# Patient Record
Sex: Female | Born: 1950 | Race: Black or African American | Hispanic: No | Marital: Single | Smoking: Never smoker
Health system: Southern US, Community
[De-identification: ages and names within clinical notes are randomized; demographics above are authoritative.]

## PROBLEM LIST (undated history)

## (undated) DIAGNOSIS — I739 Peripheral vascular disease, unspecified: Secondary | ICD-10-CM

## (undated) DIAGNOSIS — N390 Urinary tract infection, site not specified: Secondary | ICD-10-CM

## (undated) DIAGNOSIS — K219 Gastro-esophageal reflux disease without esophagitis: Secondary | ICD-10-CM

## (undated) DIAGNOSIS — I10 Essential (primary) hypertension: Secondary | ICD-10-CM

## (undated) DIAGNOSIS — E669 Obesity, unspecified: Secondary | ICD-10-CM

## (undated) DIAGNOSIS — E119 Type 2 diabetes mellitus without complications: Secondary | ICD-10-CM

## (undated) DIAGNOSIS — G5600 Carpal tunnel syndrome, unspecified upper limb: Secondary | ICD-10-CM

## (undated) DIAGNOSIS — M719 Bursopathy, unspecified: Secondary | ICD-10-CM

## (undated) DIAGNOSIS — M779 Enthesopathy, unspecified: Secondary | ICD-10-CM

## (undated) DIAGNOSIS — G43909 Migraine, unspecified, not intractable, without status migrainosus: Secondary | ICD-10-CM

## (undated) DIAGNOSIS — M1990 Unspecified osteoarthritis, unspecified site: Secondary | ICD-10-CM

## (undated) DIAGNOSIS — J189 Pneumonia, unspecified organism: Secondary | ICD-10-CM

## (undated) DIAGNOSIS — M5430 Sciatica, unspecified side: Secondary | ICD-10-CM

## (undated) HISTORY — DX: Enthesopathy, unspecified: M77.9

## (undated) HISTORY — DX: Gastro-esophageal reflux disease without esophagitis: K21.9

## (undated) HISTORY — DX: Essential (primary) hypertension: I10

## (undated) HISTORY — PX: GANGLION CYST EXCISION: SHX1691

## (undated) HISTORY — DX: Obesity, unspecified: E66.9

## (undated) HISTORY — DX: Unspecified osteoarthritis, unspecified site: M19.90

## (undated) HISTORY — PX: UTERINE FIBROID SURGERY: SHX826

---
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
## 1993-07-25 HISTORY — PX: PLANTAR FASCIA RELEASE: SHX2239

## 1998-03-26 ENCOUNTER — Emergency Department (HOSPITAL_COMMUNITY): Admission: EM | Admit: 1998-03-26 | Discharge: 1998-03-26 | Payer: Self-pay | Admitting: Emergency Medicine

## 1998-06-22 ENCOUNTER — Other Ambulatory Visit: Admission: RE | Admit: 1998-06-22 | Discharge: 1998-06-22 | Payer: Self-pay | Admitting: *Deleted

## 1999-06-12 ENCOUNTER — Emergency Department (HOSPITAL_COMMUNITY): Admission: EM | Admit: 1999-06-12 | Discharge: 1999-06-12 | Payer: Self-pay

## 1999-06-12 ENCOUNTER — Encounter: Payer: Self-pay | Admitting: Emergency Medicine

## 1999-07-21 ENCOUNTER — Other Ambulatory Visit: Admission: RE | Admit: 1999-07-21 | Discharge: 1999-07-21 | Payer: Self-pay | Admitting: *Deleted

## 2000-08-03 ENCOUNTER — Other Ambulatory Visit: Admission: RE | Admit: 2000-08-03 | Discharge: 2000-08-03 | Payer: Self-pay | Admitting: Orthopedic Surgery

## 2000-11-30 ENCOUNTER — Other Ambulatory Visit: Admission: RE | Admit: 2000-11-30 | Discharge: 2000-11-30 | Payer: Self-pay | Admitting: *Deleted

## 2001-02-01 ENCOUNTER — Other Ambulatory Visit: Admission: RE | Admit: 2001-02-01 | Discharge: 2001-02-01 | Payer: Self-pay | Admitting: *Deleted

## 2001-07-25 HISTORY — PX: KNEE ARTHROSCOPY: SHX127

## 2002-05-01 ENCOUNTER — Other Ambulatory Visit: Admission: RE | Admit: 2002-05-01 | Discharge: 2002-05-01 | Payer: Self-pay | Admitting: *Deleted

## 2002-05-28 ENCOUNTER — Ambulatory Visit: Admission: RE | Admit: 2002-05-28 | Discharge: 2002-05-28 | Payer: Self-pay | Admitting: Gynecology

## 2002-05-28 ENCOUNTER — Encounter (INDEPENDENT_AMBULATORY_CARE_PROVIDER_SITE_OTHER): Payer: Self-pay | Admitting: Specialist

## 2003-02-18 ENCOUNTER — Encounter: Payer: Self-pay | Admitting: *Deleted

## 2003-02-18 ENCOUNTER — Encounter: Admission: RE | Admit: 2003-02-18 | Discharge: 2003-02-18 | Payer: Self-pay | Admitting: *Deleted

## 2004-04-16 ENCOUNTER — Emergency Department (HOSPITAL_COMMUNITY): Admission: EM | Admit: 2004-04-16 | Discharge: 2004-04-16 | Payer: Self-pay | Admitting: Emergency Medicine

## 2004-06-03 ENCOUNTER — Ambulatory Visit: Payer: Self-pay | Admitting: Family Medicine

## 2004-06-15 ENCOUNTER — Ambulatory Visit: Payer: Self-pay | Admitting: Family Medicine

## 2004-07-06 ENCOUNTER — Ambulatory Visit: Payer: Self-pay | Admitting: Family Medicine

## 2004-07-21 ENCOUNTER — Ambulatory Visit: Payer: Self-pay | Admitting: Pulmonary Disease

## 2004-08-09 ENCOUNTER — Ambulatory Visit (HOSPITAL_COMMUNITY): Admission: RE | Admit: 2004-08-09 | Discharge: 2004-08-09 | Payer: Self-pay | Admitting: Pulmonary Disease

## 2004-08-13 ENCOUNTER — Ambulatory Visit: Payer: Self-pay | Admitting: Pulmonary Disease

## 2004-09-17 ENCOUNTER — Ambulatory Visit: Payer: Self-pay | Admitting: Family Medicine

## 2004-09-28 ENCOUNTER — Ambulatory Visit: Payer: Self-pay | Admitting: Family Medicine

## 2004-09-29 ENCOUNTER — Ambulatory Visit: Payer: Self-pay | Admitting: Pulmonary Disease

## 2004-11-09 ENCOUNTER — Ambulatory Visit: Payer: Self-pay | Admitting: Pulmonary Disease

## 2005-01-01 ENCOUNTER — Ambulatory Visit: Payer: Self-pay | Admitting: Family Medicine

## 2005-01-11 ENCOUNTER — Ambulatory Visit: Payer: Self-pay | Admitting: Family Medicine

## 2005-02-16 ENCOUNTER — Ambulatory Visit: Payer: Self-pay | Admitting: Pulmonary Disease

## 2005-03-08 ENCOUNTER — Ambulatory Visit: Payer: Self-pay | Admitting: Family Medicine

## 2005-07-28 ENCOUNTER — Ambulatory Visit: Payer: Self-pay | Admitting: Family Medicine

## 2005-08-23 ENCOUNTER — Other Ambulatory Visit: Admission: RE | Admit: 2005-08-23 | Discharge: 2005-08-23 | Payer: Self-pay | Admitting: *Deleted

## 2005-08-24 ENCOUNTER — Ambulatory Visit: Payer: Self-pay | Admitting: Family Medicine

## 2005-10-18 ENCOUNTER — Ambulatory Visit: Payer: Self-pay | Admitting: Family Medicine

## 2006-04-03 ENCOUNTER — Ambulatory Visit: Payer: Self-pay | Admitting: Family Medicine

## 2006-04-22 ENCOUNTER — Ambulatory Visit: Payer: Self-pay | Admitting: Family Medicine

## 2006-06-13 ENCOUNTER — Ambulatory Visit: Payer: Self-pay | Admitting: Family Medicine

## 2006-12-21 ENCOUNTER — Ambulatory Visit: Payer: Self-pay | Admitting: Internal Medicine

## 2006-12-25 ENCOUNTER — Ambulatory Visit: Payer: Self-pay | Admitting: *Deleted

## 2006-12-27 ENCOUNTER — Ambulatory Visit (HOSPITAL_COMMUNITY): Admission: RE | Admit: 2006-12-27 | Discharge: 2006-12-27 | Payer: Self-pay | Admitting: Internal Medicine

## 2007-01-11 ENCOUNTER — Encounter: Admission: RE | Admit: 2007-01-11 | Discharge: 2007-02-22 | Payer: Self-pay | Admitting: Internal Medicine

## 2007-01-17 ENCOUNTER — Ambulatory Visit (HOSPITAL_COMMUNITY): Admission: RE | Admit: 2007-01-17 | Discharge: 2007-01-17 | Payer: Self-pay | Admitting: Internal Medicine

## 2007-02-20 ENCOUNTER — Ambulatory Visit: Payer: Self-pay | Admitting: Internal Medicine

## 2007-04-09 ENCOUNTER — Ambulatory Visit: Payer: Self-pay | Admitting: Internal Medicine

## 2007-04-26 DIAGNOSIS — J309 Allergic rhinitis, unspecified: Secondary | ICD-10-CM | POA: Insufficient documentation

## 2007-04-26 DIAGNOSIS — I10 Essential (primary) hypertension: Secondary | ICD-10-CM

## 2007-04-26 DIAGNOSIS — E1159 Type 2 diabetes mellitus with other circulatory complications: Secondary | ICD-10-CM | POA: Insufficient documentation

## 2007-04-26 DIAGNOSIS — M779 Enthesopathy, unspecified: Secondary | ICD-10-CM | POA: Insufficient documentation

## 2007-04-26 DIAGNOSIS — M25569 Pain in unspecified knee: Secondary | ICD-10-CM | POA: Insufficient documentation

## 2007-04-26 DIAGNOSIS — J45909 Unspecified asthma, uncomplicated: Secondary | ICD-10-CM | POA: Insufficient documentation

## 2007-04-26 DIAGNOSIS — K219 Gastro-esophageal reflux disease without esophagitis: Secondary | ICD-10-CM | POA: Insufficient documentation

## 2007-04-27 ENCOUNTER — Telehealth (INDEPENDENT_AMBULATORY_CARE_PROVIDER_SITE_OTHER): Payer: Self-pay | Admitting: *Deleted

## 2007-05-21 ENCOUNTER — Ambulatory Visit: Payer: Self-pay | Admitting: Emergency Medicine

## 2007-06-06 ENCOUNTER — Encounter: Payer: Self-pay | Admitting: Family Medicine

## 2007-06-12 ENCOUNTER — Ambulatory Visit: Payer: Self-pay | Admitting: Internal Medicine

## 2007-06-12 DIAGNOSIS — M79609 Pain in unspecified limb: Secondary | ICD-10-CM | POA: Insufficient documentation

## 2007-07-05 ENCOUNTER — Telehealth (INDEPENDENT_AMBULATORY_CARE_PROVIDER_SITE_OTHER): Payer: Self-pay | Admitting: Internal Medicine

## 2007-07-10 ENCOUNTER — Encounter (INDEPENDENT_AMBULATORY_CARE_PROVIDER_SITE_OTHER): Payer: Self-pay | Admitting: Internal Medicine

## 2007-07-17 ENCOUNTER — Encounter (INDEPENDENT_AMBULATORY_CARE_PROVIDER_SITE_OTHER): Payer: Self-pay | Admitting: Internal Medicine

## 2007-07-25 ENCOUNTER — Ambulatory Visit (HOSPITAL_COMMUNITY): Admission: RE | Admit: 2007-07-25 | Discharge: 2007-07-25 | Payer: Self-pay | Admitting: Internal Medicine

## 2007-08-04 ENCOUNTER — Encounter (INDEPENDENT_AMBULATORY_CARE_PROVIDER_SITE_OTHER): Payer: Self-pay | Admitting: Internal Medicine

## 2007-08-31 ENCOUNTER — Telehealth: Payer: Self-pay | Admitting: Pulmonary Disease

## 2007-09-07 ENCOUNTER — Telehealth (INDEPENDENT_AMBULATORY_CARE_PROVIDER_SITE_OTHER): Payer: Self-pay | Admitting: Internal Medicine

## 2007-09-17 ENCOUNTER — Ambulatory Visit: Payer: Self-pay | Admitting: Pulmonary Disease

## 2007-09-17 DIAGNOSIS — J209 Acute bronchitis, unspecified: Secondary | ICD-10-CM | POA: Insufficient documentation

## 2007-11-20 ENCOUNTER — Ambulatory Visit: Payer: Self-pay | Admitting: Internal Medicine

## 2007-11-20 ENCOUNTER — Ambulatory Visit: Payer: Self-pay | Admitting: Pulmonary Disease

## 2007-12-18 ENCOUNTER — Ambulatory Visit: Payer: Self-pay | Admitting: Pulmonary Disease

## 2008-01-04 ENCOUNTER — Telehealth (INDEPENDENT_AMBULATORY_CARE_PROVIDER_SITE_OTHER): Payer: Self-pay | Admitting: *Deleted

## 2008-01-31 ENCOUNTER — Telehealth (INDEPENDENT_AMBULATORY_CARE_PROVIDER_SITE_OTHER): Payer: Self-pay | Admitting: Internal Medicine

## 2008-02-12 ENCOUNTER — Encounter (INDEPENDENT_AMBULATORY_CARE_PROVIDER_SITE_OTHER): Payer: Self-pay | Admitting: *Deleted

## 2008-02-19 ENCOUNTER — Ambulatory Visit: Payer: Self-pay | Admitting: Internal Medicine

## 2008-02-19 ENCOUNTER — Telehealth (INDEPENDENT_AMBULATORY_CARE_PROVIDER_SITE_OTHER): Payer: Self-pay | Admitting: *Deleted

## 2008-02-21 ENCOUNTER — Telehealth (INDEPENDENT_AMBULATORY_CARE_PROVIDER_SITE_OTHER): Payer: Self-pay | Admitting: Internal Medicine

## 2008-02-21 ENCOUNTER — Emergency Department (HOSPITAL_COMMUNITY): Admission: EM | Admit: 2008-02-21 | Discharge: 2008-02-21 | Payer: Self-pay | Admitting: Emergency Medicine

## 2008-02-22 ENCOUNTER — Telehealth (INDEPENDENT_AMBULATORY_CARE_PROVIDER_SITE_OTHER): Payer: Self-pay | Admitting: Internal Medicine

## 2008-03-03 ENCOUNTER — Ambulatory Visit: Payer: Self-pay | Admitting: Pulmonary Disease

## 2008-03-04 ENCOUNTER — Telehealth (INDEPENDENT_AMBULATORY_CARE_PROVIDER_SITE_OTHER): Payer: Self-pay | Admitting: *Deleted

## 2008-03-04 DIAGNOSIS — K029 Dental caries, unspecified: Secondary | ICD-10-CM | POA: Insufficient documentation

## 2008-03-26 ENCOUNTER — Telehealth (INDEPENDENT_AMBULATORY_CARE_PROVIDER_SITE_OTHER): Payer: Self-pay | Admitting: *Deleted

## 2008-04-01 ENCOUNTER — Telehealth: Payer: Self-pay | Admitting: Pulmonary Disease

## 2008-04-10 ENCOUNTER — Ambulatory Visit: Payer: Self-pay | Admitting: Internal Medicine

## 2008-04-18 ENCOUNTER — Ambulatory Visit: Payer: Self-pay | Admitting: Internal Medicine

## 2008-04-18 ENCOUNTER — Ambulatory Visit (HOSPITAL_COMMUNITY): Admission: RE | Admit: 2008-04-18 | Discharge: 2008-04-18 | Payer: Self-pay | Admitting: Internal Medicine

## 2008-05-18 ENCOUNTER — Encounter (INDEPENDENT_AMBULATORY_CARE_PROVIDER_SITE_OTHER): Payer: Self-pay | Admitting: Internal Medicine

## 2008-05-30 ENCOUNTER — Telehealth (INDEPENDENT_AMBULATORY_CARE_PROVIDER_SITE_OTHER): Payer: Self-pay | Admitting: Internal Medicine

## 2008-07-03 ENCOUNTER — Ambulatory Visit: Payer: Self-pay | Admitting: Internal Medicine

## 2008-07-03 DIAGNOSIS — J019 Acute sinusitis, unspecified: Secondary | ICD-10-CM | POA: Insufficient documentation

## 2008-07-04 ENCOUNTER — Telehealth (INDEPENDENT_AMBULATORY_CARE_PROVIDER_SITE_OTHER): Payer: Self-pay | Admitting: *Deleted

## 2008-07-08 ENCOUNTER — Telehealth: Payer: Self-pay | Admitting: Adult Health

## 2008-07-10 ENCOUNTER — Ambulatory Visit: Payer: Self-pay | Admitting: Internal Medicine

## 2008-07-10 DIAGNOSIS — I872 Venous insufficiency (chronic) (peripheral): Secondary | ICD-10-CM | POA: Insufficient documentation

## 2008-07-10 DIAGNOSIS — D259 Leiomyoma of uterus, unspecified: Secondary | ICD-10-CM | POA: Insufficient documentation

## 2008-07-14 ENCOUNTER — Telehealth (INDEPENDENT_AMBULATORY_CARE_PROVIDER_SITE_OTHER): Payer: Self-pay | Admitting: Internal Medicine

## 2008-07-14 LAB — CONVERTED CEMR LAB
CO2: 24 meq/L (ref 19–32)
Chloride: 104 meq/L (ref 96–112)
Potassium: 3.9 meq/L (ref 3.5–5.3)

## 2008-07-31 ENCOUNTER — Telehealth (INDEPENDENT_AMBULATORY_CARE_PROVIDER_SITE_OTHER): Payer: Self-pay | Admitting: Internal Medicine

## 2008-08-05 ENCOUNTER — Telehealth (INDEPENDENT_AMBULATORY_CARE_PROVIDER_SITE_OTHER): Payer: Self-pay | Admitting: *Deleted

## 2008-08-29 ENCOUNTER — Telehealth: Payer: Self-pay | Admitting: Adult Health

## 2008-09-01 ENCOUNTER — Telehealth (INDEPENDENT_AMBULATORY_CARE_PROVIDER_SITE_OTHER): Payer: Self-pay | Admitting: *Deleted

## 2008-10-02 ENCOUNTER — Ambulatory Visit: Payer: Self-pay | Admitting: Internal Medicine

## 2008-10-02 LAB — CONVERTED CEMR LAB
Albumin: 4.1 g/dL (ref 3.5–5.2)
Basophils Relative: 0 % (ref 0–1)
Bilirubin Urine: NEGATIVE
Blood in Urine, dipstick: NEGATIVE
CO2: 24 meq/L (ref 19–32)
Cholesterol: 171 mg/dL (ref 0–200)
Eosinophils Relative: 1 % (ref 0–5)
Glucose, Bld: 95 mg/dL (ref 70–99)
HCT: 41.7 % (ref 36.0–46.0)
Hemoglobin: 13.8 g/dL (ref 12.0–15.0)
Ketones, urine, test strip: NEGATIVE
MCHC: 33.1 g/dL (ref 30.0–36.0)
MCV: 89.1 fL (ref 78.0–100.0)
Monocytes Absolute: 0.6 10*3/uL (ref 0.1–1.0)
Monocytes Relative: 5 % (ref 3–12)
Neutro Abs: 8.2 10*3/uL — ABNORMAL HIGH (ref 1.7–7.7)
Protein, U semiquant: 100
RDW: 14.4 % (ref 11.5–15.5)
Sodium: 139 meq/L (ref 135–145)
Total Bilirubin: 0.5 mg/dL (ref 0.3–1.2)
Total Protein: 8.9 g/dL — ABNORMAL HIGH (ref 6.0–8.3)
Triglycerides: 132 mg/dL (ref <150)
Urobilinogen, UA: 0.2
VLDL: 26 mg/dL (ref 0–40)
pH: 5

## 2008-10-12 ENCOUNTER — Encounter (INDEPENDENT_AMBULATORY_CARE_PROVIDER_SITE_OTHER): Payer: Self-pay | Admitting: Internal Medicine

## 2008-11-11 ENCOUNTER — Telehealth (INDEPENDENT_AMBULATORY_CARE_PROVIDER_SITE_OTHER): Payer: Self-pay | Admitting: *Deleted

## 2008-12-01 ENCOUNTER — Ambulatory Visit: Payer: Self-pay | Admitting: Pulmonary Disease

## 2008-12-02 ENCOUNTER — Telehealth (INDEPENDENT_AMBULATORY_CARE_PROVIDER_SITE_OTHER): Payer: Self-pay | Admitting: *Deleted

## 2008-12-03 ENCOUNTER — Telehealth (INDEPENDENT_AMBULATORY_CARE_PROVIDER_SITE_OTHER): Payer: Self-pay | Admitting: Internal Medicine

## 2008-12-04 ENCOUNTER — Telehealth (INDEPENDENT_AMBULATORY_CARE_PROVIDER_SITE_OTHER): Payer: Self-pay | Admitting: *Deleted

## 2008-12-12 ENCOUNTER — Telehealth (INDEPENDENT_AMBULATORY_CARE_PROVIDER_SITE_OTHER): Payer: Self-pay | Admitting: *Deleted

## 2008-12-28 ENCOUNTER — Telehealth: Payer: Self-pay | Admitting: Internal Medicine

## 2009-01-01 ENCOUNTER — Telehealth (INDEPENDENT_AMBULATORY_CARE_PROVIDER_SITE_OTHER): Payer: Self-pay | Admitting: Internal Medicine

## 2009-01-06 ENCOUNTER — Ambulatory Visit: Payer: Self-pay | Admitting: Internal Medicine

## 2009-01-06 DIAGNOSIS — R5381 Other malaise: Secondary | ICD-10-CM | POA: Insufficient documentation

## 2009-01-06 DIAGNOSIS — M7700 Medial epicondylitis, unspecified elbow: Secondary | ICD-10-CM | POA: Insufficient documentation

## 2009-01-06 DIAGNOSIS — R5383 Other fatigue: Secondary | ICD-10-CM | POA: Insufficient documentation

## 2009-03-10 ENCOUNTER — Encounter (INDEPENDENT_AMBULATORY_CARE_PROVIDER_SITE_OTHER): Payer: Self-pay | Admitting: Internal Medicine

## 2009-03-24 ENCOUNTER — Ambulatory Visit: Payer: Self-pay | Admitting: Pulmonary Disease

## 2009-04-09 ENCOUNTER — Ambulatory Visit: Payer: Self-pay | Admitting: Internal Medicine

## 2009-04-17 ENCOUNTER — Telehealth: Payer: Self-pay | Admitting: Pulmonary Disease

## 2009-05-25 ENCOUNTER — Telehealth (INDEPENDENT_AMBULATORY_CARE_PROVIDER_SITE_OTHER): Payer: Self-pay | Admitting: Internal Medicine

## 2009-07-02 ENCOUNTER — Telehealth (INDEPENDENT_AMBULATORY_CARE_PROVIDER_SITE_OTHER): Payer: Self-pay | Admitting: Internal Medicine

## 2009-09-03 ENCOUNTER — Ambulatory Visit: Payer: Self-pay | Admitting: Internal Medicine

## 2009-09-03 DIAGNOSIS — M19079 Primary osteoarthritis, unspecified ankle and foot: Secondary | ICD-10-CM | POA: Insufficient documentation

## 2009-10-30 ENCOUNTER — Telehealth (INDEPENDENT_AMBULATORY_CARE_PROVIDER_SITE_OTHER): Payer: Self-pay | Admitting: Internal Medicine

## 2010-01-26 ENCOUNTER — Telehealth (INDEPENDENT_AMBULATORY_CARE_PROVIDER_SITE_OTHER): Payer: Self-pay | Admitting: Internal Medicine

## 2010-02-09 ENCOUNTER — Encounter (INDEPENDENT_AMBULATORY_CARE_PROVIDER_SITE_OTHER): Payer: Self-pay | Admitting: *Deleted

## 2010-02-10 ENCOUNTER — Encounter: Payer: Self-pay | Admitting: Pulmonary Disease

## 2010-02-10 ENCOUNTER — Telehealth: Payer: Self-pay | Admitting: Pulmonary Disease

## 2010-03-09 ENCOUNTER — Telehealth: Payer: Self-pay | Admitting: Pulmonary Disease

## 2010-03-26 ENCOUNTER — Telehealth (INDEPENDENT_AMBULATORY_CARE_PROVIDER_SITE_OTHER): Payer: Self-pay | Admitting: *Deleted

## 2010-04-28 ENCOUNTER — Ambulatory Visit: Payer: Self-pay | Admitting: Physician Assistant

## 2010-05-05 ENCOUNTER — Encounter (INDEPENDENT_AMBULATORY_CARE_PROVIDER_SITE_OTHER): Payer: Self-pay | Admitting: Internal Medicine

## 2010-05-05 DIAGNOSIS — E113299 Type 2 diabetes mellitus with mild nonproliferative diabetic retinopathy without macular edema, unspecified eye: Secondary | ICD-10-CM | POA: Insufficient documentation

## 2010-05-05 DIAGNOSIS — E11319 Type 2 diabetes mellitus with unspecified diabetic retinopathy without macular edema: Secondary | ICD-10-CM | POA: Insufficient documentation

## 2010-05-13 ENCOUNTER — Telehealth (INDEPENDENT_AMBULATORY_CARE_PROVIDER_SITE_OTHER): Payer: Self-pay | Admitting: Internal Medicine

## 2010-05-19 ENCOUNTER — Encounter (INDEPENDENT_AMBULATORY_CARE_PROVIDER_SITE_OTHER): Payer: Self-pay | Admitting: *Deleted

## 2010-05-20 ENCOUNTER — Telehealth (INDEPENDENT_AMBULATORY_CARE_PROVIDER_SITE_OTHER): Payer: Self-pay | Admitting: Internal Medicine

## 2010-06-03 ENCOUNTER — Ambulatory Visit: Payer: Self-pay | Admitting: Internal Medicine

## 2010-06-03 LAB — CONVERTED CEMR LAB
ALT: 17 units/L (ref 0–35)
AST: 16 units/L (ref 0–37)
Basophils Absolute: 0 10*3/uL (ref 0.0–0.1)
Basophils Relative: 0 % (ref 0–1)
Creatinine, Ser: 0.86 mg/dL (ref 0.40–1.20)
Eosinophils Absolute: 0.2 10*3/uL (ref 0.0–0.7)
Eosinophils Relative: 2 % (ref 0–5)
HCT: 41.3 % (ref 36.0–46.0)
Hemoglobin: 14.1 g/dL (ref 12.0–15.0)
MCHC: 34.1 g/dL (ref 30.0–36.0)
Monocytes Absolute: 0.8 10*3/uL (ref 0.1–1.0)
RDW: 13.5 % (ref 11.5–15.5)
Total Bilirubin: 0.3 mg/dL (ref 0.3–1.2)

## 2010-06-14 ENCOUNTER — Telehealth (INDEPENDENT_AMBULATORY_CARE_PROVIDER_SITE_OTHER): Payer: Self-pay | Admitting: Internal Medicine

## 2010-06-14 DIAGNOSIS — R7309 Other abnormal glucose: Secondary | ICD-10-CM | POA: Insufficient documentation

## 2010-06-16 ENCOUNTER — Encounter (INDEPENDENT_AMBULATORY_CARE_PROVIDER_SITE_OTHER): Payer: Self-pay | Admitting: Internal Medicine

## 2010-06-20 ENCOUNTER — Telehealth (INDEPENDENT_AMBULATORY_CARE_PROVIDER_SITE_OTHER): Payer: Self-pay | Admitting: Internal Medicine

## 2010-06-20 ENCOUNTER — Encounter (INDEPENDENT_AMBULATORY_CARE_PROVIDER_SITE_OTHER): Payer: Self-pay | Admitting: Internal Medicine

## 2010-06-21 ENCOUNTER — Encounter (INDEPENDENT_AMBULATORY_CARE_PROVIDER_SITE_OTHER): Payer: Self-pay | Admitting: Internal Medicine

## 2010-08-09 ENCOUNTER — Encounter (INDEPENDENT_AMBULATORY_CARE_PROVIDER_SITE_OTHER): Payer: Self-pay | Admitting: Internal Medicine

## 2010-08-17 ENCOUNTER — Ambulatory Visit
Admission: RE | Admit: 2010-08-17 | Discharge: 2010-08-17 | Payer: Self-pay | Source: Home / Self Care | Attending: Internal Medicine | Admitting: Internal Medicine

## 2010-08-17 ENCOUNTER — Encounter (INDEPENDENT_AMBULATORY_CARE_PROVIDER_SITE_OTHER): Payer: Self-pay | Admitting: Internal Medicine

## 2010-08-17 LAB — CONVERTED CEMR LAB: Hgb A1c MFr Bld: 6.8 %

## 2010-08-24 NOTE — Progress Notes (Signed)
Summary: HAVING SIDE EFFECTS WITH ARTHRETIC MED  Phone Note Call from Patient Call back at Home Phone [phone]   Summary of Call: Mariah [name] PT. MS Rolf SAYS THAT TAKING THE ARTHROTEC AND ITS CAUSING HER STOMACH TO REAL BAD AND GIVES HER EXTREM GAS. SHE WANTS TO KNOW IF SHE CAN SWITCHED IBUPROFEN 800MG .  AND SHE SAYS THAT SHE NEVER RECEIVED A CALL ABOUT HER THERAPY.   SHE USES [facility]. SHE WANTS TO KNOW IF SHE'LL HAVE TO WAIT 3-4 DAYS TO GET THIS AT OUR PHARM. Initial call taken by: Leodis Rains,  October 30, 2009 12:56 PM  Follow-up for Phone Call        Pt requesting to be changed to ibuprofen due the bad gas she gets from the Arthrotec.   Follow-up by: Vesta Mixer CMA,  October 30, 2009 4:29 PM  Additional Follow-up for Phone Call Additional follow up Details #1::        I sent an Rx for Diclofenac--I would like her to try this--to [facility] [address]  See if this is better.  It should cost only $4. Additional Follow-up by: Julieanne Manson MD,  November 03, 2009 7:02 PM    Additional Follow-up for Phone Call Additional follow up Details #2::    Pt notified and is c/o of some vaginal itching, no discharge.  She would like a diflucan called to [facility] pharmacy. Follow-up by: Vesta Mixer CMA,  November 10, 2009 11:25 AM  Additional Follow-up for Phone Call Additional follow up Details #3:: Details for Additional Follow-up Action Taken: needs a wet prep Tereso Newcomer PA-C  November 10, 2009 1:32 PM  line busy...Marland KitchenMarland KitchenMarland Kitchen Tiffany McCoy CMA  November 11, 2009 9:30 AM   Pt aware and states she will try and otc yeast medication first and then if no better will try and come in for a wet prep............ Tiffany McCoy CMA  November 11, 2009 12:25 PM   New/Updated Medications: DICLOFENAC SODIUM 75 MG TBEC (DICLOFENAC SODIUM) 1 tab by mouth two times a day as needed pain Prescriptions: DICLOFENAC SODIUM 75 MG TBEC (DICLOFENAC SODIUM) 1 tab by mouth two times a day as needed pain  #60 x 4   Entered  and Authorized by:   Julieanne Manson MD   Signed by:   Julieanne Manson MD on 11/03/2009   Method used:   Electronically to        [facility] [phone]* (retail)       [address]       [place], [place]  [address]       Ph: [phone]       Fax: [phone]   RxID:   [id number]

## 2010-08-24 NOTE — Progress Notes (Signed)
Summary: LETTER  Phone Note Call from Patient Call back at [phone]   Caller: Patient Call For: [name] Summary of Call: NEED LETTER STATING SHE IS A RESPIRATORY PT TO HELP HER GET ASSISTANCE GETTING AIR CONDITION Initial call taken by: Rickard Patience,  February 10, 2010 11:04 AM  Follow-up for Phone Call        called and spoke with pt--she stated that her cooling unit is out and she is requesting a letter stating that she is a respiratory patient so they can move her up on the list to get an air conditioner.  she has 300 people ahead of her.  thanks  Additional Follow-up for Phone Call Additional follow up Details #1::        we have not seen he rin 1 year ! O to give generic letter -' is under our care for asthma. Pl help in whatever manner possible.' Additional Follow-up by: Comer Locket. Vassie Loll MD,  February 10, 2010 11:50 AM    Additional Follow-up for Phone Call Additional follow up Details #2::    [facility] advising pt letter is in the mail. Zackery Barefoot CMA  February 10, 2010 5:08 PM

## 2010-08-24 NOTE — Progress Notes (Signed)
Summary: MEDS REFILL  Phone Note Call from Patient Call back at [phone] OR [phone]   Summary of Call: PLEASE CALL PATIENT SHE NEED MEDICINE  HER APP WAS TODAY RESCHEDULE FOR 11.10.11 AT 3:15 PM LAST MEDECINE IS NOT WORKING.  Initial call taken by: Domenic Polite,  May 13, 2010 3:58 PM  Follow-up for Phone Call        Please call first thing in morning and find out what symptoms she is still having and what is improved Follow-up by: Julieanne Manson MD,  May 13, 2010 11:43 PM  Additional Follow-up for Phone Call Additional follow up Details #1::        [facility]  May 14, 2010 8:50 AM  [facility] Michelle Nasuti  May 18, 2010 5:21 PM     Additional Follow-up for Phone Call Additional follow up Details #2::    Left message on answering machine for pt to call back....will mail letter..Armenia Shannon  May 19, 2010 12:32 PM

## 2010-08-24 NOTE — Miscellaneous (Signed)
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

## 2010-08-24 NOTE — Letter (Signed)
Summary: [facility]  [address]   [place], [place] [address]   Phone: [phone]  Fax: [phone]    02/10/2010  [facility] [address] [place], [place]  [address]   TO WHOM IT MAY CONCERN:  The above named is under our care for asthma. Please help in whatever   manner possible. For any questions or concerns please contact me or my   staff.          Sincerely,    Cyril Mourning, MD

## 2010-08-24 NOTE — Assessment & Plan Note (Signed)
Summary: 2 week fu for bp and asthma//kt   Vital Signs:  Patient profile:   60 year old female Weight:      229.19 pounds O2 Sat:      96 % on Room air Temp:     97 degrees F oral Pulse rate:   94 / minute Pulse rhythm:   regular Resp:     18 per minute BP sitting:   154 / 98  (left arm) Cuff size:   regular  Vitals Entered By: Hale Drone CMA (June 03, 2010 3:08 PM)  O2 Flow:  Room air CC: Pt. is here for a 2 week f/u on her BP and Asthma. Pt. is complaining of left wrist pain, some nasal congestion and rattling chest. She is also complaining of ingrown toe nail pain. Pt has an appt. on 28 of November @ [facility].  Is Patient Diabetic? No Pain Assessment Patient in pain? yes     Location: Wrist Intensity: 5@daytime  9@night  time Type: stinging Onset of pain  With activity  Does patient need assistance? Functional Status Self care Ambulation Normal Comments Pk Flow: 350 - 390 - 370   Primary Care Kaye Mitro:  Julieanne Manson MD  CC:  Pt. is here for a 2 week f/u on her BP and Asthma. Pt. is complaining of left wrist pain and some nasal congestion and rattling chest. She is also complaining of ingrown toe nail pain. Pt has an appt. on 28 of November @ [facility]. [name]  History of Present Illness: 1.  Asthma:  Using Advair twice daily.  Meds all dated 10/5, but pt. was not able to pick up until 10/11.    Admits that she may be missing meds as she is living at [employer] (shelter)  and life is not easy.    2.  Allergies:  Does not appear she is using Nasocort.  She does not have with her today as well.  Still having congestion asn sinus symptoms.    3.  Hypertension:  missing meds a bit still.  4.  Bilateral knee pain and tennis elbow:  Still has not received Celebrex--filled 10/28.  Pt. states she checked at pharmacy since and was not there.  5.  Preventive Health--did not get Mammogram yet this year.  Has not had flu vaccine.    Current Medications (verified): 1)   Norvasc 5 Mg  Tabs (Amlodipine Besylate) .[name].. 1 Tab By Mouth Daily 2)  Furosemide 40 Mg  Tabs (Furosemide) .... 2 Tabs By Mouth Every Morning and 1/2 Tablet At Noon 3)  Catapres 0.2 Mg  Tabs (Clonidine Hcl) .[name].. 1 Tab By Mouth Two Times A Day 4)  Allegra-D 24 Hour 180-240 Mg Xr24h-Tab (Fexofenadine-Pseudoephedrine) .[name].. 1 Tab By Mouth Daily For Allergies 5)  Ventolin Hfa 108 (90 Base) Mcg/act  Aers (Albuterol Sulfate) .... 2 Puffs Every 4 Hours As Needed Shortness of Breath 6)  Aciphex 20 Mg Tbec (Rabeprazole Sodium) .[name].. 1 By Mouth Once Daily 7)  Advair Diskus 100-50 Mcg/dose Misc (Fluticasone-Salmeterol) .[name].. 1 Inhalation Two Times A Day 8)  Cvs Nasal Spray 0.05 % Soln (Oxymetazoline Hcl) .... 2 Sprays in Each Nostril Two Times A Day As Needed 9)  Air Cast Arm Splint .... Left Medial Epidondylitis 726.31 10)  Prenatal Vitamins 0.8 Mg Tabs (Prenatal Multivit-Min-Fe-Fa) .[name].. 1 Tab By Mouth Daily 11)  Flexeril 10 Mg Tabs (Cyclobenzaprine Hcl) .[name].. 1 Tab By Mouth Every 8 Hours As Needed For Back Pain--One Time Fill 12)  Nasacort Aq  55 Mcg/act Aers (Triamcinolone Acetonide) .... 2 Sprays Each Nostril Once Daily 13)  Zithromax Z-Pak 250 Mg Tabs (Azithromycin) .... Take As Directed 14)  Tobrex 0.3 % Soln (Tobramycin Sulfate) .[name].. 1-2 Drops Every 4 Hours Until Improvement/resolved 15)  Celebrex 200 Mg Caps (Celecoxib) .[name].. 1 Cap By Mouth Daily As Needed Pain  Allergies (verified): 1)  ! Pcn 2)  ! Codeine 3)  ! Doxycycline  Physical Exam  General:  obese, NAD Eyes:  No corneal or conjunctival inflammation noted. EOMI. Perrla. Funduscopic exam benign, without hemorrhages, exudates or papilledema. Vision grossly normal. Ears:  External ear exam shows no significant lesions or deformities.  Otoscopic examination reveals clear canals, tympanic membranes are intact bilaterally without bulging, retraction, inflammation or discharge. Hearing is grossly normal bilaterally. Nose:  mild swelling and clear  discharge Mouth:  pharynx pink and moist.   Lungs:  Normal respiratory effort, chest expands symmetrically. Lungs are clear to auscultation, no crackles or wheezes. Heart:  Normal rate and regular rhythm. S1 and S2 normal without gallop, murmur, click, rub or other extra sounds.  Radial pulses normal and equal   Impression & Recommendations:  Problem # 1:  MEDIAL EPICONDYLITIS, LEFT (ICD-726.31) Celebrex filled again--pt. usually controlled with this.  Problem # 2:  HYPERTENSION (ICD-401.9) To take meds regularly Her updated medication list for this problem includes:    Norvasc 5 Mg Tabs (Amlodipine besylate) .[name]... 1 tab by mouth daily    Furosemide 40 Mg Tabs (Furosemide) .[name]... 2 tabs by mouth every morning and 1/2 tablet at noon    Catapres 0.2 Mg Tabs (Clonidine hcl) .[name]... 1 tab by mouth two times a day  Problem # 3:  ASTHMA (ICD-493.90) Again, discussed need to stay on meds. Her updated medication list for this problem includes:    Ventolin Hfa 108 (90 Base) Mcg/act Aers (Albuterol sulfate) .[name]... 2 puffs every 4 hours as needed shortness of breath    Advair Diskus 100-50 Mcg/dose Misc (Fluticasone-salmeterol) .[name]... 1 inhalation two times a day  Orders: Peak Flow Rate ([address]) Pulse Oximetry (single measurment) (94760)  Problem # 4:  RHINITIS, ALLERGIC NEC (ICD-477.8) To get started again on Nasocort.  Problem # 5:  Preventive Health Care (ICD-V70.0) Flu vaccine and Mammogram scheduled.  Complete Medication List: 1)  Norvasc 5 Mg Tabs (Amlodipine besylate) .[name].. 1 tab by mouth daily 2)  Furosemide 40 Mg Tabs (Furosemide) .... 2 tabs by mouth every morning and 1/2 tablet at noon 3)  Catapres 0.2 Mg Tabs (Clonidine hcl) .[name].. 1 tab by mouth two times a day 4)  Xyzal 5 Mg Tabs (Levocetirizine dihydrochloride) .[name].. 1 tab by mouth daily 5)  Ventolin Hfa 108 (90 Base) Mcg/act Aers (Albuterol sulfate) .... 2 puffs every 4 hours as needed shortness of breath 6)  Aciphex 20 Mg Tbec  (Rabeprazole sodium) .[name].. 1 by mouth once daily 7)  Advair Diskus 100-50 Mcg/dose Misc (Fluticasone-salmeterol) .[name].. 1 inhalation two times a day 8)  Air Cast Arm Splint  .... Left medial epidondylitis 726.31 9)  Prenatal Vitamins 0.8 Mg Tabs (Prenatal multivit-min-fe-fa) .[name].. 1 tab by mouth daily 10)  Flexeril 10 Mg Tabs (Cyclobenzaprine hcl) .[name].. 1 tab by mouth every 8 hours as needed for back pain--one time fill 11)  Nasacort Aq 55 Mcg/act Aers (Triamcinolone acetonide) .... 2 sprays each nostril once daily 12)  Celebrex 200 Mg Caps (Celecoxib) .[name].. 1 cap by mouth daily as needed pain  Other Orders: T-Comprehensive Metabolic Panel ([address]98-11914) T-CBC w/Diff (78295-62130) Flu Vaccine  56yrs + [phone]) Admin 1st Vaccine ([address]) Mammogram (Screening) (Mammo)  Patient Instructions: 1)  Schedule for CPP in 4 months with Dr. Delrae Alfred 2)  BP check --nurse visit in 1 month 3)  Don't miss your medication Prescriptions: CELEBREX 200 MG CAPS (CELECOXIB) 1 cap by mouth daily as needed pain  #30 x 4   Entered and Authorized by:   Julieanne Manson MD   Signed by:   Julieanne Manson MD on 06/03/2010   Method used:   Faxed to ...       [facility] (retail)       [address] [place], [place]  [address]       Ph: [phone]       Fax: [phone]   RxID:   [id number] NASACORT AQ 55 MCG/ACT AERS (TRIAMCINOLONE ACETONIDE) 2 sprays each nostril once daily  #1 x 11   Entered and Authorized by:   Julieanne Manson MD   Signed by:   Julieanne Manson MD on 06/03/2010   Method used:   Faxed to ...       [facility] - Pharmac (retail)       [address] [place], [place]  [address]       Ph: [phone]       Fax: [phone]   RxID:   [id number] ADVAIR DISKUS 100-50 MCG/DOSE MISC (FLUTICASONE-SALMETEROL) 1 inhalation two times a day  #1 x 11   Entered and Authorized by:   Julieanne Manson MD    Signed by:   Julieanne Manson MD on 06/03/2010   Method used:   Faxed to ...       [facility] - Pharmac (retail)       [address] [place], [place]  [address]       Ph: [phone]       Fax: [phone]   RxID:   [id number] ACIPHEX 20 MG TBEC (RABEPRAZOLE SODIUM) 1 by mouth once daily  #30 x 11   Entered and Authorized by:   Julieanne Manson MD   Signed by:   Julieanne Manson MD on 06/03/2010   Method used:   Faxed to ...       [facility] - Pharmac (retail)       [address] [place], [place]  [address]       Ph: [phone]       Fax: [phone]   RxID:   [id number] CATAPRES 0.2 MG  TABS (CLONIDINE HCL) 1 tab by mouth two times a day  #60 Each x 11   Entered and Authorized by:   Julieanne Manson MD   Signed by:   Julieanne Manson MD on 06/03/2010   Method used:   Faxed to ...       [facility] - Pharmac (retail)       [address] [place], [place]  [address]       Ph: [phone]       Fax: [phone]   RxID:   [id number] FUROSEMIDE 40 MG  TABS (FUROSEMIDE) 2 tabs by mouth every morning and 1/2 tablet at noon  #75 x 11   Entered and Authorized by:   Julieanne Manson MD   Signed by:   Julieanne Manson MD on 06/03/2010  Method used:   Faxed to ...       [facility] - Pharmac (retail)       [address] [place], [place]  [address]       Ph: [phone]       Fax: [phone]   RxID:   [id number] NORVASC 5 MG  TABS (AMLODIPINE BESYLATE) 1 tab by mouth daily  #30 x 11   Entered and Authorized by:   Julieanne Manson MD   Signed by:   Julieanne Manson MD on 06/03/2010   Method used:   Faxed to ...       [facility] - Pharmac (retail)       [address] [place], [place]  [address]       Ph: [phone]       Fax: [phone]   RxID:    [id number] XYZAL 5 MG TABS (LEVOCETIRIZINE DIHYDROCHLORIDE) 1 tab by mouth daily  #30 x 11   Entered and Authorized by:   Julieanne Manson MD   Signed by:   Julieanne Manson MD on 06/03/2010   Method used:   Faxed to ...       [facility] - Pharmac (retail)       [address] [place], [place]  [address]       Ph: [phone]       Fax: [phone]   RxID:   [id number]    Orders Added: 1)  T-Comprehensive Metabolic Panel [80053-22900] 2)  T-CBC w/Diff [71696-78938] 3)  Flu Vaccine 58yrs + [90[address]] 4)  Admin 1st Vaccine [90471] 5)  Mammogram (Screening) [Mammo] 6)  Peak Flow Rate [94[address]] 7)  Pulse Oximetry (single measurment) [94760] 8)  Est. Patient Level IV [10175]   Immunizations Administered:  Influenza Vaccine # 1:    Vaccine Type: Fluvax 3+    Site: left deltoid    Mfr: GlaxoSmithKline    Dose: 0.5 ml    Route: IM    Given by: Hale Drone CMA    Exp. Date: 01/22/2011    Lot #: [id number]    VIS given: 02/16/10 version given June 03, 2010.  Flu Vaccine Consent Questions:    Do you have a history of severe allergic reactions to this vaccine? no    Any prior history of allergic reactions to egg and/or gelatin? no    Do you have a sensitivity to the preservative Thimersol? no    Do you have a past history of Guillan-Barre Syndrome? no    Do you currently have an acute febrile illness? no    Have you ever had a severe reaction to latex? no    Vaccine information given and explained to patient? yes    Are you currently pregnant? no   Immunizations Administered:  Influenza Vaccine # 1:    Vaccine Type: Fluvax 3+    Site: left deltoid    Mfr: GlaxoSmithKline    Dose: 0.5 ml    Route: IM    Given by: Hale Drone CMA    Exp. Date: 01/22/2011    Lot #: [id number]    VIS given: 02/16/10 version given June 03, 2010.

## 2010-08-24 NOTE — Progress Notes (Signed)
  Phone Note Call from Patient   Summary of Call: PT SAYS SHE NEEDS A BED REST NOTE SINCE [facility] THEY GET HER UP A 5:30 AND CANT GO BACK UPSTAIRS TIL 8:30 ....  PT SAYS HER NOSE IS CONGESTIVE AND BLOWING OUT YELLOW MUCUS... PT SAYS SHE IS STOPPED UP AGAIN.... PT SAYS SHE IS STILL TAKING THE ALLERGY PILLS... PT SAYS THE PROVIDER GAVE HER A Z-PAC BEFORE WHEN SHE HAD THESE SYMPTOMS... [facility] Initial call taken by: [place] SHANNON  Follow-up for Phone Call        I will call in a Z pak, but please make sure pt. filled all of her allergy and asthma meds and is taking them appropriately--she was not previously. Please also write out a green note for her to pick up stating to allow her to have bedrest during the day while she is not feeling well.  You can use my signature stamp if necessary Let her know her labs were stable except for her blood sugar, which was high--please have her come in for a fasting blood glucose and A1C next week. Follow-up by: Julieanne Manson MD,  June 16, 2010 10:34 AM  Additional Follow-up for Phone Call Additional follow up Details #1::        tried calling pt but no answer .Marland KitchenMarland KitchenMarland KitchenArmenia Shannon  June 16, 2010 10:58 AM  Left message on answering machine for pt to call back.Marland KitchenMarland KitchenMarland KitchenArmenia Shannon  June 16, 2010 12:11 PM   New Problems: HYPERGLYCEMIA (ICD-790.29)   Additional Follow-up for Phone Call Additional follow up Details #2::    pt is aware and note faxed.Marland KitchenMarland KitchenArmenia Shannon  June 16, 2010 2:40 PM   New Problems: HYPERGLYCEMIA (ICD-790.29) New/Updated Medications: AZITHROMYCIN 250 MG TABS (AZITHROMYCIN) 2 tabs by mouth today, then 1 tab by mouth daily for 4 more days Prescriptions: AZITHROMYCIN 250 MG TABS (AZITHROMYCIN) 2 tabs by mouth today, then 1 tab by mouth daily for 4 more days  #1 pack x 0   Entered and Authorized by:   Julieanne Manson MD   Signed by:   Julieanne Manson MD on 06/16/2010   Method used:   Faxed to  ...       [facility] - Pharmac (retail)       [address] [place], [place]  [address]       Ph: [phone]       Fax: [phone]   RxID:   [id number]

## 2010-08-24 NOTE — Progress Notes (Signed)
Summary: Refills  Phone Note Call from Patient   Summary of Call: The pt needs more refills from norvax, clodine and her acid reflux medications.  [facility]).  Pt has not recert her orange card yet because her ID from [employer] needs to be update and she doesn't have the money but she is planning to work on that as soon as possible. Ercel Normoyle MD Initial call taken by: Manon Hilding,  January 26, 2010 4:34 PM  Follow-up for Phone Call        The pt called [place] back again because she needs refills from norvasc, acid reflux, and clodine.Manon Hilding  January 27, 2010 4:23 PM  Additional Follow-up for Phone Call Additional follow up Details #1::        Pt requesting her med refills be sent to [facility] [address] since her card is currently outdated. Additional Follow-up by: Vesta Mixer CMA,  January 27, 2010 4:38 PM    Additional Follow-up for Phone Call Additional follow up Details #2::    Would recommend that instead of getting her Aciphex filled at [facility], she just pick up Prilosec OTC 20 mg daily--suspect Aciphex will be quite expensive.  Julieanne Manson MD  January 28, 2010 3:43 PM   Additional Follow-up for Phone Call Additional follow up Details #3:: Details for Additional Follow-up Action Taken: Left message on answering machine for pt to return call at [phone]..... Tiffany McCoy CMA  January 28, 2010 4:11 PM   Fast busy sound....... Vesta Mixer CMA  February 02, 2010 4:54 PM   Still fast busy, will mail letter to have her call.......... Elmarie Shiley McCoy CMA  February 09, 2010 12:17 PM   Prescriptions: CATAPRES 0.2 MG  TABS (CLONIDINE HCL) 1 tab by mouth two times a day  #60 Each x 6   Entered and Authorized by:   Julieanne Manson MD   Signed by:   Julieanne Manson MD on 01/28/2010   Method used:   Electronically to        [facility] [phone]* (retail)       [address]       [place], [place]  [address]       Ph: [phone]       Fax: [phone]   RxID:    [id number] FUROSEMIDE 40 MG  TABS (FUROSEMIDE) 2 tabs by mouth every morning and 1/2 tablet at noon  #75 x 6   Entered and Authorized by:   Julieanne Manson MD   Signed by:   Julieanne Manson MD on 01/28/2010   Method used:   Electronically to        [facility] [phone]* (retail)       [address]       [place], [place]  [address]       Ph: [phone]       Fax: [phone]   RxID:   [id number] NORVASC 5 MG  TABS (AMLODIPINE BESYLATE) 1 tab by mouth daily  #30 x 6   Entered and Authorized by:   Julieanne Manson MD   Signed by:   Julieanne Manson MD on 01/28/2010   Method used:   Electronically to        [facility] [phone]* (retail)       [address]       [place], [place]  [address]       Ph: [phone]       Fax:  [phone]   RxID:   [id number]

## 2010-08-24 NOTE — Assessment & Plan Note (Signed)
Summary: FOLLOW UP VISIT///GK   Vital Signs:  Patient profile:   60 year old female Weight:      231.1 pounds Temp:     97.9 degrees F oral Pulse rate:   93 / minute Pulse rhythm:   regular Resp:     19 per minute BP sitting:   138 / 84  (left arm) Cuff size:   regular  Vitals Entered By: Geanie Cooley  (September 03, 2009 4:31 PM) CC: Pt here for followup visit. Pt states her back is still cracking. Pt states her right foot are really hurting, her feet and ankles are swollen. Pt states on the right leg, pt has pain on the top of her right foot and ankle. On the left leg, pt states her knee hurts and its swollen. Pt states she is out of allergy medicine. Pt states she has stinging in her right breast. Is Patient Diabetic? No Pain Assessment Patient in pain? yes     Location: foot Intensity: 10  Does patient need assistance? Functional Status Self care Ambulation Normal    Primary Care Provider:  Julieanne Manson MD  CC:  Pt here for followup visit. Pt states her back is still cracking. Pt states her right foot are really hurting, her feet and ankles are swollen. Pt states on the right leg, pt has pain on the top of her right foot and ankle. On the left leg, and pt states her knee hurts and its swollen. Pt states she is out of allergy medicine. Pt states she has stinging in her right breast..  History of Present Illness: Pt. has not been able to get a job--so she is planning to apply for disability.  When asked what she feels is disabling her, she feels her right ankle is causing her too much pain and swelling.  Also mentions her left knee pain.    1.  Right ankle swelling and pain:  Does take Celebrex, but does not seem to help anymore.  Has been on Advil, Goody powders in past--do not cause GI distress, but does not seem to help after a while.  Pt. has been to PT for her left knee in past--denies ever getting a home exercise program.  Trying to watch her oral intake since the  holidays--states she was not good with eating now.  Is walking her dog.  [facility]   2.   Bilateral LE edema:  Pt. tries to watch sodium intake and does have knee high compression stockings, but waits too late to put them on in the day and then too difficult to get on.  Started on Norvasc in early 2000s--thinks perhaps edema may have started after that  3.  Bilateral epicondylitis:  problems on and off.  Never obtained the air splint for her arms.  4.  Asthma:  has not had her flu shot yet this year.  Fairly well controlled.  Has had some URI symptoms that resolved without asthma exacerbations.  Fexofenadine not helping with allergies.  Did better with Claritin D.  5.  Right nipple stinging for 2 days at beginning of week.  Then resolved and came back last night.  Did start wearing a bra that is new to her with a seam that goes right across nipple.  Allergies (verified): 1)  ! Pcn 2)  ! Codeine 3)  ! Doxycycline  Physical Exam  General:  Morbidly obese, NAD Eyes:  No conjunctival injection  Nose:  mucosal erythema and mucosal edema.  Clear discharge Breasts:  No findings on examination of areolas, nipples Lungs:  Normal respiratory effort, chest expands symmetrically. Lungs are clear to auscultation, no crackles or wheezes. Heart:  Normal rate and regular rhythm. S1 and S2 normal without gallop, murmur, click, rub or other extra sounds. Extremities:  Pitting edema--moderate to just below knees.  Skin thickened and shiny about ankles from chronic edema.   Tenderness mainly around medial malleolus of right ankle. Knees with hypertrophic changes bilaterally   Impression & Recommendations:  Problem # 1:  PERIPHERAL EDEMA (ICD-782.3) Pt. to work on weight loss and wearing compression stockings regularly She also will work on getting her legs elevated when sitting. Does not want to switch from Norvasc at this time to see if may help reduce this Her  updated medication list for this problem includes:    Furosemide 40 Mg Tabs (Furosemide) .[name]... 2 tabs by mouth every morning and 1/2 tablet at noon  Problem # 2:  ARTHRITIS,RIGHT ANKLE/FOOT (ICD-716.97) Feel that some pain secondary to edema--treatment also as above Switch from Celebrex to Arthrotec. Orders: Physical Therapy Referral (PT)  Problem # 3:  RHINITIS, ALLERGIC NEC (ICD-477.8) Change to Allegra D and follow BP  Problem # 4:  ASTHMA (ICD-493.90) Stable Her updated medication list for this problem includes:    Ventolin Hfa 108 (90 Base) Mcg/act Aers (Albuterol sulfate) .[name]... 2 puffs every 4 hours as needed shortness of breath    Advair Diskus 100-50 Mcg/dose Misc (Fluticasone-salmeterol) .[name]... 1 inhalation two times a day  Problem # 5:  HYPERTENSION (ICD-401.9) Stable Her updated medication list for this problem includes:    Norvasc 5 Mg Tabs (Amlodipine besylate) .[name]... 1 tab by mouth daily    Furosemide 40 Mg Tabs (Furosemide) .[name]... 2 tabs by mouth every morning and 1/2 tablet at noon    Catapres 0.2 Mg Tabs (Clonidine hcl) .[name]... 1 tab by mouth two times a day  Problem # 6:  KNEE PAIN (ICD-719.46)  PT The following medications were removed from the medication list:    Celebrex 200 Mg Caps (Celecoxib) .[name]... 1 tab by mouth daily Her updated medication list for this problem includes:    Flexeril 10 Mg Tabs (Cyclobenzaprine hcl) .[name]... 1 tab by mouth every 8 hours as needed for back pain--one time fill    Arthrotec 75 75-200 Mg-mcg Tabs (Diclofenac-misoprostol) .[name]... 1 tab by mouth two times a day with food for ankle and knee, elbow pain  Orders: Physical Therapy Referral (PT)  Problem # 7:  NIPPLE IRRITATION (ICD-611.79) Probable mechanical irritation from bra seam--to stop wearing new bra--if still continues with symptoms, to call.  Complete Medication List: 1)  Norvasc 5 Mg Tabs (Amlodipine besylate) .[name].. 1 tab by mouth daily 2)  Furosemide 40 Mg Tabs (Furosemide)  .... 2 tabs by mouth every morning and 1/2 tablet at noon 3)  Catapres 0.2 Mg Tabs (Clonidine hcl) .[name].. 1 tab by mouth two times a day 4)  Allegra-d 24 Hour 180-240 Mg Xr24h-tab (Fexofenadine-pseudoephedrine) .[name].. 1 tab by mouth daily for allergies 5)  Ventolin Hfa 108 (90 Base) Mcg/act Aers (Albuterol sulfate) .... 2 puffs every 4 hours as needed shortness of breath 6)  Aciphex 20 Mg Tbec (Rabeprazole sodium) .[name].. 1 by mouth once daily 7)  Advair Diskus 100-50 Mcg/dose Misc (Fluticasone-salmeterol) .[name].. 1 inhalation two times a day 8)  Cvs Nasal Spray 0.05 % Soln (Oxymetazoline hcl) .... 2 sprays in each nostril two times  a day as needed 9)  Air Cast Arm Splint  .... Left medial epidondylitis 726.31 10)  Prenatal Vitamins 0.8 Mg Tabs (Prenatal multivit-min-fe-fa) .[name].. 1 tab by mouth daily 11)  Flexeril 10 Mg Tabs (Cyclobenzaprine hcl) .[name].. 1 tab by mouth every 8 hours as needed for back pain--one time fill 12)  Arthrotec 75 75-200 Mg-mcg Tabs (Diclofenac-misoprostol) .[name].. 1 tab by mouth two times a day with food for ankle and knee, elbow pain  Other Orders: Flu Vaccine 6-35 months (45409) Admin 1st Vaccine (81191) Admin 1st Vaccine [facility]) [phone])  Patient Instructions: 1)  CPP with Dr. Delrae Alfred in 3 months 2)  Call if you do not hear from PT in 2 weeks 3)  Dr. Chevis Pretty was Dr. Delorse Lek parnter--would look him up in phone book. Prescriptions: ARTHROTEC 75 75-200 MG-MCG TABS (DICLOFENAC-MISOPROSTOL) 1 tab by mouth two times a day with food for ankle and knee, elbow pain  #60 x 6   Entered and Authorized by:   Julieanne Manson MD   Signed by:   Julieanne Manson MD on 09/03/2009   Method used:   Faxed to ...       [facility] - [facility] (retail)       [address] [place], [place]  [address]       Ph: [phone]       Fax: [phone]   RxID:   [id number] ALLEGRA-D 24 HOUR 180-240 MG XR24H-TAB (FEXOFENADINE-PSEUDOEPHEDRINE) 1 tab by mouth  daily for allergies  #30 x 11   Entered and Authorized by:   Julieanne Manson MD   Signed by:   Julieanne Manson MD on 09/03/2009   Method used:   Faxed to ...       [facility] - [facility] (retail)       [address] [place], [place]  [address]       Ph: [phone]       Fax: [phone]   RxID:   [id number]    Vital Signs:  Patient profile:   60 year old female Weight:      231.1 pounds Temp:     97.9 degrees F oral Pulse rate:   93 / minute Pulse rhythm:   regular Resp:     19 per minute BP sitting:   138 / 84  (left arm) Cuff size:   regular  Vitals Entered By: Geanie Cooley  (September 03, 2009 4:31 PM)      Influenza Vaccine    Vaccine Type: Fluvax 6-35mos    Site: left deltoid    Mfr: Sanofi Pasteur    Dose: 0.5 ml    Route: IM    Given by: Geanie Cooley    Exp. Date: 01/21/2010    Lot #: [id number]    VIS given: 02/15/07 version given September 28, 2009.  Flu Vaccine Consent Questions    Do you have a history of severe allergic reactions to this vaccine? no    Any prior history of allergic reactions to egg and/or gelatin? no    Do you have a sensitivity to the preservative Thimersol? no    Do you have a past history of Guillan-Barre Syndrome? no    Do you currently have an acute febrile illness? no    Have you ever had a severe reaction to latex? no    Vaccine information given and explained to patient? yes    Are  you currently pregnant? no

## 2010-08-24 NOTE — Letter (Signed)
Summary: *HSN Results Follow up  [facility]  [address] [place], [place] [address]   Phone: [phone]  Fax: [phone]      02/09/2010   Marikay Alar POB [address] [place], [place]  [address]   Dear  Ms. Ozie Ebeling,                            ____S.Drinkard,FNP   ____D. Gore,FNP       ____B. McPherson,MD   ____V. Rankins,MD    __X__E. Mulberry,MD    ____N. Daphine Deutscher, FNP  ____D. Reche Dixon, MD    ____K. Philipp Deputy, MD    ____Other     This letter is to inform you that your recent test(s):  _______Pap Smear    _______Lab Test     _______X-ray    _______ is within acceptable limits  _______ requires a medication change  _______ requires a follow-up lab visit  _______ requires a follow-up visit with your provider   Comments:  We have tried to contact you regarding your medication, please give our office a call when you receive this letter.  Thank you.       _________________________________________________________ If you have any questions, please contact our office                     Sincerely,  Vesta Mixer CMA [facility]

## 2010-08-24 NOTE — Assessment & Plan Note (Signed)
Summary: sinusitis; asthma; conjunctivitis   Vital Signs:  Patient profile:   60 year old female Height:      59 inches Weight:      229 pounds BMI:     46.42 Temp:     98.2 degrees F oral Pulse rate:   98 / minute Pulse rhythm:   regular Resp:     22 per minute BP sitting:   160 / 90  (left arm) Cuff size:   regular  Vitals Entered By: Armenia Shannon (April 28, 2010 11:55 AM) CC: pt says she has a sinus infection.... pt says her throat hurts and she has ear pain.....[name] Is Patient Diabetic? No Pain Assessment Patient in pain? no       Does patient need assistance? Functional Status Self care Ambulation Normal   Primary Care Provider:  Julieanne Manson MD  CC:  pt says she has a sinus infection.... pt says her throat hurts and she has ear pain.......  History of Present Illness: "Every 6 mos I get rhinosinusitis." Feeling sick x 1 week.  Notes facial pressure, bilat otalgia, scratchy throat, chest and nasal congestion.  ? fever - not checked at home.  No chills.  Uses Advair for asthma.  Out of proventil.  BUt, has not felt like she needed it.  No nausea, vomiting.  No real diarrhea.  Out of all meds for several weeks.  Orange card expired.  Needs refills on bp meds, etc. Denies eye irritation.  Notes some clear discharge from right eye.  Not matted shut.  No change in vision.  Has noted redness today in right eye.   Current Medications (verified): 1)  Norvasc 5 Mg  Tabs (Amlodipine Besylate) .[name].. 1 Tab By Mouth Daily 2)  Furosemide 40 Mg  Tabs (Furosemide) .... 2 Tabs By Mouth Every Morning and 1/2 Tablet At Noon 3)  Catapres 0.2 Mg  Tabs (Clonidine Hcl) .[name].. 1 Tab By Mouth Two Times A Day 4)  Allegra-D 24 Hour 180-240 Mg Xr24h-Tab (Fexofenadine-Pseudoephedrine) .[name].. 1 Tab By Mouth Daily For Allergies 5)  Ventolin Hfa 108 (90 Base) Mcg/act  Aers (Albuterol Sulfate) .... 2 Puffs Every 4 Hours As Needed Shortness of Breath 6)  Aciphex 20 Mg Tbec (Rabeprazole Sodium) .[name]..  1 By Mouth Once Daily 7)  Advair Diskus 100-50 Mcg/dose Misc (Fluticasone-Salmeterol) .[name].. 1 Inhalation Two Times A Day 8)  Cvs Nasal Spray 0.05 % Soln (Oxymetazoline Hcl) .... 2 Sprays in Each Nostril Two Times A Day As Needed 9)  Air Cast Arm Splint .... Left Medial Epidondylitis 726.31 10)  Prenatal Vitamins 0.8 Mg Tabs (Prenatal Multivit-Min-Fe-Fa) .[name].. 1 Tab By Mouth Daily 11)  Flexeril 10 Mg Tabs (Cyclobenzaprine Hcl) .[name].. 1 Tab By Mouth Every 8 Hours As Needed For Back Pain--One Time Fill 12)  Diclofenac Sodium 75 Mg Tbec (Diclofenac Sodium) .[name].. 1 Tab By Mouth Two Times A Day As Needed Pain  Allergies (verified): 1)  ! Pcn 2)  ! Codeine 3)  ! Doxycycline  Physical Exam  General:  alert, well-developed, and well-nourished.   Head:  normocephalic and atraumatic.   Eyes:  right eye conjunctival injection PERRLA  Ears:  L TM somewhat injected R TM normal  Nose:  mucosal erythema and mucosal edema.   Mouth:  pharynx pink and moist and no exudates.   Neck:  supple and no cervical lymphadenopathy.   Lungs:  exp rhonchi/wheezing bases bilat no rales  Heart:  normal rate and regular rhythm.   Msk:  long, thick nails patient notes hard to trim  Neurologic:  alert & oriented X3 and cranial nerves II-XII intact.   Psych:  normally interactive.     Impression & Recommendations:  Problem # 1:  SINUSITIS, ACUTE (ICD-461.9) has assoc conjunctivitis tx with zpak, nasal steroid, refill allegra d tobramycin for eye  Her updated medication list for this problem includes:    Allegra-d 24 Hour 180-240 Mg Xr24h-tab (Fexofenadine-pseudoephedrine) .[name]... 1 tab by mouth daily for allergies    Cvs Nasal Spray 0.05 % Soln (Oxymetazoline hcl) .[name]... 2 sprays in each nostril two times a day as needed    Nasacort Aq 55 Mcg/act Aers (Triamcinolone acetonide) .[name]... 2 sprays each nostril once daily    Zithromax Z-pak 250 Mg Tabs (Azithromycin) .[name]... Take as directed  Problem # 2:  ASTHMA  (ICD-493.90) increased wheezing may be from sinusititis may be from being out of PPI sample of advair 250/50 given for 2 weeks refill on ventolin sent to [facility]  Her updated medication list for this problem includes:    Ventolin Hfa 108 (90 Base) Mcg/act Aers (Albuterol sulfate) .[name]... 2 puffs every 4 hours as needed shortness of breath    Advair Diskus 100-50 Mcg/dose Misc (Fluticasone-salmeterol) .[name]... 1 inhalation two times a day  Problem # 3:  HYPERTENSION (ICD-401.9) refill meds  Her updated medication list for this problem includes:    Norvasc 5 Mg Tabs (Amlodipine besylate) .[name]... 1 tab by mouth daily    Furosemide 40 Mg Tabs (Furosemide) .[name]... 2 tabs by mouth every morning and 1/2 tablet at noon    Catapres 0.2 Mg Tabs (Clonidine hcl) .[name]... 1 tab by mouth two times a day  Problem # 4:  GASTROESOPHAGEAL REFLUX DISEASE (ICD-530.81) refill meds  Her updated medication list for this problem includes:    Aciphex 20 Mg Tbec (Rabeprazole sodium) .[name]... 1 by mouth once daily  Complete Medication List: 1)  Norvasc 5 Mg Tabs (Amlodipine besylate) .[name].. 1 tab by mouth daily 2)  Furosemide 40 Mg Tabs (Furosemide) .... 2 tabs by mouth every morning and 1/2 tablet at noon 3)  Catapres 0.2 Mg Tabs (Clonidine hcl) .[name].. 1 tab by mouth two times a day 4)  Allegra-d 24 Hour 180-240 Mg Xr24h-tab (Fexofenadine-pseudoephedrine) .[name].. 1 tab by mouth daily for allergies 5)  Ventolin Hfa 108 (90 Base) Mcg/act Aers (Albuterol sulfate) .... 2 puffs every 4 hours as needed shortness of breath 6)  Aciphex 20 Mg Tbec (Rabeprazole sodium) .[name].. 1 by mouth once daily 7)  Advair Diskus 100-50 Mcg/dose Misc (Fluticasone-salmeterol) .[name].. 1 inhalation two times a day 8)  Cvs Nasal Spray 0.05 % Soln (Oxymetazoline hcl) .... 2 sprays in each nostril two times a day as needed 9)  Air Cast Arm Splint  .... Left medial epidondylitis 726.31 10)  Prenatal Vitamins 0.8 Mg Tabs (Prenatal multivit-min-fe-fa) .[name].. 1 tab by mouth  daily 11)  Flexeril 10 Mg Tabs (Cyclobenzaprine hcl) .[name].. 1 tab by mouth every 8 hours as needed for back pain--one time fill 12)  Diclofenac Sodium 75 Mg Tbec (Diclofenac sodium) .[name].. 1 tab by mouth two times a day as needed pain 13)  Nasacort Aq 55 Mcg/act Aers (Triamcinolone acetonide) .... 2 sprays each nostril once daily 14)  Zithromax Z-pak 250 Mg Tabs (Azithromycin) .... Take as directed 15)  Tobrex 0.3 % Soln (Tobramycin sulfate) .[name].. 1-2 drops every 4 hours until improvement/resolved  Patient Instructions: 1)  Take the Advair 250/50 two times a day for 2 weeks.  THen, go back to your usual dose. 2)  Get the antibiotics filled at [facility] today. 3)  Your blood pressure, allergy and acid reflux medicines were faxed to [facility]. 4)  Schedule an appt at the foot clinic for nail trimming. 5)  Schedule a follow up with Dr. Delrae Alfred in 2 weeks to follow up on BP and asthma. Prescriptions: TOBREX 0.3 % SOLN (TOBRAMYCIN SULFATE) 1-2 drops every 4 hours until improvement/resolved  #1 bottle x 0   Entered and Authorized by:   Tereso Newcomer PA-C   Signed by:   Tereso Newcomer PA-C on 04/28/2010   Method used:   Print then Give to Patient   RxID:   [id number] ZITHROMAX Z-PAK 250 MG TABS (AZITHROMYCIN) take as directed  #1 pack x 0   Entered and Authorized by:   Tereso Newcomer PA-C   Signed by:   Tereso Newcomer PA-C on 04/28/2010   Method used:   Print then Give to Patient   RxID:   [id number] NASACORT AQ 55 MCG/ACT AERS (TRIAMCINOLONE ACETONIDE) 2 sprays each nostril once daily  #1 x 1   Entered and Authorized by:   Tereso Newcomer PA-C   Signed by:   Tereso Newcomer PA-C on 04/28/2010   Method used:   Faxed to ...       [facility] - Pharmac (retail)       [address] [place], [place]  [address]       Ph: [phone]       Fax: [phone]   RxID:   [id number] ALLEGRA-D 24 HOUR 180-240 MG XR24H-TAB (FEXOFENADINE-PSEUDOEPHEDRINE) 1 tab by mouth  daily for allergies  #30 x 1   Entered and Authorized by:   Tereso Newcomer PA-C   Signed by:   Tereso Newcomer PA-C on 04/28/2010   Method used:   Faxed to ...       [facility] - Pharmac (retail)       [address] [place], [place]  [address]       Ph: [phone]       Fax: [phone]   RxID:   [id number] ACIPHEX 20 MG TBEC (RABEPRAZOLE SODIUM) 1 by mouth once daily  #30 x 6   Entered and Authorized by:   Tereso Newcomer PA-C   Signed by:   Tereso Newcomer PA-C on 04/28/2010   Method used:   Faxed to ...       [facility] - Pharmac (retail)       [address] [place], [place]  [address]       Ph: [phone]       Fax: [phone]   RxID:   [id number] VENTOLIN HFA 108 (90 BASE) MCG/ACT  AERS (ALBUTEROL SULFATE) 2 puffs every 4 hours as needed shortness of breath  #1 x 5   Entered and Authorized by:   Tereso Newcomer PA-C   Signed by:   Tereso Newcomer PA-C on 04/28/2010   Method used:   Faxed to ...       [facility] - Pharmac (retail)       [address] [place], [place]  [address]       Ph: [phone]       Fax: [phone]   RxID:   [id number] CATAPRES 0.2 MG  TABS (CLONIDINE HCL) 1  tab by mouth two times a day  #60 Each x 6   Entered and Authorized by:   Tereso Newcomer PA-C   Signed by:   Tereso Newcomer PA-C on 04/28/2010   Method used:   Faxed to ...       [facility] - Pharmac (retail)       [address] [place], [place]  [address]       Ph: [phone]       Fax: [phone]   RxID:   [id number] FUROSEMIDE 40 MG  TABS (FUROSEMIDE) 2 tabs by mouth every morning and 1/2 tablet at noon  #75 x 6   Entered and Authorized by:   Tereso Newcomer PA-C   Signed by:   Tereso Newcomer PA-C on 04/28/2010   Method used:   Faxed to ...       [facility] - Pharmac (retail)       [address] [place], [place]  [address]       Ph: [phone]       Fax: [phone]   RxID:   [id number] NORVASC 5 MG  TABS (AMLODIPINE BESYLATE) 1 tab by mouth daily  #30 x 6   Entered and Authorized by:   Tereso Newcomer PA-C   Signed by:   Tereso Newcomer PA-C on 04/28/2010   Method used:   Faxed to ...       [facility] - Pharmac (retail)       [address] [place], [place]  [address]       Ph: [phone]       Fax: [phone]   RxID:   [id number]

## 2010-08-24 NOTE — Progress Notes (Signed)
Summary: NEEDS REFILL ON CELEBREX  Phone Note Call from Patient Call back at Home Phone [phone]   Reason for Call: Refill Medication Summary of Call: MULBERRY PT. MS Behringer CALLED AND SAYS THAT SHE IS HURTING ALL OVER, STAYING AT THE [facility]ER HOUSE AND CAN'T LONG, AND SHE IS TRYING TO FIND SOME WHERE TO LIVE. SHE WANTS DR MULBERRY TO CONTINUE HER BACK ON THE CLELBREX UNTIL SHE COMES BACK TO SEE HER ON 11/10, AND THE 3 MEDS DONT WORK AT ALL, SHE SAYS THER SIDE EFFECTS WERE WORSE THAN THE SYMPTOMS. SHE SAYS SHE MIGHT NEED BED REST BECASUE SHE IS HURTING BAD, EXHAUSTED AND CAN'T HARDLY BEND HER LEGS. SHE SAYS WHEN YOU CALL PRESS 2 OR THE EXTENSION IS 347.  SHE USES [facility]. Initial call taken by: Leodis Rains,  May 20, 2010 3:02 PM  Follow-up for Phone Call        Need to know which medications she is speaking about.  I'm assuming she means the ibuprofen, Diclofenac, and the combination of diclofenac and misoprostol, but she never replied to our letter or phone calls.   She was switched from Celebrex to Arthrotec back in 2010 as the Celebrex was not controlling her pain, but can try again. Let her know to not use any of the above meds and Celebrex sent in. Follow-up by: Julieanne Manson MD,  May 21, 2010 8:30 AM  Additional Follow-up for Phone Call Additional follow up Details #1::        spoke with pt and she says she doesn't have any of the meds so she will be only taking the celebrex... Armenia Shannon  May 21, 2010 3:46 PM     Additional Follow-up for Phone Call Additional follow up Details #2::    pt says   New/Updated Medications: CELEBREX 200 MG CAPS (CELECOXIB) 1 cap by mouth daily as needed pain Prescriptions: CELEBREX 200 MG CAPS (CELECOXIB) 1 cap by mouth daily as needed pain  #30 x 2   Entered and Authorized by:   Julieanne Manson MD   Signed by:   Julieanne Manson MD on 05/21/2010   Method used:   Faxed to ...       [facility] - Pharmac (retail)       [address] [place], [place]  [address]       Ph: [phone]       Fax: [phone]   RxID:   [id number]

## 2010-08-24 NOTE — Progress Notes (Signed)
Summary: SAMPLE OF ADVAIR DISC  Phone Note Call from Patient Call back at Home Phone [phone]   Summary of Call: MULBERRY PT. MS Haworth CALLED AND SAYS THAT SHE IS TRYING TO GET HER ORANGE CARD AND IN THE MEAN TIME SHE WANTS TO KNOW IF WE HAVE ANY SAMPLES OF THE ADVAIR DISC. SHE IS HAVING A HARD TIME TO GET HER ELIG. Initial call taken by: Leodis Rains,  March 26, 2010 8:57 AM  Follow-up for Phone Call        ok per v.o. Dr. Delrae Alfred pt given #2 Advair 100/50 Follow-up by: Michelle Nasuti,  March 26, 2010 3:03 PM

## 2010-08-24 NOTE — Progress Notes (Signed)
  Phone Note Call from Patient Call back at [facility] Phone [phone]   Summary of Call: Received "return to sender" x 2 on letter sent to pt's address on file and a 2nd attempt to send to [address] [place] [address], ATC pt to verify mailing address, no answer and unable to leave message. Zackery Barefoot CMA  March 09, 2010 9:30 AM   Follow-up for Phone Call        Line busy. Zackery Barefoot CMA  March 12, 2010 11:20 AM   Line busy. Zackery Barefoot CMA  March 16, 2010 9:31 AM   [employer]. Unable to reach pt to get correct mailing address. Will send "Return to Sender" letter to be scanned into pt's chart. Signing off on this PN. Zackery Barefoot CMA  March 23, 2010 9:36 AM

## 2010-08-24 NOTE — Letter (Signed)
Summary: [place] [facility]  [facility]   Imported ByArta Bruce 06/23/2010 16:24:32  _____________________________________________________________________  External Attachment:    Type:   Image     Comment:   External Document

## 2010-08-24 NOTE — Letter (Signed)
Summary: *HSN Results Follow up  [facility]  [address] [place], [place] [address]   Phone: [phone]  Fax: [phone]      05/19/2010   GLORIANNA GOTT [address] [place], [place]  [address]   Dear  Ms. Azalie Morado,                            ____S.Drinkard,FNP   ____D. Gore,FNP       ____B. McPherson,MD   ____V. Rankins,MD    ____E. Mulberry,MD    ____N. Daphine Deutscher, FNP  ____D. Reche Dixon, MD    ____K. Philipp Deputy, MD    ____Other     This letter is to inform you that your recent test(s):  _______Pap Smear    _______Lab Test     _______X-ray    _______ is within acceptable limits  _______ requires a medication change  _______ requires a follow-up lab visit  _______ requires a follow-up visit with your provider   Comments:  We have been trying to reach you.  Please give the office a call at your earliest conveinence.       _________________________________________________________ If you have any questions, please contact our office                     Sincerely,  Armenia Shannon [facility]

## 2010-08-24 NOTE — Progress Notes (Signed)
Summary: Eye referral  Phone Note Outgoing Call   Summary of Call: Mariah [name]--another eye referral for background retinopathy on Retasure Initial call taken by: Julieanne Manson MD,  June 20, 2010 4:49 PM  Follow-up for Phone Call        SEND REFERRAL TO [facility]  11-28-11WAITING FOR AN APPT .Marland KitchenCheryll [name]  June 23, 2010 12:12 PM

## 2010-08-24 NOTE — Letter (Signed)
Summary: EXCUSE//BED REST//FAXED TO [facility]  EXCUSE//BED REST//FAXED TO [facility]   Imported By: Arta Bruce 06/16/2010 16:11:58  _____________________________________________________________________  External Attachment:    Type:   Image     Comment:   External Document

## 2010-08-26 NOTE — Letter (Signed)
Summary: PODIATRY  PODIATRY   Imported By: Arta Bruce 07/06/2010 14:31:26  _____________________________________________________________________  External Attachment:    Type:   Image     Comment:   External Document

## 2010-08-26 NOTE — Letter (Signed)
Summary: MAILED REQUESTED RECORDS TO [facility]  MAILED REQUESTED RECORDS TO [facility]   Imported By: Arta Bruce 08/09/2010 16:47:11  _____________________________________________________________________  External Attachment:    Type:   Image     Comment:   External Document

## 2010-08-31 ENCOUNTER — Telehealth (INDEPENDENT_AMBULATORY_CARE_PROVIDER_SITE_OTHER): Payer: Self-pay | Admitting: Internal Medicine

## 2010-09-13 ENCOUNTER — Encounter: Payer: Self-pay | Admitting: Pulmonary Disease

## 2010-09-13 ENCOUNTER — Ambulatory Visit (INDEPENDENT_AMBULATORY_CARE_PROVIDER_SITE_OTHER): Payer: Self-pay | Admitting: Pulmonary Disease

## 2010-09-13 DIAGNOSIS — J45909 Unspecified asthma, uncomplicated: Secondary | ICD-10-CM

## 2010-09-13 DIAGNOSIS — J209 Acute bronchitis, unspecified: Secondary | ICD-10-CM

## 2010-09-15 NOTE — Progress Notes (Signed)
Summary: SINUS INFECT OR ALLERGIES  Phone Note Call from Patient Call back at [phone]   Reason for Call: Talk to Nurse Summary of Call: Ewald Beg PT. MS Gauss CALLED TO SAY THAT SHE HAS A DRY COUGH, EARS HURT,SNEEZING, & BOTH CHEEKBONES HURT,  SHE THINKS SHE HAS A SINUS INFECTION OR ALLERGIES, AND SHE WANTS TO KNOW IF WE CAN CALL SOMETHING INTO [place] [facility].  MS Eves SAYS THAT WE CAN ALSO LEAVE A MESSAGE WITH MS [name] CORBETT WHO IS A RETIRED FRIEND, THE # IS [phone]. Initial call taken by: Leodis Rains,  August 31, 2010 4:24 PM  Follow-up for Phone Call        Left message on preferred number for pt. to return call.  Also left message with Ms. Corbett for same.  Dutch Quint RN  September 01, 2010 3:32 PM  Symptoms have been going on about two and a half weeks.  Has dry cough primarily at night, has had an earache off and on, has yellow or green mucus from nose, has headache between eyes, pain goes across face.  Denies SOB, sore throat.  Using nasocort and Xyzal but not effective.  Not using anything OTC due to finances.  Moist heat relieves discomfort some, not for long.   Thought she might have had a fever over the weekend, not sure, had some chills.  Has had some puffiness to eyes.   Advised as per sinus protocol -- unable to be seen in office, has no money, no transportation -- is living in shelter.  Would appreciate medication called in.  Will call back later this afternoon, difficult to get calls in shelter.  Follow-up by: Dutch Quint RN,  September 02, 2010 12:00 PM  Additional Follow-up for Phone Call Additional follow up Details #1::        Z pack called in. Notify pt.--tried to call on Saturday, but could only leave a message to call back  09/04/10 Additional Follow-up by: Julieanne Manson MD,  September 02, 2010 6:45 PM    Additional Follow-up for Phone Call Additional follow up Details #2::    Left message on shelter answering machine for pt. to return call.   Also called friend -- Left message on answering machine for pt. to return call.  Dutch Quint RN  September 03, 2010 12:55 PM  Tried shelter again today--could only leave message.  Please let her know if she calls back that her sugar control is okay.  Julieanne Manson MD  September 04, 2010 4:10 PM   Left message on Ms. Corbett's voicemail for pt. to return call.  Dutch Quint RN  September 06, 2010 2:37 PM  Pt. received z-pak, is on third day.  States she also has an earache as well, will complete ABT.  Is unable to get pass to stay in all day at the shelter she is staying at now, is out all day, legs hurt.  Is job and house hunting.  Has applied for disability.  Will call back if anything is needed.  Follow-up by: Dutch Quint RN,  September 06, 2010 6:17 PM  Prescriptions: AZITHROMYCIN 250 MG TABS (AZITHROMYCIN) 2 tabs by mouth today, then 1 tab by mouth daily for 4 more days  #6 x 0   Entered and Authorized by:   Julieanne Manson MD   Signed by:   Julieanne Manson MD on 09/02/2010   Method used:   Faxed to ...       [facility] (retail)       [address] [place], [place]  [address]       Ph: [phone]       Fax: [phone]   RxID:   [id number]

## 2010-09-21 NOTE — Assessment & Plan Note (Signed)
Summary: OV BREATHING TREATMENT//SH   Visit Type:  Acute visit Primary Provider/Referring Provider:  Julieanne Manson MD  CC:  Pt c/o productive cough with clear mucus, DOE, wheezing, headaches, sinuse pressure, intermittent sharp pain in right ear, and increased stress. Has to wake up at 5:30am because she had to move into a shelter. Finished zpak for sinus infection prescribed by Dr. Delrae Alfred.  History of Present Illness: 60 year old AAF patient with persistent Asthma well controlled on advair - when compliant.   02/19/08-- Presents for for 2 week of cough, congestion- yellow thick mucus and wheezing.  Ran out of advair. Given avelox & pred taper  7/30 ER visit for exacerbation - improved with nebs  - admits to not using advair 9/ 25,/09 --complains of 2 weeks of cough, congesiton, wheezing. Says she has not ran out  Advair but does not have insurance and depends on samples or help from [facility] --rx doxycycline  July 03, 2008--Complains of nasal congestion, sinus pressure, cough thick mucus. teeth pain. for 2 weeks. Seen by dentist yesterday, dental films showed ?sinus infection.  September 13, 2010 11:26 AM  - Last Ov aug'10 Living in a shelter now, cut her lasix in half, c/o clear phlegm now c/o productive cough with clear mucus, DOE, wheezing, headaches, sinuse pressure, intermittent sharp pain in right ear, and increased stress. Has to wake up at 5:30am because she had to move into a shelter. Finished zpak for sinus infection prescribed by Dr. Delrae Alfred.  Denies chest pain, dyspnea, orthopnea, hemoptysis, fever, n/v/d, edema, headache, weight loss.      Current Medications (verified): 1)  Norvasc 5 Mg  Tabs (Amlodipine Besylate) .[name].. 1 Tab By Mouth Daily 2)  Furosemide 40 Mg  Tabs (Furosemide) .... Take 1 Tab By Mouth At Bedtime 3)  Catapres 0.2 Mg  Tabs (Clonidine Hcl) .[name].. 1 Tab By Mouth Two Times A Day 4)  Xyzal 5 Mg Tabs (Levocetirizine Dihydrochloride) .[name].. 1 Tab By  Mouth Daily 5)  Ventolin Hfa 108 (90 Base) Mcg/act  Aers (Albuterol Sulfate) .... 2 Puffs Every 4 Hours As Needed Shortness of Breath 6)  Aciphex 20 Mg Tbec (Rabeprazole Sodium) .[name].. 1 By Mouth Once Daily 7)  Advair Diskus 100-50 Mcg/dose Misc (Fluticasone-Salmeterol) .[name].. 1 Inhalation Two Times A Day 8)  Air Cast Arm Splint .... Left Medial Epidondylitis 726.31 9)  Nasacort Aq 55 Mcg/act Aers (Triamcinolone Acetonide) .... 2 Sprays Each Nostril Once Daily 10)  Celebrex 200 Mg Caps (Celecoxib) .[name].. 1 Cap By Mouth Daily As Needed Pain  Allergies (verified): 1)  ! Pcn 2)  ! Codeine 3)  ! Doxycycline  Past History:  Past Medical History: Last updated: 11/20/2007 THUMB PAIN, RIGHT (ICD-729.5) ARTHRITIS,RIGHT ANKLE/FOOT (ICD-716.97) RHINITIS, ALLERGIC NEC (ICD-477.8) ASTHMA (ICD-493.90) HYPERTENSION (ICD-401.9) GASTROESOPHAGEAL REFLUX DISEASE (ICD-530.81) TENDINITIS (ICD-726.90) KNEE PAIN (EAV-409.81)    Social History: Last updated: 10/02/2008 Substitute teaching. 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 and a Engineer, water Single, but dating.  Review of Systems       The patient complains of dyspnea on exertion.  The patient denies anorexia, fever, weight loss, weight gain, vision loss, decreased hearing, hoarseness, chest pain, syncope, peripheral edema, prolonged cough, headaches, hemoptysis, abdominal pain, melena, hematochezia, severe indigestion/heartburn, hematuria, muscle weakness, suspicious skin lesions, transient blindness, difficulty walking, depression, unusual weight change, abnormal bleeding, enlarged lymph nodes, and angioedema.    Vital Signs:  Patient profile:   60 year old female Height:  59 inches Weight:      224 pounds BMI:     45.41 O2 Sat:      98 % on Room air Temp:     97.6 degrees F oral Pulse rate:   79 / minute BP sitting:   142 / 90  (left arm) Cuff size:   large  Vitals Entered By: Zackery Barefoot CMA (September 13, 2010 11:08 AM)  O2 Flow:  Room air CC: Pt c/o productive cough with clear mucus, DOE, wheezing, headaches, sinuse pressure, intermittent sharp pain in right ear, increased stress. Has to wake up at 5:30am because she had to move into a shelter. Finished zpak for sinus infection prescribed by Dr. Delrae Alfred Comments Medications reviewed with patient Verified contact number and pharmacy with patient Zackery Barefoot CMA  September 13, 2010 11:10 AM    Physical Exam  Additional Exam:  GEN: A/Ox3; pleasant , NAD HEENT:  /AT, , EACs-clear, TMs-wnl, NOSE-mild redness, clear discharge  THROAT-clear NECK:  Supple w/ fair ROM; no JVD; normal carotid impulses w/o bruits; no thyromegaly or nodules palpated; no lymphadenopathy. RESP  Coarse BS w/ few rhonchi  CARD:  RRR, no m/r/g   GI:   Soft & nt; nml bowel sounds; no organomegaly or masses detected. Musco: Warm bil,  no calf tenderness edema, clubbing, pulses intact    Impression & Recommendations:  Problem # 1:  ACUTE BRONCHITIS (ICD-466.0)  mucinex If persistent phlegm or changes color, take a course of doxy x 10 ds The following medications were removed from the medication list:    Azithromycin 250 Mg Tabs (Azithromycin) .[name]... 2 tabs by mouth today, then 1 tab by mouth daily for 4 more days Her updated medication list for this problem includes:    Ventolin Hfa 108 (90 Base) Mcg/act Aers (Albuterol sulfate) .[name]... 2 puffs every 4 hours as needed shortness of breath    Advair Diskus 100-50 Mcg/dose Misc (Fluticasone-salmeterol) .[name]... 1 inhalation two times a day    Mucinex 600 Mg Xr12h-tab (Guaifenesin) .[name]..[name] Two times a day    Doxycycline Hyclate 100 Mg Caps (Doxycycline hyclate) ..... Once daily  Orders: Est. Patient Level III ([address]) Prescription Created Electronically [phone])  Problem # 2:  ASTHMA (ICD-493.90) stay on advair 100 two times a day  G[facility] meds via [facility]  Medications Added to Medication  List This Visit: 1)  Furosemide 40 Mg Tabs (Furosemide) .... Take 1 tab by mouth at bedtime 2)  Mucinex 600 Mg Xr12h-tab (Guaifenesin) .... Two times a day 3)  Doxycycline Hyclate 100 Mg Caps (Doxycycline hyclate) .... Once daily  Patient Instructions: 1)  Copy sent to: Dr Delrae Alfred 2)  Please schedule a follow-up appointment in 4 months with TP 3)  Take mucinex two times a day  4)  Take antibiotic if yellow/ green phlegm Prescriptions: DOXYCYCLINE HYCLATE 100 MG CAPS (DOXYCYCLINE HYCLATE) once daily  #10 x 0   Entered and Authorized by:   Comer Locket Vassie Loll MD   Signed by:   Comer Locket Vassie Loll MD on 09/13/2010   Method used:   Faxed to ...       [facility] (retail)       [address] [place], [place]  [address]       Ph: [phone]       Fax: [phone]   RxID:   [id number] MUCINEX 600 MG XR12H-TAB (GUAIFENESIN) two times a day  #28 x 1  Entered and Authorized by:   Comer Locket Vassie Loll MD   Signed by:   Comer Locket Vassie Loll MD on 09/13/2010   Method used:   Faxed to ...       [facility] - [facility] (retail)       [address] [place], [place]  [address]       Ph: [phone]       Fax: [phone]   RxID:   [id number]

## 2010-09-30 ENCOUNTER — Telehealth (INDEPENDENT_AMBULATORY_CARE_PROVIDER_SITE_OTHER): Payer: Self-pay | Admitting: Internal Medicine

## 2010-10-01 ENCOUNTER — Encounter: Payer: Self-pay | Admitting: Internal Medicine

## 2010-10-01 ENCOUNTER — Encounter (INDEPENDENT_AMBULATORY_CARE_PROVIDER_SITE_OTHER): Payer: Self-pay | Admitting: Internal Medicine

## 2010-10-01 DIAGNOSIS — L02419 Cutaneous abscess of limb, unspecified: Secondary | ICD-10-CM | POA: Insufficient documentation

## 2010-10-01 DIAGNOSIS — L03119 Cellulitis of unspecified part of limb: Secondary | ICD-10-CM | POA: Insufficient documentation

## 2010-10-05 NOTE — Progress Notes (Signed)
Summary: need urgent appt-poss insect bite  Phone Note Call from Patient Call back at [phone] [facility]   Summary of Call: pt needs urgent appt w/Charlette Hennings, poss bite on leg, skin prob Initial call taken by: Ernestine Mcmurray,  September 30, 2010 10:55 AM  Follow-up for Phone Call        Left message on answering machine for pt. to return call.  Dutch Quint RN  October 01, 2010 9:26 AM  Returned your call ---please contact her at this number [phone].  Nicholaus Bloom,  October 01, 2010 9:54 AM  Started last week, thought it was a mosquito bite on inside of left leg, medial knee, small patch which got bigger -- was red, wasn't hurting or itching, some swelling noted.  After about 4 days, has turned dark red, is itching and is painful when touched, still remains localized.  Has been using anti-itch cream, which relieves itch temporarily.  Did not put ice on area. Denies any SOB related to incident.  Slight swelling remains, has diminished a lot, seems more swollen at night.  Advised to use Benadryl by mouth, cautioned that it might make her sleepy.  Also advised that she can use Benadryl cream or hydrocortisone cream over-the-counter for the localized itching.  Would like to see provider -- advised that we currently have no open appts. -- she will call back for cancellations.  Advised that she might not need to be seen, we will call her back.  Also still having pain from back of left hand, fingers tingle off and on, has shooting pain to elbow a lot.  Still persists from last visit to provider. Follow-up by: Dutch Quint RN,  October 01, 2010 10:19 AM  Additional Follow-up for Phone Call Additional follow up Details #1::        Per Dr. Delrae Alfred -- acute appt. at 2 pm.  Pt. notified that only insect bite will be addressed -- verbalized understanding and agreement. Additional Follow-up by: Dutch Quint RN,  October 01, 2010 10:27 AM    Additional Follow-up for Phone Call Additional follow up Details #2::   Seen today Follow-up by: Julieanne Manson MD,  October 01, 2010 3:13 PM

## 2010-10-05 NOTE — Assessment & Plan Note (Signed)
Summary: OV   Vital Signs:  Patient profile:   60 year old female Menstrual status:  regular LMP:     09/02/2010 Weight:      223.3 pounds BSA:     1.93 Temp:     98.4 degrees F oral Pulse rate:   76 / minute Pulse rhythm:   regular Resp:     20 per minute BP sitting:   164 / 85  (left arm) Cuff size:   large  Vitals Entered By: Gaylyn Cheers RN CC: Tender spot itching on lower lip, lt leg discolored with swellling. Using Vaseline and calamine without relief, bil elbow pain worse on lt Pain Assessment Patient in pain? yes     Location: elbow Intensity: 9 Type: tingling Onset of pain  Intermittent  Does patient need assistance? Functional Status Self care Ambulation Normal LMP (date): 09/02/2010     Menstrual Status regular Enter LMP: 09/02/2010   Primary Care Provider:  Julieanne Manson MD  CC:  Tender spot itching on lower lip, lt leg discolored with swellling. Using Vaseline and calamine without relief, and bil elbow pain worse on lt.  History of Present Illness: 1.  Left leg with red patch.  Itches and hurts--started last week.  No injury to area.  No fever.  Has had swelling in the area.  No groin soreness.   Allergic to penicillin--severe reaction in past.  Currently on Doxycycline for respiratory infection--throught Pulmonary.  Still with cough.    Allergies: 1)  ! Pcn 2)  ! Codeine  Physical Exam  Extremities:  Bilateral pitting edema of legs. About 4 cm area of erythema with small 2 mm vesicle in center.  No increased warmth.  Edges fairly well demarcated.  Skin is creased in erythematous area.   Impression & Recommendations:  Problem # 1:  CELLULITIS AND ABSCESS OF LEG EXCEPT FOOT (ICD-682.6) Left leg Abx choice  a bit limited as not responding to Doxy and significant reaction to PCN--avoiding cephalosporins as well, then Try sulfa, though will not cover strep well. To get legs elevated Her updated medication list for this problem includes:   Bactrim Ds 800-160 Mg Tabs (Sulfamethoxazole-trimethoprim) .[name]... 1 tab by mouth two times a day for 10 days.  Complete Medication List: 1)  Norvasc 5 Mg Tabs (Amlodipine besylate) .[name].. 1 tab by mouth daily 2)  Furosemide 40 Mg Tabs (Furosemide) .... Take 1 tab by mouth at bedtime 3)  Catapres 0.2 Mg Tabs (Clonidine hcl) .[name].. 1 tab by mouth two times a day 4)  Xyzal 5 Mg Tabs (Levocetirizine dihydrochloride) .[name].. 1 tab by mouth daily 5)  Ventolin Hfa 108 (90 Base) Mcg/act Aers (Albuterol sulfate) .... 2 puffs every 4 hours as needed shortness of breath 6)  Aciphex 20 Mg Tbec (Rabeprazole sodium) .[name].. 1 by mouth once daily 7)  Advair Diskus 100-50 Mcg/dose Misc (Fluticasone-salmeterol) .[name].. 1 inhalation two times a day 8)  Air Cast Arm Splint  .... Left medial epidondylitis 726.31 9)  Nasacort Aq 55 Mcg/act Aers (Triamcinolone acetonide) .... 2 sprays each nostril once daily 10)  Celebrex 200 Mg Caps (Celecoxib) .[name].. 1 cap by mouth daily as needed pain 11)  Bactrim Ds 800-160 Mg Tabs (Sulfamethoxazole-trimethoprim) .[name].. 1 tab by mouth two times a day for 10 days.  Patient Instructions: 1)  Keep legs elevated at all times except to eat and use bathroom 2)  Call if no improvement over the week. Prescriptions: BACTRIM DS 800-160 MG TABS (SULFAMETHOXAZOLE-TRIMETHOPRIM) 1 tab by mouth  two times a day for 10 days.  #20 x 0   Entered and Authorized by:   Julieanne Manson MD   Signed by:   Julieanne Manson MD on 10/01/2010   Method used:   Faxed to ...       [facility] - [facility] (retail)       [address] [place], [place]  [address]       Ph: [phone]       Fax: [phone]   RxID:   [id number]    Orders Added: 1)  Est. Patient Level III [phone]

## 2010-10-11 ENCOUNTER — Telehealth (INDEPENDENT_AMBULATORY_CARE_PROVIDER_SITE_OTHER): Payer: Self-pay | Admitting: Internal Medicine

## 2010-10-14 ENCOUNTER — Encounter (INDEPENDENT_AMBULATORY_CARE_PROVIDER_SITE_OTHER): Payer: Self-pay | Admitting: Internal Medicine

## 2010-10-21 NOTE — Progress Notes (Signed)
Summary: finger tips numb and pain at back of hand  Phone Note Call from Patient   Reason for Call: Talk to Nurse Complaint: Chest Pain, Breathing Problems Summary of Call: Wants to be seen asap... still have pain in fingertips and recent pain at back of hands Initial call taken by: Nicholaus Bloom,  October 11, 2010 11:47 AM  Follow-up for Phone Call        Left message on answering machine for pt. to return call.  Dutch Quint RN  October 11, 2010 4:50 PM  Left message on answering machine for pt. to return call.  Dutch Quint RN  October 12, 2010 4:49 PM  Left message on answering machine for pt. to return call.  Dutch Quint RN  October 13, 2010 5:14 PM  Left message on answering machine for pt. to return call.  Letter sent.  Dutch Quint RN  October 14, 2010 9:19 AM

## 2010-10-21 NOTE — Letter (Signed)
Summary: Generic Letter  [facility]  [address] [place], [place] [address]   Phone: [phone]  Fax: [phone]        10/14/2010  ANDRIANNA MANALANG [address] [place], [place]  [address]  [place]  Dear Ms. Mckeone,  We have been unable to contact you by telephone.  Please call our office, at your earliest convenience, so that we may speak with you regarding your recent call to this office.  Sincerely,   Dutch Quint RN

## 2010-11-18 ENCOUNTER — Telehealth: Payer: Self-pay | Admitting: Pulmonary Disease

## 2010-11-18 NOTE — Telephone Encounter (Signed)
Received paperwork from RA for "[facility]" for pt.  Per RA, pt needs appt with TP to fill these out.  No pending appts.  LMOM TCB x1.

## 2010-11-22 NOTE — Telephone Encounter (Signed)
LMOM TCB x2

## 2010-11-26 NOTE — Telephone Encounter (Signed)
Per epic, pt has upcoming appt w/ TP 6.21.12.  Will sign off on message and keep forms for upcoming appt.

## 2010-12-07 NOTE — Assessment & Plan Note (Signed)
[facility]                             PULMONARY OFFICE NOTE   Mariah [name], Mariah [name]                      MRN:          [id number]  DATE:05/21/2007                            DOB:          09/03/1953    SUBJECTIVE:  Mariah [name] is a 60 year old woman who has been followed by  Dr. Sung Amabile for mild intermittent asthma.  She has been maintained on  scheduled Advair 100/50 b.i.d.  She rarely uses albuterol.  She returns  today complaining of about two weeks of worsening nasal drainage that  sometimes is green.  She has also had right ear fullness and pain.  This  may have  been secondary to some water damage and sewer damage in her  current residence and some exposure to mold, mildew, and fumes.  She  denies any significant change in her cough.  She has not had any  increased shortness of breath, and she has not been using her albuterol.  She has experienced wheeze and subjective chest tightness.   MEDICATIONS:  1. Advair 100/50 1 inhalation b.i.d.  2. Clonidine 0.2 mg b.i.d.  3. Lasix 80 mg in the morning, 40 mg in the evening.  4. Protonix 40 mg daily.  5. Norvasc 5 mg daily.  6. Loratadine 10 mg daily.  7. Celebrex 200 mg daily.  8. Anaprox p.r.n.  9. Albuterol 2 puffs q.4h. p.r.n.   PHYSICAL EXAMINATION:  This is a pleasant, obese woman who is in no  distress on room air.  Her weight is 224 pounds.  Temperature 97.5.  Blood pressure 124/76.  Heart rate 89.  SpO2 98% on room air.  HEENT:  Posterior pharynx has some mild posterior pharyngeal erythema.  She has evidence of postnasal drip.  Her tympanic membranes are easily  visualized and are normal.  NECK:  Supple without lymphadenopathy or stridor.  LUNGS:  Significant for some coarse expiratory breath sounds with some  end-expiratory wheezes.  HEART:  Regular without a murmur.  ABDOMEN:  Obese, soft and nontender with positive bowel sounds.  EXTREMITIES:  Without no clubbing, cyanosis or  edema.   IMPRESSION:  1. Mild intermittent asthma with a current exacerbation, in the      absence of any purulent sputum.  I do not know whether this is      related to her recent exposure to water damage in her home or      whether  this is related to the fall allergy season.  She does      suspect that her allergic rhinitis has been worse over the last      several weeks.  I will treat her with a prednisone burst of five      days, 40 mg daily.  If she has continued symptoms, she will contact      [place], and we will plan further therapy at that time.  Of note, she      has not been seen by Dr. Sung Amabile in over a year and probably merits      repeat spirometry once she is back at  her baseline.  2. Allergic rhinitis:  I will ask her to start Nasacort AQ 2 sprays      each nostril daily and to continue her Loratadine as ordered.     Leslye Peer, MD  Electronically Signed    RSB/MedQ  DD: 05/21/2007  DT: 05/22/2007  Job #: [id number]   cc:   Oley Balm. Sung Amabile, MD

## 2010-12-10 NOTE — Consult Note (Signed)
NAME:  Mariah [name], Mariah [name]                         ACCOUNT NO.:  [id number]   MEDICAL RECORD NO.:  [id number]                   PATIENT TYPE:  OUT   LOCATION:  GYN                                  FACILITY:  [facility]   PHYSICIAN:  De Blanch, M.D.         DATE OF BIRTH:  09/03/1953   DATE OF CONSULTATION:  05/28/2002  DATE OF DISCHARGE:                                   CONSULTATION   HISTORY OF PRESENT ILLNESS:  The patient is a 60 year old African-American  female seen in consultation at the request of Dr. Jeanine Luz.   The patient has been a long-time patient of Dr. Randell Patient, having previously  undergone two myomectomies, in 1997 and 1988.  She has never been pregnant  but is desirous of maintaining potential fertility.  On recent examination  she had a Pap smear that showed glandular cell abnormality, and the cells  were thought to be arising from the endocervix.   The patient reports that she is not having any irregular bleeding or pelvic  pain or pressure or discharge.  She has no GI or GU symptoms, although she  does note some bloating of the abdomen.   PAST MEDICAL HISTORY:  1. Obesity.  2. Hypertension.   PAST SURGICAL HISTORY:  1. Myomectomy in 1989, 1997.  2. Arthroscopy of left knee.   DRUG ALLERGIES:  PENICILLIN and CODEINE.   FAMILY HISTORY:  Negative for breast and colon and ovarian cancer.  The  patient's mother had endometrial cancer.   REVIEW OF SYSTEMS:  Otherwise negative.   PHYSICAL EXAMINATION:  VITAL SIGNS:  Weight 209 pounds, height 5 feet 1  inch.  Blood pressure 132/88, pulse 88.  GENERAL:  Obese black female in no acute distress.  HEENT:  Negative.  NECK:  Supple.  Without thyromegaly.  There is no supraclavicular or  inguinal adenopathy.  ABDOMEN:  Distended.  She has a well-healed midline scar.  It is difficult  to determine whether there is any evidence of ascites or mass given her  obesity and lack of ability to relax.  PELVIC:  EGBUS,  vagina, bladder, urethra are normal.  Cervix is normal.  It  is difficult to palpate the extent of the uterus, again, because of the  patient's abdominal distention and lack of relaxation.  I get the sense that  it is slightly enlarged.   PROCEDURE:  Colposcopy is performed of the vagina and cervix.  No lesions  are noted.  Endocervical and endometrial biopsies are obtained.  The uterus  sounds to approximately 10 cm.    IMPRESSION:  Atypical glandular cells on Pap smear, questionable origin.  We  will await biopsy report before making any further plans or recommendations.  De Blanch, M.D.    DC/MEDQ  D:  05/29/2002  T:  05/29/2002  Job:  [id number]   cc:   Almedia Balls. Fore, M.D.  [phone] [address] [place]  [place] [address]  Fax: [phone]   Telford Nab, R.N.  [address] [place], [place] [address]  Fax: 1

## 2011-01-08 ENCOUNTER — Encounter: Payer: Self-pay | Admitting: Adult Health

## 2011-01-13 ENCOUNTER — Ambulatory Visit: Payer: Self-pay | Admitting: Adult Health

## 2011-03-18 ENCOUNTER — Ambulatory Visit (INDEPENDENT_AMBULATORY_CARE_PROVIDER_SITE_OTHER): Payer: Self-pay | Admitting: Adult Health

## 2011-03-18 ENCOUNTER — Encounter: Payer: Self-pay | Admitting: Adult Health

## 2011-03-18 VITALS — BP 154/92 | HR 100 | Temp 96.9°F | Ht 61.0 in | Wt 227.4 lb

## 2011-03-18 DIAGNOSIS — J209 Acute bronchitis, unspecified: Secondary | ICD-10-CM

## 2011-03-18 DIAGNOSIS — J40 Bronchitis, not specified as acute or chronic: Secondary | ICD-10-CM

## 2011-03-18 MED ORDER — ALBUTEROL SULFATE (2.5 MG/3ML) 0.083% IN NEBU
2.5000 mg | INHALATION_SOLUTION | Freq: Once | RESPIRATORY_TRACT | Status: AC
  Administered 2011-03-18: 2.5 mg via RESPIRATORY_TRACT

## 2011-03-18 MED ORDER — MOXIFLOXACIN HCL 400 MG PO TABS: 400.0000 mg | ORAL_TABLET | Freq: Every day | ORAL | Status: AC

## 2011-03-18 MED ORDER — PREDNISONE 10 MG PO TABS: ORAL_TABLET | ORAL | Status: AC

## 2011-03-18 NOTE — Assessment & Plan Note (Signed)
Sllow to resolve flare  cxr pending.   Plan:   Avelox 400mg  daily for 7 days -take with food Prednisone taper over next week.  Mucinex DM Twice daily  As needed  Cough congestion  Increase Advair 250/50 1 puff until sample is gone and then back to 100/50 Twice daily   follow up Dr. Vassie Loll  In 2-3 weeks and As needed   Please contact office for sooner follow up if symptoms do not improve or worsen or seek emergency care

## 2011-03-18 NOTE — Progress Notes (Signed)
Addended by: Marcellus Scott on: 03/18/2011 04:49 PM   Modules accepted: Orders

## 2011-03-18 NOTE — Progress Notes (Signed)
Subjective:    Patient ID: Mariah [name], female    DOB: 09/03/1953, 60 y.o.   MRN: [id number]  HPI 60 year old AAF patient with persistent Asthma well controlled on advair - when compliant.   02/19/08-- Presents for for 2 week of cough, congestion- yellow thick mucus and wheezing. Ran out of advair. Given avelox & pred taper   7/30 ER visit for exacerbation - improved with nebs - admits to not using advair  9/ 25,/09 --complains of 2 weeks of cough, congesiton, wheezing. Says she has not ran out Advair but does not have insurance and depends on samples or help from [facility]. --rx doxycycline   July 03, 2008--Complains of nasal congestion, sinus pressure, cough thick mucus. teeth pain. for 2 weeks. Seen by dentist yesterday, dental films showed ?sinus infection.   September 13, 2010 11:26 AM - Last Ov aug'10  Living in a shelter now, cut her lasix in half, c/o clear phlegm now  c/o productive cough with clear mucus, DOE, wheezing, headaches, sinuse pressure, intermittent sharp pain in right ear, and increased stress. Has to wake up at 5:30am because she had to move into a shelter. Finished zpak for sinus infection prescribed by Dr. Delrae Alfred.    03/18/2011 Acute  Pt presents for an acute office visit. Complains of persistent cough and congestion. Initially treated with zpack and steroids but never got over this and now for the last 2 weeks cough is getting worse along with wheezing.  She has a lot of drainage.  She is living with family member now. Still looking for work. Currently looking into disability.  No chest pain or hemoptysis . No edema    Review of Systems Constitutional:   No  weight loss, night sweats,  Fevers, chills, + fatigue, or  lassitude.  HEENT:   No headaches,  Difficulty swallowing,  Tooth/dental problems, or  Sore throat,                No sneezing, itching, ear ache, nasal congestion, post nasal drip,   CV:  No chest pain,  Orthopnea, PND, swelling in lower  extremities, anasarca, dizziness, palpitations, syncope.   GI  No heartburn, indigestion, abdominal pain, nausea, vomiting, diarrhea, change in bowel habits, loss of appetite, bloody stools.   Resp:   No coughing up of blood.     No chest wall deformity  Skin: no rash or lesions.  GU: no dysuria, change in color of urine, no urgency or frequency.  No flank pain, no hematuria   MS:  No joint pain or swelling.  No decreased range of motion.  No back pain.  Psych:  No change in mood or affect. No depression or anxiety.  No memory loss.         Objective:   Physical Exam GEN: A/Ox3; pleasant , NAD   HEENT:  [place]/AT,  EACs-clear, TMs-wnl, NOSE-clear, THROAT-clear drainage , no lesions, no postnasal drip or exudate noted.   NECK:  Supple w/ fair ROM; no JVD; normal carotid impulses w/o bruits; no thyromegaly or nodules palpated; no lymphadenopathy.  RESP  Coarse BS w/ few rhonchi, no accessory muscle use, no dullness to percussion  CARD:  RRR, no m/r/g  , no peripheral edema, pulses intact, no cyanosis or clubbing.  GI:   Soft & nt; nml bowel sounds; no organomegaly or masses detected.  Musco: Warm bil, no deformities or joint swelling noted.   Neuro: alert, no focal deficits noted.    Skin: Warm,  no lesions or rashes         Assessment & Plan:

## 2011-03-18 NOTE — Patient Instructions (Signed)
Avelox 400mg  daily for 7 days -take with food Prednisone taper over next week.  Mucinex DM Twice daily  As needed  Cough congestion  Increase Advair 250/50 1 puff until sample is gone and then back to 100/50 Twice daily   follow up Dr. Vassie Loll  In 2-3 weeks and As needed   Please contact office for sooner follow up if symptoms do not improve or worsen or seek emergency care

## 2011-04-08 ENCOUNTER — Ambulatory Visit: Payer: Self-pay | Admitting: Pulmonary Disease

## 2011-04-21 ENCOUNTER — Telehealth: Payer: Self-pay | Admitting: Pulmonary Disease

## 2011-04-21 NOTE — Telephone Encounter (Signed)
Called, spoke with pt.  States she did not fill the rxs given at last OV with TP for avelox and pred taper because she misplaced them.  States her symptoms "temporiarly" cleared up but c/o rattling in chest, congestion, prod cough with yellow mucus, increased SOB "off and on," chest tightness "off and on," wheezing, HA in between eyes and in sinus area, and blowing nose with yellow mucus with streaks of blood mixed in x 1 wk.  Ov scheduled with TP for tomorrow at 4:30 -- pt aware and ok with this.

## 2011-04-22 ENCOUNTER — Ambulatory Visit: Payer: Self-pay | Admitting: Adult Health

## 2011-05-27 ENCOUNTER — Ambulatory Visit (INDEPENDENT_AMBULATORY_CARE_PROVIDER_SITE_OTHER)
Admission: RE | Admit: 2011-05-27 | Discharge: 2011-05-27 | Disposition: A | Discharge status: Home / Self Care | Payer: Self-pay | Source: Ambulatory Visit | Attending: Adult Health | Admitting: Adult Health

## 2011-05-27 ENCOUNTER — Ambulatory Visit (INDEPENDENT_AMBULATORY_CARE_PROVIDER_SITE_OTHER): Payer: Self-pay | Admitting: Adult Health

## 2011-05-27 ENCOUNTER — Encounter: Payer: Self-pay | Admitting: Adult Health

## 2011-05-27 ENCOUNTER — Telehealth: Payer: Self-pay | Admitting: Pulmonary Disease

## 2011-05-27 VITALS — BP 150/88 | HR 95 | Temp 98.4°F | Ht 61.0 in | Wt 226.2 lb

## 2011-05-27 DIAGNOSIS — J209 Acute bronchitis, unspecified: Secondary | ICD-10-CM

## 2011-05-27 DIAGNOSIS — J40 Bronchitis, not specified as acute or chronic: Secondary | ICD-10-CM

## 2011-05-27 MED ORDER — LEVALBUTEROL HCL 0.63 MG/3ML IN NEBU
0.6300 mg | INHALATION_SOLUTION | Freq: Once | RESPIRATORY_TRACT | Status: AC
  Administered 2011-05-27: 0.63 mg via RESPIRATORY_TRACT

## 2011-05-27 MED ORDER — LEVOFLOXACIN 500 MG PO TABS: 500.0000 mg | ORAL_TABLET | Freq: Every day | ORAL | Status: AC

## 2011-05-27 NOTE — Progress Notes (Signed)
Addended by: Tommie Sams on: 05/27/2011 03:02 PM   Modules accepted: Orders

## 2011-05-27 NOTE — Progress Notes (Signed)
Subjective:    Patient ID: Mariah [name], female    DOB: 09/03/1953, 60 y.o.   MRN: [id number]  HPI 60 year old AAF patient with persistent Asthma well controlled on advair - when compliant.   02/19/08-- Presents for for 2 week of cough, congestion- yellow thick mucus and wheezing. Ran out of advair. Given avelox & pred taper   7/30 ER visit for exacerbation - improved with nebs - admits to not using advair  9/ 25,/09 --complains of 2 weeks of cough, congesiton, wheezing. Says she has not ran out Advair but does not have insurance and depends on samples or help from [facility]. --rx doxycycline   July 03, 2008--Complains of nasal congestion, sinus pressure, cough thick mucus. teeth pain. for 2 weeks. Seen by dentist yesterday, dental films showed ?sinus infection.   September 13, 2010 11:26 AM - Last Ov aug'10  Living in a shelter now, cut her lasix in half, c/o clear phlegm now  c/o productive cough with clear mucus, DOE, wheezing, headaches, sinuse pressure, intermittent sharp pain in right ear, and increased stress. Has to wake up at 5:30am because she had to move into a shelter. Finished zpak for sinus infection prescribed by Dr. Delrae Alfred.    03/18/11  Acute  Pt presents for an acute office visit. Complains of persistent cough and congestion. Initially treated with zpack and steroids but never got over this and now for the last 2 weeks cough is getting worse along with wheezing.  She has a lot of drainage.  She is living with family member now. Still looking for work. Currently looking into disability.  No chest pain or hemoptysis . No edema  >>rx Avelox and steroid taper.   05/27/2011 Acute OV  Pt complains of  cough w/ green to yellow phlem, nasal congestion, wheezing, chest tightness, headache for 1 week. Had this 3 months ago got better with abx. HAs had some congestion on /off for last 3 months. Sinus congestion gets worse at night. She has been using over-the-counter cold  products with some relief. Feels cough and congestion are getting worse for the last 3 days. She is currently being evaluated for disability. She gets her prescriptions filled at [facility]. She is currently on a orange card. Patient's assistance program. She says she does have to skip some of her medicines to make sure she does not run out.  Review of Systems  Constitutional:   No  weight loss, night sweats,  Fevers, chills, + fatigue, or  lassitude.  HEENT:   No headaches,  Difficulty swallowing,  Tooth/dental problems, or  Sore throat,                No sneezing, itching, ear ache,  ++nasal congestion, post nasal drip,   CV:  No chest pain,  Orthopnea, PND,   anasarca, dizziness, palpitations, syncope.   GI  No heartburn, indigestion, abdominal pain, nausea, vomiting, diarrhea, change in bowel habits, loss of appetite, bloody stools.   Resp:   No coughing up of blood.     No chest wall deformity  Skin: no rash or lesions.  GU: no dysuria, change in color of urine, no urgency or frequency.  No flank pain, no hematuria   MS:  No joint pain or swelling.  No decreased range of motion.  No back pain.  Psych:  No change in mood or affect. No depression or anxiety.  No memory loss.         Objective:  Physical Exam  GEN: A/Ox3; pleasant , NAD   HEENT:  [place]/AT,  EACs-clear, TMs-wnl, NOSE-clear, THROAT-clear drainage , no lesions, no postnasal drip or exudate noted.   NECK:  Supple w/ fair ROM; no JVD; normal carotid impulses w/o bruits; no thyromegaly or nodules palpated; no lymphadenopathy.  RESP  Coarse BS w/ few rhonchi, no accessory muscle use, no dullness to percussion  CARD:  RRR, no m/r/g  , 1+peripheral edema, pulses intact, no cyanosis or clubbing.  GI:   Soft & nt; nml bowel sounds; no organomegaly or masses detected.  Musco: Warm bil, no deformities or joint swelling noted.   Neuro: alert, no focal deficits noted.    Skin: Warm, no lesions or  rashes         Assessment & Plan:

## 2011-05-27 NOTE — Assessment & Plan Note (Signed)
Flare   Plan:  Levaquin 500mg  daily -take with food Mucinex DM Twice daily  As needed  Cough congestion  Increase Advair 250/50 1 puff until sample is gone and then back to 100/50 Twice daily   Please Take Furosemide daily  follow up Dr. Vassie Loll  In 4 weeks and As needed   Please contact office for sooner follow up if symptoms do not improve or worsen or seek emergency care

## 2011-05-27 NOTE — Patient Instructions (Addendum)
Levaquin 500mg  daily -take with food Mucinex DM Twice daily  As needed  Cough congestion  Increase Advair 250/50 1 puff until sample is gone and then back to 100/50 Twice daily   Please Take Furosemide daily  follow up Dr. Vassie Loll  In 4 weeks and As needed   Please contact office for sooner follow up if symptoms do not improve or worsen or seek emergency care

## 2011-05-27 NOTE — Telephone Encounter (Signed)
Error

## 2011-05-31 ENCOUNTER — Encounter: Payer: Self-pay | Admitting: *Deleted

## 2011-06-10 ENCOUNTER — Telehealth: Payer: Self-pay | Admitting: Pulmonary Disease

## 2011-06-10 MED ORDER — CLONIDINE HCL 0.2 MG PO TABS: 0.2000 mg | ORAL_TABLET | Freq: Two times a day (BID) | ORAL | Status: DC

## 2011-06-10 MED ORDER — AMLODIPINE BESYLATE 10 MG PO TABS: 5.0000 mg | ORAL_TABLET | Freq: Every day | ORAL | Status: DC

## 2011-06-10 NOTE — Telephone Encounter (Signed)
Pt states her pharmacy and PCP office are closed. Unable to pick up her BP medications. Requesting refills on clonidine and amlodipine to be sent to [facility] to allow her enough time to find a ride and the money to pick up same. RA and TP are both out of the office and per TD okay to send 30 day supply x 1 and will not be able to do additional fills after that. Pt verbalized understanding.

## 2011-06-21 ENCOUNTER — Telehealth: Payer: Self-pay | Admitting: Pulmonary Disease

## 2011-06-21 MED ORDER — AMLODIPINE BESYLATE 10 MG PO TABS: 5.0000 mg | ORAL_TABLET | Freq: Every day | ORAL | Status: DC

## 2011-06-21 MED ORDER — CLONIDINE HCL 0.2 MG PO TABS: 0.2000 mg | ORAL_TABLET | Freq: Two times a day (BID) | ORAL | Status: DC

## 2011-06-21 NOTE — Telephone Encounter (Signed)
Pt says her clonidine and amlodipine have not been called to her pharmacy. After researching this, it looks like the prescriptions were sent to [facility] on [address] Instead of [facility] on [address]. Medications resent to the correct pharmacy and [facility] notified of the error.

## 2011-06-21 NOTE — Telephone Encounter (Signed)
I spoke with pt and she states she is going to call me back

## 2011-07-11 ENCOUNTER — Ambulatory Visit: Payer: Self-pay | Admitting: Pulmonary Disease

## 2011-07-20 ENCOUNTER — Ambulatory Visit: Payer: Self-pay | Admitting: Pulmonary Disease

## 2011-08-12 ENCOUNTER — Ambulatory Visit: Payer: Self-pay | Admitting: Pulmonary Disease

## 2011-08-24 ENCOUNTER — Ambulatory Visit: Payer: Self-pay | Admitting: Pulmonary Disease

## 2011-09-15 ENCOUNTER — Encounter: Payer: Self-pay | Admitting: *Deleted

## 2011-09-15 ENCOUNTER — Ambulatory Visit (INDEPENDENT_AMBULATORY_CARE_PROVIDER_SITE_OTHER): Payer: Self-pay | Admitting: Pulmonary Disease

## 2011-09-15 ENCOUNTER — Encounter: Payer: Self-pay | Admitting: Pulmonary Disease

## 2011-09-15 VITALS — BP 148/84 | HR 88 | Temp 97.7°F | Ht 61.0 in | Wt 236.6 lb

## 2011-09-15 DIAGNOSIS — J45909 Unspecified asthma, uncomplicated: Secondary | ICD-10-CM

## 2011-09-15 MED ORDER — FLUTICASONE-SALMETEROL 100-50 MCG/DOSE IN AEPB: 1.0000 | INHALATION_SPRAY | Freq: Two times a day (BID) | RESPIRATORY_TRACT | Status: DC

## 2011-09-15 NOTE — Assessment & Plan Note (Signed)
Letter for disability given Advair 100/50 sample & Rx for [facility] Use ventolin MDI for rescue Her asthma control is tied in to her  Social issues

## 2011-09-15 NOTE — Patient Instructions (Signed)
Letter for disability Advair 100/50 sample & Rx for [facility]

## 2011-09-15 NOTE — Progress Notes (Signed)
  Subjective:    Patient ID: Mariah [name], female    DOB: 09/03/1953, 61 y.o.   MRN: [id number]  HPI 61 year old AAF patient with persistent Asthma well controlled on advair - when compliant.  02/19/08-- Presents for for 2 week of cough, congestion- yellow thick mucus and wheezing. Ran out of advair. Given avelox & pred taper  7/30 ER visit for exacerbation - improved with nebs - admits to not using advair    09/15/2011 Unable to get Rx from [facility] -  Cannot afford Living at a relative's place. 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 Pleasant, obese, in no distress ENT - no lesions, no post nasal drip Neck: No JVD, no thyromegaly, no carotid bruits Lungs: no use of accessory muscles, no dullness to percussion, decreased without rales or rhonchi  Cardiovascular: Rhythm regular, heart sounds  normal, no murmurs or gallops, 2+ peripheral edema Musculoskeletal: No deformities, no cyanosis or clubbing , no tremors       Assessment & Plan:

## 2011-11-07 ENCOUNTER — Encounter: Payer: Self-pay | Admitting: Advanced Practice Midwife

## 2011-11-09 ENCOUNTER — Encounter (HOSPITAL_COMMUNITY): Payer: Self-pay

## 2011-11-09 ENCOUNTER — Emergency Department (HOSPITAL_COMMUNITY)
Admission: EM | Admit: 2011-11-09 | Discharge: 2011-11-09 | Disposition: A | Discharge status: Home / Self Care | Payer: Self-pay | Attending: Emergency Medicine | Admitting: Emergency Medicine

## 2011-11-09 DIAGNOSIS — K219 Gastro-esophageal reflux disease without esophagitis: Secondary | ICD-10-CM | POA: Insufficient documentation

## 2011-11-09 DIAGNOSIS — M25539 Pain in unspecified wrist: Secondary | ICD-10-CM | POA: Insufficient documentation

## 2011-11-09 DIAGNOSIS — R209 Unspecified disturbances of skin sensation: Secondary | ICD-10-CM | POA: Insufficient documentation

## 2011-11-09 DIAGNOSIS — R609 Edema, unspecified: Secondary | ICD-10-CM | POA: Insufficient documentation

## 2011-11-09 DIAGNOSIS — J45909 Unspecified asthma, uncomplicated: Secondary | ICD-10-CM | POA: Insufficient documentation

## 2011-11-09 DIAGNOSIS — M79609 Pain in unspecified limb: Secondary | ICD-10-CM | POA: Insufficient documentation

## 2011-11-09 DIAGNOSIS — M25439 Effusion, unspecified wrist: Secondary | ICD-10-CM | POA: Insufficient documentation

## 2011-11-09 DIAGNOSIS — Z79899 Other long term (current) drug therapy: Secondary | ICD-10-CM | POA: Insufficient documentation

## 2011-11-09 DIAGNOSIS — M25532 Pain in left wrist: Secondary | ICD-10-CM

## 2011-11-09 DIAGNOSIS — M25449 Effusion, unspecified hand: Secondary | ICD-10-CM | POA: Insufficient documentation

## 2011-11-09 DIAGNOSIS — I10 Essential (primary) hypertension: Secondary | ICD-10-CM | POA: Insufficient documentation

## 2011-11-09 MED ORDER — HYDROCODONE-ACETAMINOPHEN 5-500 MG PO TABS: 1.0000 | ORAL_TABLET | Freq: Four times a day (QID) | ORAL | Status: DC | PRN

## 2011-11-09 MED ORDER — PROMETHAZINE HCL 12.5 MG PO TABS: 12.5000 mg | ORAL_TABLET | Freq: Four times a day (QID) | ORAL | Status: DC | PRN

## 2011-11-09 MED ORDER — OXYCODONE-ACETAMINOPHEN 5-325 MG PO TABS
1.0000 | ORAL_TABLET | Freq: Once | ORAL | Status: AC
  Administered 2011-11-09: 1 via ORAL
  Filled 2011-11-09: qty 1

## 2011-11-09 MED ORDER — ONDANSETRON 8 MG PO TBDP
8.0000 mg | ORAL_TABLET | Freq: Once | ORAL | Status: AC
  Administered 2011-11-09: 8 mg via ORAL
  Filled 2011-11-09: qty 1

## 2011-11-09 NOTE — ED Notes (Signed)
Pt reports L wrist pain, has hx of carpal tunnel.  Pt reports pain and swelling x 1 week.

## 2011-11-09 NOTE — ED Provider Notes (Signed)
History     CSN: [id number]  Arrival date & time 11/09/11  1300   First MD Initiated Contact with Patient 11/09/11 1438      Chief Complaint  Patient presents with  . Hand Pain    (Consider location/radiation/quality/duration/timing/severity/associated sxs/prior treatment) Patient is a 61 y.o. female presenting with wrist pain. The history is provided by the patient.  Wrist Pain This is a recurrent problem. The current episode started yesterday. The problem occurs constantly. The problem has been gradually worsening. Associated symptoms include joint swelling and numbness. Pertinent negatives include no chills, fever, nausea or weakness. The symptoms are aggravated by bending and twisting. She has tried NSAIDs for the symptoms. The treatment provided no relief.  Pt states she has carpal tunnel and tendonitis in left wrist. Pt states pain worsened yesterday. States wrist swelled up, it is tender, painful to move. Pain also worsened with using left hand.  Pt denies fever, chills, malaise. Iced it, elevated it with no relief. Has appointment with orthopedics in 6 days, couldn't get in sooner.  Past Medical History  Diagnosis Date  . Thumb pain     right  . Arthritis     right anke/foot  . Allergic rhinitis   . Asthma   . HTN (hypertension)   . GERD (gastroesophageal reflux disease)   . Tendinitis   . Knee pain     Past Surgical History  Procedure Date  . Uterine fibroid surgery 1988    Dr Randell Patient  . Repeat fibroid excision 1997    Dr Randell Patient  . Arthroscopic left knee surgery 1992  . Right heel surgery on plantar facitis 1995    Family History  Problem Relation Age of Onset  . Uterine cancer Mother   . Hypertension Mother   . Hypertension Father     History  Substance Use Topics  . Smoking status: Never Smoker   . Smokeless tobacco: Never Used  . Alcohol Use: No    OB History    Grav Para Term Preterm Abortions TAB SAB Ect Mult Living                  Review of  Systems  Constitutional: Negative for fever and chills.  Respiratory: Negative.   Cardiovascular: Negative.   Gastrointestinal: Negative.  Negative for nausea.  Musculoskeletal: Positive for joint swelling.  Skin: Negative.   Neurological: Positive for numbness. Negative for weakness.    Allergies  Codeine; Doxycycline; and Penicillins  Home Medications   Current Outpatient Rx  Name Route Sig Dispense Refill  . ALBUTEROL SULFATE HFA 108 (90 BASE) MCG/ACT IN AERS Inhalation Inhale 2 puffs into the lungs every 4 (four) hours as needed.      [name] AMLODIPINE BESYLATE 10 MG PO TABS Oral Take 0.5 tablets (5 mg total) by mouth daily. 30 tablet 1  . CELECOXIB 200 MG PO CAPS Oral Take 200 mg by mouth daily.     [name] CLONIDINE HCL 0.2 MG PO TABS Oral Take 1 tablet (0.2 mg total) by mouth 2 (two) times daily. 60 tablet 1  . CLOTRIMAZOLE-BETAMETHASONE 1-0.05 % EX CREA Topical Apply 1 application topically 2 (two) times daily.      . CYCLOBENZAPRINE HCL 10 MG PO TABS Oral Take 10 mg by mouth 3 (three) times daily as needed.      [name] FLUTICASONE-SALMETEROL 100-50 MCG/DOSE IN AEPB Inhalation Inhale 1 puff into the lungs every 12 (twelve) hours. 60 each 3  . FUROSEMIDE 40 MG PO TABS  Oral Take 40 mg by mouth daily as needed.     [name] LEVOCETIRIZINE DIHYDROCHLORIDE 5 MG PO TABS Oral Take 5 mg by mouth every evening.      [name] RABEPRAZOLE SODIUM 20 MG PO TBEC Oral Take 20 mg by mouth daily.      . TRAMADOL HCL 50 MG PO TABS Oral Take 50 mg by mouth every 8 (eight) hours as needed.      . TRIAMCINOLONE ACETONIDE 55 MCG/ACT NA INHA Nasal Place 2 sprays into the nose daily.        BP 173/84  Pulse 110  Temp 98.9 F (37.2 C)  Resp 16  SpO2 98%  Physical Exam  Constitutional: She is oriented to person, place, and time. She appears well-developed and well-nourished.       Uncomfortable appearing  HENT:  Head: Normocephalic.  Eyes: Conjunctivae are normal.  Neck: Neck supple.  Cardiovascular: Normal rate,  regular rhythm and normal heart sounds.   Pulmonary/Chest: Effort normal and breath sounds normal. No respiratory distress.  Musculoskeletal: She exhibits edema and tenderness.       Left wrist appears swollen. Tender to the touch over anterior aspect. Pain with any wrist movement. Pain with making a fist and gripping my fingers. Full ROM of all fingers.NOrmal radial pulse. Normal elbow  Neurological: She is alert and oriented to person, place, and time.  Skin: Skin is warm and dry.  Psychiatric: She has a normal mood and affect.    ED Course  Procedures (including critical care time)  Pt with no injury to her wrist. Hx of tendonitis and carpal tunnel. Multiple exacerbations in the past. States feels like prior times. Will give a velcro splint. Pain medications. Pt has an appointment with orthopedics in 6 days in [facility].   1. Wrist pain, left       MDM          Lottie Mussel, PA 11/09/11 1555

## 2011-11-09 NOTE — Discharge Instructions (Signed)
Keep your wrist elevated. Ice several times a day. Keep splint on at all times. Ibuprofen for pain. Take vicodin as prescribed as needed for severe pain. Follow up with your primary care doctor or orthopedics doctor as scheduled.   Wrist Pain Wrist injuries are frequent in adults and children. A sprain is an injury to the ligaments that hold your bones together. A strain is an injury to muscle or muscle cord-like structures (tendons) from stretching or pulling. Generally, when wrists are moderately tender to touch following a fall or injury, a break in the bone (fracture) may be present. Most wrist sprains or strains are better in 3 to 5 days, but complete healing may take several weeks. HOME CARE INSTRUCTIONS   Put ice on the injured area.   Put ice in a plastic bag.   Place a towel between your skin and the bag.   Leave the ice on for 15 to 20 minutes, 3 to 4 times a day, for the first 2 days.   Keep your arm raised above the level of your heart whenever possible to reduce swelling and pain.   Rest the injured area for at least 48 hours or as directed by your caregiver.   If a splint or elastic bandage has been applied, use it for as long as directed by your caregiver or until seen by a caregiver for a follow-up exam.   Only take over-the-counter or prescription medicines for pain, discomfort, or fever as directed by your caregiver.   Keep all follow-up appointments. You may need to follow up with a specialist or have follow-up X-rays. Improvement in pain level is not a guarantee that you did not fracture a bone in your wrist. The only way to determine whether or not you have a broken bone is by X-ray.  SEEK IMMEDIATE MEDICAL CARE IF:   Your fingers are swollen, very red, white, or cold and blue.   Your fingers are numb or tingling.   You have increasing pain.   You have difficulty moving your fingers.  MAKE SURE YOU:   Understand these instructions.   Will watch your condition.    Will get help right away if you are not doing well or get worse.  Document Released: 04/20/2005 Document Revised: 06/30/2011 Document Reviewed: 09/01/2010 [facility] Patient Information 2012 [place], [place].

## 2011-11-09 NOTE — ED Notes (Signed)
Pt calling family member for transport.

## 2011-11-09 NOTE — ED Notes (Signed)
Patient reports that she has left wrist/hand pain x 1 week, reports that she has carpel tunnel.

## 2011-11-09 NOTE — Progress Notes (Signed)
CM and [facility] community liasion noted [facility]24[facility]26[facility]13

## 2011-11-10 NOTE — ED Provider Notes (Signed)
Medical screening examination/treatment/procedure(s) were performed by non-physician practitioner and as supervising physician I was immediately available for consultation/collaboration.  Raeford Razor, MD 11/10/11 [phone]

## 2011-11-13 ENCOUNTER — Emergency Department (HOSPITAL_COMMUNITY): Payer: Self-pay

## 2011-11-13 ENCOUNTER — Encounter (HOSPITAL_COMMUNITY): Payer: Self-pay

## 2011-11-13 ENCOUNTER — Emergency Department (HOSPITAL_COMMUNITY)
Admission: EM | Admit: 2011-11-13 | Discharge: 2011-11-13 | Disposition: A | Discharge status: Home / Self Care | Payer: Self-pay | Attending: Emergency Medicine | Admitting: Emergency Medicine

## 2011-11-13 DIAGNOSIS — G5600 Carpal tunnel syndrome, unspecified upper limb: Secondary | ICD-10-CM | POA: Insufficient documentation

## 2011-11-13 DIAGNOSIS — Z79899 Other long term (current) drug therapy: Secondary | ICD-10-CM | POA: Insufficient documentation

## 2011-11-13 DIAGNOSIS — Z8739 Personal history of other diseases of the musculoskeletal system and connective tissue: Secondary | ICD-10-CM | POA: Insufficient documentation

## 2011-11-13 DIAGNOSIS — G5602 Carpal tunnel syndrome, left upper limb: Secondary | ICD-10-CM

## 2011-11-13 DIAGNOSIS — M778 Other enthesopathies, not elsewhere classified: Secondary | ICD-10-CM

## 2011-11-13 DIAGNOSIS — M65839 Other synovitis and tenosynovitis, unspecified forearm: Secondary | ICD-10-CM | POA: Insufficient documentation

## 2011-11-13 DIAGNOSIS — K219 Gastro-esophageal reflux disease without esophagitis: Secondary | ICD-10-CM | POA: Insufficient documentation

## 2011-11-13 DIAGNOSIS — J45909 Unspecified asthma, uncomplicated: Secondary | ICD-10-CM | POA: Insufficient documentation

## 2011-11-13 DIAGNOSIS — I10 Essential (primary) hypertension: Secondary | ICD-10-CM | POA: Insufficient documentation

## 2011-11-13 MED ORDER — OXYCODONE-ACETAMINOPHEN 5-325 MG PO TABS
1.0000 | ORAL_TABLET | Freq: Once | ORAL | Status: AC
  Administered 2011-11-13: 1 via ORAL
  Filled 2011-11-13: qty 1

## 2011-11-13 NOTE — Discharge Instructions (Signed)
Take percocet as needed for severe pain.  Do not drive within four hours of taking this medication (may cause drowsiness or confusion).  Continue your aleve.   Ice 2-3 times a day for 15-20 minutes, elevate when possible and avoid activities that aggravate pain.   Follow up with the orthopedic doctor.   You should return to the ER if you develop fever, skin changes or loss of sensation in fingers. Carpal Tunnel Syndrome Fact Sheet You are working at your desk, trying to ignore the tingling or numbness you have had for months in your hand and wrist. Suddenly, a sharp, piercing pain shoots through the wrist and up your arm. Just a passing cramp? More likely you have carpal tunnel syndrome. This is a painful progressive condition caused by compression of a key nerve in the wrist. WHAT IS CARPAL TUNNEL SYNDROME? Carpal tunnel syndrome occurs when the nerve that runs from the forearm into the hand (median nerve), becomes pressed or squeezed at the wrist. The median nerve controls sensations to the palm side of the thumb and fingers (although not the little finger). That nerve also controls impulses to some small muscles in the hand, that allow the fingers and thumb to move. The narrow, rigid passageway of ligament and bones at the base of the hand (carpal tunnel) houses the median nerve and tendons. Sometimes, thickening from irritated tendons or other swelling narrows the tunnel. The narrowing causes the median nerve to be compressed. The result may be pain, weakness, or numbness in the hand and wrist that moves (radiates) up the arm. Although painful sensations may indicate other conditions, carpal tunnel syndrome is the most common and widely known of the entrapment neuropathies. These are conditions in which the body's peripheral nerves are compressed or traumatized. SYMPTOMS   Symptoms usually start gradually, with:   Frequent burning.   Tingling.   Itching numbness in the palm of the hand and the  fingers.  This is especially true in the thumb and the index and middle fingers.  Some carpal tunnel sufferers say their fingers feel useless and swollen. They may feel this way even though little or no swelling is apparent.   The symptoms often first appear in one or both hands during the night, since many people sleep with flexed wrists. A person with carpal tunnel syndrome may wake up feeling the need to shake out the hand or wrist.   As symptoms worsen, people might feel tingling during the day.   Decreased grip strength may make it difficult to:   Form a fist.   Grasp small objects.   Perform other manual tasks.  In chronic and/or untreated cases, the muscles at the base of the thumb may waste away.  Some people are unable to tell between hot and cold by touch.  CAUSES   Carpal tunnel syndrome is often the result of a combination of factors that increase pressure on the median nerve and tendons in the carpal tunnel. It is usually not a problem with the nerve itself.   Most likely the disorder is due to a congenital predisposition. This means that the carpal tunnel is simply smaller in some people than in others.   Other factors include:   Trauma or injury to the wrist that cause swelling, such as sprain or fracture.   Overactivity of the pituitary gland.   Hypothyroidism.   Rheumatoid arthritis.   Mechanical problems in the wrist joint.   Work stress.   Repeated use of  vibrating hand tools.   Fluid retention during pregnancy or menopause.   The development of a cyst or tumor in the canal.   In some cases, no cause can be identified.   There is little clinical data to prove whether repetitive and forceful movements of the hand and wrist during work or leisure activities can cause carpal tunnel syndrome. Repeated motions performed in the course of normal work or other daily activities can result in repetitive motion disorders such as bursitis and tendonitis. Writer's  cramp, a condition in which a lack of fine motor skill coordination and ache and pressure in the fingers, wrist, or forearm is brought on by repetitive activity, is not a symptom of carpal tunnel syndrome.  WHO IS AT RISK OF DEVELOPING CARPAL TUNNEL SYNDROME?  Women are three times more likely than men to develop carpal tunnel syndrome. This may be the result of the carpal tunnel being smaller in women.   The dominant hand is usually affected first and produces the most severe pain.   Diabetes or other metabolic disorders that directly affect the body's nerves make people more susceptible to compression.   Carpal tunnel syndrome usually occurs only in adults.   The risk of developing carpal tunnel syndrome is not confined to people in a single industry or job. However, it is especially common in those performing assembly line work, such as:   Set designer.   Sewing.   Finishing.   Cleaning.   Meat packing.  Carpal tunnel syndrome is three times more common among assemblers than among data-entry personnel. A 2001 study by the [facility] found heavy computer use (up to 7 hours a day) did not increase a person's risk of developing carpal tunnel syndrome.  During 1998, an estimated three of every 10,000 workers lost time from work because of carpal tunnel syndrome. Half of these workers missed more than 10 days of work. The average lifetime cost of carpal tunnel syndrome is estimated to be about $30,000 for each injured worker. This includes medical bills and lost time from work  DIAGNOSIS   Early diagnosis and treatment are important to avoid permanent damage to the median nerve.   A physical examination of the hands, arms, shoulders, and neck can help determine if the patient's complaints are related to daily activities or to an underlying disorder. An exam can rule out other painful conditions that mimic carpal tunnel syndrome.   The wrist is examined for:   Tenderness.    Swelling.   Warmth.   Discoloration.   Each finger should be tested for sensation. The muscles at the base of the hand should be examined for strength and signs of shrinking (atrophy).   Routine laboratory tests and X-rays can reveal:   Diabetes.   Arthritis.   Fractures.   Physicians can use specific tests to try to produce the symptoms of carpal tunnel syndrome.   In the Tinel test, the caregiver taps or presses on the median nerve in the patient's wrist. The test is positive when tingling in the fingers or a shock-like sensation occurs.   The Phalen or wrist-flexion test involves having the patient hold their forearms upright by pointing the fingers down and pressing the backs of the hands together. The presence of carpal tunnel syndrome is suggested if one or more symptoms is felt in the fingers within 1 minute.   Caregivers may also ask patients to try to make a movement that brings on symptoms.   Often it is  necessary to confirm the diagnosis by use of electrodiagnostic tests.   In a nerve conduction study, electrodes are placed on the hand and wrist. Small electric shocks are applied and the speed with which nerves transmit impulses is measured.   In electromyography, a fine needle is inserted into a muscle. The electrical activity is viewed on a screen. The activity can determine the severity of damage to the median nerve.   Ultrasound imaging can show impaired movement of the median nerve.  TREATMENT  Treatments for carpal tunnel syndrome should begin as early as possible. It should be done under a caregiver's direction. Underlying causes such as diabetes or arthritis should be treated first. Initial treatment generally involves:  Resting the affected hand and wrist for at least 2 weeks.   Avoiding activities that may worsen symptoms.   Immobilizing (keeping from moving) the wrist in a splint.  These things are all done to avoid further damage from twisting or  bending. If there is inflammation, applying cool packs can help reduce swelling. NON-SURGICAL  Drugs - In special circumstances, different drugs can ease the pain and swelling.   Nonsteroidal anti-inflammatory drugs, such as aspirin, and other non-prescription pain relievers, may ease symptoms.   Orally administered diuretics (water pills) can decrease swelling.   Corticosteroids such as prednisone or lidocaine can be injected directly into the wrist or taken by mouth. This can relieve pressure on the median nerve. It may provide immediate, but temporary relief to persons with mild or intermittent symptoms. (Caution: persons with diabetes and those who may be predisposed to diabetes should note that long term use of corticosteroids can make it difficult to regulate insulin levels. Corticosterioids should not be taken without a prescription.)   Additionally, some studies show that vitamin B6 (pyridoxine) supplements may ease the symptoms of carpal tunnel syndrome.   Exercise - Stretching and strengthening exercises can be helpful in people whose symptoms have decreased. These exercises may be supervised by a physical therapist that is trained to use exercises to treat physical impairments. The exercises can also be supervised by an occupational therapist. This is someone who is trained in evaluating people with physical impairments. They will help them build skills to improve their health and well-being.   Alternative therapies - Acupuncture and chiropractic care have helped some patients. Their effectiveness remains unproved. An exception is yoga, which has been shown to reduce pain and improve grip strength among patients with carpal tunnel syndrome.  SURGERY Carpal tunnel release is a surgical procedure commonly used. It is generally recommended if symptoms last for 6 months or more. This surgery involves cutting the band of tissue around the wrist to reduce pressure on the median nerve. Surgery  is done under local anesthesia. It does not require an overnight hospital stay. Many patients require surgery on both hands. The following are types of carpal tunnel release surgery:  Open release surgery. This is the traditional procedure used to correct carpal tunnel syndrome. It consists of making a cut (incision) up to 2 inches in the wrist. Then, the surgeon cuts the carpal ligament to enlarge the carpal tunnel. The procedure is generally done under local anesthesia on an outpatient basis. The only exception is if there are unusual medical considerations.   Endoscopic surgery may allow faster recovery and less postoperative discomfort than traditional open release surgery. The surgeon makes two incisions (about 1/2" each) in the wrist and palm. The surgeon will:   Insert a camera attached to a tube.  Observe the tissue on a screen.   Cut the carpal ligament (the tissue that holds joints together).  This two-portal endoscopic surgery, generally performed under local anesthesia, is effective and minimizes scarring and scar tenderness. One-portal endoscopic surgery for carpal tunnel syndrome is also available. Although symptoms may be relieved immediately after surgery, full recovery from carpal tunnel surgery can take months. Some patients may have:  Infection.   Nerve damage.   Stiffness.   Pain at the scar.  Occasionally, the wrist loses strength because the carpal ligament is cut. Patients should undergo physical therapy after surgery to restore wrist strength. Some patients may need to adjust job duties or even change jobs after recovery from surgery. Recurrence of carpal tunnel syndrome following treatment is rare. The majority of patients recover completely. PREVENTION  At the workplace, workers can:   Do on-the-job conditioning, perform stretching exercises.   Take frequent rest breaks.   Wear splints to keep wrists straight.   Use correct posture and wrist position.    Wearing finger-less gloves can help keep hands warm and flexible.   Design workstations, tools and tool handles, and tasks to enable the worker's wrist to maintain a natural position.   Rotate jobs among workers.   Employers can develop programs in ergonomics. This is the process of adapting workplace conditions and job demands to the capabilities of workers. However, research has not conclusively shown that these workplace changes prevent the occurrence of carpal tunnel syndrome.  WHAT RESEARCH IS BEING DONE? The [employer] of Neurological Disorders and Stroke (NINDS), a part of the [employer], is the federal government's leading supporter of research on carpal tunnel syndrome. Scientists are studying the order of events that occur with carpal tunnel syndrome. They do this to better understand, treat, and prevent this ailment.  WHAT IS PERCUTANEOUS BALLOON CARPAL TUNNEL-PLASTY? Percutaneous balloon carpal tunnel-plasty is an experimental technique that can ease carpal tunnel pain without cutting the carpal ligament. In this procedure, a 1/4 -inch cut is made at the base of the palm. The caregiver then inserts a balloon through a catheter under the carpal ligament and inflates the balloon to stretch the ligament and free the nerve. Patients in one small study of this procedure reported relief of symptoms with no complications after the surgery. Most of them were back to work within 2 weeks. This experimental technique is not yet widely available. FOR MORE INFORMATION American Academy of Orthopaedic Surgeons: www.aaos.org  Centers for Disease Control and Prevention (CDCP): FootballExhibition.com.br [employer] of Arthritis and Musculoskeletal and Skin Diseases (NIAMS): www.niams.http://www.myers.net/ American Chronic Pain Association (ACPA): www.theacpa.org National Chronic Pain Outreach Association (NCPOA): www.chronicpain.org Occupational Safety & Health Administration:  ArmyDictionary.fi Document Released: 02/23/2004 Document Revised: 03/23/2011 Document Reviewed: 02/27/2008 [facility] Patient Information 2012 [place], [place].

## 2011-11-13 NOTE — ED Notes (Signed)
Pt seated at registration

## 2011-11-13 NOTE — ED Notes (Signed)
Pt in from home with left wrist pain states was recently seen and dx with sprained wrist states pain is severe states has not been able to get prescribed medication and pain has worsened

## 2011-11-13 NOTE — ED Provider Notes (Signed)
History     CSN: [id number]  Arrival date & time 11/13/11  1608   First MD Initiated Contact with Patient 11/13/11 1812      Chief Complaint  Patient presents with  . Wrist Pain    left    (Consider location/radiation/quality/duration/timing/severity/associated sxs/prior treatment) HPI History provided by pt.   Pt presents w/ severe, non-traumatic pain of left wrist and hand.  Per prior chart, seen for same in ED on 11/09/11, sx thought to be an exacerbation of her carpal tunnel and d/c'd home w/ percocet for pain and velcrow wrist splint.  Returns to the ED today because she was unable to afford the percocet and pain has persisted.  Describes as burning sensation of palm and shooting pain in thenar eminece and radial aspect of wrist.  Pain aggravated by ROM of all digits and wrist.  Associated w/ thenar eminence edema and decreased ROM of fingers.  Denies fever and skin changes.  Sx are similar to past flares of carpal tunnel.  Had an appointment scheduled with orthopedics in [place] tomorrow but cancelled because she doesn't think she can make the trip with her arm pain.    Past Medical History  Diagnosis Date  . Thumb pain     right  . Arthritis     right anke/foot  . Allergic rhinitis   . Asthma   . HTN (hypertension)   . GERD (gastroesophageal reflux disease)   . Tendinitis   . Knee pain     Past Surgical History  Procedure Date  . Uterine fibroid surgery 1988    Dr Randell Patient  . Repeat fibroid excision 1997    Dr Randell Patient  . Arthroscopic left knee surgery 1992  . Right heel surgery on plantar facitis 1995    Family History  Problem Relation Age of Onset  . Uterine cancer Mother   . Hypertension Mother   . Hypertension Father     History  Substance Use Topics  . Smoking status: Never Smoker   . Smokeless tobacco: Never Used  . Alcohol Use: No    OB History    Grav Para Term Preterm Abortions TAB SAB Ect Mult Living                  Review of Systems  All other  systems reviewed and are negative.    Allergies  Codeine; Doxycycline; Penicillins; and Vicodin  Home Medications   Current Outpatient Rx  Name Route Sig Dispense Refill  . ALBUTEROL SULFATE HFA 108 (90 BASE) MCG/ACT IN AERS Inhalation Inhale 2 puffs into the lungs every 4 (four) hours as needed. Shortness of breath    . AMLODIPINE BESYLATE 10 MG PO TABS Oral Take 0.5 tablets (5 mg total) by mouth daily. 30 tablet 1  . CLONIDINE HCL 0.2 MG PO TABS Oral Take 1 tablet (0.2 mg total) by mouth 2 (two) times daily. 60 tablet 1  . FLUTICASONE-SALMETEROL 100-50 MCG/DOSE IN AEPB Inhalation Inhale 1 puff into the lungs every 12 (twelve) hours. 60 each 3  . FUROSEMIDE 40 MG PO TABS Oral Take 40 mg by mouth daily as needed.     [name] LEVOCETIRIZINE DIHYDROCHLORIDE 5 MG PO TABS Oral Take 5 mg by mouth every evening.      [name] RABEPRAZOLE SODIUM 20 MG PO TBEC Oral Take 20 mg by mouth daily.      . TRAMADOL HCL 50 MG PO TABS Oral Take 50 mg by mouth every 8 (eight) hours as needed.  pain    . TRIAMCINOLONE ACETONIDE 55 MCG/ACT NA INHA Nasal Place 2 sprays into the nose daily.        BP 162/96  Pulse 79  Temp(Src) 97.6 F (36.4 C) (Oral)  Resp 20  SpO2 100%  Physical Exam  Nursing note and vitals reviewed. Constitutional: She is oriented to person, place, and time. She appears well-developed and well-nourished. No distress.  HENT:  Head: Normocephalic and atraumatic.  Eyes:       Normal appearance  Neck: Normal range of motion.  Cardiovascular: Normal rate and regular rhythm.   Pulmonary/Chest: Effort normal and breath sounds normal.  Musculoskeletal:       Left wrist and hand w/out deformity or skin changes.  Edema of thenar eminence and dorsal surface of hand.  Tenderness thenar eminence, distal radius and over carpal tunnel.  Pt is not able to completely extend any of fingers d/t pain and guards w/ passive extension of thumb.  2+ radial pulse and distal sensation intact.    Neurological: She is  alert and oriented to person, place, and time.  Skin: Skin is warm and dry. No rash noted.  Psychiatric: She has a normal mood and affect. Her behavior is normal.    ED Course  Procedures (including critical care time)  Labs Reviewed - No data to display Dg Hand Complete Left  11/13/2011  *RADIOLOGY REPORT*  Clinical Data: Posterior left hand pain and swelling  LEFT HAND - COMPLETE 3+ VIEW  Comparison: None.  Findings: Amorphous calcification/ossification is noted anterolateral to the distal radius.  No fracture or dislocation. Bones are subjectively mildly osteopenic.  No radiopaque foreign body.  IMPRESSION: Amorphous calcification/ossification anterolateral to the distal radius, which could be post-traumatic (remote) but is nonspecific. No acute abnormality.  Original Report Authenticated By: Harrel Lemon, M.D.     1. Tendonitis of wrist, left   2. Carpal tunnel syndrome, left       MDM  Pt w/ h/o left carpal tunnel presents for second time in the past week w/ c/o severe, non-traumatic L wrist pain.  Has not filled her prescription for percocet and cancelled her ortho appt in [place] tomorrow because wrist pain too severe.  No signs of joint infection on exam.  Xray unremarkable.  Pt likely has extensor tendonitis of thumb + carpal tunnel.  Recommended that she continue her naproxen, fill her prescription for percocet, ice 2-3x/d and see an orthopedist more locally.  Pt is happy with this plan. Return precautions discussed.      Otilio Miu, [place] 11/14/11 [phone]

## 2011-11-18 NOTE — ED Provider Notes (Signed)
Medical screening examination/treatment/procedure(s) were performed by non-physician practitioner and as supervising physician I was immediately available for consultation/collaboration.  Toy Baker, MD 11/18/11 1754

## 2011-12-08 ENCOUNTER — Telehealth: Payer: Self-pay | Admitting: Pulmonary Disease

## 2011-12-08 MED ORDER — AZITHROMYCIN 250 MG PO TABS: ORAL_TABLET | ORAL | Status: DC

## 2011-12-08 NOTE — Telephone Encounter (Signed)
Called and spoke with pt and she stated that she has had the ear ache, sinus infection, head congestion--pt is requesting that zpak be sent in to [facility] pharmacy at [facility].  She also wanted RA to know that her brother passed away and she is really worn down.  Please advise if ok to send in zpak.  Thanks  Allergies  Allergen Reactions  . Codeine     REACTION: vomiting  . Doxycycline     REACTION: itching and n\T\v  . Penicillins     REACTION: hives/welts  . Vicodin (Hydrocodone-Acetaminophen) Nausea And Vomiting

## 2011-12-08 NOTE — Telephone Encounter (Signed)
Ok to send z pak

## 2011-12-08 NOTE — Telephone Encounter (Signed)
Called and spoke with pt and she is aware of Zpak  That has been sent to the pharmacy

## 2011-12-09 ENCOUNTER — Encounter (HOSPITAL_COMMUNITY): Payer: Self-pay | Admitting: Emergency Medicine

## 2011-12-09 ENCOUNTER — Emergency Department (HOSPITAL_COMMUNITY)
Admission: EM | Admit: 2011-12-09 | Discharge: 2011-12-10 | Disposition: A | Discharge status: Home / Self Care | Payer: Self-pay | Attending: Emergency Medicine | Admitting: Emergency Medicine

## 2011-12-09 DIAGNOSIS — J45909 Unspecified asthma, uncomplicated: Secondary | ICD-10-CM | POA: Insufficient documentation

## 2011-12-09 DIAGNOSIS — H6693 Otitis media, unspecified, bilateral: Secondary | ICD-10-CM

## 2011-12-09 DIAGNOSIS — K219 Gastro-esophageal reflux disease without esophagitis: Secondary | ICD-10-CM | POA: Insufficient documentation

## 2011-12-09 DIAGNOSIS — J329 Chronic sinusitis, unspecified: Secondary | ICD-10-CM | POA: Insufficient documentation

## 2011-12-09 DIAGNOSIS — I10 Essential (primary) hypertension: Secondary | ICD-10-CM | POA: Insufficient documentation

## 2011-12-09 DIAGNOSIS — H6690 Otitis media, unspecified, unspecified ear: Secondary | ICD-10-CM | POA: Insufficient documentation

## 2011-12-09 DIAGNOSIS — Z79899 Other long term (current) drug therapy: Secondary | ICD-10-CM | POA: Insufficient documentation

## 2011-12-09 DIAGNOSIS — Z8739 Personal history of other diseases of the musculoskeletal system and connective tissue: Secondary | ICD-10-CM | POA: Insufficient documentation

## 2011-12-09 MED ORDER — ANTIPYRINE-BENZOCAINE 5.4-1.4 % OT SOLN
3.0000 [drp] | Freq: Once | OTIC | Status: AC
  Administered 2011-12-10: 4 [drp] via OTIC

## 2011-12-09 MED ORDER — AZITHROMYCIN 250 MG PO TABS
500.0000 mg | ORAL_TABLET | Freq: Once | ORAL | Status: AC
  Administered 2011-12-10: 500 mg via ORAL
  Filled 2011-12-09: qty 2

## 2011-12-09 NOTE — ED Provider Notes (Signed)
History     CSN: [id number]  Arrival date & time 12/09/11  2123   First MD Initiated Contact with Patient 12/09/11 2338      No chief complaint on file.   (Consider location/radiation/quality/duration/timing/severity/associated sxs/prior treatment) Patient is a 61 y.o. female presenting with URI. The history is provided by the patient.  URI The primary symptoms include fever, fatigue, headaches, ear pain, sore throat, cough and myalgias. Primary symptoms do not include nausea, vomiting or rash. The current episode started 3 to 5 days ago. This is a new problem.  The headache is not associated with neck stiffness or weakness.  The myalgias are not associated with weakness.  Symptoms associated with the illness include chills, facial pain, sinus pressure, congestion and rhinorrhea.  Pt reprts green mucus, bilateral ear pain. Fever at home. No medications tried. Deneis neck pain or stiffness. No nausea, vomiting, diarrhea.   Past Medical History  Diagnosis Date  . Thumb pain     right  . Arthritis     right anke/foot  . Allergic rhinitis   . Asthma   . HTN (hypertension)   . GERD (gastroesophageal reflux disease)   . Tendinitis   . Knee pain     Past Surgical History  Procedure Date  . Uterine fibroid surgery 1988    Dr Randell Patient  . Repeat fibroid excision 1997    Dr Randell Patient  . Arthroscopic left knee surgery 1992  . Right heel surgery on plantar facitis 1995    Family History  Problem Relation Age of Onset  . Uterine cancer Mother   . Hypertension Mother   . Hypertension Father     History  Substance Use Topics  . Smoking status: Never Smoker   . Smokeless tobacco: Never Used  . Alcohol Use: No    OB History    Grav Para Term Preterm Abortions TAB SAB Ect Mult Living                  Review of Systems  Constitutional: Positive for fever, chills and fatigue.  HENT: Positive for ear pain, congestion, sore throat, rhinorrhea and sinus pressure. Negative for neck  stiffness and ear discharge.   Eyes: Positive for redness.  Respiratory: Positive for cough.   Gastrointestinal: Negative for nausea and vomiting.  Musculoskeletal: Positive for myalgias.  Skin: Negative for rash.  Neurological: Positive for dizziness and headaches. Negative for weakness.    Allergies  Codeine; Doxycycline; Penicillins; and Vicodin  Home Medications   Current Outpatient Rx  Name Route Sig Dispense Refill  . ALBUTEROL SULFATE HFA 108 (90 BASE) MCG/ACT IN AERS Inhalation Inhale 2 puffs into the lungs every 4 (four) hours as needed. Shortness of breath    . AMLODIPINE BESYLATE 5 MG PO TABS Oral Take 5 mg by mouth daily.    . CELECOXIB 200 MG PO CAPS Oral Take 200 mg by mouth daily.    [name] CLONIDINE HCL 0.2 MG PO TABS Oral Take 1 tablet (0.2 mg total) by mouth 2 (two) times daily. 60 tablet 1  . FLUTICASONE-SALMETEROL 100-50 MCG/DOSE IN AEPB Inhalation Inhale 1 puff into the lungs every 12 (twelve) hours. 60 each 3  . FUROSEMIDE 40 MG PO TABS Oral Take 40 mg by mouth 2 (two) times daily.     [name] LEVOCETIRIZINE DIHYDROCHLORIDE 5 MG PO TABS Oral Take 5 mg by mouth every evening.      [name] RABEPRAZOLE SODIUM 20 MG PO TBEC Oral Take 20 mg by mouth  daily.      . TRIAMCINOLONE ACETONIDE 55 MCG/ACT NA INHA Nasal Place 2 sprays into the nose daily.        BP 189/87  Pulse 103  Temp(Src) 99 F (37.2 C) (Oral)  Resp 18  SpO2 97%  LMP 11/07/2011  Physical Exam  Nursing note and vitals reviewed. Constitutional: She is oriented to person, place, and time. She appears well-developed and well-nourished. No distress.  HENT:  Head: Normocephalic.  Right Ear: Tympanic membrane is injected and bulging. A middle ear effusion is present.  Left Ear: Tympanic membrane is injected and bulging. A middle ear effusion is present.  Nose: Mucosal edema and rhinorrhea present.  Mouth/Throat: Uvula is midline, oropharynx is clear and moist and mucous membranes are normal. No oropharyngeal exudate,  posterior oropharyngeal edema or posterior oropharyngeal erythema.       Green purulent nasal drainage  Eyes: Conjunctivae are normal.  Neck: Neck supple.  Cardiovascular: Normal rate, regular rhythm and normal heart sounds.   Pulmonary/Chest: Effort normal and breath sounds normal. No respiratory distress. She has no wheezes. She has no rales.  Abdominal: Soft. Bowel sounds are normal. There is no tenderness.  Neurological: She is alert and oriented to person, place, and time.  Skin: Skin is warm and dry.  Psychiatric: She has a normal mood and affect.    ED Course  Procedures (including critical care time)  Pt with sinunsitis, bilateral otitis media. VS who hypertension, Tachy at 103. She does not apparea to be toxic. Will place on z-pack, decongestants.   1. Otitis media of both ears   2. Sinusitis       MDM          Lottie Mussel, PA 12/10/11 [phone]

## 2011-12-09 NOTE — ED Notes (Signed)
Pt states she is having green mucous in her nose and throat  Pt states c/o bilateral ear pain and facial pain  Pt states she feels like she has a fever and a headache, sore throat and has been hoarse for three days

## 2011-12-10 MED ORDER — PSEUDOEPHEDRINE HCL 30 MG PO TABS: 30.0000 mg | ORAL_TABLET | ORAL | Status: AC | PRN

## 2011-12-10 MED ORDER — AZITHROMYCIN 250 MG PO TABS: ORAL_TABLET | ORAL | Status: DC

## 2011-12-10 NOTE — Discharge Instructions (Signed)
Both of your ears are infected, and you have a sinus infection. Continue zithromax as prescribed for infection. Ibuprofen for pain and fever. Sudafed for congestion. Follow up with your doctor for recheck in 2-3 days. Return if worsening.   Otitis Media, Adult A middle ear infection is an infection in the space behind the eardrum. The medical name for this is "otitis media." It may happen after a common cold. It is caused by a germ that starts growing in that space. You may feel swollen glands in your neck on the side of the ear infection. HOME CARE INSTRUCTIONS   Take your medicine as directed until it is gone, even if you feel better after the first few days.   Only take over-the-counter or prescription medicines for pain, discomfort, or fever as directed by your caregiver.   Occasional use of a nasal decongestant a couple times per day may help with discomfort and help the eustachian tube to drain better.  Follow up with your caregiver in 10 to 14 days or as directed, to be certain that the infection has cleared. Not keeping the appointment could result in a chronic or permanent injury, pain, hearing loss and disability. If there is any problem keeping the appointment, you must call back to this facility for assistance. SEEK IMMEDIATE MEDICAL CARE IF:   You are not getting better in 2 to 3 days.   You have pain that is not controlled with medication.   You feel worse instead of better.   You cannot use the medication as directed.   You develop swelling, redness or pain around the ear or stiffness in your neck.  MAKE SURE YOU:   Understand these instructions.   Will watch your condition.   Will get help right away if you are not doing well or get worse.  Document Released: 04/15/2004 Document Revised: 06/30/2011 Document Reviewed: 02/15/2008 [facility] Patient Information 2012 [place], [place]. Sinusitis Sinuses are air pockets within the bones of your face. The growth of bacteria  within a sinus leads to infection. The infection prevents the sinuses from draining. This infection is called sinusitis. SYMPTOMS  There will be different areas of pain depending on which sinuses have become infected.  The maxillary sinuses often produce pain beneath the eyes.   Frontal sinusitis may cause pain in the middle of the forehead and above the eyes.  Other problems (symptoms) include:  Toothaches.   Colored, pus-like (purulent) drainage from the nose.   Swelling, warmth, and tenderness over the sinus areas may be signs of infection.  TREATMENT  Sinusitis is most often determined by an exam.X-rays may be taken. If x-rays have been taken, make sure you obtain your results or find out how you are to obtain them. Your caregiver may give you medications (antibiotics). These are medications that will help kill the bacteria causing the infection. You may also be given a medication (decongestant) that helps to reduce sinus swelling.  HOME CARE INSTRUCTIONS   Only take over-the-counter or prescription medicines for pain, discomfort, or fever as directed by your caregiver.   Drink extra fluids. Fluids help thin the mucus so your sinuses can drain more easily.   Applying either moist heat or ice packs to the sinus areas may help relieve discomfort.   Use saline nasal sprays to help moisten your sinuses. The sprays can be found at your local drugstore.  SEEK IMMEDIATE MEDICAL CARE IF:  You have a fever.   You have increasing pain, severe headaches,  or toothache.   You have nausea, vomiting, or drowsiness.   You develop unusual swelling around the face or trouble seeing.  MAKE SURE YOU:   Understand these instructions.   Will watch your condition.   Will get help right away if you are not doing well or get worse.  Document Released: 07/11/2005 Document Revised: 06/30/2011 Document Reviewed: 02/07/2007 [facility] Patient Information 2012 [place], [place].

## 2011-12-12 NOTE — ED Provider Notes (Signed)
Medical screening examination/treatment/procedure(s) were performed by non-physician practitioner and as supervising physician I was immediately available for consultation/collaboration.   Suzi Roots, MD 12/12/11 [phone]

## 2012-01-13 ENCOUNTER — Telehealth: Payer: Self-pay | Admitting: Pulmonary Disease

## 2012-01-13 NOTE — Telephone Encounter (Signed)
Called and spoke with pt and she is aware of samples of the advair that have been left up front for her to pick up.  She is aware that we have no samples of the ventolin.  Nothing further needed.

## 2012-01-20 ENCOUNTER — Ambulatory Visit (INDEPENDENT_AMBULATORY_CARE_PROVIDER_SITE_OTHER): Payer: Self-pay | Admitting: Pulmonary Disease

## 2012-01-20 ENCOUNTER — Encounter: Payer: Self-pay | Admitting: Pulmonary Disease

## 2012-01-20 VITALS — BP 160/96 | HR 101 | Temp 98.8°F | Ht 61.0 in | Wt 231.0 lb

## 2012-01-20 DIAGNOSIS — R609 Edema, unspecified: Secondary | ICD-10-CM

## 2012-01-20 DIAGNOSIS — J45909 Unspecified asthma, uncomplicated: Secondary | ICD-10-CM

## 2012-01-20 NOTE — Progress Notes (Signed)
  Subjective:    Patient ID: Mariah [name], female    DOB: 09/03/1953, 61 y.o.   MRN: [id number]  HPI 61 year old [facility] patient with persistent Asthma well controlled on advair - when compliant.  02/19/08-- Presents for for 2 week of cough, congestion- yellow thick mucus and wheezing. Ran out of advair. 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  ? compliance with advair due to social reasons   Review of Systems Patient denies significant hemoptysis,  chest pain, palpitations, orthopnea, paroxysmal nocturnal dyspnea, lightheadedness, nausea, vomiting, abdominal or  leg pains      Objective:   Physical Exam Gen. Pleasant, well-nourished, in no distress ENT - no lesions, no post nasal drip Neck: No JVD, no thyromegaly, no carotid bruits Lungs: no use of accessory muscles, no dullness to percussion, clear without rales or rhonchi  Cardiovascular: Rhythm regular, heart sounds  normal, no murmurs or gallops, 2+ peripheral edema Musculoskeletal: No deformities, no cyanosis or clubbing         Assessment & Plan:

## 2012-01-20 NOTE — Patient Instructions (Signed)
Get back on lasix  Stay on advair

## 2012-01-21 NOTE — Assessment & Plan Note (Signed)
Mild persistent advair does control her symptoms adequately if she is compliant Prn SABA Her problems are mainly social - seems to use ED for her acute issues

## 2012-01-21 NOTE — Assessment & Plan Note (Signed)
She will get back on lasix

## 2012-01-23 ENCOUNTER — Telehealth: Payer: Self-pay | Admitting: Pulmonary Disease

## 2012-01-23 NOTE — Telephone Encounter (Signed)
Message copied by Oretha Milch on Mon Jan 23, 2012 11:28 AM ------      Message from: Oretha Milch      Created: Sat Jan 21, 2012  7:19 AM       [facility]

## 2012-02-07 ENCOUNTER — Telehealth: Payer: Self-pay | Admitting: Adult Health

## 2012-02-07 NOTE — Telephone Encounter (Signed)
Spoke with patient-she states she was seen by TP on 05-27-11 and states that patient was not having any back pain, joint swelling or pain, or stress and depression-I explained to patient that those comments were pertaining to that days visit only. Pt states she is due to court in am for this. Pt aware that TP is done for the day and will not be back until Thursday.    However, TP answered this message by stating to let pt know that she apologizes but the statement above was pertaining to that days visit and she was seen for acute visit of respiratory issues.

## 2012-02-08 NOTE — Telephone Encounter (Signed)
Called spoke with patient who stated she went to her hearing and was told that the information in the ov note that she was concerned about is not an issue.  Will be bringing by disability paperwork to [employer] at her convenience.  Nothing further needed at this time.  Will sign off.

## 2012-03-07 ENCOUNTER — Emergency Department (HOSPITAL_COMMUNITY)
Admission: EM | Admit: 2012-03-07 | Discharge: 2012-03-07 | Disposition: A | Discharge status: Home / Self Care | Payer: Self-pay | Attending: Emergency Medicine | Admitting: Emergency Medicine

## 2012-03-07 ENCOUNTER — Encounter (HOSPITAL_COMMUNITY): Payer: Self-pay | Admitting: Emergency Medicine

## 2012-03-07 DIAGNOSIS — M79609 Pain in unspecified limb: Secondary | ICD-10-CM | POA: Insufficient documentation

## 2012-03-07 DIAGNOSIS — Z8049 Family history of malignant neoplasm of other genital organs: Secondary | ICD-10-CM | POA: Insufficient documentation

## 2012-03-07 DIAGNOSIS — J45909 Unspecified asthma, uncomplicated: Secondary | ICD-10-CM | POA: Insufficient documentation

## 2012-03-07 DIAGNOSIS — I10 Essential (primary) hypertension: Secondary | ICD-10-CM | POA: Insufficient documentation

## 2012-03-07 DIAGNOSIS — K219 Gastro-esophageal reflux disease without esophagitis: Secondary | ICD-10-CM | POA: Insufficient documentation

## 2012-03-07 DIAGNOSIS — Z880 Allergy status to penicillin: Secondary | ICD-10-CM | POA: Insufficient documentation

## 2012-03-07 DIAGNOSIS — Z885 Allergy status to narcotic agent status: Secondary | ICD-10-CM | POA: Insufficient documentation

## 2012-03-07 DIAGNOSIS — J309 Allergic rhinitis, unspecified: Secondary | ICD-10-CM | POA: Insufficient documentation

## 2012-03-07 DIAGNOSIS — Z760 Encounter for issue of repeat prescription: Secondary | ICD-10-CM | POA: Insufficient documentation

## 2012-03-07 DIAGNOSIS — Z881 Allergy status to other antibiotic agents status: Secondary | ICD-10-CM | POA: Insufficient documentation

## 2012-03-07 DIAGNOSIS — Z8249 Family history of ischemic heart disease and other diseases of the circulatory system: Secondary | ICD-10-CM | POA: Insufficient documentation

## 2012-03-07 DIAGNOSIS — M25569 Pain in unspecified knee: Secondary | ICD-10-CM | POA: Insufficient documentation

## 2012-03-07 DIAGNOSIS — Z0000 Encounter for general adult medical examination without abnormal findings: Secondary | ICD-10-CM | POA: Insufficient documentation

## 2012-03-07 DIAGNOSIS — M129 Arthropathy, unspecified: Secondary | ICD-10-CM | POA: Insufficient documentation

## 2012-03-07 DIAGNOSIS — M779 Enthesopathy, unspecified: Secondary | ICD-10-CM | POA: Insufficient documentation

## 2012-03-07 MED ORDER — FLUTICASONE-SALMETEROL 100-50 MCG/DOSE IN AEPB: 1.0000 | INHALATION_SPRAY | Freq: Two times a day (BID) | RESPIRATORY_TRACT | Status: DC

## 2012-03-07 MED ORDER — CELECOXIB 100 MG PO CAPS: 200.0000 mg | ORAL_CAPSULE | Freq: Every day | ORAL | Status: AC

## 2012-03-07 MED ORDER — RABEPRAZOLE SODIUM 20 MG PO TBEC: 20.0000 mg | DELAYED_RELEASE_TABLET | Freq: Every day | ORAL | Status: DC

## 2012-03-07 MED ORDER — ALBUTEROL SULFATE HFA 108 (90 BASE) MCG/ACT IN AERS: 2.0000 | INHALATION_SPRAY | RESPIRATORY_TRACT | Status: DC | PRN

## 2012-03-07 MED ORDER — LEVOCETIRIZINE DIHYDROCHLORIDE 5 MG PO TABS: 5.0000 mg | ORAL_TABLET | Freq: Every evening | ORAL | Status: DC

## 2012-03-07 MED ORDER — AMLODIPINE BESYLATE 10 MG PO TABS: 5.0000 mg | ORAL_TABLET | Freq: Every day | ORAL | Status: DC

## 2012-03-07 MED ORDER — CLONIDINE HCL 0.1 MG PO TABS: 0.2000 mg | ORAL_TABLET | Freq: Two times a day (BID) | ORAL | Status: DC

## 2012-03-07 MED ORDER — TRIAMCINOLONE ACETONIDE(NASAL) 55 MCG/ACT NA INHA: 2.0000 | Freq: Every day | NASAL | Status: DC

## 2012-03-07 MED ORDER — FUROSEMIDE 40 MG PO TABS: 40.0000 mg | ORAL_TABLET | Freq: Two times a day (BID) | ORAL | Status: DC

## 2012-03-07 MED ORDER — LORATADINE 10 MG PO TABS: 10.0000 mg | ORAL_TABLET | Freq: Every day | ORAL | Status: DC

## 2012-03-07 NOTE — ED Notes (Signed)
Pt presenting to ed with c/o medication refill. [facility] and she hasn't been able to get her medications. Pt states she has been out of her medications x 8 days.

## 2012-03-07 NOTE — ED Provider Notes (Signed)
Medical screening examination/treatment/procedure(s) were performed by non-physician practitioner and as supervising physician I was immediately available for consultation/collaboration.  Toy Baker, MD 03/07/12 [phone]

## 2012-03-07 NOTE — ED Notes (Signed)
Pt states that she did not need the prescriptions she needed the actual medications filled for her. Pt informed that we could not do that in the ED, but was offered consult with care management, which she refused. Joni Reining, PA notified. Pt stated she did not have time to wait for a case manager and had to leave. NAD noted at time of discharge.

## 2012-03-07 NOTE — ED Provider Notes (Signed)
History     CSN: [id number]  Arrival date & time 03/07/12  1701   None     Chief Complaint  Patient presents with  . Medication Refill    (Consider location/radiation/quality/duration/timing/severity/associated sxs/prior treatment) HPI  61 y.o. female in no acute distress presenting for medication refill. She denies any headache, nausea, vomiting, chest pain, palpitations, weakness change in vision or any other symptoms. She has been without her medications for 7 days. [facility]   Past Medical History  Diagnosis Date  . Thumb pain     right  . Arthritis     right anke/foot  . Allergic rhinitis   . Asthma   . HTN (hypertension)   . GERD (gastroesophageal reflux disease)   . Tendinitis   . Knee pain     Past Surgical History  Procedure Date  . Uterine fibroid surgery 1988    Dr Randell Patient  . Repeat fibroid excision 1997    Dr Randell Patient  . Arthroscopic left knee surgery 1992  . Right heel surgery on plantar facitis 1995    Family History  Problem Relation Age of Onset  . Uterine cancer Mother   . Hypertension Mother   . Hypertension Father     History  Substance Use Topics  . Smoking status: Never Smoker   . Smokeless tobacco: Never Used  . Alcohol Use: No    OB History    Grav Para Term Preterm Abortions TAB SAB Ect Mult Living                  Review of Systems  All other systems reviewed and are negative.    Allergies  Codeine; Doxycycline; Penicillins; and Vicodin  Home Medications   Current Outpatient Rx  Name Route Sig Dispense Refill  . ALBUTEROL SULFATE HFA 108 (90 BASE) MCG/ACT IN AERS Inhalation Inhale 2 puffs into the lungs every 4 (four) hours as needed. Shortness of breath    . AMLODIPINE BESYLATE 5 MG PO TABS Oral Take 5 mg by mouth daily.    . CELECOXIB 200 MG PO CAPS Oral Take 200 mg by mouth daily.    [name] CLONIDINE HCL 0.2 MG PO TABS Oral Take 1 tablet (0.2 mg total) by mouth 2 (two)  times daily. 60 tablet 1  . FLUTICASONE-SALMETEROL 100-50 MCG/DOSE IN AEPB Inhalation Inhale 1 puff into the lungs every 12 (twelve) hours. 60 each 3  . FUROSEMIDE 40 MG PO TABS Oral Take 40 mg by mouth 2 (two) times daily.     [name] LEVOCETIRIZINE DIHYDROCHLORIDE 5 MG PO TABS Oral Take 5 mg by mouth every evening.     [name] LORATADINE 10 MG PO TABS Oral Take 10 mg by mouth daily.    [name] RABEPRAZOLE SODIUM 20 MG PO TBEC Oral Take 20 mg by mouth daily.      . TRIAMCINOLONE ACETONIDE 55 MCG/ACT NA INHA Nasal Place 2 sprays into the nose daily.      . ALBUTEROL SULFATE HFA 108 (90 BASE) MCG/ACT IN AERS Inhalation Inhale 2 puffs into the lungs every 4 (four) hours as needed for wheezing. 1 Inhaler 3  . AMLODIPINE BESYLATE 10 MG PO TABS Oral Take 0.5 tablets (5 mg total) by mouth daily. 30 tablet 1  . CELECOXIB 100 MG PO CAPS Oral Take 2 capsules (200 mg total) by mouth daily. 30 capsule 1  . CLONIDINE HCL 0.1 MG PO TABS Oral Take 2 tablets (  0.2 mg total) by mouth 2 (two) times daily. 30 tablet 1  . FLUTICASONE-SALMETEROL 100-50 MCG/DOSE IN AEPB Inhalation Inhale 1 puff into the lungs 2 (two) times daily. 60 each 1  . FUROSEMIDE 40 MG PO TABS Oral Take 1 tablet (40 mg total) by mouth 2 (two) times daily. 30 tablet 1  . LEVOCETIRIZINE DIHYDROCHLORIDE 5 MG PO TABS Oral Take 1 tablet (5 mg total) by mouth every evening. 30 tablet 1  . LORATADINE 10 MG PO TABS Oral Take 1 tablet (10 mg total) by mouth daily. 30 tablet 1  . RABEPRAZOLE SODIUM 20 MG PO TBEC Oral Take 1 tablet (20 mg total) by mouth daily. 30 tablet 1  . TRIAMCINOLONE ACETONIDE 55 MCG/ACT NA INHA Nasal Place 2 sprays into the nose daily. 1 Inhaler 12    BP 172/90  Pulse 98  Temp 98.7 F (37.1 C) (Oral)  Resp 18  SpO2 98%  LMP 01/31/2012  Physical Exam  Nursing note and vitals reviewed. Constitutional: She is oriented to person, place, and time. She appears well-developed and well-nourished. No distress.  HENT:  Head: Normocephalic and  atraumatic.  Eyes: Conjunctivae and EOM are normal. Pupils are equal, round, and reactive to light.  Neck: Normal range of motion. Neck supple. No JVD present.  Cardiovascular: Normal rate, regular rhythm, normal heart sounds and intact distal pulses.  Exam reveals no gallop and no friction rub.   No murmur heard. Pulmonary/Chest: Effort normal and breath sounds normal. No stridor. No respiratory distress. She has no wheezes. She has no rales. She exhibits no tenderness.  Abdominal: Soft. Bowel sounds are normal.  Musculoskeletal: Normal range of motion.  Neurological: She is alert and oriented to person, place, and time.  Psychiatric: She has a normal mood and affect.    ED Course  Procedures (including critical care time)  Labs Reviewed - No data to display No results found.   1. Regular check-up       MDM  Patient presenting for medication refill. She has been without her medications for 7 days. She is asymptomatic and physical is normal. [facility] was [facility] patient and has yet to establish alternative medical care.       Wynetta Emery, PA-C 03/07/12 [phone]

## 2012-03-07 NOTE — ED Notes (Signed)
Nicole, PA at bedside 

## 2012-04-13 ENCOUNTER — Emergency Department (HOSPITAL_COMMUNITY)
Admission: EM | Admit: 2012-04-13 | Discharge: 2012-04-13 | Disposition: A | Discharge status: Home / Self Care | Payer: Self-pay | Attending: Emergency Medicine | Admitting: Emergency Medicine

## 2012-04-13 ENCOUNTER — Encounter (HOSPITAL_COMMUNITY): Payer: Self-pay | Admitting: Family Medicine

## 2012-04-13 DIAGNOSIS — G5600 Carpal tunnel syndrome, unspecified upper limb: Secondary | ICD-10-CM | POA: Insufficient documentation

## 2012-04-13 DIAGNOSIS — K219 Gastro-esophageal reflux disease without esophagitis: Secondary | ICD-10-CM | POA: Insufficient documentation

## 2012-04-13 DIAGNOSIS — J45909 Unspecified asthma, uncomplicated: Secondary | ICD-10-CM | POA: Insufficient documentation

## 2012-04-13 DIAGNOSIS — B372 Candidiasis of skin and nail: Secondary | ICD-10-CM

## 2012-04-13 DIAGNOSIS — I10 Essential (primary) hypertension: Secondary | ICD-10-CM | POA: Insufficient documentation

## 2012-04-13 DIAGNOSIS — B3789 Other sites of candidiasis: Secondary | ICD-10-CM | POA: Insufficient documentation

## 2012-04-13 HISTORY — DX: Carpal tunnel syndrome, unspecified upper limb: G56.00

## 2012-04-13 HISTORY — DX: Bursopathy, unspecified: M71.9

## 2012-04-13 MED ORDER — NYSTATIN 100000 UNIT/GM EX CREA: TOPICAL_CREAM | CUTANEOUS | Status: DC

## 2012-04-13 MED ORDER — CLONIDINE HCL 0.1 MG PO TABS: 0.2000 mg | ORAL_TABLET | Freq: Two times a day (BID) | ORAL | Status: DC

## 2012-04-13 MED ORDER — ALBUTEROL SULFATE HFA 108 (90 BASE) MCG/ACT IN AERS
2.0000 | INHALATION_SPRAY | Freq: Once | RESPIRATORY_TRACT | Status: AC
  Administered 2012-04-13: 2 via RESPIRATORY_TRACT
  Filled 2012-04-13: qty 6.7

## 2012-04-13 MED ORDER — ALBUTEROL SULFATE HFA 108 (90 BASE) MCG/ACT IN AERS: 1.0000 | INHALATION_SPRAY | Freq: Four times a day (QID) | RESPIRATORY_TRACT | Status: DC | PRN

## 2012-04-13 MED ORDER — AMLODIPINE BESYLATE 10 MG PO TABS: 5.0000 mg | ORAL_TABLET | Freq: Every day | ORAL | Status: DC

## 2012-04-13 NOTE — ED Notes (Signed)
Patient states that she has had a rash underneath her breasts x 2 weeks. States that she has put [employer] powder on it without relief.

## 2012-04-13 NOTE — ED Provider Notes (Signed)
History     CSN: [id number]  Arrival date & time 04/13/12  6578   First MD Initiated Contact with Patient 04/13/12 2153      Chief Complaint  Patient presents with  . Rash    (Consider location/radiation/quality/duration/timing/severity/associated sxs/prior treatment) HPI Comments: Mariah [name] 61 y.o. female   The chief complaint is: Patient presents with:   Rash   The patient has medical history significant for:   Past Medical History:   Thumb pain                                                     Comment:right   Arthritis                                                      Comment:right anke/foot   Allergic rhinitis                                            Asthma                                                       HTN (hypertension)                                           GERD (gastroesophageal reflux disease)                       Tendinitis                                                   Knee pain                                                    Bursitis                                                     Carpal tunnel syndrome                                      Patient presents with a rash under her breasts that has been present for two weeks. She attributes it to sweating under her breasts. She has tried gold bond powder, diaper rash ointment, and anti-itch cream with  some relief. Patient states she has never had a rash like this before. Of note patient was a former [facility] patient and also wants refills for her anti-hypertensives and asthma medication. Denies fever or chills. Reports cough and SOB from her asthma. Denies NVD, or abdominal pain.      The history is provided by the patient.    Past Medical History  Diagnosis Date  . Thumb pain     right  . Arthritis     right anke/foot  . Allergic rhinitis   . Asthma   . HTN (hypertension)   . GERD (gastroesophageal reflux disease)   . Tendinitis   . Knee pain   . Bursitis   .  Carpal tunnel syndrome     Past Surgical History  Procedure Date  . Uterine fibroid surgery 1988    Dr Randell Patient  . Repeat fibroid excision 1997    Dr Randell Patient  . Arthroscopic left knee surgery 1992  . Right heel surgery on plantar facitis 1995  . Wrist surgery     Family History  Problem Relation Age of Onset  . Uterine cancer Mother   . Hypertension Mother   . Hypertension Father     History  Substance Use Topics  . Smoking status: Never Smoker   . Smokeless tobacco: Never Used  . Alcohol Use: No    OB History    Grav Para Term Preterm Abortions TAB SAB Ect Mult Living                  Review of Systems  Constitutional: Negative for fever and chills.  Respiratory: Positive for cough and shortness of breath.   Gastrointestinal: Negative for nausea, vomiting, abdominal pain and diarrhea.  Skin: Positive for rash.  All other systems reviewed and are negative.    Allergies  Codeine; Doxycycline; Penicillins; and Vicodin  Home Medications   Current Outpatient Rx  Name Route Sig Dispense Refill  . ALBUTEROL SULFATE HFA 108 (90 BASE) MCG/ACT IN AERS Inhalation Inhale 2 puffs into the lungs every 4 (four) hours as needed. Shortness of breath    . AMLODIPINE BESYLATE 5 MG PO TABS Oral Take 5 mg by mouth daily.    . CELECOXIB 200 MG PO CAPS Oral Take 200 mg by mouth daily.    [name] CLONIDINE HCL 0.2 MG PO TABS Oral Take 1 tablet (0.2 mg total) by mouth 2 (two) times daily. 60 tablet 1  . FLUTICASONE-SALMETEROL 100-50 MCG/DOSE IN AEPB Inhalation Inhale 1 puff into the lungs every 12 (twelve) hours. 60 each 3  . FUROSEMIDE 40 MG PO TABS Oral Take 40 mg by mouth 2 (two) times daily.     [name] LORATADINE 10 MG PO TABS Oral Take 10 mg by mouth daily.    . MOMETASONE FUROATE 50 MCG/ACT NA SUSP Nasal Place 2 sprays into the nose daily.    [name] OMEPRAZOLE 10 MG PO CPDR Oral Take 10 mg by mouth daily.    [name] RABEPRAZOLE SODIUM 20 MG PO TBEC Oral Take 20 mg by mouth daily.     . TRIAMCINOLONE  ACETONIDE 55 MCG/ACT NA INHA Nasal Place 2 sprays into the nose daily.        BP 171/88  Pulse 80  Temp 98.2 F (36.8 C) (Oral)  Resp 21  SpO2 100%  LMP 09/14/2011  Physical Exam  Nursing note and vitals reviewed. Constitutional: She appears well-developed and well-nourished. No distress.  HENT:  Head: Normocephalic and atraumatic.  Mouth/Throat:  Oropharynx is clear and moist.  Eyes: Conjunctivae normal and EOM are normal. No scleral icterus.  Neck: Normal range of motion. Neck supple.  Cardiovascular: Normal rate, regular rhythm and normal heart sounds.   Pulmonary/Chest: Breath sounds normal. She has no wheezes.       Patient has some increased effort with breathing. She states that she had to use her inhaler on the way to this visit.  Abdominal: Soft. Bowel sounds are normal. There is no tenderness.  Lymphadenopathy:    She has no cervical adenopathy.  Neurological: She is alert.  Skin: Skin is warm and dry. Rash noted.       There is a waxy, erythematous, maculopapular rash consistent with candida infection.    ED Course  Procedures (including critical care time)  Labs Reviewed - No data to display No results found.   1. Cutaneous candidiasis       MDM  Patient presented with rash and request for medication refill. Patient discharged on nystatin with return precautions. As per recommendation for [facility] patient she was given an inhaler to go, in addition to 3 months supply for albuterol, clonidine, and amlodipine. No red flags for SJS or TENS.        Pixie Casino, PA-C 04/13/12 2325

## 2012-04-13 NOTE — ED Provider Notes (Signed)
Medical screening examination/treatment/procedure(s) were performed by non-physician practitioner and as supervising physician I was immediately available for consultation/collaboration.  Donnetta Hutching, MD 04/13/12 2328

## 2012-04-13 NOTE — ED Notes (Signed)
Pt has white rash under breasts. Pt states rash becomes itchy and worse when it's hot outside and pt perspires. Pt has been using various over the counter powders and creams which help only temporarily.

## 2012-04-22 ENCOUNTER — Emergency Department (HOSPITAL_COMMUNITY)
Admission: EM | Admit: 2012-04-22 | Discharge: 2012-04-22 | Disposition: A | Discharge status: Home / Self Care | Payer: Self-pay | Attending: Emergency Medicine | Admitting: Emergency Medicine

## 2012-04-22 ENCOUNTER — Encounter (HOSPITAL_COMMUNITY): Payer: Self-pay | Admitting: Emergency Medicine

## 2012-04-22 DIAGNOSIS — M19079 Primary osteoarthritis, unspecified ankle and foot: Secondary | ICD-10-CM | POA: Insufficient documentation

## 2012-04-22 DIAGNOSIS — Z880 Allergy status to penicillin: Secondary | ICD-10-CM | POA: Insufficient documentation

## 2012-04-22 DIAGNOSIS — J45909 Unspecified asthma, uncomplicated: Secondary | ICD-10-CM | POA: Insufficient documentation

## 2012-04-22 DIAGNOSIS — Z760 Encounter for issue of repeat prescription: Secondary | ICD-10-CM | POA: Insufficient documentation

## 2012-04-22 DIAGNOSIS — K219 Gastro-esophageal reflux disease without esophagitis: Secondary | ICD-10-CM | POA: Insufficient documentation

## 2012-04-22 DIAGNOSIS — I10 Essential (primary) hypertension: Secondary | ICD-10-CM | POA: Insufficient documentation

## 2012-04-22 MED ORDER — CLONIDINE HCL 0.2 MG PO TABS: 0.2000 mg | ORAL_TABLET | Freq: Two times a day (BID) | ORAL | Status: DC

## 2012-04-22 NOTE — ED Provider Notes (Signed)
History     CSN: [id number]  Arrival date & time 04/22/12  1729   First MD Initiated Contact with Patient 04/22/12 1954      Chief Complaint  Patient presents with  . Medication Refill    (Consider location/radiation/quality/duration/timing/severity/associated sxs/prior treatment) HPI Comments: 61 year old female presents to the emergency department to discuss her clonidine prescription that was written when she was seen here last week. She takes 0.2 mg of clonidine twice a day for her blood pressure. Last week her prescription was written as 0.1 mg take 2 pills twice a day. She is concerned that she'll run out quit. She is convinced that this is the wrong prescription and she will is insisting she gets the 0.2 mg written. She states that she will have to call and complain about the PA who saw her last week and wrote the wrong dose if this prescription has not changed. She states she knows "1+1=2, and 2+2=4, but the prescription will run out quicker". She does not understand why someone would write her prescription like that. She states she has too much going on that she cannot keep refilling her medication. Denies any complaints at this time.  The history is provided by the patient.    Past Medical History  Diagnosis Date  . Thumb pain     right  . Arthritis     right anke/foot  . Allergic rhinitis   . Asthma   . HTN (hypertension)   . GERD (gastroesophageal reflux disease)   . Tendinitis   . Knee pain   . Bursitis   . Carpal tunnel syndrome     Past Surgical History  Procedure Date  . Uterine fibroid surgery 1988    Dr Randell Patient  . Repeat fibroid excision 1997    Dr Randell Patient  . Arthroscopic left knee surgery 1992  . Right heel surgery on plantar facitis 1995  . Wrist surgery     Family History  Problem Relation Age of Onset  . Uterine cancer Mother   . Hypertension Mother   . Hypertension Father     History  Substance Use Topics  . Smoking status: Never Smoker   .  Smokeless tobacco: Never Used  . Alcohol Use: No    OB History    Grav Para Term Preterm Abortions TAB SAB Ect Mult Living                  Review of Systems  Constitutional: Negative for fever and chills.  Respiratory: Negative for shortness of breath.   Cardiovascular: Negative for chest pain.  Neurological: Negative for headaches.    Allergies  Codeine; Doxycycline; Penicillins; and Vicodin  Home Medications   Current Outpatient Rx  Name Route Sig Dispense Refill  . CLONIDINE HCL 0.1 MG PO TABS Oral Take 2 tablets (0.2 mg total) by mouth 2 (two) times daily. 30 tablet 3  . ALBUTEROL SULFATE HFA 108 (90 BASE) MCG/ACT IN AERS Inhalation Inhale 1-2 puffs into the lungs every 6 (six) hours as needed for wheezing. 1 Inhaler 3  . ALBUTEROL SULFATE HFA 108 (90 BASE) MCG/ACT IN AERS Inhalation Inhale 2 puffs into the lungs every 4 (four) hours as needed. Shortness of breath    . AMLODIPINE BESYLATE 10 MG PO TABS Oral Take 0.5 tablets (5 mg total) by mouth daily. 30 tablet 3  . AMLODIPINE BESYLATE 5 MG PO TABS Oral Take 5 mg by mouth daily.    . CELECOXIB 200 MG PO CAPS Oral  Take 200 mg by mouth daily.    [name] FLUTICASONE-SALMETEROL 100-50 MCG/DOSE IN AEPB Inhalation Inhale 1 puff into the lungs every 12 (twelve) hours. 60 each 3  . FUROSEMIDE 40 MG PO TABS Oral Take 40 mg by mouth 2 (two) times daily.     [name] LORATADINE 10 MG PO TABS Oral Take 10 mg by mouth daily.    . MOMETASONE FUROATE 50 MCG/ACT NA SUSP Nasal Place 2 sprays into the nose daily.    . NYSTATIN 100000 UNIT/GM EX CREA  Apply to affected area 2 times daily 30 g 1  . OMEPRAZOLE 10 MG PO CPDR Oral Take 10 mg by mouth daily.    [name] RABEPRAZOLE SODIUM 20 MG PO TBEC Oral Take 20 mg by mouth daily.     . TRIAMCINOLONE ACETONIDE 55 MCG/ACT NA INHA Nasal Place 2 sprays into the nose daily.        BP 174/67  Pulse 95  Temp 98.5 F (36.9 C) (Oral)  Resp 18  SpO2 99%  LMP 09/14/2011  Physical Exam  Nursing note and vitals  reviewed. Constitutional: She is oriented to person, place, and time. No distress.       obese  HENT:  Head: Normocephalic and atraumatic.  Eyes: Conjunctivae normal are normal.  Neck: Normal range of motion.  Cardiovascular: Normal rate, regular rhythm and normal heart sounds.   Pulmonary/Chest: Effort normal and breath sounds normal.  Musculoskeletal: Normal range of motion.  Neurological: She is alert and oriented to person, place, and time.  Skin: Skin is warm and dry. No erythema.  Psychiatric: Her speech is normal. Her affect is blunt. She is agitated.    ED Course  Procedures (including critical care time)  Labs Reviewed - No data to display No results found.   1. Medication refill       MDM  85 show female requesting change in the dose of her medicine in the way it was written. I discussed this with Dr. Rulon Abide due to patient threatening to complain about the PA last week who saw her. He advised me to refill the prescription the way she would like it written.      Trevor Mace, PA-C 04/22/12 2102

## 2012-04-22 NOTE — ED Notes (Addendum)
Pt reports being seen on 04/13/2012 and received an Rx difference from regularly prescribed dosage for blood pressure and is requesting another Rx .NAD.

## 2012-04-22 NOTE — ED Notes (Signed)
Patient is alert and oriented x3.  She is does not have any medical complaints at this time. She is trying to get her medication corrected from Clonidine 0.1 mg BID  Back to Clonidine 0.2 Daily.  She is not showing any signs of distress at this time.

## 2012-04-22 NOTE — ED Notes (Signed)
Patient is alert and oriented x3.  She was given DC instructions and follow up visit instructions.  Patient gave verbal understanding. She was DC ambulatory under his own power to home.  V/S stable.  He was not showing any signs of distress on DC 

## 2012-04-25 ENCOUNTER — Emergency Department (HOSPITAL_COMMUNITY)
Admission: EM | Admit: 2012-04-25 | Discharge: 2012-04-25 | Disposition: A | Discharge status: Home / Self Care | Payer: Self-pay | Attending: Emergency Medicine | Admitting: Emergency Medicine

## 2012-04-25 ENCOUNTER — Encounter (HOSPITAL_COMMUNITY): Payer: Self-pay | Admitting: Emergency Medicine

## 2012-04-25 DIAGNOSIS — L03115 Cellulitis of right lower limb: Secondary | ICD-10-CM

## 2012-04-25 DIAGNOSIS — L02419 Cutaneous abscess of limb, unspecified: Secondary | ICD-10-CM | POA: Insufficient documentation

## 2012-04-25 DIAGNOSIS — L089 Local infection of the skin and subcutaneous tissue, unspecified: Secondary | ICD-10-CM | POA: Insufficient documentation

## 2012-04-25 DIAGNOSIS — W57XXXA Bitten or stung by nonvenomous insect and other nonvenomous arthropods, initial encounter: Secondary | ICD-10-CM | POA: Insufficient documentation

## 2012-04-25 DIAGNOSIS — S30860A Insect bite (nonvenomous) of lower back and pelvis, initial encounter: Secondary | ICD-10-CM | POA: Insufficient documentation

## 2012-04-25 MED ORDER — TRAMADOL HCL 50 MG PO TABS: 50.0000 mg | ORAL_TABLET | Freq: Four times a day (QID) | ORAL | Status: DC | PRN

## 2012-04-25 MED ORDER — DIPHENHYDRAMINE HCL 25 MG PO TABS: 25.0000 mg | ORAL_TABLET | Freq: Four times a day (QID) | ORAL | Status: DC | PRN

## 2012-04-25 MED ORDER — SULFAMETHOXAZOLE-TMP DS 800-160 MG PO TABS
1.0000 | ORAL_TABLET | Freq: Once | ORAL | Status: AC
  Administered 2012-04-25: 1 via ORAL
  Filled 2012-04-25: qty 1

## 2012-04-25 MED ORDER — CEPHALEXIN 500 MG PO CAPS
500.0000 mg | ORAL_CAPSULE | Freq: Once | ORAL | Status: AC
  Administered 2012-04-25: 500 mg via ORAL
  Filled 2012-04-25: qty 1

## 2012-04-25 MED ORDER — CEPHALEXIN 500 MG PO CAPS: 500.0000 mg | ORAL_CAPSULE | Freq: Four times a day (QID) | ORAL | Status: DC

## 2012-04-25 MED ORDER — DIPHENHYDRAMINE HCL 25 MG PO CAPS
50.0000 mg | ORAL_CAPSULE | Freq: Once | ORAL | Status: AC
  Administered 2012-04-25: 50 mg via ORAL
  Filled 2012-04-25: qty 2

## 2012-04-25 MED ORDER — SULFAMETHOXAZOLE-TRIMETHOPRIM 800-160 MG PO TABS: 2.0000 | ORAL_TABLET | Freq: Two times a day (BID) | ORAL | Status: DC

## 2012-04-25 NOTE — ED Notes (Signed)
Pt alert and oriented x4. Respirations even and unlabored, bilateral symmetrical rise and fall of chest. Skin warm and dry. In no acute distress. Denies needs.   

## 2012-04-25 NOTE — ED Notes (Addendum)
Red raised, itching areas on whole body L/lower leg swollen and red from midcalf to foot C/o joint pain Pt was exposed to bed bugs 2 days ago

## 2012-04-25 NOTE — ED Provider Notes (Signed)
History     CSN: [id number]  Arrival date & time 04/25/12  1521   First MD Initiated Contact with Patient 04/25/12 1526      Chief Complaint  Patient presents with  . Rash    r/lower leg red and swollen  . Insect Bite    24 hr hx of red raised areas on whole body    (Consider location/radiation/quality/duration/timing/severity/associated sxs/prior treatment) HPI Comments: Patient reports she was exposed to bedbugs 3 nights ago, and last night develop increased pain and swelling of right lower leg.  Pt was with a friend who picked up a couch from the side of the road and took it home, friend developed pruritic lesions and was seen in ED, diagnosed with bedbugs.  Pt states after that visit she went back home with friend and slept on the same couch, the next day was covered in similar pruritic lesions.  Reports her right lower leg is burning and itching, tender to palpation.  Denies fevers, weakness or numbness of the legs.    Patient is a 61 y.o. female presenting with rash. The history is provided by the patient.  Rash     Past Medical History  Diagnosis Date  . Thumb pain     right  . Arthritis     right anke/foot  . Allergic rhinitis   . Asthma   . HTN (hypertension)   . GERD (gastroesophageal reflux disease)   . Tendinitis   . Knee pain   . Bursitis   . Carpal tunnel syndrome     Past Surgical History  Procedure Date  . Uterine fibroid surgery 1988    Dr Randell Patient  . Repeat fibroid excision 1997    Dr Randell Patient  . Arthroscopic left knee surgery 1992  . Right heel surgery on plantar facitis 1995  . Wrist surgery     Family History  Problem Relation Age of Onset  . Uterine cancer Mother   . Hypertension Mother   . Hypertension Father     History  Substance Use Topics  . Smoking status: Never Smoker   . Smokeless tobacco: Never Used  . Alcohol Use: No    OB History    Grav Para Term Preterm Abortions TAB SAB Ect Mult Living                  Review of  Systems  Constitutional: Negative for fever and chills.  Skin: Positive for rash.  Neurological: Negative for weakness and numbness.    Allergies  Codeine; Doxycycline; Penicillins; and Vicodin  Home Medications   Current Outpatient Rx  Name Route Sig Dispense Refill  . ALBUTEROL SULFATE HFA 108 (90 BASE) MCG/ACT IN AERS Inhalation Inhale 1-2 puffs into the lungs every 6 (six) hours as needed for wheezing. 1 Inhaler 3  . AMLODIPINE BESYLATE 5 MG PO TABS Oral Take 5 mg by mouth at bedtime.     . CELECOXIB 200 MG PO CAPS Oral Take 200 mg by mouth daily.    [name] CLONIDINE HCL 0.1 MG PO TABS Oral Take 2 tablets (0.2 mg total) by mouth 2 (two) times daily. 30 tablet 3  . FLUTICASONE-SALMETEROL 100-50 MCG/DOSE IN AEPB Inhalation Inhale 1 puff into the lungs every 12 (twelve) hours. 60 each 3  . FUROSEMIDE 40 MG PO TABS Oral Take 40 mg by mouth daily.     [name] LORATADINE 10 MG PO TABS Oral Take 10 mg by mouth daily.    . MOMETASONE FUROATE 50  MCG/ACT NA SUSP Nasal Place 2 sprays into the nose daily.    . NYSTATIN 100000 UNIT/GM EX CREA  Apply to affected area 2 times daily 30 g 1  . OMEPRAZOLE 10 MG PO CPDR Oral Take 10 mg by mouth daily.    [name] RABEPRAZOLE SODIUM 20 MG PO TBEC Oral Take 20 mg by mouth daily.     . TRIAMCINOLONE ACETONIDE 55 MCG/ACT NA INHA Nasal Place 2 sprays into the nose daily.        BP 174/80  Pulse 105  Temp 98.3 F (36.8 C) (Oral)  Resp 18  Wt 229 lb (103.874 kg)  SpO2 97%  LMP 09/14/2011  Physical Exam  Nursing note and vitals reviewed. Constitutional: She appears well-developed and well-nourished. No distress.  HENT:  Head: Normocephalic and atraumatic.  Neck: Neck supple.  Pulmonary/Chest: Effort normal.  Neurological: She is alert.  Skin: She is not diaphoretic.          Multiple raised erythematous lesions over trunk and legs.  No drainage, nontender.      ED Course  Procedures (including critical care time)  Labs Reviewed - No data to display No  results found.   1. Insect bites   2. Cellulitis of right lower leg     MDM  Pt with several days of insect bite lesions (thought to be bed bugs), now with cellulitis of right lower leg.  Pt is not diabetic, is not immunocompromised.  She is afebrile, nontoxic.  Pt given first doses of bactrim and keflex in ED.  D/C home with same, as well as tramadol and benadryl for symptomatic treatment. Cellulitis marked by me in ED.  Discussed diagnosis, care plan, and plan for follow up with patient.  Pt given return precautions.  Pt verbalizes understanding and agrees with plan.           [place], [place] 04/25/12 1730

## 2012-04-27 NOTE — ED Provider Notes (Signed)
Medical screening examination/treatment/procedure(s) were performed by non-physician practitioner and as supervising physician I was immediately available for consultation/collaboration.   Hurman Horn, MD 04/27/12 [phone]

## 2012-04-28 ENCOUNTER — Encounter (HOSPITAL_COMMUNITY): Payer: Self-pay | Admitting: Emergency Medicine

## 2012-04-28 ENCOUNTER — Emergency Department (HOSPITAL_COMMUNITY)
Admission: EM | Admit: 2012-04-28 | Discharge: 2012-04-28 | Disposition: A | Discharge status: Home / Self Care | Payer: Self-pay | Attending: Emergency Medicine | Admitting: Emergency Medicine

## 2012-04-28 DIAGNOSIS — Z880 Allergy status to penicillin: Secondary | ICD-10-CM | POA: Insufficient documentation

## 2012-04-28 DIAGNOSIS — L0291 Cutaneous abscess, unspecified: Secondary | ICD-10-CM | POA: Insufficient documentation

## 2012-04-28 DIAGNOSIS — I10 Essential (primary) hypertension: Secondary | ICD-10-CM | POA: Insufficient documentation

## 2012-04-28 DIAGNOSIS — J45909 Unspecified asthma, uncomplicated: Secondary | ICD-10-CM | POA: Insufficient documentation

## 2012-04-28 DIAGNOSIS — Z8249 Family history of ischemic heart disease and other diseases of the circulatory system: Secondary | ICD-10-CM | POA: Insufficient documentation

## 2012-04-28 DIAGNOSIS — L0390 Cellulitis, unspecified: Secondary | ICD-10-CM

## 2012-04-28 DIAGNOSIS — W57XXXA Bitten or stung by nonvenomous insect and other nonvenomous arthropods, initial encounter: Secondary | ICD-10-CM | POA: Insufficient documentation

## 2012-04-28 DIAGNOSIS — Z8049 Family history of malignant neoplasm of other genital organs: Secondary | ICD-10-CM | POA: Insufficient documentation

## 2012-04-28 DIAGNOSIS — Z881 Allergy status to other antibiotic agents status: Secondary | ICD-10-CM | POA: Insufficient documentation

## 2012-04-28 DIAGNOSIS — Z885 Allergy status to narcotic agent status: Secondary | ICD-10-CM | POA: Insufficient documentation

## 2012-04-28 DIAGNOSIS — K219 Gastro-esophageal reflux disease without esophagitis: Secondary | ICD-10-CM | POA: Insufficient documentation

## 2012-04-28 DIAGNOSIS — T148 Other injury of unspecified body region: Secondary | ICD-10-CM | POA: Insufficient documentation

## 2012-04-28 MED ORDER — PERMETHRIN 5 % EX CREA: TOPICAL_CREAM | CUTANEOUS | Status: DC

## 2012-04-28 NOTE — Discharge Instructions (Signed)
Bedbugs Bedbugs are tiny bugs that live in and around beds. During the day, they hide in mattresses and other places near beds. They come out at night and bite people lying in bed. They need blood to live and grow. Bedbugs can be found in beds anywhere. Usually, they are found in places where many people come and go (hotels, shelters, hospitals). It does not matter whether the place is dirty or clean. Getting bitten by bedbugs rarely causes a medical problem. The biggest problem can be getting rid of them. This often takes the work of a Financial risk analyst. CAUSES  Less use of pesticides. Bedbugs were common before the 1950s. Then, strong pesticides such as DDT nearly wiped them out. Today, these pesticides are not used because they harm the environment and can cause health problems.  More travel. Besides mattresses, bedbugs can also live in clothing and luggage. They can come along as people travel from place to place. Bedbugs are more common in certain parts of the world. When people travel to those areas, the bugs can come home with them.  Presence of birds and bats. Bedbugs often infest birds and bats. If you have these animals in or near your home, bedbugs may infest your house, too. SYMPTOMS It does not hurt to be bitten by a bedbug. You will probably not wake up when you are bitten. Bedbugs usually bite areas of the skin that are not covered. Symptoms may show when you wake up, or they may take a day or more to show up. Symptoms may include:  Small red bumps on the skin. These might be lined up in a row or clustered in a group.  A darker red dot in the middle of red bumps.  Blisters on the skin. There may be swelling and very bad itching. These may be signs of an allergic reaction. This does not happen often. DIAGNOSIS Bedbug bites might look and feel like other types of insect bites. The bugs do not stay on the body like ticks or lice. They bite, drop off, and crawl away to hide. Your  caregiver will probably:  Ask about your symptoms.  Ask about your recent activities and travel.  Check your skin for bedbug bites.  Ask you to check at home for signs of bedbugs. You should look for:  Spots or stains on the bed or nearby. This could be from bedbugs that were crushed or from their eggs or waste.  Bedbugs themselves. They are reddish-brown, oval, and flat. They do not fly. They are about the size of an apple seed.  Places to look for bedbugs include:  Beds. Check mattresses, headboards, box springs, and bed frames.  On drapes and curtains near the bed.  Under carpeting in the bedroom.  Behind electrical outlets.  Behind any wallpaper that is peeling.  Inside luggage. TREATMENT Most bedbug bites do not need treatment. They usually go away on their own in a few days. The bites are not dangerous. However, treatment may be needed if you have scratched so much that your skin has become infected. You may also need treatment if you are allergic to bedbug bites. Treatment options include:  A drug that stops swelling and itching (corticosteroid). Usually, a cream is rubbed on the skin. If you have a bad rash, you may be given a corticosteroid pill.  Oral antihistamines. These are pills to help control itching.  Antibiotic medicines. An antibiotic may be prescribed for infected skin. HOME CARE INSTRUCTIONS  Take any medicine prescribed by your caregiver for your bites. Follow the directions carefully.  Consider wearing pajamas with long sleeves and pant legs.  Your bedroom may need to be treated. A pest control expert should make sure the bedbugs are gone. You may need to throw away mattresses or luggage. Ask the pest control expert what you can do to keep the bedbugs from coming back. Common suggestions include:  Putting a plastic cover over your mattress.  Washing and drying your clothes and bedding in hot water and a hot dryer. The temperature should be hotter  than 120 F (48.9 C). Bedbugs are killed by high temperatures.  Vacuuming carefully all around your bed. Vacuum in all cracks and crevices where the bugs might hide. Do this often.  Carefully checking all used furniture, bedding, or clothes that you bring into your house.  Eliminating bird nests and bat roosts.  If you get bedbug bites when traveling, check all your possessions carefully before bringing them into your house. If you find any bugs on clothes or in your luggage, consider throwing those items away. SEEK MEDICAL CARE IF:  You have red bug bites that keep coming back.  You have red bug bites that itch badly.  You have bug bites that cause a skin rash.  You have scratch marks that are red and sore. SEEK IMMEDIATE MEDICAL CARE IF: You have a fever. Document Released: 08/13/2010 Document Revised: 10/03/2011 Document Reviewed: 08/13/2010 [facility] Patient Information 2013 [place], [place]. Cellulitis Cellulitis is an infection of the skin and the tissue beneath it. The infected area is usually red and tender. Cellulitis occurs most often in the arms and lower legs.  CAUSES  Cellulitis is caused by bacteria that enter the skin through cracks or cuts in the skin. The most common types of bacteria that cause cellulitis are Staphylococcus and Streptococcus. SYMPTOMS   Redness and warmth.  Swelling.  Tenderness or pain.  Fever. DIAGNOSIS  Your caregiver can usually determine what is wrong based on a physical exam. Blood tests may also be done. TREATMENT  Treatment usually involves taking an antibiotic medicine. HOME CARE INSTRUCTIONS   Take your antibiotics as directed. Finish them even if you start to feel better.  Keep the infected arm or leg elevated to reduce swelling.  Apply a warm cloth to the affected area up to 4 times per day to relieve pain.  Only take over-the-counter or prescription medicines for pain, discomfort, or fever as directed by your  caregiver.  Keep all follow-up appointments as directed by your caregiver. SEEK MEDICAL CARE IF:   You notice red streaks coming from the infected area.  Your red area gets larger or turns dark in color.  Your bone or joint underneath the infected area becomes painful after the skin has healed.  Your infection returns in the same area or another area.  You notice a swollen bump in the infected area.  You develop new symptoms. SEEK IMMEDIATE MEDICAL CARE IF:   You have a fever.  You feel very sleepy.  You develop vomiting or diarrhea.  You have a general ill feeling (malaise) with muscle aches and pains. MAKE SURE YOU:   Understand these instructions.  Will watch your condition.  Will get help right away if you are not doing well or get worse. Document Released: 04/20/2005 Document Revised: 01/10/2012 Document Reviewed: 09/26/2011 [facility] Patient Information 2013 [place], [place].

## 2012-04-28 NOTE — ED Provider Notes (Signed)
History     CSN: [id number]  Arrival date & time 04/28/12  1431   First MD Initiated Contact with Patient 04/28/12 1657      Chief Complaint  Patient presents with  . Cellulitis  . recheck     (Consider location/radiation/quality/duration/timing/severity/associated sxs/prior treatment) HPI  She does here for recheck of her cellulitis. She was seen on October 2 and prescribed Keflex and Bactrim for cellulitis. Her wounds were outlined with pain and she said that they have been resolving. She does admit that she's got a few new small marks but they have been getting smaller and are nor near as large as the other wounds were. She's not had any weakness, fevers, nausea, vomiting, diarrhea, weight loss, arthralgias. She says that she is feeling better and taking the medication as prescribed.  Past Medical History  Diagnosis Date  . Thumb pain     right  . Arthritis     right anke/foot  . Allergic rhinitis   . Asthma   . HTN (hypertension)   . GERD (gastroesophageal reflux disease)   . Tendinitis   . Knee pain   . Bursitis   . Carpal tunnel syndrome     Past Surgical History  Procedure Date  . Uterine fibroid surgery 1988    Dr Randell Patient  . Repeat fibroid excision 1997    Dr Randell Patient  . Arthroscopic left knee surgery 1992  . Right heel surgery on plantar facitis 1995  . Wrist surgery     Family History  Problem Relation Age of Onset  . Uterine cancer Mother   . Hypertension Mother   . Hypertension Father     History  Substance Use Topics  . Smoking status: Never Smoker   . Smokeless tobacco: Never Used  . Alcohol Use: No    OB History    Grav Para Term Preterm Abortions TAB SAB Ect Mult Living                  Review of Systems   Review of Systems  Gen: no weight loss, fevers, chills, night sweats  Eyes: no discharge or drainage, no occular pain or visual changes  Nose: no epistaxis or rhinorrhea  Mouth: no dental pain, no sore throat  Neck: no neck pain    Lungs:No wheezing, coughing or hemoptysis CV: no chest pain, palpitations, dependent edema or orthopnea  Abd: no abdominal pain, nausea, vomiting  GU: no dysuria or gross hematuria  MSK:  No abnormalities  Neuro: no headache, no focal neurologic deficits  Skin: cellulitis to skin Psyche: negative.    Allergies  Codeine; Doxycycline; and Penicillins  Home Medications   Current Outpatient Rx  Name Route Sig Dispense Refill  . ALBUTEROL SULFATE HFA 108 (90 BASE) MCG/ACT IN AERS Inhalation Inhale 1-2 puffs into the lungs every 6 (six) hours as needed for wheezing. 1 Inhaler 3  . AMLODIPINE BESYLATE 5 MG PO TABS Oral Take 5 mg by mouth at bedtime.     . CELECOXIB 200 MG PO CAPS Oral Take 200 mg by mouth daily.    . CEPHALEXIN 500 MG PO CAPS Oral Take 1 capsule (500 mg total) by mouth 4 (four) times daily. 28 capsule 0  . CLONIDINE HCL 0.1 MG PO TABS Oral Take 2 tablets (0.2 mg total) by mouth 2 (two) times daily. 30 tablet 3  . DIPHENHYDRAMINE HCL 25 MG PO TABS Oral Take 1 tablet (25 mg total) by mouth every 6 (six) hours as  needed for itching. 20 tablet 0  . FLUTICASONE-SALMETEROL 100-50 MCG/DOSE IN AEPB Inhalation Inhale 1 puff into the lungs every 12 (twelve) hours. 60 each 3  . FUROSEMIDE 40 MG PO TABS Oral Take 40 mg by mouth daily.     [name] LORATADINE 10 MG PO TABS Oral Take 10 mg by mouth daily.    . MOMETASONE FUROATE 50 MCG/ACT NA SUSP Nasal Place 2 sprays into the nose daily.    . NYSTATIN 100000 UNIT/GM EX CREA  Apply to affected area 2 times daily 30 g 1  . OMEPRAZOLE 10 MG PO CPDR Oral Take 10 mg by mouth daily.    [name] RABEPRAZOLE SODIUM 20 MG PO TBEC Oral Take 20 mg by mouth daily.     . SULFAMETHOXAZOLE-TRIMETHOPRIM 800-160 MG PO TABS Oral Take 2 tablets by mouth 2 (two) times daily. 28 tablet 0  . TRAMADOL HCL 50 MG PO TABS Oral Take 1 tablet (50 mg total) by mouth every 6 (six) hours as needed for pain. 15 tablet 0  . TRIAMCINOLONE ACETONIDE 55 MCG/ACT NA INHA Nasal Place 2  sprays into the nose daily.      [name] PERMETHRIN 5 % EX CREA  Apply to affected area once 10 g 0    BP 169/93  Pulse 94  Temp 98.6 F (37 C) (Oral)  Resp 20  SpO2 97%  LMP 09/14/2011  Physical Exam  Nursing note and vitals reviewed. Constitutional: She appears well-developed and well-nourished. No distress.  HENT:  Head: Normocephalic and atraumatic.  Eyes: Pupils are equal, round, and reactive to light.  Neck: Normal range of motion. Neck supple.  Cardiovascular: Normal rate and regular rhythm.   Pulmonary/Chest: Effort normal.  Abdominal: Soft.  Neurological: She is alert.  Skin: Skin is warm and dry. Rash noted.          cellulitis as illustrated with some new small insect bite wounds. Wound receeding from outlined area. Very mild tenderness to palpation with no abscessing to wounds.     ED Course  Procedures (including critical care time)  Labs Reviewed - No data to display No results found.   1. Insect bites   2. Cellulitis       MDM  Patient's wound Thursday only. She has a line outlining the wound area and it is resolving from the wound area. The patient has a couple of nevi from dogs therefore I've added on permethrin cream to her Keflex and Bactrim. I have told her that she needs to come back on Wednesday for repeat evaluation of her wounds she says that she will come back and that she understands how important this.  Pt has been advised of the symptoms that warrant their return to the ED. Patient has voiced understanding and has agreed to follow-up with the PCP or specialist.         Dorthula Matas, PA 04/28/12 [phone]

## 2012-04-28 NOTE — ED Notes (Signed)
Pt was seen on 10/2 for cellulitis on right lower leg, told to come back in a few days for recheck. Cellulitis area marked with skin marker. At present pt reports legs itch and sting. Small bumps/ blisters noted on left arm and pt reports on back of leg. Were present when seen on 10/2. Pt reports they have not gotten worse.

## 2012-04-28 NOTE — ED Notes (Signed)
Received report from AnnMarie, RN. Assumed care at this time.  

## 2012-04-28 NOTE — ED Notes (Deleted)
Pt was seen on 10/2 for cellulitis on right lower leg, told to come back in a few days for recheck. Cellulitis area marked with skin marker. At present pt reports legs itch and sting. Small bumps/ blisters noted on left arm and pt reports on back of leg. Were present when seen on 10/2. Pt reports they have not gotten worse.  This note written by hasz, rn. Will delete and document under correct name

## 2012-04-28 NOTE — ED Provider Notes (Signed)
Pt PCP office permanently closed, refilled clonidine to specifications on prior Rx to patient satisfaction to avoid rebound HTN.  Medical screening examination/treatment/procedure(s) were conducted as a shared visit with non-physician practitioner(s) and myself.  I personally evaluated the patient during the encounter Jones Skene, M.D.   Jones Skene, MD 04/28/12 0900

## 2012-05-01 NOTE — ED Provider Notes (Signed)
Medical screening examination/treatment/procedure(s) were performed by non-physician practitioner and as supervising physician I was immediately available for consultation/collaboration.  Alilah Mcmeans, MD 05/01/12 2355 

## 2012-07-13 ENCOUNTER — Ambulatory Visit: Payer: Self-pay | Admitting: Adult Health

## 2012-08-30 ENCOUNTER — Emergency Department (HOSPITAL_COMMUNITY)
Admission: EM | Admit: 2012-08-30 | Discharge: 2012-08-30 | Discharge status: Absent without leave | Payer: No Typology Code available for payment source | Source: Home / Self Care

## 2012-08-30 ENCOUNTER — Encounter (HOSPITAL_COMMUNITY): Payer: Self-pay

## 2012-08-30 ENCOUNTER — Emergency Department (INDEPENDENT_AMBULATORY_CARE_PROVIDER_SITE_OTHER)
Admission: EM | Admit: 2012-08-30 | Discharge: 2012-08-30 | Disposition: A | Discharge status: Home / Self Care | Payer: No Typology Code available for payment source | Source: Home / Self Care | Attending: Family Medicine | Admitting: Family Medicine

## 2012-08-30 DIAGNOSIS — I10 Essential (primary) hypertension: Secondary | ICD-10-CM

## 2012-08-30 DIAGNOSIS — K529 Noninfective gastroenteritis and colitis, unspecified: Secondary | ICD-10-CM

## 2012-08-30 DIAGNOSIS — J45909 Unspecified asthma, uncomplicated: Secondary | ICD-10-CM

## 2012-08-30 DIAGNOSIS — K029 Dental caries, unspecified: Secondary | ICD-10-CM

## 2012-08-30 DIAGNOSIS — J3089 Other allergic rhinitis: Secondary | ICD-10-CM

## 2012-08-30 DIAGNOSIS — K5289 Other specified noninfective gastroenteritis and colitis: Secondary | ICD-10-CM

## 2012-08-30 DIAGNOSIS — K219 Gastro-esophageal reflux disease without esophagitis: Secondary | ICD-10-CM

## 2012-08-30 LAB — COMPREHENSIVE METABOLIC PANEL
AST: 24 U/L (ref 0–37)
Albumin: 3.5 g/dL (ref 3.5–5.2)
Alkaline Phosphatase: 135 U/L — ABNORMAL HIGH (ref 39–117)
BUN: 13 mg/dL (ref 6–23)
Potassium: 3.5 mEq/L (ref 3.5–5.1)
Sodium: 135 mEq/L (ref 135–145)
Total Protein: 9.4 g/dL — ABNORMAL HIGH (ref 6.0–8.3)

## 2012-08-30 LAB — CBC
MCHC: 34.4 g/dL (ref 30.0–36.0)
Platelets: 313 10*3/uL (ref 150–400)
RDW: 14 % (ref 11.5–15.5)
WBC: 9.6 10*3/uL (ref 4.0–10.5)

## 2012-08-30 LAB — LIPID PANEL
Cholesterol: 178 mg/dL (ref 0–200)
LDL Cholesterol: 92 mg/dL (ref 0–99)
Total CHOL/HDL Ratio: 3.6 RATIO
VLDL: 37 mg/dL (ref 0–40)

## 2012-08-30 MED ORDER — PROMETHAZINE HCL 12.5 MG PO TABS: 12.5000 mg | ORAL_TABLET | Freq: Four times a day (QID) | ORAL | Status: DC | PRN

## 2012-08-30 NOTE — ED Notes (Signed)
Patients states has been vomiting and diarrhea past 2 days. Congestion and sinus pressure with green and bloody discharge

## 2012-08-30 NOTE — ED Provider Notes (Signed)
History    CSN: [id number]  Arrival date & time 08/30/12  1606   First MD Initiated Contact with Patient 08/30/12 1701     Chief Complaint  Patient presents with  . Emesis   Patient is a 62 y.o. female presenting with vomiting. The history is provided by the patient.  Emesis  Associated symptoms include diarrhea. Pertinent negatives include no abdominal pain.   Pt reports that she has been having nausea and vomiting.  She is having diarrhea.  Pt says that she has had some chills.  She says that she has been riding public transportation.  Pt says that she is having watery diarrhea.  Pt says that it stopped after this morning.  Pt says that she is hungry.  Pt says that she drank some soda and it is staying on her stomach.  She says that her stomach has been bloated.  No further vomiting since yesterday.  She has had some aches in the body.  She has had some sinus congestion.     Past Medical History  Diagnosis Date  . Thumb pain     right  . Arthritis     right anke/foot  . Allergic rhinitis   . Asthma   . HTN (hypertension)   . GERD (gastroesophageal reflux disease)   . Tendinitis   . Knee pain   . Bursitis   . Carpal tunnel syndrome     Past Surgical History  Procedure Date  . Uterine fibroid surgery 1988    Dr Randell Patient  . Repeat fibroid excision 1997    Dr Randell Patient  . Arthroscopic left knee surgery 1992  . Right heel surgery on plantar facitis 1995  . Wrist surgery     Family History  Problem Relation Age of Onset  . Uterine cancer Mother   . Hypertension Mother   . Hypertension Father     History  Substance Use Topics  . Smoking status: Never Smoker   . Smokeless tobacco: Never Used  . Alcohol Use: No   OB History    Grav Para Term Preterm Abortions TAB SAB Ect Mult Living                 Review of Systems  Constitutional: Positive for fatigue.  HENT: Positive for congestion.   Respiratory: Negative.   Cardiovascular: Negative.   Gastrointestinal: Positive  for nausea, vomiting and diarrhea. Negative for abdominal pain and abdominal distention.  Musculoskeletal: Negative.   Neurological: Negative.   Psychiatric/Behavioral: Negative.   All other systems reviewed and are negative.    Allergies  Codeine; Doxycycline; and Penicillins  Home Medications   Current Outpatient Rx  Name  Route  Sig  Dispense  Refill  . ALBUTEROL SULFATE HFA 108 (90 BASE) MCG/ACT IN AERS   Inhalation   Inhale 1-2 puffs into the lungs every 6 (six) hours as needed for wheezing.   1 Inhaler   3   . AMLODIPINE BESYLATE 5 MG PO TABS   Oral   Take 5 mg by mouth at bedtime.          . CELECOXIB 200 MG PO CAPS   Oral   Take 200 mg by mouth daily.         . CEPHALEXIN 500 MG PO CAPS   Oral   Take 1 capsule (500 mg total) by mouth 4 (four) times daily.   28 capsule   0   . CLONIDINE HCL 0.1 MG PO TABS   Oral  Take 2 tablets (0.2 mg total) by mouth 2 (two) times daily.   30 tablet   3   . DIPHENHYDRAMINE HCL 25 MG PO TABS   Oral   Take 1 tablet (25 mg total) by mouth every 6 (six) hours as needed for itching.   20 tablet   0   . FLUTICASONE-SALMETEROL 100-50 MCG/DOSE IN AEPB   Inhalation   Inhale 1 puff into the lungs every 12 (twelve) hours.   60 each   3   . FUROSEMIDE 40 MG PO TABS   Oral   Take 40 mg by mouth daily.          [name] LORATADINE 10 MG PO TABS   Oral   Take 10 mg by mouth daily.         . MOMETASONE FUROATE 50 MCG/ACT NA SUSP   Nasal   Place 2 sprays into the nose daily.         . NYSTATIN 100000 UNIT/GM EX CREA      Apply to affected area 2 times daily   30 g   1   . OMEPRAZOLE 10 MG PO CPDR   Oral   Take 10 mg by mouth daily.         [name] PERMETHRIN 5 % EX CREA      Apply to affected area once   10 g   0   . RABEPRAZOLE SODIUM 20 MG PO TBEC   Oral   Take 20 mg by mouth daily.          . SULFAMETHOXAZOLE-TRIMETHOPRIM 800-160 MG PO TABS   Oral   Take 2 tablets by mouth 2 (two) times daily.   28  tablet   0   . TRAMADOL HCL 50 MG PO TABS   Oral   Take 1 tablet (50 mg total) by mouth every 6 (six) hours as needed for pain.   15 tablet   0   . TRIAMCINOLONE ACETONIDE 55 MCG/ACT NA INHA   Nasal   Place 2 sprays into the nose daily.             BP 152/84  Pulse 86  Temp 99 F (37.2 C) (Oral)  Resp 16  SpO2 99%  Physical Exam  Nursing note and vitals reviewed. Constitutional: She is oriented to person, place, and time. She appears well-developed and well-nourished. No distress.  HENT:  Head: Normocephalic and atraumatic.  Right Ear: External ear normal.  Left Ear: External ear normal.  Nose: Mucosal edema and rhinorrhea present.  Eyes: Conjunctivae normal and EOM are normal. Pupils are equal, round, and reactive to light.  Neck: Normal range of motion. Neck supple. No JVD present. No tracheal deviation present. No thyromegaly present.  Cardiovascular: Normal rate, regular rhythm and normal heart sounds.   Pulmonary/Chest: Effort normal and breath sounds normal.  Abdominal: Soft. Bowel sounds are normal. She exhibits no distension and no mass. There is no tenderness. There is no rebound and no guarding.  Musculoskeletal: Normal range of motion. She exhibits no edema and no tenderness.  Lymphadenopathy:    She has no cervical adenopathy.  Neurological: She is alert and oriented to person, place, and time.  Skin: Skin is warm and dry. No erythema.  Psychiatric: She has a normal mood and affect. Her behavior is normal. Judgment and thought content normal.    ED Course  Procedures (including critical care time)  Labs Reviewed - No data to display No results found.  No diagnosis found.  MDM  IMPRESSION  Gastroenteritis  Allergic rhinitis  Osteoarthritis  hypertension  RECOMMENDATIONS / PLAN Phenergan 12.5 mg po every 6 hours prn nausea Pt given instructions to take her BP meds Encouraged fluids, rest, RTC if symptoms worsen or don't improve  FOLLOW  UP BP recheck in 2 weeks Medical follow up in 3 months  The patient was given clear instructions to go to ER or return to medical center if symptoms don't improve, worsen or new problems develop.  The patient verbalized understanding.  The patient was told to call to get lab results if they haven't heard anything in the next week.            Cleora Fleet, MD 08/31/12 [phone]

## 2012-08-31 LAB — HEMOGLOBIN A1C: Mean Plasma Glucose: 192 mg/dL — ABNORMAL HIGH (ref <117)

## 2012-09-01 NOTE — Progress Notes (Signed)
Quick Note:  Please notify patient that her blood sugar came back high and her diabetes test was positive for uncontrolled type 2 diabetes. She needs to start treatment right away. Please send her some information in the mail about diabetes and have her go to [facility] to get a Relion meter and test strips. I'd like her to come to clinic in 2 weeks to discuss her diabetes. Please call in prescription for Metformin ER 500 mg tabs - take 1 tab po daily with breakfast for 1 week, then increase to 1 tab po with breakfast and supper for 2 weeks, then take 2 tabs po BIDAC. #120 tabs, RFx3. Labs should be rechecked in 3 months. Check blood sugars at least once per day. Please schedule patient with the diabetes education center.   Rodney Langton, MD, CDE, FAAFP [facility] [place], [place]   ______

## 2012-09-07 ENCOUNTER — Telehealth (HOSPITAL_COMMUNITY): Payer: Self-pay

## 2012-09-10 NOTE — ED Notes (Signed)
Pt called back lab results given has an appt on 10/10/12 @ 10:30 am [place] [facility]

## 2012-09-18 ENCOUNTER — Encounter: Payer: Self-pay | Admitting: Family Medicine

## 2012-09-18 DIAGNOSIS — E119 Type 2 diabetes mellitus without complications: Secondary | ICD-10-CM | POA: Insufficient documentation

## 2012-10-17 ENCOUNTER — Emergency Department (INDEPENDENT_AMBULATORY_CARE_PROVIDER_SITE_OTHER)
Admission: EM | Admit: 2012-10-17 | Discharge: 2012-10-17 | Disposition: A | Discharge status: Home / Self Care | Payer: No Typology Code available for payment source | Source: Home / Self Care

## 2012-10-17 ENCOUNTER — Encounter (HOSPITAL_COMMUNITY): Payer: Self-pay | Admitting: *Deleted

## 2012-10-17 DIAGNOSIS — L03317 Cellulitis of buttock: Secondary | ICD-10-CM

## 2012-10-17 DIAGNOSIS — L0231 Cutaneous abscess of buttock: Secondary | ICD-10-CM

## 2012-10-17 MED ORDER — SULFAMETHOXAZOLE-TRIMETHOPRIM 800-160 MG PO TABS: 1.0000 | ORAL_TABLET | Freq: Two times a day (BID) | ORAL | Status: DC

## 2012-10-17 NOTE — ED Notes (Signed)
C/o boil on her buttocks onset Friday.  States it feels like one is on the L and one on the right.  Feels like the one on the right has burst and is bleeding and smells terribly.  Soaking it in epsom salts.

## 2012-10-17 NOTE — ED Provider Notes (Signed)
Medical screening examination/treatment/procedure(s) were performed by resident physician or non-physician practitioner and as supervising physician I was immediately available for consultation/collaboration.   Keyonni Percival DOUGLAS MD.   Blanca Carreon D Doyel Mulkern, MD 10/17/12 2050 

## 2012-10-17 NOTE — ED Provider Notes (Signed)
History     CSN: [id number]  Arrival date & time 10/17/12  1516   None     Chief Complaint  Patient presents with  . Abscess    (Consider location/radiation/quality/duration/timing/severity/associated sxs/prior treatment) HPI Comments: 62 year old morbidly obese female complaining of a boil on her right inner buttock. It has been draining at home, is foul-smelling and painful. Denies fever chills or systemic symptoms. She first noticed it approximately 5 days ago and has been sitting in warm water applying warm compresses.   Past Medical History  Diagnosis Date  . Thumb pain     right  . Arthritis     right anke/foot  . Allergic rhinitis   . Asthma   . HTN (hypertension)   . GERD (gastroesophageal reflux disease)   . Tendinitis   . Knee pain   . Bursitis   . Carpal tunnel syndrome     Past Surgical History  Procedure Laterality Date  . Uterine fibroid surgery  1988    Dr Randell Patient  . Repeat fibroid excision  1997    Dr Randell Patient  . Arthroscopic left knee surgery  1992  . Right heel surgery on plantar facitis  1995  . Wrist surgery      Family History  Problem Relation Age of Onset  . Uterine cancer Mother   . Hypertension Mother   . Hypertension Father     History  Substance Use Topics  . Smoking status: Never Smoker   . Smokeless tobacco: Never Used  . Alcohol Use: No    OB History   Grav Para Term Preterm Abortions TAB SAB Ect Mult Living                  Review of Systems  Constitutional: Negative.   Respiratory: Negative.   Gastrointestinal: Negative.   Genitourinary: Negative.   Skin:       As in history of present illness    Allergies  Codeine; Doxycycline; and Penicillins  Home Medications   Current Outpatient Rx  Name  Route  Sig  Dispense  Refill  . albuterol (PROVENTIL HFA;VENTOLIN HFA) 108 (90 BASE) MCG/ACT inhaler   Inhalation   Inhale 1-2 puffs into the lungs every 6 (six) hours as needed for wheezing.   1 Inhaler   3   .  amLODipine (NORVASC) 5 MG tablet   Oral   Take 5 mg by mouth at bedtime.          . celecoxib (CELEBREX) 200 MG capsule   Oral   Take 200 mg by mouth daily.         . cloNIDine (CATAPRES) 0.1 MG tablet   Oral   Take 2 tablets (0.2 mg total) by mouth 2 (two) times daily.   30 tablet   3   . Fluticasone-Salmeterol (ADVAIR DISKUS) 100-50 MCG/DOSE AEPB   Inhalation   Inhale 1 puff into the lungs every 12 (twelve) hours.   60 each   3   . furosemide (LASIX) 40 MG tablet   Oral   Take 40 mg by mouth daily.          [name] loratadine (CLARITIN) 10 MG tablet   Oral   Take 10 mg by mouth daily.         . mometasone (NASONEX) 50 MCG/ACT nasal spray   Nasal   Place 2 sprays into the nose daily.         [name] omeprazole (PRILOSEC) 10 MG capsule   Oral  Take 10 mg by mouth daily.         . RABEprazole (ACIPHEX) 20 MG tablet   Oral   Take 20 mg by mouth daily.          . traMADol (ULTRAM) 50 MG tablet   Oral   Take 1 tablet (50 mg total) by mouth every 6 (six) hours as needed for pain.   15 tablet   0   . cephALEXin (KEFLEX) 500 MG capsule   Oral   Take 1 capsule (500 mg total) by mouth 4 (four) times daily.   28 capsule   0   . diphenhydrAMINE (BENADRYL) 25 MG tablet   Oral   Take 1 tablet (25 mg total) by mouth every 6 (six) hours as needed for itching.   20 tablet   0   . nystatin cream (MYCOSTATIN)      Apply to affected area 2 times daily   30 g   1   . permethrin (ELIMITE) 5 % cream      Apply to affected area once   10 g   0   . promethazine (PHENERGAN) 12.5 MG tablet   Oral   Take 1 tablet (12.5 mg total) by mouth every 6 (six) hours as needed for nausea.   20 tablet   0   . sulfamethoxazole-trimethoprim (SEPTRA DS) 800-160 MG per tablet   Oral   Take 2 tablets by mouth 2 (two) times daily.   28 tablet   0   . sulfamethoxazole-trimethoprim (SEPTRA DS) 800-160 MG per tablet   Oral   Take 1 tablet by mouth 2 (two) times daily. X 7  days   14 tablet   0   . triamcinolone (NASACORT AQ) 55 MCG/ACT nasal inhaler   Nasal   Place 2 sprays into the nose daily.             LMP 09/05/2012  Physical Exam  Nursing note and vitals reviewed. Constitutional: She is oriented to person, place, and time. She appears well-developed and well-nourished. No distress.  Pulmonary/Chest: Effort normal.  Musculoskeletal: She exhibits edema.  Neurological: She is alert and oriented to person, place, and time.  Skin: Skin is warm and dry.  Proximally 67 cm area of induration in the medial aspect of the left buttock with a central area of drainage. The exudate is malodorous and purulent  Psychiatric: She has a normal mood and affect.    ED Course  INCISION AND DRAINAGE Date/Time: 10/17/2012 6:14 PM Performed by: Phineas Real, Hadasah Brugger Authorized by: Phineas Real, Jaclene Bartelt Consent: Verbal consent obtained. Risks and benefits: risks, benefits and alternatives were discussed Consent given by: patient Patient understanding: patient states understanding of the procedure being performed Type: abscess Body area: anogenital Location details: gluteal cleft Anesthesia: local infiltration Local anesthetic: lidocaine 2% with epinephrine Anesthetic total: 9 ml Scalpel size: 11 Incision type: single with marsupialization Complexity: complex Drainage: purulent Drainage amount: copious Wound treatment: drain placed Packing material: none   (including critical care time)  Labs Reviewed - No data to display No results found.   1. Abscess of buttock, left       MDM  Compresses when necessary chief the dressing clean and dry Septra DS one twice a day for 7 days For pain may take her tramadol 2 tablets every 6-8 hours when necessary pain Return to the urgent care in 2 days for wound check and packing removal.          Hayden Rasmussen,  NP 10/17/12 1816

## 2012-10-20 ENCOUNTER — Encounter (HOSPITAL_COMMUNITY): Payer: Self-pay | Admitting: *Deleted

## 2012-10-20 ENCOUNTER — Emergency Department (INDEPENDENT_AMBULATORY_CARE_PROVIDER_SITE_OTHER)
Admission: EM | Admit: 2012-10-20 | Discharge: 2012-10-20 | Disposition: A | Discharge status: Nursing Home (Includes ICF) | Payer: No Typology Code available for payment source | Source: Home / Self Care | Attending: Family Medicine | Admitting: Family Medicine

## 2012-10-20 ENCOUNTER — Inpatient Hospital Stay (HOSPITAL_COMMUNITY)
Admission: EM | Admit: 2012-10-20 | Discharge: 2012-10-23 | DRG: 603 | Disposition: A | Discharge status: Home / Self Care | Payer: No Typology Code available for payment source | Attending: Internal Medicine | Admitting: Internal Medicine

## 2012-10-20 DIAGNOSIS — J3089 Other allergic rhinitis: Secondary | ICD-10-CM

## 2012-10-20 DIAGNOSIS — Z6841 Body Mass Index (BMI) 40.0 and over, adult: Secondary | ICD-10-CM

## 2012-10-20 DIAGNOSIS — E11319 Type 2 diabetes mellitus with unspecified diabetic retinopathy without macular edema: Secondary | ICD-10-CM

## 2012-10-20 DIAGNOSIS — E1149 Type 2 diabetes mellitus with other diabetic neurological complication: Secondary | ICD-10-CM | POA: Diagnosis present

## 2012-10-20 DIAGNOSIS — Z880 Allergy status to penicillin: Secondary | ICD-10-CM

## 2012-10-20 DIAGNOSIS — E119 Type 2 diabetes mellitus without complications: Secondary | ICD-10-CM | POA: Diagnosis present

## 2012-10-20 DIAGNOSIS — D259 Leiomyoma of uterus, unspecified: Secondary | ICD-10-CM

## 2012-10-20 DIAGNOSIS — L03119 Cellulitis of unspecified part of limb: Secondary | ICD-10-CM

## 2012-10-20 DIAGNOSIS — R7309 Other abnormal glucose: Secondary | ICD-10-CM

## 2012-10-20 DIAGNOSIS — I10 Essential (primary) hypertension: Secondary | ICD-10-CM

## 2012-10-20 DIAGNOSIS — J209 Acute bronchitis, unspecified: Secondary | ICD-10-CM

## 2012-10-20 DIAGNOSIS — E1165 Type 2 diabetes mellitus with hyperglycemia: Secondary | ICD-10-CM

## 2012-10-20 DIAGNOSIS — Z8249 Family history of ischemic heart disease and other diseases of the circulatory system: Secondary | ICD-10-CM

## 2012-10-20 DIAGNOSIS — M779 Enthesopathy, unspecified: Secondary | ICD-10-CM

## 2012-10-20 DIAGNOSIS — M79609 Pain in unspecified limb: Secondary | ICD-10-CM

## 2012-10-20 DIAGNOSIS — R5381 Other malaise: Secondary | ICD-10-CM

## 2012-10-20 DIAGNOSIS — IMO0002 Reserved for concepts with insufficient information to code with codable children: Secondary | ICD-10-CM

## 2012-10-20 DIAGNOSIS — M19079 Primary osteoarthritis, unspecified ankle and foot: Secondary | ICD-10-CM

## 2012-10-20 DIAGNOSIS — K029 Dental caries, unspecified: Secondary | ICD-10-CM

## 2012-10-20 DIAGNOSIS — E1159 Type 2 diabetes mellitus with other circulatory complications: Secondary | ICD-10-CM | POA: Diagnosis present

## 2012-10-20 DIAGNOSIS — K219 Gastro-esophageal reflux disease without esophagitis: Secondary | ICD-10-CM

## 2012-10-20 DIAGNOSIS — R609 Edema, unspecified: Secondary | ICD-10-CM

## 2012-10-20 DIAGNOSIS — J45901 Unspecified asthma with (acute) exacerbation: Secondary | ICD-10-CM | POA: Diagnosis not present

## 2012-10-20 DIAGNOSIS — E1142 Type 2 diabetes mellitus with diabetic polyneuropathy: Secondary | ICD-10-CM | POA: Diagnosis present

## 2012-10-20 DIAGNOSIS — L0231 Cutaneous abscess of buttock: Secondary | ICD-10-CM

## 2012-10-20 DIAGNOSIS — J45909 Unspecified asthma, uncomplicated: Secondary | ICD-10-CM

## 2012-10-20 DIAGNOSIS — R5383 Other fatigue: Secondary | ICD-10-CM

## 2012-10-20 DIAGNOSIS — I152 Hypertension secondary to endocrine disorders: Secondary | ICD-10-CM | POA: Diagnosis present

## 2012-10-20 DIAGNOSIS — L02419 Cutaneous abscess of limb, unspecified: Secondary | ICD-10-CM

## 2012-10-20 LAB — BASIC METABOLIC PANEL
Calcium: 9 mg/dL (ref 8.4–10.5)
Creatinine, Ser: 0.85 mg/dL (ref 0.50–1.10)
GFR calc Af Amer: 86 mL/min — ABNORMAL LOW (ref >90)

## 2012-10-20 LAB — CBC WITH DIFFERENTIAL/PLATELET
Basophils Absolute: 0 10*3/uL (ref 0.0–0.1)
Basophils Relative: 0 % (ref 0–1)
Eosinophils Relative: 2 % (ref 0–5)
HCT: 38.3 % (ref 36.0–46.0)
MCHC: 33.9 g/dL (ref 30.0–36.0)
MCV: 90.8 fL (ref 78.0–100.0)
Monocytes Absolute: 0.8 10*3/uL (ref 0.1–1.0)
Neutro Abs: 8.7 10*3/uL — ABNORMAL HIGH (ref 1.7–7.7)
RDW: 13.6 % (ref 11.5–15.5)

## 2012-10-20 MED ORDER — VANCOMYCIN HCL 10 G IV SOLR
2000.0000 mg | Freq: Once | INTRAVENOUS | Status: AC
  Administered 2012-10-20: 2000 mg via INTRAVENOUS
  Filled 2012-10-20: qty 2000

## 2012-10-20 MED ORDER — VANCOMYCIN HCL IN DEXTROSE 1-5 GM/200ML-% IV SOLN
1000.0000 mg | Freq: Two times a day (BID) | INTRAVENOUS | Status: DC
  Administered 2012-10-21 – 2012-10-22 (×4): 1000 mg via INTRAVENOUS
  Filled 2012-10-20 (×6): qty 200

## 2012-10-20 MED ORDER — SODIUM CHLORIDE 0.9 % IV SOLN
1.0000 g | INTRAVENOUS | Status: DC
  Administered 2012-10-21: 1 g via INTRAVENOUS
  Filled 2012-10-20: qty 1

## 2012-10-20 MED ORDER — SODIUM CHLORIDE 0.9 % IV SOLN
INTRAVENOUS | Status: DC
  Administered 2012-10-20: 21:00:00 via INTRAVENOUS

## 2012-10-20 NOTE — ED Provider Notes (Signed)
History     CSN: [id number]  Arrival date & time 10/20/12  1455   First MD Initiated Contact with Patient 10/20/12 1519      Chief Complaint  Patient presents with  . Wound Check    HPI Patient is a 62 y.o. female presenting with wound check. The history is provided by the patient.  Wound Check This is a new problem. The current episode started more than 2 days ago. The problem has been gradually worsening. The symptoms are aggravated by walking. Nothing relieves the symptoms. She has tried a warm compress for the symptoms. The treatment provided no relief.  Pt presents for recheck of wound to (L) inner gluteal fold s/p I&D (of abscess) here on 10/17/2012. Pt reports "packing" fell out yesterday. Has been taking her Septra as directed.  Pt reports increased pain and her feeling that wound is getting worse.   Past Medical History  Diagnosis Date  . Thumb pain     right  . Arthritis     right anke/foot  . Allergic rhinitis   . Asthma   . HTN (hypertension)   . GERD (gastroesophageal reflux disease)   . Tendinitis   . Knee pain   . Bursitis   . Carpal tunnel syndrome     Past Surgical History  Procedure Laterality Date  . Uterine fibroid surgery  1988    Dr Randell Patient  . Repeat fibroid excision  1997    Dr Randell Patient  . Arthroscopic left knee surgery  1992  . Right heel surgery on plantar facitis  1995  . Wrist surgery      Family History  Problem Relation Age of Onset  . Uterine cancer Mother   . Hypertension Mother   . Hypertension Father     History  Substance Use Topics  . Smoking status: Never Smoker   . Smokeless tobacco: Never Used  . Alcohol Use: No    OB History   Grav Para Term Preterm Abortions TAB SAB Ect Mult Living                  Review of Systems  Allergies  Codeine; Doxycycline; and Penicillins  Home Medications   Current Outpatient Rx  Name  Route  Sig  Dispense  Refill  . albuterol (PROVENTIL HFA;VENTOLIN HFA) 108 (90 BASE) MCG/ACT  inhaler   Inhalation   Inhale 1-2 puffs into the lungs every 6 (six) hours as needed for wheezing.   1 Inhaler   3   . amLODipine (NORVASC) 5 MG tablet   Oral   Take 5 mg by mouth at bedtime.          . celecoxib (CELEBREX) 200 MG capsule   Oral   Take 200 mg by mouth daily.         . cloNIDine (CATAPRES) 0.1 MG tablet   Oral   Take 2 tablets (0.2 mg total) by mouth 2 (two) times daily.   30 tablet   3   . Fluticasone-Salmeterol (ADVAIR DISKUS) 100-50 MCG/DOSE AEPB   Inhalation   Inhale 1 puff into the lungs every 12 (twelve) hours.   60 each   3   . furosemide (LASIX) 40 MG tablet   Oral   Take 40 mg by mouth daily.          [name] loratadine (CLARITIN) 10 MG tablet   Oral   Take 10 mg by mouth daily.         . mometasone (  NASONEX) 50 MCG/ACT nasal spray   Nasal   Place 2 sprays into the nose daily.         [name] omeprazole (PRILOSEC) 10 MG capsule   Oral   Take 10 mg by mouth daily.         . RABEprazole (ACIPHEX) 20 MG tablet   Oral   Take 20 mg by mouth daily.          [name] sulfamethoxazole-trimethoprim (SEPTRA DS) 800-160 MG per tablet   Oral   Take 1 tablet by mouth 2 (two) times daily. X 7 days   14 tablet   0   . traMADol (ULTRAM) 50 MG tablet   Oral   Take 1 tablet (50 mg total) by mouth every 6 (six) hours as needed for pain.   15 tablet   0   . triamcinolone (NASACORT AQ) 55 MCG/ACT nasal inhaler   Nasal   Place 2 sprays into the nose daily.           . cephALEXin (KEFLEX) 500 MG capsule   Oral   Take 1 capsule (500 mg total) by mouth 4 (four) times daily.   28 capsule   0   . diphenhydrAMINE (BENADRYL) 25 MG tablet   Oral   Take 1 tablet (25 mg total) by mouth every 6 (six) hours as needed for itching.   20 tablet   0   . nystatin cream (MYCOSTATIN)      Apply to affected area 2 times daily   30 g   1   . permethrin (ELIMITE) 5 % cream      Apply to affected area once   10 g   0   . promethazine (PHENERGAN) 12.5 MG  tablet   Oral   Take 1 tablet (12.5 mg total) by mouth every 6 (six) hours as needed for nausea.   20 tablet   0   . sulfamethoxazole-trimethoprim (SEPTRA DS) 800-160 MG per tablet   Oral   Take 2 tablets by mouth 2 (two) times daily.   28 tablet   0     BP 149/78  Pulse 95  Temp(Src) 98 F (36.7 C) (Oral)  Resp 20  SpO2 100%  LMP 09/05/2012  Physical Exam  Constitutional: She is oriented to person, place, and time. She appears well-developed and well-nourished.  HENT:  Head: Normocephalic and atraumatic.  Eyes: Conjunctivae are normal.  Neck: Neck supple.  Cardiovascular: Normal rate.   Pulmonary/Chest: Effort normal.  Musculoskeletal:  ambulates with cane d/t significant arthritis.   Neurological: She is alert and oriented to person, place, and time.  Skin: Skin is warm and dry.  Wound noted w/ significant induration and erythema. Foul smelling exudate noted.   Psychiatric: She has a normal mood and affect.    ED Course  Procedures (including critical care time)  Labs Reviewed - No data to display No results found.   1. Abscess of buttock, left       MDM  Pt presents for recheck of wound to (L) inner gluteal fold s/p I&D (of abscess) here on 10/17/2012. Pt reports "packing" fell out yesterday. Has been taking her Septra as directed. Wound noted w/ significant induration and erythema. Foul smelling exudate noted. Pt reports increased pain and her feeling that wound is getting worse. Concern for tunneling of abscess. Believe pt would benefit form IV antibiotics and possible CT and surgical consult. Discussed pt with Dr Artis Flock who has also seen pt and he  is in agreement w/ plan.        Leanne Chang, NP 10/20/12 1746

## 2012-10-20 NOTE — ED Notes (Signed)
Was supposed to come yesterday for a recheck but did not have transportation.  Here for packing removal.  Also feels like something is coming up on the other buttock right.

## 2012-10-20 NOTE — H&P (Signed)
PCP: none used to go to [facility]   Chief Complaint:   Left buttock pain  HPI: Mariah [name] is a 62 y.o. female   has a past medical history of Thumb pain; Arthritis; Allergic rhinitis; Asthma; HTN (hypertension); GERD (gastroesophageal reflux disease); Tendinitis; Knee pain; Bursitis; Carpal tunnel syndrome; and Diabetes mellitus without complication.   Presented with  1 week ago she started to hurt she tried warm compresses. 3 days ago it was lanced and packed at urgent care and she was started on bactrim. She was supposed to have the packing removed but given the severity of swelling she was brought to ER and started on IV antibiotics. General Surgery has been contacted and per Er will see patient in AM but for now request medicine admssion for IV antibiotics.   It was also noted that her blood sugars have been elevated. She was not aware of being diabetic but her Hg A1C from Feb. 2014 was 8.4 Denies any fever or chills. She reports extensive hx of arthritis and is trying to apply for disability due to this.   Review of Systems:    Pertinent positives include: buttock pain, joint pain or no joint swelling.  Constitutional:  No weight loss, night sweats, Fevers, chills, fatigue, weight loss  HEENT:  No headaches, Difficulty swallowing,Tooth/dental problems,Sore throat,  No sneezing, itching, ear ache, nasal congestion, post nasal drip,  Cardio-vascular:  No chest pain, Orthopnea, PND, anasarca, dizziness, palpitations.no Bilateral lower extremity swelling  GI:  No heartburn, indigestion, abdominal pain, nausea, vomiting, diarrhea, change in bowel habits, loss of appetite, melena, blood in stool, hematemesis Resp:  no shortness of breath at rest. No dyspnea on exertion, No excess mucus, no productive cough, No non-productive cough, No coughing up of blood.No change in color of mucus.No wheezing. Skin:  no rash or lesions. No jaundice GU:  no dysuria, change in color of urine,  no urgency or frequency. No straining to urinate.  No flank pain.  Musculoskeletal:  No  No decreased range of motion. No back pain.  Psych:  No change in mood or affect. No depression or anxiety. No memory loss.  Neuro: no localizing neurological complaints, no tingling, no weakness, no double vision, no gait abnormality, no slurred speech, no confusion  Otherwise ROS are negative except for above, 10 systems were reviewed  Past Medical History: Past Medical History  Diagnosis Date  . Thumb pain     right  . Arthritis     right anke/foot  . Allergic rhinitis   . Asthma   . HTN (hypertension)   . GERD (gastroesophageal reflux disease)   . Tendinitis   . Knee pain   . Bursitis   . Carpal tunnel syndrome   . Diabetes mellitus without complication    Past Surgical History  Procedure Laterality Date  . Uterine fibroid surgery  1988    Dr Randell Patient  . Repeat fibroid excision  1997    Dr Randell Patient  . Arthroscopic left knee surgery  1992  . Right heel surgery on plantar facitis  1995  . Wrist surgery       Medications: Prior to Admission medications   Medication Sig Start Date End Date Taking? Authorizing Provider  albuterol (PROVENTIL HFA;VENTOLIN HFA) 108 (90 BASE) MCG/ACT inhaler Inhale 1-2 puffs into the lungs every 6 (six) hours as needed for wheezing. 04/13/12  Yes Tia Oliveri, PA-C  amLODipine (NORVASC) 5 MG tablet Take 5 mg by mouth at bedtime.    Yes  Historical Provider, MD  celecoxib (CELEBREX) 200 MG capsule Take 200 mg by mouth daily.   Yes Historical Provider, MD  cloNIDine (CATAPRES) 0.2 MG tablet Take 0.2 mg by mouth 2 (two) times daily.   Yes Historical Provider, MD  fluticasone (FLONASE) 50 MCG/ACT nasal spray Place 2 sprays into the nose daily.   Yes Historical Provider, MD  Fluticasone-Salmeterol (ADVAIR DISKUS) 100-50 MCG/DOSE AEPB Inhale 1 puff into the lungs every 12 (twelve) hours. 09/15/11  Yes Oretha Milch, MD  furosemide (LASIX) 40 MG tablet Take 40 mg by mouth  daily.    Yes Historical Provider, MD  loratadine (CLARITIN) 10 MG tablet Take 10 mg by mouth daily.   Yes Historical Provider, MD  RABEprazole (ACIPHEX) 20 MG tablet Take 20 mg by mouth daily.    Yes Historical Provider, MD  sulfamethoxazole-trimethoprim (SEPTRA DS) 800-160 MG per tablet Take 1 tablet by mouth 2 (two) times daily. X 7 days 10/17/12  Yes Hayden Rasmussen, NP  traMADol (ULTRAM) 50 MG tablet Take 1 tablet (50 mg total) by mouth every 6 (six) hours as needed for pain. 04/25/12  Yes Trixie Dredge, PA-C    Allergies:   Allergies  Allergen Reactions  . Codeine Nausea And Vomiting  . Doxycycline Itching and Nausea And Vomiting  . Penicillins Hives    Social History:  Ambulatory uses crutches or cane Lives at home   reports that she has never smoked. She has never used smokeless tobacco. She reports that she does not drink alcohol or use illicit drugs.   Family History: family history includes Hypertension in her father and mother and Uterine cancer in her mother.    Physical Exam: Patient Vitals for the past 24 hrs:  BP Temp Temp src Pulse Resp SpO2  10/20/12 1805 163/80 mmHg 98.3 F (36.8 C) Oral 96 18 98 %    1. General:  in No Acute distress 2. Psychological: Alert and   Oriented 3. Head/ENT:   Moist   Mucous Membranes                          Head Non traumatic, neck supple                          Normal   Dentition 4. SKIN: normal  Skin turgor,  Skin clean 3-4 cm area of perirectal induration on left buttock. With central area of ulceration with foul green drainage 5. Heart: Regular rate and rhythm no Murmur, Rub or gallop 6. Lungs: Clear to auscultation bilaterally, no wheezes or crackles   7. Abdomen: Soft, non-tender, Non distended, obese 8. Lower extremities: no clubbing, cyanosis, trace edema 9. Neurologically Grossly intact, moving all 4 extremities equally 10. MSK: Normal range of motion  body mass index is unknown because there is no weight on  file.   Labs on Admission:   Recent Labs  10/20/12 2055  NA 134*  K 4.0  CL 97  CO2 25  GLUCOSE 268*  BUN 12  CREATININE 0.85  CALCIUM 9.0   No results found for this basename: AST, ALT, ALKPHOS, BILITOT, PROT, ALBUMIN,  in the last 72 hours No results found for this basename: LIPASE, AMYLASE,  in the last 72 hours  Recent Labs  10/20/12 2055  WBC 12.8*  NEUTROABS 8.7*  HGB 13.0  HCT 38.3  MCV 90.8  PLT 399   No results found for this basename: CKTOTAL,  CKMB, CKMBINDEX, TROPONINI,  in the last 72 hours No results found for this basename: TSH, T4TOTAL, FREET3, T3FREE, THYROIDAB,  in the last 72 hours No results found for this basename: VITAMINB12, FOLATE, FERRITIN, TIBC, IRON, RETICCTPCT,  in the last 72 hours Lab Results  Component Value Date   HGBA1C 8.3* 08/30/2012    The CrCl is unknown because both a height and weight (above a minimum accepted value) are required for this calculation. ABG No results found for this basename: phart, pco2, po2, hco3, tco2, acidbasedef, o2sat     No results found for this basename: DDIMER      Cultures: No results found for this basename: sdes, specrequest, cult, reptstatus       Radiological Exams on Admission: No results found.  Chart has been reviewed  Assessment/Plan  62 yo w new diagnosis of DM2 uncontrolled here with perirectal abscess and cellulitis of left buttock  Present on Admission:  . Left buttock abscess - IV antibiotics broad spectrum given hx of DM will continue on vancomycin, falgyl and cipro for coverage of anaerobes and pseudomonas. Surgical consult in AM for now keep NPO in case she will need operative intervention. CT of pelvis pending to evaluate the extension of the abscess.  [name] Uncontrolled type 2 diabetes mellitus with complication - new diagnosis, SSI, and diabetic education, for now will not initiate any long term medications as she is going to be NPO.  [name] HYPERTENSION - continue home medications,  stable . ASTHMA - continue advair and albuterol PRN   Prophylaxis: SCD, Protonix  CODE STATUS: FULL CODE  Other plan as per orders.  I have spent a total of 60 min on this admission time was taken to discuss her care with pharmacy for proper antibiotic coverage.   Jamayia Croker 10/20/2012, 11:40 PM

## 2012-10-20 NOTE — ED Notes (Signed)
Pt. went out front and said she was leaving.    She said she was tired of waiting. She fell asleep twice and woke up.  She was getting cold.  She said she has to ride the bus and it is getting dark.  I told her that she was next to be seen. She needs to have the packing removed.  She said she will go somewhere else. I thought I had talked her into coming back.  She said she was going to the BR and I thought she was coming back to the room. I was told by EMT that she left.

## 2012-10-20 NOTE — ED Notes (Signed)
Sent here from [facility] for further eval of abscess on buttock, sent here for iv antibiotics and possible ct scan/consult.

## 2012-10-20 NOTE — Progress Notes (Signed)
ANTIBIOTIC CONSULT NOTE - INITIAL  Pharmacy Consult for vancomycin Indication: cellulitis  Allergies  Allergen Reactions  . Codeine Nausea And Vomiting  . Doxycycline Itching and Nausea And Vomiting  . Penicillins Hives    Patient Measurements:    Body Weight: 103.9 kg  Vital Signs: Temp: 98.3 F (36.8 C) (03/29 1805) Temp src: Oral (03/29 1805) BP: 163/80 mmHg (03/29 1805) Pulse Rate: 96 (03/29 1805) Intake/Output from previous day:   Intake/Output from this shift:    Labs:  Recent Labs  10/20/12 2055  WBC 12.8*  HGB 13.0  PLT 399  CREATININE 0.85   The CrCl is unknown because both a height and weight (above a minimum accepted value) are required for this calculation. No results found for this basename: VANCOTROUGH, VANCOPEAK, VANCORANDOM, GENTTROUGH, GENTPEAK, GENTRANDOM, TOBRATROUGH, TOBRAPEAK, TOBRARND, AMIKACINPEAK, AMIKACINTROU, AMIKACIN,  in the last 72 hours   Microbiology: No results found for this or any previous visit (from the past 720 hour(s)).  Medical History: Past Medical History  Diagnosis Date  . Thumb pain     right  . Arthritis     right anke/foot  . Allergic rhinitis   . Asthma   . HTN (hypertension)   . GERD (gastroesophageal reflux disease)   . Tendinitis   . Knee pain   . Bursitis   . Carpal tunnel syndrome     Medications:   (Not in a hospital admission) Assessment: 62 yo lady to start vancomycin for cellulitis.  CrCl ~ 70 ml/min.  Goal of Therapy:  Vancomycin trough level 10-15 mcg/ml  Plan:  Vancomycin 2 gm IV X 1 then 1 gm IV q12 hours. F/u cultures, clinical course and renal function.  [employer] 10/20/2012,10:29 PM

## 2012-10-21 ENCOUNTER — Inpatient Hospital Stay (HOSPITAL_COMMUNITY): Payer: No Typology Code available for payment source

## 2012-10-21 ENCOUNTER — Encounter (HOSPITAL_COMMUNITY): Payer: Self-pay | Admitting: Radiology

## 2012-10-21 DIAGNOSIS — R609 Edema, unspecified: Secondary | ICD-10-CM

## 2012-10-21 DIAGNOSIS — J45909 Unspecified asthma, uncomplicated: Secondary | ICD-10-CM

## 2012-10-21 DIAGNOSIS — L03317 Cellulitis of buttock: Secondary | ICD-10-CM

## 2012-10-21 DIAGNOSIS — IMO0002 Reserved for concepts with insufficient information to code with codable children: Secondary | ICD-10-CM

## 2012-10-21 DIAGNOSIS — I10 Essential (primary) hypertension: Secondary | ICD-10-CM

## 2012-10-21 DIAGNOSIS — E1165 Type 2 diabetes mellitus with hyperglycemia: Secondary | ICD-10-CM

## 2012-10-21 DIAGNOSIS — K219 Gastro-esophageal reflux disease without esophagitis: Secondary | ICD-10-CM

## 2012-10-21 DIAGNOSIS — L0231 Cutaneous abscess of buttock: Secondary | ICD-10-CM

## 2012-10-21 LAB — CBC
HCT: 35.8 % — ABNORMAL LOW (ref 36.0–46.0)
Hemoglobin: 11.7 g/dL — ABNORMAL LOW (ref 12.0–15.0)
MCH: 29.5 pg (ref 26.0–34.0)
MCHC: 32.7 g/dL (ref 30.0–36.0)
MCV: 90.4 fL (ref 78.0–100.0)
RBC: 3.96 MIL/uL (ref 3.87–5.11)

## 2012-10-21 LAB — COMPREHENSIVE METABOLIC PANEL
ALT: 14 U/L (ref 0–35)
AST: 15 U/L (ref 0–37)
Albumin: 2.8 g/dL — ABNORMAL LOW (ref 3.5–5.2)
CO2: 25 mEq/L (ref 19–32)
Calcium: 8.7 mg/dL (ref 8.4–10.5)
Creatinine, Ser: 0.82 mg/dL (ref 0.50–1.10)
GFR calc non Af Amer: 78 mL/min — ABNORMAL LOW (ref >90)
Sodium: 138 mEq/L (ref 135–145)
Total Protein: 7.9 g/dL (ref 6.0–8.3)

## 2012-10-21 LAB — GLUCOSE, CAPILLARY
Glucose-Capillary: 234 mg/dL — ABNORMAL HIGH (ref 70–99)
Glucose-Capillary: 200 mg/dL — ABNORMAL HIGH (ref 70–99)
Glucose-Capillary: 197 mg/dL — ABNORMAL HIGH (ref 70–99)

## 2012-10-21 LAB — PHOSPHORUS: Phosphorus: 3.4 mg/dL (ref 2.3–4.6)

## 2012-10-21 MED ORDER — AMLODIPINE BESYLATE 5 MG PO TABS
5.0000 mg | ORAL_TABLET | Freq: Every day | ORAL | Status: DC
  Administered 2012-10-21: 5 mg via ORAL
  Filled 2012-10-21: qty 1

## 2012-10-21 MED ORDER — ACETAMINOPHEN 325 MG PO TABS: 650.0000 mg | ORAL_TABLET | Freq: Four times a day (QID) | ORAL | Status: DC | PRN

## 2012-10-21 MED ORDER — MORPHINE SULFATE 2 MG/ML IJ SOLN: 2.0000 mg | INTRAMUSCULAR | Status: DC | PRN

## 2012-10-21 MED ORDER — INSULIN ASPART 100 UNIT/ML ~~LOC~~ SOLN
0.0000 [IU] | Freq: Three times a day (TID) | SUBCUTANEOUS | Status: DC
  Administered 2012-10-21: 3 [IU] via SUBCUTANEOUS
  Administered 2012-10-21: 5 [IU] via SUBCUTANEOUS
  Administered 2012-10-22: 3 [IU] via SUBCUTANEOUS
  Administered 2012-10-22: 5 [IU] via SUBCUTANEOUS
  Administered 2012-10-22: 8 [IU] via SUBCUTANEOUS
  Administered 2012-10-23 (×2): 5 [IU] via SUBCUTANEOUS

## 2012-10-21 MED ORDER — ONDANSETRON HCL 4 MG PO TABS: 4.0000 mg | ORAL_TABLET | Freq: Four times a day (QID) | ORAL | Status: DC | PRN

## 2012-10-21 MED ORDER — CIPROFLOXACIN IN D5W 400 MG/200ML IV SOLN
400.0000 mg | Freq: Two times a day (BID) | INTRAVENOUS | Status: DC
  Administered 2012-10-21 – 2012-10-22 (×3): 400 mg via INTRAVENOUS
  Filled 2012-10-21 (×4): qty 200

## 2012-10-21 MED ORDER — CLONIDINE HCL 0.2 MG PO TABS
0.2000 mg | ORAL_TABLET | Freq: Two times a day (BID) | ORAL | Status: DC
  Administered 2012-10-21 – 2012-10-22 (×4): 0.2 mg via ORAL
  Filled 2012-10-21 (×4): qty 1

## 2012-10-21 MED ORDER — TRAMADOL HCL 50 MG PO TABS: 50.0000 mg | ORAL_TABLET | Freq: Four times a day (QID) | ORAL | Status: DC | PRN

## 2012-10-21 MED ORDER — IOHEXOL 300 MG/ML  SOLN
100.0000 mL | Freq: Once | INTRAMUSCULAR | Status: AC | PRN
  Administered 2012-10-21: 100 mL via INTRAVENOUS

## 2012-10-21 MED ORDER — HYDROCODONE-ACETAMINOPHEN 5-325 MG PO TABS: 1.0000 | ORAL_TABLET | ORAL | Status: DC | PRN

## 2012-10-21 MED ORDER — ACETAMINOPHEN 650 MG RE SUPP: 650.0000 mg | Freq: Four times a day (QID) | RECTAL | Status: DC | PRN

## 2012-10-21 MED ORDER — PANTOPRAZOLE SODIUM 40 MG PO TBEC
40.0000 mg | DELAYED_RELEASE_TABLET | Freq: Every day | ORAL | Status: DC
  Administered 2012-10-21 – 2012-10-23 (×3): 40 mg via ORAL
  Filled 2012-10-21 (×3): qty 1

## 2012-10-21 MED ORDER — INSULIN ASPART 100 UNIT/ML ~~LOC~~ SOLN
0.0000 [IU] | Freq: Every day | SUBCUTANEOUS | Status: DC
  Administered 2012-10-22: 2 [IU] via SUBCUTANEOUS

## 2012-10-21 MED ORDER — AMLODIPINE BESYLATE 10 MG PO TABS
10.0000 mg | ORAL_TABLET | Freq: Every day | ORAL | Status: DC
  Administered 2012-10-21 – 2012-10-22 (×2): 10 mg via ORAL
  Filled 2012-10-21 (×3): qty 1

## 2012-10-21 MED ORDER — COLLAGENASE 250 UNIT/GM EX OINT
TOPICAL_OINTMENT | Freq: Every day | CUTANEOUS | Status: DC
  Administered 2012-10-21 – 2012-10-23 (×3): via TOPICAL
  Filled 2012-10-21 (×2): qty 30

## 2012-10-21 MED ORDER — ALBUTEROL SULFATE HFA 108 (90 BASE) MCG/ACT IN AERS: 1.0000 | INHALATION_SPRAY | Freq: Four times a day (QID) | RESPIRATORY_TRACT | Status: DC | PRN

## 2012-10-21 MED ORDER — INSULIN ASPART 100 UNIT/ML ~~LOC~~ SOLN
0.0000 [IU] | SUBCUTANEOUS | Status: DC
  Administered 2012-10-21: 3 [IU] via SUBCUTANEOUS

## 2012-10-21 MED ORDER — METRONIDAZOLE IN NACL 5-0.79 MG/ML-% IV SOLN
500.0000 mg | Freq: Three times a day (TID) | INTRAVENOUS | Status: DC
  Administered 2012-10-21 – 2012-10-23 (×7): 500 mg via INTRAVENOUS
  Filled 2012-10-21 (×9): qty 100

## 2012-10-21 MED ORDER — MOMETASONE FURO-FORMOTEROL FUM 100-5 MCG/ACT IN AERO
2.0000 | INHALATION_SPRAY | Freq: Two times a day (BID) | RESPIRATORY_TRACT | Status: DC
  Administered 2012-10-21 – 2012-10-23 (×4): 2 via RESPIRATORY_TRACT
  Filled 2012-10-21: qty 8.8

## 2012-10-21 MED ORDER — ONDANSETRON HCL 4 MG/2ML IJ SOLN: 4.0000 mg | Freq: Four times a day (QID) | INTRAMUSCULAR | Status: DC | PRN

## 2012-10-21 MED ORDER — DOCUSATE SODIUM 100 MG PO CAPS
100.0000 mg | ORAL_CAPSULE | Freq: Two times a day (BID) | ORAL | Status: DC
  Administered 2012-10-21 (×2): 100 mg via ORAL
  Filled 2012-10-21 (×2): qty 1

## 2012-10-21 MED ORDER — LIVING WELL WITH DIABETES BOOK
Freq: Once | Status: AC
  Administered 2012-10-21: 05:00:00
  Filled 2012-10-21: qty 1

## 2012-10-21 NOTE — Consult Note (Signed)
Reason for Consult:Left buttock abscess Referring Physician: Zniya Cottone is an 62 y.o. female.  HPI: This is a pleasant female who presented several days ago to an urgent care where she was found to have a perirectal abscess. Incision and drainage was performed. She presents to the emergency department this evening with increasing discomfort and swelling. She denies fevers or chills.  Past Medical History  Diagnosis Date  . Thumb pain     right  . Arthritis     right anke/foot  . Allergic rhinitis   . Asthma   . HTN (hypertension)   . GERD (gastroesophageal reflux disease)   . Tendinitis   . Knee pain   . Bursitis   . Carpal tunnel syndrome   . Diabetes mellitus without complication     Past Surgical History  Procedure Laterality Date  . Uterine fibroid surgery  1988    Dr Randell Patient  . Repeat fibroid excision  1997    Dr Randell Patient  . Arthroscopic left knee surgery  1992  . Right heel surgery on plantar facitis  1995  . Wrist surgery      Family History  Problem Relation Age of Onset  . Uterine cancer Mother   . Hypertension Mother   . Hypertension Father     Social History:  reports that she has never smoked. She has never used smokeless tobacco. She reports that she does not drink alcohol or use illicit drugs.  Allergies:  Allergies  Allergen Reactions  . Codeine Nausea And Vomiting  . Doxycycline Itching and Nausea And Vomiting  . Penicillins Hives    Medications: I have reviewed the patient's current medications.  Results for orders placed during the hospital encounter of 10/20/12 (from the past 48 hour(s))  CBC WITH DIFFERENTIAL     Status: Abnormal   Collection Time    10/20/12  8:55 PM      Result Value Range   WBC 12.8 (*) 4.0 - 10.5 K/uL   RBC 4.22  3.87 - 5.11 MIL/uL   Hemoglobin 13.0  12.0 - 15.0 g/dL   HCT 16.1  09.6 - 04.5 %   MCV 90.8  78.0 - 100.0 fL   MCH 30.8  26.0 - 34.0 pg   MCHC 33.9  30.0 - 36.0 g/dL   RDW 40.9  81.1 - 91.4 %   Platelets 399  150 - 400 K/uL   Neutrophils Relative 68  43 - 77 %   Neutro Abs 8.7 (*) 1.7 - 7.7 K/uL   Lymphocytes Relative 24  12 - 46 %   Lymphs Abs 3.0  0.7 - 4.0 K/uL   Monocytes Relative 7  3 - 12 %   Monocytes Absolute 0.8  0.1 - 1.0 K/uL   Eosinophils Relative 2  0 - 5 %   Eosinophils Absolute 0.2  0.0 - 0.7 K/uL   Basophils Relative 0  0 - 1 %   Basophils Absolute 0.0  0.0 - 0.1 K/uL  BASIC METABOLIC PANEL     Status: Abnormal   Collection Time    10/20/12  8:55 PM      Result Value Range   Sodium 134 (*) 135 - 145 mEq/L   Potassium 4.0  3.5 - 5.1 mEq/L   Comment: SLIGHT HEMOLYSIS   Chloride 97  96 - 112 mEq/L   CO2 25  19 - 32 mEq/L   Glucose, Bld 268 (*) 70 - 99 mg/dL   BUN 12  6 -  23 mg/dL   Creatinine, Ser 0.98  0.50 - 1.10 mg/dL   Calcium 9.0  8.4 - 11.9 mg/dL   GFR calc non Af Amer 75 (*) >90 mL/min   GFR calc Af Amer 86 (*) >90 mL/min   Comment:            The eGFR has been calculated     using the CKD EPI equation.     This calculation has not been     validated in all clinical     situations.     eGFR's persistently     <90 mL/min signify     possible Chronic Kidney Disease.    Ct Pelvis W Contrast  10/21/2012  *RADIOLOGY REPORT*  Clinical Data:  Left buttock infection question abscess  CT PELVIS WITH CONTRAST  Technique:  Multidetector CT imaging of the pelvis was performed using the standard protocol following the bolus administration of intravenous contrast.  Sagittal and coronal MPR images reconstructed from axial data set.  Contrast: OMNIPAQUE IOHEXOL 300 MG/ML  SOLN  No oral contrast administered.  Comparison:  None Correlation:  Pelvic ultrasound 04/18/2008  Findings: Enlarged multinodular uterus with numerous leiomyomata, some partially calcified. Central low attenuation within uterus may represent a thickened endometrial complex or endometrial fluid. Mild displacement of the bladder to the right. Bowel loops normal appearance. Scattered  normal-sized mesenteric lymph nodes. Numerous inguinal lymph nodes bilaterally, normal to minimally prominent size.  No intrapelvic mass, adenopathy or free fluid otherwise seen. Subcutaneous infiltrative changes and skin thickening at the medial left buttock. A small focus of soft tissue gas is seen at the medial left buttock compatible with abscess or wound, 1.9 cm greatest diameter. No discrete drainable fluid collection identified. Symmetric pelvic muscular planes. No acute osseous findings.  IMPRESSION: Small gas collection at medial left buttock compatible with wound or tiny abscess cavity. No discrete drainable fluid collection is identified. Probable reactive inguinal lymph nodes bilaterally. Enlarged uterus containing multiple leiomyomata as well as central low attenuation which could represent endometrial fluid or thickened endometrial complex, abnormal for a post menopausal patient. This was not well delineated on a prior pelvic ultrasound from 2009 due to uterine size and body habitus and would therefore consider follow-up non emergent MR imaging of the pelvis to characterize.   Original Report Authenticated By: Ulyses Southward, M.D.     Review of Systems  All other systems reviewed and are negative.   Blood pressure 163/80, pulse 96, temperature 98.3 F (36.8 C), temperature source Oral, resp. rate 18, last menstrual period 09/05/2012, SpO2 98.00%. Physical Exam  Constitutional: She is oriented to person, place, and time. She appears well-developed and well-nourished. No distress.  Cardiovascular: Normal rate, regular rhythm, normal heart sounds and intact distal pulses.   Respiratory: Effort normal and breath sounds normal.  GI: Soft. She exhibits no distension. There is no tenderness.  Genitourinary:  There is a 1-1/2 cm left buttock wound near the perianal area. There is eschar on the top which I removed. There is no underlying purulence  Neurological: She is alert and oriented to person,  place, and time.  Skin: Skin is warm and dry.  Psychiatric: Her behavior is normal. Judgment normal.    Assessment/Plan: Left buttock abscess with wound  Dressing changes will be started. If this does not improve quickly, she may need debridement in the operating room.   Annick Dimaio A 10/21/2012, 1:03 AM

## 2012-10-21 NOTE — ED Provider Notes (Signed)
Medical screening examination/treatment/procedure(s) were performed by resident physician or non-physician practitioner and as supervising physician I was immediately available for consultation/collaboration.   Roselind Klus DOUGLAS MD.   Serria Sloma D Graviel Payeur, MD 10/21/12 1106 

## 2012-10-21 NOTE — ED Provider Notes (Signed)
History     CSN: [id number]  Arrival date & time 10/20/12  1756   First MD Initiated Contact with Patient 10/20/12 2006      Chief Complaint  Patient presents with  . Abscess    (Consider location/radiation/quality/duration/timing/severity/associated sxs/prior treatment) HPI Comments: Patient comes to the ER for evaluation of left buttock abscess. Patient was seen in urgent care 2 days ago and had incision and drainage of a buttock abscess. Patient reports that the packing fell out the next day. She was seen again in urgent care today for followup. He indicated that the wound looked worse and she has significant amount of swelling drainage, referred to the ER for further evaluation. Patient reports moderate to severe pain in the area. She has not been noticing any fever.  Patient is a 62 y.o. female presenting with abscess.  Abscess Associated symptoms: no fever     Past Medical History  Diagnosis Date  . Thumb pain     right  . Arthritis     right anke/foot  . Allergic rhinitis   . Asthma   . HTN (hypertension)   . GERD (gastroesophageal reflux disease)   . Tendinitis   . Knee pain   . Bursitis   . Carpal tunnel syndrome   . Diabetes mellitus without complication     Past Surgical History  Procedure Laterality Date  . Uterine fibroid surgery  1988    Dr Randell Patient  . Repeat fibroid excision  1997    Dr Randell Patient  . Arthroscopic left knee surgery  1992  . Right heel surgery on plantar facitis  1995  . Wrist surgery      Family History  Problem Relation Age of Onset  . Uterine cancer Mother   . Hypertension Mother   . Hypertension Father     History  Substance Use Topics  . Smoking status: Never Smoker   . Smokeless tobacco: Never Used  . Alcohol Use: No    OB History   Grav Para Term Preterm Abortions TAB SAB Ect Mult Living                  Review of Systems  Constitutional: Negative for fever.  Skin: Positive for wound.  All other systems reviewed and  are negative.    Allergies  Codeine; Doxycycline; and Penicillins  Home Medications  No current outpatient prescriptions on file.  BP 139/70  Pulse 75  Temp(Src) 98.6 F (37 C) (Oral)  Resp 18  Ht 5\' 1"  (1.549 m)  Wt 240 lb 14.4 oz (109.272 kg)  BMI 45.54 kg/m2  SpO2 96%  LMP 09/05/2012  Physical Exam  Constitutional: She is oriented to person, place, and time. She appears well-developed and well-nourished. No distress.  HENT:  Head: Normocephalic and atraumatic.  Right Ear: Hearing normal.  Nose: Nose normal.  Mouth/Throat: Oropharynx is clear and moist and mucous membranes are normal.  Eyes: Conjunctivae and EOM are normal. Pupils are equal, round, and reactive to light.  Neck: Normal range of motion. Neck supple.  Cardiovascular: Normal rate, regular rhythm, S1 normal and S2 normal.  Exam reveals no gallop and no friction rub.   No murmur heard. Pulmonary/Chest: Effort normal and breath sounds normal. No respiratory distress. She exhibits no tenderness.  Abdominal: Soft. Normal appearance and bowel sounds are normal. There is no hepatosplenomegaly. There is no tenderness. There is no rebound, no guarding, no tenderness at McBurney's point and negative Murphy's sign. No hernia.  Musculoskeletal: Normal  range of motion.  Neurological: She is alert and oriented to person, place, and time. She has normal strength. No cranial nerve deficit or sensory deficit. Coordination normal. GCS eye subscore is 4. GCS verbal subscore is 5. GCS motor subscore is 6.  Skin: Skin is warm, dry and intact. No cyanosis.     Psychiatric: She has a normal mood and affect. Her speech is normal and behavior is normal. Thought content normal.    ED Course  Procedures (including critical care time)  Labs Reviewed  CBC WITH DIFFERENTIAL - Abnormal; Notable for the following:    WBC 12.8 (*)    Neutro Abs 8.7 (*)    All other components within normal limits  BASIC METABOLIC PANEL - Abnormal;  Notable for the following:    Sodium 134 (*)    Glucose, Bld 268 (*)    GFR calc non Af Amer 75 (*)    GFR calc Af Amer 86 (*)    All other components within normal limits  COMPREHENSIVE METABOLIC PANEL - Abnormal; Notable for the following:    Potassium 3.3 (*)    Glucose, Bld 229 (*)    Albumin 2.8 (*)    Alkaline Phosphatase 136 (*)    Total Bilirubin 0.2 (*)    GFR calc non Af Amer 78 (*)    All other components within normal limits  CBC - Abnormal; Notable for the following:    WBC 12.5 (*)    Hemoglobin 11.7 (*)    HCT 35.8 (*)    All other components within normal limits  GLUCOSE, CAPILLARY - Abnormal; Notable for the following:    Glucose-Capillary 241 (*)    All other components within normal limits  GLUCOSE, CAPILLARY - Abnormal; Notable for the following:    Glucose-Capillary 158 (*)    All other components within normal limits  GLUCOSE, CAPILLARY - Abnormal; Notable for the following:    Glucose-Capillary 234 (*)    All other components within normal limits  WOUND CULTURE  CULTURE, BLOOD (ROUTINE X 2)  CULTURE, BLOOD (ROUTINE X 2)  MAGNESIUM  PHOSPHORUS  HEMOGLOBIN A1C  TSH   Ct Pelvis W Contrast  10/21/2012  *RADIOLOGY REPORT*  Clinical Data:  Left buttock infection question abscess  CT PELVIS WITH CONTRAST  Technique:  Multidetector CT imaging of the pelvis was performed using the standard protocol following the bolus administration of intravenous contrast.  Sagittal and coronal MPR images reconstructed from axial data set.  Contrast: OMNIPAQUE IOHEXOL 300 MG/ML  SOLN  No oral contrast administered.  Comparison:  None Correlation:  Pelvic ultrasound 04/18/2008  Findings: Enlarged multinodular uterus with numerous leiomyomata, some partially calcified. Central low attenuation within uterus may represent a thickened endometrial complex or endometrial fluid. Mild displacement of the bladder to the right. Bowel loops normal appearance. Scattered normal-sized  mesenteric lymph nodes. Numerous inguinal lymph nodes bilaterally, normal to minimally prominent size.  No intrapelvic mass, adenopathy or free fluid otherwise seen. Subcutaneous infiltrative changes and skin thickening at the medial left buttock. A small focus of soft tissue gas is seen at the medial left buttock compatible with abscess or wound, 1.9 cm greatest diameter. No discrete drainable fluid collection identified. Symmetric pelvic muscular planes. No acute osseous findings.  IMPRESSION: Small gas collection at medial left buttock compatible with wound or tiny abscess cavity. No discrete drainable fluid collection is identified. Probable reactive inguinal lymph nodes bilaterally. Enlarged uterus containing multiple leiomyomata as well as central low attenuation which  could represent endometrial fluid or thickened endometrial complex, abnormal for a post menopausal patient. This was not well delineated on a prior pelvic ultrasound from 2009 due to uterine size and body habitus and would therefore consider follow-up non emergent MR imaging of the pelvis to characterize.   Original Report Authenticated By: Ulyses Southward, M.D.      1. Left buttock abscess   2. Uncontrolled type 2 diabetes mellitus with complication   3. Unspecified asthma   4. Unspecified essential hypertension   5. Edema   6. Esophageal reflux   7. Morbid obesity       MDM  As to the ER for evaluation of left buttock abscess. Patient has previously had this drained at urgent care. Area appears to be worsening rather than improving. There continues to be foul-smelling drainage and the area surrounding a drainage site is very erythematous and indurated. Patient's glucose is also elevated, indicating a new diagnosis of diabetes. CAT scan shows a small amount of gas is compatible with a previously drained abscess. There is no drainable fluid currently. He should will require hospitalization for further management IV antibiotics. I did  discuss this with Dr. Magnus Ivan, visual to consult on by general surgery. Patient to be admitted by hospitalist service.        Gilda Crease, MD 10/21/12 [phone]

## 2012-10-21 NOTE — Progress Notes (Signed)
Patient ID: Mariah [name]  female  QIO:[id number]    DOB: 09/03/1953    DOA: 10/20/2012  PCP: Yisroel Ramming, MD  Assessment/Plan: Principal Problem:   Left buttock abscess - appreciate surgery consult (Dr Magnus Ivan), recommended dressing changes - on vancomycin, Flagyl and ciprofloxacin - If no significant improvement, will need I & D in the OR  Active Problems:   HYPERTENSION -increased and Norvasc 10 mg daily, DC IV fluids, continue clonidine    ASTHMA- stable, continued Dulera, and albuterol PRN    GASTROESOPHAGEAL REFLUX DISEASE - cont protonix    Uncontrolled type 2 diabetes mellitus - place on carb modified diet, place on sliding scale insulin - obtain hemoglobin A1c - the patient states that she's not aware that she is diabetic    Morbid obesity -counseled patient on weight and diet control  DVT Prophylaxis: SCD's  Code Status: FC  Disposition:    Subjective: Hungry, wants food  Objective: Weight change:   Intake/Output Summary (Last 24 hours) at 10/21/12 1051 Last data filed at 10/21/12 0640  Gross per 24 hour  Intake    948 ml  Output    300 ml  Net    648 ml   Blood pressure 168/90, pulse 93, temperature 98.8 F (37.1 C), temperature source Oral, resp. rate 16, height 5\' 1"  (1.549 m), weight 109.272 kg (240 lb 14.4 oz), last menstrual period 09/05/2012, SpO2 95.00%.  Physical Exam: General: Alert and awake, oriented x3, not in any acute distress. HEENT: anicteric sclera, pupils reactive to light and accommodation, EOMI CVS: S1-S2 clear, no murmur rubs or gallops Chest: clear to auscultation bilaterally, no wheezing, rales or rhonchi Abdomen:morbidly obese soft nontender, nondistended, normal bowel sounds Small area of ulceration clean about 3 cm, no pus or discharge currently, on the left buttock Extremities: no cyanosis, clubbing or edema noted bilaterally   Lab Results: Basic Metabolic Panel:  Recent Labs Lab 10/20/12 2055 10/21/12 0535   NA 134* 138  K 4.0 3.3*  CL 97 100  CO2 25 25  GLUCOSE 268* 229*  BUN 12 10  CREATININE 0.85 0.82  CALCIUM 9.0 8.7  MG  --  2.0  PHOS  --  3.4   Liver Function Tests:  Recent Labs Lab 10/21/12 0535  AST 15  ALT 14  ALKPHOS 136*  BILITOT 0.2*  PROT 7.9  ALBUMIN 2.8*   CBC:  Recent Labs Lab 10/20/12 2055 10/21/12 0535  WBC 12.8* 12.5*  NEUTROABS 8.7*  --   HGB 13.0 11.7*  HCT 38.3 35.8*  MCV 90.8 90.4  PLT 399 385   Cardiac Enzymes: No results found for this basename: CKTOTAL, CKMB, CKMBINDEX, TROPONINI,  in the last 168 hours BNP: No components found with this basename: POCBNP,  CBG:  Recent Labs Lab 10/21/12 0345 10/21/12 0751  GLUCAP 241* 158*     Micro Results: Recent Results (from the past 240 hour(s))  WOUND CULTURE     Status: None   Collection Time    10/20/12  9:30 PM      Result Value Range Status   Specimen Description WOUND LEFT BUTTOCKS   Final   Special Requests NONE   Final   Gram Stain PENDING   Incomplete   Culture Culture reincubated for better growth   Final   Report Status PENDING   Incomplete    Studies/Results: Ct Pelvis W Contrast  10/21/2012  *RADIOLOGY REPORT*  Clinical Data:  Left buttock infection question abscess  CT PELVIS WITH CONTRAST  Technique:  Multidetector CT imaging of the pelvis was performed using the standard protocol following the bolus administration of intravenous contrast.  Sagittal and coronal MPR images reconstructed from axial data set.  Contrast: OMNIPAQUE IOHEXOL 300 MG/ML  SOLN  No oral contrast administered.  Comparison:  None Correlation:  Pelvic ultrasound 04/18/2008  Findings: Enlarged multinodular uterus with numerous leiomyomata, some partially calcified. Central low attenuation within uterus may represent a thickened endometrial complex or endometrial fluid. Mild displacement of the bladder to the right. Bowel loops normal appearance. Scattered normal-sized mesenteric lymph nodes. Numerous  inguinal lymph nodes bilaterally, normal to minimally prominent size.  No intrapelvic mass, adenopathy or free fluid otherwise seen. Subcutaneous infiltrative changes and skin thickening at the medial left buttock. A small focus of soft tissue gas is seen at the medial left buttock compatible with abscess or wound, 1.9 cm greatest diameter. No discrete drainable fluid collection identified. Symmetric pelvic muscular planes. No acute osseous findings.  IMPRESSION: Small gas collection at medial left buttock compatible with wound or tiny abscess cavity. No discrete drainable fluid collection is identified. Probable reactive inguinal lymph nodes bilaterally. Enlarged uterus containing multiple leiomyomata as well as central low attenuation which could represent endometrial fluid or thickened endometrial complex, abnormal for a post menopausal patient. This was not well delineated on a prior pelvic ultrasound from 2009 due to uterine size and body habitus and would therefore consider follow-up non emergent MR imaging of the pelvis to characterize.   Original Report Authenticated By: Ulyses Southward, M.D.     Medications: Scheduled Meds: . amLODipine  5 mg Oral QHS  . ciprofloxacin  400 mg Intravenous Q12H  . cloNIDine  0.2 mg Oral BID  . collagenase   Topical Daily  . docusate sodium  100 mg Oral BID  . insulin aspart  0-15 Units Subcutaneous TID WC  . insulin aspart  0-5 Units Subcutaneous QHS  . metronidazole  500 mg Intravenous Q8H  . mometasone-formoterol  2 puff Inhalation BID  . pantoprazole  40 mg Oral Daily  . vancomycin  1,000 mg Intravenous Q12H      LOS: 1 day   Taje Littler M.D. [facility] 10/21/2012, 10:51 AM Pager: [phone]  If 7PM-7AM, please contact night-coverage [email or web address] Password [email or web address]

## 2012-10-21 NOTE — ED Notes (Signed)
Pt returned from CT. Surgeon at bedside

## 2012-10-22 LAB — GLUCOSE, CAPILLARY: Glucose-Capillary: 238 mg/dL — ABNORMAL HIGH (ref 70–99)

## 2012-10-22 MED ORDER — INSULIN GLARGINE 100 UNIT/ML ~~LOC~~ SOLN
10.0000 [IU] | Freq: Every day | SUBCUTANEOUS | Status: DC
  Administered 2012-10-22: 10 [IU] via SUBCUTANEOUS
  Filled 2012-10-22 (×2): qty 0.1

## 2012-10-22 MED ORDER — POTASSIUM CHLORIDE CRYS ER 20 MEQ PO TBCR
40.0000 meq | EXTENDED_RELEASE_TABLET | Freq: Once | ORAL | Status: AC
  Administered 2012-10-22: 40 meq via ORAL
  Filled 2012-10-22: qty 2

## 2012-10-22 MED ORDER — LIVING WELL WITH DIABETES BOOK
Freq: Once | Status: AC
  Administered 2012-10-22: 17:00:00
  Filled 2012-10-22: qty 1

## 2012-10-22 MED ORDER — CIPROFLOXACIN HCL 500 MG PO TABS
500.0000 mg | ORAL_TABLET | Freq: Two times a day (BID) | ORAL | Status: DC
  Administered 2012-10-22 – 2012-10-23 (×2): 500 mg via ORAL
  Filled 2012-10-22 (×4): qty 1

## 2012-10-22 MED ORDER — LORATADINE 10 MG PO TABS
10.0000 mg | ORAL_TABLET | Freq: Every day | ORAL | Status: DC
  Administered 2012-10-22 – 2012-10-23 (×2): 10 mg via ORAL
  Filled 2012-10-22 (×2): qty 1

## 2012-10-22 MED ORDER — FLUTICASONE PROPIONATE 50 MCG/ACT NA SUSP
2.0000 | Freq: Every day | NASAL | Status: DC
  Administered 2012-10-22 – 2012-10-23 (×2): 2 via NASAL
  Filled 2012-10-22: qty 16

## 2012-10-22 MED ORDER — ALBUTEROL SULFATE (5 MG/ML) 0.5% IN NEBU
2.5000 mg | INHALATION_SOLUTION | Freq: Four times a day (QID) | RESPIRATORY_TRACT | Status: DC
  Administered 2012-10-22 – 2012-10-23 (×4): 2.5 mg via RESPIRATORY_TRACT
  Filled 2012-10-22 (×5): qty 0.5

## 2012-10-22 MED ORDER — TRAMADOL HCL 50 MG PO TABS: 50.0000 mg | ORAL_TABLET | Freq: Four times a day (QID) | ORAL | Status: DC | PRN

## 2012-10-22 MED ORDER — ACETAMINOPHEN 325 MG PO TABS
650.0000 mg | ORAL_TABLET | Freq: Three times a day (TID) | ORAL | Status: DC
  Administered 2012-10-22 – 2012-10-23 (×4): 650 mg via ORAL
  Filled 2012-10-22 (×5): qty 2

## 2012-10-22 MED ORDER — IPRATROPIUM BROMIDE 0.02 % IN SOLN
0.5000 mg | Freq: Four times a day (QID) | RESPIRATORY_TRACT | Status: DC
  Administered 2012-10-22 – 2012-10-23 (×4): 0.5 mg via RESPIRATORY_TRACT
  Filled 2012-10-22 (×5): qty 2.5

## 2012-10-22 MED ORDER — CLONIDINE HCL 0.2 MG PO TABS
0.2000 mg | ORAL_TABLET | Freq: Three times a day (TID) | ORAL | Status: DC
  Administered 2012-10-22 – 2012-10-23 (×3): 0.2 mg via ORAL
  Filled 2012-10-22 (×4): qty 1

## 2012-10-22 MED ORDER — INSULIN ASPART 100 UNIT/ML ~~LOC~~ SOLN
3.0000 [IU] | Freq: Three times a day (TID) | SUBCUTANEOUS | Status: DC
  Administered 2012-10-22 – 2012-10-23 (×3): 3 [IU] via SUBCUTANEOUS

## 2012-10-22 NOTE — Progress Notes (Signed)
Patient ID: Mariah [name]  female  ZOX:[id number]    DOB: 09/03/1953    DOA: 10/20/2012  PCP: Yisroel Ramming, MD  Assessment/Plan: Principal Problem:   Left buttock abscess - appreciate surgery following, recommended dressing changes which started yesterday - examined with surgery today, recommended sitz bath 3 times a day and after BMs, moist dressings - on vancomycin, Flagyl and ciprofloxacin-> recommended Bactrim and ciprofloxacin for 5 days  Active Problems:   HYPERTENSION- uncontrolled -increased Norvasc and, clonidine    ASTHMA- with acute exacerbation today, wheezing - placed on scheduled nebulizers treatments, continued Dulera    GASTROESOPHAGEAL REFLUX DISEASE - cont protonix    Uncontrolled type 2 diabetes mellitus: hemoglobin A1c 9.0, patient states that she's not aware that she is diabetic and it is something she brought on herself by "drinking sodas" - she needs extensive diabetic teaching, inpatient and outpatient - Discussed with diabetic coordinator to assess if patient has preference for oral hypoglycemics versus insulin, flex pens vs vial  - placed on basal insulin while inpatient, meal coverage with SSI    Morbid obesity -counseled patient on weight and diet control  DVT Prophylaxis: SCD's  Code Status: FC  Disposition: tomorrow AM    Subjective: Wheezing, does not want any stool softeners  Objective: Weight change:   Intake/Output Summary (Last 24 hours) at 10/22/12 1435 Last data filed at 10/22/12 0858  Gross per 24 hour  Intake    360 ml  Output      0 ml  Net    360 ml   Blood pressure 140/72, pulse 89, temperature 98.6 F (37 C), temperature source Oral, resp. rate 20, height 5\' 1"  (1.549 m), weight 109.272 kg (240 lb 14.4 oz), last menstrual period 09/05/2012, SpO2 98.00%.  Physical Exam: General: Alert and awake, oriented x3, not in any acute distress. CVS: S1-S2 clear, no murmur rubs or gallops Chest:bilateral expiratory  wheezing Abdomen:morbidly obese soft nontender, nondistended, normal bowel sounds Small area of wound, appears clean today, no pus or discharge currently, on the left buttock Extremities: no cyanosis, clubbing or edema noted bilaterally   Lab Results: Basic Metabolic Panel:  Recent Labs Lab 10/20/12 2055 10/21/12 0535  NA 134* 138  K 4.0 3.3*  CL 97 100  CO2 25 25  GLUCOSE 268* 229*  BUN 12 10  CREATININE 0.85 0.82  CALCIUM 9.0 8.7  MG  --  2.0  PHOS  --  3.4   Liver Function Tests:  Recent Labs Lab 10/21/12 0535  AST 15  ALT 14  ALKPHOS 136*  BILITOT 0.2*  PROT 7.9  ALBUMIN 2.8*   CBC:  Recent Labs Lab 10/20/12 2055 10/21/12 0535  WBC 12.8* 12.5*  NEUTROABS 8.7*  --   HGB 13.0 11.7*  HCT 38.3 35.8*  MCV 90.8 90.4  PLT 399 385   Cardiac Enzymes: No results found for this basename: CKTOTAL, CKMB, CKMBINDEX, TROPONINI,  in the last 168 hours BNP: No components found with this basename: POCBNP,  CBG:  Recent Labs Lab 10/21/12 1221 10/21/12 1730 10/21/12 2109 10/22/12 0812 10/22/12 1214  GLUCAP 234* 200* 197* 173* 271*     Micro Results: Recent Results (from the past 240 hour(s))  CULTURE, BLOOD (ROUTINE X 2)     Status: None   Collection Time    10/20/12  9:10 PM      Result Value Range Status   Specimen Description BLOOD LEFT ARM   Final   Special Requests BOTTLES DRAWN AEROBIC AND ANAEROBIC  [facility]   Final   Culture  Setup Time 10/21/2012 01:45   Final   Culture     Final   Value:        BLOOD CULTURE RECEIVED NO GROWTH TO DATE CULTURE WILL BE HELD FOR 5 DAYS BEFORE ISSUING A FINAL NEGATIVE REPORT   Report Status PENDING   Incomplete  CULTURE, BLOOD (ROUTINE X 2)     Status: None   Collection Time    10/20/12  9:15 PM      Result Value Range Status   Specimen Description BLOOD LEFT HAND   Final   Special Requests BOTTLES DRAWN AEROBIC ONLY 7CC   Final   Culture  Setup Time 10/21/2012 01:45   Final   Culture     Final   Value:         BLOOD CULTURE RECEIVED NO GROWTH TO DATE CULTURE WILL BE HELD FOR 5 DAYS BEFORE ISSUING A FINAL NEGATIVE REPORT   Report Status PENDING   Incomplete  WOUND CULTURE     Status: None   Collection Time    10/20/12  9:30 PM      Result Value Range Status   Specimen Description WOUND LEFT BUTTOCKS   Final   Special Requests NONE   Final   Gram Stain     Final   Value: NO WBC SEEN     NO SQUAMOUS EPITHELIAL CELLS SEEN     ABUNDANT GRAM NEGATIVE RODS     ABUNDANT GRAM POSITIVE COCCI IN PAIRS     FEW GRAM POSITIVE RODS   Culture MULTIPLE ORGANISMS PRESENT, NONE PREDOMINANT   Final   Report Status PENDING   Incomplete    Studies/Results: Ct Pelvis W Contrast  10/21/2012  *RADIOLOGY REPORT*  Clinical Data:  Left buttock infection question abscess  CT PELVIS WITH CONTRAST  Technique:  Multidetector CT imaging of the pelvis was performed using the standard protocol following the bolus administration of intravenous contrast.  Sagittal and coronal MPR images reconstructed from axial data set.  Contrast: OMNIPAQUE IOHEXOL 300 MG/ML  SOLN  No oral contrast administered.  Comparison:  None Correlation:  Pelvic ultrasound 04/18/2008  Findings: Enlarged multinodular uterus with numerous leiomyomata, some partially calcified. Central low attenuation within uterus may represent a thickened endometrial complex or endometrial fluid. Mild displacement of the bladder to the right. Bowel loops normal appearance. Scattered normal-sized mesenteric lymph nodes. Numerous inguinal lymph nodes bilaterally, normal to minimally prominent size.  No intrapelvic mass, adenopathy or free fluid otherwise seen. Subcutaneous infiltrative changes and skin thickening at the medial left buttock. A small focus of soft tissue gas is seen at the medial left buttock compatible with abscess or wound, 1.9 cm greatest diameter. No discrete drainable fluid collection identified. Symmetric pelvic muscular planes. No acute osseous findings.   IMPRESSION: Small gas collection at medial left buttock compatible with wound or tiny abscess cavity. No discrete drainable fluid collection is identified. Probable reactive inguinal lymph nodes bilaterally. Enlarged uterus containing multiple leiomyomata as well as central low attenuation which could represent endometrial fluid or thickened endometrial complex, abnormal for a post menopausal patient. This was not well delineated on a prior pelvic ultrasound from 2009 due to uterine size and body habitus and would therefore consider follow-up non emergent MR imaging of the pelvis to characterize.   Original Report Authenticated By: Ulyses Southward, M.D.     Medications: Scheduled Meds: . acetaminophen  650 mg Oral TID PC & HS  .  albuterol  2.5 mg Nebulization Q6H  . amLODipine  10 mg Oral QHS  . ciprofloxacin  500 mg Oral BID  . cloNIDine  0.2 mg Oral TID  . collagenase   Topical Daily  . fluticasone  2 spray Each Nare Daily  . insulin aspart  0-15 Units Subcutaneous TID WC  . insulin aspart  0-5 Units Subcutaneous QHS  . ipratropium  0.5 mg Nebulization Q6H  . living well with diabetes book   Does not apply Once  . loratadine  10 mg Oral Daily  . metronidazole  500 mg Intravenous Q8H  . mometasone-formoterol  2 puff Inhalation BID  . pantoprazole  40 mg Oral Daily  . vancomycin  1,000 mg Intravenous Q12H      LOS: 2 days   RAI,RIPUDEEP M.D. [facility] 10/22/2012, 2:35 PM Pager: [phone]  If 7PM-7AM, please contact night-coverage [email or web address] Password [email or web address]

## 2012-10-22 NOTE — Progress Notes (Signed)
Probably can go home on Bactrim & Cipro for 5 days Agree w anticipate d/c tomorrow from surgery standpoint

## 2012-10-22 NOTE — Progress Notes (Signed)
Inpatient Diabetes Program Recommendations  AACE/ADA: New Consensus Statement on Inpatient Glycemic Control (2013)  Target Ranges:  Prepandial:   less than 140 mg/dL      Peak postprandial:   less than 180 mg/dL (1-2 hours)      Critically ill patients:  140 - 180 mg/dL   Reason for Visit: consult for "new onset dm" Spoke with patient at length regarding her visit with Dr. Standley Dakins this past Feb. When she was tested with a HgbA1C which was 8.6% at the time. However, pt states she never got that word from the doctor nor was she given a prescription for Metformin as Dr Laural Benes intended. At this point, I would not recommend insulin to attempt to control her diabetes. Her living situation is financially scarce.  Her only method of transportation now is by bus. Pt agreed to start Metformin (I explained to her what it does and how it works) and is willing to start at 500 mg once a day, and slowly increase to 500 bid, then 1000 bid over a span of 3-4 weeks.  Pt can bel followed by Dr. Laural Benes at the adult health care clinic/Urgent Care since she has been there before. However, I am not sure she will go back due to the wait time. Have ordered all ed'l materials and have asked that nursing assist her with watching the ed'l videos and have her practice checking her blood sugar with the floor meter.  She states she thinks she can get help with a glucose meter thru a friend.  Otherwise, she can get the [employer]ion Meter and strips from [employer]. Pt states she has no money to buy anything..  Have ordered dietician consult and exit care notes. Thank you, Lenor Coffin, RN, CNS, Diabetes Coordinator [phone])

## 2012-10-22 NOTE — Progress Notes (Signed)
Patient ID: Mariah [name], female   DOB: 09/03/1953, 62 y.o.   MRN: [id number]    Subjective: Pain better, no problems with wound otherwise, just got "washed up" and just had a BM  Objective: Vital signs in last 24 hours: Temp:  [97.6 F (36.4 C)-98.7 F (37.1 C)] 98.1 F (36.7 C) (03/31 0944) Pulse Rate:  [75-95] 95 (03/31 0944) Resp:  [16-18] 18 (03/31 0944) BP: (130-163)/(69-81) 163/81 mmHg (03/31 0944) SpO2:  [96 %-100 %] 96 % (03/31 0944) Last BM Date: 10/21/12  Intake/Output from previous day:   Intake/Output this shift: Total I/O In: 360 [P.O.:360] Out: -   PE: Buttocks: left medial buttocks with quarter sized wound that is only about .5cm deep in center otherwise superficial, no surrounding erythema, min serous looking drainage, no significant tenderness  Lab Results:   Recent Labs  10/20/12 2055 10/21/12 0535  WBC 12.8* 12.5*  HGB 13.0 11.7*  HCT 38.3 35.8*  PLT 399 385   BMET  Recent Labs  10/20/12 2055 10/21/12 0535  NA 134* 138  K 4.0 3.3*  CL 97 100  CO2 25 25  GLUCOSE 268* 229*  BUN 12 10  CREATININE 0.85 0.82  CALCIUM 9.0 8.7   PT/INR No results found for this basename: LABPROT, INR,  in the last 72 hours CMP     Component Value Date/Time   NA 138 10/21/2012 0535   K 3.3* 10/21/2012 0535   CL 100 10/21/2012 0535   CO2 25 10/21/2012 0535   GLUCOSE 229* 10/21/2012 0535   BUN 10 10/21/2012 0535   CREATININE 0.82 10/21/2012 0535   CALCIUM 8.7 10/21/2012 0535   PROT 7.9 10/21/2012 0535   ALBUMIN 2.8* 10/21/2012 0535   AST 15 10/21/2012 0535   ALT 14 10/21/2012 0535   ALKPHOS 136* 10/21/2012 0535   BILITOT 0.2* 10/21/2012 0535   GFRNONAA 78* 10/21/2012 0535   GFRAA >90 10/21/2012 0535   Lipase  No results found for this basename: lipase       Studies/Results: Ct Pelvis W Contrast  10/21/2012  *RADIOLOGY REPORT*  Clinical Data:  Left buttock infection question abscess  CT PELVIS WITH CONTRAST  Technique:  Multidetector CT imaging of the  pelvis was performed using the standard protocol following the bolus administration of intravenous contrast.  Sagittal and coronal MPR images reconstructed from axial data set.  Contrast: OMNIPAQUE IOHEXOL 300 MG/ML  SOLN  No oral contrast administered.  Comparison:  None Correlation:  Pelvic ultrasound 04/18/2008  Findings: Enlarged multinodular uterus with numerous leiomyomata, some partially calcified. Central low attenuation within uterus may represent a thickened endometrial complex or endometrial fluid. Mild displacement of the bladder to the right. Bowel loops normal appearance. Scattered normal-sized mesenteric lymph nodes. Numerous inguinal lymph nodes bilaterally, normal to minimally prominent size.  No intrapelvic mass, adenopathy or free fluid otherwise seen. Subcutaneous infiltrative changes and skin thickening at the medial left buttock. A small focus of soft tissue gas is seen at the medial left buttock compatible with abscess or wound, 1.9 cm greatest diameter. No discrete drainable fluid collection identified. Symmetric pelvic muscular planes. No acute osseous findings.  IMPRESSION: Small gas collection at medial left buttock compatible with wound or tiny abscess cavity. No discrete drainable fluid collection is identified. Probable reactive inguinal lymph nodes bilaterally. Enlarged uterus containing multiple leiomyomata as well as central low attenuation which could represent endometrial fluid or thickened endometrial complex, abnormal for a post menopausal patient. This was not well delineated on  a prior pelvic ultrasound from 2009 due to uterine size and body habitus and would therefore consider follow-up non emergent MR imaging of the pelvis to characterize.   Original Report Authenticated By: Mariah [name], M.D.     Anti-infectives: Anti-infectives   Start     Dose/Rate Route Frequency Ordered Stop   10/21/12 1000  vancomycin (VANCOCIN) IVPB 1000 mg/200 mL premix     1,000 mg 200  mL/hr over 60 Minutes Intravenous Every 12 hours 10/20/12 2233     10/21/12 0400  ciprofloxacin (CIPRO) IVPB 400 mg     400 mg 200 mL/hr over 60 Minutes Intravenous Every 12 hours 10/21/12 0210     10/21/12 0400  metroNIDAZOLE (FLAGYL) IVPB 500 mg     500 mg 100 mL/hr over 60 Minutes Intravenous Every 8 hours 10/21/12 0210     10/20/12 2345  ertapenem (INVANZ) 1 g in sodium chloride 0.9 % 50 mL IVPB  Status:  Discontinued     1 g 100 mL/hr over 30 Minutes Intravenous Every 24 hours 10/20/12 2335 10/21/12 0210   10/20/12 2245  vancomycin (VANCOCIN) 2,000 mg in sodium chloride 0.9 % 500 mL IVPB     2,000 mg 250 mL/hr over 120 Minutes Intravenous  Once 10/20/12 2230 10/21/12 0124       Assessment/Plan 1. Left Buttocks abscess: looks really good actually, not that deep, will have her start doing sitz bathes tid and after BMs, can leave a moist dressing there for comfort, likely doesn't need homehealth assistance, pt to stay today and will be discharged tomorrow.  We will recheck in the AM.   LOS: 2 days    Mariah [name] 10/22/2012

## 2012-10-22 NOTE — Progress Notes (Addendum)
Inpatient Diabetes Program Recommendations  AACE/ADA: New Consensus Statement on Inpatient Glycemic Control (2013)  Target Ranges:  Prepandial:   less than 140 mg/dL      Peak postprandial:   less than 180 mg/dL (1-2 hours)      Critically ill patients:  140 - 180 mg/dL  Cbg's are running into 200 range.  Please consider either increasing correction scale to q 4hrs or increase scale to resisant tidwc. Or add novolog meal coverage tidwc of 3-4 units.  Pt eating 100% Thank you, Lenor Coffin, RN, CNS, Diabetes Coordinator [phone])

## 2012-10-23 ENCOUNTER — Telehealth (INDEPENDENT_AMBULATORY_CARE_PROVIDER_SITE_OTHER): Payer: Self-pay | Admitting: *Deleted

## 2012-10-23 DIAGNOSIS — J209 Acute bronchitis, unspecified: Secondary | ICD-10-CM

## 2012-10-23 LAB — HEMOGLOBIN A1C: Mean Plasma Glucose: 212 mg/dL — ABNORMAL HIGH (ref <117)

## 2012-10-23 LAB — TSH: TSH: 3.261 u[IU]/mL (ref 0.350–4.500)

## 2012-10-23 MED ORDER — SULFAMETHOXAZOLE-TRIMETHOPRIM 800-160 MG PO TABS: 1.0000 | ORAL_TABLET | Freq: Two times a day (BID) | ORAL | Status: DC

## 2012-10-23 MED ORDER — COLLAGENASE 250 UNIT/GM EX OINT: TOPICAL_OINTMENT | Freq: Every day | CUTANEOUS | Status: DC

## 2012-10-23 MED ORDER — AMLODIPINE BESYLATE 5 MG PO TABS: 10.0000 mg | ORAL_TABLET | Freq: Every day | ORAL | Status: DC

## 2012-10-23 MED ORDER — CLONIDINE HCL 0.2 MG PO TABS: 0.2000 mg | ORAL_TABLET | Freq: Three times a day (TID) | ORAL | Status: DC

## 2012-10-23 MED ORDER — METFORMIN HCL 500 MG PO TABS: 500.0000 mg | ORAL_TABLET | Freq: Every day | ORAL | Status: DC

## 2012-10-23 MED ORDER — CIPROFLOXACIN HCL 500 MG PO TABS: 500.0000 mg | ORAL_TABLET | Freq: Two times a day (BID) | ORAL | Status: DC

## 2012-10-23 NOTE — Progress Notes (Signed)
Patient ID: Mariah [name], female   DOB: 09/03/1953, 62 y.o.   MRN: [id number]    Subjective: Much better today, minimal pain, ready to go home  Objective: Vital signs in last 24 hours: Temp:  [97.3 F (36.3 C)-98.6 F (37 C)] 98.3 F (36.8 C) (04/01 0520) Pulse Rate:  [79-95] 79 (04/01 0520) Resp:  [18-21] 18 (04/01 0520) BP: (120-163)/(58-81) 160/80 mmHg (04/01 0520) SpO2:  [95 %-100 %] 100 % (04/01 0736) Last BM Date: 10/21/12  Intake/Output from previous day: 03/31 0701 - 04/01 0700 In: 360 [P.O.:360] Out: -  Intake/Output this shift:    PE: Buttocks: left medial buttocks with quarter sized wound that is only about .5cm deep in center otherwise superficial, no surrounding erythema, min serous looking drainage, no significant tenderness  Lab Results:   Recent Labs  10/20/12 2055 10/21/12 0535  WBC 12.8* 12.5*  HGB 13.0 11.7*  HCT 38.3 35.8*  PLT 399 385   BMET  Recent Labs  10/20/12 2055 10/21/12 0535  NA 134* 138  K 4.0 3.3*  CL 97 100  CO2 25 25  GLUCOSE 268* 229*  BUN 12 10  CREATININE 0.85 0.82  CALCIUM 9.0 8.7   PT/INR No results found for this basename: LABPROT, INR,  in the last 72 hours CMP     Component Value Date/Time   NA 138 10/21/2012 0535   K 3.3* 10/21/2012 0535   CL 100 10/21/2012 0535   CO2 25 10/21/2012 0535   GLUCOSE 229* 10/21/2012 0535   BUN 10 10/21/2012 0535   CREATININE 0.82 10/21/2012 0535   CALCIUM 8.7 10/21/2012 0535   PROT 7.9 10/21/2012 0535   ALBUMIN 2.8* 10/21/2012 0535   AST 15 10/21/2012 0535   ALT 14 10/21/2012 0535   ALKPHOS 136* 10/21/2012 0535   BILITOT 0.2* 10/21/2012 0535   GFRNONAA 78* 10/21/2012 0535   GFRAA >90 10/21/2012 0535   Lipase  No results found for this basename: lipase       Studies/Results: No results found.  Anti-infectives: Anti-infectives   Start     Dose/Rate Route Frequency Ordered Stop   10/22/12 2000  ciprofloxacin (CIPRO) tablet 500 mg     500 mg Oral 2 times daily 10/22/12 1357      10/21/12 1000  vancomycin (VANCOCIN) IVPB 1000 mg/200 mL premix     1,000 mg 200 mL/hr over 60 Minutes Intravenous Every 12 hours 10/20/12 2233     10/21/12 0400  ciprofloxacin (CIPRO) IVPB 400 mg  Status:  Discontinued     400 mg 200 mL/hr over 60 Minutes Intravenous Every 12 hours 10/21/12 0210 10/22/12 1357   10/21/12 0400  metroNIDAZOLE (FLAGYL) IVPB 500 mg     500 mg 100 mL/hr over 60 Minutes Intravenous Every 8 hours 10/21/12 0210     10/20/12 2345  ertapenem (INVANZ) 1 g in sodium chloride 0.9 % 50 mL IVPB  Status:  Discontinued     1 g 100 mL/hr over 30 Minutes Intravenous Every 24 hours 10/20/12 2335 10/21/12 0210   10/20/12 2245  vancomycin (VANCOCIN) 2,000 mg in sodium chloride 0.9 % 500 mL IVPB     2,000 mg 250 mL/hr over 120 Minutes Intravenous  Once 10/20/12 2230 10/21/12 0124       Assessment/Plan 1. Left Buttocks abscess: ready to discharge from surgical standpoint, continue wound care, follow up with [place] in 2 weeks or sooner if needed.    LOS: 3 days    Nolen Lindamood 10/23/2012

## 2012-10-23 NOTE — Progress Notes (Signed)
Outpatient wound care for f/u

## 2012-10-23 NOTE — Care Management Note (Signed)
  Page 1 of 1   10/24/2012     3:07:38 PM   CARE MANAGEMENT NOTE 10/24/2012  Patient:  Mariah [name], Mariah [name]   Account Number:  [id number]  Date Initiated:  10/23/2012  Documentation initiated by:  Ronny Flurry  Subjective/Objective Assessment:     Action/Plan:   Anticipated DC Date:     Anticipated DC Plan:           Choice offered to / List presented to:             Status of service:   Medicare Important Message given?   (If response is "NO", the following Medicare IM given date fields will be blank) Date Medicare IM given:   Date Additional Medicare IM given:    Discharge Disposition:    Per UR Regulation:    If discussed at Long Length of Stay Meetings, dates discussed:    Comments:  10-24-12 Patient called back today stated she has no assistance with prescriptions . Entered in the [facility] program . Patient willing to go to [facility] . Spoke with Crystal at [facility] , [facility] letter faxed. Patient understands she will have $3 co pay per prescription .  Ronny Flurry RN BSN [phone]   10-23-12 Patient already discharged . Called patient she does not want HHRN for dressing changes , states she has 2 people to help her and they all know how to do it. Also states she has a "purple card " to assist in getting medications  Ronny Flurry RN BSN [phone]

## 2012-10-23 NOTE — Telephone Encounter (Signed)
Called and spoke to patient regarding PO appt setup for 11/06/12 at 1145a with [facility] per [facility]

## 2012-10-23 NOTE — Progress Notes (Signed)
ANTIBIOTIC CONSULT NOTE - FOLLOW UP  Pharmacy Consult:  Vancomycin Indication:  Cellulitis with buttock abscess  Allergies  Allergen Reactions  . Codeine Nausea And Vomiting  . Doxycycline Itching and Nausea And Vomiting  . Penicillins Hives    Patient Measurements: Height: 5\' 1"  (154.9 cm) Weight: 240 lb 14.4 oz (109.272 kg) IBW/kg (Calculated) : 47.8  Vital Signs: Temp: 98.3 F (36.8 C) (04/01 0520) Temp src: Oral (04/01 0520) BP: 160/80 mmHg (04/01 0520) Pulse Rate: 79 (04/01 0520) Intake/Output from previous day: 03/31 0701 - 04/01 0700 In: 360 [P.O.:360] Out: -   Labs:  Recent Labs  10/20/12 2055 10/21/12 0535  WBC 12.8* 12.5*  HGB 13.0 11.7*  PLT 399 385  CREATININE 0.85 0.82   Estimated Creatinine Clearance: 86.5 ml/min (by C-G formula based on Cr of 0.82). No results found for this basename: VANCOTROUGH, VANCOPEAK, VANCORANDOM, GENTTROUGH, GENTPEAK, GENTRANDOM, TOBRATROUGH, TOBRAPEAK, TOBRARND, AMIKACINPEAK, AMIKACINTROU, AMIKACIN,  in the last 72 hours   Microbiology: Recent Results (from the past 720 hour(s))  CULTURE, BLOOD (ROUTINE X 2)     Status: None   Collection Time    10/20/12  9:10 PM      Result Value Range Status   Specimen Description BLOOD LEFT ARM   Final   Special Requests BOTTLES DRAWN AEROBIC AND ANAEROBIC 7CC EA   Final   Culture  Setup Time 10/21/2012 01:45   Final   Culture     Final   Value:        BLOOD CULTURE RECEIVED NO GROWTH TO DATE CULTURE WILL BE HELD FOR 5 DAYS BEFORE ISSUING A FINAL NEGATIVE REPORT   Report Status PENDING   Incomplete  CULTURE, BLOOD (ROUTINE X 2)     Status: None   Collection Time    10/20/12  9:15 PM      Result Value Range Status   Specimen Description BLOOD LEFT HAND   Final   Special Requests BOTTLES DRAWN AEROBIC ONLY 7CC   Final   Culture  Setup Time 10/21/2012 01:45   Final   Culture     Final   Value:        BLOOD CULTURE RECEIVED NO GROWTH TO DATE CULTURE WILL BE HELD FOR 5 DAYS BEFORE  ISSUING A FINAL NEGATIVE REPORT   Report Status PENDING   Incomplete  WOUND CULTURE     Status: None   Collection Time    10/20/12  9:30 PM      Result Value Range Status   Specimen Description WOUND LEFT BUTTOCKS   Final   Special Requests NONE   Final   Gram Stain     Final   Value: NO WBC SEEN     NO SQUAMOUS EPITHELIAL CELLS SEEN     ABUNDANT GRAM NEGATIVE RODS     ABUNDANT GRAM POSITIVE COCCI IN PAIRS     FEW GRAM POSITIVE RODS   Culture MULTIPLE ORGANISMS PRESENT, NONE PREDOMINANT   Final   Report Status PENDING   Incomplete         Assessment: 10 YOF who presented several days ago to an urgent care where she was found to have a perirectal abscess.  I&D performed and she presented to the ED with increasing discomfort and swelling.  Vancomycin, Cipro, and Flagyl started as empiric therapy.  Her renal function is stable.  Cipro 3/30 >> Vanc 3/30 >> Flagyl 3/30 >>  3/29 left buttock wound cx - GPC/GNR/GPR on Gram stain 3/29 Blood x 2 -  NGTD   Goal of Therapy:  Vancomycin trough level 10-15 mcg/ml   Plan:  - Vanc 1gm IV Q12H - Continue Cipro and Flagyl as ordered - Will hold off on checking a vanc trough as MD plans to change abx to Bactrim and Cipro - BMET in AM      Balbina Depace D. Laney Potash, PharmD, BCPS Pager:  [phone] 10/23/2012, 7:32 AM

## 2012-10-23 NOTE — Discharge Summary (Signed)
Physician Discharge Summary  Patient ID: ZOLA RUNION MRN: [id number] DOB/AGE: 60/03/1954 60 y.o.  Admit date: 10/20/2012 Discharge date: 10/23/2012  Primary Care Physician:  Yisroel Ramming, MD  Discharge Diagnoses:    . Left buttock abscess s/p I&D 10/21/2012 . 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 HYPERTENSION . ASTHMA with mild acute exacerbation  . GASTROESOPHAGEAL REFLUX DISEASE . Morbid obesity  Consults: General surgery  Discharge Medications:   Medication List    TAKE these medications       albuterol 108 (90 BASE) MCG/ACT inhaler  Commonly known as:  PROVENTIL HFA;VENTOLIN HFA  Inhale 1-2 puffs into the lungs every 6 (six) hours as needed for wheezing.     amLODipine 5 MG tablet  Commonly known as:  NORVASC  Take 2 tablets (10 mg total) by mouth at bedtime.     celecoxib 200 MG capsule  Commonly known as:  CELEBREX  Take 200 mg by mouth daily.     ciprofloxacin 500 MG tablet  Commonly known as:  CIPRO  Take 1 tablet (500 mg total) by mouth 2 (two) times daily. X 5 DAYS     cloNIDine 0.2 MG tablet  Commonly known as:  CATAPRES  Take 1 tablet (0.2 mg total) by mouth 3 (three) times daily.     collagenase ointment  Commonly known as:  SANTYL  Apply topically daily. Apply to left buttock wound     fluticasone 50 MCG/ACT nasal spray  Commonly known as:  FLONASE  Place 2 sprays into the nose daily.     Fluticasone-Salmeterol 100-50 MCG/DOSE Aepb  Commonly known as:  ADVAIR DISKUS  Inhale 1 puff into the lungs every 12 (twelve) hours.     furosemide 40 MG tablet  Commonly known as:  LASIX  Take 40 mg by mouth daily.     loratadine 10 MG tablet  Commonly known as:  CLARITIN  Take 10 mg by mouth daily.     metFORMIN 500 MG tablet  Commonly known as:  GLUCOPHAGE  Take 1 tablet (500 mg total) by mouth daily with breakfast.     RABEprazole 20 MG tablet  Commonly known as:  ACIPHEX  Take 20 mg by mouth daily.      sulfamethoxazole-trimethoprim 800-160 MG per tablet  Commonly known as:  SEPTRA DS  Take 1 tablet by mouth 2 (two) times daily. X5 days     traMADol 50 MG tablet  Commonly known as:  ULTRAM  Take 1 tablet (50 mg total) by mouth every 6 (six) hours as needed for pain.         Brief H and P: For complete details please refer to admission H and P, but in brief patient is a 62 year old female with past medical history of asthma, hypertension, GERD, diabetes (patient herself was unaware of it) presented with left buttock pain which started 1 week ago. She had tried warm compresses. 3 days ago was it was lanced and packed at urgent care and she was placed on Bactrim. Patient was supposed to have the packing removed but given the severity of the swelling she was brought to the ED and started on IV antibiotics.   Hospital Course:  Left buttock abscess: Surgery was consulted in the ER and recommended medicine admission. Patient was recommended dressing changes, sitz bath TID. Patient was seen by Dr. Magnus Ivan and Dr. Michaell Cowing, the wound is significantly improved with medical management. Patient was initially placed on IV vancomycin, ciprofloxacin and  Flagyl. CCS recommended Bactrim and ciprofloxacin for 5 days and f/u on 11/06/12   HYPERTENSION- uncontrolled,increased Norvasc and, clonidine   ASTHMA- with acute exacerbation today, wheezing, improved after placing on scheduled nebulizers treatments, continued Dulera   GASTROESOPHAGEAL REFLUX DISEASE - cont protonix   Uncontrolled type 2 diabetes mellitus: hemoglobin A1c 9.0, patient was unaware of her diagnosis Diabetic coordinator consult was obtained for extensive diabetic teaching, inpatient and outpatient. Patient referred to have oral hypoglycemic due to lack of funds. She was covered with insulin while inpatient. She was started on metformin and recommended to keep a log of blood sugars for dose adjustment by Dr. Laural Benes. Patient reported that  she will have the blood sugars checked daily and the [facility] shelter. I also encouraged her to lose weight and also keep diabetes in check for better wound healing.     Morbid obesity -counseled patient on weight and diet control   Day of Discharge BP 160/80  Pulse 79  Temp(Src) 98.3 F (36.8 C) (Oral)  Resp 18  Ht 5\' 1"  (1.549 m)  Wt 109.272 kg (240 lb 14.4 oz)  BMI 45.54 kg/m2  SpO2 100%  LMP 09/05/2012  Physical Exam:  General: Ax O x3   CVS: S1-S2 clear Chest: wheezing resolved, CTAB Abdomen: morbidly obese, NT, ND, NBS  Small area of wound, appears clean, no pus or discharge, on the left buttock  Extremities: no c/c/e bilaterally  The results of significant diagnostics from this hospitalization (including imaging, microbiology, ancillary and laboratory) are listed below for reference.    LAB RESULTS: Basic Metabolic Panel:  Recent Labs Lab 10/20/12 2055 10/21/12 0535  NA 134* 138  K 4.0 3.3*  CL 97 100  CO2 25 25  GLUCOSE 268* 229*  BUN 12 10  CREATININE 0.85 0.82  CALCIUM 9.0 8.7  MG  --  2.0  PHOS  --  3.4   Liver Function Tests:  Recent Labs Lab 10/21/12 0535  AST 15  ALT 14  ALKPHOS 136*  BILITOT 0.2*  PROT 7.9  ALBUMIN 2.8*   No results found for this basename: LIPASE, AMYLASE,  in the last 168 hours No results found for this basename: AMMONIA,  in the last 168 hours CBC:  Recent Labs Lab 10/20/12 2055 10/21/12 0535  WBC 12.8* 12.5*  NEUTROABS 8.7*  --   HGB 13.0 11.7*  HCT 38.3 35.8*  MCV 90.8 90.4  PLT 399 385   Cardiac Enzymes: No results found for this basename: CKTOTAL, CKMB, CKMBINDEX, TROPONINI,  in the last 168 hours BNP: No components found with this basename: POCBNP,  CBG:  Recent Labs Lab 10/22/12 2137 10/23/12 0821  GLUCAP 213* 208*    Significant Diagnostic Studies:  Ct Pelvis W Contrast  10/21/2012  *RADIOLOGY REPORT*  Clinical Data:  Left buttock infection question abscess  CT PELVIS WITH CONTRAST  Technique:   Multidetector CT imaging of the pelvis was performed using the standard protocol following the bolus administration of intravenous contrast.  Sagittal and coronal MPR images reconstructed from axial data set.  Contrast: OMNIPAQUE IOHEXOL 300 MG/ML  SOLN  No oral contrast administered.  Comparison:  None Correlation:  Pelvic ultrasound 04/18/2008  Findings: Enlarged multinodular uterus with numerous leiomyomata, some partially calcified. Central low attenuation within uterus may represent a thickened endometrial complex or endometrial fluid. Mild displacement of the bladder to the right. Bowel loops normal appearance. Scattered normal-sized mesenteric lymph nodes. Numerous inguinal lymph nodes bilaterally, normal to minimally prominent size.  No  intrapelvic mass, adenopathy or free fluid otherwise seen. Subcutaneous infiltrative changes and skin thickening at the medial left buttock. A small focus of soft tissue gas is seen at the medial left buttock compatible with abscess or wound, 1.9 cm greatest diameter. No discrete drainable fluid collection identified. Symmetric pelvic muscular planes. No acute osseous findings.  IMPRESSION: Small gas collection at medial left buttock compatible with wound or tiny abscess cavity. No discrete drainable fluid collection is identified. Probable reactive inguinal lymph nodes bilaterally. Enlarged uterus containing multiple leiomyomata as well as central low attenuation which could represent endometrial fluid or thickened endometrial complex, abnormal for a post menopausal patient. This was not well delineated on a prior pelvic ultrasound from 2009 due to uterine size and body habitus and would therefore consider follow-up non emergent MR imaging of the pelvis to characterize.   Original Report Authenticated By: Ulyses Southward, M.D.     Disposition and Follow-up: Discharge Orders   Future Appointments Provider Department Dept Phone   11/06/2012 11:45 AM [facility] Doc Of The Week  [facility], [place] [phone]   Future Orders Complete By Expires     Diet Carb Modified  As directed     Discharge instructions  As directed     Comments:      Please get your BS checked daily at the shelter daily and keep a log. Take that log to Dr Johnson/Urgent care so they can adjust your medication.    Discharge wound care:  As directed     Comments:      1)  Remove cover dressing & packing.  May moisten to help remove. 2).  Wash wound gently with soap & warm tap water 3).  Repack wound with lightly normal saline moistened gauze: use 4x4 gauze or Kling ~2 inch-wide  roll gauze   4) SITZ BATHS three times/day  5) Follow up with surgery on your appointment    Increase activity slowly  As directed         DISPOSITION: Home  DIET: Carb modified diet ACTIVITY: As tolerated  DISCHARGE FOLLOW-UP Follow-up Information   Follow up with Shelly Rubenstein, MD On 11/06/2012. (at 11:45 AM for hospital follow-up)    Contact information:   [address] [place] [address] [phone]       Follow up with Standley Dakins, MD. Schedule an appointment as soon as possible for a visit in 2 weeks. (please bring the log of blood sugar readings so your metformin dose can be adjusted)    Contact information:   [address] [place] [address] [phone]       Time spent on Discharge: 40 mins  Signed:   Berdine Rasmusson M.D. [facility] 10/23/2012, 10:18 AM Pager: [phone]

## 2012-10-24 LAB — WOUND CULTURE: Gram Stain: NONE SEEN

## 2012-10-25 ENCOUNTER — Telehealth (INDEPENDENT_AMBULATORY_CARE_PROVIDER_SITE_OTHER): Payer: Self-pay

## 2012-10-25 NOTE — Telephone Encounter (Signed)
The pt called to clarify if she has to take both antibiotics at the same time that she was prescribed.  After reviewing the chart, I told her yes she is and they are both for 5 days.

## 2012-10-27 LAB — CULTURE, BLOOD (ROUTINE X 2): Culture: NO GROWTH

## 2012-11-06 ENCOUNTER — Encounter (INDEPENDENT_AMBULATORY_CARE_PROVIDER_SITE_OTHER): Payer: Self-pay | Admitting: General Surgery

## 2012-11-06 ENCOUNTER — Ambulatory Visit (INDEPENDENT_AMBULATORY_CARE_PROVIDER_SITE_OTHER): Payer: Self-pay | Admitting: General Surgery

## 2012-11-06 VITALS — BP 150/82 | HR 89 | Temp 98.2°F | Resp 18 | Ht 61.0 in | Wt 234.0 lb

## 2012-11-06 DIAGNOSIS — L02212 Cutaneous abscess of back [any part, except buttock]: Secondary | ICD-10-CM

## 2012-11-06 DIAGNOSIS — L02219 Cutaneous abscess of trunk, unspecified: Secondary | ICD-10-CM

## 2012-11-06 NOTE — Patient Instructions (Signed)
Continue to clean site with sitz bath or shower, Keep a dressing in place.  You do not need santyl cream anymore.  Call us back if you have any concerns.

## 2012-11-06 NOTE — Progress Notes (Signed)
Post op visit;  . Left buttock abscess s/p I&D 10/21/2012 . 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 HYPERTENSION . ASTHMA with mild acute exacerbation  . GASTROESOPHAGEAL REFLUX DISEASE . Morbid obesity  Subjective: She is doing well, hard place to keep clean.  She lives alone and does not have much support.  Objective: Vital signs in last 24 hours: LMP 09/05/2012     Intake/Output from previous day:   Intake/Output this shift: @IOTHISSHIFT @  Skin: Skin color, texture, turgor normal. No rashes or lesions or 1.5 cm of granular tissue.  No purulence or necrosis.  it's just very close to being healed,  Still needs skin to cover.  Lab Results:  No results found for this basename: WBC, HGB, HCT, PLT,  in the last 72 hours  BMET No results found for this basename: NA, K, CL, CO2, GLUCOSE, BUN, CREATININE, CALCIUM,  in the last 72 hours PT/INR No results found for this basename: LABPROT, INR,  in the last 72 hours  No results found for this basename: AST, ALT, ALKPHOS, BILITOT, PROT, ALBUMIN,  in the last 168 hours   Lipase  No results found for this basename: lipase     Studies/Results: No results found.  Medications: Prior to Admission medications   Medication Sig Start Date End Date Taking? Authorizing Provider  albuterol (PROVENTIL HFA;VENTOLIN HFA) 108 (90 BASE) MCG/ACT inhaler Inhale 1-2 puffs into the lungs every 6 (six) hours as needed for wheezing. 04/13/12  Yes Tia Oliveri, PA-C  amLODipine (NORVASC) 5 MG tablet Take 2 tablets (10 mg total) by mouth at bedtime. 10/23/12  Yes Ripudeep Jenna Luo, MD  celecoxib (CELEBREX) 200 MG capsule Take 200 mg by mouth daily.   Yes Historical Provider, MD  cloNIDine (CATAPRES) 0.2 MG tablet Take 1 tablet (0.2 mg total) by mouth 3 (three) times daily. 10/23/12  Yes Ripudeep Jenna Luo, MD  collagenase (SANTYL) ointment Apply topically daily. Apply to left buttock wound 10/23/12  Yes Ripudeep K Rai, MD   fluticasone (FLONASE) 50 MCG/ACT nasal spray Place 2 sprays into the nose daily.   Yes Historical Provider, MD  Fluticasone-Salmeterol (ADVAIR DISKUS) 100-50 MCG/DOSE AEPB Inhale 1 puff into the lungs every 12 (twelve) hours. 09/15/11  Yes Oretha Milch, MD  furosemide (LASIX) 40 MG tablet Take 40 mg by mouth daily.    Yes Historical Provider, MD  loratadine (CLARITIN) 10 MG tablet Take 10 mg by mouth daily.   Yes Historical Provider, MD  metFORMIN (GLUCOPHAGE) 500 MG tablet Take 1 tablet (500 mg total) by mouth daily with breakfast. 10/23/12  Yes Ripudeep K Rai, MD  RABEprazole (ACIPHEX) 20 MG tablet Take 20 mg by mouth daily.    Yes Historical Provider, MD  traMADol (ULTRAM) 50 MG tablet Take 1 tablet (50 mg total) by mouth every 6 (six) hours as needed for pain. 04/25/12  Yes Trixie Dredge, PA-C      Assessment/Plan.pro . Left buttock abscess s/p I&D 10/21/2012 . 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 HYPERTENSION . ASTHMA with mild acute exacerbation  . GASTROESOPHAGEAL REFLUX DISEASE . Morbid obesity  Plan:  I have told her she can stop using santyl, and just continue sitz bath, or shower to keep clean with 2 x 2 in place.  Once she has reepithelialized this area she should be done.  She can come back PRN.  She is to follow up with her primary care for her hypertension/medical follow  up.     Sherrie George 11/06/2012

## 2012-12-31 ENCOUNTER — Ambulatory Visit (INDEPENDENT_AMBULATORY_CARE_PROVIDER_SITE_OTHER): Payer: PRIVATE HEALTH INSURANCE | Admitting: General Surgery

## 2012-12-31 ENCOUNTER — Encounter (INDEPENDENT_AMBULATORY_CARE_PROVIDER_SITE_OTHER): Payer: Self-pay | Admitting: General Surgery

## 2012-12-31 VITALS — BP 134/74 | HR 101 | Temp 96.9°F | Ht 61.0 in | Wt 239.0 lb

## 2012-12-31 DIAGNOSIS — T07XXXA Unspecified multiple injuries, initial encounter: Secondary | ICD-10-CM

## 2012-12-31 DIAGNOSIS — L98491 Non-pressure chronic ulcer of skin of other sites limited to breakdown of skin: Secondary | ICD-10-CM

## 2012-12-31 NOTE — Progress Notes (Signed)
Subjective:     Patient ID: Mariah [name], female   DOB: 02/10/1951, 62 y.o.   MRN: [id number]  HPI This patient comes in for evaluation of her left inner buttock wound. She had an incision and drainage about one month ago and has been doing dressing changes. She stopped the dressing changes but she started having some irritation in the area which was relieved with placement of antifungal cream. She came in today concerning that it might be getting infected again. She denies any fevers  Review of Systems     Objective:   Physical Exam The area of the left inner buttock wound is healing nicely she has a residual 3-4 mm area of granulation tissue but no evidence of cellulitis or induration or tenderness. I do not see any evidence of active infection.    Assessment:     Skin wound-healing nicely without evidence of an infection I do not think that this is infected and the wound should continue to heal. It is almost completely healed as is. I recommended that she keep the area clean and dry and follow up with [place] if this has not completely healed in the next few weeks or if she develops any increased tenderness pain or swelling or drainage or fevers     Plan:     Wound care with dry dressings and follow up when necessary

## 2013-01-03 ENCOUNTER — Other Ambulatory Visit (HOSPITAL_COMMUNITY): Payer: Self-pay | Admitting: Family Medicine

## 2013-01-03 DIAGNOSIS — Z1231 Encounter for screening mammogram for malignant neoplasm of breast: Secondary | ICD-10-CM

## 2013-01-08 ENCOUNTER — Ambulatory Visit (HOSPITAL_COMMUNITY): Payer: No Typology Code available for payment source

## 2013-01-11 ENCOUNTER — Ambulatory Visit (HOSPITAL_COMMUNITY): Payer: No Typology Code available for payment source | Attending: Family Medicine

## 2013-04-22 ENCOUNTER — Encounter: Payer: Self-pay | Admitting: Adult Health

## 2013-04-22 ENCOUNTER — Ambulatory Visit (INDEPENDENT_AMBULATORY_CARE_PROVIDER_SITE_OTHER): Payer: No Typology Code available for payment source | Admitting: Adult Health

## 2013-04-22 VITALS — BP 146/82 | HR 91 | Temp 98.7°F | Ht 59.0 in | Wt 239.2 lb

## 2013-04-22 DIAGNOSIS — J45909 Unspecified asthma, uncomplicated: Secondary | ICD-10-CM

## 2013-04-22 MED ORDER — FUROSEMIDE 40 MG PO TABS: 40.0000 mg | ORAL_TABLET | Freq: Every day | ORAL | Status: DC

## 2013-04-22 MED ORDER — PREDNISONE 10 MG PO TABS: ORAL_TABLET | ORAL | Status: DC

## 2013-04-22 NOTE — Patient Instructions (Addendum)
Mucinex DM Twice daily  As needed  Cough congestion  Increase Advair 250/50 1 puff until sample is gone and then back to 100/50 Twice daily   Please Take Furosemide daily.  Prednisone taper over next week.  Follow up Dr. Vassie Loll  In 6 weeks and As needed   Please contact office for sooner follow up if symptoms do not improve or worsen or seek emergency care

## 2013-04-22 NOTE — Progress Notes (Signed)
  Subjective:    Patient ID: Mariah [name], female    DOB: [birth year], 62 y.o.   MRN: [id number]  HPI  62 year old AAF patient with persistent Asthma well controlled on advair - when compliant.  02/19/08-- Presents for for 2 week of cough, congestion- yellow thick mucus and wheezing. Ran out of advair. 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  ? compliance with advair due to social reasons >>cont on Advair.  04/22/2013 Acute OV  Complains of asthma flare.  Complains over last 2 weeks with occasional rattling and tightness, wheezing . Coughing up thick white much.  Currently applying for Disability and Medicaid.  Has orange card gets Advair.  Having lots of stress at home due to finances , was denied disability.  She is currently appealing decision. Does not feel she can stand for prolonged time due to joint pains and leg swelling.  No hemoptysis, chest pain , orthopnea, or fever.  Did not take lasix today.  [place]-but has hard time affording rent.  Discussed if not able to get rx thru orange card program to call back to send paper work to med assistance .    Review of Systems  Patient denies significant hemoptysis,  chest pain, palpitations, orthopnea, paroxysmal nocturnal dyspnea, lightheadedness, nausea, vomiting, abdominal or  leg pains      Objective:   Physical Exam  Gen. Pleasant,  well-nourished-obese , in no distress ENT - no lesions, no post nasal drip Neck: No JVD, no thyromegaly, no carotid bruits Lungs: no use of accessory muscles, no dullness to percussion, clear without rales or rhonchi  Cardiovascular: Rhythm regular, heart sounds  normal, no murmurs or gallops, 1-+ peripheral edema Musculoskeletal: No deformities, no cyanosis or clubbing         Assessment & Plan:

## 2013-04-23 ENCOUNTER — Encounter (INDEPENDENT_AMBULATORY_CARE_PROVIDER_SITE_OTHER): Payer: Self-pay

## 2013-04-23 NOTE — Assessment & Plan Note (Signed)
Mild flare   Plan  Mucinex DM Twice daily  As needed  Cough congestion  Increase Advair 250/50 1 puff until sample is gone and then back to 100/50 Twice daily   Please Take Furosemide daily.  Prednisone taper over next week.  Follow up Dr. Vassie Loll  In 6 weeks and As needed   Please contact office for sooner follow up if symptoms do not improve or worsen or seek emergency care

## 2013-04-25 MED ORDER — FLUTICASONE-SALMETEROL 250-50 MCG/DOSE IN AEPB: 1.0000 | INHALATION_SPRAY | Freq: Two times a day (BID) | RESPIRATORY_TRACT | Status: DC

## 2013-04-25 MED ORDER — FLUTICASONE-SALMETEROL 100-50 MCG/DOSE IN AEPB: 1.0000 | INHALATION_SPRAY | Freq: Two times a day (BID) | RESPIRATORY_TRACT | Status: DC

## 2013-04-25 NOTE — Addendum Note (Signed)
Addended by: Boone Master E on: 04/25/2013 11:40 AM   Modules accepted: Orders

## 2013-06-03 ENCOUNTER — Encounter: Payer: Self-pay | Admitting: Adult Health

## 2013-06-03 ENCOUNTER — Ambulatory Visit (INDEPENDENT_AMBULATORY_CARE_PROVIDER_SITE_OTHER): Payer: Self-pay | Admitting: Adult Health

## 2013-06-03 VITALS — BP 124/82 | HR 90 | Temp 98.2°F | Ht 58.75 in | Wt 243.0 lb

## 2013-06-03 DIAGNOSIS — J019 Acute sinusitis, unspecified: Secondary | ICD-10-CM

## 2013-06-03 DIAGNOSIS — J45909 Unspecified asthma, uncomplicated: Secondary | ICD-10-CM

## 2013-06-03 MED ORDER — AZITHROMYCIN 250 MG PO TABS: ORAL_TABLET | ORAL | Status: AC

## 2013-06-03 NOTE — Assessment & Plan Note (Signed)
Flare   Plan  Zpack take as directed. Mucinex DM Twice daily  As needed  Cough congestion  Mucinex DM Twice daily  As needed  Cough/congestion  Saline nasal rinses As needed   Follow up Dr. Vassie Loll  In  2-3 months and As needed   Please contact office for sooner follow up if symptoms do not improve or worsen or seek emergency care

## 2013-06-03 NOTE — Patient Instructions (Signed)
Zpack take as directed. Mucinex DM Twice daily  As needed  Cough congestion  Mucinex DM Twice daily  As needed  Cough/congestion  Saline nasal rinses As needed   Follow up Dr. Vassie Loll  In  2-3 months and As needed   Please contact office for sooner follow up if symptoms do not improve or worsen or seek emergency care

## 2013-06-03 NOTE — Assessment & Plan Note (Signed)
Improved control  Continue on current regimen

## 2013-06-03 NOTE — Progress Notes (Signed)
  Subjective:    Patient ID: Marikay Alar, female    DOB: Mar 11, 1951, 62 y.o.   MRN: [id number]  HPI  62 year old AAF patient with persistent Asthma well controlled on advair - when compliant.  02/19/08-- Presents for for 2 week of cough, congestion- yellow thick mucus and wheezing. Ran out of advair. 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  ? compliance with advair due to social reasons >>cont on Advair.  04/22/13 Acute OV  Complains of asthma flare.  Complains over last 2 weeks with occasional rattling and tightness, wheezing . Coughing up thick white much.  Currently applying for Disability and Medicaid.  Has orange card gets Advair.  Having lots of stress at home due to finances , was denied disability.  She is currently appealing decision. Does not feel she can stand for prolonged time due to joint pains and leg swelling.  No hemoptysis, chest pain , orthopnea, or fever.  Did not take lasix today.  [place]-but has hard time affording rent.  Discussed if not able to get rx thru orange card program to call back to send paper work to med assistance .  >>increased Advair x 2 week , pred pack.  Lasix daily on scheduled bases   06/03/2013  6 week follow up Asthma.  Patient says that her breathing is somewhat better, and she has decreased lower should be swelling. However, over the last 2,  weeks. She's had increased sinus congestion,, and pressure. She is blowing out yellow green mucus, and has some bloody sinus drainage. Patient denies any hemoptysis, orthopnea, PND, or leg swelling.   Review of Systems  Patient denies significant hemoptysis,  chest pain, palpitations, orthopnea, paroxysmal nocturnal dyspnea, lightheadedness, nausea, vomiting, abdominal or  leg pains      Objective:   Physical Exam  Gen. Pleasant, well-nourished-obese , in no distress ENT - no lesions, no post nasal drip, Maxillary sinus tenderness Neck: No JVD, no thyromegaly, no carotid bruits Lungs: no use of accessory muscles, no dullness to percussion, clear without rales or rhonchi  Cardiovascular: Rhythm regular, heart sounds  normal, no murmurs or gallops, 1-+ peripheral edema Musculoskeletal: No deformities, no cyanosis or clubbing         Assessment & Plan:

## 2013-07-05 ENCOUNTER — Telehealth: Payer: Self-pay | Admitting: Pulmonary Disease

## 2013-07-05 NOTE — Telephone Encounter (Signed)
Pt saw TP 06/03/13 giving ZPAK: I called gladys and she reports pt texted her and advised her she was having some nasal congestion. Didn't know any other symptoms. I advised will call pt. She reports she will call us back on Monday am as she has no minutes left and then the phone ended. Nothing further needed

## 2013-07-08 ENCOUNTER — Ambulatory Visit (INDEPENDENT_AMBULATORY_CARE_PROVIDER_SITE_OTHER): Payer: No Typology Code available for payment source | Admitting: Internal Medicine

## 2013-07-08 ENCOUNTER — Encounter: Payer: Self-pay | Admitting: Internal Medicine

## 2013-07-08 VITALS — BP 150/88 | HR 88 | Temp 98.0°F | Resp 32 | Ht 61.0 in | Wt 249.0 lb

## 2013-07-08 DIAGNOSIS — J45909 Unspecified asthma, uncomplicated: Secondary | ICD-10-CM

## 2013-07-08 DIAGNOSIS — J019 Acute sinusitis, unspecified: Secondary | ICD-10-CM

## 2013-07-08 MED ORDER — METHYLPREDNISOLONE ACETATE 80 MG/ML IJ SUSP: 120.0000 mg | Freq: Once | INTRAMUSCULAR | Status: DC

## 2013-07-08 MED ORDER — TRAMADOL HCL 50 MG PO TABS: 50.0000 mg | ORAL_TABLET | Freq: Four times a day (QID) | ORAL | Status: DC | PRN

## 2013-07-08 MED ORDER — SULFAMETHOXAZOLE-TMP DS 800-160 MG PO TABS: 1.0000 | ORAL_TABLET | Freq: Two times a day (BID) | ORAL | Status: DC

## 2013-07-08 NOTE — Progress Notes (Signed)
Subjective:    Patient ID: Mariah [name], female    DOB: Mar 03, 1951    MRN: [id number]   Brief patient profile:  62 year old AAF  Never smoker with persistent Asthma well controlled on advair - when compliant.   History of Present Illness  02/19/08-- Presents for for 2 week of cough, congestion- yellow thick mucus and wheezing. Ran out of advair. 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  ? compliance with advair due to social reasons >>cont on Advair.  04/22/13 Acute OV  Complains of asthma flare.  Complains over last 2 weeks with occasional rattling and tightness, wheezing . Coughing up thick white much.  Currently applying for Disability and Medicaid.  Has orange card gets Advair.  Having lots of stress at home due to finances , was denied disability.  She is currently appealing decision. Does not feel she can stand for prolonged time due to joint pains and leg swelling.  No hemoptysis, chest pain , orthopnea, or fever.  Did not take lasix today.  [place]-but has hard time affording rent.  Discussed if not able to get rx thru orange card program to call back to send paper work to med assistance .  >>increased Advair x 2 week , pred pack.  Lasix daily on scheduled bases   06/03/2013  6 week follow up Asthma.  Patient says that her breathing is somewhat better, and she has decreased lower  should be swelling. However, over the last 2, weeks. She's had increased sinus congestion,, and pressure. She is blowing out yellow green mucus, and has some bloody sinus drainage. Patient denies any hemoptysis, orthopnea, PND, or leg swelling. rec Zpack take as directed. Mucinex DM Twice daily  As needed  Cough congestion  Mucinex DM Twice daily  As needed  Cough/congestion  Saline nasal rinses As needed     07/08/2013 acute  ov/Josedaniel Haye re: sinus problems/ chronic asthma Chief Complaint  Patient presents with  . Follow-up    Zpak ineffective  R > L sinus pain tends to be worse in pm with green mucus drainage variable pattern but persistent since early Nov 2014 - not worse sleeping or early in am   No need for increase saba on advair bid but poor dpi technique noted.   No obvious day to day or daytime variabilty or assoc chronic cough or cp or chest tightness, subjective wheeze overt sinus or hb symptoms. No unusual exp hx or h/o childhood pna/ asthma or knowledge of premature birth.  Sleeping ok without nocturnal  or early am exacerbation  of respiratory  c/o's or need for noct saba. Also denies any obvious fluctuation of symptoms with weather or environmental changes or other aggravating or alleviating factors except as outlined above   Current Medications, Allergies, Complete  Past Medical History, Past Surgical History, Family History, and Social History were reviewed in [employer] record.  ROS  The following are not active complaints unless bolded sore throat, dysphagia, dental problems, itching, sneezing,  nasal congestion or excess/ purulent secretions, ear ache,   fever, chills, sweats, unintended wt loss, pleuritic or exertional cp, hemoptysis,  orthopnea pnd or leg swelling, presyncope, palpitations, heartburn, abdominal pain, anorexia, nausea, vomiting, diarrhea  or change in bowel or urinary habits, change in stools or urine, dysuria,hematuria,  rash,  arthralgias, visual complaints, headache, numbness weakness or ataxia or problems with walking or coordination,  change in mood/affect or memory.                   Objective:   Physical Exam  Wt Readings from Last 3 Encounters:  07/08/13 249 lb (112.946 kg)  06/03/13 243 lb (110.224 kg)  04/22/13 239 lb 3.2 oz (108.5 kg)      amb bf nad  HEENT: nl dentition, turbinates, and orophanx. Nl external ear canals without cough reflex   NECK :  without JVD/Nodes/TM/ nl carotid upstrokes bilaterally   LUNGS: no acc muscle use, clear to A and P bilaterally without cough on insp or exp maneuvers   CV:  RRR  no s3 or murmur or increase in P2, no edema   ABD:  soft and nontender with nl excursion in the supine position. No bruits or organomegaly, bowel sounds nl  MS:  warm without deformities, calf tenderness, cyanosis or clubbing  SKIN: warm and dry without lesions    NEURO:  alert, approp, no deficits          Assessment & Plan:

## 2013-07-08 NOTE — Patient Instructions (Addendum)
advair 100 twice daily smooth deep and out through the nose  Only use your albuterol (ventolin or proventil) as a rescue medication to be used if you can't catch your breath by resting or doing a relaxed purse lip breathing pattern.  - The less you use it, the better it will work when you need it. - Ok to use up to 2 puffs  every 4 hours if you must but call for immediate appointment if use goes up over your usual need - Don't leave home without it !!  (think of it like your spare tire for your car)   Bactrim ds one twice daily x 10 days > call [phone] and ask for [facility] to do sinus CT   For pain > ultram/tramadol 50 mg one every 4 hours if needed   Please schedule a follow up office visit in 4 weeks, sooner if needed with DR Vassie Loll

## 2013-07-09 NOTE — Assessment & Plan Note (Signed)
Clinical dx only - the pattern is not typical of sinus dz as not worse early in am's but she having purulent secretions and failied zpak > requesting bactrim "because can't afford avelox "  > ok to try 10 days but if not better then sinus ct before more abx  Reviewed also use of NS irrigation

## 2013-07-09 NOTE — Assessment & Plan Note (Signed)
All goals of chronic asthma control met including optimal function and elimination of symptoms with minimal need for rescue therapy.  Contingencies discussed in full including contacting this office immediately if not controlling the symptoms using the rule of two's.     The proper method of use, as well as anticipated side effects, of a dry powder inhaler are discussed and demonstrated to the patient. Improved effectiveness after extensive coaching during this visit to a level of approximately  90%

## 2013-07-26 ENCOUNTER — Telehealth: Payer: Self-pay | Admitting: Internal Medicine

## 2013-07-26 DIAGNOSIS — J3089 Other allergic rhinitis: Secondary | ICD-10-CM

## 2013-07-26 NOTE — Telephone Encounter (Signed)
Called number given and LMTCB

## 2013-07-26 NOTE — Telephone Encounter (Signed)
Called, spoke with pt.  She finished the batrim rx given by MW.  Reports symptoms are unchanged except she seems to be coughing more.  Cough is prod with clear mucus with occas specks of dark red blood.  Also has runny nose, sinus HA, rattling in chest, and increased SOB "that comes and goes."  No fever.  Uses albuterol hfa with relief "sometimes."  She is requesting further recs.  Dr. Melvyn Novas, pls advise.  Thank you.  [employer]

## 2013-07-26 NOTE — Telephone Encounter (Signed)
Called the number given and the person stated that the pt has been using his phone and the  Pt is not there with him now.  Wanted to know if pt could call back and i advised him that our office is closed now and he asked if we could call back in 10 mins.

## 2013-07-26 NOTE — Telephone Encounter (Signed)
Stop advair as may be contributing to cough.  Prednisone 10 mg take  4 each am x 2 days,   2 each am x 2 days,  1 each am x 2 days and stop   For cough mucinex dm 1200 mg every 12 hours  For breathing albterol up to every 4 hours  Sinus cT limited asap  F/u w/in 1 week

## 2013-07-30 NOTE — Telephone Encounter (Signed)
Spoke with a gentleman. Was advised pt used his phone to call us and he will find her and give her the message to call us back

## 2013-07-31 MED ORDER — PREDNISONE 10 MG PO TABS: ORAL_TABLET | ORAL | Status: DC

## 2013-07-31 NOTE — Telephone Encounter (Signed)
Pt returned call. Spoke with patient and advised of MW's recs as stated below -- stop the advair, pred taper, mucinex dm 1200mg  bid, albuterol q4h, sinus ct asap.  Pt verbalized her understanding and denied any questions.  Pt requesting pred taper be sent to [facility].  She is okay with the sinus ct but needs to know if her orange card will be accepted.  Advised pt will place this in the order to ensure her orange card will be accepted prior to scheduling.    Also asked pt for a good number to reach her by as there has been some difficulty reaching her by phone.  Pt stated that we can call her emergency contact (the name and number have been verified with the patient).  Pt stated that we can give all info to [facility] and she will text pt as pt does not have any minutes, but can receive messages. Contact Information   Name Relation Home Work [place] [phone]       Rx sent to [place] with TP was rescheduled to 1.15.15 @ 3:30pm per pt's request Order placed for CT Sinus asap w/ pt's question regarding her orange card and contact info

## 2013-07-31 NOTE — Telephone Encounter (Signed)
pts neighbor called back---he stated that the pt gives out his number b/c the pts phone has limited minutes and she is only able to text.    He will text the pt and let her know that she needs to call our office so we can go over what MW recs are for her.

## 2013-07-31 NOTE — Telephone Encounter (Signed)
I LMTCB on home number for the pt in chart. I ATC the number given someone answered and then hung up. I called back and message stated customer unavailable.  I called emergency contact and spoke with Regino Schultze and she states she will have to text the pt because she does not have any minutes on her phone and will let her know to call us ASAP. Will await call back.Lamb Bing, CMA

## 2013-08-05 ENCOUNTER — Ambulatory Visit (INDEPENDENT_AMBULATORY_CARE_PROVIDER_SITE_OTHER)
Admission: RE | Admit: 2013-08-05 | Discharge: 2013-08-05 | Disposition: A | Discharge status: Home / Self Care | Payer: No Typology Code available for payment source | Source: Ambulatory Visit | Attending: Internal Medicine | Admitting: Internal Medicine

## 2013-08-05 ENCOUNTER — Ambulatory Visit: Payer: No Typology Code available for payment source | Admitting: Adult Health

## 2013-08-05 ENCOUNTER — Encounter: Payer: Self-pay | Admitting: Internal Medicine

## 2013-08-05 DIAGNOSIS — J3089 Other allergic rhinitis: Secondary | ICD-10-CM

## 2013-08-06 NOTE — Progress Notes (Signed)
Quick Note:  Spoke with pt and notified of results per Dr. Wert. Pt verbalized understanding and denied any questions.  ______ 

## 2013-08-08 ENCOUNTER — Ambulatory Visit (INDEPENDENT_AMBULATORY_CARE_PROVIDER_SITE_OTHER): Payer: No Typology Code available for payment source | Admitting: Adult Health

## 2013-08-08 ENCOUNTER — Encounter: Payer: Self-pay | Admitting: Adult Health

## 2013-08-08 VITALS — BP 122/78 | HR 91 | Temp 97.6°F | Ht 64.0 in | Wt 250.5 lb

## 2013-08-08 DIAGNOSIS — J019 Acute sinusitis, unspecified: Secondary | ICD-10-CM

## 2013-08-08 NOTE — Patient Instructions (Signed)
Continue on current regimen .  Mucinex DM Twice daily  As needed  Cough/congestion  Saline nasal rinses As needed   Follow up Dr. Elsworth Soho  In  3  months and As needed   Please contact office for sooner follow up if symptoms do not improve or worsen or seek emergency care

## 2013-08-08 NOTE — Progress Notes (Signed)
Subjective:    Patient ID: Mariah [name], female    DOB: Nov 15, 1950    MRN: [id number]  Brief patient profile:  63 year old AAF  Never smoker with persistent Asthma well controlled on advair - when compliant.   History of Present Illness  02/19/08-- Presents for for 2 week of cough, congestion- yellow thick mucus and wheezing. Ran out of advair. 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  ? compliance with advair due to social reasons >>cont on Advair.  04/22/13 Acute OV  Complains of asthma flare.  Complains over last 2 weeks with occasional rattling and tightness, wheezing . Coughing up thick white much.  Currently applying for Disability and Medicaid.  Has orange card gets Advair.  Having lots of stress at home due to finances , was denied disability.  She is currently appealing decision. Does not feel she can stand for prolonged time due to joint pains and leg swelling.  No hemoptysis, chest pain , orthopnea, or fever.  Did not take lasix today.  [place]-but has hard time affording rent.  Discussed if not able to get rx thru orange card program to call back to send paper work to med assistance .  >>increased Advair x 2 week , pred pack.  Lasix daily on scheduled bases   06/03/2013  6 week follow up Asthma.  Patient says that her breathing is somewhat better, and she has decreased lower  should be swelling. However, over the last 2, weeks. She's had increased sinus congestion,, and pressure. She is blowing out yellow green mucus, and has some bloody sinus drainage. Patient denies any hemoptysis, orthopnea, PND, or leg swelling. rec Zpack take as directed. Mucinex DM Twice daily  As needed  Cough congestion  Mucinex DM Twice daily  As needed  Cough/congestion  Saline nasal rinses As needed     07/08/2013 acute  ov/Wert re: sinus problems/ chronic asthma Chief Complaint  Patient presents with  . Follow-up    Zpak ineffective  R > L sinus pain tends to be worse in pm with green mucus drainage variable pattern but persistent since early Nov 2014 - not worse sleeping or early in am  >>Rx advair, bactrim x 10   08/08/2013 Follow up  4 week follow up.  Had URI/sinusitis last ov , tx w/ bactrim x 10d .  CT sinus shoed no acute infection.  Reports still feels some sinus pressure/teeth aching though the pressure is decreased.  did not fill the prednisone until late. Does feel better. No fever chest pain, orthopnea, PND, or leg swelling.    Current Medications, Allergies, Complete Past Medical History, Past Surgical History, Family History, and Social History were reviewed in [employer] record.  ROS  The following are not active complaints unless  bolded sore throat, dysphagia, dental problems, itching, sneezing,   ear ache,   fever, chills, sweats, unintended wt loss, pleuritic or exertional cp, hemoptysis,  orthopnea pnd or leg swelling, presyncope, palpitations, heartburn, abdominal pain, anorexia, nausea, vomiting, diarrhea  or change in bowel or urinary habits, change in stools or urine, dysuria,hematuria,  rash, arthralgias, visual complaints, headache, numbness weakness or ataxia or problems with walking or coordination,  change in mood/affect or memory.                   Objective:   Physical Exam     amb bf nad  HEENT: nl dentition,  turbinates, and orophanx. Nl external ear canals without cough reflex   NECK :  without JVD/Nodes/TM/ nl carotid upstrokes bilaterally   LUNGS: no acc muscle use, clear to A and P bilaterally without cough on insp or exp maneuvers   CV:  RRR  no s3 or murmur or increase in P2, no edema   ABD:  soft and nontender with nl excursion in the supine position. No bruits or organomegaly, bowel sounds nl  MS:  warm without deformities, calf tenderness, cyanosis or clubbing  SKIN: warm and dry without lesions    NEURO:  alert, approp, no deficits          Assessment & Plan:

## 2013-08-15 NOTE — Assessment & Plan Note (Signed)
Recent flare now resolved  Ct sinus w/ no acute infection   Plan Continue on current regimen .  Mucinex DM Twice daily  As needed  Cough/congestion  Saline nasal rinses As needed   Follow up Dr. Elsworth Soho  In  3  months and As needed   Please contact office for sooner follow up if symptoms do not improve or worsen or seek emergency care

## 2013-08-20 ENCOUNTER — Encounter: Payer: Self-pay | Admitting: Pulmonary Disease

## 2013-08-20 ENCOUNTER — Ambulatory Visit (INDEPENDENT_AMBULATORY_CARE_PROVIDER_SITE_OTHER): Payer: No Typology Code available for payment source | Admitting: Pulmonary Disease

## 2013-08-20 VITALS — BP 142/80 | HR 89 | Temp 97.9°F | Ht 64.0 in | Wt 242.0 lb

## 2013-08-20 DIAGNOSIS — J45909 Unspecified asthma, uncomplicated: Secondary | ICD-10-CM

## 2013-08-20 MED ORDER — LORATADINE 10 MG PO TABS: 10.0000 mg | ORAL_TABLET | Freq: Every day | ORAL | Status: DC

## 2013-08-20 MED ORDER — FLUTICASONE-SALMETEROL 100-50 MCG/DOSE IN AEPB: 1.0000 | INHALATION_SPRAY | Freq: Two times a day (BID) | RESPIRATORY_TRACT | Status: DC

## 2013-08-20 MED ORDER — GUAIFENESIN-CODEINE 100-10 MG/5ML PO SYRP
5.0000 mL | ORAL_SOLUTION | Freq: Two times a day (BID) | ORAL | Status: DC
Start: 2013-08-20 — End: 2013-12-28

## 2013-08-20 NOTE — Assessment & Plan Note (Signed)
Refills on advair, claritin to mail order pharmacy Cheratussin cough syrup 5 ml twice daily x 7 days

## 2013-08-20 NOTE — Patient Instructions (Signed)
Increase lasix to twice daily x 7 days then back down to once daily Refills on advair, claritin to mail order pharmacy Cheratussin cough syrup 5 ml twice daily x 7 days

## 2013-08-20 NOTE — Progress Notes (Signed)
   Subjective:    Patient ID: Mariah [name], female    DOB: 1951/07/21, 63 y.o.   MRN: [id number]  HPI  63 year old AAF Never smoker with persistent Asthma well controlled on advair - when compliant.    3-4 flares in 2012  - has chronic  pedal edema , poor compliance with lasix- Has to take the bus     08/20/2013 Did not fill mucinex- wants Rx cough syrup C/o pedal edema Had URI/sinusitis dec2014, tx w/ bactrim x 10d .  CT sinus showed no acute infection.  Reports still feels some sinus pressure/teeth aching though the pressure is decreased. did not fill the prednisone until late. Does feel better. No fever chest pain, orthopnea, PND, or leg swelling.      Review of Systems neg for any significant sore throat, dysphagia, itching, sneezing, nasal congestion or excess/ purulent secretions, fever, chills, sweats, unintended wt loss, pleuritic or exertional cp, hempoptysis, orthopnea pnd or change in chronic leg swelling. Also denies presyncope, palpitations, heartburn, abdominal pain, nausea, vomiting, diarrhea or change in bowel or urinary habits, dysuria,hematuria, rash, arthralgias, visual complaints, headache, numbness weakness or ataxia.     Objective:   Physical Exam  Gen. Pleasant, obese, in no distress ENT - no lesions, no post nasal drip Neck: No JVD, no thyromegaly, no carotid bruits Lungs: no use of accessory muscles, no dullness to percussion, decreased without rales or rhonchi  Cardiovascular: Rhythm regular, heart sounds  normal, no murmurs or gallops, no peripheral edema Musculoskeletal: No deformities, no cyanosis or clubbing , no tremors       Assessment & Plan:

## 2013-08-20 NOTE — Assessment & Plan Note (Signed)
Increase lasix to twice daily x 7 days then back down to once daily

## 2013-09-24 ENCOUNTER — Other Ambulatory Visit (HOSPITAL_COMMUNITY): Payer: Self-pay | Admitting: Nurse Practitioner

## 2013-09-24 DIAGNOSIS — Z1231 Encounter for screening mammogram for malignant neoplasm of breast: Secondary | ICD-10-CM

## 2013-10-02 ENCOUNTER — Ambulatory Visit (HOSPITAL_COMMUNITY)
Admission: RE | Admit: 2013-10-02 | Discharge: 2013-10-02 | Disposition: A | Discharge status: Home / Self Care | Payer: Medicaid Other | Source: Ambulatory Visit | Attending: Nurse Practitioner | Admitting: Nurse Practitioner

## 2013-10-02 DIAGNOSIS — Z1231 Encounter for screening mammogram for malignant neoplasm of breast: Secondary | ICD-10-CM

## 2013-10-28 ENCOUNTER — Other Ambulatory Visit (HOSPITAL_COMMUNITY): Payer: Self-pay | Admitting: Nurse Practitioner

## 2013-10-28 DIAGNOSIS — R011 Cardiac murmur, unspecified: Secondary | ICD-10-CM

## 2013-11-06 ENCOUNTER — Ambulatory Visit (HOSPITAL_COMMUNITY)
Admission: RE | Admit: 2013-11-06 | Discharge: 2013-11-06 | Disposition: A | Discharge status: Home / Self Care | Payer: Medicaid Other | Source: Ambulatory Visit | Attending: Cardiovascular Disease | Admitting: Cardiovascular Disease

## 2013-11-06 DIAGNOSIS — I517 Cardiomegaly: Secondary | ICD-10-CM

## 2013-11-06 DIAGNOSIS — R011 Cardiac murmur, unspecified: Secondary | ICD-10-CM | POA: Insufficient documentation

## 2013-11-06 NOTE — Progress Notes (Signed)
2D Echo Performed 11/06/2013    Mariah [name], RCS

## 2013-11-27 ENCOUNTER — Telehealth: Payer: Self-pay | Admitting: Adult Health

## 2013-11-27 NOTE — Telephone Encounter (Signed)
Called and spoke with pt and she is aware that she will need to get the records from medical records for her hearing.  Pt voiced her understanding .

## 2013-11-27 NOTE — Telephone Encounter (Signed)
Pt returned call.  Wanted me to make sure you are aware that she needs this info for her upcoming hearing.  Mariah [name]

## 2013-11-27 NOTE — Telephone Encounter (Signed)
[id number] x1 

## 2013-12-28 ENCOUNTER — Emergency Department (HOSPITAL_COMMUNITY): Payer: Medicaid Other

## 2013-12-28 ENCOUNTER — Emergency Department (HOSPITAL_COMMUNITY)
Admission: EM | Admit: 2013-12-28 | Discharge: 2013-12-28 | Disposition: A | Discharge status: Home / Self Care | Payer: Medicaid Other | Attending: Emergency Medicine | Admitting: Emergency Medicine

## 2013-12-28 ENCOUNTER — Encounter (HOSPITAL_COMMUNITY): Payer: Self-pay | Admitting: Emergency Medicine

## 2013-12-28 DIAGNOSIS — I10 Essential (primary) hypertension: Secondary | ICD-10-CM | POA: Insufficient documentation

## 2013-12-28 DIAGNOSIS — K219 Gastro-esophageal reflux disease without esophagitis: Secondary | ICD-10-CM | POA: Insufficient documentation

## 2013-12-28 DIAGNOSIS — J40 Bronchitis, not specified as acute or chronic: Secondary | ICD-10-CM

## 2013-12-28 DIAGNOSIS — Z880 Allergy status to penicillin: Secondary | ICD-10-CM | POA: Insufficient documentation

## 2013-12-28 DIAGNOSIS — J45901 Unspecified asthma with (acute) exacerbation: Secondary | ICD-10-CM | POA: Insufficient documentation

## 2013-12-28 DIAGNOSIS — Z8669 Personal history of other diseases of the nervous system and sense organs: Secondary | ICD-10-CM | POA: Insufficient documentation

## 2013-12-28 DIAGNOSIS — E669 Obesity, unspecified: Secondary | ICD-10-CM | POA: Insufficient documentation

## 2013-12-28 DIAGNOSIS — M19079 Primary osteoarthritis, unspecified ankle and foot: Secondary | ICD-10-CM | POA: Insufficient documentation

## 2013-12-28 DIAGNOSIS — R000 Tachycardia, unspecified: Secondary | ICD-10-CM | POA: Insufficient documentation

## 2013-12-28 DIAGNOSIS — IMO0002 Reserved for concepts with insufficient information to code with codable children: Secondary | ICD-10-CM | POA: Insufficient documentation

## 2013-12-28 DIAGNOSIS — Z79899 Other long term (current) drug therapy: Secondary | ICD-10-CM | POA: Insufficient documentation

## 2013-12-28 DIAGNOSIS — R609 Edema, unspecified: Secondary | ICD-10-CM | POA: Insufficient documentation

## 2013-12-28 DIAGNOSIS — E119 Type 2 diabetes mellitus without complications: Secondary | ICD-10-CM | POA: Insufficient documentation

## 2013-12-28 LAB — BASIC METABOLIC PANEL
BUN: 7 mg/dL (ref 6–23)
CALCIUM: 9.4 mg/dL (ref 8.4–10.5)
CO2: 28 mEq/L (ref 19–32)
Chloride: 98 mEq/L (ref 96–112)
Creatinine, Ser: 0.74 mg/dL (ref 0.50–1.10)
GFR, EST NON AFRICAN AMERICAN: 90 mL/min — AB (ref >90)
Glucose, Bld: 158 mg/dL — ABNORMAL HIGH (ref 70–99)
POTASSIUM: 3.5 meq/L — AB (ref 3.7–5.3)
Sodium: 139 mEq/L (ref 137–147)

## 2013-12-28 LAB — CBC
HEMATOCRIT: 39 % (ref 36.0–46.0)
Hemoglobin: 13.1 g/dL (ref 12.0–15.0)
MCH: 30.2 pg (ref 26.0–34.0)
MCHC: 33.6 g/dL (ref 30.0–36.0)
MCV: 89.9 fL (ref 78.0–100.0)
Platelets: 397 10*3/uL (ref 150–400)
RBC: 4.34 MIL/uL (ref 3.87–5.11)
RDW: 13.9 % (ref 11.5–15.5)
WBC: 14.4 10*3/uL — AB (ref 4.0–10.5)

## 2013-12-28 LAB — I-STAT CHEM 8, ED
BUN: 7 mg/dL (ref 6–23)
CHLORIDE: 100 meq/L (ref 96–112)
Calcium, Ion: 1.14 mmol/L (ref 1.13–1.30)
Creatinine, Ser: 0.9 mg/dL (ref 0.50–1.10)
Glucose, Bld: 184 mg/dL — ABNORMAL HIGH (ref 70–99)
HCT: 43 % (ref 36.0–46.0)
HEMOGLOBIN: 14.6 g/dL (ref 12.0–15.0)
POTASSIUM: 4.1 meq/L (ref 3.7–5.3)
SODIUM: 138 meq/L (ref 137–147)
TCO2: 29 mmol/L (ref 0–100)

## 2013-12-28 LAB — I-STAT TROPONIN, ED: TROPONIN I, POC: 0 ng/mL (ref 0.00–0.08)

## 2013-12-28 MED ORDER — FLUTICASONE-SALMETEROL 100-50 MCG/DOSE IN AEPB: 1.0000 | INHALATION_SPRAY | Freq: Two times a day (BID) | RESPIRATORY_TRACT | Status: DC

## 2013-12-28 MED ORDER — IPRATROPIUM BROMIDE 0.02 % IN SOLN
0.5000 mg | Freq: Once | RESPIRATORY_TRACT | Status: AC
  Administered 2013-12-28: 0.5 mg via RESPIRATORY_TRACT
  Filled 2013-12-28: qty 2.5

## 2013-12-28 MED ORDER — ALBUTEROL SULFATE HFA 108 (90 BASE) MCG/ACT IN AERS: 1.0000 | INHALATION_SPRAY | Freq: Four times a day (QID) | RESPIRATORY_TRACT | Status: DC | PRN

## 2013-12-28 MED ORDER — PREDNISONE 20 MG PO TABS: ORAL_TABLET | ORAL | Status: DC

## 2013-12-28 MED ORDER — ALBUTEROL (5 MG/ML) CONTINUOUS INHALATION SOLN
10.0000 mg/h | INHALATION_SOLUTION | Freq: Once | RESPIRATORY_TRACT | Status: AC
  Administered 2013-12-28: 10 mg/h via RESPIRATORY_TRACT

## 2013-12-28 MED ORDER — ALBUTEROL SULFATE HFA 108 (90 BASE) MCG/ACT IN AERS
2.0000 | INHALATION_SPRAY | RESPIRATORY_TRACT | Status: DC | PRN
  Filled 2013-12-28: qty 6.7

## 2013-12-28 MED ORDER — IPRATROPIUM BROMIDE 0.02 % IN SOLN
1.0000 mg | Freq: Once | RESPIRATORY_TRACT | Status: AC
  Administered 2013-12-28: 1 mg via RESPIRATORY_TRACT
  Filled 2013-12-28: qty 5

## 2013-12-28 MED ORDER — ALBUTEROL (5 MG/ML) CONTINUOUS INHALATION SOLN
10.0000 mg/h | INHALATION_SOLUTION | RESPIRATORY_TRACT | Status: DC
  Administered 2013-12-28 (×2): 10 mg/h via RESPIRATORY_TRACT

## 2013-12-28 MED ORDER — AZITHROMYCIN 250 MG PO TABS
500.0000 mg | ORAL_TABLET | Freq: Once | ORAL | Status: AC
  Administered 2013-12-28: 500 mg via ORAL
  Filled 2013-12-28: qty 2

## 2013-12-28 MED ORDER — AZITHROMYCIN 250 MG PO TABS: 250.0000 mg | ORAL_TABLET | Freq: Every day | ORAL | Status: DC

## 2013-12-28 MED ORDER — PREDNISONE 20 MG PO TABS
60.0000 mg | ORAL_TABLET | Freq: Once | ORAL | Status: AC
  Administered 2013-12-28: 60 mg via ORAL
  Filled 2013-12-28: qty 3

## 2013-12-28 NOTE — ED Provider Notes (Signed)
CSN: [id number]     Arrival date & time 12/28/13  1310 History   First MD Initiated Contact with Patient 12/28/13 1350     Chief Complaint  Patient presents with  . Wheezing  . Cough     (Consider location/radiation/quality/duration/timing/severity/associated sxs/prior Treatment) HPI  63 year old female with history of allergic rhinitis, asthma, and non-insulin-dependent diabetes who presents complaining of shortness of breath and cough.  Patient reports gradual onset of productive cough, wheezing, and increased shortness of breath ongoing for the past 2 weeks. The symptom symptoms worsen at night. She reports having flareup. These flareup usually happens seasonal. She relates to her allergic rhinitis with persistent nasal congestion and nasal drainage. She has been using her breathing treatment at home with some relief. She denies fever, chills, headache, hemoptysis, dyspnea on exertion, abdominal pain nausea vomiting diarrhea, or rash. She is scheduled to followup with her primary care doctor on Monday but is here requesting further management of her condition. Patient otherwise without history of PE or DVT. She is a nonsmoker. No prior history of intubation or ICU stay.  Past Medical History  Diagnosis Date  . Thumb pain     right  . Arthritis     right anke/foot  . Allergic rhinitis   . Asthma   . HTN (hypertension)   . GERD (gastroesophageal reflux disease)   . Tendinitis   . Knee pain   . Bursitis   . Carpal tunnel syndrome   . Diabetes mellitus without complication    Past Surgical History  Procedure Laterality Date  . Uterine fibroid surgery  1988    Dr [name]  . Repeat fibroid excision  1997    Dr [name]  . Arthroscopic left knee surgery  1992  . Right heel surgery on plantar facitis  1995  . Wrist surgery     Family History  Problem Relation Age of Onset  . Uterine cancer Mother   . Hypertension Mother   . Hypertension Father   . Alzheimer's disease Father     History  Substance Use Topics  . Smoking status: Never Smoker   . Smokeless tobacco: Never Used  . Alcohol Use: No   OB History   Grav Para Term Preterm Abortions TAB SAB Ect Mult Living                 Review of Systems  All other systems reviewed and are negative.     Allergies  Codeine; Doxycycline; and Penicillins  Home Medications   Prior to Admission medications   Medication Sig Start Date End Date Taking? Authorizing Provider  albuterol (PROVENTIL HFA;VENTOLIN HFA) 108 (90 BASE) MCG/ACT inhaler Inhale 1-2 puffs into the lungs every 6 (six) hours as needed for wheezing. 04/13/12   Annalee Genta, PA-C  amLODipine (NORVASC) 5 MG tablet Take 2 tablets (10 mg total) by mouth at bedtime. 10/23/12   Ripudeep Krystal Eaton, MD  celecoxib (CELEBREX) 200 MG capsule Take 200 mg by mouth daily.    Historical Provider, MD  cloNIDine (CATAPRES) 0.2 MG tablet Take 1 tablet (0.2 mg total) by mouth 3 (three) times daily. 10/23/12   Ripudeep Krystal Eaton, MD  fluticasone (FLONASE) 50 MCG/ACT nasal spray Place 2 sprays into the nose daily.    Historical Provider, MD  Fluticasone-Salmeterol (ADVAIR DISKUS) 100-50 MCG/DOSE AEPB Inhale 1 puff into the lungs every 12 (twelve) hours. 08/20/13   Rigoberto Noel, MD  furosemide (LASIX) 40 MG tablet Take 1 tablet (40 mg  total) by mouth daily. 04/22/13   Tammy S Parrett, NP  glipiZIDE (GLUCOTROL XL) 10 MG 24 hr tablet Take 10 mg by mouth daily with breakfast.    Historical Provider, MD  guaiFENesin-codeine (CHERATUSSIN AC) 100-10 MG/5ML syrup Take 5 mLs by mouth 2 (two) times daily. 08/20/13   Rigoberto Noel, MD  loratadine (CLARITIN) 10 MG tablet Take 1 tablet (10 mg total) by mouth daily. 08/20/13   Rigoberto Noel, MD  metFORMIN (GLUCOPHAGE) 1000 MG tablet Take 1,000 mg by mouth daily with breakfast.    Historical Provider, MD  omeprazole (PRILOSEC) 20 MG capsule Take 20 mg by mouth daily.    Historical Provider, MD  RABEprazole (ACIPHEX) 20 MG tablet Take 20 mg by mouth  daily.     Historical Provider, MD   BP 154/79  Pulse 109  Temp(Src) 98.7 F (37.1 C) (Oral)  Resp 22  SpO2 100% Physical Exam  Nursing note and vitals reviewed. Constitutional: She is oriented to person, place, and time. She appears well-developed and well-nourished. No distress.  Patient is moderately obese, sitting upright with audible wheezing and in mild respiratory discomfort  HENT:  Head: Atraumatic.  Mouth/Throat: Oropharynx is clear and moist.  Eyes: Conjunctivae are normal.  Neck: Neck supple. No JVD present. No tracheal deviation present.  Cardiovascular:  Mild tachycardia without murmurs rubs or gallop  Pulmonary/Chest:  Patient with audible wheezing, expiratory expiratory wheezes heard throughout all lung fields. Occasional cough. No accessory muscle use. Speaking in complete sentences.  Abdominal: Soft. There is no tenderness.  Musculoskeletal: She exhibits edema (Trace bilateral pitting edema noted on exam. Palpable pedal pulses.).  Neurological: She is alert and oriented to person, place, and time.  Skin: No rash noted.  Psychiatric: She has a normal mood and affect.    ED Course  Procedures (including critical care time)  2:11 PM Patient presents with an asthma exacerbation. She is actively wheezing. Breathing treatment, and steroids given. Will continue to monitor.  Care discussed with Dr. Tamera Punt.  3:06 PM EKG without acute ischemic changes, normal troponin. Patient has a mild elevated WBC of 14.4. A chest x-ray demonstrated mild diffuse peribronchial cuffing which may suggest mild bronchitis. Given her worsening breathing and coughing symptoms and history of asthma, I will provide Zithromax to treat for atypical infection. After receiving breathing treatment patient felt much better however on exam patient is still having persistent moderate wheezing. I will continue with her treatment.  3:48 PM Care discussed with oncoming provider who will d/c pt pending  improvement of sxs.  Pt will also f/u with her PCP Dr. Dagmar Hait this coming Monday.  I will also refill her breathing inhaler, prescribe steroid course, and Zpak.    Labs Review Labs Reviewed  CBC - Abnormal; Notable for the following:    WBC 14.4 (*)    All other components within normal limits  BASIC METABOLIC PANEL - Abnormal; Notable for the following:    Potassium 3.5 (*)    Glucose, Bld 158 (*)    GFR calc non Af Amer 90 (*)    All other components within normal limits  I-STAT CHEM 8, ED - Abnormal; Notable for the following:    Glucose, Bld 184 (*)    All other components within normal limits  I-STAT TROPOININ, ED    Imaging Review Dg Chest 2 View  12/28/2013   CLINICAL DATA:  Cough, congestion and wheezing for the past week.  EXAM: CHEST  2 VIEW  COMPARISON:  Chest x-ray 05/27/2011.  FINDINGS: Mild diffuse peribronchial cuffing. Lung volumes are normal. No consolidative airspace disease. No pleural effusions. No pneumothorax. No pulmonary nodule or mass noted. Pulmonary vasculature and the cardiomediastinal silhouette are within normal limits. Atherosclerotic calcifications within the thoracic aorta.  IMPRESSION: 1. Mild diffuse peribronchial cuffing may suggest mild bronchitis. 2. Atherosclerosis.   Electronically Signed   By: Vinnie Langton M.D.   On: 12/28/2013 13:57     EKG Interpretation None      Date: 12/28/2013  Rate: 102  Rhythm: sinus tachycardia  QRS Axis: normal  Intervals: normal  ST/T Wave abnormalities: nonspecific T wave changes  Conduction Disutrbances:none  Narrative Interpretation:   Old EKG Reviewed: unchanged    MDM   Final diagnoses:  Asthma exacerbation attacks  Bronchitis    BP 139/70  Pulse 117  Temp(Src) 98.7 F (37.1 C) (Oral)  Resp 24  SpO2 98%  I have reviewed nursing notes and vital signs. I personally reviewed the imaging tests through PACS system  I reviewed available ER/hospitalization records thought the EMR     Domenic Moras, [place] 12/29/13 4540

## 2013-12-28 NOTE — ED Notes (Signed)
Respiratory at bedside.

## 2013-12-28 NOTE — Discharge Instructions (Signed)
Chronic Asthmatic Bronchitis Chronic asthmatic bronchitis is a complication of persistent asthma. After a period of time with asthma, some people develop airflow obstruction that is present all the time, even when not having an asthma attack.There is also persistent inflammation of the airways, and the bronchial tubes produce more mucus. Chronic asthmatic bronchitis usually is a permanent problem with the lungs. CAUSES  Chronic asthmatic bronchitis happens most often in people who have asthma and also smoke cigarettes. Occasionally, it can happen to a person with long-standing or severe asthma even if the person is not a smoker. SIGNS AND SYMPTOMS  Chronic asthmatic bronchitis usually causes symptoms of both asthma and chronic bronchitis, including:   Coughing.  Increased sputum production.  Wheezing and shortness of breath.  Chest discomfort.  Recurring infections. DIAGNOSIS  Your health care provider will take a medical history and perform a physical exam. Chronic asthmatic bronchitis is suspected when a person with asthma has abnormal results on breathing tests (pulmonary function tests) even when breathing symptoms are at their best. Other tests, such as a chest X-ray, may be performed to rule out other conditions.  TREATMENT  Treatment involves controlling symptoms with medicine and lifestyle changes.  Your health care provider may prescribe asthma medicines, including inhaler and nebulizer medicines.  Infection can be treated with medicine to kill germs (antibiotics). Serious infections may require hospitalization. These can include:  Pneumonia.  Sinus infections.  Acute bronchitis.   Preventing infection and hospitalization is very important. Get an influenza vaccination every year as directed by your health care provider. Ask your health care provider whether you need a pneumonia vaccine.  Ask your health care provider whether you would benefit from a pulmonary  rehabilitation program. [place]  Only take over-the-counter or prescription medicine as directed by your health care provider.  If you are a cigarette smoker, the most important thing that you can do is quit. Talk to your health care provider for help with quitting smoking.  Avoid pollen, dust, animal dander, molds, smoke, and other things that cause attacks.  Regular exercise is very important to help you feel better. Discuss possible exercise routines with your health care provider.  If animal dander is the cause of asthma, you may not be able to keep pets.  It is important that you:  Become educated about your medical condition.  Participate in maintaining wellness.  Seek medical care as directed. Delay in seeking medical care could cause permanent injury and may be a risk to your life. SEEK MEDICAL CARE IF:  You have wheezing and shortness of breath even if taking medicine to prevent attacks.  You have muscle aches, chest pain, or thickening of sputum.  Your sputum changes from clear or white to yellow, green, gray, or bloody. SEEK IMMEDIATE MEDICAL CARE IF:  Your usual medicines do not stop your wheezing.  You have increased coughing or shortness of breath or both.  You have increased difficulty breathing.  You have any problems from the medicine you are taking, such as a rash, itching, swelling, or trouble breathing. MAKE SURE YOU:   Understand these instructions.  Will watch your condition.  Will get help right away if you are not doing well or get worse. Document Released: 04/28/2006 Document Revised: 05/01/2013 Document Reviewed: 02/07/2013 [facility] Patient Information 2014 [place], [place].

## 2013-12-28 NOTE — ED Provider Notes (Signed)
Patient hand off by Domenic Moras, PA-C  Patient came in for shortness of breath. She has been having URI symptoms with increasingly worsening wheezing. She is a diabetic and has history of asthma. Has been taking breathing treatments at home but they were not helping. Otherwise she has been feeling normal and without weakness, chest pains, headaches. Gertie Fey has given her a breathing treatment in the ED and on evaluation he felt that she had not yet improved enough. Therefore another breathing treatment is necessary. I have ordered hour long continuous neb after evaluating her lungs. She is still wheezing significantly but came into the ED sating at 100% room air and she continues to saturate at 100%.  Results for orders placed during the hospital encounter of 12/28/13  CBC      Result Value Ref Range   WBC 14.4 (*) 4.0 - 10.5 K/uL   RBC 4.34  3.87 - 5.11 MIL/uL   Hemoglobin 13.1  12.0 - 15.0 g/dL   HCT 39.0  36.0 - 46.0 %   MCV 89.9  78.0 - 100.0 fL   MCH 30.2  26.0 - 34.0 pg   MCHC 33.6  30.0 - 36.0 g/dL   RDW 13.9  11.5 - 15.5 %   Platelets 397  150 - 400 K/uL  BASIC METABOLIC PANEL      Result Value Ref Range   Sodium 139  137 - 147 mEq/L   Potassium 3.5 (*) 3.7 - 5.3 mEq/L   Chloride 98  96 - 112 mEq/L   CO2 28  19 - 32 mEq/L   Glucose, Bld 158 (*) 70 - 99 mg/dL   BUN 7  6 - 23 mg/dL   Creatinine, Ser 0.74  0.50 - 1.10 mg/dL   Calcium 9.4  8.4 - 10.5 mg/dL   GFR calc non Af Amer 90 (*) >90 mL/min   GFR calc Af Amer >90  >90 mL/min  I-STAT TROPOININ, ED      Result Value Ref Range   Troponin i, poc 0.00  0.00 - 0.08 ng/mL   Comment 3           I-STAT CHEM 8, ED      Result Value Ref Range   Sodium 138  137 - 147 mEq/L   Potassium 4.1  3.7 - 5.3 mEq/L   Chloride 100  96 - 112 mEq/L   BUN 7  6 - 23 mg/dL   Creatinine, Ser 0.90  0.50 - 1.10 mg/dL   Glucose, Bld 184 (*) 70 - 99 mg/dL   Calcium, Ion 1.14  1.13 - 1.30 mmol/L   TCO2 29  0 - 100 mmol/L   Hemoglobin 14.6  12.0 - 15.0  g/dL   HCT 43.0  36.0 - 46.0 %   Dg Chest 2 View  12/28/2013   CLINICAL DATA:  Cough, congestion and wheezing for the past week.  EXAM: CHEST  2 VIEW  COMPARISON:  Chest x-ray 05/27/2011.  FINDINGS: Mild diffuse peribronchial cuffing. Lung volumes are normal. No consolidative airspace disease. No pleural effusions. No pneumothorax. No pulmonary nodule or mass noted. Pulmonary vasculature and the cardiomediastinal silhouette are within normal limits. Atherosclerotic calcifications within the thoracic aorta.  IMPRESSION: 1. Mild diffuse peribronchial cuffing may suggest mild bronchitis. 2. Atherosclerosis.   Electronically Signed   By: Vinnie Langton M.D.   On: 12/28/2013 13:57   4:46 pm Respiratory has been consulted and I am currently waiting for patient to be started on continuous nebulizer.  CRITICAL CARE Performed by: Linus Mako Total critical care time: 30 Critical care time was exclusive of separately billable procedures and treating other patients. Critical care was necessary to treat or prevent imminent or life-threatening deterioration. Critical care was time spent personally by me on the following activities: development of treatment plan with patient and/or surrogate as well as nursing, discussions with consultants, evaluation of patient's response to treatment, examination of patient, obtaining history from patient or surrogate, ordering and performing treatments and interventions, ordering and review of laboratory studies, ordering and review of radiographic studies, pulse oximetry and re-evaluation of patient's condition.  After hour long patient responded well and maintained her sats. They did drop to 89% towards the end of ambulating but responded well back up to 95% very  Quickly when resting. Reviewed with Dr. Stevie Kern prior to discharge who feels comfortable with the plan.  63 y.o.Bronda Alfred Siegfried's evaluation in the Emergency Department is complete. It has been determined  that no acute conditions requiring further emergency intervention are present at this time. The patient/guardian have been advised of the diagnosis and plan. We have discussed signs and symptoms that warrant return to the ED, such as changes or worsening in symptoms.  Vital signs are stable at discharge. Filed Vitals:   12/28/13 1729  BP: 161/72  Pulse: 117  Temp:   Resp: 23    Patient/guardian has voiced understanding and agreed to follow-up with the PCP or specialist.   Linus Mako, PA-C 12/28/13 1923

## 2013-12-28 NOTE — ED Notes (Signed)
PA at bedside.

## 2013-12-28 NOTE — ED Notes (Signed)
Respiratory called for breathing treatment.

## 2013-12-28 NOTE — ED Notes (Signed)
Patient reports that she has had SOB and coughing x 3 to 4 days. The patient reports that she has a hacking cough with sinus drainage

## 2013-12-28 NOTE — ED Notes (Addendum)
While walking patients Os stats dropped down to 80, nurse lauren was notified and Tiffany PA was notified.

## 2013-12-28 NOTE — ED Notes (Signed)
Breathing treatment in progress. Pt watching T.V.

## 2013-12-28 NOTE — ED Notes (Addendum)
Pt ambulated with pulse ox. Pt O2 dropped to 89% while walking, HR 130. PA made aware.

## 2013-12-30 ENCOUNTER — Telehealth: Payer: Self-pay | Admitting: Pulmonary Disease

## 2013-12-30 MED ORDER — PREDNISONE 10 MG PO TABS: ORAL_TABLET | ORAL | Status: DC

## 2013-12-30 MED ORDER — FLUTICASONE-SALMETEROL 250-50 MCG/DOSE IN AEPB: 1.0000 | INHALATION_SPRAY | Freq: Two times a day (BID) | RESPIRATORY_TRACT | Status: DC

## 2013-12-30 MED ORDER — AZITHROMYCIN 250 MG PO TABS: ORAL_TABLET | ORAL | Status: DC

## 2013-12-30 NOTE — Telephone Encounter (Signed)
Spoke with pt. She was seen in ED 12/28/13. RX for prednisone 20 mg, azithromax, advair and clonidine 0.2 mg was suppose to be called into med assist but never was received. Pt called the hospital and was told to call us for the RX's as we have access to what went on when she was seen and what was suppose to be called in. Please advise Dr. Elsworth Soho thanks

## 2013-12-30 NOTE — ED Provider Notes (Signed)
Medical screening examination/treatment/procedure(s) were performed by non-physician practitioner and as supervising physician I was immediately available for consultation/collaboration.   EKG Interpretation   Date/Time:  Saturday December 28 2013 14:14:19 EDT Ventricular Rate:  102 PR Interval:  161 QRS Duration: 84 QT Interval:  353 QTC Calculation: 460 R Axis:   5 Text Interpretation:  Sinus tachycardia Ventricular premature complex  Consider right atrial enlargement Anteroseptal infarct, old Borderline T  abnormalities, inferior leads Artifact in lead(s) I II aVR aVL aVF No old  tracing to compare Confirmed by BELFI  MD, MELANIE ([id number]) on 12/28/2013  4:14:07 PM       Babette Relic, MD 12/30/13 2213

## 2013-12-30 NOTE — Telephone Encounter (Signed)
We cannot call in meds for ER Needs OV ASAP with TP to assess OK to call in  - Prednisone 10 mg tabs  Take 2 tabs daily with food x 5ds, then 1 tab daily with food x 5ds then STOP advair 250/50 Zpak

## 2013-12-30 NOTE — Telephone Encounter (Signed)
Called, spoke with pt.   Explained below to her per RA. She verbalized understanding of this and is aware pred 10mg  tabs, advair 250, and zpak sent to [place] per her request. We have scheduled her to see TP tomorrow at 3 pm at [facility] - pt aware. She verbalized understanding of all instructions, voiced no further questions or concerns at this time, and is aware to seek emergency care if needed.

## 2013-12-31 ENCOUNTER — Ambulatory Visit (INDEPENDENT_AMBULATORY_CARE_PROVIDER_SITE_OTHER): Payer: No Typology Code available for payment source | Admitting: Adult Health

## 2013-12-31 ENCOUNTER — Encounter: Payer: Self-pay | Admitting: Adult Health

## 2013-12-31 VITALS — BP 136/74 | HR 110 | Temp 98.1°F | Ht 64.0 in | Wt 238.8 lb

## 2013-12-31 DIAGNOSIS — J209 Acute bronchitis, unspecified: Secondary | ICD-10-CM

## 2013-12-31 MED ORDER — PREDNISONE 10 MG PO TABS: ORAL_TABLET | ORAL | Status: DC

## 2013-12-31 MED ORDER — LEVALBUTEROL HCL 0.63 MG/3ML IN NEBU
0.6300 mg | INHALATION_SOLUTION | Freq: Once | RESPIRATORY_TRACT | Status: AC
  Administered 2013-12-31: 0.63 mg via RESPIRATORY_TRACT

## 2013-12-31 MED ORDER — AZITHROMYCIN 250 MG PO TABS: ORAL_TABLET | ORAL | Status: AC

## 2013-12-31 NOTE — Assessment & Plan Note (Signed)
Acute asthmatic bronchitic exacerbation  xopenex neb x  1   Plan  Zpack take as directed. Mucinex DM Twice daily  As needed  Cough congestion  Mucinex DM Twice daily  As needed  Cough/congestion  Saline nasal rinses As needed   Prednisone taper over next week.  Follow up Dr. Elsworth Soho  In  6 weeks  and As needed   Please contact office for sooner follow up if symptoms do not improve or worsen or seek emergency care

## 2013-12-31 NOTE — Patient Instructions (Addendum)
Zpack take as directed. Mucinex DM Twice daily  As needed  Cough congestion  Mucinex DM Twice daily  As needed  Cough/congestion  Saline nasal rinses As needed   Prednisone taper over next week.  Follow up Dr. Elsworth Soho  In  6 weeks  and As needed   Please contact office for sooner follow up if symptoms do not improve or worsen or seek emergency care

## 2013-12-31 NOTE — Addendum Note (Signed)
Addended by: Clayborne Dana C on: 12/31/2013 05:15 PM   Modules accepted: Orders

## 2013-12-31 NOTE — Progress Notes (Signed)
Subjective:    Patient ID: Mariah [name], female    DOB: Dec 30, 1950    MRN: [id number]  Brief patient profile:  63 year old AAF  Never smoker with persistent Asthma well controlled on advair - when compliant.   History of Present Illness  02/19/08-- Presents for for 2 week of cough, congestion- yellow thick mucus and wheezing. Ran out of advair. 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  ? compliance with advair due to social reasons >>cont on Advair.  04/22/13 Acute OV  Complains of asthma flare.  Complains over last 2 weeks with occasional rattling and tightness, wheezing . Coughing up thick white much.  Currently applying for Disability and Medicaid.  Has orange card gets Advair.  Having lots of stress at home due to finances , was denied disability.  She is currently appealing decision. Does not feel she can stand for prolonged time due to joint pains and leg swelling.  No hemoptysis, chest pain , orthopnea, or fever.  Did not take lasix today.  [place]-but has hard time affording rent.  Discussed if not able to get rx thru orange card program to call back to send paper work to med assistance .  >>increased Advair x 2 week , pred pack.  Lasix daily on scheduled bases   06/03/2013  6 week follow up Asthma.  Patient says that her breathing is somewhat better, and she has decreased lower  should be swelling. However, over the last 2, weeks. She's had increased sinus congestion,, and pressure. She is blowing out yellow green mucus, and has some bloody sinus drainage. Patient denies any hemoptysis, orthopnea, PND, or leg swelling. rec Zpack take as directed. Mucinex DM Twice daily  As needed  Cough congestion  Mucinex DM Twice daily  As needed  Cough/congestion  Saline nasal rinses As needed     07/08/2013 acute  ov/Wert re: sinus problems/ chronic asthma Chief Complaint  Patient presents with  . Follow-up    Zpak ineffective  R > L sinus pain tends to be worse in pm with green mucus drainage variable pattern but persistent since early Nov 2014 - not worse sleeping or early in am  >>Rx advair, bactrim x 10   08/08/2013 Follow up  4 week follow up.  Had URI/sinusitis last ov , tx w/ bactrim x 10d .  CT sinus shoed no acute infection.  Reports still feels some sinus pressure/teeth aching though the pressure is decreased.  did not fill the prednisone until late. Does feel better. No fever chest pain, orthopnea, PND, or leg swelling.  12/31/2013 Acute OV  Complains of  head congestion and prod cough with green/yellow/gray/white mucus, wheezing, increased SOB, tightness x2 weeks.  Went to ER 6/6 , cxr w/ no acute process noted.  tx  w/ abx and steroid but did not get them.  Denies f/c/s, hemoptysis, nausea, vomiting, or increased edema.  Is out of Advair. Has not gotten in mail this month. Needs sample until she gets delivery.    Current Medications, Allergies, Complete Past Medical History, Past Surgical History, Family History, and Social History were reviewed in [employer] record.  ROS  The following are not active complaints unless bolded sore throat, dysphagia, dental problems, itching,   fever, chills, sweats, unintended wt loss, pleuritic or exertional cp, hemoptysis,  orthopnea pnd or leg swelling, presyncope, palpitations, heartburn, abdominal  pain, anorexia, nausea, vomiting, diarrhea  or change in bowel or urinary habits, change in stools or urine, dysuria,hematuria,  rash, arthralgias, visual complaints, headache, numbness weakness or ataxia or problems with walking or coordination,  change in mood/affect or memory.                   Objective:   Physical Exam     amb bf nad, obese   HEENT: nl dentition, turbinates, and orophanx. Nl external ear canals without cough reflex   NECK :  without JVD/Nodes/TM/ nl carotid upstrokes bilaterally   LUNGS: no acc muscle use, faint exp wheezing w/ upper airway psuedowheezing   CV:  RRR  no s3 or murmur or increase in P2, tr  edema , VI changes w/ stasis dermatitis   ABD:  soft and nontender with nl excursion in the supine position. No bruits or organomegaly, bowel sounds nl  MS:  warm without deformities, calf tenderness, cyanosis or clubbing  SKIN: warm and dry without lesions    NEURO:  alert, approp, no deficits          Assessment & Plan:

## 2013-12-31 NOTE — ED Provider Notes (Signed)
Medical screening examination/treatment/procedure(s) were performed by non-physician practitioner and as supervising physician I was immediately available for consultation/collaboration.   EKG Interpretation   Date/Time:  Saturday December 28 2013 14:14:19 EDT Ventricular Rate:  102 PR Interval:  161 QRS Duration: 84 QT Interval:  353 QTC Calculation: 460 R Axis:   5 Text Interpretation:  Sinus tachycardia Ventricular premature complex  Consider right atrial enlargement Anteroseptal infarct, old Borderline T  abnormalities, inferior leads Artifact in lead(s) I II aVR aVL aVF No old  tracing to compare Confirmed by Jaiyanna Safran  MD, Shaaron Golliday ([id number]) on 12/28/2013  4:14:07 PM        Malvin Johns, MD 12/31/13 [phone]

## 2014-01-01 NOTE — Progress Notes (Signed)
Reviewed & agree with plan  

## 2014-02-12 ENCOUNTER — Ambulatory Visit (INDEPENDENT_AMBULATORY_CARE_PROVIDER_SITE_OTHER): Payer: Medicaid Other | Admitting: Pulmonary Disease

## 2014-02-12 ENCOUNTER — Encounter (INDEPENDENT_AMBULATORY_CARE_PROVIDER_SITE_OTHER): Payer: Self-pay

## 2014-02-12 ENCOUNTER — Encounter: Payer: Self-pay | Admitting: Pulmonary Disease

## 2014-02-12 VITALS — BP 130/76 | HR 98 | Ht 61.0 in | Wt 233.0 lb

## 2014-02-12 DIAGNOSIS — J45909 Unspecified asthma, uncomplicated: Secondary | ICD-10-CM

## 2014-02-12 NOTE — Patient Instructions (Signed)
You have to take advair twice daily Make sure that you have your rescue inhaler

## 2014-02-12 NOTE — Progress Notes (Signed)
   Subjective:    Patient ID: Mariah [name], female    DOB: 1950/08/28, 63 y.o.   MRN: [id number]  HPI 63 year old AAF Never smoker with persistent Asthma well controlled on advair -poor compliance due to financial reasons.    02/12/2014  Chief Complaint  Patient presents with  . Follow-up    Breathing improved since last OV   She has one ED visit in the last 6 months Went to ER 6/6 , cxr w/ no acute process noted. tx w/ abx and steroid but did not get them.  12/31/2013 Acute OV >> given advair sample  She has Medicaid now but states that she cannot afford her co-pay of $3 for Advair.    Review of Systems neg for any significant sore throat, dysphagia, itching, sneezing, nasal congestion or excess/ purulent secretions, fever, chills, sweats, unintended wt loss, pleuritic or exertional cp, hempoptysis, orthopnea pnd or change in chronic leg swelling. Also denies presyncope, palpitations, heartburn, abdominal pain, nausea, vomiting, diarrhea or change in bowel or urinary habits, dysuria,hematuria, rash, arthralgias, visual complaints, headache, numbness weakness or ataxia.     Objective:   Physical Exam  Gen. Pleasant, obese, in no distress ENT - no lesions, no post nasal drip Neck: No JVD, no thyromegaly, no carotid bruits Lungs: no use of accessory muscles, no dullness to percussion, decreased without rales or rhonchi  Cardiovascular: Rhythm regular, heart sounds  normal, no murmurs or gallops, no peripheral edema Musculoskeletal: No deformities, no cyanosis or clubbing , no tremors       Assessment & Plan:

## 2014-02-13 NOTE — Assessment & Plan Note (Signed)
You have to take advair twice daily Make sure that you have your rescue inhaler   Really the issue here is compliance with her maintenance steroid medication. Hopefully now that her insurance issues are sorted out, she will be more compliant.

## 2014-02-17 ENCOUNTER — Ambulatory Visit: Payer: No Typology Code available for payment source | Admitting: Adult Health

## 2014-02-21 ENCOUNTER — Other Ambulatory Visit: Payer: Self-pay | Admitting: Pulmonary Disease

## 2014-02-27 ENCOUNTER — Ambulatory Visit: Payer: No Typology Code available for payment source

## 2014-03-01 ENCOUNTER — Emergency Department (HOSPITAL_COMMUNITY)
Admission: EM | Admit: 2014-03-01 | Discharge: 2014-03-01 | Disposition: A | Discharge status: Home / Self Care | Payer: Medicaid Other | Attending: Emergency Medicine | Admitting: Emergency Medicine

## 2014-03-01 ENCOUNTER — Encounter (HOSPITAL_COMMUNITY): Payer: Self-pay | Admitting: Emergency Medicine

## 2014-03-01 DIAGNOSIS — J45909 Unspecified asthma, uncomplicated: Secondary | ICD-10-CM | POA: Insufficient documentation

## 2014-03-01 DIAGNOSIS — L0889 Other specified local infections of the skin and subcutaneous tissue: Secondary | ICD-10-CM | POA: Diagnosis not present

## 2014-03-01 DIAGNOSIS — Z8669 Personal history of other diseases of the nervous system and sense organs: Secondary | ICD-10-CM | POA: Diagnosis not present

## 2014-03-01 DIAGNOSIS — R21 Rash and other nonspecific skin eruption: Secondary | ICD-10-CM | POA: Insufficient documentation

## 2014-03-01 DIAGNOSIS — Z79899 Other long term (current) drug therapy: Secondary | ICD-10-CM | POA: Diagnosis not present

## 2014-03-01 DIAGNOSIS — L089 Local infection of the skin and subcutaneous tissue, unspecified: Secondary | ICD-10-CM

## 2014-03-01 DIAGNOSIS — IMO0002 Reserved for concepts with insufficient information to code with codable children: Secondary | ICD-10-CM | POA: Insufficient documentation

## 2014-03-01 DIAGNOSIS — K219 Gastro-esophageal reflux disease without esophagitis: Secondary | ICD-10-CM | POA: Insufficient documentation

## 2014-03-01 DIAGNOSIS — I10 Essential (primary) hypertension: Secondary | ICD-10-CM | POA: Diagnosis not present

## 2014-03-01 DIAGNOSIS — Z880 Allergy status to penicillin: Secondary | ICD-10-CM | POA: Diagnosis not present

## 2014-03-01 DIAGNOSIS — R109 Unspecified abdominal pain: Secondary | ICD-10-CM | POA: Diagnosis not present

## 2014-03-01 DIAGNOSIS — E119 Type 2 diabetes mellitus without complications: Secondary | ICD-10-CM | POA: Insufficient documentation

## 2014-03-01 DIAGNOSIS — Z8739 Personal history of other diseases of the musculoskeletal system and connective tissue: Secondary | ICD-10-CM | POA: Diagnosis not present

## 2014-03-01 LAB — CBG MONITORING, ED: GLUCOSE-CAPILLARY: 193 mg/dL — AB (ref 70–99)

## 2014-03-01 MED ORDER — SULFAMETHOXAZOLE-TRIMETHOPRIM 800-160 MG PO TABS: 1.0000 | ORAL_TABLET | Freq: Two times a day (BID) | ORAL | Status: DC

## 2014-03-01 NOTE — ED Provider Notes (Signed)
CSN: [id number]     Arrival date & time 03/01/14  1232 History   First MD Initiated Contact with Patient 03/01/14 1316     Chief Complaint  Patient presents with  . Skin Problem     (Consider location/radiation/quality/duration/timing/severity/associated sxs/prior Treatment) Patient is a 63 y.o. female presenting with rash. The history is provided by the patient.  Rash Location: R groin fold. Quality: painful and redness   Quality: not dry and not itchy   Pain details:    Quality:  Sharp   Severity:  Moderate   Onset quality:  Gradual   Duration:  4 days   Timing:  Constant   Progression:  Unchanged Severity:  Mild Onset quality:  Gradual Duration:  4 days Associated symptoms: no abdominal pain, no fever, no shortness of breath and not vomiting     Past Medical History  Diagnosis Date  . Thumb pain     right  . Arthritis     right anke/foot  . Allergic rhinitis   . Asthma   . HTN (hypertension)   . GERD (gastroesophageal reflux disease)   . Tendinitis   . Knee pain   . Bursitis   . Carpal tunnel syndrome   . Diabetes mellitus without complication    Past Surgical History  Procedure Laterality Date  . Uterine fibroid surgery  1988    Dr [name]  . Repeat fibroid excision  1997    Dr [name]  . Arthroscopic left knee surgery  1992  . Right heel surgery on plantar facitis  1995  . Wrist surgery     Family History  Problem Relation Age of Onset  . Uterine cancer Mother   . Hypertension Mother   . Hypertension Father   . Alzheimer's disease Father    History  Substance Use Topics  . Smoking status: Never Smoker   . Smokeless tobacco: Never Used  . Alcohol Use: No   OB History   Grav Para Term Preterm Abortions TAB SAB Ect Mult Living                 Review of Systems  Constitutional: Negative for fever.  Respiratory: Negative for cough and shortness of breath.   Gastrointestinal: Negative for vomiting and abdominal pain.  Skin: Positive for rash.  All  other systems reviewed and are negative.     Allergies  Codeine; Doxycycline; and Penicillins  Home Medications   Prior to Admission medications   Medication Sig Start Date End Date Taking? Authorizing Provider  albuterol (PROVENTIL HFA;VENTOLIN HFA) 108 (90 BASE) MCG/ACT inhaler Inhale 1-2 puffs into the lungs every 6 (six) hours as needed for wheezing. 12/28/13  Yes Domenic Moras, PA-C  amLODipine (NORVASC) 10 MG tablet Take 10 mg by mouth daily.   Yes Historical Provider, MD  cloNIDine (CATAPRES) 0.2 MG tablet Take 1 tablet (0.2 mg total) by mouth 3 (three) times daily. 10/23/12  Yes Ripudeep Krystal Eaton, MD  cyclobenzaprine (FLEXERIL) 10 MG tablet Take 10 mg by mouth 3 (three) times daily as needed for muscle spasms.    Yes Historical Provider, MD  esomeprazole (NEXIUM) 20 MG capsule Take 20 mg by mouth every morning.   Yes Historical Provider, MD  Fluticasone-Salmeterol (ADVAIR DISKUS) 100-50 MCG/DOSE AEPB Inhale 1 puff into the lungs every 12 (twelve) hours. 12/28/13  Yes Domenic Moras, PA-C  furosemide (LASIX) 40 MG tablet Take 40 mg by mouth daily.  04/22/13  Yes Tammy S Parrett, NP  glipiZIDE (GLUCOTROL XL) 10  MG 24 hr tablet Take 10 mg by mouth daily after supper.    Yes Historical Provider, MD  loratadine (CLARITIN) 10 MG tablet Take 10 mg by mouth daily.   Yes Historical Provider, MD  Menthol (BIOFREEZE) 10 % AERO Apply 1 application topically 3 (three) times daily as needed (pain).   Yes Historical Provider, MD  metFORMIN (GLUCOPHAGE) 1000 MG tablet Take 1,000 mg by mouth daily with breakfast.   Yes Historical Provider, MD  mometasone (NASONEX) 50 MCG/ACT nasal spray Place 2 sprays into the nose daily as needed (allergies).   Yes Historical Provider, MD  sulfamethoxazole-trimethoprim (SEPTRA DS) 800-160 MG per tablet Take 1 tablet by mouth 2 (two) times daily. 03/01/14   Evelina Bucy, MD   BP 143/72  Pulse 88  Temp(Src) 97.8 F (36.6 C) (Oral)  Resp 16  SpO2 98% Physical Exam  Nursing note  and vitals reviewed. Constitutional: She is oriented to person, place, and time. She appears well-developed and well-nourished. No distress.  HENT:  Head: Normocephalic and atraumatic.  Eyes: EOM are normal. Pupils are equal, round, and reactive to light.  Neck: Normal range of motion. Neck supple.  Cardiovascular: Normal rate and regular rhythm.  Exam reveals no friction rub.   No murmur heard. Pulmonary/Chest: Effort normal and breath sounds normal. No respiratory distress. She has no wheezes. She has no rales.  Abdominal: Soft. She exhibits no distension. There is no tenderness. There is no rebound.  Musculoskeletal: Normal range of motion. She exhibits no edema.  Neurological: She is alert and oriented to person, place, and time.  Skin: Rash (redness without cellulitis or induration. Foul odor from R groin fold) noted. She is not diaphoretic.    ED Course  Procedures (including critical care time) Labs Review Labs Reviewed  CBG MONITORING, ED - Abnormal; Notable for the following:    Glucose-Capillary 193 (*)    All other components within normal limits    Imaging Review No results found.   EKG Interpretation None     EMERGENCY DEPARTMENT US SOFT TISSUE INTERPRETATION "Study: Limited Ultrasound of the noted body part in comments below"  INDICATIONS: Soft tissue infection Multiple views of the body part are obtained with a multi-frequency linear probe  PERFORMED BY:  Myself  IMAGES ARCHIVED?: No  SIDE:Right   BODY PART:Other soft tisse (comment in note)  FINDINGS: No abcess noted, Cellulitis absent and Normal soft tissue ultrasound  LIMITATIONS:  Body Habitus  INTERPRETATION:  No abnormal findings noted  COMMENT:  R groin fold with [place]. No fluid collection. No hyperemia c/w cellulitis.   MDM   Final diagnoses:  Skin infection    51F here with R flank pain. R groin fold with redness, foul odor. No abscess, induration, cellulitis. Bedside US of skin without  cellulitis or abscess. No subcutaneous emphysema. No concern for necrotizing fasciitis. No concern for deeper abscess. Patient given bactrim since foul odor from wound - could be from poor hygiene since in a skin fold. Has f/u in 3 days with PCP.    Evelina Bucy, MD 03/01/14 [phone]

## 2014-03-01 NOTE — ED Notes (Addendum)
Pt c/o of RT sided flank pain. Pt states that she believes she nicked herself with a fingernail 3-4 days ago while she was getting dressed. Pt states that it stings and burns and that she has noticed "milky" drainage with redness around the site. Pt also states that the odor coming from it is a "decaying smell." Pt states she as diagnosed with Type 2 DM in April 2014. Pt states that she is taking her DM medications appropriately.

## 2014-03-06 ENCOUNTER — Ambulatory Visit: Payer: No Typology Code available for payment source

## 2014-03-06 ENCOUNTER — Encounter: Payer: Medicaid Other | Attending: Internal Medicine

## 2014-03-06 VITALS — Ht 60.5 in | Wt 232.0 lb

## 2014-03-06 DIAGNOSIS — E119 Type 2 diabetes mellitus without complications: Secondary | ICD-10-CM

## 2014-03-06 DIAGNOSIS — Z713 Dietary counseling and surveillance: Secondary | ICD-10-CM | POA: Diagnosis present

## 2014-03-06 NOTE — Progress Notes (Signed)
Patient was seen on 03/06/2014 for the first of a series of three diabetes self-management courses at [facility].  Patient Education Plan per assessed needs and concerns is to attend four course education program for Diabetes Self Management Education.  Current HbA1c: 8.5%  The following learning objectives were met by the patient during this class:  Describe diabetes  State some common risk factors for diabetes  Defines the role of glucose and insulin  Identifies type of diabetes and pathophysiology  Describe the relationship between diabetes and cardiovascular risk  State the members of the Healthcare Team  States the rationale for glucose monitoring  State when to test glucose  State their individual Target Range  State the importance of logging glucose readings  Describe how to interpret glucose readings  Identifies A1C target  Explain the correlation between A1c and eAG values  State symptoms and treatment of high blood glucose  State symptoms and treatment of low blood glucose  Explain proper technique for glucose testing  Identifies proper sharps disposal  Handouts given during class include:  Living Well with Diabetes book  Carb Counting and Meal Planning book  Meal Plan Card  Carbohydrate guide  Meal planning worksheet  Low Sodium Flavoring Tips  The diabetes portion plate  O9G to eAG Conversion Chart  Diabetes Medications  Diabetes Recommended Care Schedule  Support Group  Diabetes Success Plan  Core Class Satisfaction Survey  Follow-Up Plan:  Attend core 2

## 2014-03-13 ENCOUNTER — Ambulatory Visit: Payer: No Typology Code available for payment source

## 2014-03-13 DIAGNOSIS — E119 Type 2 diabetes mellitus without complications: Secondary | ICD-10-CM | POA: Diagnosis not present

## 2014-03-13 NOTE — Progress Notes (Signed)

## 2014-03-20 DIAGNOSIS — E119 Type 2 diabetes mellitus without complications: Secondary | ICD-10-CM

## 2014-03-20 NOTE — Progress Notes (Signed)
Patient was seen on 03/20/14 for the third of a series of three diabetes self-management courses at the [facility]. The following learning objectives were met by the patient during this class:  . State the amount of activity recommended for healthy living . Describe activities suitable for individual needs . Identify ways to regularly incorporate activity into daily life . Identify barriers to activity and ways to over come these barriers  Identify diabetes medications being personally used and their primary action for lowering glucose and possible side effects . Describe role of stress on blood glucose and develop strategies to address psychosocial issues . Identify diabetes complications and ways to prevent them  Explain how to manage diabetes during illness . Evaluate success in meeting personal goal . Establish 2-3 goals that they will plan to diligently work on until they return for the  4-month follow-up visit  Goals:  Follow Diabetes Meal Plan as instructed  Aim for 15-30 mins of physical activity daily as tolerated  Bring food record and glucose log to your follow up visit  Your patient has established the following 4 month goals in their individualized success plan: Information not available  Your patient has identified these potential barriers to change:  Information not available   Your patient has identified their diabetes self-care support plan as  [facility] Support Group  Plan:  Attend Core 4 in 4 months    

## 2014-04-10 ENCOUNTER — Ambulatory Visit (INDEPENDENT_AMBULATORY_CARE_PROVIDER_SITE_OTHER): Payer: Medicaid Other | Admitting: Pulmonary Disease

## 2014-04-10 ENCOUNTER — Telehealth: Payer: Self-pay | Admitting: Pulmonary Disease

## 2014-04-10 ENCOUNTER — Encounter: Payer: Self-pay | Admitting: Pulmonary Disease

## 2014-04-10 VITALS — BP 130/80 | HR 90 | Temp 97.0°F | Ht 61.0 in | Wt 231.6 lb

## 2014-04-10 DIAGNOSIS — J45909 Unspecified asthma, uncomplicated: Secondary | ICD-10-CM

## 2014-04-10 DIAGNOSIS — J45901 Unspecified asthma with (acute) exacerbation: Secondary | ICD-10-CM

## 2014-04-10 DIAGNOSIS — J4531 Mild persistent asthma with (acute) exacerbation: Secondary | ICD-10-CM

## 2014-04-10 DIAGNOSIS — J209 Acute bronchitis, unspecified: Secondary | ICD-10-CM

## 2014-04-10 MED ORDER — AZITHROMYCIN 250 MG PO TABS: ORAL_TABLET | ORAL | Status: DC

## 2014-04-10 MED ORDER — PREDNISONE 10 MG PO TABS: ORAL_TABLET | ORAL | Status: DC

## 2014-04-10 NOTE — Patient Instructions (Signed)
Will treat with a prednisone taper over 8 days zpak for your chest infection Stay on your advair twice a day everyday, and call if you are requiring your rescue medication more than 3 times a week. Keep your followup with Dr. Elsworth Soho

## 2014-04-10 NOTE — Assessment & Plan Note (Signed)
The patient is having head and chest congestion with a cough with purulent mucus. Will treat her with a course of antibiotics.

## 2014-04-10 NOTE — Progress Notes (Signed)
   Subjective:    Patient ID: Mariah [name], female    DOB: 06/14/1951, 63 y.o.   MRN: [id number]  HPI The patient comes in today for an acute sick visit. She has known asthma and is usually followed by Dr. Elsworth Soho. She gives a 2 week history of increasing chest congestion, cough with scant purulent mucus and a few speckles of blood. She is also had head congestion as well. She has seen some increased shortness of breath, but has not been using her rescue inhaler more frequently. He tells me that she is staying compliant on her Advair twice a day for maintenance. She denies any fevers, chills, or sweats.   Review of Systems  Constitutional: Negative for fever and unexpected weight change.  HENT: Positive for congestion, postnasal drip, rhinorrhea, sinus pressure, sore throat and trouble swallowing. Negative for dental problem, ear pain, nosebleeds and sneezing.   Eyes: Negative for redness and itching.  Respiratory: Positive for cough, chest tightness, shortness of breath and wheezing.   Cardiovascular: Negative for palpitations and leg swelling.  Gastrointestinal: Negative for nausea and vomiting.  Genitourinary: Negative for dysuria.  Musculoskeletal: Negative for joint swelling.  Skin: Negative for rash.  Neurological: Positive for headaches.  Hematological: Does not bruise/bleed easily.  Psychiatric/Behavioral: Negative for dysphoric mood. The patient is not nervous/anxious.        Objective:   Physical Exam Morbidly obese female in no acute distress Nose without purulence or discharge noted Oropharynx clear Neck without lymphadenopathy or thyromegaly Chest with diffuse rhonchi, as well as upper and lower airway wheezing. There is adequate air flow. Cardiac exam with regular rate and rhythm, 2/6 systolic murmur Lower extremities with 2+ edema, no cyanosis Alert and oriented, moves all 4 extremities.        Assessment & Plan:

## 2014-04-10 NOTE — Telephone Encounter (Signed)
Spoke with the pt  She is asking for neb tx for this pm  She is c/o increased SOB and wheezing x 2 wks  I advised will need ov  She is scheduled to see [place] this am at 10:45 am

## 2014-04-10 NOTE — Assessment & Plan Note (Signed)
The patient has active bronchospasm on exam today, and will need a short course of prednisone to get her through this. I have also stressed to her the importance of staying on her maintenance inhaler compliantly.

## 2014-06-06 ENCOUNTER — Telehealth: Payer: Self-pay | Admitting: Adult Health

## 2014-06-06 MED ORDER — ALBUTEROL SULFATE HFA 108 (90 BASE) MCG/ACT IN AERS: 1.0000 | INHALATION_SPRAY | Freq: Four times a day (QID) | RESPIRATORY_TRACT | Status: DC | PRN

## 2014-06-06 MED ORDER — FLUTICASONE-SALMETEROL 100-50 MCG/DOSE IN AEPB: 1.0000 | INHALATION_SPRAY | Freq: Two times a day (BID) | RESPIRATORY_TRACT | Status: DC

## 2014-06-06 NOTE — Telephone Encounter (Signed)
rxs were sent  Pt aware

## 2014-06-16 ENCOUNTER — Ambulatory Visit (INDEPENDENT_AMBULATORY_CARE_PROVIDER_SITE_OTHER): Payer: Medicaid Other | Admitting: Adult Health

## 2014-06-16 ENCOUNTER — Encounter: Payer: Self-pay | Admitting: Adult Health

## 2014-06-16 VITALS — BP 136/82 | HR 93 | Temp 96.7°F | Ht 61.0 in | Wt 232.2 lb

## 2014-06-16 DIAGNOSIS — J4530 Mild persistent asthma, uncomplicated: Secondary | ICD-10-CM

## 2014-06-16 DIAGNOSIS — J209 Acute bronchitis, unspecified: Secondary | ICD-10-CM

## 2014-06-16 MED ORDER — AZITHROMYCIN 250 MG PO TABS: ORAL_TABLET | ORAL | Status: AC

## 2014-06-16 MED ORDER — ALBUTEROL SULFATE HFA 108 (90 BASE) MCG/ACT IN AERS: 1.0000 | INHALATION_SPRAY | Freq: Four times a day (QID) | RESPIRATORY_TRACT | Status: DC | PRN

## 2014-06-16 NOTE — Addendum Note (Signed)
Addended by: Parke Poisson E on: 06/16/2014 05:08 PM   Modules accepted: Orders

## 2014-06-16 NOTE — Assessment & Plan Note (Signed)
Without flare    Plan  Continue on current regimen  Plan to get flu shot next week.  Follow up Dr. Elsworth Soho  In  3 months  and As needed   Please contact office for sooner follow up if symptoms do not improve or worsen or seek emergency care

## 2014-06-16 NOTE — Progress Notes (Signed)
   Subjective:    Patient ID: Mariah [name], female    DOB: November 09, 1950, 63 y.o.   MRN: [id number]  HPI 63 year old AAF Never smoker with persistent Asthma well controlled on advair -poor compliance due to financial reasons.  06/16/2014 Follow up  Returns for 3 month follow up for Asthma  Complains of bilateral ear discomfort, sinus pressure/PND, rattling/wheezing in chest, prod cough with green/yellow mucus x2 weeks.   Denies f/c/s, n/v/d, hemoptysis, chest pain , orthopnea or increased edema.    Review of Systems Constitutional:   No  weight loss, night sweats,  Fevers, chills, fatigue, or  lassitude.  HEENT:   No headaches,  Difficulty swallowing,  Tooth/dental problems, or  Sore throat,               No sneezing, itching, ear ache, +nasal congestion, post nasal drip,   CV:  No chest pain,  Orthopnea, PND, swelling in lower extremities, anasarca, dizziness, palpitations, syncope.   GI  No heartburn, indigestion, abdominal pain, nausea, vomiting, diarrhea, change in bowel habits, loss of appetite, bloody stools.   Resp:   No chest wall deformity  Skin: no rash or lesions.  GU: no dysuria, change in color of urine, no urgency or frequency.  No flank pain, no hematuria   MS:  No joint pain or swelling.  No decreased range of motion.  No back pain.  Psych:  No change in mood or affect. No depression or anxiety.  No memory loss.         Objective:   Physical Exam GEN: A/Ox3; pleasant , NAD, obese   HEENT:  Rhome/AT,  EACs-clear, TMs-wnl, NOSE-clear drainage , THROAT-clear, no lesions, no postnasal drip or exudate noted.   NECK:  Supple w/ fair ROM; no JVD; normal carotid impulses w/o bruits; no thyromegaly or nodules palpated; no lymphadenopathy.  RESP  Clear  P & A; w/o, wheezes/ rales/ or rhonchi.no accessory muscle use, no dullness to percussion  CARD:  RRR, no m/r/g  , no peripheral edema, pulses intact, no cyanosis or clubbing.  GI:   Soft & nt; nml bowel sounds; no  organomegaly or masses detected.  Musco: Warm bil, no deformities or joint swelling noted.   Neuro: alert, no focal deficits noted.    Skin: Warm, no lesions or rashes         Assessment & Plan:

## 2014-06-16 NOTE — Assessment & Plan Note (Signed)
Flare   Plan  Zpack take as directed. Mucinex DM Twice daily  As needed  Cough/congestion  Saline nasal rinses As needed   Plan to get flu shot next week.  Follow up Dr. Elsworth Soho  In  3 months  and As needed   Please contact office for sooner follow up if symptoms do not improve or worsen or seek emergency care

## 2014-06-16 NOTE — Patient Instructions (Addendum)
Zpack take as directed. Mucinex DM Twice daily  As needed  Cough/congestion  Saline nasal rinses As needed   Plan to get flu shot next week.  Follow up Dr. Elsworth Soho  In  3 months  and As needed   Please contact office for sooner follow up if symptoms do not improve or worsen or seek emergency care

## 2014-06-16 NOTE — Progress Notes (Signed)
Reviewed & agree with plan  

## 2014-06-25 ENCOUNTER — Ambulatory Visit (INDEPENDENT_AMBULATORY_CARE_PROVIDER_SITE_OTHER): Payer: Medicaid Other

## 2014-06-25 DIAGNOSIS — Z23 Encounter for immunization: Secondary | ICD-10-CM

## 2014-06-30 ENCOUNTER — Encounter: Payer: Medicaid Other | Attending: Internal Medicine

## 2014-06-30 DIAGNOSIS — Z713 Dietary counseling and surveillance: Secondary | ICD-10-CM | POA: Insufficient documentation

## 2014-06-30 DIAGNOSIS — E118 Type 2 diabetes mellitus with unspecified complications: Secondary | ICD-10-CM | POA: Diagnosis not present

## 2014-06-30 DIAGNOSIS — E1165 Type 2 diabetes mellitus with hyperglycemia: Secondary | ICD-10-CM

## 2014-06-30 DIAGNOSIS — IMO0002 Reserved for concepts with insufficient information to code with codable children: Secondary | ICD-10-CM

## 2014-06-30 NOTE — Progress Notes (Signed)
Appt start time: 0900 end time:  1000.  Patient was seen on 06/30/2014 for a review of the series of three diabetes self-management courses at the [facility]. The following learning objectives were met by the patient during this class:  . Reviewed blood glucose monitoring and interpretation including the recommended target ranges and Hgb A1c.  . Reviewed on carb counting, importance of regularly scheduled meals/snacks, and meal planning.  . Reviewed the effects of physical activity on glucose levels and long-term glucose control.  Recommended goal of 150 minutes of physical activity/week. . Reviewed patient medications and discussed role of medication on blood glucose and possible side effects. . Discussed strategies to manage stress, psychosocial issues, and other obstacles to diabetes management. . Encouraged moderate weight reduction to improve glucose levels.   . Reviewed short-term complications: hyper- and hypo-glycemia.  Discussed causes, symptoms, and treatment options. . Reviewed prevention, detection, and treatment of long-term complications.  Discussed the role of prolonged elevated glucose levels on body systems.  Goals:  Follow Diabetes Meal Plan as instructed  Eat 3 meals and 2 snacks, every 3-5 hrs  Limit carbohydrate intake to 45 grams carbohydrate/meal Limit carbohydrate intake to 15 grams carbohydrate/snack Add lean protein foods to meals/snacks  Monitor glucose levels as instructed by your doctor  Aim for goal of 15-30 mins of physical activity daily as tolerated  Bring food record and glucose log to your next nutrition visit

## 2014-07-06 ENCOUNTER — Emergency Department (HOSPITAL_COMMUNITY)
Admission: EM | Admit: 2014-07-06 | Discharge: 2014-07-06 | Disposition: A | Discharge status: Home / Self Care | Payer: Medicaid Other | Attending: Emergency Medicine | Admitting: Emergency Medicine

## 2014-07-06 ENCOUNTER — Encounter (HOSPITAL_COMMUNITY): Payer: Self-pay | Admitting: Emergency Medicine

## 2014-07-06 DIAGNOSIS — I10 Essential (primary) hypertension: Secondary | ICD-10-CM | POA: Insufficient documentation

## 2014-07-06 DIAGNOSIS — Z79899 Other long term (current) drug therapy: Secondary | ICD-10-CM | POA: Insufficient documentation

## 2014-07-06 DIAGNOSIS — M5442 Lumbago with sciatica, left side: Secondary | ICD-10-CM

## 2014-07-06 DIAGNOSIS — M545 Low back pain: Secondary | ICD-10-CM | POA: Diagnosis present

## 2014-07-06 DIAGNOSIS — E119 Type 2 diabetes mellitus without complications: Secondary | ICD-10-CM | POA: Diagnosis not present

## 2014-07-06 DIAGNOSIS — Z8669 Personal history of other diseases of the nervous system and sense organs: Secondary | ICD-10-CM | POA: Diagnosis not present

## 2014-07-06 DIAGNOSIS — M1990 Unspecified osteoarthritis, unspecified site: Secondary | ICD-10-CM | POA: Diagnosis not present

## 2014-07-06 DIAGNOSIS — J45909 Unspecified asthma, uncomplicated: Secondary | ICD-10-CM | POA: Diagnosis not present

## 2014-07-06 DIAGNOSIS — K219 Gastro-esophageal reflux disease without esophagitis: Secondary | ICD-10-CM | POA: Insufficient documentation

## 2014-07-06 DIAGNOSIS — M25552 Pain in left hip: Secondary | ICD-10-CM | POA: Insufficient documentation

## 2014-07-06 DIAGNOSIS — Z7951 Long term (current) use of inhaled steroids: Secondary | ICD-10-CM | POA: Diagnosis not present

## 2014-07-06 DIAGNOSIS — E669 Obesity, unspecified: Secondary | ICD-10-CM | POA: Insufficient documentation

## 2014-07-06 DIAGNOSIS — Z791 Long term (current) use of non-steroidal anti-inflammatories (NSAID): Secondary | ICD-10-CM | POA: Insufficient documentation

## 2014-07-06 DIAGNOSIS — Z880 Allergy status to penicillin: Secondary | ICD-10-CM | POA: Insufficient documentation

## 2014-07-06 MED ORDER — CYCLOBENZAPRINE HCL 10 MG PO TABS
5.0000 mg | ORAL_TABLET | Freq: Once | ORAL | Status: AC
  Administered 2014-07-06: 5 mg via ORAL
  Filled 2014-07-06: qty 1

## 2014-07-06 MED ORDER — KETOROLAC TROMETHAMINE 60 MG/2ML IM SOLN
60.0000 mg | Freq: Once | INTRAMUSCULAR | Status: AC
  Administered 2014-07-06: 60 mg via INTRAMUSCULAR
  Filled 2014-07-06: qty 2

## 2014-07-06 MED ORDER — CYCLOBENZAPRINE HCL 5 MG PO TABS: 5.0000 mg | ORAL_TABLET | Freq: Three times a day (TID) | ORAL | Status: DC | PRN

## 2014-07-06 MED ORDER — NAPROXEN SODIUM 220 MG PO TABS: 220.0000 mg | ORAL_TABLET | Freq: Two times a day (BID) | ORAL | Status: DC

## 2014-07-06 NOTE — Discharge Instructions (Signed)
Sciatica Sciatica is pain, weakness, numbness, or tingling along the path of the sciatic nerve. The nerve starts in the lower back and runs down the back of each leg. The nerve controls the muscles in the lower leg and in the back of the knee, while also providing sensation to the back of the thigh, lower leg, and the sole of your foot. Sciatica is a symptom of another medical condition. For instance, nerve damage or certain conditions, such as a herniated disk or bone spur on the spine, pinch or put pressure on the sciatic nerve. This causes the pain, weakness, or other sensations normally associated with sciatica. Generally, sciatica only affects one side of the body. CAUSES   Herniated or slipped disc.  Degenerative disk disease.  A pain disorder involving the narrow muscle in the buttocks (piriformis syndrome).  Pelvic injury or fracture.  Pregnancy.  Tumor (rare). SYMPTOMS  Symptoms can vary from mild to very severe. The symptoms usually travel from the low back to the buttocks and down the back of the leg. Symptoms can include:  Mild tingling or dull aches in the lower back, leg, or hip.  Numbness in the back of the calf or sole of the foot.  Burning sensations in the lower back, leg, or hip.  Sharp pains in the lower back, leg, or hip.  Leg weakness.  Severe back pain inhibiting movement. These symptoms may get worse with coughing, sneezing, laughing, or prolonged sitting or standing. Also, being overweight may worsen symptoms. DIAGNOSIS  Your caregiver will perform a physical exam to look for common symptoms of sciatica. He or she may ask you to do certain movements or activities that would trigger sciatic nerve pain. Other tests may be performed to find the cause of the sciatica. These may include:  Blood tests.  X-rays.  Imaging tests, such as an MRI or CT scan. TREATMENT  Treatment is directed at the cause of the sciatic pain. Sometimes, treatment is not necessary  and the pain and discomfort goes away on its own. If treatment is needed, your caregiver may suggest:  Over-the-counter medicines to relieve pain.  Prescription medicines, such as anti-inflammatory medicine, muscle relaxants, or narcotics.  Applying heat or ice to the painful area.  Steroid injections to lessen pain, irritation, and inflammation around the nerve.  Reducing activity during periods of pain.  Exercising and stretching to strengthen your abdomen and improve flexibility of your spine. Your caregiver may suggest losing weight if the extra weight makes the back pain worse.  Physical therapy.  Surgery to eliminate what is pressing or pinching the nerve, such as a bone spur or part of a herniated disk. HOME CARE INSTRUCTIONS   Only take over-the-counter or prescription medicines for pain or discomfort as directed by your caregiver.  Apply ice to the affected area for 20 minutes, 3-4 times a day for the first 48-72 hours. Then try heat in the same way.  Exercise, stretch, or perform your usual activities if these do not aggravate your pain.  Attend physical therapy sessions as directed by your caregiver.  Keep all follow-up appointments as directed by your caregiver.  Do not wear high heels or shoes that do not provide proper support.  Check your mattress to see if it is too soft. A firm mattress may lessen your pain and discomfort. SEEK IMMEDIATE MEDICAL CARE IF:   You lose control of your bowel or bladder (incontinence).  You have increasing weakness in the lower back, pelvis, buttocks,   or legs.  You have redness or swelling of your back.  You have a burning sensation when you urinate.  You have pain that gets worse when you lie down or awakens you at night.  Your pain is worse than you have experienced in the past.  Your pain is lasting longer than 4 weeks.  You are suddenly losing weight without reason. MAKE SURE YOU:  Understand these  instructions.  Will watch your condition.  Will get help right away if you are not doing well or get worse. Document Released: 07/05/2001 Document Revised: 01/10/2012 Document Reviewed: 11/20/2011 [employer] Patient Information 2015 [employer]. This information is not intended to replace advice given to you by your health care provider. Make sure you discuss any questions you have with your health care provider.  

## 2014-07-06 NOTE — ED Notes (Signed)
Pt reports shooting l/hip and radiating leg pain pain since 12/11. Pt reports swelling and throbbing pain in low back and  L/hip. Pain not relieved by heat.

## 2014-07-06 NOTE — ED Provider Notes (Signed)
CSN: [id number]     Arrival date & time 07/06/14  1448 History   First MD Initiated Contact with Patient 07/06/14 1510     Chief Complaint  Patient presents with  . Knee Pain    chonic knee pain  . Back Pain    low back pain  . Hip Pain    l/hip pain  . Abscess    l/hip     (Consider location/radiation/quality/duration/timing/severity/associated sxs/prior Treatment) Patient is a 63 y.o. female presenting with back pain.  Back Pain Pain location: left low back and left hip. Quality:  Shooting Radiates to: left leg. Pain severity:  Severe Onset quality:  Gradual Duration:  2 days Timing:  Constant Progression:  Worsening Chronicity:  New Context comment:  Lifting boxes from her car two days ago Relieved by: rest. Worsened by:  Movement and ambulation Ineffective treatments: acetaminophen. Associated symptoms: numbness (at baseline - hx of neuropathy)   Associated symptoms: no abdominal pain, no dysuria, no fever, no perianal numbness and no weakness     Past Medical History  Diagnosis Date  . Thumb pain     right  . Arthritis     right anke/foot  . Allergic rhinitis   . Asthma   . HTN (hypertension)   . GERD (gastroesophageal reflux disease)   . Tendinitis   . Knee pain   . Bursitis   . Carpal tunnel syndrome   . Diabetes mellitus without complication   . Obesity    Past Surgical History  Procedure Laterality Date  . Uterine fibroid surgery  1988    Dr [name]  . Repeat fibroid excision  1997    Dr [name]  . Arthroscopic left knee surgery  1992  . Right heel surgery on plantar facitis  1995  . Wrist surgery     Family History  Problem Relation Age of Onset  . Uterine cancer Mother   . Hypertension Mother   . Hypertension Father   . Alzheimer's disease Father    History  Substance Use Topics  . Smoking status: Never Smoker   . Smokeless tobacco: Never Used  . Alcohol Use: No   OB History    No data available     Review of Systems    Constitutional: Negative for fever.  Gastrointestinal: Negative for abdominal pain.  Genitourinary: Negative for dysuria.  Musculoskeletal: Positive for back pain.  Neurological: Positive for numbness (at baseline - hx of neuropathy). Negative for weakness.  All other systems reviewed and are negative.     Allergies  Codeine; Doxycycline; and Penicillins  Home Medications   Prior to Admission medications   Medication Sig Start Date End Date Taking? Authorizing Provider  albuterol (PROVENTIL HFA;VENTOLIN HFA) 108 (90 BASE) MCG/ACT inhaler Inhale 1-2 puffs into the lungs every 6 (six) hours as needed for wheezing. 06/16/14   Tammy S Parrett, NP  amLODipine (NORVASC) 10 MG tablet Take 10 mg by mouth daily.    Historical Provider, MD  cloNIDine (CATAPRES) 0.2 MG tablet Take 1 tablet (0.2 mg total) by mouth 3 (three) times daily. 10/23/12   Ripudeep Krystal Eaton, MD  cyclobenzaprine (FLEXERIL) 10 MG tablet Take 10 mg by mouth 3 (three) times daily as needed for muscle spasms.     Historical Provider, MD  esomeprazole (NEXIUM) 20 MG capsule Take 20 mg by mouth every morning.    Historical Provider, MD  Fluticasone-Salmeterol (ADVAIR DISKUS) 100-50 MCG/DOSE AEPB Inhale 1 puff into the lungs every 12 (twelve) hours. 06/06/14  Rigoberto Noel, MD  furosemide (LASIX) 40 MG tablet Take 40 mg by mouth daily.  04/22/13   Tammy S Parrett, NP  glipiZIDE (GLUCOTROL XL) 10 MG 24 hr tablet Take 10 mg by mouth daily after supper.     Historical Provider, MD  loratadine (CLARITIN) 10 MG tablet Take 10 mg by mouth daily.    Historical Provider, MD  meloxicam (MOBIC) 15 MG tablet Take 15 mg by mouth daily.    Historical Provider, MD  Menthol (BIOFREEZE) 10 % AERO Apply 1 application topically 3 (three) times daily as needed (pain).    Historical Provider, MD  metFORMIN (GLUCOPHAGE) 1000 MG tablet Take 1,000 mg by mouth daily with breakfast.    Historical Provider, MD  mometasone (NASONEX) 50 MCG/ACT nasal spray Place  2 sprays into the nose daily as needed (allergies).    Historical Provider, MD   BP 176/69 mmHg  Pulse 83  Temp(Src) 98.2 F (36.8 C) (Oral)  Resp 20  Wt 232 lb (105.235 kg)  SpO2 95%  LMP 04/08/2013 Physical Exam  Constitutional: She is oriented to person, place, and time. She appears well-developed and well-nourished. No distress.  obese  HENT:  Head: Normocephalic and atraumatic.  Eyes: Conjunctivae are normal. No scleral icterus.  Neck: Neck supple.  Cardiovascular: Normal rate and intact distal pulses.   Pulmonary/Chest: Effort normal. No stridor. No respiratory distress.  Abdominal: Normal appearance. She exhibits no distension. There is no tenderness. There is no rebound and no guarding.  Musculoskeletal:       Left hip: She exhibits normal range of motion (NV intact distally), normal strength and no deformity.       Lumbar back: She exhibits no bony tenderness.       Legs: Neurological: She is alert and oriented to person, place, and time.  Skin: Skin is warm and dry. No rash noted.  Psychiatric: She has a normal mood and affect. Her behavior is normal.  Nursing note and vitals reviewed.   ED Course  Procedures (including critical care time) Labs Review Labs Reviewed - No data to display  Imaging Review No results found.   EKG Interpretation None      MDM   Final diagnoses:  Left-sided low back pain with left-sided sciatica    63 yo female with left low back pain and left hip pain in setting of lifting boxes from her [place] two days ago.  Well appearing, nontoxic.  Pain consistent with MSK pain by history and exam.  NV intact.  Plan treatment with IM toradol, flexeril.  She will follow up with her PCP .      Artis Delay, MD 07/07/14 [phone]

## 2014-08-03 ENCOUNTER — Encounter (HOSPITAL_COMMUNITY): Payer: Self-pay

## 2014-08-03 ENCOUNTER — Emergency Department (HOSPITAL_COMMUNITY)
Admission: EM | Admit: 2014-08-03 | Discharge: 2014-08-03 | Disposition: A | Discharge status: Home / Self Care | Payer: Medicaid Other | Attending: Emergency Medicine | Admitting: Emergency Medicine

## 2014-08-03 DIAGNOSIS — I10 Essential (primary) hypertension: Secondary | ICD-10-CM | POA: Insufficient documentation

## 2014-08-03 DIAGNOSIS — M545 Low back pain: Secondary | ICD-10-CM | POA: Diagnosis not present

## 2014-08-03 DIAGNOSIS — M549 Dorsalgia, unspecified: Secondary | ICD-10-CM

## 2014-08-03 DIAGNOSIS — K219 Gastro-esophageal reflux disease without esophagitis: Secondary | ICD-10-CM | POA: Insufficient documentation

## 2014-08-03 DIAGNOSIS — W57XXXA Bitten or stung by nonvenomous insect and other nonvenomous arthropods, initial encounter: Secondary | ICD-10-CM | POA: Diagnosis not present

## 2014-08-03 DIAGNOSIS — Y9389 Activity, other specified: Secondary | ICD-10-CM | POA: Diagnosis not present

## 2014-08-03 DIAGNOSIS — Y998 Other external cause status: Secondary | ICD-10-CM | POA: Diagnosis not present

## 2014-08-03 DIAGNOSIS — J45909 Unspecified asthma, uncomplicated: Secondary | ICD-10-CM | POA: Insufficient documentation

## 2014-08-03 DIAGNOSIS — Z791 Long term (current) use of non-steroidal anti-inflammatories (NSAID): Secondary | ICD-10-CM | POA: Insufficient documentation

## 2014-08-03 DIAGNOSIS — Z79899 Other long term (current) drug therapy: Secondary | ICD-10-CM | POA: Insufficient documentation

## 2014-08-03 DIAGNOSIS — Z880 Allergy status to penicillin: Secondary | ICD-10-CM | POA: Insufficient documentation

## 2014-08-03 DIAGNOSIS — I878 Other specified disorders of veins: Secondary | ICD-10-CM | POA: Insufficient documentation

## 2014-08-03 DIAGNOSIS — M1990 Unspecified osteoarthritis, unspecified site: Secondary | ICD-10-CM | POA: Insufficient documentation

## 2014-08-03 DIAGNOSIS — S90562A Insect bite (nonvenomous), left ankle, initial encounter: Secondary | ICD-10-CM | POA: Diagnosis not present

## 2014-08-03 DIAGNOSIS — Y9289 Other specified places as the place of occurrence of the external cause: Secondary | ICD-10-CM | POA: Diagnosis not present

## 2014-08-03 DIAGNOSIS — M5430 Sciatica, unspecified side: Secondary | ICD-10-CM | POA: Insufficient documentation

## 2014-08-03 DIAGNOSIS — E669 Obesity, unspecified: Secondary | ICD-10-CM | POA: Diagnosis not present

## 2014-08-03 DIAGNOSIS — E119 Type 2 diabetes mellitus without complications: Secondary | ICD-10-CM | POA: Insufficient documentation

## 2014-08-03 HISTORY — DX: Sciatica, unspecified side: M54.30

## 2014-08-03 MED ORDER — TRAMADOL-ACETAMINOPHEN 37.5-325 MG PO TABS: 1.0000 | ORAL_TABLET | Freq: Four times a day (QID) | ORAL | Status: DC | PRN

## 2014-08-03 MED ORDER — TRAMADOL HCL 50 MG PO TABS
50.0000 mg | ORAL_TABLET | Freq: Once | ORAL | Status: AC
  Administered 2014-08-03: 50 mg via ORAL
  Filled 2014-08-03: qty 1

## 2014-08-03 MED ORDER — ACETAMINOPHEN 325 MG PO TABS
650.0000 mg | ORAL_TABLET | Freq: Once | ORAL | Status: AC
  Administered 2014-08-03: 650 mg via ORAL
  Filled 2014-08-03: qty 2

## 2014-08-03 MED ORDER — DIPHENHYDRAMINE HCL 25 MG PO CAPS
25.0000 mg | ORAL_CAPSULE | Freq: Once | ORAL | Status: AC
  Administered 2014-08-03: 25 mg via ORAL
  Filled 2014-08-03: qty 1

## 2014-08-03 NOTE — ED Provider Notes (Signed)
CSN: [id number]     Arrival date & time 08/03/14  1722 History  This chart was scribed for non-physician practitioner, Quincy Carnes, PA-C working with Tanna Furry, MD by Frederich Balding, ED scribe. This patient was seen in room [place] and the patient's care was started at 6:23 PM.   Chief Complaint  Patient presents with  . Back Pain  . Insect Bite   The history is provided by the patient. No language interpreter was used.    HPI Comments: Mariah [name] is a 64 y.o. female with history of sciatica who presents to the Emergency Department complaining of continued left lower back pain that radiates into her hip that started about one month ago after heavy lifting while moving boxes. She has been seen for the same and discharged with aleve. States it has provided no relief. Patient does have known hx of sciatica.  No numbness, paresthesias or weakness of extremities.  No loss of bowel or bladder control.  Pt is also complaining of a possible insect bite to her left ankle. Reports increased redness and swelling around the area. She has used alcohol and peroxide over the area with no relief.  Past Medical History  Diagnosis Date  . Thumb pain     right  . Arthritis     right anke/foot  . Allergic rhinitis   . Asthma   . HTN (hypertension)   . GERD (gastroesophageal reflux disease)   . Tendinitis   . Knee pain   . Bursitis   . Carpal tunnel syndrome   . Diabetes mellitus without complication   . Obesity   . Sciatica    Past Surgical History  Procedure Laterality Date  . Uterine fibroid surgery  1988    Dr [name]  . Repeat fibroid excision  1997    Dr [name]  . Arthroscopic left knee surgery  1992  . Right heel surgery on plantar facitis  1995  . Wrist surgery     Family History  Problem Relation Age of Onset  . Uterine cancer Mother   . Hypertension Mother   . Hypertension Father   . Alzheimer's disease Father    History  Substance Use Topics  . Smoking status: Never Smoker    . Smokeless tobacco: Never Used  . Alcohol Use: No   OB History    No data available     Review of Systems  Musculoskeletal: Positive for back pain and joint swelling.  Skin: Positive for color change.  All other systems reviewed and are negative.  Allergies  Codeine; Doxycycline; and Penicillins  Home Medications   Prior to Admission medications   Medication Sig Start Date End Date Taking? Authorizing Provider  albuterol (PROVENTIL HFA;VENTOLIN HFA) 108 (90 BASE) MCG/ACT inhaler Inhale 1-2 puffs into the lungs every 6 (six) hours as needed for wheezing. 06/16/14   Tammy S Parrett, NP  amLODipine (NORVASC) 10 MG tablet Take 10 mg by mouth daily.    Historical Provider, MD  cloNIDine (CATAPRES) 0.2 MG tablet Take 1 tablet (0.2 mg total) by mouth 3 (three) times daily. 10/23/12   Ripudeep Krystal Eaton, MD  cyclobenzaprine (FLEXERIL) 5 MG tablet Take 1 tablet (5 mg total) by mouth 3 (three) times daily as needed for muscle spasms. 07/06/14   Houston Siren III, MD  esomeprazole (NEXIUM) 20 MG capsule Take 20 mg by mouth every morning.    Historical Provider, MD  Fluticasone-Salmeterol (ADVAIR DISKUS) 100-50 MCG/DOSE AEPB Inhale 1 puff into the  lungs every 12 (twelve) hours. 06/06/14   Rigoberto Noel, MD  furosemide (LASIX) 40 MG tablet Take 40 mg by mouth daily.  04/22/13   Tammy S Parrett, NP  glipiZIDE (GLUCOTROL XL) 10 MG 24 hr tablet Take 10 mg by mouth daily after supper.     Historical Provider, MD  loratadine (CLARITIN) 10 MG tablet Take 10 mg by mouth daily.    Historical Provider, MD  meloxicam (MOBIC) 15 MG tablet Take 15 mg by mouth daily.    Historical Provider, MD  Menthol (BIOFREEZE) 10 % AERO Apply 1 application topically 3 (three) times daily as needed (pain).    Historical Provider, MD  metFORMIN (GLUCOPHAGE) 1000 MG tablet Take 1,000 mg by mouth daily with breakfast.    Historical Provider, MD  mometasone (NASONEX) 50 MCG/ACT nasal spray Place 2 sprays into the nose daily as  needed (allergies).    Historical Provider, MD  naproxen sodium (ALEVE) 220 MG tablet Take 1 tablet (220 mg total) by mouth 2 (two) times daily with a meal. 07/06/14   Houston Siren III, MD   BP 174/84 mmHg  Pulse 106  Temp(Src) 98.2 F (36.8 C) (Oral)  Resp 20  SpO2 98%  LMP 04/08/2013   Physical Exam  Constitutional: She is oriented to person, place, and time. She appears well-developed and well-nourished.  HENT:  Head: Normocephalic and atraumatic.  Mouth/Throat: Oropharynx is clear and moist.  Eyes: Conjunctivae and EOM are normal. Pupils are equal, round, and reactive to light.  Neck: Normal range of motion.  Cardiovascular: Normal rate, regular rhythm and normal heart sounds.   Pulmonary/Chest: Effort normal and breath sounds normal.  Musculoskeletal: Normal range of motion.       Lumbar back: She exhibits tenderness, bony tenderness and pain.       Back:  Left SI joint tender without deformity. Full ROM maintained but with some pain; normal strength and sensation of BLE; legs remain NVI  Neurological: She is alert and oriented to person, place, and time.  Skin: Skin is warm and dry.  No visible bites, erythema, swelling, or warmth to touch of left ankle.  No signs of cellulitis Chronic venous stasis of bilateral lower extremities.   Psychiatric: She has a normal mood and affect.  Nursing note and vitals reviewed.   ED Course  Procedures (including critical care time)  DIAGNOSTIC STUDIES: Oxygen Saturation is 98% on RA, normal by my interpretation.    COORDINATION OF CARE: 6:27 PM-Discussed treatment plan which includes benadryl and pain medication with pt at bedside and pt agreed to plan.   Labs Review Labs Reviewed - No data to display  Imaging Review No results found.   EKG Interpretation None      MDM   Final diagnoses:  Back pain, unspecified location  Insect bite   64 y.o. F with sciatica, here with exacerbation.  Back pain today without red  flag symptoms or focal neurologic deficits.  No visible insect bite of left ankle, no signs of infection.  Chronic venous stasis of BLE without cellulitis.  Patient has intolerance to codeine, given tramadol, tylenol, and benadryl here.  Will d/c home with same.  FU with PCP.  Discussed plan with patient, he/she acknowledged understanding and agreed with plan of care.  Return precautions given for new or worsening symptoms.  I personally performed the services described in this documentation, which was scribed in my presence. The recorded information has been reviewed and is accurate.  Lattie Haw  Chalmers Guest, PA-C 08/03/14 1921  Tanna Furry, MD 08/12/14 [phone]

## 2014-08-03 NOTE — Discharge Instructions (Signed)
Take the prescribed medication as directed. Recommend over the counter benadryl for insect bite. Follow-up with your primary care physician. Return to the ED for new or worsening symptoms.

## 2014-08-03 NOTE — ED Notes (Signed)
She c/o left-sided non-traumatic back/hip pain (has known sciatica); also c/o insect bite on left ankle.

## 2014-08-29 ENCOUNTER — Ambulatory Visit (HOSPITAL_COMMUNITY)
Admission: RE | Admit: 2014-08-29 | Discharge: 2014-08-29 | Disposition: A | Discharge status: Home / Self Care | Payer: Medicaid Other | Source: Ambulatory Visit | Attending: Primary Care | Admitting: Primary Care

## 2014-08-29 ENCOUNTER — Other Ambulatory Visit (HOSPITAL_COMMUNITY): Payer: Self-pay | Admitting: Primary Care

## 2014-08-29 DIAGNOSIS — R062 Wheezing: Secondary | ICD-10-CM | POA: Diagnosis not present

## 2014-08-29 DIAGNOSIS — R05 Cough: Secondary | ICD-10-CM | POA: Diagnosis not present

## 2014-10-08 ENCOUNTER — Ambulatory Visit: Payer: Medicaid Other | Admitting: Pulmonary Disease

## 2014-10-14 ENCOUNTER — Ambulatory Visit (INDEPENDENT_AMBULATORY_CARE_PROVIDER_SITE_OTHER): Payer: Medicaid Other | Admitting: Pulmonary Disease

## 2014-10-14 ENCOUNTER — Encounter: Payer: Self-pay | Admitting: Pulmonary Disease

## 2014-10-14 VITALS — BP 130/80 | HR 73 | Ht 61.0 in | Wt 225.0 lb

## 2014-10-14 DIAGNOSIS — R0609 Other forms of dyspnea: Secondary | ICD-10-CM | POA: Diagnosis not present

## 2014-10-14 DIAGNOSIS — J4531 Mild persistent asthma with (acute) exacerbation: Secondary | ICD-10-CM | POA: Diagnosis not present

## 2014-10-14 MED ORDER — FLUTICASONE-SALMETEROL 100-50 MCG/DOSE IN AEPB: 1.0000 | INHALATION_SPRAY | Freq: Two times a day (BID) | RESPIRATORY_TRACT | Status: DC

## 2014-10-14 MED ORDER — MOMETASONE FUROATE 50 MCG/ACT NA SUSP: 2.0000 | Freq: Every day | NASAL | Status: DC

## 2014-10-14 NOTE — Addendum Note (Signed)
Addended by: Mathis Dad on: 10/14/2014 12:37 PM   Modules accepted: Orders

## 2014-10-14 NOTE — Patient Instructions (Addendum)
Refills on nasonex, claritin & advair Saline drops in each nare Breathing test appear normal

## 2014-10-14 NOTE — Progress Notes (Signed)
   Subjective:    Patient ID: Mariah [name], female    DOB: 02/20/1951, 64 y.o.   MRN: [id number]  HPI  64 year old AAF Never smoker with persistent Asthma well controlled on advair -poor compliance due to financial reasons.  10/14/2014  Chief Complaint  Patient presents with  . Follow-up    breathing is terrible, eyes red and itchy, allergy issues.     C/o bloody discharge -when blows her nose No fevers No noct symptoms, occ wheezing worse in allergy season Compliant with advair - she has  Medicaid now  Review of Systems neg for any significant sore throat, dysphagia, itching, sneezing, nasal congestion or excess/ purulent secretions, fever, chills, sweats, unintended wt loss, pleuritic or exertional cp, hempoptysis, orthopnea pnd or change in chronic leg swelling. Also denies presyncope, palpitations, heartburn, abdominal pain, nausea, vomiting, diarrhea or change in bowel or urinary habits, dysuria,hematuria, rash, arthralgias, visual complaints, headache, numbness weakness or ataxia.     Objective:   Physical Exam  Gen. Pleasant, obese, in no distress ENT - no lesions, no post nasal drip Neck: No JVD, no thyromegaly, no carotid bruits Lungs: no use of accessory muscles, no dullness to percussion, not rales, scattered rhonchi  Cardiovascular: Rhythm regular, heart sounds  normal, no murmurs or gallops, no peripheral edema Musculoskeletal: No deformities, no cyanosis or clubbing , no tremors       Assessment & Plan:

## 2014-10-14 NOTE — Assessment & Plan Note (Signed)
Appears mild intermittent - worsened by allergies Ideally, would taper advair , but given her persistent symptoms will continue Refills on nasonex, claritin & advair Saline drops in each nare Breathing test appear normal

## 2014-10-20 ENCOUNTER — Telehealth: Payer: Self-pay | Admitting: Adult Health

## 2014-10-20 NOTE — Telephone Encounter (Signed)
Message not needed. Pt calling about a friend/spm

## 2014-11-24 ENCOUNTER — Other Ambulatory Visit (HOSPITAL_COMMUNITY): Payer: Self-pay | Admitting: Diagnostic Radiology

## 2014-11-24 DIAGNOSIS — Z1231 Encounter for screening mammogram for malignant neoplasm of breast: Secondary | ICD-10-CM

## 2014-12-05 ENCOUNTER — Emergency Department (HOSPITAL_COMMUNITY)
Admission: EM | Admit: 2014-12-05 | Discharge: 2014-12-05 | Disposition: A | Discharge status: Home / Self Care | Payer: Medicaid Other | Attending: Emergency Medicine | Admitting: Emergency Medicine

## 2014-12-05 ENCOUNTER — Encounter (HOSPITAL_COMMUNITY): Payer: Self-pay | Admitting: Emergency Medicine

## 2014-12-05 DIAGNOSIS — M436 Torticollis: Secondary | ICD-10-CM | POA: Insufficient documentation

## 2014-12-05 DIAGNOSIS — I10 Essential (primary) hypertension: Secondary | ICD-10-CM | POA: Diagnosis not present

## 2014-12-05 DIAGNOSIS — E119 Type 2 diabetes mellitus without complications: Secondary | ICD-10-CM | POA: Diagnosis not present

## 2014-12-05 DIAGNOSIS — H9201 Otalgia, right ear: Secondary | ICD-10-CM | POA: Diagnosis not present

## 2014-12-05 DIAGNOSIS — K219 Gastro-esophageal reflux disease without esophagitis: Secondary | ICD-10-CM | POA: Diagnosis not present

## 2014-12-05 DIAGNOSIS — M5430 Sciatica, unspecified side: Secondary | ICD-10-CM | POA: Insufficient documentation

## 2014-12-05 DIAGNOSIS — M19071 Primary osteoarthritis, right ankle and foot: Secondary | ICD-10-CM | POA: Insufficient documentation

## 2014-12-05 DIAGNOSIS — Z880 Allergy status to penicillin: Secondary | ICD-10-CM | POA: Diagnosis not present

## 2014-12-05 DIAGNOSIS — Z79899 Other long term (current) drug therapy: Secondary | ICD-10-CM | POA: Insufficient documentation

## 2014-12-05 DIAGNOSIS — Z7951 Long term (current) use of inhaled steroids: Secondary | ICD-10-CM | POA: Insufficient documentation

## 2014-12-05 DIAGNOSIS — M542 Cervicalgia: Secondary | ICD-10-CM | POA: Diagnosis present

## 2014-12-05 DIAGNOSIS — Z791 Long term (current) use of non-steroidal anti-inflammatories (NSAID): Secondary | ICD-10-CM | POA: Diagnosis not present

## 2014-12-05 DIAGNOSIS — J45909 Unspecified asthma, uncomplicated: Secondary | ICD-10-CM | POA: Diagnosis not present

## 2014-12-05 DIAGNOSIS — E669 Obesity, unspecified: Secondary | ICD-10-CM | POA: Insufficient documentation

## 2014-12-05 MED ORDER — DIAZEPAM 5 MG/ML IJ SOLN
2.5000 mg | Freq: Once | INTRAMUSCULAR | Status: AC
  Administered 2014-12-05: 2.5 mg via INTRAMUSCULAR
  Filled 2014-12-05: qty 2

## 2014-12-05 MED ORDER — OXYCODONE-ACETAMINOPHEN 5-325 MG PO TABS
2.0000 | ORAL_TABLET | Freq: Once | ORAL | Status: AC
  Administered 2014-12-05: 2 via ORAL
  Filled 2014-12-05 (×2): qty 2

## 2014-12-05 MED ORDER — TRAMADOL HCL 50 MG PO TABS: 50.0000 mg | ORAL_TABLET | Freq: Four times a day (QID) | ORAL | Status: DC | PRN

## 2014-12-05 MED ORDER — DIAZEPAM 5 MG PO TABS: 5.0000 mg | ORAL_TABLET | Freq: Two times a day (BID) | ORAL | Status: DC

## 2014-12-05 MED ORDER — KETOROLAC TROMETHAMINE 60 MG/2ML IM SOLN
30.0000 mg | Freq: Once | INTRAMUSCULAR | Status: AC
  Administered 2014-12-05: 30 mg via INTRAMUSCULAR
  Filled 2014-12-05: qty 2

## 2014-12-05 NOTE — ED Notes (Signed)
Pt c/o progressively worsening "crick in her neck" x 2 weeks. Pt sts she has used ice and heat with no relief. Pt A&Ox4 and ambulatory with one crutch. Pt sts that the pain shoots from the R lateral side of her neck all the way down the R side of her body. Pt sts she can't lean back or lay down without pain.

## 2014-12-05 NOTE — ED Provider Notes (Signed)
CSN: [id number]     Arrival date & time 12/05/14  2020 History  This chart was scribed for a non-physician practitioner, Antonietta Breach, PA-C working with Evelina Bucy, MD by Martinique Peace, ED Scribe. [name] was seen in [facility]. [name]'s care was started at 8:45 PM.    Chief Complaint  Mariah [name] presents with  . Neck Pain  . Otalgia    Mariah [name] is a 65 y.o. female presenting with neck pain and ear pain. The history is provided by [name]. No language interpreter was used.  Neck Pain Associated symptoms: no weakness   Otalgia Associated symptoms: neck pain   Associated symptoms: no ear discharge   HPI Comments: Mariah [name] is a 64 y.o. female who presents to the Emergency Department complaining of progressively worsening recurrent right-sided neck pain x 2 weeks that Mariah [name] describes as "spasming" with right ear pain. Mariah [name] explains Mariah [name] has a "crick" in Mariah [name] neck that prevents Mariah [name] from leaning back or laying down due to pain. Pt notes Mariah [name] has tried applying warm and cold compresses to neck without relief. Mariah [name] denies neck injury/trauma, extremity weakness, ear discharge, tinnitus, hearing loss, and fever. History of DM and Hypertension. Mariah [name] PCP is Dr. Oletta Lamas.     Past Medical History  Diagnosis Date  . Thumb pain     right  . Arthritis     right anke/foot  . Allergic rhinitis   . Asthma   . HTN (hypertension)   . GERD (gastroesophageal reflux disease)   . Tendinitis   . Knee pain   . Bursitis   . Carpal tunnel syndrome   . Diabetes mellitus without complication   . Obesity   . Sciatica    Past Surgical History  Procedure Laterality Date  . Uterine fibroid surgery  1988    Dr [name]  . Repeat fibroid excision  1997    Dr [name]  . Arthroscopic left knee surgery  1992  . Right heel surgery on plantar facitis  1995  . Wrist surgery     Family History  Problem Relation Age of Onset  . Uterine cancer Mother   . Hypertension Mother   . Hypertension Father   .  Alzheimer's disease Father    History  Substance Use Topics  . Smoking status: Never Smoker   . Smokeless tobacco: Never Used  . Alcohol Use: No   OB History    No data available      Review of Systems  HENT: Positive for ear pain. Negative for ear discharge.   Musculoskeletal: Positive for neck pain.  Neurological: Negative for weakness.  All other systems reviewed and are negative.   Allergies  Codeine; Doxycycline; Indomethacin; and Penicillins  Home Medications   Prior to Admission medications   Medication Sig Start Date End Date Taking? Authorizing Provider  albuterol (PROVENTIL HFA;VENTOLIN HFA) 108 (90 BASE) MCG/ACT inhaler Inhale 1-2 puffs into the lungs every 6 (six) hours as needed for wheezing. 06/16/14   Tammy S Parrett, NP  amLODipine (NORVASC) 10 MG tablet Take 10 mg by mouth daily.    Historical Provider, MD  cloNIDine (CATAPRES) 0.2 MG tablet Take 1 tablet (0.2 mg total) by mouth 3 (three) times daily. 10/23/12   Ripudeep Krystal Eaton, MD  cyclobenzaprine (FLEXERIL) 5 MG tablet Take 1 tablet (5 mg total) by mouth 3 (three) times daily as needed for muscle spasms. 07/06/14   Serita Grit, MD  diazepam (VALIUM) 5 MG tablet Take 1 tablet (5 mg  total) by mouth 2 (two) times daily. 12/05/14   Antonietta Breach, PA-C  esomeprazole (NEXIUM) 20 MG capsule Take 20 mg by mouth every morning.    Historical Provider, MD  Fluticasone-Salmeterol (ADVAIR DISKUS) 100-50 MCG/DOSE AEPB Inhale 1 puff into the lungs every 12 (twelve) hours. 06/06/14   Rigoberto Noel, MD  Fluticasone-Salmeterol (ADVAIR DISKUS) 100-50 MCG/DOSE AEPB Inhale 1 puff into the lungs 2 (two) times daily. 10/14/14   Rigoberto Noel, MD  furosemide (LASIX) 40 MG tablet Take 40 mg by mouth daily.  04/22/13   Tammy S Parrett, NP  glipiZIDE (GLUCOTROL XL) 10 MG 24 hr tablet Take 10 mg by mouth daily after supper.     Historical Provider, MD  Indomethacin 20 MG CAPS Take 1 tablet by mouth daily.    Historical Provider, MD  loratadine  (CLARITIN) 10 MG tablet Take 10 mg by mouth daily.    Historical Provider, MD  meloxicam (MOBIC) 15 MG tablet Take 15 mg by mouth daily.    Historical Provider, MD  Menthol (BIOFREEZE) 10 % AERO Apply 1 application topically 3 (three) times daily as needed (pain).    Historical Provider, MD  metFORMIN (GLUCOPHAGE) 1000 MG tablet Take 1,000 mg by mouth daily with breakfast.    Historical Provider, MD  mometasone (NASONEX) 50 MCG/ACT nasal spray Place 2 sprays into the nose daily as needed (allergies).    Historical Provider, MD  mometasone (NASONEX) 50 MCG/ACT nasal spray Place 2 sprays into the nose daily. 10/14/14   Rigoberto Noel, MD  naproxen sodium (ALEVE) 220 MG tablet Take 1 tablet (220 mg total) by mouth 2 (two) times daily with a meal. 07/06/14   Serita Grit, MD  traMADol (ULTRAM) 50 MG tablet Take 1 tablet (50 mg total) by mouth every 6 (six) hours as needed. 12/05/14   Antonietta Breach, PA-C  traMADol-acetaminophen (ULTRACET) 37.5-325 MG per tablet Take 1 tablet by mouth every 6 (six) hours as needed. 08/03/14   Larene Pickett, PA-C   BP 153/83 mmHg  Pulse 90  Temp(Src) 98.4 F (36.9 C) (Oral)  Resp 16  SpO2 98%  LMP 04/08/2013  Physical Exam  Constitutional: Mariah [name] is oriented to person, place, and time. Mariah [name] appears well-developed and well-nourished. No distress.  Nontoxic/nonseptic appearing  HENT:  Head: Normocephalic and atraumatic.  Eyes: Conjunctivae and EOM are normal. No scleral icterus.  Neck:  Decreased active range of motion secondary to pain. There is a significant spasm to the right sternocleidomastoid muscle. No tenderness to palpation to the cervical midline. No bony deformity, step-offs, or crepitus.  Cardiovascular: Normal rate, regular rhythm and intact distal pulses.   Distal radial pulse 2+ bilaterally  Pulmonary/Chest: Effort normal. No respiratory distress.  Respirations even and unlabored  Musculoskeletal: Normal range of motion.  Neurological: Mariah [name] is alert and  oriented to person, place, and time. Mariah [name] exhibits normal muscle tone. Coordination normal.  Sensation to light touch intact in bilateral upper extremities. Grip strength 5/5. Strength against resistance in bilateral upper extremities is also 5/5.  Skin: Skin is warm and dry. No rash noted. Mariah [name] is not diaphoretic. No erythema. No pallor.  Psychiatric: Mariah [name] has a normal mood and affect. Mariah [name] behavior is normal.  Nursing note and vitals reviewed.   ED Course  Procedures (including critical care time) Labs Review Labs Reviewed - No data to display  Imaging Review No results found.   EKG Interpretation None     Medications  diazepam (VALIUM) injection 2.5 mg (2.5  mg Intramuscular Given 12/05/14 2109)  ketorolac (TORADOL) injection 30 mg (30 mg Intramuscular Given 12/05/14 2109)  oxyCODONE-acetaminophen (PERCOCET/ROXICET) 5-325 MG per tablet 2 tablet (2 tablets Oral Given 12/05/14 2109)    8:50 PM- Treatment plan was discussed with Mariah [name] who verbalizes understanding and agrees.   MDM   Final diagnoses:  Torticollis, acute    64 year old female presents to the emergency department for further evaluation of right sided neck pain. Symptoms and physical exam consistent with acute torticollis. Mariah [name] is neurovascularly intact. No history of neck injury. Strength and sensation is normal and equal in bilateral upper extremities. Mariah [name] is afebrile; no nuchal rigidity or meningismus to suggest meningitis. Mariah [name] treated in ED with Toradol and Valium as well as Percocet. Will discharge with muscle relaxer and tramadol. Primary care follow up advised and return precautions given. Mariah [name] agreeable to plan with no unaddressed concerns.  I personally performed the services described in this documentation, which was scribed in my presence. The recorded information has been reviewed and is accurate.   Filed Vitals:   12/05/14 2030  BP: 153/83  Pulse: 90  Temp: 98.4 F (36.9 C)  TempSrc: Oral   Resp: 16  SpO2: 98%      Antonietta Breach, PA-C 12/05/14 1021  Evelina Bucy, MD 12/05/14 [phone]

## 2014-12-05 NOTE — Discharge Instructions (Signed)

## 2014-12-19 ENCOUNTER — Emergency Department (HOSPITAL_COMMUNITY)
Admission: EM | Admit: 2014-12-19 | Discharge: 2014-12-19 | Disposition: A | Discharge status: Home / Self Care | Payer: Medicaid Other | Attending: Emergency Medicine | Admitting: Emergency Medicine

## 2014-12-19 ENCOUNTER — Encounter (HOSPITAL_COMMUNITY): Payer: Self-pay | Admitting: Emergency Medicine

## 2014-12-19 DIAGNOSIS — Z8669 Personal history of other diseases of the nervous system and sense organs: Secondary | ICD-10-CM | POA: Insufficient documentation

## 2014-12-19 DIAGNOSIS — K219 Gastro-esophageal reflux disease without esophagitis: Secondary | ICD-10-CM | POA: Diagnosis not present

## 2014-12-19 DIAGNOSIS — Z79899 Other long term (current) drug therapy: Secondary | ICD-10-CM | POA: Diagnosis not present

## 2014-12-19 DIAGNOSIS — M542 Cervicalgia: Secondary | ICD-10-CM | POA: Diagnosis present

## 2014-12-19 DIAGNOSIS — E669 Obesity, unspecified: Secondary | ICD-10-CM | POA: Diagnosis not present

## 2014-12-19 DIAGNOSIS — Z791 Long term (current) use of non-steroidal anti-inflammatories (NSAID): Secondary | ICD-10-CM | POA: Insufficient documentation

## 2014-12-19 DIAGNOSIS — M62838 Other muscle spasm: Secondary | ICD-10-CM | POA: Diagnosis not present

## 2014-12-19 DIAGNOSIS — Z880 Allergy status to penicillin: Secondary | ICD-10-CM | POA: Diagnosis not present

## 2014-12-19 DIAGNOSIS — Z7951 Long term (current) use of inhaled steroids: Secondary | ICD-10-CM | POA: Insufficient documentation

## 2014-12-19 DIAGNOSIS — M1990 Unspecified osteoarthritis, unspecified site: Secondary | ICD-10-CM | POA: Insufficient documentation

## 2014-12-19 DIAGNOSIS — E119 Type 2 diabetes mellitus without complications: Secondary | ICD-10-CM | POA: Insufficient documentation

## 2014-12-19 DIAGNOSIS — Z760 Encounter for issue of repeat prescription: Secondary | ICD-10-CM | POA: Insufficient documentation

## 2014-12-19 DIAGNOSIS — I10 Essential (primary) hypertension: Secondary | ICD-10-CM | POA: Insufficient documentation

## 2014-12-19 DIAGNOSIS — J45909 Unspecified asthma, uncomplicated: Secondary | ICD-10-CM | POA: Diagnosis not present

## 2014-12-19 MED ORDER — KETOROLAC TROMETHAMINE 60 MG/2ML IM SOLN
30.0000 mg | Freq: Once | INTRAMUSCULAR | Status: AC
  Administered 2014-12-19: 30 mg via INTRAMUSCULAR
  Filled 2014-12-19: qty 2

## 2014-12-19 MED ORDER — TRAMADOL HCL 50 MG PO TABS: 50.0000 mg | ORAL_TABLET | Freq: Four times a day (QID) | ORAL | Status: DC | PRN

## 2014-12-19 MED ORDER — CELECOXIB 50 MG PO CAPS: 50.0000 mg | ORAL_CAPSULE | Freq: Two times a day (BID) | ORAL | Status: DC

## 2014-12-19 MED ORDER — DIAZEPAM 5 MG PO TABS: 5.0000 mg | ORAL_TABLET | Freq: Two times a day (BID) | ORAL | Status: DC

## 2014-12-19 MED ORDER — DIAZEPAM 5 MG/ML IJ SOLN
2.5000 mg | Freq: Once | INTRAMUSCULAR | Status: DC
  Filled 2014-12-19: qty 2

## 2014-12-19 MED ORDER — DIAZEPAM 5 MG/ML IJ SOLN
2.5000 mg | Freq: Once | INTRAMUSCULAR | Status: AC
  Administered 2014-12-19: 2.5 mg via INTRAMUSCULAR

## 2014-12-19 NOTE — Discharge Instructions (Signed)

## 2014-12-19 NOTE — ED Notes (Signed)
Pt A+Ox4, reports "here for follow up" of R sided neck pain, reports supposed to follow up with pain clinic but "I'm on standby and they haven't called me".  Pt also reports needing medication refill for celebrex.  Pt reports pain to R side of neck "all the way down to my leg".  Pt reports "crackles and pops" in neck.  10/10 pain, reports "gets greater at night".  Pt denies n/t to extremities, denies b/b changes or complaints.  MAEI.  Ambulatory with slow steady gait with cane which pt reports is baseline.  Speaking full/clear sentences.  NAD.

## 2014-12-19 NOTE — ED Provider Notes (Signed)
CSN: [id number]     Arrival date & time 12/19/14  1028 History   First MD Initiated Contact with Patient 12/19/14 1051     Chief Complaint  Patient presents with  . Neck Pain    pt sts "here for follow up" for R sided neck pain "popping and cracking in there"  . Medication Refill    needs celebrex, sts called PCP "but it's not filled yet"     (Consider location/radiation/quality/duration/timing/severity/associated sxs/prior Treatment) HPI    PCP: PROVIDER NOT IN SYSTEM Blood pressure 138/67, pulse 87, temperature 98.7 F (37.1 C), temperature source Oral, resp. rate 20, height 5\' 1"  (1.549 m), weight 225 lb (102.059 kg), last menstrual period 04/08/2013, SpO2 100 %.  Mariah [name] is a 64 y.o.female with a significant PMH of rhinitis, hypertension, asthma, tendinitis, knee pain, diabetes, sciatica, neck pain presents to the ER with complaints of wanting a recheck for her right neck pain and a refill of her Celebrex. She was seen here on May 13 for the same problem after having pain for 1-2 weeks. She is having difficulty getting into her clinic and continues to have trouble. She was supposed to have an appointment today and canceled it to come to the ER instead. That appointment has been rescheduled for June 22. She reports her neck is significantly better then when she was here a few weeks ago but she still has tightness to the neck, feels crackling and popping. She reports pain when moving her neck certain ways. She's been using hot and cold presses which have been helping. She is given a prescription for Toradol and Valium which she has used to completion, she request refills. Denies weakness to the right arm, left arm, lower extremity. No numbness or tingling associated. No fevers, weight loss, headaches, change in vision, aphasia, ataxia or any other symptoms.      Past Medical History  Diagnosis Date  . Thumb pain     right  . Arthritis     right anke/foot  . Allergic rhinitis    . Asthma   . HTN (hypertension)   . GERD (gastroesophageal reflux disease)   . Tendinitis   . Knee pain   . Bursitis   . Carpal tunnel syndrome   . Diabetes mellitus without complication   . Obesity   . Sciatica    Past Surgical History  Procedure Laterality Date  . Uterine fibroid surgery  1988    Dr [name]  . Repeat fibroid excision  1997    Dr [name]  . Arthroscopic left knee surgery  1992  . Right heel surgery on plantar facitis  1995  . Wrist surgery     Family History  Problem Relation Age of Onset  . Uterine cancer Mother   . Hypertension Mother   . Hypertension Father   . Alzheimer's disease Father    History  Substance Use Topics  . Smoking status: Never Smoker   . Smokeless tobacco: Never Used  . Alcohol Use: No   OB History    No data available     Review of Systems  10 Systems reviewed and are negative for acute change except as noted in the HPI.     Allergies  Codeine; Doxycycline; Indomethacin; and Penicillins  Home Medications   Prior to Admission medications   Medication Sig Start Date End Date Taking? Authorizing Provider  albuterol (PROVENTIL HFA;VENTOLIN HFA) 108 (90 BASE) MCG/ACT inhaler Inhale 1-2 puffs into the lungs every 6 (six)  hours as needed for wheezing. 06/16/14   Tammy S Parrett, NP  amLODipine (NORVASC) 10 MG tablet Take 10 mg by mouth daily.    Historical Provider, MD  celecoxib (CELEBREX) 50 MG capsule Take 1 capsule (50 mg total) by mouth 2 (two) times daily. 12/19/14   Delos Haring, PA-C  cloNIDine (CATAPRES) 0.2 MG tablet Take 1 tablet (0.2 mg total) by mouth 3 (three) times daily. 10/23/12   Ripudeep Krystal Eaton, MD  cyclobenzaprine (FLEXERIL) 5 MG tablet Take 1 tablet (5 mg total) by mouth 3 (three) times daily as needed for muscle spasms. 07/06/14   Serita Grit, MD  diazepam (VALIUM) 5 MG tablet Take 1 tablet (5 mg total) by mouth 2 (two) times daily. 12/19/14   Cheyne Boulden Carlota Raspberry, PA-C  esomeprazole (NEXIUM) 20 MG capsule Take 20 mg  by mouth every morning.    Historical Provider, MD  Fluticasone-Salmeterol (ADVAIR DISKUS) 100-50 MCG/DOSE AEPB Inhale 1 puff into the lungs every 12 (twelve) hours. 06/06/14   Rigoberto Noel, MD  Fluticasone-Salmeterol (ADVAIR DISKUS) 100-50 MCG/DOSE AEPB Inhale 1 puff into the lungs 2 (two) times daily. 10/14/14   Rigoberto Noel, MD  furosemide (LASIX) 40 MG tablet Take 40 mg by mouth daily.  04/22/13   Tammy S Parrett, NP  glipiZIDE (GLUCOTROL XL) 10 MG 24 hr tablet Take 10 mg by mouth daily after supper.     Historical Provider, MD  Indomethacin 20 MG CAPS Take 1 tablet by mouth daily.    Historical Provider, MD  loratadine (CLARITIN) 10 MG tablet Take 10 mg by mouth daily.    Historical Provider, MD  meloxicam (MOBIC) 15 MG tablet Take 15 mg by mouth daily.    Historical Provider, MD  Menthol (BIOFREEZE) 10 % AERO Apply 1 application topically 3 (three) times daily as needed (pain).    Historical Provider, MD  metFORMIN (GLUCOPHAGE) 1000 MG tablet Take 1,000 mg by mouth daily with breakfast.    Historical Provider, MD  mometasone (NASONEX) 50 MCG/ACT nasal spray Place 2 sprays into the nose daily as needed (allergies).    Historical Provider, MD  mometasone (NASONEX) 50 MCG/ACT nasal spray Place 2 sprays into the nose daily. 10/14/14   Rigoberto Noel, MD  naproxen sodium (ALEVE) 220 MG tablet Take 1 tablet (220 mg total) by mouth 2 (two) times daily with a meal. 07/06/14   Serita Grit, MD  traMADol (ULTRAM) 50 MG tablet Take 1 tablet (50 mg total) by mouth every 6 (six) hours as needed. 12/19/14   Delos Haring, PA-C  traMADol-acetaminophen (ULTRACET) 37.5-325 MG per tablet Take 1 tablet by mouth every 6 (six) hours as needed. 08/03/14   Larene Pickett, PA-C   BP 138/67 mmHg  Pulse 87  Temp(Src) 98.7 F (37.1 C) (Oral)  Resp 20  Ht 5\' 1"  (1.549 m)  Wt 225 lb (102.059 kg)  BMI 42.54 kg/m2  SpO2 100%  LMP 04/08/2013 Physical Exam  Constitutional: She appears well-developed and well-nourished.  No distress.  HENT:  Head: Normocephalic and atraumatic.  Eyes: Pupils are equal, round, and reactive to light.  Neck: Normal range of motion. Neck supple. Muscular tenderness present. No spinous process tenderness present. Normal range of motion present.    Pt has equal strength to bilateral upper extremities.  Neurosensory function adequate to both arms Skin color is normal. Skin is warm and moist.  I see no step off deformity, no midline bony tenderness to  Cervical spine  No crepitus, laceration, effusion,  induration, lesions, swelling.   radial pulses are symmetrical and palpable bilaterally  Spasm of the right side neck muscle   Cardiovascular: Normal rate and regular rhythm.   Pulmonary/Chest: Effort normal.  Abdominal: Soft.  Neurological: She is alert.  Skin: Skin is warm and dry.  Nursing note and vitals reviewed.   ED Course  Procedures (including critical care time) Labs Review Labs Reviewed - No data to display  Imaging Review No results found.   EKG Interpretation None      MDM   Final diagnoses:  Muscle spasms of neck    Medications  ketorolac (TORADOL) injection 30 mg (30 mg Intramuscular Given 12/19/14 1114)  diazepam (VALIUM) injection 2.5 mg (2.5 mg Intramuscular Given 12/19/14 4563)   64 year old female presents to the emergency department for further evaluation of right sided neck pain, recheck, that she has been having for a few weeks now. Symptoms and physical exam consistent with cervical muscle spasms, the are much improved since last visit.   . No neurological deficits and normal neuro exam. Patient can walk. No loss of bowel or bladder control. No concern for cauda equina at this time base on HPI and physical exam findings. No fever, night sweats, weight loss, h/o cancer, IVDU.   Patient Plan 1. Medications: Valium and Tramadol refilled, as well as Celebrex. Cont usual home medications unless otherwise directed. 2. Treatment: rest, drink  plenty of fluids, gentle stretching as discussed, alternate ice and heat  3. Follow Up: Please followup with your primary doctor for discussion of your diagnoses and further evaluation after today's visit; if you do not have a primary care doctor use the resource guide provided to find one  Advised to follow-up with the orthopedist if symptoms do not start to resolve in the next 2-3 days. If develop loss of bowel or urinary control return to the ED as soon as possible for further evaluation. To take the medications as prescribed as they can cause harm if not taken appropriately.   Vital signs are stable at discharge. Filed Vitals:   12/19/14 1037  BP: 138/67  Pulse: 87  Temp: 98.7 F (37.1 C)  Resp: 20    Patient/guardian has voiced understanding and agreed to follow-up with the PCP or specialist.       Delos Haring, PA-C 12/19/14 [place], MD 12/19/14 [id number]

## 2015-01-15 ENCOUNTER — Other Ambulatory Visit (HOSPITAL_COMMUNITY): Payer: Self-pay | Admitting: Diagnostic Radiology

## 2015-01-15 ENCOUNTER — Ambulatory Visit (HOSPITAL_COMMUNITY)
Admission: RE | Admit: 2015-01-15 | Discharge: 2015-01-15 | Disposition: A | Discharge status: Home / Self Care | Payer: Medicaid Other | Source: Ambulatory Visit | Attending: Diagnostic Radiology | Admitting: Diagnostic Radiology

## 2015-01-15 DIAGNOSIS — Z1231 Encounter for screening mammogram for malignant neoplasm of breast: Secondary | ICD-10-CM

## 2015-02-13 ENCOUNTER — Ambulatory Visit (INDEPENDENT_AMBULATORY_CARE_PROVIDER_SITE_OTHER): Payer: Medicaid Other | Admitting: Adult Health

## 2015-02-13 ENCOUNTER — Encounter: Payer: Self-pay | Admitting: Adult Health

## 2015-02-13 VITALS — BP 138/72 | HR 89 | Temp 98.0°F | Ht 61.0 in | Wt 235.0 lb

## 2015-02-13 DIAGNOSIS — J4530 Mild persistent asthma, uncomplicated: Secondary | ICD-10-CM

## 2015-02-13 DIAGNOSIS — J309 Allergic rhinitis, unspecified: Secondary | ICD-10-CM

## 2015-02-13 NOTE — Assessment & Plan Note (Signed)
Encouraged on weight loss

## 2015-02-13 NOTE — Assessment & Plan Note (Signed)
Compensated on present regimen No changes in her current regimen

## 2015-02-13 NOTE — Progress Notes (Signed)
   Subjective:    Patient ID: Mariah [name], female    DOB: [birth year], 64 y.o.   MRN: [id number]  HPI 64 year old AAF Never smoker with persistent Asthma well controlled on advair -hx poor compliance due to financial reasons.    02/13/2015 Follow up : Asthma  Returns for 4 month follow up . Patient says overall her asthma is doing okay. She still has difficulty affording her medications even though she has Medicaid. She has no significant financial source of income, Patient remains on Advair twice daily. She denies any chest pain, orthopnea, PND, hemoptysis. Chest x-ray earlier this year with no acute process. Says she continues to have significant joint pain especially in her knees , hands and feet.     Review of Systems neg for any significant sore throat, dysphagia, itching, sneezing, nasal congestion or excess/ purulent secretions, fever, chills, sweats, unintended wt loss, pleuritic or exertional cp, hempoptysis, orthopnea pnd or change in chronic leg swelling. Also denies presyncope, palpitations, heartburn, abdominal pain, nausea, vomiting, diarrhea or change in bowel or urinary habits, dysuria,hematuria, rash, arthralgias, visual complaints, headache, numbness weakness or ataxia.     Objective:   Physical Exam  Gen. Pleasant, obese, in no distress  ENT - no lesions, no post nasal drip Neck: No JVD, no thyromegaly, no carotid bruits Lungs: no use of accessory muscles, no dullness to percussion , CTA , no wheezing  Cardiovascular: Rhythm regular, heart sounds  normal, no murmurs or gallops, tr-1+ peripheral edema-VI changes  Musculoskeletal: No deformities, no cyanosis or clubbing , no tremors       Assessment & Plan:

## 2015-02-13 NOTE — Patient Instructions (Signed)
Continue on current regimen .  Saline nasal rinses As needed   Follow up Dr. Elsworth Soho  In  4 months and As needed   Please contact office for sooner follow up if symptoms do not improve or worsen or seek emergency care

## 2015-02-13 NOTE — Assessment & Plan Note (Signed)
Continue current regimen. Add in saline nasal rinses as needed

## 2015-02-17 NOTE — Progress Notes (Signed)
Reviewed & agree with plan  

## 2015-04-19 ENCOUNTER — Emergency Department (HOSPITAL_COMMUNITY)
Admission: EM | Admit: 2015-04-19 | Discharge: 2015-04-19 | Disposition: A | Discharge status: Home / Self Care | Payer: Medicaid Other | Attending: Emergency Medicine | Admitting: Emergency Medicine

## 2015-04-19 ENCOUNTER — Encounter (HOSPITAL_COMMUNITY): Payer: Self-pay | Admitting: Emergency Medicine

## 2015-04-19 DIAGNOSIS — Z880 Allergy status to penicillin: Secondary | ICD-10-CM | POA: Insufficient documentation

## 2015-04-19 DIAGNOSIS — Z79899 Other long term (current) drug therapy: Secondary | ICD-10-CM | POA: Insufficient documentation

## 2015-04-19 DIAGNOSIS — I878 Other specified disorders of veins: Secondary | ICD-10-CM | POA: Insufficient documentation

## 2015-04-19 DIAGNOSIS — I10 Essential (primary) hypertension: Secondary | ICD-10-CM | POA: Diagnosis not present

## 2015-04-19 DIAGNOSIS — Z8669 Personal history of other diseases of the nervous system and sense organs: Secondary | ICD-10-CM | POA: Diagnosis not present

## 2015-04-19 DIAGNOSIS — K219 Gastro-esophageal reflux disease without esophagitis: Secondary | ICD-10-CM | POA: Diagnosis not present

## 2015-04-19 DIAGNOSIS — E669 Obesity, unspecified: Secondary | ICD-10-CM | POA: Insufficient documentation

## 2015-04-19 DIAGNOSIS — M1990 Unspecified osteoarthritis, unspecified site: Secondary | ICD-10-CM | POA: Diagnosis not present

## 2015-04-19 DIAGNOSIS — Z7951 Long term (current) use of inhaled steroids: Secondary | ICD-10-CM | POA: Diagnosis not present

## 2015-04-19 DIAGNOSIS — E119 Type 2 diabetes mellitus without complications: Secondary | ICD-10-CM | POA: Insufficient documentation

## 2015-04-19 DIAGNOSIS — J45909 Unspecified asthma, uncomplicated: Secondary | ICD-10-CM | POA: Insufficient documentation

## 2015-04-19 DIAGNOSIS — M7989 Other specified soft tissue disorders: Secondary | ICD-10-CM

## 2015-04-19 LAB — CBG MONITORING, ED: GLUCOSE-CAPILLARY: 143 mg/dL — AB (ref 65–99)

## 2015-04-19 NOTE — ED Provider Notes (Signed)
CSN: [id number]     Arrival date & time 04/19/15  1948 History   First MD Initiated Contact with Patient 04/19/15 2215     Chief Complaint  Patient presents with  . Leg Swelling     (Consider location/radiation/quality/duration/timing/severity/associated sxs/prior Treatment) HPI Comments: Patient with past medical history remarkable for diabetes, morbid obesity, hypertension, presents to the emergency department with chief complaint of bilateral lower extremity swelling. She states that she normally keeps fluid on her legs, but has noticed that the swelling has increased. She states that her legs might appear a little more red than normal. She states that her neighbor who is a CNA, told her that she needed to be evaluated for blood clots. She denies any chest pain or shortness of breath. She states that she has had some congestion in her chest from allergies. She states that she is taking 80 mg of Lasix daily. She states that she has not been wearing her compression stockings. There are no aggravating or alleviating factors.  The history is provided by the patient. No language interpreter was used.    Past Medical History  Diagnosis Date  . Thumb pain     right  . Arthritis     right anke/foot  . Allergic rhinitis   . Asthma   . HTN (hypertension)   . GERD (gastroesophageal reflux disease)   . Tendinitis   . Knee pain   . Bursitis   . Carpal tunnel syndrome   . Diabetes mellitus without complication   . Obesity   . Sciatica    Past Surgical History  Procedure Laterality Date  . Uterine fibroid surgery  1988    Dr [name]  . Repeat fibroid excision  1997    Dr [name]  . Arthroscopic left knee surgery  1992  . Right heel surgery on plantar facitis  1995  . Wrist surgery     Family History  Problem Relation Age of Onset  . Uterine cancer Mother   . Hypertension Mother   . Hypertension Father   . Alzheimer's disease Father   . Cancer Other    Social History  Substance Use  Topics  . Smoking status: Never Smoker   . Smokeless tobacco: Never Used  . Alcohol Use: No   OB History    No data available     Review of Systems  Constitutional: Negative for fever and chills.  Respiratory: Negative for shortness of breath.   Cardiovascular: Negative for chest pain.  Gastrointestinal: Negative for nausea, vomiting, diarrhea and constipation.  Genitourinary: Negative for dysuria.  All other systems reviewed and are negative.     Allergies  Codeine; Doxycycline; Indomethacin; and Penicillins  Home Medications   Prior to Admission medications   Medication Sig Start Date End Date Taking? Authorizing Provider  albuterol (PROVENTIL HFA;VENTOLIN HFA) 108 (90 BASE) MCG/ACT inhaler Inhale 1-2 puffs into the lungs every 6 (six) hours as needed for wheezing. 06/16/14  Yes Tammy S Parrett, NP  amLODipine (NORVASC) 10 MG tablet Take 10 mg by mouth daily.   Yes Historical Provider, MD  celecoxib (CELEBREX) 50 MG capsule Take 1 capsule (50 mg total) by mouth 2 (two) times daily. 12/19/14  Yes Tiffany Carlota Raspberry, PA-C  cetirizine (ZYRTEC) 10 MG tablet Take 10 mg by mouth daily.   Yes Historical Provider, MD  cloNIDine (CATAPRES) 0.2 MG tablet Take 1 tablet (0.2 mg total) by mouth 3 (three) times daily. 10/23/12  Yes Ripudeep Krystal Eaton, MD  cyclobenzaprine (FLEXERIL)  10 MG tablet Take 10 mg by mouth 3 (three) times daily as needed for muscle spasms.   Yes Historical Provider, MD  esomeprazole (NEXIUM) 20 MG capsule Take 20 mg by mouth every morning.   Yes Historical Provider, MD  Fluticasone-Salmeterol (ADVAIR DISKUS) 100-50 MCG/DOSE AEPB Inhale 1 puff into the lungs 2 (two) times daily. 10/14/14  Yes Rigoberto Noel, MD  furosemide (LASIX) 40 MG tablet Take 40 mg by mouth daily.  04/22/13  Yes Tammy S Parrett, NP  glipiZIDE (GLUCOTROL XL) 10 MG 24 hr tablet Take 10 mg by mouth daily after supper.    Yes Historical Provider, MD  Menthol (BIOFREEZE) 10 % AERO Apply 1 application topically 3  (three) times daily as needed (pain).   Yes Historical Provider, MD  metFORMIN (GLUCOPHAGE-XR) 500 MG 24 hr tablet Take 1,000 mg by mouth 2 (two) times daily.   Yes Historical Provider, MD  traMADol (ULTRAM) 50 MG tablet Take 50 mg by mouth every 6 (six) hours as needed.   Yes Historical Provider, MD  cyclobenzaprine (FLEXERIL) 5 MG tablet Take 1 tablet (5 mg total) by mouth 3 (three) times daily as needed for muscle spasms. Patient not taking: Reported on 02/13/2015 07/06/14   Serita Grit, MD  mometasone (NASONEX) 50 MCG/ACT nasal spray Place 2 sprays into the nose daily. Patient not taking: Reported on 04/19/2015 10/14/14   Rigoberto Noel, MD  naproxen sodium (ALEVE) 220 MG tablet Take 1 tablet (220 mg total) by mouth 2 (two) times daily with a meal. Patient not taking: Reported on 04/19/2015 07/06/14   Serita Grit, MD   BP 182/91 mmHg  Pulse 92  Temp(Src) 97.9 F (36.6 C) (Oral)  Resp 18  Ht 5\' 1"  (1.549 m)  Wt 225 lb (102.059 kg)  BMI 42.54 kg/m2  SpO2 100%  LMP 04/08/2013 Physical Exam  Constitutional: She is oriented to person, place, and time. She appears well-developed and well-nourished.  HENT:  Head: Normocephalic and atraumatic.  Eyes: Conjunctivae and EOM are normal. Pupils are equal, round, and reactive to light.  Neck: Normal range of motion. Neck supple.  Cardiovascular: Normal rate and regular rhythm.  Exam reveals no gallop and no friction rub.   No murmur heard. Pulmonary/Chest: Effort normal and breath sounds normal. No respiratory distress. She has no wheezes. She has no rales. She exhibits no tenderness.  Abdominal: Soft. Bowel sounds are normal. She exhibits no distension and no mass. There is no tenderness. There is no rebound and no guarding.  Musculoskeletal: Normal range of motion. She exhibits edema. She exhibits no tenderness.  Bilateral lower extremity peripheral edema, no calf tenderness  Neurological: She is alert and oriented to person, place, and time.   Skin: Skin is warm and dry.  No erythema or signs of cellulitis, no abscess  Psychiatric: She has a normal mood and affect. Her behavior is normal. Judgment and thought content normal.  Nursing note and vitals reviewed.   ED Course  Procedures (including critical care time) Results for orders placed or performed during the hospital encounter of 04/19/15  CBG monitoring, ED  Result Value Ref Range   Glucose-Capillary 143 (H) 65 - 99 mg/dL   No results found.    MDM   Final diagnoses:  Venous stasis  Swelling of lower extremity    Patient with bilateral lower extremity peripheral edema. Symptoms appear chronic. Consistent with chronic venous stasis. Last ejection fraction was 60-65%. Patient is taking 80 mg Lasix. Recommend that she continue  this. Recommend compression stockings to be worn during the day. Patient understands and agrees to plan. She denies any chest pain or shortness breath. She is stable and ready for discharge. Primary care follow-up recommended.    [name], PA-C 04/19/15 1601  Varney Biles, MD 04/27/15 0130

## 2015-04-19 NOTE — ED Notes (Signed)
Pt states she has bilateral lower leg swelling  Pt has swelling and redness noted in both of her lower extremities  Pt states she has knots that have come up in her legs and feet  Pt states her legs are painful  Pt states they have become worse over the weekend

## 2015-04-19 NOTE — Discharge Instructions (Signed)
Peripheral Edema °You have swelling in your legs (peripheral edema). This swelling is due to excess accumulation of salt and water in your body. Edema may be a sign of heart, kidney or liver disease, or a side effect of a medication. It may also be due to problems in the leg veins. Elevating your legs and using special support stockings may be very helpful, if the cause of the swelling is due to poor venous circulation. Avoid long periods of standing, whatever the cause. °Treatment of edema depends on identifying the cause. Chips, pretzels, pickles and other salty foods should be avoided. Restricting salt in your diet is almost always needed. Water pills (diuretics) are often used to remove the excess salt and water from your body via urine. These medicines prevent the kidney from reabsorbing sodium. This increases urine flow. °Diuretic treatment may also result in lowering of potassium levels in your body. Potassium supplements may be needed if you have to use diuretics daily. Daily weights can help you keep track of your progress in clearing your edema. You should call your caregiver for follow up care as recommended. °SEEK IMMEDIATE MEDICAL CARE IF:  °· You have increased swelling, pain, redness, or heat in your legs. °· You develop shortness of breath, especially when lying down. °· You develop chest or abdominal pain, weakness, or fainting. °· You have a fever. °Document Released: 08/18/2004 Document Revised: 10/03/2011 Document Reviewed: 07/29/2009 °ExitCare® Patient Information ©2015 [employer]. This information is not intended to replace advice given to you by your health care provider. Make sure you discuss any questions you have with your health care provider. ° °

## 2015-06-15 ENCOUNTER — Ambulatory Visit: Payer: Self-pay | Admitting: Adult Health

## 2015-06-16 ENCOUNTER — Ambulatory Visit (INDEPENDENT_AMBULATORY_CARE_PROVIDER_SITE_OTHER): Payer: Self-pay | Admitting: Adult Health

## 2015-06-16 ENCOUNTER — Encounter: Payer: Self-pay | Admitting: Adult Health

## 2015-06-16 VITALS — BP 138/84 | HR 93 | Temp 98.3°F | Ht 61.0 in | Wt 230.0 lb

## 2015-06-16 DIAGNOSIS — J309 Allergic rhinitis, unspecified: Secondary | ICD-10-CM

## 2015-06-16 DIAGNOSIS — J4541 Moderate persistent asthma with (acute) exacerbation: Secondary | ICD-10-CM

## 2015-06-16 MED ORDER — LEVALBUTEROL HCL 0.63 MG/3ML IN NEBU
0.6300 mg | INHALATION_SOLUTION | Freq: Once | RESPIRATORY_TRACT | Status: AC
  Administered 2015-06-16: 0.63 mg via RESPIRATORY_TRACT

## 2015-06-16 MED ORDER — PREDNISONE 10 MG PO TABS: ORAL_TABLET | ORAL | Status: DC

## 2015-06-16 MED ORDER — AZITHROMYCIN 250 MG PO TABS: ORAL_TABLET | ORAL | Status: AC

## 2015-06-16 NOTE — Assessment & Plan Note (Signed)
Change to Claritin daily As needed

## 2015-06-16 NOTE — Patient Instructions (Signed)
Zpack take as directed. Prednisone taper over next week.  Mucinex DM Twice daily  As needed  Cough/congestion  Saline nasal rinses As needed   Claritin 10mg  At bedtime  As needed  Drainage  Follow up Dr. Elsworth Soho  In  4 months  and As needed   Please contact office for sooner follow up if symptoms do not improve or worsen or seek emergency care

## 2015-06-16 NOTE — Addendum Note (Signed)
Addended by: Osa Craver on: 06/16/2015 02:57 PM   Modules accepted: Orders

## 2015-06-16 NOTE — Progress Notes (Signed)
Subjective:    Patient ID: Mariah [name], female    DOB: 02/06/1951, 64 y.o.   MRN: [id number]  HPI 64 year old AAF Never smoker with persistent Asthma well controlled on advair -hx poor compliance due to financial reasons.  06/16/2015 Follow up : Asthma  Returns for 4 month follow up . Complains of over last month has had a sinus infection . She says she had to  Take a Zpack . Feels it did not work. Now cough and congestion.  She still has difficulty affording her medications even though she has Medicaid. She has no significant financial source of income, Patient remains on Advair twice daily. Has not missed any doses.  She denies any chest pain, orthopnea, PND, hemoptysis. Chest x-ray earlier this year in [facility] with no acute process. Says she continues to have significant joint pain especially in her knees , hands and feet.  Currently working on disability . Filing for hardship case. Feels very frustrated with this process.    Past Medical History  Diagnosis Date  . Thumb pain     right  . Arthritis     right anke/foot  . Allergic rhinitis   . Asthma   . HTN (hypertension)   . GERD (gastroesophageal reflux disease)   . Tendinitis   . Knee pain   . Bursitis   . Carpal tunnel syndrome   . Diabetes mellitus without complication ([place])   . Obesity   . Sciatica    Current Outpatient Prescriptions on File Prior to Visit  Medication Sig Dispense Refill  . albuterol (PROVENTIL HFA;VENTOLIN HFA) 108 (90 BASE) MCG/ACT inhaler Inhale 1-2 puffs into the lungs every 6 (six) hours as needed for wheezing. 8 g 5  . celecoxib (CELEBREX) 50 MG capsule Take 1 capsule (50 mg total) by mouth 2 (two) times daily. 30 capsule 0  . cetirizine (ZYRTEC) 10 MG tablet Take 10 mg by mouth daily.    . cyclobenzaprine (FLEXERIL) 10 MG tablet Take 10 mg by mouth 3 (three) times daily as needed for muscle spasms.    . Fluticasone-Salmeterol (ADVAIR DISKUS) 100-50 MCG/DOSE AEPB Inhale 1 puff into the  lungs 2 (two) times daily. 1 each 0  . furosemide (LASIX) 40 MG tablet Take 40 mg by mouth daily.     [name] glipiZIDE (GLUCOTROL XL) 10 MG 24 hr tablet Take 10 mg by mouth daily after supper.     . Menthol (BIOFREEZE) 10 % AERO Apply 1 application topically 3 (three) times daily as needed (pain).    . metFORMIN (GLUCOPHAGE-XR) 500 MG 24 hr tablet Take 1,000 mg by mouth 2 (two) times daily.    [name] amLODipine (NORVASC) 10 MG tablet Take 10 mg by mouth daily.    . cloNIDine (CATAPRES) 0.2 MG tablet Take 1 tablet (0.2 mg total) by mouth 3 (three) times daily. (Patient not taking: Reported on 06/16/2015) 90 tablet 3  . cyclobenzaprine (FLEXERIL) 5 MG tablet Take 1 tablet (5 mg total) by mouth 3 (three) times daily as needed for muscle spasms. (Patient not taking: Reported on 02/13/2015) 10 tablet 0  . esomeprazole (NEXIUM) 20 MG capsule Take 20 mg by mouth every morning.    . mometasone (NASONEX) 50 MCG/ACT nasal spray Place 2 sprays into the nose daily. (Patient not taking: Reported on 04/19/2015) 7.5 g 0  . naproxen sodium (ALEVE) 220 MG tablet Take 1 tablet (220 mg total) by mouth 2 (two) times daily with a meal. (Patient not taking: Reported  on 06/16/2015) 14 tablet 0  . traMADol (ULTRAM) 50 MG tablet Take 50 mg by mouth every 6 (six) hours as needed.     No current facility-administered medications on file prior to visit.    Review of Systems  Constitutional:   No  weight loss, night sweats,  Fevers, chills,  +fatigue, or  lassitude.  HEENT:   No headaches,  Difficulty swallowing,  Tooth/dental problems, or  Sore throat,                No sneezing, itching, ear ache,  +nasal congestion, post nasal drip,   CV:  No chest pain,  Orthopnea, PND, swelling in lower extremities, anasarca, dizziness, palpitations, syncope.   GI  No heartburn, indigestion, abdominal pain, nausea, vomiting, diarrhea, change in bowel habits, loss of appetite, bloody stools.   Resp:   No chest wall deformity  Skin: no rash  or lesions.  GU: no dysuria, change in color of urine, no urgency or frequency.  No flank pain, no hematuria   MS:  No joint pain or swelling.  No decreased range of motion.  No back pain.  Psych:  No change in mood or affect. No depression or anxiety.  No memory loss.          Objective:   Physical Exam GEN: A/Ox3; pleasant , NAD,  Morbidly obese   HEENT:  [place]/AT,  EACs-clear, TMs-wnl, NOSE-clear, THROAT-clear, no lesions, no postnasal drip or exudate noted.   NECK:  Supple w/ fair ROM; no JVD; normal carotid impulses w/o bruits; no thyromegaly or nodules palpated; no lymphadenopathy.  RESP  Few tr wheezes no accessory muscle use, no dullness to percussion  CARD:  RRR, no m/r/g  , 1+  peripheral edema, VI changes , pulses intact, no cyanosis or clubbing.  GI:   Soft & nt; nml bowel sounds; no organomegaly or masses detected.  Musco: Warm bil, no deformities or joint swelling noted.   Neuro: alert, no focal deficits noted.    Skin: Warm, no lesions or rashes         Assessment & Plan:

## 2015-06-16 NOTE — Assessment & Plan Note (Addendum)
Flare with slow to resolve bronchitis  xopenex neb x 1   Plan  Zpack take as directed. Prednisone taper over next week.  Mucinex DM Twice daily  As needed  Cough/congestion  Saline nasal rinses As needed   Claritin 10mg  At bedtime  As needed  Drainage  Follow up Dr. Elsworth Soho  In  4 months  and As needed   Please contact office for sooner follow up if symptoms do not improve or worsen or seek emergency care

## 2015-06-17 NOTE — Progress Notes (Signed)
Reviewed & agree with plan  

## 2015-09-17 ENCOUNTER — Encounter (HOSPITAL_COMMUNITY): Payer: Self-pay | Admitting: Emergency Medicine

## 2015-09-17 ENCOUNTER — Emergency Department (HOSPITAL_COMMUNITY)
Admission: EM | Admit: 2015-09-17 | Discharge: 2015-09-18 | Disposition: A | Discharge status: Home / Self Care | Payer: Medicaid Other | Attending: Emergency Medicine | Admitting: Emergency Medicine

## 2015-09-17 DIAGNOSIS — I739 Peripheral vascular disease, unspecified: Secondary | ICD-10-CM | POA: Diagnosis not present

## 2015-09-17 DIAGNOSIS — J45909 Unspecified asthma, uncomplicated: Secondary | ICD-10-CM | POA: Insufficient documentation

## 2015-09-17 DIAGNOSIS — Z79899 Other long term (current) drug therapy: Secondary | ICD-10-CM | POA: Diagnosis not present

## 2015-09-17 DIAGNOSIS — K219 Gastro-esophageal reflux disease without esophagitis: Secondary | ICD-10-CM | POA: Diagnosis not present

## 2015-09-17 DIAGNOSIS — Z7984 Long term (current) use of oral hypoglycemic drugs: Secondary | ICD-10-CM | POA: Diagnosis not present

## 2015-09-17 DIAGNOSIS — E119 Type 2 diabetes mellitus without complications: Secondary | ICD-10-CM | POA: Insufficient documentation

## 2015-09-17 DIAGNOSIS — E669 Obesity, unspecified: Secondary | ICD-10-CM | POA: Insufficient documentation

## 2015-09-17 DIAGNOSIS — K0889 Other specified disorders of teeth and supporting structures: Secondary | ICD-10-CM | POA: Insufficient documentation

## 2015-09-17 DIAGNOSIS — R0981 Nasal congestion: Secondary | ICD-10-CM

## 2015-09-17 DIAGNOSIS — M19071 Primary osteoarthritis, right ankle and foot: Secondary | ICD-10-CM | POA: Insufficient documentation

## 2015-09-17 DIAGNOSIS — J3489 Other specified disorders of nose and nasal sinuses: Secondary | ICD-10-CM | POA: Diagnosis not present

## 2015-09-17 DIAGNOSIS — Z880 Allergy status to penicillin: Secondary | ICD-10-CM | POA: Insufficient documentation

## 2015-09-17 DIAGNOSIS — G8929 Other chronic pain: Secondary | ICD-10-CM | POA: Diagnosis not present

## 2015-09-17 DIAGNOSIS — I10 Essential (primary) hypertension: Secondary | ICD-10-CM | POA: Diagnosis not present

## 2015-09-17 NOTE — ED Notes (Signed)
Pt is c/o bilateral leg pain  Pt states she has circulation problems, neuropathy, and arthritis in her legs  Pt states she has also had arthroscopic knee surgery   Pt states she has decreased mobility lately and increased pain  Pt is also c/o toothache and sinus problems

## 2015-09-17 NOTE — ED Provider Notes (Signed)
History  By signing my name below, I, Mariah [name], attest that this documentation has been prepared under the direction and in the presence of Mariah [name], [place]. Electronically Signed: Marlowe [name], ED Scribe. 09/17/2015. 11:58 PM.  Chief Complaint  Patient presents with  . Leg Pain  . Dental Pain   The history is provided by the patient and medical records. No language interpreter was used.    HPI Comments:  Mariah [name] is a 65 y.o. obese female who presents to the Emergency Department complaining of bilateral lower extremity pain that worsened four days ago. Pt states she is having difficulty moving her legs because of pain and reports swelling of BLE which is not new. She has not taken anything for her pain. Sitting increases her pain. She denies alleviating factors. She denies numbness, tingling or weakness of the lower extremities.  She also reports dental pain secondary to chipping a front tooth and fracturing a different tooth. She has not taken anything for pain. She denies modifying factors. She denies fever or chills. She does not have a Pharmacist, community.  Past Medical History  Diagnosis Date  . Thumb pain     right  . Arthritis     right anke/foot  . Allergic rhinitis   . Asthma   . HTN (hypertension)   . GERD (gastroesophageal reflux disease)   . Tendinitis   . Knee pain   . Bursitis   . Carpal tunnel syndrome   . Diabetes mellitus without complication ([place])   . Obesity   . Sciatica    Past Surgical History  Procedure Laterality Date  . Uterine fibroid surgery  1988    Dr [name]  . Repeat fibroid excision  1997    Dr [name]  . Arthroscopic left knee surgery  1992  . Right heel surgery on plantar facitis  1995  . Wrist surgery     Family History  Problem Relation Age of Onset  . Uterine cancer Mother   . Hypertension Mother   . Hypertension Father   . Alzheimer's disease Father   . Cancer Other    Social History  Substance Use Topics  . Smoking  status: Never Smoker   . Smokeless tobacco: Never Used  . Alcohol Use: No   OB History    No data available     Review of Systems  Constitutional: Negative for fever and chills.  HENT: Positive for dental problem.   Cardiovascular: Positive for leg swelling.  Musculoskeletal: Positive for myalgias.  Neurological: Negative for weakness and numbness.  All other systems reviewed and are negative.   Allergies  Codeine; Doxycycline; Indomethacin; and Penicillins  Home Medications   Prior to Admission medications   Medication Sig Start Date End Date Taking? Authorizing Provider  albuterol (PROVENTIL HFA;VENTOLIN HFA) 108 (90 BASE) MCG/ACT inhaler Inhale 1-2 puffs into the lungs every 6 (six) hours as needed for wheezing. 06/16/14  Yes Tammy S Parrett, NP  amLODipine (NORVASC) 10 MG tablet Take 10 mg by mouth daily.   Yes Historical Provider, MD  celecoxib (CELEBREX) 50 MG capsule Take 1 capsule (50 mg total) by mouth 2 (two) times daily. 12/19/14  Yes Tiffany Carlota Raspberry, PA-C  cetirizine (ZYRTEC) 10 MG tablet Take 10 mg by mouth daily.   Yes Historical Provider, MD  cloNIDine (CATAPRES) 0.2 MG tablet Take 1 tablet (0.2 mg total) by mouth 3 (three) times daily. 10/23/12  Yes Ripudeep Krystal Eaton, MD  cyclobenzaprine (FLEXERIL) 10 MG tablet Take 10 mg  by mouth 3 (three) times daily as needed for muscle spasms.   Yes Historical Provider, MD  esomeprazole (NEXIUM) 20 MG capsule Take 20 mg by mouth every morning.   Yes Historical Provider, MD  Fluticasone-Salmeterol (ADVAIR DISKUS) 100-50 MCG/DOSE AEPB Inhale 1 puff into the lungs 2 (two) times daily. 10/14/14  Yes Rigoberto Noel, MD  furosemide (LASIX) 40 MG tablet Take 40 mg by mouth daily.  04/22/13  Yes Tammy S Parrett, NP  glipiZIDE (GLUCOTROL) 10 MG tablet Take 10 mg by mouth 2 (two) times daily before a meal.   Yes Historical Provider, MD  Menthol (BIOFREEZE) 10 % AERO Apply 1 application topically 3 (three) times daily as needed (pain).   Yes  Historical Provider, MD  metFORMIN (GLUCOPHAGE-XR) 500 MG 24 hr tablet Take 1,000 mg by mouth 2 (two) times daily.   Yes Historical Provider, MD  traMADol (ULTRAM) 50 MG tablet Take 50 mg by mouth every 6 (six) hours as needed for moderate pain.    Yes Historical Provider, MD  cyclobenzaprine (FLEXERIL) 5 MG tablet Take 1 tablet (5 mg total) by mouth 3 (three) times daily as needed for muscle spasms. Patient not taking: Reported on 09/17/2015 07/06/14   Serita Grit, MD  mometasone (NASONEX) 50 MCG/ACT nasal spray Place 2 sprays into the nose daily. Patient not taking: Reported on 04/19/2015 10/14/14   Rigoberto Noel, MD  naproxen sodium (ALEVE) 220 MG tablet Take 1 tablet (220 mg total) by mouth 2 (two) times daily with a meal. Patient not taking: Reported on 06/16/2015 07/06/14   Serita Grit, MD  predniSONE (DELTASONE) 10 MG tablet 4 tabs for 2 days, then 3 tabs for 2 days, 2 tabs for 2 days, then 1 tab for 2 days, then stop Patient not taking: Reported on 09/17/2015 06/16/15   Melvenia Needles, NP   Triage Vitals: BP 146/75 mmHg  Pulse 100  Temp(Src) 98 F (36.7 C) (Oral)  Resp 20  SpO2 98%  LMP 04/08/2013 Physical Exam  Constitutional: She is oriented to person, place, and time. She appears well-developed and well-nourished.  HENT:  Head: Normocephalic and atraumatic.  Eyes: Conjunctivae and EOM are normal. Pupils are equal, round, and reactive to light.  Neck: Normal range of motion. Neck supple.  Cardiovascular: Normal rate, regular rhythm and intact distal pulses.  Exam reveals no gallop and no friction rub.   No murmur heard. Intact dorsalis pedis and posterior tibialis pulses bilaterally  Pulmonary/Chest: Effort normal and breath sounds normal. No respiratory distress. She has no wheezes. She has no rales. She exhibits no tenderness.  Abdominal: Soft. Bowel sounds are normal. She exhibits no distension and no mass. There is no tenderness. There is no rebound and no guarding.   Musculoskeletal: Normal range of motion. She exhibits no edema or tenderness.  Mild bilateral lower extremity swelling, no pitting edema, consistent with peripheral vascular disease  Neurological: She is alert and oriented to person, place, and time.  No sensation deficits  Skin: Skin is warm and dry.  No evidence of cellulitis, abscess, or infection  Psychiatric: She has a normal mood and affect. Her behavior is normal. Judgment and thought content normal.  Nursing note and vitals reviewed.   ED Course  Procedures (including critical care time) DIAGNOSTIC STUDIES: Oxygen Saturation is 98% on RA, normal by my interpretation.   COORDINATION OF CARE: 11:57 PM- Advised pt to wear compression stockings and give referral to vascular specialist. Will prescribe pain medication and antibiotic. Pt  verbalizes understanding and agrees to plan.    MDM   Final diagnoses:  Peripheral vascular disease ([place])  Pain, dental  Sinus congestion    Patient with chronic bilateral leg pain. She has peripheral vascular disease. She does have strong pulses distally. There is no evidence of cellulitis. No trauma. No new symptoms. I will give her something for pain, but recommend that she follow-up with vascular. She also complains of some dental pain and sinus congestion. Will treat this as well.  I personally performed the services described in this documentation, which was scribed in my presence. The recorded information has been reviewed and is accurate.       [name], PA-C 09/18/15 0012  Virgel Manifold, MD 09/18/15 [phone]

## 2015-09-18 MED ORDER — OXYCODONE-ACETAMINOPHEN 5-325 MG PO TABS: 2.0000 | ORAL_TABLET | Freq: Four times a day (QID) | ORAL | Status: DC | PRN

## 2015-09-18 MED ORDER — CLINDAMYCIN HCL 150 MG PO CAPS: 300.0000 mg | ORAL_CAPSULE | Freq: Three times a day (TID) | ORAL | Status: DC

## 2015-09-18 MED ORDER — HYDROCODONE-ACETAMINOPHEN 5-325 MG PO TABS: 1.0000 | ORAL_TABLET | Freq: Four times a day (QID) | ORAL | Status: DC | PRN

## 2015-09-18 NOTE — ED Notes (Addendum)
Pt upset at discharge. Stating that we should get her a voucher to get home because she didn't expect to be here this long. Pt was informed that we are not responsible for transportation. Pt states that she planned to have someone pick her up, but its too late for them now. Pt will wait in lobby for transportation. Charge Nurse made aware

## 2015-09-18 NOTE — Discharge Instructions (Signed)
Dental Pain Dental pain may be caused by many things, including:  Tooth decay (cavities or caries). Cavities expose the nerve of your tooth to air and hot or cold temperatures. This can cause pain or discomfort.  Abscess or infection. A dental abscess is a collection of infected pus from a bacterial infection in the inner part of the tooth (pulp). It usually occurs at the end of the tooth's root.  Injury.  An unknown reason (idiopathic). Your pain may be mild or severe. It may only occur when:  You are chewing.  You are exposed to hot or cold temperature.  You are eating or drinking sugary foods or beverages, such as soda or candy. Your pain may also be constant. HOME CARE INSTRUCTIONS Watch your dental pain for any changes. The following actions may help to lessen any discomfort that you are feeling:  Take medicines only as directed by your dentist.  If you were prescribed an antibiotic medicine, finish all of it even if you start to feel better.  Keep all follow-up visits as directed by your dentist. This is important.  Do not apply heat to the outside of your face.  Rinse your mouth or gargle with salt water if directed by your dentist. This helps with pain and swelling.  You can make salt water by adding  tsp of salt to 1 cup of warm water.  Apply ice to the painful area of your face:  Put ice in a plastic bag.  Place a towel between your skin and the bag.  Leave the ice on for 20 minutes, 2-3 times per day.  Avoid foods or drinks that cause you pain, such as:  Very hot or very cold foods or drinks.  Sweet or sugary foods or drinks. SEEK MEDICAL CARE IF:  Your pain is not controlled with medicines.  Your symptoms are worse.  You have new symptoms. SEEK IMMEDIATE MEDICAL CARE IF:  You are unable to open your mouth.  You are having trouble breathing or swallowing.  You have a fever.  Your face, neck, or jaw is swollen.   This information is not  intended to replace advice given to you by your health care provider. Make sure you discuss any questions you have with your health care provider.   Document Released: 07/11/2005 Document Revised: 11/25/2014 Document Reviewed: 07/07/2014 Elsevier Interactive Patient Education 2016 [employer] Peripheral Vascular Disease Peripheral vascular disease (PVD) is a disease of the blood vessels that are not part of your heart and brain. A simple term for PVD is poor circulation. In most cases, PVD narrows the blood vessels that carry blood from your heart to the rest of your body. This can result in a decreased supply of blood to your arms, legs, and internal organs, like your stomach or kidneys. However, it most often affects a person's lower legs and feet. There are two types of PVD.  Organic PVD. This is the more common type. It is caused by damage to the structure of blood vessels.  Functional PVD. This is caused by conditions that make blood vessels contract and tighten (spasm). Without treatment, PVD tends to get worse over time. PVD can also lead to acute ischemic limb. This is when an arm or limb suddenly has trouble getting enough blood. This is a medical emergency. CAUSES Each type of PVD has many different causes. The most common cause of PVD is buildup of a fatty material (plaque) inside of your arteries (atherosclerosis). Small amounts of  plaque can break off from the walls of the blood vessels and become lodged in a smaller artery. This blocks blood flow and can cause acute ischemic limb. Other common causes of PVD include:  Blood clots that form inside of blood vessels.  Injuries to blood vessels.  Diseases that cause inflammation of blood vessels or cause blood vessel spasms.  Health behaviors and health history that increase your risk of developing PVD. RISK FACTORS  You may have a greater risk of PVD if you:  Have a family history of PVD.  Have certain medical conditions,  including:  High cholesterol.  Diabetes.  High blood pressure (hypertension).  Coronary heart disease.  Past problems with blood clots.  Past injury, such as burns or a broken bone. These may have damaged blood vessels in your limbs.  Buerger disease. This is caused by inflamed blood vessels in your hands and feet.  Some forms of arthritis.  Rare birth defects that affect the arteries in your legs.  Use tobacco.  Do not get enough exercise.  Are obese.  Are age 38 or older. SIGNS AND SYMPTOMS  PVD may cause many different symptoms. Your symptoms depend on what part of your body is not getting enough blood. Some common signs and symptoms include:  Cramps in your lower legs. This may be a symptom of poor leg circulation (claudication).  Pain and weakness in your legs while you are physically active that goes away when you rest (intermittent claudication).  Leg pain when at rest.  Leg numbness, tingling, or weakness.  Coldness in a leg or foot, especially when compared with the other leg.  Skin or hair changes. These can include:  Hair loss.  Shiny skin.  Pale or bluish skin.  Thick toenails.  Inability to get or maintain an erection (erectile dysfunction). People with PVD are more prone to developing ulcers and sores on their toes, feet, or legs. These may take longer than normal to heal. DIAGNOSIS Your health care provider may diagnose PVD from your signs and symptoms. The health care provider will also do a physical exam. You may have tests to find out what is causing your PVD and determine its severity. Tests may include:  Blood pressure recordings from your arms and legs and measurements of the strength of your pulses (pulse volume recordings).  Imaging studies using sound waves to take pictures of the blood flow through your blood vessels (Doppler ultrasound).  Injecting a dye into your blood vessels before having imaging studies using:  X-rays  (angiogram or arteriogram).  Computer-generated X-rays (CT angiogram).  A powerful electromagnetic field and a computer (magnetic resonance angiogram or MRA). TREATMENT Treatment for PVD depends on the cause of your condition and the severity of your symptoms. It also depends on your age. Underlying causes need to be treated and controlled. These include long-lasting (chronic) conditions, such as diabetes, high cholesterol, and high blood pressure. You may need to first try making lifestyle changes and taking medicines. Surgery may be needed if these do not work. Lifestyle changes may include:  Quitting smoking.  Exercising regularly.  Following a low-fat, low-cholesterol diet. Medicines may include:  Blood thinners to prevent blood clots.  Medicines to improve blood flow.  Medicines to improve your blood cholesterol levels. Surgical procedures may include:  A procedure that uses an inflated balloon to open a blocked artery and improve blood flow (angioplasty).  A procedure to put in a tube (stent) to keep a blocked artery open (stent  implant).  Surgery to reroute blood flow around a blocked artery (peripheral bypass surgery).  Surgery to remove dead tissue from an infected wound on the affected limb.  Amputation. This is surgical removal of the affected limb. This may be necessary in cases of acute ischemic limb that are not improved through medical or surgical treatments. HOME CARE INSTRUCTIONS  Take medicines only as directed by your health care provider.  Do not use any tobacco products, including cigarettes, chewing tobacco, or electronic cigarettes. If you need help quitting, ask your health care provider.  Lose weight if you are overweight, and maintain a healthy weight as directed by your health care provider.  Eat a diet that is low in fat and cholesterol. If you need help, ask your health care provider.  Exercise regularly. Ask your health care provider to suggest  some good activities for you.  Use compression stockings or other mechanical devices as directed by your health care provider.  Take good care of your feet.  Wear comfortable shoes that fit well.  Check your feet often for any cuts or sores. SEEK MEDICAL CARE IF:  You have cramps in your legs while walking.  You have leg pain when you are at rest.  You have coldness in a leg or foot.  Your skin changes.  You have erectile dysfunction.  You have cuts or sores on your feet that are not healing. SEEK IMMEDIATE MEDICAL CARE IF:  Your arm or leg turns cold and blue.  Your arms or legs become red, warm, swollen, painful, or numb.  You have chest pain or trouble breathing.  You suddenly have weakness in your face, arm, or leg.  You become very confused or lose the ability to speak.  You suddenly have a very bad headache or lose your vision.   This information is not intended to replace advice given to you by your health care provider. Make sure you discuss any questions you have with your health care provider.   Document Released: 08/18/2004 Document Revised: 08/01/2014 Document Reviewed: 12/19/2013 Elsevier Interactive Patient Education [employer].

## 2015-09-29 ENCOUNTER — Telehealth: Payer: Self-pay | Admitting: Vascular Surgery

## 2015-09-29 NOTE — Telephone Encounter (Signed)
She had only minimal swelling at this 09-17-15 ED appt per [name], PAC's notes. This is a chronic problem per EPIC notes. I would do ABI's for BLE pain to start with and we'll go from there. No studies were done at that ED visit.

## 2015-09-29 NOTE — Telephone Encounter (Signed)
Mariah [name] called to state that she was in the Emergency Department and was told to see Dr Scot Dock ASAP. While in the hsp, she complained of bilateral LE swelling, and pain with walking and sitting. Her discharge diagnosis is PVD.   Should I schedule her for ABI only to start with, or include a venous due to the amount of swelling described?  Please advise.   Thanks, Hinton Dyer

## 2015-10-09 ENCOUNTER — Telehealth: Payer: Self-pay | Admitting: *Deleted

## 2015-10-14 ENCOUNTER — Ambulatory Visit (INDEPENDENT_AMBULATORY_CARE_PROVIDER_SITE_OTHER): Payer: Medicaid Other | Admitting: Pulmonary Disease

## 2015-10-14 ENCOUNTER — Encounter: Payer: Self-pay | Admitting: Pulmonary Disease

## 2015-10-14 VITALS — BP 148/78 | HR 88 | Ht 61.0 in | Wt 228.0 lb

## 2015-10-14 DIAGNOSIS — J209 Acute bronchitis, unspecified: Secondary | ICD-10-CM | POA: Diagnosis not present

## 2015-10-14 DIAGNOSIS — J4530 Mild persistent asthma, uncomplicated: Secondary | ICD-10-CM | POA: Diagnosis not present

## 2015-10-14 MED ORDER — AZITHROMYCIN 250 MG PO TABS: ORAL_TABLET | ORAL | Status: DC

## 2015-10-14 NOTE — Assessment & Plan Note (Signed)
Stay on advair Use albuterol as needed for wheezing

## 2015-10-14 NOTE — Patient Instructions (Signed)
Zpak Stay on advair Use albuterol as needed for wheezing

## 2015-10-14 NOTE — Assessment & Plan Note (Signed)
Z pak

## 2015-10-14 NOTE — Progress Notes (Signed)
   Subjective:    Patient ID: Mariah [name], female    DOB: 1951-01-09, 65 y.o.   MRN: [id number]  HPI   65 year old AAF Never smoker with persistent Asthma well controlled on advair -hx poor compliance due to financial reasons.   10/14/2015  Chief Complaint  Patient presents with  . Follow-up    Breathing not doing well.  In a lot of pain in legs, knees, feet.  Legs swollen.      4 month follow up .  Sinuses acting up -reports yellow drainage  Legs swelling 2 ED visits last 83m    She still has difficulty affording her medications even though she has Medicaid. She has no source of income, Reports compliance with Advair  She is still waiting on her disability application And had issues with homelessness   Review of Systems Patient denies significant dyspnea,cough, hemoptysis,  chest pain, palpitations, pedal edema, orthopnea, paroxysmal nocturnal dyspnea, lightheadedness, nausea, vomiting, abdominal or  leg pains      Objective:   Physical Exam  Gen. Pleasant, obese, in no distress ENT - no lesions, no post nasal drip Neck: No JVD, no thyromegaly, no carotid bruits Lungs: no use of accessory muscles, no dullness to percussion, decreased without rales or rhonchi  Cardiovascular: Rhythm regular, heart sounds  normal, no murmurs or gallops, no peripheral edema Musculoskeletal: No deformities, no cyanosis or clubbing , no tremors       Assessment & Plan:

## 2015-11-10 ENCOUNTER — Other Ambulatory Visit: Payer: Self-pay | Admitting: *Deleted

## 2015-11-10 DIAGNOSIS — I739 Peripheral vascular disease, unspecified: Secondary | ICD-10-CM

## 2015-11-11 ENCOUNTER — Encounter: Payer: Self-pay | Admitting: Vascular Surgery

## 2015-11-18 ENCOUNTER — Encounter: Payer: Self-pay | Admitting: Vascular Surgery

## 2015-11-18 ENCOUNTER — Ambulatory Visit (INDEPENDENT_AMBULATORY_CARE_PROVIDER_SITE_OTHER): Payer: Medicaid Other | Admitting: Vascular Surgery

## 2015-11-18 ENCOUNTER — Ambulatory Visit (HOSPITAL_COMMUNITY)
Admission: RE | Admit: 2015-11-18 | Discharge: 2015-11-18 | Disposition: A | Discharge status: Home / Self Care | Payer: Medicaid Other | Source: Ambulatory Visit | Attending: Vascular Surgery | Admitting: Vascular Surgery

## 2015-11-18 VITALS — BP 133/79 | HR 72 | Temp 97.2°F | Resp 18 | Ht 61.0 in | Wt 222.5 lb

## 2015-11-18 DIAGNOSIS — K219 Gastro-esophageal reflux disease without esophagitis: Secondary | ICD-10-CM | POA: Diagnosis not present

## 2015-11-18 DIAGNOSIS — E119 Type 2 diabetes mellitus without complications: Secondary | ICD-10-CM | POA: Insufficient documentation

## 2015-11-18 DIAGNOSIS — R0989 Other specified symptoms and signs involving the circulatory and respiratory systems: Secondary | ICD-10-CM | POA: Diagnosis present

## 2015-11-18 DIAGNOSIS — I10 Essential (primary) hypertension: Secondary | ICD-10-CM | POA: Diagnosis not present

## 2015-11-18 DIAGNOSIS — I739 Peripheral vascular disease, unspecified: Secondary | ICD-10-CM | POA: Insufficient documentation

## 2015-11-18 DIAGNOSIS — I70219 Atherosclerosis of native arteries of extremities with intermittent claudication, unspecified extremity: Secondary | ICD-10-CM

## 2015-11-18 NOTE — Progress Notes (Signed)
[facility] [place]  Patient name: Mariah [name] MRN: [id number] DOB: [birth year] Sex: female  REASON FOR CONSULT: peripheral vascular disease. Referred by the emergency department.  HPI: Mariah [name] is a 65 y.o. female, who is referred by the emergency department with leg pain and swelling. He was referred to our office for evaluation of peripheral vascular disease and leg swelling.  She does complain of bilateral knee pain and she is followed by Dr. Gladstone Lighter. I do not get any clear-cut history of claudication although I believe her activity is very limited because of her weight and knee problems. I do not get any history of rest pain or nonhealing ulcers.  She has had leg swelling from as far back as 2011. She denies any history of previous DVT or phlebitis. She has had abdominal surgery for fibroid tumors. She denies any history of other inguinal surgery or radiation therapy.  I have reviewed the record from when the patient was seen in the emergency department on 09/17/2015. The patient was complaining of bilateral lower extremity pain which had been going on for 4 days. She also reported bilateral lower extremity swelling. I do not see evidence that a duplex was done to rule out DVT.  Past Medical History  Diagnosis Date  . Thumb pain     right  . Arthritis     right anke/foot  . Allergic rhinitis   . Asthma   . HTN (hypertension)   . GERD (gastroesophageal reflux disease)   . Tendinitis   . Knee pain   . Bursitis   . Carpal tunnel syndrome   . Diabetes mellitus without complication ([place])   . Obesity   . Sciatica     Family History  Problem Relation Age of Onset  . Uterine cancer Mother   . Hypertension Mother   . Hypertension Father   . Alzheimer's disease Father   . Cancer Other     SOCIAL HISTORY: Social History   Social History  . Marital Status: Single    Spouse Name: N/A  . Number of Children: 0  . Years of Education: N/A    Occupational History  . 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     Social History Main Topics  . Smoking status: Never Smoker   . Smokeless tobacco: Never Used  . Alcohol Use: No  . Drug Use: No  . Sexual Activity: Yes    Birth Control/ Protection: None   Other Topics Concern  . 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 Codeine Nausea And Vomiting  . Doxycycline Itching and Nausea And Vomiting  . Indomethacin Diarrhea  . Penicillins Hives    Current Outpatient Prescriptions  Medication Sig Dispense Refill  . albuterol (PROVENTIL HFA;VENTOLIN HFA) 108 (90 BASE) MCG/ACT inhaler Inhale 1-2 puffs into the lungs every 6 (six) hours as needed for wheezing. 8 g 5  . amLODipine (NORVASC) 10 MG tablet Take 10 mg by mouth daily.    . celecoxib (CELEBREX) 50 MG capsule Take 1 capsule (50 mg total) by mouth 2 (two) times daily. 30 capsule 0  . cetirizine (ZYRTEC) 10 MG tablet Take 10 mg by mouth daily.    . cloNIDine (CATAPRES) 0.2 MG  tablet Take 1 tablet (0.2 mg total) by mouth 3 (three) times daily. 90 tablet 3  . cyclobenzaprine (FLEXERIL) 10 MG tablet Take 10 mg by mouth 3 (three) times daily as needed for muscle spasms.    [name] esomeprazole (NEXIUM) 20 MG capsule Take 20 mg by mouth every morning.    . Fluticasone-Salmeterol (ADVAIR DISKUS) 100-50 MCG/DOSE AEPB Inhale 1 puff into the lungs 2 (two) times daily. 1 each 0  . furosemide (LASIX) 40 MG tablet Take 40 mg by mouth daily.     [name] glipiZIDE (GLUCOTROL) 10 MG tablet Take 10 mg by mouth 2 (two) times daily before a meal.    . metFORMIN (GLUCOPHAGE-XR) 500 MG 24 hr tablet Take 1,000 mg by mouth 2 (two) times daily.    . mometasone (NASONEX) 50 MCG/ACT nasal spray Place 2 sprays into the nose daily. 7.5 g 0  . traMADol (ULTRAM) 50 MG tablet Take 50 mg  by mouth every 6 (six) hours as needed for moderate pain.     [name] azithromycin (ZITHROMAX) 250 MG tablet Take 2 tablets first day, then 1 daily until gone. (Patient not taking: Reported on 11/18/2015) 6 tablet 0  . clindamycin (CLEOCIN) 150 MG capsule Take 2 capsules (300 mg total) by mouth 3 (three) times daily. May dispense as 150mg  capsules (Patient not taking: Reported on 10/14/2015) 60 capsule 0  . Menthol (BIOFREEZE) 10 % AERO Apply 1 application topically 3 (three) times daily as needed (pain). Reported on 11/18/2015    . naproxen sodium (ALEVE) 220 MG tablet Take 1 tablet (220 mg total) by mouth 2 (two) times daily with a meal. (Patient not taking: Reported on 11/18/2015) 14 tablet 0  . oxyCODONE-acetaminophen (PERCOCET/ROXICET) 5-325 MG tablet Take 2 tablets by mouth every 6 (six) hours as needed for severe pain. (Patient not taking: Reported on 10/14/2015) 15 tablet 0   No current facility-administered medications for this visit.    REVIEW OF SYSTEMS:  [X]  denotes positive finding, [ ]  denotes negative finding Cardiac  Comments:  Chest pain or chest pressure:    Shortness of breath upon exertion:    Short of breath when lying flat:    Irregular heart rhythm:        Vascular    Pain in calf, thigh, or hip brought on by ambulation:    Pain in feet at night that wakes you up from your sleep:     Blood clot in your veins:    Leg swelling:  X       Pulmonary    Oxygen at home:    Productive cough:     Wheezing:  X       Neurologic    Sudden weakness in arms or legs:     Sudden numbness in arms or legs:     Sudden onset of difficulty speaking or slurred speech:    Temporary loss of vision in one eye:     Problems with dizziness:         Gastrointestinal    Blood in stool:     Vomited blood:         Genitourinary    Burning when urinating:     Blood in urine:        Psychiatric    Major depression:         Hematologic    Bleeding problems:    Problems with blood clotting  too easily:        Skin  Rashes or ulcers:        Constitutional    Fever or chills:      PHYSICAL EXAM: Filed Vitals:   11/18/15 1300  BP: 133/79  Pulse: 72  Temp: 97.2 F (36.2 C)  TempSrc: Oral  Resp: 18  Height: 5\' 1"  (1.549 m)  Weight: 222 lb 8 oz (100.925 kg)  SpO2: 98%    GENERAL: The patient is a well-nourished female, in no acute distress. The vital signs are documented above. CARDIAC: There is a regular rate and rhythm.  VASCULAR: I do not Detect carotid bruits. He has palpable femoral pulses. Because of her weight, it is difficult to palpate popliteal or pedal pulses. However both feet are warm and well perfused. She has severe bilateral lower extremity swelling which extends up to her groins. PULMONARY: There is good air exchange bilaterally without wheezing or rales. ABDOMEN: Her abdomen is very large and difficult to assess. MUSCULOSKELETAL: There are no major deformities or cyanosis. NEUROLOGIC: No focal weakness or paresthesias are detected. SKIN: She has hyperpigmentation bilaterally consistent with chronic venous insufficiency. She also has swelling on the dorsum of both feet consistent with lymphedema. PSYCHIATRIC: The patient has a normal affect.  DATA:   LOWER EXTREMITY ARTERIAL DOPPLER STUDY: I have independently interpreted her lower extremity arterial Doppler study.  On the right side, there is a biphasic posterior tibial signal and a triphasic dorsalis pedis signal. ABI is 100%. Toe pressure on the right is 108 mmHg.  On the left side, there is a biphasic posterior tibial signal and a triphasic dorsalis pedis signal. ABI is 100%. Toe pressure on the left is 101 mmHg.  MEDICAL ISSUES:  COMBINED CHRONIC VENOUS INSUFFICIENCY AND LYMPHEDEMA: Based on her exam I think she has evidence of both chronic venous insufficiency (given her significant hyperpigmentation bilaterally), and also lymphedema given the distribution of her swelling. I reassured her  that I do not think she has any evidence of arterial insufficiency. I've also explained the treatment for both chronic venous insufficiency and lymphedema are the same. We have discussed the importance of intermittent leg elevation in the proper positioning for this. In addition I have written her a prescription for knee-high compression stockings with a gradient of 15-20 mmHg. She states that she has had problems getting on compression stockings in the past and for this reason I thought we would try her with a mild compression to start. I have also encouraged her to elevate her legs at least 3 times a day for 15-20 minutes to try to get the swelling down before being fitted for compression stockings. We have also discussed the importance of avoiding prolonged sitting and standing and trying to stay as active as possible. She understands that weight loss would also be helpful. Finally, she is enrolled in water aerobics and I think this is also very helpful for people with chronic venous insufficiency and lymphedema.  I have explained to her that this is a chronic problem and that she needs to make these conservative measures part of her lifestyle otherwise this tends to be a progressive disease. B happy to see her back at any time if her swelling or symptoms worsen. Given her obesity and severe leg swelling I think she would be at significantly increased risk for any surgery on her knees if this were ever required in the future.   Deitra Mayo [facility] [place]: [phone]

## 2016-01-14 ENCOUNTER — Other Ambulatory Visit: Payer: Self-pay | Admitting: Primary Care

## 2016-01-14 DIAGNOSIS — Z1231 Encounter for screening mammogram for malignant neoplasm of breast: Secondary | ICD-10-CM

## 2016-02-02 ENCOUNTER — Ambulatory Visit: Payer: Medicaid Other

## 2016-02-05 ENCOUNTER — Encounter (HOSPITAL_COMMUNITY): Payer: Self-pay | Admitting: Oncology

## 2016-02-05 ENCOUNTER — Emergency Department (HOSPITAL_COMMUNITY)
Admission: EM | Admit: 2016-02-05 | Discharge: 2016-02-06 | Disposition: A | Discharge status: Home / Self Care | Payer: Medicaid Other | Attending: Emergency Medicine | Admitting: Emergency Medicine

## 2016-02-05 DIAGNOSIS — R062 Wheezing: Secondary | ICD-10-CM | POA: Diagnosis not present

## 2016-02-05 DIAGNOSIS — M1990 Unspecified osteoarthritis, unspecified site: Secondary | ICD-10-CM | POA: Insufficient documentation

## 2016-02-05 DIAGNOSIS — R51 Headache: Secondary | ICD-10-CM | POA: Diagnosis not present

## 2016-02-05 DIAGNOSIS — I10 Essential (primary) hypertension: Secondary | ICD-10-CM | POA: Insufficient documentation

## 2016-02-05 DIAGNOSIS — Z79899 Other long term (current) drug therapy: Secondary | ICD-10-CM | POA: Insufficient documentation

## 2016-02-05 DIAGNOSIS — E119 Type 2 diabetes mellitus without complications: Secondary | ICD-10-CM | POA: Insufficient documentation

## 2016-02-05 DIAGNOSIS — Z791 Long term (current) use of non-steroidal anti-inflammatories (NSAID): Secondary | ICD-10-CM | POA: Insufficient documentation

## 2016-02-05 DIAGNOSIS — R519 Headache, unspecified: Secondary | ICD-10-CM

## 2016-02-05 DIAGNOSIS — Z7984 Long term (current) use of oral hypoglycemic drugs: Secondary | ICD-10-CM | POA: Insufficient documentation

## 2016-02-05 DIAGNOSIS — R0789 Other chest pain: Secondary | ICD-10-CM | POA: Insufficient documentation

## 2016-02-05 DIAGNOSIS — B9789 Other viral agents as the cause of diseases classified elsewhere: Secondary | ICD-10-CM

## 2016-02-05 DIAGNOSIS — R05 Cough: Secondary | ICD-10-CM

## 2016-02-05 DIAGNOSIS — R079 Chest pain, unspecified: Secondary | ICD-10-CM

## 2016-02-05 DIAGNOSIS — J069 Acute upper respiratory infection, unspecified: Secondary | ICD-10-CM

## 2016-02-05 DIAGNOSIS — Z7951 Long term (current) use of inhaled steroids: Secondary | ICD-10-CM | POA: Diagnosis not present

## 2016-02-05 DIAGNOSIS — R059 Cough, unspecified: Secondary | ICD-10-CM

## 2016-02-05 NOTE — ED Notes (Signed)
Pt eating chips and having soda in lobby.

## 2016-02-05 NOTE — ED Notes (Signed)
Pt c/o right sided headache since yesterday at 2200 that has not subsided.  Pt has not tried anything for pain.  Reports hx of sinus HA as well and is unsure which type of headache this is.

## 2016-02-05 NOTE — ED Provider Notes (Signed)
CSN: [id number]     Arrival date & time 02/05/16  2053 History  By signing my name below, I, Gwenlyn Fudge, attest that this documentation has been prepared under the direction and in the presence of Abigail Butts, [place]. Electronically Signed: Gwenlyn Fudge, ED Scribe. 02/06/2016. 12:08 AM.   Chief Complaint  Patient presents with  . Headache    The history is provided by the patient and medical records. No language interpreter was used.    HPI Comments: Mariah [name] is a 65 y.o. female with PMHx of HTN, DM, Allergic Rhinitis, and Asthma who presents to the Emergency Department complaining of gradual onset, right sided, waxing and waning, headache onset 10 pm last night. She states it started off like a sinus headache. Pt reports she never went to sleep and has associated right sided chest pain/tightness that she describes as a feeling of lightning shooting across her chest onset 4 AM this morning.  Pt denies having similar chest tightness. Pt also reports chills, fatigue, nasal congestion and a cough producing clear and yellow phlegm. She also states she has noticed bilateral leg swelling, but notes that the swelling has been ongoing for 2 years. She reports her leg swelling waxes and wanes and that her PCP is working to control the swelling. She denies shortness of breath, urinary symptoms, fever, abdominal pain, nausea or vomiting.   Past Medical History  Diagnosis Date  . Thumb pain     right  . Arthritis     right anke/foot  . Allergic rhinitis   . Asthma   . HTN (hypertension)   . GERD (gastroesophageal reflux disease)   . Tendinitis   . Knee pain   . Bursitis   . Carpal tunnel syndrome   . Diabetes mellitus without complication ([place])   . Obesity   . Sciatica    Past Surgical History  Procedure Laterality Date  . Uterine fibroid surgery  1988    Dr [name]  . Repeat fibroid excision  1997    Dr [name]  . Arthroscopic left knee surgery  2003  . Right heel surgery on plantar  facitis  1995  . Wrist surgery     Family History  Problem Relation Age of Onset  . Uterine cancer Mother   . Hypertension Mother   . Hypertension Father   . Alzheimer's disease Father   . Cancer Other    Social History  Substance Use Topics  . Smoking status: Never Smoker   . Smokeless tobacco: Never Used  . Alcohol Use: No   OB History    No data available     Review of Systems  Constitutional: Positive for chills and fatigue. Negative for fever, diaphoresis, appetite change and unexpected weight change.  HENT: Positive for congestion, postnasal drip, rhinorrhea and sinus pressure. Negative for mouth sores.   Eyes: Negative for visual disturbance.  Respiratory: Positive for cough and chest tightness. Negative for shortness of breath and wheezing.   Cardiovascular: Positive for chest pain.  Gastrointestinal: Negative for nausea, vomiting, abdominal pain, diarrhea and constipation.  Endocrine: Negative for polydipsia, polyphagia and polyuria.  Genitourinary: Negative for dysuria, urgency, frequency, hematuria and difficulty urinating.  Musculoskeletal: Negative for back pain and neck stiffness.  Skin: Negative for rash.  Allergic/Immunologic: Negative for immunocompromised state.  Neurological: Positive for headaches. Negative for syncope, light-headedness and numbness.       Paresthesia (sensation of pins and needles on right arm)  Hematological: Does not bruise/bleed easily.  Psychiatric/Behavioral:  Negative for sleep disturbance. The patient is not nervous/anxious.   All other systems reviewed and are negative.   Allergies  Codeine; Doxycycline; Indomethacin; and Penicillins  Home Medications   Prior to Admission medications   Medication Sig Start Date End Date Taking? Authorizing Provider  albuterol (PROVENTIL HFA;VENTOLIN HFA) 108 (90 BASE) MCG/ACT inhaler Inhale 1-2 puffs into the lungs every 6 (six) hours as needed for wheezing. 06/16/14   Tammy S Parrett, NP   amLODipine (NORVASC) 10 MG tablet Take 10 mg by mouth daily.    Historical Provider, MD  celecoxib (CELEBREX) 50 MG capsule Take 1 capsule (50 mg total) by mouth 2 (two) times daily. 12/19/14   Tiffany Carlota Raspberry, PA-C  cetirizine (ZYRTEC) 10 MG tablet Take 10 mg by mouth daily.    Historical Provider, MD  cloNIDine (CATAPRES) 0.2 MG tablet Take 1 tablet (0.2 mg total) by mouth 3 (three) times daily. 10/23/12   Ripudeep Krystal Eaton, MD  cyclobenzaprine (FLEXERIL) 10 MG tablet Take 10 mg by mouth 3 (three) times daily as needed for muscle spasms.    Historical Provider, MD  esomeprazole (NEXIUM) 20 MG capsule Take 20 mg by mouth every morning.    Historical Provider, MD  Fluticasone-Salmeterol (ADVAIR DISKUS) 100-50 MCG/DOSE AEPB Inhale 1 puff into the lungs 2 (two) times daily. 10/14/14   Rigoberto Noel, MD  furosemide (LASIX) 40 MG tablet Take 40 mg by mouth daily.  04/22/13   Tammy S Parrett, NP  glipiZIDE (GLUCOTROL) 10 MG tablet Take 10 mg by mouth 2 (two) times daily before a meal.    Historical Provider, MD  Menthol (BIOFREEZE) 10 % AERO Apply 1 application topically 3 (three) times daily as needed (pain). Reported on 11/18/2015    Historical Provider, MD  metFORMIN (GLUCOPHAGE-XR) 500 MG 24 hr tablet Take 1,000 mg by mouth 2 (two) times daily.    Historical Provider, MD  mometasone (NASONEX) 50 MCG/ACT nasal spray Place 2 sprays into the nose daily. 10/14/14   Rigoberto Noel, MD  traMADol (ULTRAM) 50 MG tablet Take 50 mg by mouth every 6 (six) hours as needed for moderate pain.     Historical Provider, MD   BP 175/84 mmHg  Pulse 103  Temp(Src) 98.5 F (36.9 C) (Oral)  Resp 18  Ht 5\' 1"  (1.549 m)  Wt 221 lb (100.245 kg)  BMI 41.78 kg/m2  SpO2 100%  LMP 04/08/2013 Physical Exam  Constitutional: She is oriented to person, place, and time. She appears well-developed and well-nourished. No distress.  HENT:  Head: Normocephalic and atraumatic.  Right Ear: Tympanic membrane, external ear and ear canal  normal.  Left Ear: Tympanic membrane, external ear and ear canal normal.  Nose: Mucosal edema and rhinorrhea present. No epistaxis. Right sinus exhibits no maxillary sinus tenderness and no frontal sinus tenderness. Left sinus exhibits no maxillary sinus tenderness and no frontal sinus tenderness.  Mouth/Throat: Uvula is midline, oropharynx is clear and moist and mucous membranes are normal. Mucous membranes are not pale and not cyanotic. No oropharyngeal exudate, posterior oropharyngeal edema, posterior oropharyngeal erythema or tonsillar abscesses.  Lungs expiratory Ears - red canals erythematous, Nasal mucus Oropharynx clear  Eyes: Conjunctivae and EOM are normal. Pupils are equal, round, and reactive to light. No scleral icterus.  No horizontal, vertical or rotational nystagmus  Neck: Normal range of motion and full passive range of motion without pain. Neck supple.  Full active and passive ROM without pain No midline or paraspinal tenderness No nuchal rigidity  or meningeal signs  Cardiovascular: Normal rate, regular rhythm, normal heart sounds and intact distal pulses.   No murmur heard. Pulmonary/Chest: Effort normal. No stridor. No respiratory distress (Expiratory). She has wheezes. She has rhonchi. She has no rales. She exhibits no tenderness.  Chest tender to palpation  Abdominal: Soft. Bowel sounds are normal. There is no tenderness. There is no rebound and no guarding.  Musculoskeletal: Normal range of motion. She exhibits edema.  2+ nonpitting edema of the bilateral lower legs with venous stasis changes  Lymphadenopathy:    She has no cervical adenopathy.  Neurological: She is alert and oriented to person, place, and time. She has normal reflexes. No cranial nerve deficit. She exhibits normal muscle tone. Coordination normal.  Mental Status:  Alert, oriented, thought content appropriate. Speech fluent without evidence of aphasia. Able to follow 2 step commands without difficulty.   Cranial Nerves:  II:  Peripheral visual fields grossly normal, pupils equal, round, reactive to light III,IV, VI: ptosis not present, extra-ocular motions intact bilaterally  V,VII: smile symmetric, facial light touch sensation equal VIII: hearing grossly normal bilaterally  IX,X: midline uvula rise  XI: bilateral shoulder shrug equal and strong XII: midline tongue extension  Motor:  5/5 in upper and lower extremities bilaterally including strong and equal grip strength and dorsiflexion/plantar flexion Sensory: Pinprick and light touch normal in all extremities.  Deep Tendon Reflexes: 2+ and symmetric  Cerebellar: normal finger-to-nose with bilateral upper extremities Gait: normal gait and balance CV: distal pulses palpable throughout   Skin: Skin is warm and dry. No rash noted. She is not diaphoretic.  Psychiatric: She has a normal mood and affect. Her behavior is normal. Judgment and thought content normal.  Nursing note and vitals reviewed.   ED Course  Procedures (including critical care time)  DIAGNOSTIC STUDIES: Oxygen Saturation is 100% on RA, normal by my interpretation.    COORDINATION OF CARE: 12:13 AM Discussed treatment plan with pt at bedside which includes DG Chest 2 View and lab work and pt agreed to plan.  Labs Review Labs Reviewed  CBC WITH DIFFERENTIAL/PLATELET - Abnormal; Notable for the following:    WBC 14.6 (*)    Platelets 416 (*)    Neutro Abs 10.0 (*)    All other components within normal limits  BASIC METABOLIC PANEL - Abnormal; Notable for the following:    Potassium 3.4 (*)    Glucose, Bld 195 (*)    All other components within normal limits  BRAIN NATRIURETIC PEPTIDE  I-STAT TROPOININ, ED  Randolm Idol, ED    Imaging Review Dg Chest 2 View  02/06/2016  CLINICAL DATA:  65 year old female with chest pain EXAM: CHEST  2 VIEW COMPARISON:  Chest radiograph dated 08/29/2014 FINDINGS: Two views of the chest do not demonstrate a focal  consolidation. There is no pleural effusion or pneumothorax. The cardiac silhouette is within normal limits. There is slight prominence of the upper mediastinum compared to the prior study which may be artifactual and related to difference in patient positioning. CT with contrast is recommended if there is high clinical concern for mediastinal or aortic injury. No acute osseous pathology identified. IMPRESSION: No acute cardiopulmonary process. Minimal apparent widening of the upper mediastinum compared to prior study which may be related to patient positioning. CT with contrast may provide better evaluation if there is high clinical concern for aortic or mediastinal injury. Electronically Signed   By: Anner Crete M.D.   On: 02/06/2016 01:48   Ct Angio  Chest/abd/pel For Dissection W And/or W/wo  02/06/2016  CLINICAL DATA:  Headache with numbness in the right arm. Right-sided chest pain and tightness. EXAM: CT ANGIOGRAPHY CHEST, ABDOMEN AND PELVIS TECHNIQUE: Multidetector CT imaging through the chest, abdomen and pelvis was performed using the standard protocol during bolus administration of intravenous contrast. Multiplanar reconstructed images and MIPs were obtained and reviewed to evaluate the vascular anatomy. CONTRAST:  100 mL Isovue 370 COMPARISON:  None. FINDINGS: CTA CHEST FINDINGS Noncontrast CT images of the chest demonstrate mild calcification in the thoracic aorta. No evidence of intramural hematoma. Images obtained during arterial phase after IV contrast administration demonstrate normal caliber thoracic aorta. No evidence of aortic aneurysm or dissection. Great vessel origins are patent. Central pulmonary arteries are well opacified. No evidence of significant pulmonary embolus. Normal heart size. Esophagus is decompressed. No significant lymphadenopathy in the chest. 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 Consider follow-up with elective  ultrasound if clinically indicated. Evaluation of lungs is limited due to respiratory motion artifact. Minimal dependent changes in the lung bases. No focal airspace disease or consolidation. No pleural effusions. No pneumothorax. Airways are patent. Review of the MIP images confirms the above findings. CTA ABDOMEN AND PELVIS FINDINGS Normal caliber abdominal aorta. No evidence of aortic aneurysm or dissection. The abdominal aorta, celiac axis, superior mesenteric artery, single bilateral renal arteries, inferior mesenteric artery, and bilateral iliac, external iliac, internal iliac, and common femoral arteries are patent. Renal nephrograms are symmetrical. Diffuse fatty infiltration of the liver. The gallbladder, spleen, pancreas, adrenal glands, inferior vena cava, and retroperitoneal lymph nodes are unremarkable. Stomach, small bowel, and colon are decompressed. Stool throughout the colon. Small periumbilical hernia containing fat. No free air or free fluid in the abdomen. Pelvis: Prominent nodular enlargement of the uterus with calcifications consistent with multiple uterine fibroids. Ovaries are not enlarged. No significant pelvic lymphadenopathy. No free or loculated pelvic fluid collections. Bladder wall is not thickened. Appendix is normal. Degenerative changes demonstrated in the thoracic and lumbar spine. No destructive bone lesions. Review of the MIP images confirms the above findings. IMPRESSION: No evidence of thoracic or abdominal aortic aneurysm or dissection. No evidence of active pulmonary disease. No acute process demonstrated in the abdomen or pelvis. Fatty infiltration of the liver. 4.3 cm thyroid gland nodule. Large uterine fibroids. Small periumbilical hernia containing fat. Electronically Signed   By: Lucienne Capers M.D.   On: 02/06/2016 04:10   I have personally reviewed and evaluated these images and lab results as part of my medical decision-making.   EKG  Interpretation   Date/Time:  Saturday February 06 2016 00:48:26 EDT Ventricular Rate:  87 PR Interval:    QRS Duration: 91 QT Interval:  361 QTC Calculation: 435 R Axis:   0 Text Interpretation:  Sinus rhythm Nonspecific T abnormalities, diffuse  leads since last tracing no significant change as compared to EKG from  same day Confirmed by BELFI  MD, MELANIE ([id number]) on 02/06/2016 12:53:20 AM      MDM   Final diagnoses:  Nonintractable headache, unspecified chronicity pattern, unspecified headache type  Chest pain, unspecified chest pain type  Cough  Viral URI with cough   Rebbeca Paul presents with URI symptoms but also with complaints of chest pain.  Physical exam most consistent with URI.  EKG with nonspecific T abnormalities but no significant change from previous. Both initial and delta troponin are negative. Mild hypokalemia at 3.4, placed in the emergency department.  Mild leukocytosis of 14.6 noted. Patient has had this level of leukocytosis in the past.  His x-ray showed question increased mediastinal widening. CT scan shows no evidence of thoracic or abdominal aortic aneurysm or dissection. No evidence of pulmonary embolus. No evidence of pulmonary edema, pneumothorax or pneumonia. Patient is afebrile without evidence of sepsis. Initial triage vitals patient was noted to be tachycardic at 103 however she has had no tachycardia on my clinical exam or repeat exams.  Chest pain has improved significantly since her albuterol treatment. Her headache is resolved completely. She had a normal neurologic exam and no evidence of CVA.  Patient will be given symptomatic therapy and she is to follow-up with her primary care physician.  Discussed reasons return to the emergency including worsening or changing chest pain.  I personally performed the services described in this documentation, which was scribed in my presence. The recorded information has been reviewed and is accurate.  BP 155/84 mmHg   Pulse 85  Temp(Src) 98.5 F (36.9 C) (Oral)  Resp 30  Ht 5\' 1"  (1.549 m)  Wt 100.245 kg  BMI 41.78 kg/m2  SpO2 97%  LMP 04/08/2013   Abigail Butts, PA-C 02/06/16 [id number]  Malvin Johns, MD 02/06/16 [phone]

## 2016-02-06 ENCOUNTER — Emergency Department (HOSPITAL_COMMUNITY): Payer: Medicaid Other

## 2016-02-06 LAB — CBC WITH DIFFERENTIAL/PLATELET
Basophils Absolute: 0 10*3/uL (ref 0.0–0.1)
Basophils Relative: 0 %
EOS ABS: 0.3 10*3/uL (ref 0.0–0.7)
Eosinophils Relative: 2 %
HEMATOCRIT: 37.5 % (ref 36.0–46.0)
HEMOGLOBIN: 12.6 g/dL (ref 12.0–15.0)
LYMPHS ABS: 3.4 10*3/uL (ref 0.7–4.0)
Lymphocytes Relative: 23 %
MCH: 30.4 pg (ref 26.0–34.0)
MCHC: 33.6 g/dL (ref 30.0–36.0)
MCV: 90.6 fL (ref 78.0–100.0)
MONOS PCT: 6 %
Monocytes Absolute: 0.9 10*3/uL (ref 0.1–1.0)
NEUTROS ABS: 10 10*3/uL — AB (ref 1.7–7.7)
NEUTROS PCT: 69 %
Platelets: 416 10*3/uL — ABNORMAL HIGH (ref 150–400)
RBC: 4.14 MIL/uL (ref 3.87–5.11)
RDW: 14.2 % (ref 11.5–15.5)
WBC: 14.6 10*3/uL — AB (ref 4.0–10.5)

## 2016-02-06 LAB — BASIC METABOLIC PANEL
Anion gap: 8 (ref 5–15)
BUN: 10 mg/dL (ref 6–20)
CHLORIDE: 103 mmol/L (ref 101–111)
CO2: 26 mmol/L (ref 22–32)
Calcium: 9 mg/dL (ref 8.9–10.3)
Creatinine, Ser: 0.91 mg/dL (ref 0.44–1.00)
GFR calc non Af Amer: >60 mL/min (ref >60)
Glucose, Bld: 195 mg/dL — ABNORMAL HIGH (ref 65–99)
POTASSIUM: 3.4 mmol/L — AB (ref 3.5–5.1)
SODIUM: 137 mmol/L (ref 135–145)

## 2016-02-06 LAB — I-STAT TROPONIN, ED
TROPONIN I, POC: 0 ng/mL (ref 0.00–0.08)
TROPONIN I, POC: 0 ng/mL (ref 0.00–0.08)

## 2016-02-06 LAB — BRAIN NATRIURETIC PEPTIDE: B Natriuretic Peptide: 25.8 pg/mL (ref 0.0–100.0)

## 2016-02-06 MED ORDER — SODIUM CHLORIDE 0.9 % IV BOLUS (SEPSIS)
500.0000 mL | Freq: Once | INTRAVENOUS | Status: AC
  Administered 2016-02-06: 500 mL via INTRAVENOUS

## 2016-02-06 MED ORDER — PROCHLORPERAZINE EDISYLATE 5 MG/ML IJ SOLN
10.0000 mg | Freq: Once | INTRAMUSCULAR | Status: AC
  Administered 2016-02-06: 10 mg via INTRAVENOUS
  Filled 2016-02-06: qty 2

## 2016-02-06 MED ORDER — AEROCHAMBER PLUS FLO-VU MEDIUM MISC
1.0000 | Freq: Once | Status: AC
  Administered 2016-02-06: 1
  Filled 2016-02-06 (×2): qty 1

## 2016-02-06 MED ORDER — POTASSIUM CHLORIDE CRYS ER 20 MEQ PO TBCR
40.0000 meq | EXTENDED_RELEASE_TABLET | Freq: Once | ORAL | Status: AC
  Administered 2016-02-06: 40 meq via ORAL
  Filled 2016-02-06: qty 2

## 2016-02-06 MED ORDER — ALBUTEROL SULFATE HFA 108 (90 BASE) MCG/ACT IN AERS
2.0000 | INHALATION_SPRAY | RESPIRATORY_TRACT | Status: DC | PRN
  Administered 2016-02-06: 2 via RESPIRATORY_TRACT
  Filled 2016-02-06: qty 6.7

## 2016-02-06 MED ORDER — IPRATROPIUM BROMIDE 0.02 % IN SOLN
0.5000 mg | Freq: Once | RESPIRATORY_TRACT | Status: AC
  Administered 2016-02-06: 0.5 mg via RESPIRATORY_TRACT
  Filled 2016-02-06: qty 2.5

## 2016-02-06 MED ORDER — IOPAMIDOL (ISOVUE-370) INJECTION 76%
100.0000 mL | Freq: Once | INTRAVENOUS | Status: AC | PRN
  Administered 2016-02-06: 100 mL via INTRAVENOUS

## 2016-02-06 MED ORDER — ALBUTEROL SULFATE (2.5 MG/3ML) 0.083% IN NEBU
5.0000 mg | INHALATION_SOLUTION | Freq: Once | RESPIRATORY_TRACT | Status: AC
  Administered 2016-02-06: 5 mg via RESPIRATORY_TRACT
  Filled 2016-02-06: qty 6

## 2016-02-06 NOTE — ED Notes (Signed)
Patient transported to CT 

## 2016-02-06 NOTE — ED Notes (Signed)
Returned from CT.

## 2016-02-06 NOTE — Discharge Instructions (Signed)
1. Medications: mucinex, albuterol, usual home medications 2. Treatment: rest, drink plenty of fluids, take tylenol or ibuprofen for fever control 3. Follow Up: Please followup with your primary doctor in 3 days for discussion of your diagnoses and further evaluation after today's visit; if you do not have a primary care doctor use the resource guide provided to find one; Return to the ER for high fevers, difficulty breathing or other concerning symptoms

## 2016-02-17 ENCOUNTER — Ambulatory Visit: Payer: Medicaid Other

## 2016-02-18 ENCOUNTER — Ambulatory Visit: Payer: Medicaid Other

## 2016-04-15 ENCOUNTER — Ambulatory Visit: Payer: Medicaid Other | Admitting: Adult Health

## 2016-04-21 ENCOUNTER — Ambulatory Visit: Payer: Medicaid Other

## 2016-07-17 ENCOUNTER — Emergency Department (HOSPITAL_COMMUNITY)
Admission: EM | Admit: 2016-07-17 | Discharge: 2016-07-17 | Disposition: A | Discharge status: Home / Self Care | Payer: Medicaid Other | Attending: Emergency Medicine | Admitting: Emergency Medicine

## 2016-07-17 ENCOUNTER — Emergency Department (HOSPITAL_COMMUNITY): Payer: Medicaid Other

## 2016-07-17 ENCOUNTER — Encounter (HOSPITAL_COMMUNITY): Payer: Self-pay

## 2016-07-17 DIAGNOSIS — J45901 Unspecified asthma with (acute) exacerbation: Secondary | ICD-10-CM

## 2016-07-17 DIAGNOSIS — Z79899 Other long term (current) drug therapy: Secondary | ICD-10-CM | POA: Insufficient documentation

## 2016-07-17 DIAGNOSIS — J0100 Acute maxillary sinusitis, unspecified: Secondary | ICD-10-CM

## 2016-07-17 DIAGNOSIS — Z7984 Long term (current) use of oral hypoglycemic drugs: Secondary | ICD-10-CM | POA: Diagnosis not present

## 2016-07-17 DIAGNOSIS — R05 Cough: Secondary | ICD-10-CM | POA: Diagnosis present

## 2016-07-17 DIAGNOSIS — E119 Type 2 diabetes mellitus without complications: Secondary | ICD-10-CM | POA: Insufficient documentation

## 2016-07-17 DIAGNOSIS — I10 Essential (primary) hypertension: Secondary | ICD-10-CM | POA: Insufficient documentation

## 2016-07-17 LAB — URINALYSIS, ROUTINE W REFLEX MICROSCOPIC
Bacteria, UA: NONE SEEN
Bilirubin Urine: NEGATIVE
Glucose, UA: NEGATIVE mg/dL
Hgb urine dipstick: NEGATIVE
Ketones, ur: NEGATIVE mg/dL
Nitrite: NEGATIVE
Protein, ur: 100 mg/dL — AB
Specific Gravity, Urine: 1.02 (ref 1.005–1.030)
pH: 5 (ref 5.0–8.0)

## 2016-07-17 MED ORDER — PREDNISONE 20 MG PO TABS
60.0000 mg | ORAL_TABLET | Freq: Once | ORAL | Status: AC
  Administered 2016-07-17: 60 mg via ORAL
  Filled 2016-07-17: qty 3

## 2016-07-17 MED ORDER — PREDNISONE 20 MG PO TABS: 60.0000 mg | ORAL_TABLET | Freq: Every day | ORAL | 0 refills | Status: DC

## 2016-07-17 MED ORDER — BENZONATATE 100 MG PO CAPS: 100.0000 mg | ORAL_CAPSULE | Freq: Three times a day (TID) | ORAL | 0 refills | Status: DC

## 2016-07-17 MED ORDER — ALBUTEROL SULFATE (2.5 MG/3ML) 0.083% IN NEBU
5.0000 mg | INHALATION_SOLUTION | Freq: Once | RESPIRATORY_TRACT | Status: AC
  Administered 2016-07-17: 5 mg via RESPIRATORY_TRACT
  Filled 2016-07-17: qty 6

## 2016-07-17 MED ORDER — AZITHROMYCIN 250 MG PO TABS: 250.0000 mg | ORAL_TABLET | Freq: Every day | ORAL | 0 refills | Status: DC

## 2016-07-17 MED ORDER — ALBUTEROL SULFATE HFA 108 (90 BASE) MCG/ACT IN AERS: 1.0000 | INHALATION_SPRAY | Freq: Four times a day (QID) | RESPIRATORY_TRACT | 0 refills | Status: DC | PRN

## 2016-07-17 NOTE — Discharge Instructions (Signed)
Medications: albuterol inhaler, prednisone, Zithromax, Tessalon   Treatment: Use 1-2 puffs of albuterol inhaler every 4-6 hours as needed for wheezing or shortness of breath. Take prednisone as prescribed for 5 days total. You have received your first dose today.Take Zithromax as prescribed. Take Tessalon every 8 hours as needed for cough.  Follow-up: Please follow-up with your primary care provider this week for recheck and further evaluation of your urinary frequency. A urine culture will be sent and you will be called if you require an antibiotic for treatment. Please return to emergency department if you develop any new or worsening symptoms.

## 2016-07-17 NOTE — ED Notes (Signed)
Pt states that she will go to the restroom for urine sample after treatment finishes.

## 2016-07-17 NOTE — ED Provider Notes (Signed)
[facility] DEPT Provider Note   CSN: [id number] Arrival date & time: 07/17/16  1546  By signing my name below, I, Reola Mosher, attest that this documentation has been prepared under the direction and in the presence of Eliezer Mccoy, PA-C.  Electronically Signed: Reola Mosher, ED Scribe. 07/17/16. 5:59 PM.  History   Chief Complaint Chief Complaint  Patient presents with  . Cough  . Nasal Congestion   The history is provided by the patient. No language interpreter was used.   HPI Comments: Mariah [name] is a 65 y.o. female with a PMHx of DM and asthma, who presents to the Emergency Department complaining of gradually worsening, persistent productive cough with green-yellow sputum onset approximately four days ago. She notes associated nasal/chest congestion, wheezing, and sinus pain/pressure secondary to her cough. Pt reports that she has recurrent sinusitis yearly and with the seasonal changes, and that her symptoms are similar to her typically flare-ups of this. Pt is typically covered by her PCP for these flare-ups with Azithromycin packs with relief during these episodes. She has been using her at home Albuterol inhaler, Cold/Sinus tablets, and Allegra with minimal relief of her current symptoms. Pt also notes that she has had possible urinary frequency over the past month, but states that she is unsure if the amount of urine voids that she has had is away from her baseline. She denies fever, chest pain, abdominal pain, shortness of breath, nausea, vomiting, urgency, hematuria, dysuria, difficulty urinating, or any other associated symptoms.   Past Medical History:  Diagnosis Date  . Allergic rhinitis   . Arthritis    right anke/foot  . Asthma   . Bursitis   . Carpal tunnel syndrome   . Diabetes mellitus without complication ([place])   . GERD (gastroesophageal reflux disease)   . HTN (hypertension)   . Knee pain   . Obesity   . Sciatica   . Tendinitis   .  Thumb pain    right   Patient Active Problem List   Diagnosis Date Noted  . Sciatica   . Asthma with acute exacerbation 04/10/2014  . Sinusitis, acute 06/03/2013  . Morbid obesity ([place]) 10/21/2012  . Left buttock abscess s/p I&D 10/21/2012 10/20/2012  . Uncontrolled type 2 diabetes mellitus with complication ([place]) [id number]  . CELLULITIS AND ABSCESS OF LEG EXCEPT FOOT 10/01/2010  . HYPERGLYCEMIA 06/14/2010  . BACKGROUND DIABETIC RETINOPATHY 05/05/2010  . ARTHRITIS,RIGHT ANKLE/FOOT 09/03/2009  . MEDIAL EPICONDYLITIS, LEFT 01/06/2009  . FATIGUE 01/06/2009  . FIBROIDS, UTERUS 07/10/2008  . PERIPHERAL EDEMA 07/10/2008  . OTHER DENTAL CARIES 03/04/2008  . ACUTE BRONCHITIS 09/17/2007  . THUMB PAIN, RIGHT 06/12/2007  . HYPERTENSION 04/26/2007  . Allergic rhinitis 04/26/2007  . Asthma 04/26/2007  . GASTROESOPHAGEAL REFLUX DISEASE 04/26/2007  . KNEE PAIN 04/26/2007  . TENDINITIS 04/26/2007   Past Surgical History:  Procedure Laterality Date  . arthroscopic left knee surgery  2003  . repeat fibroid excision  1997   Dr [name]  . right heel surgery on plantar facitis  1995  . UTERINE FIBROID SURGERY  1988   Dr [name]  . WRIST SURGERY     OB History    No data available     Home Medications    Prior to Admission medications   Medication Sig Start Date End Date Taking? Authorizing Provider  albuterol (PROVENTIL HFA;VENTOLIN HFA) 108 (90 Base) MCG/ACT inhaler Inhale 1-2 puffs into the lungs every 6 (six) hours as needed for wheezing or shortness of  breath. 07/17/16   Bea Graff Jakylah Bassinger, PA-C  amLODipine (NORVASC) 10 MG tablet Take 10 mg by mouth daily.    Historical Provider, MD  azithromycin (ZITHROMAX) 250 MG tablet Take 1 tablet (250 mg total) by mouth daily. Take first 2 tablets together, then 1 every day until finished. 07/17/16   Nasreen Goedecke M Nikalas Bramel, PA-C  benzonatate (TESSALON) 100 MG capsule Take 1 capsule (100 mg total) by mouth every 8 (eight) hours. 07/17/16   Frederica Kuster, PA-C   celecoxib (CELEBREX) 50 MG capsule Take 1 capsule (50 mg total) by mouth 2 (two) times daily. 12/19/14   Tiffany Carlota Raspberry, PA-C  cetirizine (ZYRTEC) 10 MG tablet Take 10 mg by mouth daily.    Historical Provider, MD  cloNIDine (CATAPRES) 0.2 MG tablet Take 1 tablet (0.2 mg total) by mouth 3 (three) times daily. 10/23/12   Ripudeep Krystal Eaton, MD  cyclobenzaprine (FLEXERIL) 10 MG tablet Take 10 mg by mouth 3 (three) times daily as needed for muscle spasms.    Historical Provider, MD  esomeprazole (NEXIUM) 20 MG capsule Take 20 mg by mouth every morning.    Historical Provider, MD  Fluticasone-Salmeterol (ADVAIR DISKUS) 100-50 MCG/DOSE AEPB Inhale 1 puff into the lungs 2 (two) times daily. 10/14/14   Rigoberto Noel, MD  furosemide (LASIX) 40 MG tablet Take 40 mg by mouth daily.  04/22/13   Tammy S Parrett, NP  glipiZIDE (GLUCOTROL) 10 MG tablet Take 10 mg by mouth 2 (two) times daily before a meal.    Historical Provider, MD  Menthol (BIOFREEZE) 10 % AERO Apply 1 application topically 3 (three) times daily as needed (pain). Reported on 11/18/2015    Historical Provider, MD  metFORMIN (GLUCOPHAGE-XR) 500 MG 24 hr tablet Take 1,000 mg by mouth 2 (two) times daily.    Historical Provider, MD  mometasone (NASONEX) 50 MCG/ACT nasal spray Place 2 sprays into the nose daily. 10/14/14   Rigoberto Noel, MD  predniSONE (DELTASONE) 20 MG tablet Take 3 tablets (60 mg total) by mouth daily. 07/17/16   Frederica Kuster, PA-C  traMADol (ULTRAM) 50 MG tablet Take 50 mg by mouth every 6 (six) hours as needed for moderate pain.     Historical Provider, MD   Family History Family History  Problem Relation Age of Onset  . Uterine cancer Mother   . Hypertension Mother   . Hypertension Father   . Alzheimer's disease Father   . Cancer Other    Social History Social History  Substance Use Topics  . Smoking status: Never Smoker  . Smokeless tobacco: Never Used  . Alcohol use No   Allergies   Codeine; Doxycycline; Indomethacin;  and Penicillins  Review of Systems Review of Systems  Constitutional: Negative for chills and fever.  HENT: Positive for congestion, sinus pain and sinus pressure. Negative for facial swelling and sore throat.   Respiratory: Positive for cough and wheezing. Negative for shortness of breath.   Cardiovascular: Negative for chest pain.  Gastrointestinal: Negative for abdominal pain, nausea and vomiting.  Genitourinary: Positive for frequency. Negative for difficulty urinating, dysuria, hematuria and urgency.  Musculoskeletal: Negative for back pain.  Skin: Negative for rash and wound.  Neurological: Negative for headaches.  Psychiatric/Behavioral: The patient is not nervous/anxious.    Physical Exam Updated Vital Signs BP 161/72 (BP Location: Left Arm)   Pulse 94   Temp 98.8 F (37.1 C) (Oral)   Resp 16   Ht 5\' 1"  (1.549 m)   Wt 90.7  kg   LMP 04/08/2013   SpO2 97%   BMI 37.79 kg/m   Physical Exam  Constitutional: She appears well-developed and well-nourished. No distress.  HENT:  Head: Normocephalic and atraumatic.  Nose: Right sinus exhibits maxillary sinus tenderness. Right sinus exhibits no frontal sinus tenderness. Left sinus exhibits maxillary sinus tenderness. Left sinus exhibits no frontal sinus tenderness.  Mouth/Throat: Oropharynx is clear and moist. No oropharyngeal exudate.  Eyes: Conjunctivae are normal. Pupils are equal, round, and reactive to light. Right eye exhibits no discharge. Left eye exhibits no discharge. No scleral icterus.  Neck: Normal range of motion. Neck supple. No thyromegaly present.  Cardiovascular: Normal rate, regular rhythm, normal heart sounds and intact distal pulses.  Exam reveals no gallop and no friction rub.   No murmur heard. Pulmonary/Chest: Effort normal. No stridor. No respiratory distress. She has wheezes.  Diffuse inspiratory and expiratory wheezes bilaterally. Rhonchi are noted bilaterally as well.   Abdominal: Soft. Bowel sounds  are normal. She exhibits no distension. There is no tenderness. There is no rebound and no guarding.  Musculoskeletal: She exhibits no edema.  Lymphadenopathy:    She has no cervical adenopathy.  Neurological: She is alert. Coordination normal.  Skin: Skin is warm and dry. No rash noted. She is not diaphoretic. No pallor.  Psychiatric: She has a normal mood and affect.  Nursing note and vitals reviewed.  ED Treatments / Results  DIAGNOSTIC STUDIES: Oxygen Saturation is 97% on RA, normal by my interpretation.   COORDINATION OF CARE: 5:57 PM-Discussed next steps with pt. Pt verbalized understanding and is agreeable with the plan.   Labs (all labs ordered are listed, but only abnormal results are displayed) Labs Reviewed  URINALYSIS, ROUTINE W REFLEX MICROSCOPIC - Abnormal; Notable for the following:       Result Value   Protein, ur 100 (*)    Leukocytes, UA TRACE (*)    Squamous Epithelial / LPF 0-5 (*)    All other components within normal limits  URINE CULTURE   Radiology Dg Chest 2 View  Result Date: 07/17/2016 CLINICAL DATA:  Productive cough for several days, initial encounter EXAM: CHEST  2 VIEW COMPARISON:  02/06/2016 FINDINGS: The heart size and mediastinal contours are within normal limits. Both lungs are clear. The visualized skeletal structures are unremarkable. IMPRESSION: No active cardiopulmonary disease. Electronically Signed   By: Inez Catalina M.D.   On: 07/17/2016 16:46   Procedures Procedures   Medications Ordered in ED Medications  predniSONE (DELTASONE) tablet 60 mg (not administered)  albuterol (PROVENTIL) (2.5 MG/3ML) 0.083% nebulizer solution 5 mg (5 mg Nebulization Given 07/17/16 1802)  albuterol (PROVENTIL) (2.5 MG/3ML) 0.083% nebulizer solution 5 mg (5 mg Nebulization Given 07/17/16 1919)    Initial Impression / Assessment and Plan / ED Course  I have reviewed the triage vital signs and the nursing notes.  Pertinent labs & imaging results that  were available during my care of the patient were reviewed by me and considered in my medical decision making (see chart for details).  Clinical Course    Patient complaining of symptoms of sinusitis and mild acute asthma exacerbation.     Mild to moderate symptoms of clear/yellow nasal discharge/congestion and scratchy throat with cough for less than 10 days. Patient is afebrile. No concern for acute bacterial rhinosinusitis; likely viral in nature.  Patient discharged with symptomatic treatment and preemptively covered with Azithromycin due to significant maxillary tenderness. Patient instructions given for warm saline nasal washes.   Patient  also with mild signs and symptoms of asthma/RAD. Oxygen saturation is above 97% on RA throughout ED course. No accessory muscle use, no cyanosis, however, with diffuse wheezes and rhonchi. CXR obtained which was negative for acute infiltrate. Treated in the ED with nebulizing breathing treatment. Patient feels much improved after treatment. Wheezes are improved on lung exam, however repeat neb was given for further treatment of residual wheezes. Will discharge with burst of Prednisone, refill of albuterol inhaler, and Tessalon Perles.   U/A was also obtained d/t pt c/o possible urinary frequency over the past month. UA shows trace leukocytes, 0-5 squamous epithelial cells. Urine culture sent and was treated, however I suspect contamination.  Pt is comfortable with above plan and is stable for discharge at this time. All questions were answered prior to disposition. Follow up with PCP for recheck and 3-4 days. Strict return precautions for return into the ED were discussed.     Final Clinical Impressions(s) / ED Diagnoses   Final diagnoses:  Exacerbation of asthma, unspecified asthma severity, unspecified whether persistent  Acute maxillary sinusitis, recurrence not specified   New Prescriptions New Prescriptions   ALBUTEROL (PROVENTIL HFA;VENTOLIN HFA)  108 (90 BASE) MCG/ACT INHALER    Inhale 1-2 puffs into the lungs every 6 (six) hours as needed for wheezing or shortness of breath.   AZITHROMYCIN (ZITHROMAX) 250 MG TABLET    Take 1 tablet (250 mg total) by mouth daily. Take first 2 tablets together, then 1 every day until finished.   BENZONATATE (TESSALON) 100 MG CAPSULE    Take 1 capsule (100 mg total) by mouth every 8 (eight) hours.   PREDNISONE (DELTASONE) 20 MG TABLET    Take 3 tablets (60 mg total) by mouth daily.   I personally performed the services described in this documentation, which was scribed in my presence. The recorded information has been reviewed and is accurate.     Frederica Kuster, PA-C 07/17/16 1939    Duffy Bruce, MD 07/18/16 [phone]

## 2016-07-17 NOTE — ED Triage Notes (Signed)
PT C/O PRODUCTIVE COUGH WITH YELLOW MUCUS, NASAL  AND CHEST CONGESTION, AND SINUS PRESSURE WITH FACIAL PAIN SINCE Wednesday. PT DENIES FEVER. PT STS SHE HAD SIMILAR SYMPTOMS A FEW MONTHS AGO, AS WELL.

## 2016-07-19 LAB — URINE CULTURE: Special Requests: NORMAL

## 2016-11-30 ENCOUNTER — Encounter: Payer: Self-pay | Admitting: Primary Care

## 2017-03-25 DIAGNOSIS — M542 Cervicalgia: Secondary | ICD-10-CM | POA: Insufficient documentation

## 2017-03-25 DIAGNOSIS — Z5321 Procedure and treatment not carried out due to patient leaving prior to being seen by health care provider: Secondary | ICD-10-CM | POA: Diagnosis not present

## 2017-03-26 ENCOUNTER — Encounter (HOSPITAL_COMMUNITY): Payer: Self-pay | Admitting: Nurse Practitioner

## 2017-03-26 ENCOUNTER — Emergency Department (HOSPITAL_COMMUNITY)
Admission: EM | Admit: 2017-03-26 | Discharge: 2017-03-26 | Disposition: A | Discharge status: Home / Self Care | Payer: Medicaid Other | Attending: Emergency Medicine | Admitting: Emergency Medicine

## 2017-03-26 NOTE — ED Notes (Signed)
Bed: [facility] Expected date:  Expected time:  Means of arrival:  Comments: 
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
## 2017-03-26 NOTE — ED Notes (Signed)
Patient did not respond when trying to obtain V/S in the lobby.  Patient did not inform staff of leaving the facility.

## 2017-04-17 ENCOUNTER — Ambulatory Visit: Payer: Medicaid Other

## 2017-04-19 ENCOUNTER — Encounter: Payer: Self-pay | Admitting: Physical Therapy

## 2017-04-19 ENCOUNTER — Ambulatory Visit: Payer: Medicaid Other | Attending: Primary Care | Admitting: Physical Therapy

## 2017-04-19 DIAGNOSIS — M545 Low back pain, unspecified: Secondary | ICD-10-CM

## 2017-04-19 DIAGNOSIS — M542 Cervicalgia: Secondary | ICD-10-CM | POA: Insufficient documentation

## 2017-04-19 DIAGNOSIS — R293 Abnormal posture: Secondary | ICD-10-CM | POA: Insufficient documentation

## 2017-04-19 DIAGNOSIS — R262 Difficulty in walking, not elsewhere classified: Secondary | ICD-10-CM | POA: Diagnosis present

## 2017-04-20 ENCOUNTER — Encounter: Payer: Self-pay | Admitting: Physical Therapy
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
## 2017-05-03 ENCOUNTER — Ambulatory Visit: Payer: Medicaid Other | Attending: Primary Care | Admitting: Physical Therapy

## 2017-05-03 DIAGNOSIS — M545 Low back pain: Secondary | ICD-10-CM | POA: Insufficient documentation

## 2017-05-03 DIAGNOSIS — R293 Abnormal posture: Secondary | ICD-10-CM | POA: Insufficient documentation

## 2017-05-03 DIAGNOSIS — M542 Cervicalgia: Secondary | ICD-10-CM | POA: Insufficient documentation

## 2017-05-03 DIAGNOSIS — R262 Difficulty in walking, not elsewhere classified: Secondary | ICD-10-CM | POA: Insufficient documentation

## 2017-05-11 ENCOUNTER — Ambulatory Visit: Payer: Medicaid Other | Admitting: Physical Therapy

## 2017-05-11 DIAGNOSIS — M545 Low back pain, unspecified: Secondary | ICD-10-CM

## 2017-05-11 DIAGNOSIS — R262 Difficulty in walking, not elsewhere classified: Secondary | ICD-10-CM | POA: Diagnosis present

## 2017-05-11 DIAGNOSIS — R293 Abnormal posture: Secondary | ICD-10-CM | POA: Diagnosis present

## 2017-05-11 DIAGNOSIS — M542 Cervicalgia: Secondary | ICD-10-CM | POA: Diagnosis present

## 2017-05-11 NOTE — Therapy (Addendum)
[place], [place], [address] Phone: [phone]   Fax:  [phone]  Physical Therapy Treatment  Patient Details  Name: Mariah [name] MRN: [id number] Date of Birth: 09/05/1950 Referring Provider: Juluis Mire NP   Encounter Date: 05/11/2017      PT End of Session - 05/11/17 1324    Visit Number 2   Number of Visits 4   Date for PT Re-Evaluation 05/18/17   Authorization Type medicaid will recertify for more visits if medically nessecary    PT Start Time 1315  15 minutes late   PT Stop Time 1350   PT Time Calculation (min) 35 min      Past Medical History:  Diagnosis Date  . Allergic rhinitis   . Arthritis    right anke/foot  . Asthma   . Bursitis   . Carpal tunnel syndrome   . Diabetes mellitus without complication ([place])   . GERD (gastroesophageal reflux disease)   . HTN (hypertension)   . Knee pain   . Obesity   . Sciatica   . Tendinitis   . Thumb pain    right    Past Surgical History:  Procedure Laterality Date  . arthroscopic left knee surgery  2003  . repeat fibroid excision  1997   Dr [name]  . right heel surgery on plantar facitis  1995  . UTERINE FIBROID SURGERY  1988   Dr [name]  . WRIST SURGERY      There were no vitals filed for this visit.      Subjective Assessment - 05/11/17 1317    Subjective An elevator is out in my building so I had to wait on the elevator a long time.    Pain Location Neck  stiffness   Pain Orientation Left   Aggravating Factors  cervical rotation, first get up in morning, washing my neck    Pain Relieving Factors warm wash cloth   Multiple Pain Sites Yes   Pain Score 0  up to 8/10 with aggravators    Pain Location Back   Pain Orientation Lower   Pain Descriptors / Indicators Sharp   Aggravating Factors  getting up and down, housework    Pain Relieving Factors stretching forward per HEP , heat             [facility] PT Assessment -  05/11/17 0001      PROM   Overall PROM Comments 50 degrees left hip AAROM with sheet, 70 on right                      [facility] Adult PT Treatment/Exercise - 05/11/17 0001      Lumbar Exercises: Seated   Other Seated Lumbar Exercises seated with hands on cane flexion and ateral flexion 2x5 each    Other Seated Lumbar Exercises supine heel slides with strap x 3 each      Neck Exercises: Stretches   Upper Trapezius Stretch Limitations advised only in pain free range to the right for the left 2x10 sec                 PT Education - 05/11/17 1347    Education provided Yes   Education Details HEP   Person(s) Educated Patient   Methods Explanation;Handout   Comprehension Verbalized understanding          PT Short Term Goals - 04/20/17 1324      PT SHORT TERM GOAL #  1   Title Patient will increase bilateral UE strength to 4+/5    Baseline 4/5 flexion IR / ER  3+/5 abduction bilateral    Target Date 05/18/17     PT SHORT TERM GOAL #2   Title Patient will be independent with basic exercises to improve movement    Baseline can perfrom only limited movements    Time 4   Period Weeks   Status New   Target Date 05/18/17     PT SHORT TERM GOAL #3   Title Patient will transfer sit to stand with least restrictive assistive device with SBA    Baseline Min to mod a to stand for chair safely    Time 4   Period Weeks   Status New   Target Date 05/18/17     PT SHORT TERM GOAL #4   Title Patient will improve bilateral hip flexion to 50 degrees without pain    Baseline 28 degrees on right 20 degrees on left with pain    Time 4   Period Weeks   Status New           PT Long Term Goals - 04/20/17 1501      PT LONG TERM GOAL #1   Title Patient will stand for 10 minutes without increased pain in order to peform household chores    Baseline < 5 minutes of standing    Time 8   Period Weeks   Status New   Target Date 06/15/17     PT LONG TERM GOAL #2    Title Patient will walk 600' with LRAD with < 3/10 pain in her neck back and legs    Baseline difficulty ambualting more then 51' with cane and crutch    Time 8   Period Weeks   Status New   Target Date 06/15/17     PT LONG TERM GOAL #3   Title Patient will demostrate a 41% limitation on FOTO    Baseline 61% limitation on FOTO    Time 8   Period Weeks   Status New               Plan - 05/11/17 1330    Clinical Impression Statement Pt arrived 15 minutes late using 1 crutch and 1 RW. Per pt, the triage RN is working on getting her a hover round and a Radiation protection practitioner. She will have LE vein surgery and then both knees replaced. She also plans on having a "sleeve" surgery for weight loss to help control newly diagnosed diabetes. She reports she fell asleep in the bathroom last night after brushing her teeth and did not wake up until the morning. She reports trying the seated stretches with her crutch and the neck stretches. She has no pain today however reports neck stiffness in the morning and sharp back pain with getting up from a chair and attempting to clean up at home. Review of supine heel slides with strap as pt has not tried this. Left knee more limited and painful. Tried yellow band seated scapular work which pt did well with. Updated HEP with yellow band exercises.    PT Next Visit Plan review use of rolling walker; review HEP; manual therapy to her neck   PT Home Exercise Plan setaed forward stretch with her cane and side to side; cervical rotation in pain free range 2-3x; bilateral heel slide with strap; yellow band horizontal abduction and ER    Consulted and Agree with Plan of  Care Patient      Patient will benefit from skilled therapeutic intervention in order to improve the following deficits and impairments:  Abnormal gait, Decreased activity tolerance, Pain, Increased edema, Decreased strength, Decreased mobility, Decreased knowledge of use of DME, Decreased balance, Increased  muscle spasms, Decreased endurance, Difficulty walking, Impaired tone, Decreased range of motion  Visit Diagnosis: Acute bilateral low back pain without sciatica  Cervicalgia  Abnormal posture  Difficulty in walking, not elsewhere classified     Problem List Patient Active Problem List   Diagnosis Date Noted  . Sciatica   . Asthma with acute exacerbation 04/10/2014  . Sinusitis, acute 06/03/2013  . Morbid obesity ([place]) 10/21/2012  . Left buttock abscess s/p I&D 10/21/2012 10/20/2012  . Uncontrolled type 2 diabetes mellitus with complication ([place]) 73/53/2992  . CELLULITIS AND ABSCESS OF LEG EXCEPT FOOT 10/01/2010  . HYPERGLYCEMIA 06/14/2010  . BACKGROUND DIABETIC RETINOPATHY 05/05/2010  . ARTHRITIS,RIGHT ANKLE/FOOT 09/03/2009  . MEDIAL EPICONDYLITIS, LEFT 01/06/2009  . FATIGUE 01/06/2009  . FIBROIDS, UTERUS 07/10/2008  . PERIPHERAL EDEMA 07/10/2008  . OTHER DENTAL CARIES 03/04/2008  . ACUTE BRONCHITIS 09/17/2007  . THUMB PAIN, RIGHT 06/12/2007  . HYPERTENSION 04/26/2007  . Allergic rhinitis 04/26/2007  . Asthma 04/26/2007  . GASTROESOPHAGEAL REFLUX DISEASE 04/26/2007  . KNEE PAIN 04/26/2007  . TENDINITIS 04/26/2007    Dorene Ar, PTA 05/11/2017, 2:08 PM  [facility] [address] [place], [place], [address] Phone: [phone]   Fax:  [phone]  Name: Mariah [name] MRN: [id number] Date of Birth: January 09, 1951

## 2017-05-23 ENCOUNTER — Ambulatory Visit: Payer: Medicaid Other | Admitting: Physical Therapy

## 2017-05-23 ENCOUNTER — Encounter: Payer: Self-pay | Admitting: Physical Therapy

## 2017-05-23 DIAGNOSIS — R293 Abnormal posture: Secondary | ICD-10-CM

## 2017-05-23 DIAGNOSIS — M542 Cervicalgia: Secondary | ICD-10-CM

## 2017-05-23 DIAGNOSIS — M545 Low back pain, unspecified: Secondary | ICD-10-CM

## 2017-05-23 DIAGNOSIS — R262 Difficulty in walking, not elsewhere classified: Secondary | ICD-10-CM

## 2017-05-24 ENCOUNTER — Encounter: Payer: Self-pay | Admitting: Physical Therapy

## 2017-05-24 ENCOUNTER — Encounter: Payer: Self-pay | Admitting: Internal Medicine

## 2017-05-24 ENCOUNTER — Ambulatory Visit (INDEPENDENT_AMBULATORY_CARE_PROVIDER_SITE_OTHER): Payer: Medicaid Other | Admitting: Internal Medicine

## 2017-05-24 VITALS — BP 155/69 | HR 93 | Temp 98.2°F | Ht 61.0 in | Wt 233.8 lb

## 2017-05-24 DIAGNOSIS — Z7984 Long term (current) use of oral hypoglycemic drugs: Secondary | ICD-10-CM | POA: Diagnosis not present

## 2017-05-24 DIAGNOSIS — E1159 Type 2 diabetes mellitus with other circulatory complications: Secondary | ICD-10-CM

## 2017-05-24 DIAGNOSIS — Z8049 Family history of malignant neoplasm of other genital organs: Secondary | ICD-10-CM | POA: Diagnosis not present

## 2017-05-24 DIAGNOSIS — Z7951 Long term (current) use of inhaled steroids: Secondary | ICD-10-CM | POA: Diagnosis not present

## 2017-05-24 DIAGNOSIS — R4789 Other speech disturbances: Secondary | ICD-10-CM | POA: Diagnosis not present

## 2017-05-24 DIAGNOSIS — J45909 Unspecified asthma, uncomplicated: Secondary | ICD-10-CM | POA: Diagnosis not present

## 2017-05-24 DIAGNOSIS — I8311 Varicose veins of right lower extremity with inflammation: Secondary | ICD-10-CM | POA: Diagnosis not present

## 2017-05-24 DIAGNOSIS — Z0189 Encounter for other specified special examinations: Secondary | ICD-10-CM

## 2017-05-24 DIAGNOSIS — I152 Hypertension secondary to endocrine disorders: Secondary | ICD-10-CM

## 2017-05-24 DIAGNOSIS — I8312 Varicose veins of left lower extremity with inflammation: Secondary | ICD-10-CM

## 2017-05-24 DIAGNOSIS — I872 Venous insufficiency (chronic) (peripheral): Secondary | ICD-10-CM

## 2017-05-24 DIAGNOSIS — R011 Cardiac murmur, unspecified: Secondary | ICD-10-CM

## 2017-05-24 DIAGNOSIS — Z79899 Other long term (current) drug therapy: Secondary | ICD-10-CM | POA: Diagnosis not present

## 2017-05-24 DIAGNOSIS — K760 Fatty (change of) liver, not elsewhere classified: Secondary | ICD-10-CM

## 2017-05-24 DIAGNOSIS — I890 Lymphedema, not elsewhere classified: Secondary | ICD-10-CM

## 2017-05-24 DIAGNOSIS — Z0000 Encounter for general adult medical examination without abnormal findings: Secondary | ICD-10-CM

## 2017-05-24 DIAGNOSIS — I10 Essential (primary) hypertension: Principal | ICD-10-CM

## 2017-05-24 DIAGNOSIS — Z8249 Family history of ischemic heart disease and other diseases of the circulatory system: Secondary | ICD-10-CM | POA: Diagnosis not present

## 2017-05-24 DIAGNOSIS — Z818 Family history of other mental and behavioral disorders: Secondary | ICD-10-CM | POA: Diagnosis not present

## 2017-05-24 DIAGNOSIS — Z809 Family history of malignant neoplasm, unspecified: Secondary | ICD-10-CM

## 2017-05-24 DIAGNOSIS — E119 Type 2 diabetes mellitus without complications: Secondary | ICD-10-CM

## 2017-05-24 LAB — GLUCOSE, CAPILLARY: Glucose-Capillary: 158 mg/dL — ABNORMAL HIGH (ref 65–99)

## 2017-05-24 LAB — POCT GLYCOSYLATED HEMOGLOBIN (HGB A1C): Hemoglobin A1C: 7.4

## 2017-05-24 MED ORDER — METFORMIN HCL ER 500 MG PO TB24: ORAL_TABLET | ORAL | 2 refills | Status: DC

## 2017-05-24 MED ORDER — AMLODIPINE BESYLATE 10 MG PO TABS: 10.0000 mg | ORAL_TABLET | Freq: Every day | ORAL | 1 refills | Status: DC

## 2017-05-24 MED ORDER — LISINOPRIL 10 MG PO TABS: 10.0000 mg | ORAL_TABLET | Freq: Every day | ORAL | 0 refills | Status: DC

## 2017-05-24 NOTE — Patient Instructions (Addendum)
It was a pleasure to see you Ms. Acri.  For your blood pressure, please continue Amlodipine 10 mg daily. We will add another medication called Lisinopril 10 mg once a day.  Please change the way you are taking Clonidine as follows: - Take Clonidine 0.2 mg twice a day for 3 days then, - Take Clonidine 0.2 mg once a day for 3 days then, - Take Clonidine 0.2 mg every other day for three days then stop.  For your diabetes, please continue the Metformin 1000 mg in the morning and 500 mg in the evening. Continue your Glipizide ER 20 mg total daily.  Your Hemoglobin A1c is 7.4 today. I do not think you need to continue checking your blood sugars frequently at home.  Your leg swelling is likely from chronic venous insufficiency. Try elevating your legs above the level of your heart when sleeping at night or when resting at home. Try compression stockings to help with the swelling as well.  Please use your Advair daily for maintenance of your asthma. Use your albuterol inhaler as needed for shortness of breath or wheezing.  Please follow up with [place] in about 4-6 weeks or sooner if needed.

## 2017-05-24 NOTE — Therapy (Addendum)
[place], [place], [address] Phone: [phone]   Fax:  [phone]  Physical Therapy Treatment/ Discharge   Patient Details  Name: Mariah [name] MRN: [id number] Date of Birth: 10/17/1950 Referring Provider: Juluis Mire NP   Encounter Date: 05/23/2017      PT End of Session - 05/24/17 0801    Visit Number 3   Number of Visits 4   Date for PT Re-Evaluation 05/18/17   Authorization Type medicaid will recertify for more visits if medically nessecary    PT Start Time 1415   PT Stop Time 1458   PT Time Calculation (min) 43 min   Activity Tolerance Patient tolerated treatment well   Behavior During Therapy [facility] for tasks assessed/performed      Past Medical History:  Diagnosis Date  . Allergic rhinitis   . Arthritis    right anke/foot  . Asthma   . Bursitis   . Carpal tunnel syndrome   . Diabetes mellitus without complication ([place])   . GERD (gastroesophageal reflux disease)   . HTN (hypertension)   . Knee pain   . Obesity   . Sciatica   . Tendinitis   . Thumb pain    right    Past Surgical History:  Procedure Laterality Date  . arthroscopic left knee surgery  2003  . repeat fibroid excision  1997   Dr [name]  . right heel surgery on plantar facitis  1995  . UTERINE FIBROID SURGERY  1988   Dr [name]  . WRIST SURGERY      There were no vitals filed for this visit.      Subjective Assessment - 05/23/17 1439    Subjective Patient continues to report sitffness in her neck. She is still having difficulty walking. she has increased swelling in her legs and feet today. She feels like she can not bend her legs.    Limitations Standing;Walking;Sitting;Lifting;House hold activities   How long can you sit comfortably? < 25 min    How long can you stand comfortably? Diffiuclty maintaining a standing position for more then a few minutes.    How long can you walk comfortably? Difficulty walking  fromlobby to first treatment room roughly 50 feet.    Diagnostic tests Nothing in the chart pertaining to neck and back    Patient Stated Goals to walk more    Pain Score 8    Pain Location Neck   Pain Orientation Left   Pain Type Acute pain   Pain Onset 1 to 4 weeks ago   Pain Frequency Constant   Aggravating Factors  cervical rotation    Pain Relieving Factors warm wash cloth    Effect of Pain on Daily Activities difficulty perfroming daily tasks    Multiple Pain Sites Yes   Pain Score 6   Pain Location Back   Pain Orientation Lower   Pain Descriptors / Indicators Sharp   Pain Type Chronic pain   Pain Onset More than a month ago   Pain Frequency Constant   Aggravating Factors  getting up and down                          [facility] Adult PT Treatment/Exercise - 05/24/17 0001      Transfers   Comments sit to stand 2x with cuing for,m table. Required min a 1x andmod a 1x      Lumbar Exercises: Seated  Other Seated Lumbar Exercises supine heel slides with strap  unable bilateral 2nd to significant swelling in her legs.      Knee/Hip Exercises: Seated   Ball Squeeze 2x10 with cuing for posture    Clamshell with TheraBand Yellow  2x10      Manual Therapy   Manual Therapy Soft tissue mobilization;Manual Traction   Soft tissue mobilization STM to upper traps and cervical paraspianals    Manual Traction gentle manual cervical traction                 PT Education - 05/24/17 0800    Education provided Yes   Education Details review HEP    Person(s) Educated Patient   Methods Explanation;Demonstration   Comprehension Verbalized understanding;Returned demonstration;Verbal cues required;Tactile cues required          PT Short Term Goals - 04/20/17 1324      PT SHORT TERM GOAL #1   Title Patient will increase bilateral UE strength to 4+/5    Baseline 4/5 flexion IR / ER  3+/5 abduction bilateral    Target Date 05/18/17     PT SHORT TERM GOAL #2    Title Patient will be independent with basic exercises to improve movement    Baseline can perfrom only limited movements    Time 4   Period Weeks   Status New   Target Date 05/18/17     PT SHORT TERM GOAL #3   Title Patient will transfer sit to stand with least restrictive assistive device with SBA    Baseline Min to mod a to stand for chair safely    Time 4   Period Weeks   Status New   Target Date 05/18/17     PT SHORT TERM GOAL #4   Title Patient will improve bilateral hip flexion to 50 degrees without pain    Baseline 28 degrees on right 20 degrees on left with pain    Time 4   Period Weeks   Status New           PT Long Term Goals - 04/20/17 1501      PT LONG TERM GOAL #1   Title Patient will stand for 10 minutes without increased pain in order to peform household chores    Baseline < 5 minutes of standing    Time 8   Period Weeks   Status New   Target Date 06/15/17     PT LONG TERM GOAL #2   Title Patient will walk 600' with LRAD with < 3/10 pain in her neck back and legs    Baseline difficulty ambualting more then 50' with cane and crutch    Time 8   Period Weeks   Status New   Target Date 06/15/17     PT LONG TERM GOAL #3   Title Patient will demostrate a 41% limitation on FOTO    Baseline 61% limitation on FOTO    Time 8   Period Weeks   Status New               Plan - 05/24/17 0802    Clinical Impression Statement Ayt this point the patients other medical issues are limiting what she can do. She could not move her legs today. She could not bend them and needed mod assistance to get her egs on the table. She perfromed all exercises asked of her that she could. Therapy focused on manual therapy for the neck and postural correction   exercises. She required min to mod a to stand form table and chair.    Clinical Presentation Unstable   Clinical Decision Making High   Rehab Potential Poor   Clinical Impairments Affecting Rehab Potential  difficulty maintaining alert state; limited leg movement with significant swelling; difficulty transfering into all positions;    PT Frequency 2x / week   PT Duration 8 weeks   PT Next Visit Plan review use of rolling walker; review HEP; manual therapy to her neck   PT Home Exercise Plan setaed forward stretch with her cane and side to side; cervical rotation in pain free range 2-3x; bilateral heel slide with strap; yellow band horizontal abduction and ER    Consulted and Agree with Plan of Care Patient      Patient will benefit from skilled therapeutic intervention in order to improve the following deficits and impairments:  Abnormal gait, Decreased activity tolerance, Pain, Increased edema, Decreased strength, Decreased mobility, Decreased knowledge of use of DME, Decreased balance, Increased muscle spasms, Decreased endurance, Difficulty walking, Impaired tone, Decreased range of motion  Visit Diagnosis: Acute bilateral low back pain without sciatica  Cervicalgia  Abnormal posture  Difficulty in walking, not elsewhere classified     Problem List Patient Active Problem List   Diagnosis Date Noted  . Sciatica   . Asthma with acute exacerbation 04/10/2014  . Sinusitis, acute 06/03/2013  . Morbid obesity ([place]) 10/21/2012  . Left buttock abscess s/p I&D 10/21/2012 10/20/2012  . Uncontrolled type 2 diabetes mellitus with complication ([place]) 97/28/2060  . CELLULITIS AND ABSCESS OF LEG EXCEPT FOOT 10/01/2010  . HYPERGLYCEMIA 06/14/2010  . BACKGROUND DIABETIC RETINOPATHY 05/05/2010  . ARTHRITIS,RIGHT ANKLE/FOOT 09/03/2009  . MEDIAL EPICONDYLITIS, LEFT 01/06/2009  . FATIGUE 01/06/2009  . FIBROIDS, UTERUS 07/10/2008  . PERIPHERAL EDEMA 07/10/2008  . OTHER DENTAL CARIES 03/04/2008  . ACUTE BRONCHITIS 09/17/2007  . THUMB PAIN, RIGHT 06/12/2007  . HYPERTENSION 04/26/2007  . Allergic rhinitis 04/26/2007  . Asthma 04/26/2007  . GASTROESOPHAGEAL REFLUX DISEASE 04/26/2007  . KNEE PAIN  04/26/2007  . TENDINITIS 04/26/2007    PHYSICAL THERAPY DISCHARGE SUMMARY  Visits from Start of Care: 3  Current functional level related to goals / functional outcomes: Unknown. Patient did not return for last visit.    Remaining deficits: Little function of bilateral lower extremities    Education / Equipment: HEP  Plan: Patient agrees to discharge.  Patient goals were not met. Patient is being discharged due to meeting the stated rehab goals.  ?????      Carney Living PT DPT  05/24/2017, 9:39 AM  [facility] [address] [place], [place], [address] Phone: [phone]   Fax:  [phone]  Name: MARCEY PERSAD MRN: [id number] Date of Birth: March 18, 1951

## 2017-05-24 NOTE — Progress Notes (Addendum)
CC: diabetes  HPI:  [name] is a 66 y.o. female with PMH of T2DM, HTN, Asthma, and Chronic Venous Stasis with Lymphedema who presents to establish care and for management of the above.  T2DM: She reports taking Metformin XR 1000 mg am and 500 mg pm (did not tolerate 1000 mg at night due to nausea) and Glipizide ER 20 mg daily. She did bring her medications and had a bottle of Glipizide ER and Glipizide XL although she says she is only taking one of these. Her last A1c in our system was 9.0 in 2014.  HTN: She is currently taking Amlodipine 10 mg daily (she brought 3 different medication bottles of Amlodipine) and is taking Clonidine 0.2 mg three times daily. She does say she feels sick when she misses a dose of clonidine. Her BP this visit is 155/69.  Asthma: She follows with Pulmonology for her asthma. She is prescribed Advair 100-50 mcg 1 puff BID (she is not taking daily) and Albuterol inhaler 1-2 puffs q6h prn (using once a day for last week).   Chronic Venous Stasis Dermatitis of Both Legs: She has had leg swelling for about 7 years now without a history of DVTs. She was seen by vascular surgery in April 2017 to assess for arterial insufficiency. Lower extremity dopplers with ABI were obtained which were interpreted as follows:  - On the right side, there is a biphasic posterior tibial signal and a triphasic dorsalis pedis signal. ABI is 100%. Toe pressure on the right is 108 mmHg.  - On the left side, there is a biphasic posterior tibial signal and a triphasic dorsalis pedis signal. ABI is 100%. Toe pressure on the left is 101 mmHg.   Conservative measures with leg elevation and compression stockings were recommended. She has tried the stockings but has occasional trouble placing them. She has been taking Lasix for her swelling without significant change.  NAFLD: CTA Chest/Abd/Pelvis on 02/06/16 showed diffuse fatty infiltration of the liver. She denies alcohol  use.  Healthcare Maintenance: She reports receiving her vaccinations including flu shot this year by her previous PCP Mariah [name] with [facility] at [facility].   Social History   Social History  . Marital status: Single    Spouse name: N/A  . Number of children: 0  . Years of education: N/A   Occupational History  . 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  Unemployed   Social History Main Topics  . Smoking status: Never Smoker  . Smokeless tobacco: Never Used  . Alcohol use No  . Drug use: No  . Sexual activity: Yes    Birth control/ protection: None   Other Topics Concern  . 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 Uterine cancer Mother   . Hypertension Mother   . Heart disease Mother   . Hypertension Father   . Alzheimer's disease Father   . Cancer Other      Past Medical History:  Diagnosis Date  . Allergic rhinitis   . Arthritis    right anke/foot  . Asthma   . Bursitis   . Carpal tunnel syndrome   . Diabetes mellitus without complication ([place])   . GERD (gastroesophageal reflux disease)   .  HTN (hypertension)   . Knee pain   . Obesity   . Sciatica   . Tendinitis   . Thumb pain    right   Review of Systems:   Review of Systems  Respiratory: Negative for cough, shortness of breath and wheezing.   Cardiovascular: Positive for leg swelling. Negative for chest pain and palpitations.  Genitourinary: Negative for dysuria.  Musculoskeletal: Positive for joint pain. Negative for falls.  Neurological: Positive for tingling. Negative for loss of consciousness.     Physical Exam:  Vitals:   05/24/17 1417  BP: (!) 155/69  Pulse: 93  Temp: 98.2 F (36.8 C)  TempSrc: Oral  SpO2: 99%  Weight: 233 lb 12.8 oz (106.1 kg)  Height: 5\' 1"  (1.549 m)    Physical Exam  Constitutional: She is oriented to person, place, and time. She appears well-nourished. No distress.  Ambulates with 2 canes  HENT:  Head: Normocephalic and atraumatic.  Cardiovascular: Normal rate and regular rhythm.   Systolic murmur loudest RUS border  Pulmonary/Chest: Effort normal. No respiratory distress. She has no wheezes.  Coarse expiratory breath sounds bilaterally  Musculoskeletal:  Large edematous lower extremities with stasis dermatitis changes  Neurological: She is alert and oriented to person, place, and time.  Skin: Skin is warm. She is not diaphoretic.  Psychiatric: Her speech is tangential.    Assessment & Plan:   See Encounters Tab for problem based charting.  Patient discussed with Dr. Lynnae January  Type 2 diabetes mellitus treated without insulin ([place]) Repeat A1c this visit is 7.4, improved from prior available values. Will recommend that she continue her current medications for now and work on dietary changes. I do not think she needs to frequently check her CBGs at home, unless she feels symptoms of hypo/hyperglycemia. I did discuss this with her. - Continue Glipizide ER 20 mg daily - Continue Metformin XR 1000 mg am and 500 mg pm (reports nausea with 1000 mg BID) - Repeat A1c in 3 months  Hypertension associated with diabetes ([place]) BP Readings from Last 3 Encounters:  05/24/17 (!) 155/69  03/26/17 (!) 158/79  07/17/16 163/78   Her blood pressure is elevated. She reports adherence to amlodipine and clonidine. I would like to titrate her off clonidine as I am not convinced she is taking all of her medications daily as prescribed and is at risk for rebound hypertension. Will add on Lisinopril this visit. - Start Lisinopril 10 mg daily - Continue Amlodipine 10 mg daily - Titrate Clonidine off; start at 0.2 mg BID x 3 days then daily x 3 days until off - f/u in 3-4 weeks for BP check and BMET  Asthma Currently stable, however she is not using  her maintenance inhaler as prescribed. She is recommended to [place] her Advair as prescribed twice daily and albuterol as needed for shortness of breath or wheezing.  Chronic venous stasis dermatitis of both lower extremities She has chronic venous insufficiency of both legs with stasis dermatitis changes and lymphedema. Management is generally conservative which I have recommended. I do not think lasix will provide benefit here. - Elevate legs above level of chest when sleeping and at rest during the day - Compression stockings; may need to be refitted for new stockings.  NAFLD (nonalcoholic fatty liver disease) Seen on CTA Chest/Abd/Pelvis 02/06/16. She denies alcohol use.  Healthcare maintenance Will try to obtain records from prior PCP to update her health maintenance screenings and immunizations.

## 2017-05-25 ENCOUNTER — Encounter: Payer: Self-pay | Admitting: Internal Medicine

## 2017-05-25 DIAGNOSIS — Z1239 Encounter for other screening for malignant neoplasm of breast: Secondary | ICD-10-CM | POA: Insufficient documentation

## 2017-05-25 MED ORDER — GLIPIZIDE ER 10 MG PO TB24: 20.0000 mg | ORAL_TABLET | Freq: Every day | ORAL | 1 refills | Status: DC

## 2017-05-25 NOTE — Assessment & Plan Note (Signed)
Will try to obtain records from prior PCP to update her health maintenance screenings and immunizations.

## 2017-05-25 NOTE — Assessment & Plan Note (Signed)
BP Readings from Last 3 Encounters:  05/24/17 (!) 155/69  03/26/17 (!) 158/79  07/17/16 163/78   Her blood pressure is elevated. She reports adherence to amlodipine and clonidine. I would like to titrate her off clonidine as I am not convinced she is taking all of her medications daily as prescribed and is at risk for rebound hypertension. Will add on Lisinopril this visit. - Start Lisinopril 10 mg daily - Continue Amlodipine 10 mg daily - Titrate Clonidine off; start at 0.2 mg BID x 3 days then daily x 3 days until off - f/u in 3-4 weeks for BP check and BMET

## 2017-05-25 NOTE — Addendum Note (Signed)
Addended by: Zada Finders R on: 05/25/2017 10:22 AM   Modules accepted: Level of Service

## 2017-05-25 NOTE — Assessment & Plan Note (Signed)
Repeat A1c this visit is 7.4, improved from prior available values. Will recommend that she continue her current medications for now and work on dietary changes. I do not think she needs to frequently check her CBGs at home, unless she feels symptoms of hypo/hyperglycemia. I did discuss this with her. - Continue Glipizide ER 20 mg daily - Continue Metformin XR 1000 mg am and 500 mg pm (reports nausea with 1000 mg BID) - Repeat A1c in 3 months

## 2017-05-25 NOTE — Assessment & Plan Note (Signed)
Currently stable, however she is not using her maintenance inhaler as prescribed. She is recommended to [place] her Advair as prescribed twice daily and albuterol as needed for shortness of breath or wheezing.

## 2017-05-25 NOTE — Assessment & Plan Note (Signed)
Seen on CTA Chest/Abd/Pelvis 02/06/16. She denies alcohol use.

## 2017-05-25 NOTE — Assessment & Plan Note (Signed)
She has chronic venous insufficiency of both legs with stasis dermatitis changes and lymphedema. Management is generally conservative which I have recommended. I do not think lasix will provide benefit here. - Elevate legs above level of chest when sleeping and at rest during the day - Compression stockings; may need to be refitted for new stockings.

## 2017-05-29 NOTE — Progress Notes (Signed)
Internal Medicine Clinic Attending  Case discussed with Dr. Patel at the time of the visit.  We reviewed the resident's history and exam and pertinent patient test results.  I agree with the assessment, diagnosis, and plan of care documented in the resident's note.  

## 2017-05-30 ENCOUNTER — Ambulatory Visit: Payer: No Typology Code available for payment source | Admitting: Physical Therapy

## 2017-06-08 ENCOUNTER — Ambulatory Visit: Payer: Medicaid Other

## 2017-06-13 ENCOUNTER — Encounter: Payer: Self-pay | Admitting: Internal Medicine

## 2017-06-13 ENCOUNTER — Other Ambulatory Visit: Payer: Self-pay

## 2017-06-13 ENCOUNTER — Ambulatory Visit (INDEPENDENT_AMBULATORY_CARE_PROVIDER_SITE_OTHER): Payer: Medicaid Other | Admitting: Internal Medicine

## 2017-06-13 VITALS — BP 158/80 | HR 84 | Temp 97.9°F | Ht 61.0 in

## 2017-06-13 DIAGNOSIS — Z79899 Other long term (current) drug therapy: Secondary | ICD-10-CM | POA: Diagnosis not present

## 2017-06-13 DIAGNOSIS — I152 Hypertension secondary to endocrine disorders: Secondary | ICD-10-CM

## 2017-06-13 DIAGNOSIS — E1159 Type 2 diabetes mellitus with other circulatory complications: Secondary | ICD-10-CM | POA: Diagnosis not present

## 2017-06-13 DIAGNOSIS — R011 Cardiac murmur, unspecified: Secondary | ICD-10-CM | POA: Diagnosis not present

## 2017-06-13 DIAGNOSIS — I872 Venous insufficiency (chronic) (peripheral): Secondary | ICD-10-CM | POA: Diagnosis not present

## 2017-06-13 DIAGNOSIS — I10 Essential (primary) hypertension: Principal | ICD-10-CM

## 2017-06-13 MED ORDER — LISINOPRIL 10 MG PO TABS: 10.0000 mg | ORAL_TABLET | Freq: Every day | ORAL | 0 refills | Status: DC

## 2017-06-13 NOTE — Patient Instructions (Signed)
FOLLOW-UP INSTRUCTIONS [name] I want to continue working on stopping the Clonidine.   Please take 1/2 a pill twice a day for the next 3 days. Then take 1/2 a pill daily for the next 3 days and then stop taking it. I am going to start you on a new medication called Lisinopril 10 mg daily. If you have any questions or concerns please call the clinic for clarification.  I will also get you referred to wound care to work on getting wrappings for your legs to help with the swelling.  When: Next week (Thursday or Friday) For: BP recheck What to bring: Your medications

## 2017-06-13 NOTE — Progress Notes (Signed)
   CC: HTN follow up  HPI:  [name] is a 66 y.o. female with a past medical history listed below here today for follow up of her HTN.  For details of today's visit and the status of her chronic medical issues please refer to the assessment and plan.   Past Medical History:  Diagnosis Date  . Allergic rhinitis   . Arthritis    right anke/foot  . Asthma   . Bursitis   . Carpal tunnel syndrome   . Diabetes mellitus without complication ([place])   . GERD (gastroesophageal reflux disease)   . HTN (hypertension)   . Knee pain   . Obesity   . Sciatica   . Tendinitis   . Thumb pain    right   Review of Systems:   No chest pain or shortness of breath  Physical Exam:  Vitals:   06/13/17 1552  BP: (!) 158/80  Pulse: 84  Temp: 97.9 F (36.6 C)  SpO2: 100%  Height: 5\' 1"  (1.549 m)   GENERAL- alert, co-operative, chronically ill appearing female sitting in wheelchair CARDIAC- RRR, systolic murmur at RUS RESP- Moving equal volumes of air, no wheezes or crackles, coarse rhonchi throughout all lung fields ABDOMEN- Soft, nontender, bowel sounds present. NEURO- No obvious Cr N abnormality. EXTREMITIES- Large edematous bilateral lower extremities with chronic venous stasis dermatitis. Several small weeping lesions over lateral aspect of lower leg.  SKIN- Warm, dry. No erythema or warmth. PSYCH- Tangential speech, easily distracted    Assessment & Plan:   See Encounters Tab for problem based charting.  Patient discussed with Dr. Eppie Gibson

## 2017-06-14 NOTE — Assessment & Plan Note (Signed)
She has not gotten any compression stockings. She reports that her left leg has started weeping clear fluid. No signs of infection on exam today. ABIs from 10/2015 were normal.   Will refer to wound care for [facility] boot.

## 2017-06-14 NOTE — Assessment & Plan Note (Signed)
BP Readings from Last 3 Encounters:  06/13/17 (!) 158/80  05/24/17 (!) 155/69  03/26/17 (!) 158/79    Lab Results  Component Value Date   NA 137 02/06/2016   K 3.4 (L) 02/06/2016   CREATININE 0.91 02/06/2016   BP unchanged from previous visit. She reports she is currently taking the amlodipine 10 mg daily and is now taking clonidine 0.1 mg tid. She reports that she never received any prescription for lisinopril at her pharmacy. Says that she knew Dr. Posey Pronto wanted to decrease the dose of her clonidine but could not remember what he told her to do and could find the paperwork with it written down. She says she has been taking 1/2 pill three times a day. Denies any headaches, sob, chest pain today.  A/P: Will continue the clonidine taper started by Dr. Posey Pronto. Will take Clonidine 0.1 mg bid x 3 days, then daily x 3 days and then stop. Discussed with patient and had her repeat the plan back to me several times before she could repeat the plan correctly. Gave her a print out of the instructions as well.  Will start lisinopril 10 mg daily, corrected her pharmacy in our computer system. Will have her return in 1 week for re-check and lab work. Suspect she will need further up-titration of her lisinopril when she comes off the clonidine.  Given her significant LE edema with chronic venous stasis dermatitis, may be beneficial to attempt to come off amlodipine in the future when her BP is better controlled as it could be contributing to her LE edema.

## 2017-06-15 NOTE — Addendum Note (Signed)
Addended by: Oval Linsey D on: 06/15/2017 02:36 PM   Modules accepted: Level of Service

## 2017-06-15 NOTE — Progress Notes (Signed)
Patient ID: Mariah [name], female   DOB: 03-16-1951, 66 y.o.   MRN: [id number]  Case discussed with Dr. Charlynn Grimes at the time of the visit. We reviewed the resident's history and exam and pertinent patient test results. I agree with the assessment, diagnosis, and plan of care documented in the resident's note.

## 2017-06-26 ENCOUNTER — Ambulatory Visit: Payer: Medicaid Other

## 2017-06-26 ENCOUNTER — Encounter: Payer: Self-pay | Admitting: Internal Medicine

## 2017-07-12 ENCOUNTER — Other Ambulatory Visit: Payer: Self-pay | Admitting: Internal Medicine

## 2017-07-12 DIAGNOSIS — E1159 Type 2 diabetes mellitus with other circulatory complications: Secondary | ICD-10-CM

## 2017-07-12 DIAGNOSIS — I10 Essential (primary) hypertension: Principal | ICD-10-CM

## 2017-07-14 ENCOUNTER — Other Ambulatory Visit: Payer: Self-pay

## 2017-07-14 ENCOUNTER — Ambulatory Visit: Payer: Medicaid Other | Admitting: Internal Medicine

## 2017-07-14 ENCOUNTER — Encounter: Payer: Self-pay | Admitting: Internal Medicine

## 2017-07-14 VITALS — BP 167/82 | HR 97 | Temp 98.4°F | Ht 61.0 in | Wt 233.6 lb

## 2017-07-14 DIAGNOSIS — I10 Essential (primary) hypertension: Principal | ICD-10-CM

## 2017-07-14 DIAGNOSIS — I872 Venous insufficiency (chronic) (peripheral): Secondary | ICD-10-CM | POA: Diagnosis not present

## 2017-07-14 DIAGNOSIS — R011 Cardiac murmur, unspecified: Secondary | ICD-10-CM

## 2017-07-14 DIAGNOSIS — Z9114 Patient's other noncompliance with medication regimen: Secondary | ICD-10-CM

## 2017-07-14 DIAGNOSIS — E1159 Type 2 diabetes mellitus with other circulatory complications: Secondary | ICD-10-CM

## 2017-07-14 DIAGNOSIS — Z79899 Other long term (current) drug therapy: Secondary | ICD-10-CM

## 2017-07-14 DIAGNOSIS — I152 Hypertension secondary to endocrine disorders: Secondary | ICD-10-CM | POA: Diagnosis not present

## 2017-07-14 DIAGNOSIS — G8929 Other chronic pain: Secondary | ICD-10-CM | POA: Diagnosis not present

## 2017-07-14 NOTE — Progress Notes (Signed)
   CC: follow-up of HTN  HPI:  Mariah [name] is a 66 y.o. female with essential hypertension here for follow-up. She was seen one month ago for blood pressure check after was recommended that she taper off clonidine and start lisinopril. She was unaware that lisinopril was at her pharmacy and subsequently did not start. At her visit 1 month ago, she was instructed to completely taper off clonidine and start lisinopril. Was to return to clinic in 1 week for BP check in labs however missed this appointment.  The patient tells me that she was able to stop taking clonidine however she lost her bottle of lisinopril shortly after getting it filled and has not taken it in over a week.  Past Medical History:  Diagnosis Date  . Allergic rhinitis   . Arthritis    right anke/foot  . Asthma   . Bursitis   . Carpal tunnel syndrome   . Diabetes mellitus without complication ([place])   . GERD (gastroesophageal reflux disease)   . HTN (hypertension)   . Knee pain   . Obesity   . Sciatica   . Tendinitis   . Thumb pain    right   Review of Systems:   General: Denies fevers, chills HEENT: Denies changes in vision Cardiac: Denies CP, SOB Pulmonary: Denies cough Abd: Denies diarrhea, changes in bowels  Physical Exam: General: Alert, in no acute distress. Pleasant and very conversant HEENT: No icterus or ptosis. No hoarseness or dysarthria  Cardiac: RRR, +systolic murmur RUSB Pulmonary: CTA BL with normal WOB on RA. Able to speak in complete sentences Extremities: Chronic venous stasis dermatitis BL. Small lesions with weeping, no foul odor or erythema.  Psych: Tangential speech. Tries to change subject to her chronic pain.   Vitals:   07/14/17 1433  BP: (!) 167/82  Pulse: 97  Temp: 98.4 F (36.9 C)  TempSrc: Oral  SpO2: 98%  Weight: 233 lb 9.6 oz (106 kg)  Height: 5\' 1"  (1.549 m)   Assessment & Plan:   See Encounters Tab for problem based charting.  Patient discussed with Dr.  Evette Doffing

## 2017-07-14 NOTE — Assessment & Plan Note (Addendum)
She is again noncompliant with her treatment regimen and is hypertensive today in the 160s. She tells me that she lost her lisinopril shortly after getting it filled after her last visit. She thinks it might have rolled under her couch but hasn't looked. She is reportedly stopped taking clonidine and tells me she takes amlodipine daily however reveals bottles in the room several months old. We talked at length today the importance of controlling her blood pressure and regularly taking her medications. Obstacles she identifies been taking her medications is her chronic pain. She does not have a pillbox at home and carries her medications in a bag which she often misplaces. Overall think this patients blood pressure will be difficult to control due to noncompliance. She will need frequent education and direction. -Patient to pick up and start lisinopril 10 mg -We'll continue amlodipine 10 mg -Asked patient to return to clinic in 2-4 weeks second check her blood pressure and labs while she is on the medicine -Provided patient with new pillbox from clinic. Encourage its use and placing it in an easily accessible location that she frequents often. -Provided patient with list of her medications to help her fill this. -Encouraged patient to use a alarm on her phone to help her remember to take her medicines

## 2017-07-14 NOTE — Patient Instructions (Addendum)
FOLLOW-UP INSTRUCTIONS When: 2 to 4 weeks For: To check your blood pressure and labs while you are on the medication for at least a week What to bring: Your medications and pillbox.    It was nice meeting you today!   Your blood pressure was elevated today. This is likely because you are taking your medicines inconsistently. I checked to make sure there is a prescription of Lisinopril for you at your pharmacy. Please pick this up and start taking it today.   I have given you a pillbox to help make your medications easier to manage. Please fill this once a week according to your instructions on the bottle of your medicines. Put it in a place where you will see it. You could try setting an alarm on your phone.   We need to see you back in 2-4 weeks to see you while you have been on the medication for awhile.   Happy Holidays and I hope you have a Happy New Year!

## 2017-07-15 NOTE — Progress Notes (Signed)
Internal Medicine Clinic Attending  Case discussed with Dr. Molt at the time of the visit.  We reviewed the resident's history and exam and pertinent patient test results.  I agree with the assessment, diagnosis, and plan of care documented in the resident's note. 

## 2017-07-21 ENCOUNTER — Other Ambulatory Visit: Payer: Self-pay | Admitting: *Deleted

## 2017-07-21 DIAGNOSIS — I152 Hypertension secondary to endocrine disorders: Secondary | ICD-10-CM

## 2017-07-21 DIAGNOSIS — I10 Essential (primary) hypertension: Principal | ICD-10-CM

## 2017-07-21 DIAGNOSIS — E1159 Type 2 diabetes mellitus with other circulatory complications: Secondary | ICD-10-CM

## 2017-07-27 MED ORDER — AMLODIPINE BESYLATE 10 MG PO TABS: 10.0000 mg | ORAL_TABLET | Freq: Every day | ORAL | 5 refills | Status: DC

## 2017-07-28 ENCOUNTER — Encounter: Payer: Self-pay | Admitting: Internal Medicine

## 2017-07-28 ENCOUNTER — Ambulatory Visit (INDEPENDENT_AMBULATORY_CARE_PROVIDER_SITE_OTHER): Payer: Medicaid Other | Admitting: Internal Medicine

## 2017-07-28 ENCOUNTER — Encounter (INDEPENDENT_AMBULATORY_CARE_PROVIDER_SITE_OTHER): Payer: Self-pay

## 2017-07-28 VITALS — BP 155/72 | HR 89 | Temp 97.9°F

## 2017-07-28 DIAGNOSIS — Z79891 Long term (current) use of opiate analgesic: Secondary | ICD-10-CM

## 2017-07-28 DIAGNOSIS — I10 Essential (primary) hypertension: Principal | ICD-10-CM

## 2017-07-28 DIAGNOSIS — Z79899 Other long term (current) drug therapy: Secondary | ICD-10-CM

## 2017-07-28 DIAGNOSIS — R2243 Localized swelling, mass and lump, lower limb, bilateral: Secondary | ICD-10-CM | POA: Diagnosis not present

## 2017-07-28 DIAGNOSIS — I152 Hypertension secondary to endocrine disorders: Secondary | ICD-10-CM

## 2017-07-28 DIAGNOSIS — R011 Cardiac murmur, unspecified: Secondary | ICD-10-CM

## 2017-07-28 DIAGNOSIS — I872 Venous insufficiency (chronic) (peripheral): Secondary | ICD-10-CM | POA: Diagnosis not present

## 2017-07-28 DIAGNOSIS — E1159 Type 2 diabetes mellitus with other circulatory complications: Secondary | ICD-10-CM

## 2017-07-28 NOTE — Progress Notes (Signed)
   CC: hypertension  HPI:  Mariah [name] is a 67 y.o. with a PMH of HTN, T2DM, chronic venous insufficiency, and morbid obesity presenting to clinic for f/u on hypertension.  HTN: At last visit, patient was instructed to start taking lisinopril 10mg  daily along with continuing her amlodipine 10mg  daily. Patient states compliance with this, however this morning did not have time to take the lisinopril yet. She denies chest pain, shortness of breath, headaches, vision or hearing changes.   She continues to endorse bil leg pain due to her chronic venous insufficiency; she takes tramadol for this which is prescribed by wound care. She is scheduled to see a new wound care center on 1/11 and is hoping to get una boots to help with her swelling and discomfort.   Please see problem based Assessment and Plan for status of patients chronic conditions.  Past Medical History:  Diagnosis Date  . Allergic rhinitis   . Arthritis    right anke/foot  . Asthma   . Bursitis   . Carpal tunnel syndrome   . Diabetes mellitus without complication ([place])   . GERD (gastroesophageal reflux disease)   . HTN (hypertension)   . Knee pain   . Obesity   . Sciatica   . Tendinitis   . Thumb pain    right    Review of Systems:   ROS Per HPI  Physical Exam:  Vitals:   07/28/17 1601 07/28/17 1626  BP: (!) 162/89 (!) 155/72  Pulse: 93 89  Temp: 97.9 F (36.6 C)   TempSrc: Oral   SpO2: 100%    GENERAL- alert, co-operative, appears as stated age, not in any distress. HEENT- oral mucosa appears moist CARDIAC- RRR, systolic ejection murmur, no rubs or gallops. RESP- Moving equal volumes of air, and clear to auscultation bilaterally, no wheezes or crackles. ABDOMEN- Soft, nontender, bowel sounds present. NEURO- No obvious Cr N abnormality. EXTREMITIES- 2-3+ pitting edema in bil LEs  SKIN- Warm, dry, no rash. Chronic nodules on bil LEs. PSYCH- Normal mood and affect, appropriate thought content and  speech.  Assessment & Plan:   See Encounters Tab for problem based charting.   Patient discussed with Dr. Gerrit Friends, MD Internal Medicine PGY2

## 2017-07-28 NOTE — Assessment & Plan Note (Signed)
At last visit, patient was instructed to start taking lisinopril 10mg  daily along with continuing her amlodipine 10mg  daily. Patient states compliance with this, however this morning did not have time to take the lisinopril yet. She denies chest pain, shortness of breath, headaches, vision or hearing changes.   BP still elevated on repeat, however patient has not had lisinopril yet today.  Plan: --continue amlodipine 10mg  daily and lisinopril 10mg  daily  --f/u Bmet to evaluate renal function since starting ACE-I --f/u in 1 month with PCP for BP check --advised patient to fill out her pill box to make med administration easier for scheduled meds; she is to bring her pillbox and pill bottles to her next appt

## 2017-07-28 NOTE — Assessment & Plan Note (Signed)
She continues to endorse bil leg pain due to her chronic venous insufficiency; she takes tramadol for this which is prescribed by wound care. She is scheduled to see a new wound care center on 1/11 and is hoping to get una boots to help with her swelling and discomfort.   Plan: --pt to f/u with wound care center 1/11

## 2017-07-28 NOTE — Patient Instructions (Addendum)
FOLLOW-UP INSTRUCTIONS When: Feb 1st For: Blood pressure What to bring: your medicines and pill box  Continue taking amlodipine 10mg  daily and lisinopril 10mg  daily. Please fill out your pill box - this will make it easier on busy days to make sure you're taking all the medicines when you usually take them.

## 2017-07-29 LAB — BMP8+ANION GAP
Anion Gap: 15 mmol/L (ref 10.0–18.0)
BUN / CREAT RATIO: 15 (ref 12–28)
BUN: 14 mg/dL (ref 8–27)
CHLORIDE: 100 mmol/L (ref 96–106)
CO2: 24 mmol/L (ref 20–29)
Calcium: 9.1 mg/dL (ref 8.7–10.3)
Creatinine, Ser: 0.94 mg/dL (ref 0.57–1.00)
GFR calc non Af Amer: 64 mL/min/{1.73_m2} (ref >59)
GFR, EST AFRICAN AMERICAN: 74 mL/min/{1.73_m2} (ref >59)
GLUCOSE: 139 mg/dL — AB (ref 65–99)
POTASSIUM: 3.9 mmol/L (ref 3.5–5.2)
Sodium: 139 mmol/L (ref 134–144)

## 2017-07-31 NOTE — Progress Notes (Signed)
Internal Medicine Clinic Attending  Case discussed with Dr. Svalina  at the time of the visit.  We reviewed the resident's history and exam and pertinent patient test results.  I agree with the assessment, diagnosis, and plan of care documented in the resident's note.  

## 2017-08-04 ENCOUNTER — Encounter (HOSPITAL_BASED_OUTPATIENT_CLINIC_OR_DEPARTMENT_OTHER): Payer: Medicaid Other | Attending: Internal Medicine

## 2017-08-04 DIAGNOSIS — I87323 Chronic venous hypertension (idiopathic) with inflammation of bilateral lower extremity: Secondary | ICD-10-CM | POA: Diagnosis not present

## 2017-08-04 DIAGNOSIS — E119 Type 2 diabetes mellitus without complications: Secondary | ICD-10-CM | POA: Insufficient documentation

## 2017-08-04 DIAGNOSIS — I890 Lymphedema, not elsewhere classified: Secondary | ICD-10-CM | POA: Insufficient documentation

## 2017-08-15 ENCOUNTER — Ambulatory Visit (INDEPENDENT_AMBULATORY_CARE_PROVIDER_SITE_OTHER): Payer: Medicaid Other | Admitting: Internal Medicine

## 2017-08-15 ENCOUNTER — Encounter: Payer: Self-pay | Admitting: Internal Medicine

## 2017-08-15 VITALS — BP 156/74 | HR 77 | Temp 97.7°F | Ht 61.0 in | Wt 234.3 lb

## 2017-08-15 DIAGNOSIS — R400 Somnolence: Secondary | ICD-10-CM | POA: Diagnosis not present

## 2017-08-15 DIAGNOSIS — R51 Headache: Secondary | ICD-10-CM

## 2017-08-15 DIAGNOSIS — E1159 Type 2 diabetes mellitus with other circulatory complications: Secondary | ICD-10-CM

## 2017-08-15 DIAGNOSIS — J3089 Other allergic rhinitis: Secondary | ICD-10-CM

## 2017-08-15 DIAGNOSIS — G8929 Other chronic pain: Secondary | ICD-10-CM

## 2017-08-15 DIAGNOSIS — I10 Essential (primary) hypertension: Secondary | ICD-10-CM | POA: Diagnosis present

## 2017-08-15 DIAGNOSIS — Z79891 Long term (current) use of opiate analgesic: Secondary | ICD-10-CM | POA: Diagnosis not present

## 2017-08-15 DIAGNOSIS — W19XXXA Unspecified fall, initial encounter: Secondary | ICD-10-CM | POA: Insufficient documentation

## 2017-08-15 DIAGNOSIS — Z79899 Other long term (current) drug therapy: Secondary | ICD-10-CM | POA: Diagnosis not present

## 2017-08-15 DIAGNOSIS — M25561 Pain in right knee: Secondary | ICD-10-CM | POA: Insufficient documentation

## 2017-08-15 DIAGNOSIS — I872 Venous insufficiency (chronic) (peripheral): Secondary | ICD-10-CM

## 2017-08-15 DIAGNOSIS — I152 Hypertension secondary to endocrine disorders: Secondary | ICD-10-CM

## 2017-08-15 DIAGNOSIS — M25562 Pain in left knee: Secondary | ICD-10-CM | POA: Diagnosis not present

## 2017-08-15 MED ORDER — NAPROXEN 500 MG PO TABS: 500.0000 mg | ORAL_TABLET | Freq: Two times a day (BID) | ORAL | 2 refills | Status: AC

## 2017-08-15 MED ORDER — LISINOPRIL 20 MG PO TABS: 10.0000 mg | ORAL_TABLET | Freq: Every day | ORAL | 2 refills | Status: DC

## 2017-08-15 NOTE — Progress Notes (Signed)
   CC: Hypertension  HPI:  [name] is a 67 y.o. with a PMH of retention, type 2 diabetes, chronic venous stasis in bilateral lower extremities, bilateral knee pain presenting to clinic for follow-up on her hypertension as well as evaluation after a fall.  Hypertension Patient states compliance with lisinopril 10 mg daily and amlodipine 10 mg daily. She reports taking both of these earlier today. She denies chest pain, shortness of breath, vision changes, focal weakness or numbness.  Fall Patient reports falling asleep on the toilet yesterday, and when trying to get up she states she was unable to bend her knees in an appropriate time and ended up falling forward. She does endorse having her head. She endorses a mild generalized headache, but no focal weakness, no nausea, no vomiting, no dizziness, no focal vision changes.  Daytime sleepiness Patient endorses about a year long history of increased sleepiness during the day with multiple episodes of falling asleep in the bathroom among other places. She does report this is about the time that she was started on tramadol for chronic pain which she states she consistently takes every 6 hours; she is also on Allegra for chronic allergic sinusitis.she denied taking any other sedating medications even over-the-counter.   Chronic venostasis Patient with bilateral chronic venostasis complicated by wounds is being followed by wound care clinic. She'll 30 placed on furosemide 40 mg daily to help with the edema. She is currently not a candidate for interventions due to her wounds as reported by her. She has been given prescriptions for compression stockings but has not picked them up yet. She states her orthopedic doctor whom she has seen for bilateral knee pain states that he will not operate on her knees until her vasculature has been adequately assessed and managed; she had ABIs done in 2017 which were unremarkable.  Please see problem based  Assessment and Plan for status of patients chronic conditions.  Past Medical History:  Diagnosis Date  . Allergic rhinitis   . Arthritis    right anke/foot  . Asthma   . Bursitis   . Carpal tunnel syndrome   . Diabetes mellitus without complication ([place])   . GERD (gastroesophageal reflux disease)   . HTN (hypertension)   . Knee pain   . Obesity   . Sciatica   . Tendinitis   . Thumb pain    right    Review of Systems:   ROSPer HPI  Physical Exam:  Vitals:   08/15/17 1517  BP: (!) 146/75  Pulse: 88  Temp: 97.7 F (36.5 C)  TempSrc: Oral  SpO2: 100%  Weight: 234 lb 4.8 oz (106.3 kg)  Height: 5\' 1"  (1.549 m)   GENERAL- alert, co-operative, appears as stated age, not in any distress. HEENT- Atraumatic, normocephalic, PERRL, EOMI, oral mucosa appears moist CARDIAC- RRR, no murmurs, rubs or gallops. RESP- Moving equal volumes of air, and clear to auscultation bilaterally, no wheezes or crackles. ABDOMEN- Soft, nontender NEURO- CN 2-12 intact. EXTREMITIES- bil LE edema with venous stasis and dermatitis  Assessment & Plan:   See Encounters Tab for problem based charting.   Patient discussed with Dr. Andris Baumann, MD Internal Medicine PGY2

## 2017-08-15 NOTE — Assessment & Plan Note (Signed)
Patient currently taking tramadol every 6 hours for bilateral knee pain per wound care. Insetting of increased sleepiness and falls I advised to she limit tramadol use if able and treat pain concurrently with naproxen.  Plan  --Naproxen 500 twice a day when necessary --Advised to limit tramadol use of able

## 2017-08-15 NOTE — Assessment & Plan Note (Signed)
Patient with bilateral chronic venostasis complicated by wounds is being followed by wound care clinic. She'll 30 placed on furosemide 40 mg daily to help with the edema. She is currently not a candidate for interventions due to her wounds as reported by her. She has been given prescriptions for compression stockings but has not picked them up yet. She states her orthopedic doctor whom she has seen for bilateral knee pain states that he will not operate on her knees until her vasculature has been adequately assessed and managed; she had ABIs done in 2017 which were unremarkable.  Plan: --patient to continue following with wound care --advised that she start using compression stockings --advised to elevated LEs to help with edema --when able, to place Una boots with wound care

## 2017-08-15 NOTE — Assessment & Plan Note (Signed)
Patient reports falling asleep on the toilet yesterday, and when trying to get up she states she was unable to bend her knees in an appropriate time and ended up falling forward. She does endorse having her head. She endorses a mild generalized headache, but no focal weakness, no nausea, no vomiting, no dizziness, no focal vision changes..  Patient was no focal neurologic symptoms or focal neuro deficits on exam. She is not on any blood thinners. This time it imaging is not indicated.  Plan --Continue monitoring for neurologic symptoms concerning for intracranial bleed --Increased somnolence and daytime sleepiness addressed as separate problem

## 2017-08-15 NOTE — Assessment & Plan Note (Signed)
Patient endorses about a year long history of increased sleepiness during the day with multiple episodes of falling asleep in the bathroom among other places. She does report this is about the time that she was started on tramadol for chronic pain which she states she consistently takes every 6 hours; she is also on Allegra for chronic allergic sinusitis.she denied taking any other sedating medications even over-the-counter.   Etiology could be sedating medicines: Tramadol and Allegra. Other etiology could be undiagnosed sleep apnea which may have been uncovered by use of sedating medicines.  Plan -- sleep study --if diagnosed with OSA and managed appropriately this may also help with blood pressure control and possibly some of her lower extremity edema

## 2017-08-15 NOTE — Assessment & Plan Note (Signed)
Uncontrolled on lisinopril 10 mg daily and amlodipine 10 mg daily.  Plan --Increase lisinopril to 20 mg daily --Continue amlodipine 10 mg daily --Follow-up in one month for blood pressure check

## 2017-08-15 NOTE — Patient Instructions (Signed)
FOLLOW-UP INSTRUCTIONS When: 1 month For: blood pressure What to bring: your medicines  Please increase your lisinopril to 20mg  daily (can use 2 pills at a time of your current prescription) and continue taking amlodipine 10mg  daily.  Come back in 1 month for blood pressure check.  I have placed a referral for a sleep study to check for sleep apnea; this may be causing your sleepiness.  I would also try to limit your tramadol use and stick to naproxen 500mg  up to twice daily if needed for pain.   For your swelling, since they can't wrap your legs, try to elevate your legs as much as you can.

## 2017-08-16 NOTE — Progress Notes (Signed)
Case discussed with Dr. Svalina at the time of the visit. We reviewed the resident's history and exam and pertinent patient test results. I agree with the assessment, diagnosis, and plan of care documented in the resident's note. 

## 2017-08-18 ENCOUNTER — Telehealth: Payer: Self-pay

## 2017-08-18 NOTE — Telephone Encounter (Signed)
Pt would like Dr boswell to call her back about something. Per patient she do not want to speak with a nurse.

## 2017-08-21 NOTE — Telephone Encounter (Signed)
I do not know why she would request to speak with me. I saw her once in November. She has been seen at 3 subsequent visits with other providers and I am not the PCP. Will defer to PCP to follow up.

## 2017-08-23 ENCOUNTER — Telehealth: Payer: Self-pay | Admitting: *Deleted

## 2017-08-23 NOTE — Telephone Encounter (Signed)
Pt was called and asked if there was anything triage nurse could help her with: 1) she states she had been wanting and waiting for paperwork from her old provider to be completed so she could get an electric scooter or wh/ch, it has been ongoing for 2 yrs. She needs some assistance and guidance. She has falls from struggling to walk and recently had one where she injured her cheek 2) needs an appt w/ her pcp- I do see where dr Reesa Chew has openings Friday 2/1 3) also she would like to know what she needs to do to get her sleep studies done Sending to dr Reesa Chew, front office, chilon, mamie and lela

## 2017-08-23 NOTE — Telephone Encounter (Signed)
I have called her and she states dr boswell had spoken to her about her scooter/ wh/ch problem. She did as he suggested and was told by previous provider that she would finish the paperwork and help her get the scooter or chair and once again it didn't happen. So she is ready to start anew and see if [facility] can help her Note sent to front office, chilon, mamie and lela

## 2017-08-24 NOTE — Telephone Encounter (Signed)
Called and spoke with the patient.  Pt states she has been trying to get a Chief of Staff from her Previous Dr.'s office with Dr Amil Amen but has been unsuccessful.  She would like to obtain one now with our office and has sch an appt on 09/01/2017 with her PCP Dr. Reesa Chew to discuss multiple matter regarding a PWC and her Sleep machine.

## 2017-08-25 NOTE — Telephone Encounter (Signed)
We will address that during her next appointment.

## 2017-08-31 ENCOUNTER — Other Ambulatory Visit (HOSPITAL_COMMUNITY): Payer: Self-pay | Admitting: Otolaryngology

## 2017-08-31 ENCOUNTER — Ambulatory Visit (HOSPITAL_COMMUNITY)
Admission: RE | Admit: 2017-08-31 | Discharge: 2017-08-31 | Disposition: A | Discharge status: Home / Self Care | Payer: Medicaid Other | Source: Ambulatory Visit | Attending: Otolaryngology | Admitting: Otolaryngology

## 2017-08-31 DIAGNOSIS — R6884 Jaw pain: Secondary | ICD-10-CM

## 2017-08-31 DIAGNOSIS — M26622 Arthralgia of left temporomandibular joint: Secondary | ICD-10-CM | POA: Insufficient documentation

## 2017-08-31 DIAGNOSIS — W19XXXA Unspecified fall, initial encounter: Secondary | ICD-10-CM | POA: Diagnosis not present

## 2017-09-01 ENCOUNTER — Encounter: Payer: Self-pay | Admitting: Internal Medicine

## 2017-09-01 ENCOUNTER — Other Ambulatory Visit: Payer: Self-pay

## 2017-09-01 ENCOUNTER — Encounter: Payer: Self-pay | Admitting: Gastroenterology

## 2017-09-01 ENCOUNTER — Ambulatory Visit: Payer: Medicaid Other | Admitting: Internal Medicine

## 2017-09-01 VITALS — BP 133/64 | HR 92 | Temp 98.2°F | Ht 61.0 in | Wt 222.6 lb

## 2017-09-01 DIAGNOSIS — I10 Essential (primary) hypertension: Secondary | ICD-10-CM | POA: Diagnosis not present

## 2017-09-01 DIAGNOSIS — K219 Gastro-esophageal reflux disease without esophagitis: Secondary | ICD-10-CM

## 2017-09-01 DIAGNOSIS — R400 Somnolence: Secondary | ICD-10-CM | POA: Diagnosis not present

## 2017-09-01 DIAGNOSIS — E041 Nontoxic single thyroid nodule: Secondary | ICD-10-CM | POA: Diagnosis not present

## 2017-09-01 DIAGNOSIS — I152 Hypertension secondary to endocrine disorders: Secondary | ICD-10-CM

## 2017-09-01 DIAGNOSIS — E1159 Type 2 diabetes mellitus with other circulatory complications: Secondary | ICD-10-CM

## 2017-09-01 DIAGNOSIS — N950 Postmenopausal bleeding: Secondary | ICD-10-CM | POA: Diagnosis not present

## 2017-09-01 DIAGNOSIS — Z1211 Encounter for screening for malignant neoplasm of colon: Secondary | ICD-10-CM

## 2017-09-01 DIAGNOSIS — Z1239 Encounter for other screening for malignant neoplasm of breast: Secondary | ICD-10-CM

## 2017-09-01 DIAGNOSIS — I872 Venous insufficiency (chronic) (peripheral): Secondary | ICD-10-CM | POA: Diagnosis not present

## 2017-09-01 DIAGNOSIS — E119 Type 2 diabetes mellitus without complications: Secondary | ICD-10-CM

## 2017-09-01 LAB — POCT GLYCOSYLATED HEMOGLOBIN (HGB A1C): Hemoglobin A1C: 8.2

## 2017-09-01 LAB — GLUCOSE, CAPILLARY: Glucose-Capillary: 172 mg/dL — ABNORMAL HIGH (ref 65–99)

## 2017-09-01 MED ORDER — ESOMEPRAZOLE MAGNESIUM 20 MG PO CPDR
20.0000 mg | DELAYED_RELEASE_CAPSULE | Freq: Every morning | ORAL | 2 refills | Status: DC
Start: 2017-09-01 — End: 2017-11-16

## 2017-09-01 MED ORDER — DIPHENHYDRAMINE-ZINC ACETATE 2-0.1 % EX CREA: 1.0000 "application " | TOPICAL_CREAM | Freq: Three times a day (TID) | CUTANEOUS | 0 refills | Status: DC | PRN

## 2017-09-01 MED ORDER — SITAGLIPTIN PHOS-METFORMIN HCL 50-1000 MG PO TABS: 1.0000 | ORAL_TABLET | Freq: Two times a day (BID) | ORAL | 2 refills | Status: DC

## 2017-09-01 NOTE — Assessment & Plan Note (Signed)
She was given a referral for colonoscopy.

## 2017-09-01 NOTE — Patient Instructions (Signed)
Thank you for visiting clinic today. As we discussed I am changing your medicine for diabetes.  Please stop taking Metformin and take Janumet 1 tablet twice daily.  It is a combination pill containing metformin and sitagliptin. I am not making any changes in your blood pressure medicine at this time-we might make some changes during next follow-up visit. I am giving you a referral to see a gynecologist as soon as possible. I am also giving you 1 colonoscopy and mammogram you can schedule at your convenience. We are ordering some blood work, we will call you with any abnormal results. I am also ordering an ultrasound of your neck to check on your thyroid. As we discussed you should use compression stockings, you can also try Ace wrap for your legs. Please follow-up in 4 weeks.

## 2017-09-01 NOTE — Assessment & Plan Note (Signed)
Her appearance is more consistent with lymphedema. She was given a prescription of compression stockings in the past which she never picked up yet.  -She was advised to get compression stockings. -She was also advised to use Ace wrap to help with her swelling. -She was given a prescription of Benadryl cream to use for itching on her hyperkeratotic papules-these papules can get worse because of persistent lymphedema. -We can do skin biopsy if her symptoms persist or she continue to get more lesions.

## 2017-09-01 NOTE — Assessment & Plan Note (Signed)
Her symptoms did improve somewhat after decreasing tramadol. Her pain is controlled on naproxen. She was referred for sleep study during last clinic visit-she has not made an appointment yet.  -She was advised to avoid tramadol. -She was also advised to make an appointment for sleep study to rule out any obstructive sleep apnea.

## 2017-09-01 NOTE — Assessment & Plan Note (Signed)
Mammogram was ordered.

## 2017-09-01 NOTE — Progress Notes (Signed)
   CC: For follow-up of her diabetes, hypertension and chronic lower extremity edema.  HPI:  [name] is a 67 y.o. with past medical history as listed below came to the clinic for follow-up of her diabetes, hypertension and chronic lower extremity edema.  She was complaining of itching in her hyperkeratotic lesions and lower extremities.  She was also upset that wound clinic refused to provide her with a Unna boot as she do not have any open wound.  She was given a prescription of compression stockings in the past which she never picked up.  She also brought a sanitary pad with clot in there stating that she is having vaginal bleeding since wednesday with clotting. Patient do have more than 1 year of amenorrhea which makes her menopausal, but for more than a year now she is getting intermittent irregular vaginal bleeding.  She had an history of uterine fibroid.  She did not saw any gynecologist for this purpose, stating that she did not had any insurance.  She recently got Medicaid.   She has a family history of uterine cancer in her mother.  Please see assessment and plan for her chronic conditions.   Past Medical History:  Diagnosis Date  . Allergic rhinitis   . Arthritis    right anke/foot  . Asthma   . Bursitis   . Carpal tunnel syndrome   . Diabetes mellitus without complication ([place])   . GERD (gastroesophageal reflux disease)   . HTN (hypertension)   . Knee pain   . Obesity   . Sciatica   . Tendinitis   . Thumb pain    right   Review of Systems: Negative except mentioned in HPI.  Physical Exam:  Vitals:   09/01/17 1400  BP: 133/64  Pulse: 92  Temp: 98.2 F (36.8 C)  TempSrc: Oral  SpO2: 100%  Weight: 222 lb 9.6 oz (101 kg)  Height: 5\' 1"  (1.549 m)    General: Vital signs reviewed.  Patient is well-developed and well-nourished, in no acute distress and cooperative with exam.  Head: Normocephalic and atraumatic. Eyes: EOMI, conjunctivae normal, no  scleral icterus.  Neck: Supple, trachea midline, normal ROM, no JVD, masses, thyromegaly, or carotid bruit present.  Cardiovascular: RRR, S1 normal, S2 normal, no murmurs, gallops, or rubs. Pulmonary/Chest: Clear to auscultation bilaterally, no wheezes, rales, or rhonchi. Abdominal: Soft, non-tender, non-distended, BS +, no masses, organomegaly, or guarding present.  Extremities: Bilateral minimally pitting edema  with multiple hyperkeratotic papules more on left lower leg as compared to right, few of them were weeping serosanguineous fluid. Psychiatric: Normal mood and affect. speech and behavior is normal. Cognition and memory are normal.    Assessment & Plan:   See Encounters Tab for problem based charting.  Patient discussed with Dr. Angelia Mould.

## 2017-09-01 NOTE — Assessment & Plan Note (Signed)
Her A1c was increased to 8.2 today from 7.4. Patient do not check her blood sugar at home, stating that she do not like needles.  She do not want to try any medicine which involves needle.  She was using Metformin ER thousand milligrams in the morning and 500 mg at night along with glipizide 20 mg in the morning.  -Discontinue Metformin-start her on Janumet 50-1000 twice daily. -Continue glipizide.

## 2017-09-01 NOTE — Assessment & Plan Note (Signed)
She had a CTA done in July 2017 to rule out an aortic dissection, which shows a 4.3 cm thyroid nodule.  I do not see any workup for that.  Patient denies any heat or cold intolerance.  -We will check TSH. -Thyroid ultrasound.

## 2017-09-01 NOTE — Assessment & Plan Note (Signed)
Her symptoms are concerning, and she also has a family history of uterine cancer in her mother.  She was referred to gynecology for endometrial biopsy and further evaluation and treatment of postmenopausal bleeding.

## 2017-09-01 NOTE — Assessment & Plan Note (Addendum)
BP Readings from Last 3 Encounters:  09/01/17 133/64  08/15/17 (!) 156/74  07/28/17 (!) 155/72   Her blood pressure was within goal today. Her lisinopril was increased to 20 mg during previous clinic visit.  At this time we will continue current management of lisinopril 20 mg daily and amlodipine 10 mg daily.  During next follow-up visit, we will try weaning her off from amlodipine and increasing the dose of lisinopril as amlodipine might be contributory to her lower extremity edema. -We will check BMP today.

## 2017-09-02 LAB — CBC
Hematocrit: 38 % (ref 34.0–46.6)
Hemoglobin: 12.1 g/dL (ref 11.1–15.9)
MCH: 28.9 pg (ref 26.6–33.0)
MCHC: 31.8 g/dL (ref 31.5–35.7)
MCV: 91 fL (ref 79–97)
Platelets: 429 10*3/uL — ABNORMAL HIGH (ref 150–379)
RBC: 4.18 x10E6/uL (ref 3.77–5.28)
RDW: 14.9 % (ref 12.3–15.4)
WBC: 11.6 10*3/uL — ABNORMAL HIGH (ref 3.4–10.8)

## 2017-09-02 LAB — BMP8+ANION GAP
ANION GAP: 15 mmol/L (ref 10.0–18.0)
BUN/Creatinine Ratio: 19 (ref 12–28)
BUN: 18 mg/dL (ref 8–27)
CO2: 25 mmol/L (ref 20–29)
Calcium: 9.5 mg/dL (ref 8.7–10.3)
Chloride: 103 mmol/L (ref 96–106)
Creatinine, Ser: 0.96 mg/dL (ref 0.57–1.00)
GFR calc Af Amer: 72 mL/min/{1.73_m2} (ref >59)
GFR, EST NON AFRICAN AMERICAN: 63 mL/min/{1.73_m2} (ref >59)
GLUCOSE: 139 mg/dL — AB (ref 65–99)
Potassium: 4.2 mmol/L (ref 3.5–5.2)
Sodium: 143 mmol/L (ref 134–144)

## 2017-09-02 LAB — TSH: TSH: 3.11 u[IU]/mL (ref 0.450–4.500)

## 2017-09-02 NOTE — Progress Notes (Signed)
Internal Medicine Clinic Attending  Case discussed with Dr. Amin at the time of the visit.  We reviewed the resident's history and exam and pertinent patient test results.  I agree with the assessment, diagnosis, and plan of care documented in the resident's note.    

## 2017-09-06 ENCOUNTER — Other Ambulatory Visit: Payer: Self-pay | Admitting: Otolaryngology

## 2017-09-06 DIAGNOSIS — S02642D Fracture of ramus of left mandible, subsequent encounter for fracture with routine healing: Secondary | ICD-10-CM

## 2017-09-14 ENCOUNTER — Encounter: Payer: Self-pay | Admitting: Family Medicine

## 2017-09-18 ENCOUNTER — Other Ambulatory Visit: Payer: Self-pay | Admitting: Internal Medicine

## 2017-09-18 ENCOUNTER — Ambulatory Visit
Admission: RE | Admit: 2017-09-18 | Discharge: 2017-09-18 | Disposition: A | Discharge status: Home / Self Care | Payer: Medicaid Other | Source: Ambulatory Visit | Attending: Otolaryngology | Admitting: Otolaryngology

## 2017-09-18 DIAGNOSIS — E041 Nontoxic single thyroid nodule: Secondary | ICD-10-CM

## 2017-09-18 DIAGNOSIS — S02642D Fracture of ramus of left mandible, subsequent encounter for fracture with routine healing: Secondary | ICD-10-CM

## 2017-09-22 ENCOUNTER — Ambulatory Visit (HOSPITAL_COMMUNITY): Payer: Medicaid Other

## 2017-09-22 ENCOUNTER — Other Ambulatory Visit: Payer: Self-pay | Admitting: Internal Medicine

## 2017-09-26 ENCOUNTER — Ambulatory Visit
Admission: RE | Admit: 2017-09-26 | Discharge: 2017-09-26 | Disposition: A | Discharge status: Home / Self Care | Payer: Medicaid Other | Source: Ambulatory Visit | Attending: Internal Medicine | Admitting: Internal Medicine

## 2017-09-26 DIAGNOSIS — E041 Nontoxic single thyroid nodule: Secondary | ICD-10-CM

## 2017-09-27 ENCOUNTER — Other Ambulatory Visit: Payer: Self-pay | Admitting: Internal Medicine

## 2017-09-29 ENCOUNTER — Encounter: Payer: Self-pay | Admitting: Internal Medicine

## 2017-09-29 ENCOUNTER — Encounter: Payer: Medicaid Other | Admitting: Internal Medicine

## 2017-09-29 ENCOUNTER — Ambulatory Visit: Payer: Medicaid Other

## 2017-10-03 DIAGNOSIS — E237 Disorder of pituitary gland, unspecified: Secondary | ICD-10-CM | POA: Diagnosis not present

## 2017-10-10 DIAGNOSIS — H53483 Generalized contraction of visual field, bilateral: Secondary | ICD-10-CM | POA: Diagnosis not present

## 2017-10-10 DIAGNOSIS — H25813 Combined forms of age-related cataract, bilateral: Secondary | ICD-10-CM | POA: Diagnosis not present

## 2017-10-10 DIAGNOSIS — H532 Diplopia: Secondary | ICD-10-CM | POA: Diagnosis not present

## 2017-10-10 DIAGNOSIS — D352 Benign neoplasm of pituitary gland: Secondary | ICD-10-CM | POA: Diagnosis not present

## 2017-10-10 DIAGNOSIS — E119 Type 2 diabetes mellitus without complications: Secondary | ICD-10-CM | POA: Diagnosis not present

## 2017-10-10 LAB — HM DIABETES EYE EXAM

## 2017-10-11 ENCOUNTER — Telehealth: Payer: Self-pay | Admitting: Internal Medicine

## 2017-10-11 NOTE — Telephone Encounter (Signed)
Patient would like for you to call her back as soon as possible about her Results from the Eye Dr and her ENT Dr.  Patient is very concerned.

## 2017-10-17 ENCOUNTER — Other Ambulatory Visit: Payer: Self-pay | Admitting: Internal Medicine

## 2017-10-17 DIAGNOSIS — E119 Type 2 diabetes mellitus without complications: Secondary | ICD-10-CM

## 2017-10-19 ENCOUNTER — Encounter: Payer: Medicaid Other | Admitting: Family Medicine

## 2017-10-19 ENCOUNTER — Other Ambulatory Visit (HOSPITAL_COMMUNITY): Payer: Self-pay | Admitting: Neurosurgery

## 2017-10-19 ENCOUNTER — Ambulatory Visit: Payer: Medicaid Other

## 2017-10-19 DIAGNOSIS — D352 Benign neoplasm of pituitary gland: Secondary | ICD-10-CM | POA: Diagnosis not present

## 2017-10-19 DIAGNOSIS — I10 Essential (primary) hypertension: Secondary | ICD-10-CM | POA: Diagnosis not present

## 2017-10-19 DIAGNOSIS — Z6841 Body Mass Index (BMI) 40.0 and over, adult: Secondary | ICD-10-CM | POA: Diagnosis not present

## 2017-10-20 ENCOUNTER — Other Ambulatory Visit: Payer: Self-pay

## 2017-10-20 ENCOUNTER — Ambulatory Visit (INDEPENDENT_AMBULATORY_CARE_PROVIDER_SITE_OTHER): Payer: Medicare Other | Admitting: Internal Medicine

## 2017-10-20 ENCOUNTER — Encounter: Payer: Self-pay | Admitting: Internal Medicine

## 2017-10-20 VITALS — BP 164/77 | HR 87 | Temp 98.0°F | Ht 61.0 in | Wt 237.1 lb

## 2017-10-20 DIAGNOSIS — M170 Bilateral primary osteoarthritis of knee: Secondary | ICD-10-CM | POA: Diagnosis not present

## 2017-10-20 DIAGNOSIS — E041 Nontoxic single thyroid nodule: Secondary | ICD-10-CM

## 2017-10-20 DIAGNOSIS — E1159 Type 2 diabetes mellitus with other circulatory complications: Secondary | ICD-10-CM | POA: Diagnosis present

## 2017-10-20 DIAGNOSIS — I10 Essential (primary) hypertension: Secondary | ICD-10-CM | POA: Diagnosis not present

## 2017-10-20 DIAGNOSIS — N950 Postmenopausal bleeding: Secondary | ICD-10-CM | POA: Diagnosis not present

## 2017-10-20 DIAGNOSIS — J329 Chronic sinusitis, unspecified: Secondary | ICD-10-CM

## 2017-10-20 DIAGNOSIS — Z79899 Other long term (current) drug therapy: Secondary | ICD-10-CM

## 2017-10-20 DIAGNOSIS — G8929 Other chronic pain: Secondary | ICD-10-CM

## 2017-10-20 DIAGNOSIS — M25561 Pain in right knee: Secondary | ICD-10-CM

## 2017-10-20 DIAGNOSIS — D352 Benign neoplasm of pituitary gland: Secondary | ICD-10-CM

## 2017-10-20 DIAGNOSIS — L859 Epidermal thickening, unspecified: Secondary | ICD-10-CM

## 2017-10-20 DIAGNOSIS — I890 Lymphedema, not elsewhere classified: Secondary | ICD-10-CM | POA: Diagnosis not present

## 2017-10-20 DIAGNOSIS — M25562 Pain in left knee: Secondary | ICD-10-CM

## 2017-10-20 MED ORDER — LISINOPRIL 40 MG PO TABS: 40.0000 mg | ORAL_TABLET | Freq: Every day | ORAL | 11 refills | Status: DC

## 2017-10-20 NOTE — Assessment & Plan Note (Signed)
She has extensive lymphedema and pain in her both knees, legs and feet which makes her mobility very limited.  She uses crutches and unable to perform her ADLs.  She is looking to get a power wheelchair. She was provided with some paperwork which she forget to bring it today. She will either drop that paperwork at the front desk or bring it with her during next follow-up appointment.  Zabrina, [place] is here for a mobility examination for powerchair assessment. The patient's medical conditions which have contributed to the powerchair need include bilateral knee arthritis, significant lymphedema with hyperkeratotic skin lesions, standing on her feet causes significant pain. The patient requires the powerchair to complete ADL's including moving around the house, cooking, toileting and to perform all her ADLs. The patient is  unable to use a cane or walker due to pain whenever she stands on her feet. The patient's pain today is 10 on a scale of 1-10 The patient's muscle strength rated on a 0-5 scale is listed in the exam. Ms. Betina Puckett has the physical and mental ability to operate a power wheelchair safely in his home and is motivated to use the power wheelchair.  -Please sign her papers to get a power wheelchair as I have already done the exam and necessity charting.

## 2017-10-20 NOTE — Assessment & Plan Note (Signed)
She will have her thyroid biopsy on October 31, 2017.

## 2017-10-20 NOTE — Assessment & Plan Note (Signed)
BP Readings from Last 3 Encounters:  10/20/17 (!) 164/77  09/01/17 133/64  08/15/17 (!) 156/74   Her blood pressure remained elevated today. According to patient she is very anxious because of her current health issues and an upcoming pituitary surgery. She denies any headache or blurry vision.  -Increase lisinopril to 40 mg daily. -Continue amlodipine. -Follow-up in 2 weeks for blood pressure check. -Will need a BMP during that follow-up.

## 2017-10-20 NOTE — Progress Notes (Signed)
   CC: For follow-up of her diabetes and hypertension, and to get evaluation for power wheelchair.  HPI:  [name] is a 67 y.o. with past medical history as listed below came to the clinic for follow-up of her hypertension and diabetes.  She also wants [place] to sign her papers to get a power wheelchair as she is unable to walk around her house due to her pain in her legs and feet and significant lymphedema.  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  Brain MRI with pituitary protocol has been done and she saw a neurosurgeon yesterday-according to patient plan is to go ahead for surgery in 2-3 weeks.  Recently she was going through a lot because of those multiple appointments with this new diagnosis and missed her mammogram and Ob/ GYN appointment.  See assessment and plan for her chronic conditions.  Past Medical History:  Diagnosis Date  . Allergic rhinitis   . Arthritis    right anke/foot  . Asthma   . Bursitis   . Carpal tunnel syndrome   . Diabetes mellitus without complication ([place])   . GERD (gastroesophageal reflux disease)   . HTN (hypertension)   . Knee pain   . Obesity   . Sciatica   . Tendinitis   . Thumb pain    right   Review of Systems: Negative except mentioned in HPI.  Physical Exam:  Vitals:   10/20/17 1543 10/20/17 1613  BP: (!) 155/75 (!) 164/77  Pulse: 94 87  Temp: 98 F (36.7 C)   TempSrc: Oral   SpO2: 100% 100%  Weight: 237 lb 1.6 oz (107.5 kg)   Height: 5\' 1"  (1.549 m)    General: Vital signs reviewed.  Patient is well-developed and well-nourished, in no acute distress and cooperative with exam.  Head: Normocephalic and atraumatic. Eyes: EOMI, conjunctivae normal, no scleral icterus.  Cardiovascular: RRR, S1 normal, S2 normal, no murmurs, gallops, or rubs. Pulmonary/Chest: Clear to auscultation bilaterally, no wheezes, rales, or  rhonchi. Abdominal: Soft, non-tender, non-distended, BS +, no masses, organomegaly, or guarding present.  Extremities: Extensive lower extremity lymphedema with multiple hyperkeratotic lesions few of them was with serosanguineous discharge. Neurological: A&O x3, strength is 4/5 in lower extremity and 5/5 in upper extremity, unable to walk without pain, using crutches to help with ambulation. Skin: Warm, dry and intact. No rashes or erythema. Psychiatric: Normal mood and affect. speech and behavior is normal. Cognition and memory are normal.  Assessment & Plan:   See Encounters Tab for problem based charting.  Patient discussed with Dr. Lynnae January.

## 2017-10-20 NOTE — Patient Instructions (Addendum)
Thank you for visiting clinic today. As we discussed your blood pressure was high today, I am increasing the dose of lisinopril to 40 mg daily. Please make an appointment with your gynecologist according to your convenience to discuss this irregular bleeding which has started after menopause. Please follow-up in 2 weeks for your blood pressure check.

## 2017-10-20 NOTE — Assessment & Plan Note (Signed)
Missed her appointment with gynecology because of conflict with another appointment.  She was provided with a new referral to see a gynecologist for her postmenopausal bleeding as patient continued to have intermittent postmenopausal vaginal bleeding.

## 2017-10-20 NOTE — Progress Notes (Signed)
Internal Medicine Clinic Attending  Case discussed with Dr. Amin at the time of the visit.  We reviewed the resident's history and exam and pertinent patient test results.  I agree with the assessment, diagnosis, and plan of care documented in the resident's note.    

## 2017-10-24 ENCOUNTER — Encounter: Payer: Medicaid Other | Admitting: Gastroenterology

## 2017-10-25 ENCOUNTER — Ambulatory Visit (HOSPITAL_COMMUNITY)
Admission: RE | Admit: 2017-10-25 | Discharge: 2017-10-25 | Disposition: A | Discharge status: Home / Self Care | Payer: Medicare Other | Source: Ambulatory Visit | Attending: Neurosurgery | Admitting: Neurosurgery

## 2017-10-25 ENCOUNTER — Encounter (HOSPITAL_COMMUNITY): Payer: Self-pay

## 2017-10-27 ENCOUNTER — Other Ambulatory Visit: Payer: Self-pay | Admitting: Neurosurgery

## 2017-10-28 ENCOUNTER — Other Ambulatory Visit: Payer: Self-pay | Admitting: Internal Medicine

## 2017-10-28 DIAGNOSIS — K219 Gastro-esophageal reflux disease without esophagitis: Secondary | ICD-10-CM

## 2017-10-30 ENCOUNTER — Inpatient Hospital Stay (HOSPITAL_COMMUNITY)
Admission: EM | Admit: 2017-10-30 | Discharge: 2017-11-02 | DRG: 071 | Disposition: A | Discharge status: Skilled Nursing Facility | Payer: Medicare Other | Attending: Student in an Organized Health Care Education/Training Program | Admitting: Student in an Organized Health Care Education/Training Program

## 2017-10-30 ENCOUNTER — Emergency Department (HOSPITAL_COMMUNITY): Payer: Medicare Other

## 2017-10-30 ENCOUNTER — Other Ambulatory Visit: Payer: Self-pay

## 2017-10-30 ENCOUNTER — Encounter (HOSPITAL_COMMUNITY): Payer: Self-pay

## 2017-10-30 DIAGNOSIS — M25561 Pain in right knee: Secondary | ICD-10-CM | POA: Diagnosis not present

## 2017-10-30 DIAGNOSIS — R488 Other symbolic dysfunctions: Secondary | ICD-10-CM | POA: Diagnosis not present

## 2017-10-30 DIAGNOSIS — Z885 Allergy status to narcotic agent status: Secondary | ICD-10-CM | POA: Diagnosis not present

## 2017-10-30 DIAGNOSIS — G9340 Encephalopathy, unspecified: Secondary | ICD-10-CM | POA: Diagnosis not present

## 2017-10-30 DIAGNOSIS — E876 Hypokalemia: Secondary | ICD-10-CM | POA: Diagnosis present

## 2017-10-30 DIAGNOSIS — R531 Weakness: Secondary | ICD-10-CM | POA: Diagnosis not present

## 2017-10-30 DIAGNOSIS — K760 Fatty (change of) liver, not elsewhere classified: Secondary | ICD-10-CM | POA: Diagnosis not present

## 2017-10-30 DIAGNOSIS — R404 Transient alteration of awareness: Secondary | ICD-10-CM | POA: Diagnosis not present

## 2017-10-30 DIAGNOSIS — J45909 Unspecified asthma, uncomplicated: Secondary | ICD-10-CM | POA: Diagnosis present

## 2017-10-30 DIAGNOSIS — G930 Cerebral cysts: Secondary | ICD-10-CM | POA: Diagnosis not present

## 2017-10-30 DIAGNOSIS — M170 Bilateral primary osteoarthritis of knee: Secondary | ICD-10-CM | POA: Diagnosis present

## 2017-10-30 DIAGNOSIS — D352 Benign neoplasm of pituitary gland: Secondary | ICD-10-CM | POA: Diagnosis present

## 2017-10-30 DIAGNOSIS — W1830XA Fall on same level, unspecified, initial encounter: Secondary | ICD-10-CM | POA: Diagnosis present

## 2017-10-30 DIAGNOSIS — S199XXA Unspecified injury of neck, initial encounter: Secondary | ICD-10-CM | POA: Diagnosis not present

## 2017-10-30 DIAGNOSIS — Z79899 Other long term (current) drug therapy: Secondary | ICD-10-CM

## 2017-10-30 DIAGNOSIS — I152 Hypertension secondary to endocrine disorders: Secondary | ICD-10-CM | POA: Diagnosis present

## 2017-10-30 DIAGNOSIS — I872 Venous insufficiency (chronic) (peripheral): Secondary | ICD-10-CM | POA: Diagnosis not present

## 2017-10-30 DIAGNOSIS — R0602 Shortness of breath: Secondary | ICD-10-CM | POA: Diagnosis not present

## 2017-10-30 DIAGNOSIS — K219 Gastro-esophageal reflux disease without esophagitis: Secondary | ICD-10-CM | POA: Diagnosis present

## 2017-10-30 DIAGNOSIS — E119 Type 2 diabetes mellitus without complications: Secondary | ICD-10-CM

## 2017-10-30 DIAGNOSIS — Z880 Allergy status to penicillin: Secondary | ICD-10-CM

## 2017-10-30 DIAGNOSIS — Z791 Long term (current) use of non-steroidal anti-inflammatories (NSAID): Secondary | ICD-10-CM

## 2017-10-30 DIAGNOSIS — G8929 Other chronic pain: Secondary | ICD-10-CM | POA: Diagnosis not present

## 2017-10-30 DIAGNOSIS — I10 Essential (primary) hypertension: Secondary | ICD-10-CM | POA: Diagnosis present

## 2017-10-30 DIAGNOSIS — E1142 Type 2 diabetes mellitus with diabetic polyneuropathy: Secondary | ICD-10-CM | POA: Diagnosis not present

## 2017-10-30 DIAGNOSIS — R296 Repeated falls: Secondary | ICD-10-CM | POA: Diagnosis not present

## 2017-10-30 DIAGNOSIS — R2681 Unsteadiness on feet: Secondary | ICD-10-CM | POA: Diagnosis not present

## 2017-10-30 DIAGNOSIS — E1159 Type 2 diabetes mellitus with other circulatory complications: Secondary | ICD-10-CM | POA: Diagnosis present

## 2017-10-30 DIAGNOSIS — Z6841 Body Mass Index (BMI) 40.0 and over, adult: Secondary | ICD-10-CM | POA: Diagnosis not present

## 2017-10-30 DIAGNOSIS — M25562 Pain in left knee: Secondary | ICD-10-CM | POA: Diagnosis not present

## 2017-10-30 DIAGNOSIS — R400 Somnolence: Secondary | ICD-10-CM | POA: Diagnosis not present

## 2017-10-30 DIAGNOSIS — E1169 Type 2 diabetes mellitus with other specified complication: Secondary | ICD-10-CM | POA: Diagnosis present

## 2017-10-30 DIAGNOSIS — Z794 Long term (current) use of insulin: Secondary | ICD-10-CM | POA: Diagnosis not present

## 2017-10-30 DIAGNOSIS — S0990XA Unspecified injury of head, initial encounter: Secondary | ICD-10-CM | POA: Diagnosis not present

## 2017-10-30 DIAGNOSIS — Z23 Encounter for immunization: Secondary | ICD-10-CM | POA: Diagnosis not present

## 2017-10-30 DIAGNOSIS — R011 Cardiac murmur, unspecified: Secondary | ICD-10-CM | POA: Diagnosis not present

## 2017-10-30 DIAGNOSIS — Z881 Allergy status to other antibiotic agents status: Secondary | ICD-10-CM | POA: Diagnosis not present

## 2017-10-30 DIAGNOSIS — Z888 Allergy status to other drugs, medicaments and biological substances status: Secondary | ICD-10-CM | POA: Diagnosis not present

## 2017-10-30 DIAGNOSIS — R4182 Altered mental status, unspecified: Secondary | ICD-10-CM | POA: Diagnosis not present

## 2017-10-30 DIAGNOSIS — G9349 Other encephalopathy: Secondary | ICD-10-CM | POA: Diagnosis present

## 2017-10-30 DIAGNOSIS — E041 Nontoxic single thyroid nodule: Secondary | ICD-10-CM

## 2017-10-30 DIAGNOSIS — I890 Lymphedema, not elsewhere classified: Secondary | ICD-10-CM | POA: Diagnosis present

## 2017-10-30 DIAGNOSIS — G9341 Metabolic encephalopathy: Secondary | ICD-10-CM | POA: Diagnosis not present

## 2017-10-30 DIAGNOSIS — Z7984 Long term (current) use of oral hypoglycemic drugs: Secondary | ICD-10-CM

## 2017-10-30 DIAGNOSIS — M6281 Muscle weakness (generalized): Secondary | ICD-10-CM | POA: Diagnosis not present

## 2017-10-30 HISTORY — DX: Migraine, unspecified, not intractable, without status migrainosus: G43.909

## 2017-10-30 HISTORY — DX: Pneumonia, unspecified organism: J18.9

## 2017-10-30 HISTORY — DX: Type 2 diabetes mellitus without complications: E11.9

## 2017-10-30 LAB — URINALYSIS, ROUTINE W REFLEX MICROSCOPIC
Bacteria, UA: NONE SEEN
Bilirubin Urine: NEGATIVE
Glucose, UA: NEGATIVE mg/dL
Hgb urine dipstick: NEGATIVE
Ketones, ur: NEGATIVE mg/dL
Leukocytes, UA: NEGATIVE
Nitrite: NEGATIVE
Protein, ur: 30 mg/dL — AB
Specific Gravity, Urine: 1.01 (ref 1.005–1.030)
pH: 8 (ref 5.0–8.0)

## 2017-10-30 LAB — RAPID URINE DRUG SCREEN, HOSP PERFORMED
Amphetamines: NOT DETECTED
Barbiturates: NOT DETECTED
Benzodiazepines: NOT DETECTED
COCAINE: NOT DETECTED
OPIATES: NOT DETECTED
TETRAHYDROCANNABINOL: NOT DETECTED

## 2017-10-30 LAB — BLOOD GAS, VENOUS

## 2017-10-30 LAB — I-STAT VENOUS BLOOD GAS, ED
Bicarbonate: 27.1 mmol/L (ref 20.0–28.0)
O2 Saturation: 50 %
PO2 VEN: 29 mmHg — AB (ref 32.0–45.0)
TCO2: 29 mmol/L (ref 22–32)
pCO2, Ven: 49.7 mmHg (ref 44.0–60.0)
pH, Ven: 7.345 (ref 7.250–7.430)

## 2017-10-30 LAB — CBC
HCT: 37.8 % (ref 36.0–46.0)
HEMOGLOBIN: 11.9 g/dL — AB (ref 12.0–15.0)
MCH: 28.5 pg (ref 26.0–34.0)
MCHC: 31.5 g/dL (ref 30.0–36.0)
MCV: 90.4 fL (ref 78.0–100.0)
Platelets: 340 10*3/uL (ref 150–400)
RBC: 4.18 MIL/uL (ref 3.87–5.11)
RDW: 15 % (ref 11.5–15.5)
WBC: 13.8 10*3/uL — ABNORMAL HIGH (ref 4.0–10.5)

## 2017-10-30 LAB — COMPREHENSIVE METABOLIC PANEL
ALK PHOS: 82 U/L (ref 38–126)
ALT: 15 U/L (ref 14–54)
ANION GAP: 11 (ref 5–15)
AST: 18 U/L (ref 15–41)
Albumin: 3.1 g/dL — ABNORMAL LOW (ref 3.5–5.0)
BUN: 11 mg/dL (ref 6–20)
CALCIUM: 9 mg/dL (ref 8.9–10.3)
CO2: 24 mmol/L (ref 22–32)
Chloride: 103 mmol/L (ref 101–111)
Creatinine, Ser: 0.87 mg/dL (ref 0.44–1.00)
GFR calc non Af Amer: >60 mL/min (ref >60)
Glucose, Bld: 121 mg/dL — ABNORMAL HIGH (ref 65–99)
Potassium: 3.4 mmol/L — ABNORMAL LOW (ref 3.5–5.1)
SODIUM: 138 mmol/L (ref 135–145)
TOTAL PROTEIN: 8.3 g/dL — AB (ref 6.5–8.1)
Total Bilirubin: 0.5 mg/dL (ref 0.3–1.2)

## 2017-10-30 LAB — AMMONIA: Ammonia: 29 umol/L (ref 9–35)

## 2017-10-30 LAB — ETHANOL: Alcohol, Ethyl (B): <10 mg/dL (ref <10)

## 2017-10-30 LAB — CREATININE, SERUM: Creatinine, Ser: 0.84 mg/dL (ref 0.44–1.00)

## 2017-10-30 LAB — GLUCOSE, CAPILLARY: Glucose-Capillary: 76 mg/dL (ref 65–99)

## 2017-10-30 LAB — I-STAT CG4 LACTIC ACID, ED: Lactic Acid, Venous: 1.52 mmol/L (ref 0.5–1.9)

## 2017-10-30 LAB — CBG MONITORING, ED: Glucose-Capillary: 105 mg/dL — ABNORMAL HIGH (ref 65–99)

## 2017-10-30 MED ORDER — ENOXAPARIN SODIUM 40 MG/0.4ML ~~LOC~~ SOLN
40.0000 mg | SUBCUTANEOUS | Status: DC
  Administered 2017-10-30: 40 mg via SUBCUTANEOUS
  Filled 2017-10-30 (×2): qty 0.4

## 2017-10-30 MED ORDER — MOMETASONE FURO-FORMOTEROL FUM 100-5 MCG/ACT IN AERO
2.0000 | INHALATION_SPRAY | Freq: Two times a day (BID) | RESPIRATORY_TRACT | Status: DC
  Administered 2017-10-31 – 2017-11-02 (×5): 2 via RESPIRATORY_TRACT
  Filled 2017-10-30: qty 8.8

## 2017-10-30 MED ORDER — PNEUMOCOCCAL VAC POLYVALENT 25 MCG/0.5ML IJ INJ
0.5000 mL | INJECTION | INTRAMUSCULAR | Status: AC
  Administered 2017-10-31: 0.5 mL via INTRAMUSCULAR
  Filled 2017-10-30: qty 0.5

## 2017-10-30 MED ORDER — POTASSIUM CHLORIDE CRYS ER 20 MEQ PO TBCR
40.0000 meq | EXTENDED_RELEASE_TABLET | Freq: Once | ORAL | Status: AC
  Administered 2017-10-30: 40 meq via ORAL
  Filled 2017-10-30: qty 2

## 2017-10-30 MED ORDER — KETOROLAC TROMETHAMINE 15 MG/ML IJ SOLN
15.0000 mg | Freq: Three times a day (TID) | INTRAMUSCULAR | Status: AC | PRN
  Administered 2017-10-31: 15 mg via INTRAVENOUS
  Filled 2017-10-30: qty 1

## 2017-10-30 MED ORDER — ACETAMINOPHEN 325 MG PO TABS
650.0000 mg | ORAL_TABLET | Freq: Four times a day (QID) | ORAL | Status: DC | PRN
  Administered 2017-10-30 – 2017-11-02 (×5): 650 mg via ORAL
  Filled 2017-10-30 (×5): qty 2

## 2017-10-30 MED ORDER — AMLODIPINE BESYLATE 10 MG PO TABS
10.0000 mg | ORAL_TABLET | Freq: Every day | ORAL | Status: DC
  Administered 2017-10-30 – 2017-11-02 (×4): 10 mg via ORAL
  Filled 2017-10-30 (×5): qty 2

## 2017-10-30 MED ORDER — IPRATROPIUM-ALBUTEROL 0.5-2.5 (3) MG/3ML IN SOLN
3.0000 mL | Freq: Once | RESPIRATORY_TRACT | Status: AC
  Administered 2017-10-30: 3 mL via RESPIRATORY_TRACT
  Filled 2017-10-30: qty 3

## 2017-10-30 NOTE — ED Triage Notes (Signed)
Pt from home with fall around 3am pt has been falling asleep a lot more than normal and having increased weakness

## 2017-10-30 NOTE — ED Provider Notes (Signed)
  Physical Exam  BP (!) 150/73   Pulse 86   Temp 98.7 F (37.1 C) (Oral)   Resp 14   Ht 5\' 1"  (1.549 m)   Wt 105.2 kg (232 lb)   LMP 04/08/2013   SpO2 97%   BMI 43.84 kg/m   Physical Exam  Constitutional: She appears well-developed and well-nourished. No distress.  Asleep but easily arousable. Will fall back asleep quickly.  HENT:  Head: Normocephalic and atraumatic.  Eyes: Conjunctivae and EOM are normal. No scleral icterus.  Neck: Normal range of motion.  Pulmonary/Chest: Effort normal. No respiratory distress.  Neurological: She is alert.  Skin: No rash noted. She is not diaphoretic.  Psychiatric: She has a normal mood and affect.  Nursing note and vitals reviewed.   ED Course/Procedures     Procedures  MDM  Care handed off from previous provider M. Nils Flack PA-C.  Please see their note for further detail.  Briefly, patient presents for fall that occurred approximately 12 hours ago.  She has been sleeping much more than usual and having increased generalized weakness and fell as a result of it. Brought in by neighbors.  She does have a history of recently diagnosed pituitary adenoma with suprasellar extension, which was diagnosed in Feb 2019.  She has been coordinating with neurosurgery in the next month for surgery.  History is limited due to patient's condition and no family members at bedside. Breathing improved with DuoNeb treatment.  AMS labwork unremarkable.  Plan is to admit after imaging including CT of head and neck, chest x-ray returns.    1645: CT of the head and neck return is unremarkable.  Chest x-ray negative as well.  Patient will need to be admitted for her altered mental status. Will consult internal medicine for admission.  1715: Internal medicine team consulted who will come see the patient and admit. Appreciate their help for management of this patient.   Portions of this note were generated with [employer]. Dictation errors may occur  despite best attempts at proofreading.    Delia Heady, PA-C 10/30/17 1715    Isla Pence, MD 10/30/17 [phone]

## 2017-10-30 NOTE — H&P (Addendum)
Date: 10/31/2017               Patient Name:  Mariah [name] MRN: [id number]  DOB: Feb 20, 1951 Age / Sex: 67 y.o., female   PCP: [name], MD         Medical Service: Internal Medicine Teaching Service         Attending Physician: Dr. Evette Doffing, Mallie Mussel, *    First Contact: Dr. Berline Lopes Pager: [phone]  Second Contact: Dr. Hetty Ely Pager: [phone]       After Hours (After 5p/  First Contact Pager: [phone]  weekends / holidays): Second Contact Pager: [phone]   Chief Complaint: Weakness and possible fall  History of Present Illness: Mariah [name] is a 67 yo F with a PMHx notable for HTN, GERD, DMII, morbid obesity, daytime sleepiness, chronic venous statis dermatitis, and asthma who presented today for evaluation reportedly for a fall. She was unable to participate in the evaluation to a significant extent given her confusion and acute encephalopathy. She demonstrated intermittent unreliable communication. As per report, she has been  increasingly drowsy for the past week, occassionally falling asleep while walking but denied head injury or LOC. She has reportedly been leaning to one wide while walking using the wall for support.   She denied headache, nausea, cough, chest pain, abdominal pain, but again this is unreliable.   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 UA was unremarkable. VBG also unremarkable. CMP with mild hypokalemia of 3.4. CBC with mild leukocytosis or 13.8.  Meds:  Current Meds  Medication Sig  . albuterol (PROVENTIL HFA;VENTOLIN HFA) 108 (90 Base) MCG/ACT inhaler Inhale 1-2 puffs into the lungs every 6 (six) hours as needed for wheezing or shortness of breath.  [name] amLODipine (NORVASC) 10 MG tablet Take 1 tablet (10 mg total) by mouth daily. (Patient taking differently: Take 5 mg by mouth 2 (two) times daily. )  . diphenhydrAMINE-zinc acetate (BENADRYL EXTRA  STRENGTH) cream Apply 1 application topically 3 (three) times daily as needed for itching.  . esomeprazole (NEXIUM) 20 MG capsule Take 1 capsule (20 mg total) by mouth every morning. For acid reflex  . furosemide (LASIX) 40 MG tablet Take 40 mg by mouth.  [name] glipiZIDE (GLUCOTROL XL) 10 MG 24 hr tablet Take 2 tablets (20 mg total) by mouth daily with breakfast.  . lisinopril (PRINIVIL,ZESTRIL) 10 MG tablet Take 20 mg by mouth 2 (two) times daily.   . mometasone (NASONEX) 50 MCG/ACT nasal spray Place 1 spray into the nose as needed.  . naproxen (NAPROSYN) 500 MG tablet Take 1 tablet (500 mg total) by mouth 2 (two) times daily with a meal.  . sitaGLIPtin-metformin (JANUMET) 50-1000 MG tablet Take 1 tablet by mouth 2 (two) times daily with a meal.   Allergies: Allergies as of 10/30/2017 - Review Complete 10/30/2017  Allergen Reaction Noted  . Codeine Nausea And Vomiting   . Doxycycline Itching and Nausea And Vomiting 07/10/2008  . Indomethacin Diarrhea 10/14/2014  . Penicillins Hives    Past Medical History:  Diagnosis Date  . Allergic rhinitis   . Arthritis    "ankles, feet, knees" (10/30/2017)  . Asthma   . Bursitis   . Carpal tunnel syndrome   . GERD (gastroesophageal reflux disease)   . HTN (hypertension)   . Knee pain   . Migraine    "a couple/yr" (10/30/2017)  . Obesity   .  Pneumonia 1960s X 1  . Sciatica   . Tendinitis   . Thumb pain    right  . Type II diabetes mellitus ([place])     Family History:  Unable to assess due to medical condition.   Social History:  Unable to assess due to medical condition.   Review of Systems: Unable to fully assess due to medical condition. See HPI for details obtained.  Physical Exam: Blood pressure (!) 145/71, pulse 92, temperature 98.8 F (37.1 C), resp. rate 18, height 5\' 1"  (1.549 m), weight 232 lb (105.2 kg), last menstrual period 04/08/2013, SpO2 99 %. Physical Exam  Constitutional: She appears well-developed and well-nourished.  She appears listless. No distress.  HENT:  Head: Normocephalic and atraumatic.  Eyes: Conjunctivae and EOM are normal. Right eye exhibits no discharge. Left eye exhibits no discharge.  Neck: Normal range of motion. Neck supple.  Cardiovascular: Normal rate and regular rhythm.  No murmur heard. Pulmonary/Chest: Effort normal. No stridor. No tachypnea and no bradypnea. No respiratory distress. She has rhonchi in the right upper field, the right middle field, the right lower field, the left upper field, the left middle field and the left lower field.  Abdominal: Soft. Bowel sounds are normal. She exhibits no distension. There is no tenderness.  Musculoskeletal: She exhibits deformity (Marked deformity of the bilateral distal extremities with scarring venous statis skin manifistations ). She exhibits no tenderness.  Neurological: She appears listless. She is disoriented. No cranial nerve deficit. GCS eye subscore is 3. GCS verbal subscore is 4. GCS motor subscore is 5.  Skin: Skin is warm. Capillary refill takes less than 2 seconds. She is not diaphoretic.  Psychiatric: She has a normal mood and affect.  Nursing note and vitals reviewed.  EKG: personally reviewed my interpretation is NSR, no acute ST depression or T-wave abnormalities noted. Abnormal exam  CXR: personally reviewed my interpretation is no acute cardiopulmonary abnormalities.   Assessment & Plan by Problem: Active Problems:   Hypertension associated with diabetes ([place])   Asthma   Chronic venous stasis dermatitis of both lower extremities   Type 2 diabetes mellitus treated without insulin ([facility])   Acute encephalopathy   Encephalopathy acute  Assessment: This is a 67 yo F with a PMHx notable for HTN, daytime sleepiness, NAFLD, who presented with possible fall and drowsiness. Given her acute encephalopathy and lack of family present at bedside it is unclear as to the series of events leading up to the current presentation. 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 It appears that she has begun and extensive outpatient evaluation without clear evidence as to the cause of her daytime sleepiness. Concern for a metabolic process is high. A call was placed to the emergency contact number without answer, we will attempt contact again in the morning.   Plan: Acute encephalopathy: Unclear if this is secondary to medication induced, metabolic, seizure, intracranial structural abnormality given her pituitary mass on CT, with infectious less likely although not impossible. I am concerned that this may be related to medication overuse given the intermittent nature of her encephalopathy vs possible personality disorder given that she resisted a significant portion of the exam. However, she has concerning CT imaging findings that warrant evaluation. Also considered were thyroid abnormalities but her clinical picture does not clearly correlate with either hypo/hyperthyroidism.  --Blood Cx ordered --EEG for potential focal events --UDS pending --Trend fever, WBC's, vitals  Insulin independent  diabetes: Patient on glipizide and Janumet at home. She was unable to elicit if these were taken today or if she is compliant with them. No clear evidence of hypo/hyperglycemia. --Will monitor CBG daily  Hypokalemia: Potassium 3.4, treated with PO 71mEq  Asthma: No acute issues noted. Minor rhonchi on pulmonary auscultation with negative CXR. Lungs cleared significantly with coughing.  Continue home Advair   Hypertension: BP 164/75.  Amlodipine 10mg  daily  Diet: NPO Code: Prior, unable to assess Fluids: n/a VTE ppx: Lovenox Dispo: Admit patient to Inpatient with expected length of stay greater than 2 midnights.  Signed: Kathi Ludwig, MD 10/31/2017, 11:33 AM  Pager: Pager# [phone]

## 2017-10-30 NOTE — ED Provider Notes (Signed)
[facility] Provider Note   CSN: [id number] Arrival date & time: 10/30/17  1237     History   Chief Complaint Chief Complaint  Patient presents with  . Weakness  . Fall   Level V caveat due to altered mental status.  HPI Mariah [name] is a 67 y.o. 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  RN states that patient came via EMS after a fall at 3 AM.  Apparently the patient told the RN that she has been sleeping much more than normal and having increased weakness.  On my examination, the patient is quite sleepy.  She is easily arousable but will not stay awake longer than a few seconds before falling asleep again.  She will follow commands and attempt to answer questions when she is awake for these few seconds but quickly falls back asleep.  She has difficulty giving me an accurate history at times but from what I can gather she fell yesterday and a few days prior to that.  She denies head injury or loss of consciousness.  She does complain of some shortness of breath to me.  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  She is coordinating with a neurosurgeon for surgery in the next couple of weeks.  She also received paperwork for a power wheelchair and she has been complaining to her primary care physician that she is unable to get around her house due to pain in her legs and feet secondary to her significant and chronic lymphedema.  The history is provided by the patient. The history is limited by the condition of the patient.    Past Medical History:  Diagnosis Date  . Allergic rhinitis   . Arthritis    "ankles, feet, knees" (10/30/2017)  . Asthma   . Bursitis   . Carpal tunnel syndrome   . GERD (gastroesophageal reflux disease)   . HTN  (hypertension)   . Knee pain   . Migraine    "a couple/yr" (10/30/2017)  . Obesity   . Pneumonia 1960s X 1  . Sciatica   . Tendinitis   . Thumb pain    right  . Type II diabetes mellitus [facility])     Patient Active Problem List   Diagnosis Date Noted  . Acute encephalopathy 10/30/2017  . Encephalopathy acute 10/30/2017  . Postmenopausal bleeding 09/01/2017  . Screening for colon cancer 09/01/2017  . Thyroid nodule 09/01/2017  . Daytime sleepiness 08/15/2017  . Bilateral knee pain 08/15/2017  . Fall 08/15/2017  . Screening for breast cancer 05/25/2017  . NAFLD (nonalcoholic fatty liver disease) 05/24/2017  . Morbid obesity ([place]) 10/21/2012  . Type 2 diabetes mellitus treated without insulin ([place]) 09/18/2012  . FIBROIDS, UTERUS 07/10/2008  . Chronic venous stasis dermatitis of both lower extremities 07/10/2008  . Hypertension associated with diabetes ([place]) 04/26/2007  . Allergic rhinitis 04/26/2007  . Asthma 04/26/2007    Past Surgical History:  Procedure Laterality Date  . GANGLION CYST EXCISION Right   . KNEE ARTHROSCOPY Left 2003  . PLANTAR FASCIA RELEASE Right 1995   "heel"  . TONSILLECTOMY  1958  . [place]; 1997   Dr [name]     OB History   None      Home Medications    Prior to  Admission medications   Medication Sig Start Date End Date Taking? Authorizing Provider  albuterol (PROVENTIL HFA;VENTOLIN HFA) 108 (90 Base) MCG/ACT inhaler Inhale 1-2 puffs into the lungs every 6 (six) hours as needed for wheezing or shortness of breath. 07/17/16  Yes Law, Alexandra M, PA-C  amLODipine (NORVASC) 10 MG tablet Take 1 tablet (10 mg total) by mouth daily. Patient taking differently: Take 5 mg by mouth 2 (two) times daily.  07/27/17  Yes Axel Filler, MD  diphenhydrAMINE-zinc acetate (BENADRYL EXTRA STRENGTH) cream Apply 1 application topically 3 (three) times daily as needed for itching. 09/01/17  Yes [name], MD  esomeprazole (NEXIUM) 20  MG capsule Take 1 capsule (20 mg total) by mouth every morning. For acid reflex 09/01/17  Yes [name], MD  furosemide (LASIX) 40 MG tablet Take 40 mg by mouth.   Yes [provider]  glipiZIDE (GLUCOTROL XL) 10 MG 24 hr tablet Take 2 tablets (20 mg total) by mouth daily with breakfast. 05/25/17  Yes Zada Finders, MD  lisinopril (PRINIVIL,ZESTRIL) 10 MG tablet Take 20 mg by mouth 2 (two) times daily.  10/11/17  Yes [provider]  mometasone (NASONEX) 50 MCG/ACT nasal spray Place 1 spray into the nose as needed. 10/14/14  Yes [provider]  naproxen (NAPROSYN) 500 MG tablet Take 1 tablet (500 mg total) by mouth 2 (two) times daily with a meal. 08/15/17 08/15/18 Yes Alphonzo Grieve, MD  sitaGLIPtin-metformin (JANUMET) 50-1000 MG tablet Take 1 tablet by mouth 2 (two) times daily with a meal. 09/01/17  Yes [name], MD  lisinopril (PRINIVIL,ZESTRIL) 40 MG tablet Take 1 tablet (40 mg total) by mouth daily. Patient not taking: Reported on 10/30/2017 10/20/17   [name], MD    Family History Family History  Problem Relation Age of Onset  . Uterine cancer Mother   . Hypertension Mother   . Heart disease Mother   . Hypertension Father   . Alzheimer's disease Father   . Cancer Other     Social History Social History   Tobacco Use  . Smoking status: Never Smoker  . Smokeless tobacco: Never Used  Substance Use Topics  . Alcohol use: No  . Drug use: No     Allergies   Codeine; Doxycycline; Indomethacin; and Penicillins   Review of Systems Review of Systems  Unable to perform ROS: Mental status change     Physical Exam Updated Vital Signs BP (!) 153/75 (BP Location: Left Arm)   Pulse 76   Temp 97.8 F (36.6 C) (Oral)   Resp 20   Ht 5\' 1"  (1.549 m)   Wt 105.2 kg (232 lb)   LMP 04/08/2013   SpO2 99%   BMI 43.84 kg/m   Physical Exam  Constitutional: She appears well-developed and well-nourished. No distress.  HENT:  Head: Normocephalic  and atraumatic.  No Battle's signs, no raccoon's eyes, no rhinorrhea. No hemotympanum. No tenderness to palpation of the face or skull. No deformity, crepitus, or swelling noted.   Eyes: Conjunctivae are normal. Right eye exhibits no discharge. Left eye exhibits no discharge.  Neck: Normal range of motion. Neck supple. No JVD present. No tracheal deviation present.  No midline spine TTP, no paraspinal muscle tenderness, no deformity, crepitus, or step-off noted   Cardiovascular: Normal rate.  Pulmonary/Chest: Effort normal. She has wheezes.  Scattered rhonchi and inspiratory and expiratory wheezing.  Equal rise and fall of chest  Abdominal: Soft. Bowel sounds are normal. She exhibits no distension. There  is no tenderness. There is no guarding.  Musculoskeletal: She exhibits no edema.  Lymphedema of the bilateral lower extremities with multiple hyperkeratotic papules L >R, no erythema.  Chronic venous stasis changes of the lower extremities noted.  1/5 strength of bilateral hip flexors, 0/5 strength of remainder of bilateral lower extremity major muscle groups.  Good grip strength bilaterally  Neurological: GCS eye subscore is 3. GCS verbal subscore is 4. GCS motor subscore is 6.  Extremely sleepy on examination.  Easily arousable but only for a few seconds at a time before falling asleep again.  She is oriented to person and place as well as time but has difficulty answering some of my questions comprehensively due to her inability to stay awake.  She is able to follow commands without difficulty when she is awake.   Skin: Skin is warm and dry. No erythema.  Psychiatric: She has a normal mood and affect. Her behavior is normal.  Nursing note and vitals reviewed.    ED Treatments / Results  Labs (all labs ordered are listed, but only abnormal results are displayed) Labs Reviewed  COMPREHENSIVE METABOLIC PANEL - Abnormal; Notable for the following components:      Result Value   Potassium  3.4 (*)    Glucose, Bld 121 (*)    Total Protein 8.3 (*)    Albumin 3.1 (*)    All other components within normal limits  CBC - Abnormal; Notable for the following components:   WBC 13.8 (*)    Hemoglobin 11.9 (*)    All other components within normal limits  URINALYSIS, ROUTINE W REFLEX MICROSCOPIC - Abnormal; Notable for the following components:   Color, Urine STRAW (*)    Protein, ur 30 (*)    Squamous Epithelial / LPF 0-5 (*)    All other components within normal limits  BASIC METABOLIC PANEL - Abnormal; Notable for the following components:   Calcium 8.6 (*)    All other components within normal limits  T4, FREE - Abnormal; Notable for the following components:   Free T4 1.17 (*)    All other components within normal limits  CBG MONITORING, ED - Abnormal; Notable for the following components:   Glucose-Capillary 105 (*)    All other components within normal limits  I-STAT VENOUS BLOOD GAS, ED - Abnormal; Notable for the following components:   pO2, Ven 29.0 (*)    All other components within normal limits  CULTURE, BLOOD (ROUTINE X 2)  CULTURE, BLOOD (ROUTINE X 2)  BLOOD GAS, VENOUS  AMMONIA  RAPID URINE DRUG SCREEN, HOSP PERFORMED  ETHANOL  HIV ANTIBODY (ROUTINE TESTING)  CREATININE, SERUM  GLUCOSE, CAPILLARY  TSH  VITAMIN G26  FOLLICLE STIMULATING HORMONE  LUTEINIZING HORMONE  PROLACTIN  INSULIN-LIKE GROWTH FACTOR  BASIC METABOLIC PANEL  I-STAT CG4 LACTIC ACID, ED    EKG None  Radiology Dg Chest 2 View  Result Date: 10/30/2017 CLINICAL DATA:  67 year old female with shortness of breath. EXAM: CHEST - 2 VIEW COMPARISON:  Chest radiographs 07/17/2016 and earlier. FINDINGS: Lung volumes are stable and within normal limits. Mediastinal contours remain normal. Visualized tracheal air column is within normal limits. No pneumothorax, pulmonary edema, pleural effusion or confluent pulmonary opacity. No acute osseous abnormality identified. Negative visible bowel gas  pattern. IMPRESSION: No acute cardiopulmonary abnormality. Electronically Signed   By: Genevie Ann M.D.   On: 10/30/2017 16:46   Ct Head Wo Contrast  Result Date: 10/30/2017 CLINICAL DATA:  Fall.  Weakness. EXAM:  CT HEAD WITHOUT CONTRAST CT CERVICAL SPINE WITHOUT CONTRAST TECHNIQUE: Multidetector CT imaging of the head and cervical spine was performed following the standard protocol without intravenous contrast. Multiplanar CT image reconstructions of the cervical spine were also generated. COMPARISON:  10/03/2017 FINDINGS: CT HEAD FINDINGS Brain: No evidence of acute infarction, hemorrhage, hydrocephalus, or extra-axial collection. There is mild diffuse low-attenuation within the subcortical and periventricular white matter compatible with chronic microvascular disease. Again noted is a mass within the sella turcica and suprasellar cistern, image 33/6. Vascular: No hyperdense vessel or unexpected calcification. Skull: Normal. Negative for fracture or focal lesion. Sinuses/Orbits: Fluid levels identified within bilateral maxillary sinuses. Other: None CT CERVICAL SPINE FINDINGS Alignment: Straightening of normal cervical lordosis. Anterolisthesis of C4 on C5 identified. Skull base and vertebrae: No fractures identified. Soft tissues and spinal canal: No prevertebral fluid or swelling. No visible canal hematoma. Disc levels: Multi level disc space narrowing and ventral endplate spurring noted extending from C3-4 through C6-7. Upper chest: There is a large solid-appearing mass arising from the left lobe of thyroid gland and isthmus measuring 4.1 cm, image 64/8. Other: None IMPRESSION: 1. No acute intracranial abnormalities. Chronic small vessel ischemic disease noted. 2. No evidence for cervical spine fracture or dislocation. Advanced cervical spondylosis noted. 3. 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 4. **An incidental finding of potential clinical significance has been found. 4.1 cm  solid-appearing nodule within the thyroid gland is noted.** Consider further evaluation with thyroid ultrasound. If patient is clinically hyperthyroid, consider nuclear medicine thyroid uptake and scan. 5. Bilateral maxillary sinus fluid levels. Electronically Signed   By: Kerby Moors M.D.   On: 10/30/2017 16:40   Ct Cervical Spine Wo Contrast  Result Date: 10/30/2017 CLINICAL DATA:  Fall.  Weakness. EXAM: CT HEAD WITHOUT CONTRAST CT CERVICAL SPINE WITHOUT CONTRAST TECHNIQUE: Multidetector CT imaging of the head and cervical spine was performed following the standard protocol without intravenous contrast. Multiplanar CT image reconstructions of the cervical spine were also generated. COMPARISON:  10/03/2017 FINDINGS: CT HEAD FINDINGS Brain: No evidence of acute infarction, hemorrhage, hydrocephalus, or extra-axial collection. There is mild diffuse low-attenuation within the subcortical and periventricular white matter compatible with chronic microvascular disease. Again noted is a mass within the sella turcica and suprasellar cistern, image 33/6. Vascular: No hyperdense vessel or unexpected calcification. Skull: Normal. Negative for fracture or focal lesion. Sinuses/Orbits: Fluid levels identified within bilateral maxillary sinuses. Other: None CT CERVICAL SPINE FINDINGS Alignment: Straightening of normal cervical lordosis. Anterolisthesis of C4 on C5 identified. Skull base and vertebrae: No fractures identified. Soft tissues and spinal canal: No prevertebral fluid or swelling. No visible canal hematoma. Disc levels: Multi level disc space narrowing and ventral endplate spurring noted extending from C3-4 through C6-7. Upper chest: There is a large solid-appearing mass arising from the left lobe of thyroid gland and isthmus measuring 4.1 cm, image 64/8. Other: None IMPRESSION: 1. No acute intracranial abnormalities. Chronic small vessel ischemic disease noted. 2. No evidence for cervical spine fracture or  dislocation. Advanced cervical spondylosis noted. 3. 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 4. **An incidental finding of potential clinical significance has been found. 4.1 cm solid-appearing nodule within the thyroid gland is noted.** Consider further evaluation with thyroid ultrasound. If patient is clinically hyperthyroid, consider nuclear medicine thyroid uptake and scan. 5. Bilateral maxillary sinus fluid levels. Electronically Signed   By: Kerby Moors M.D.   On: 10/30/2017 16:40    Procedures  Procedures (including critical care time)  Medications Ordered in ED Medications  mometasone-formoterol (DULERA) 100-5 MCG/ACT inhaler 2 puff (2 puffs Inhalation Given 10/31/17 0746)  amLODipine (NORVASC) tablet 10 mg (10 mg Oral Given 10/31/17 0944)  acetaminophen (TYLENOL) tablet 650 mg (650 mg Oral Given 10/30/17 2325)  ketorolac (TORADOL) 15 MG/ML injection 15 mg (15 mg Intravenous Given 10/31/17 1548)  enoxaparin (LOVENOX) injection 50 mg (has no administration in time range)  ipratropium-albuterol (DUONEB) 0.5-2.5 (3) MG/3ML nebulizer solution 3 mL (3 mLs Nebulization Given 10/30/17 1409)  potassium chloride SA (K-DUR,KLOR-CON) CR tablet 40 mEq (40 mEq Oral Given 10/30/17 1951)  pneumococcal 23 valent vaccine (PNU-IMMUNE) injection 0.5 mL (0.5 mLs Intramuscular Given 10/31/17 0941)     Initial Impression / Assessment and Plan / ED Course  I have reviewed the triage vital signs and the nursing notes.  Pertinent labs & imaging results that were available during my care of the patient were reviewed by me and considered in my medical decision making (see chart for details).     Patient presents with altered mental status.  I am unable to ascertain a good history from this patient as she does not remain alert long enough to answer my questions.  She does follow commands and answer questions when she is awake but this is transient.  She is afebrile, hypertensive in the ED.   She does have wheezes on auscultation of the lungs with improvement after breathing treatment and is intermittently tachypneic.  Plan to assess for altered mental status with lab work, chest x-ray, imaging of the head and neck (cannot rule out injury after apparent fall).    Lab work shows no hypercarbia, pH within normal limits, very mild hypokalemia, mild leukocytosis.  UA is not concerning for UTI or nephrolithiasis.  She does not appear to be septic at this time.  Ammonia is within normal limits.  UDS is negative. 5:00PM Signed out to oncoming provider PA [place].  We are awaiting chest x-ray, head CT, and cervical spine CT results.  On reevaluation, patient remains sleepy but easily arousable.  Plan to admit for altered mental status. Final Clinical Impressions(s) / ED Diagnoses   Final diagnoses:  Thyroid nodule  Acute encephalopathy    ED Discharge Orders    None       Renita Papa, PA-C 10/31/17 [place], Marianne, DO 10/31/17 1959

## 2017-10-31 ENCOUNTER — Other Ambulatory Visit: Payer: Medicaid Other

## 2017-10-31 DIAGNOSIS — Z7984 Long term (current) use of oral hypoglycemic drugs: Secondary | ICD-10-CM

## 2017-10-31 DIAGNOSIS — R400 Somnolence: Secondary | ICD-10-CM

## 2017-10-31 DIAGNOSIS — R531 Weakness: Secondary | ICD-10-CM

## 2017-10-31 DIAGNOSIS — K760 Fatty (change of) liver, not elsewhere classified: Secondary | ICD-10-CM

## 2017-10-31 DIAGNOSIS — E119 Type 2 diabetes mellitus without complications: Secondary | ICD-10-CM

## 2017-10-31 DIAGNOSIS — D352 Benign neoplasm of pituitary gland: Secondary | ICD-10-CM

## 2017-10-31 DIAGNOSIS — E876 Hypokalemia: Secondary | ICD-10-CM

## 2017-10-31 DIAGNOSIS — R011 Cardiac murmur, unspecified: Secondary | ICD-10-CM

## 2017-10-31 DIAGNOSIS — Z79899 Other long term (current) drug therapy: Secondary | ICD-10-CM

## 2017-10-31 DIAGNOSIS — I10 Essential (primary) hypertension: Secondary | ICD-10-CM

## 2017-10-31 DIAGNOSIS — G9340 Encephalopathy, unspecified: Secondary | ICD-10-CM

## 2017-10-31 LAB — BASIC METABOLIC PANEL
Anion gap: 9 (ref 5–15)
BUN: 11 mg/dL (ref 6–20)
CHLORIDE: 106 mmol/L (ref 101–111)
CO2: 24 mmol/L (ref 22–32)
Calcium: 8.6 mg/dL — ABNORMAL LOW (ref 8.9–10.3)
Creatinine, Ser: 0.93 mg/dL (ref 0.44–1.00)
GFR calc non Af Amer: >60 mL/min (ref >60)
Glucose, Bld: 88 mg/dL (ref 65–99)
POTASSIUM: 3.7 mmol/L (ref 3.5–5.1)
Sodium: 139 mmol/L (ref 135–145)

## 2017-10-31 LAB — TSH: TSH: 2.749 u[IU]/mL (ref 0.350–4.500)

## 2017-10-31 LAB — GLUCOSE, CAPILLARY: GLUCOSE-CAPILLARY: 177 mg/dL — AB (ref 65–99)

## 2017-10-31 LAB — HIV ANTIBODY (ROUTINE TESTING W REFLEX): HIV Screen 4th Generation wRfx: NONREACTIVE

## 2017-10-31 LAB — T4, FREE: Free T4: 1.17 ng/dL — ABNORMAL HIGH (ref 0.61–1.12)

## 2017-10-31 LAB — VITAMIN B12: VITAMIN B 12: 498 pg/mL (ref 180–914)

## 2017-10-31 MED ORDER — ENOXAPARIN SODIUM 60 MG/0.6ML ~~LOC~~ SOLN
50.0000 mg | SUBCUTANEOUS | Status: DC
  Administered 2017-10-31 – 2017-11-02 (×2): 50 mg via SUBCUTANEOUS
  Filled 2017-10-31 (×2): qty 0.6

## 2017-10-31 NOTE — Consult Note (Signed)
Chief Complaint   Chief Complaint  Patient presents with  . Weakness  . Fall    HPI   HPI: Mariah [name] is a 67 y.o. female who was admitted yesterday after fall.    During ER workup, she was extremely drowsy and unable to participate with history.  She was admitted due to altered mental status.   She was called by our office today to schedule surgery (transsphenoidal resection of pituitary tumor) due to known pituitary adenoma. She advised our office that she was in the hospital and asked [place] to see her.   Late Sunday evening, she was sitting in a chair reading her mail.   She woke abruptly realizing she was falling just prior to landing on her left side.  She endorses left shoulder and left hip pain from the fall.   She tried to get up by herself but was unsuccessful. She laid on the ground until Monday morning when she called  EMS who brought her to the hospital for evaluation.   She reports over the past several days, she has noticed hallucinations (snakes on cabinets) and increased drowsiness.   Important to note she was seen by her primary care doctor 1 week prior to the onset of these symptoms and her blood pressure medication was adjusted.   Per in office note dated 10/20/2017 by primary care doctor, her lisinopril was increased to 40 mg daily.     She feels much better since being hospitalized  Although has not been out of bed to ambulate yet.  She denies any headaches, changes in her vision including blurry vision, double vision and peripheral vision loss, nausea, vomiting, dizziness. She endorses left sided pain from the fall, but otherwise, denies focal deficits.   Patient Active Problem List   Diagnosis Date Noted  . Acute encephalopathy 10/30/2017  . Encephalopathy acute 10/30/2017  . Postmenopausal bleeding 09/01/2017  . Screening for colon cancer 09/01/2017  . Thyroid nodule 09/01/2017  . Daytime sleepiness 08/15/2017  . Bilateral knee pain 08/15/2017  . Fall 08/15/2017   . Screening for breast cancer 05/25/2017  . NAFLD (nonalcoholic fatty liver disease) 05/24/2017  . Morbid obesity ([place]) 10/21/2012  . Type 2 diabetes mellitus treated without insulin ([place]) 09/18/2012  . FIBROIDS, UTERUS 07/10/2008  . Chronic venous stasis dermatitis of both lower extremities 07/10/2008  . Hypertension associated with diabetes ([place]) 04/26/2007  . Allergic rhinitis 04/26/2007  . Asthma 04/26/2007    PMH: Past Medical History:  Diagnosis Date  . Allergic rhinitis   . Arthritis    "ankles, feet, knees" (10/30/2017)  . Asthma   . Bursitis   . Carpal tunnel syndrome   . GERD (gastroesophageal reflux disease)   . HTN (hypertension)   . Knee pain   . Migraine    "a couple/yr" (10/30/2017)  . Obesity   . Pneumonia 1960s X 1  . Sciatica   . Tendinitis   . Thumb pain    right  . Type II diabetes mellitus ([facility])     PSH: Past Surgical History:  Procedure Laterality Date  . GANGLION CYST EXCISION Right   . KNEE ARTHROSCOPY Left 2003  . PLANTAR FASCIA RELEASE Right 1995   "heel"  . TONSILLECTOMY  1958  . [place]; 1997   Dr [name]    Medications Prior to Admission  Medication Sig Dispense Refill Last Dose  . albuterol (PROVENTIL HFA;VENTOLIN HFA) 108 (90 Base) MCG/ACT inhaler Inhale 1-2 puffs into the lungs  every 6 (six) hours as needed for wheezing or shortness of breath. 1 Inhaler 0 10/27/2017 at prn  . amLODipine (NORVASC) 10 MG tablet Take 1 tablet (10 mg total) by mouth daily. (Patient taking differently: Take 5 mg by mouth 2 (two) times daily. ) 30 tablet 5 10/29/2017  . diphenhydrAMINE-zinc acetate (BENADRYL EXTRA STRENGTH) cream Apply 1 application topically 3 (three) times daily as needed for itching. 28.4 g 0 Past Week at prn  . esomeprazole (NEXIUM) 20 MG capsule Take 1 capsule (20 mg total) by mouth every morning. For acid reflex 90 capsule 2 10/29/2017  . furosemide (LASIX) 40 MG tablet Take 40 mg by mouth.   10/28/2017  . glipiZIDE  (GLUCOTROL XL) 10 MG 24 hr tablet Take 2 tablets (20 mg total) by mouth daily with breakfast. 60 tablet 1 10/29/2017  . lisinopril (PRINIVIL,ZESTRIL) 10 MG tablet Take 20 mg by mouth 2 (two) times daily.   2 10/29/2017 at Unknown time  . mometasone (NASONEX) 50 MCG/ACT nasal spray Place 1 spray into the nose as needed.   unknown at prn  . naproxen (NAPROSYN) 500 MG tablet Take 1 tablet (500 mg total) by mouth 2 (two) times daily with a meal. 60 tablet 2 10/26/2017  . sitaGLIPtin-metformin (JANUMET) 50-1000 MG tablet Take 1 tablet by mouth 2 (two) times daily with a meal. 60 tablet 2 10/27/2017  . lisinopril (PRINIVIL,ZESTRIL) 40 MG tablet Take 1 tablet (40 mg total) by mouth daily. (Patient not taking: Reported on 10/30/2017) 30 tablet 11 10/29/2017 at Unknown time    SH: Social History   Tobacco Use  . Smoking status: Never Smoker  . Smokeless tobacco: Never Used  Substance Use Topics  . Alcohol use: No  . Drug use: No    MEDS: Prior to Admission medications   Medication Sig Start Date End Date Taking? Authorizing Provider  albuterol (PROVENTIL HFA;VENTOLIN HFA) 108 (90 Base) MCG/ACT inhaler Inhale 1-2 puffs into the lungs every 6 (six) hours as needed for wheezing or shortness of breath. 07/17/16  Yes Law, Alexandra M, PA-C  amLODipine (NORVASC) 10 MG tablet Take 1 tablet (10 mg total) by mouth daily. Patient taking differently: Take 5 mg by mouth 2 (two) times daily.  07/27/17  Yes Axel Filler, MD  diphenhydrAMINE-zinc acetate (BENADRYL EXTRA STRENGTH) cream Apply 1 application topically 3 (three) times daily as needed for itching. 09/01/17  Yes [name], MD  esomeprazole (NEXIUM) 20 MG capsule Take 1 capsule (20 mg total) by mouth every morning. For acid reflex 09/01/17  Yes [name], MD  furosemide (LASIX) 40 MG tablet Take 40 mg by mouth.   Yes [provider]  glipiZIDE (GLUCOTROL XL) 10 MG 24 hr tablet Take 2 tablets (20 mg total) by mouth daily with breakfast. 05/25/17   Yes Zada Finders, MD  lisinopril (PRINIVIL,ZESTRIL) 10 MG tablet Take 20 mg by mouth 2 (two) times daily.  10/11/17  Yes [provider]  mometasone (NASONEX) 50 MCG/ACT nasal spray Place 1 spray into the nose as needed. 10/14/14  Yes [provider]  naproxen (NAPROSYN) 500 MG tablet Take 1 tablet (500 mg total) by mouth 2 (two) times daily with a meal. 08/15/17 08/15/18 Yes Alphonzo Grieve, MD  sitaGLIPtin-metformin (JANUMET) 50-1000 MG tablet Take 1 tablet by mouth 2 (two) times daily with a meal. 09/01/17  Yes [name], MD  lisinopril (PRINIVIL,ZESTRIL) 40 MG tablet Take 1 tablet (40 mg total) by mouth daily. Patient not taking: Reported on 10/30/2017 10/20/17  [name], MD    ALLERGY:   Social History   Tobacco Use  . Smoking status: Never Smoker  . Smokeless tobacco: Never Used  Substance Use Topics  . Alcohol use: No     Family History  Problem Relation Age of Onset  . Uterine cancer Mother   . Hypertension Mother   . Heart disease Mother   . Hypertension Father   . Alzheimer's disease Father   . Cancer Other      ROS   Review of Systems  Constitutional: Positive for malaise/fatigue (resolved). Negative for chills, fever and weight loss.  Eyes: Negative for blurred vision, double vision, photophobia, pain, discharge and redness.  Cardiovascular: Positive for leg swelling (chronic lymphedema).  Gastrointestinal: Negative for nausea and vomiting.  Musculoskeletal: Negative for back pain, joint pain, myalgias and neck pain.  Neurological: Positive for loss of consciousness. Negative for dizziness, tingling, tremors, sensory change, speech change, focal weakness, seizures, weakness and headaches.    Exam   Vitals:   10/31/17 0747 10/31/17 0939  BP:  (!) 145/71  Pulse:  92  Resp:    Temp:    SpO2: 99%    General appearance: WDWN, resting comfortably, communicative. Partakes in history and exam as expected  Musculoskeletal: MAEW, restricted  strength LUE and LLE secondary to pain.  Neurological Awake, alert, oriented Memory and concentration grossly intact Speech fluent, appropriate CNII: Visual fields normal CNIII/IV/VI: EOMI CNV: Facial sensation normal CNVII: Symmetric, normal strength CNVIII: Grossly normal CNIX: Normal palate movement CNXI: Trap and SCM strength normal CN XII: Tongue protrusion normal Sensation grossly intact to LT Negative pronator drift Peripheral vision grossly intact  Results - Imaging/Labs   Results for orders placed or performed during the hospital encounter of 10/30/17 (from the past 48 hour(s))  Comprehensive metabolic panel     Status: Abnormal   Collection Time: 10/30/17  1:04 PM  Result Value Ref Range   Sodium 138 135 - 145 mmol/L   Potassium 3.4 (L) 3.5 - 5.1 mmol/L   Chloride 103 101 - 111 mmol/L   CO2 24 22 - 32 mmol/L   Glucose, Bld 121 (H) 65 - 99 mg/dL   BUN 11 6 - 20 mg/dL   Creatinine, Ser 0.87 0.44 - 1.00 mg/dL   Calcium 9.0 8.9 - 10.3 mg/dL   Total Protein 8.3 (H) 6.5 - 8.1 g/dL   Albumin 3.1 (L) 3.5 - 5.0 g/dL   AST 18 15 - 41 U/L   ALT 15 14 - 54 U/L   Alkaline Phosphatase 82 38 - 126 U/L   Total Bilirubin 0.5 0.3 - 1.2 mg/dL   GFR calc non Af Amer >60 >60 mL/min   GFR calc Af Amer >60 >60 mL/min    Comment: (NOTE) The eGFR has been calculated using the CKD EPI equation. This calculation has not been validated in all clinical situations. eGFR's persistently <60 mL/min signify possible Chronic Kidney Disease.    Anion gap 11 5 - 15    Comment: Performed at [facility] [address], [place] [address]  CBC     Status: Abnormal   Collection Time: 10/30/17  1:04 PM  Result Value Ref Range   WBC 13.8 (H) 4.0 - 10.5 K/uL   RBC 4.18 3.87 - 5.11 MIL/uL   Hemoglobin 11.9 (L) 12.0 - 15.0 g/dL   HCT 37.8 36.0 - 46.0 %   MCV 90.4 78.0 - 100.0 fL   MCH 28.5 26.0 - 34.0  pg   MCHC 31.5 30.0 - 36.0 g/dL   RDW 15.0 11.5 - 15.5 %   Platelets 340 150  - 400 K/uL    Comment: Performed at [facility], [place] [address], [place], [place] [address]  Blood gas, venous     Status: None   Collection Time: 10/30/17  1:36 PM  Result Value Ref Range   FIO2 TEST WILL BE CREDITED    O2 Content TEST WILL BE CREDITED L/min   Delivery systems TEST WILL BE CREDITED    Mode TEST WILL BE CREDITED    VT TEST WILL BE CREDITED mL   LHR TEST WILL BE CREDITED resp/min   Hi Frequency JET Vent Rate TEST WILL BE CREDITED    Peep/cpap TEST WILL BE CREDITED cm H20   PIP TEST WILL BE CREDITED cm H2O   Hi Frequency JET Vent PIP TEST WILL BE CREDITED    Pressure support TEST WILL BE CREDITED cm H20   Pressure control TEST WILL BE CREDITED cm H20   pH, Ven TEST WILL BE CREDITED 7.250 - 7.430   pCO2, Ven TEST WILL BE CREDITED 44.0 - 60.0 mmHg   pO2, Ven TEST WILL BE CREDITED 32.0 - 45.0 mmHg   Bicarbonate TEST WILL BE CREDITED 20.0 - 28.0 mmol/L   Acid-Base Excess TEST WILL BE CREDITED 0.0 - 2.0 mmol/L   Acid-base deficit TEST WILL BE CREDITED 0.0 - 2.0 mmol/L   O2 Saturation TEST WILL BE CREDITED %   Patient temperature TEST WILL BE CREDITED    Amplitude TEST WILL BE CREDITED    Map TEST WILL BE CREDITED cmH20   Hertz TEST WILL BE CREDITED    Nitric Oxide TEST WILL BE CREDITED    Collection site TEST WILL BE CREDITED    Drawn by TEST WILL BE CREDITED    Sample type TEST WILL BE CREDITED    Mechanical Rate TEST WILL BE CREDITED   Ammonia     Status: None   Collection Time: 10/30/17  1:53 PM  Result Value Ref Range   Ammonia 29 9 - 35 umol/L    Comment: Performed at [facility], [address] [place], [place] [address]  POC CBG, ED     Status: Abnormal   Collection Time: 10/30/17  1:58 PM  Result Value Ref Range   Glucose-Capillary 105 (H) 65 - 99 mg/dL  I-Stat CG4 Lactic Acid, ED     Status: None   Collection Time: 10/30/17  2:17 PM  Result Value Ref Range   Lactic Acid, Venous 1.52 0.5 - 1.9 mmol/L  I-Stat venous blood gas, ED      Status: Abnormal   Collection Time: 10/30/17  2:17 PM  Result Value Ref Range   pH, Ven 7.345 7.250 - 7.430   pCO2, Ven 49.7 44.0 - 60.0 mmHg   pO2, Ven 29.0 (LL) 32.0 - 45.0 mmHg   Bicarbonate 27.1 20.0 - 28.0 mmol/L   TCO2 29 22 - 32 mmol/L   O2 Saturation 50.0 %   Patient temperature HIDE    Sample type VENOUS    Comment NOTIFIED PHYSICIAN   Urinalysis, Routine w reflex microscopic     Status: Abnormal   Collection Time: 10/30/17  5:25 PM  Result Value Ref Range   Color, Urine STRAW (A) YELLOW   APPearance CLEAR CLEAR   Specific Gravity, Urine 1.010 1.005 - 1.030   pH 8.0 5.0 - 8.0   Glucose, UA NEGATIVE NEGATIVE  mg/dL   Hgb urine dipstick NEGATIVE NEGATIVE   Bilirubin Urine NEGATIVE NEGATIVE   Ketones, ur NEGATIVE NEGATIVE mg/dL   Protein, ur 30 (A) NEGATIVE mg/dL   Nitrite NEGATIVE NEGATIVE   Leukocytes, UA NEGATIVE NEGATIVE   RBC / HPF 0-5 0 - 5 RBC/hpf   WBC, UA 0-5 0 - 5 WBC/hpf   Bacteria, UA NONE SEEN NONE SEEN   Squamous Epithelial / LPF 0-5 (A) NONE SEEN    Comment: Performed at [facility], [place] [address] [place], [place] [address]  Ethanol     Status: None   Collection Time: 10/30/17  8:10 PM  Result Value Ref Range   Alcohol, Ethyl (B) <10 <10 mg/dL    Comment:        LOWEST DETECTABLE LIMIT FOR SERUM ALCOHOL IS 10 mg/dL FOR MEDICAL PURPOSES ONLY Performed at [facility], [place] [address] [place], [place] [address]   Creatinine, serum     Status: None   Collection Time: 10/30/17  8:11 PM  Result Value Ref Range   Creatinine, Ser 0.84 0.44 - 1.00 mg/dL   GFR calc non Af Amer >60 >60 mL/min   GFR calc Af Amer >60 >60 mL/min    Comment: (NOTE) The eGFR has been calculated using the CKD EPI equation. This calculation has not been validated in all clinical situations. eGFR's persistently <60 mL/min signify possible Chronic Kidney Disease. Performed at [facility], [place] [address], [place], [place] [address]   Urine rapid drug screen  (hosp performed)     Status: None   Collection Time: 10/30/17 10:20 PM  Result Value Ref Range   Opiates NONE DETECTED NONE DETECTED   Cocaine NONE DETECTED NONE DETECTED   Benzodiazepines NONE DETECTED NONE DETECTED   Amphetamines NONE DETECTED NONE DETECTED   Tetrahydrocannabinol NONE DETECTED NONE DETECTED   Barbiturates NONE DETECTED NONE DETECTED    Comment: (NOTE) DRUG SCREEN FOR MEDICAL PURPOSES ONLY.  IF CONFIRMATION IS NEEDED FOR ANY PURPOSE, NOTIFY LAB WITHIN 5 DAYS. LOWEST DETECTABLE LIMITS FOR URINE DRUG SCREEN Drug Class                     Cutoff (ng/mL) Amphetamine and metabolites    1000 Barbiturate and metabolites    200 Benzodiazepine                 160 Tricyclics and metabolites     300 Opiates and metabolites        300 Cocaine and metabolites        300 THC                            50 Performed at [facility], [place] [address] [place], [place] [address]   Glucose, capillary     Status: None   Collection Time: 10/30/17 11:17 PM  Result Value Ref Range   Glucose-Capillary 76 65 - 99 mg/dL  Basic metabolic panel     Status: Abnormal   Collection Time: 10/31/17  5:04 AM  Result Value Ref Range   Sodium 139 135 - 145 mmol/L   Potassium 3.7 3.5 - 5.1 mmol/L   Chloride 106 101 - 111 mmol/L   CO2 24 22 - 32 mmol/L   Glucose, Bld 88 65 - 99 mg/dL   BUN 11 6 - 20 mg/dL   Creatinine, Ser 0.93 0.44 - 1.00 mg/dL   Calcium  8.6 (L) 8.9 - 10.3 mg/dL   GFR calc non Af Amer >60 >60 mL/min   GFR calc Af Amer >60 >60 mL/min    Comment: (NOTE) The eGFR has been calculated using the CKD EPI equation. This calculation has not been validated in all clinical situations. eGFR's persistently <60 mL/min signify possible Chronic Kidney Disease.    Anion gap 9 5 - 15    Comment: Performed at [facility] [address] [place], [place] [address]    Dg Chest 2 View  Result Date: 10/30/2017 CLINICAL DATA:  67 year old female with shortness of breath. EXAM:  CHEST - 2 VIEW COMPARISON:  Chest radiographs 07/17/2016 and earlier. FINDINGS: Lung volumes are stable and within normal limits. Mediastinal contours remain normal. Visualized tracheal air column is within normal limits. No pneumothorax, pulmonary edema, pleural effusion or confluent pulmonary opacity. No acute osseous abnormality identified. Negative visible bowel gas pattern. IMPRESSION: No acute cardiopulmonary abnormality. Electronically Signed   By: Genevie Ann M.D.   On: 10/30/2017 16:46   Ct Head Wo Contrast  Result Date: 10/30/2017 CLINICAL DATA:  Fall.  Weakness. EXAM: CT HEAD WITHOUT CONTRAST CT CERVICAL SPINE WITHOUT CONTRAST TECHNIQUE: Multidetector CT imaging of the head and cervical spine was performed following the standard protocol without intravenous contrast. Multiplanar CT image reconstructions of the cervical spine were also generated. COMPARISON:  10/03/2017 FINDINGS: CT HEAD FINDINGS Brain: No evidence of acute infarction, hemorrhage, hydrocephalus, or extra-axial collection. There is mild diffuse low-attenuation within the subcortical and periventricular white matter compatible with chronic microvascular disease. Again noted is a mass within the sella turcica and suprasellar cistern, image 33/6. Vascular: No hyperdense vessel or unexpected calcification. Skull: Normal. Negative for fracture or focal lesion. Sinuses/Orbits: Fluid levels identified within bilateral maxillary sinuses. Other: None CT CERVICAL SPINE FINDINGS Alignment: Straightening of normal cervical lordosis. Anterolisthesis of C4 on C5 identified. Skull base and vertebrae: No fractures identified. Soft tissues and spinal canal: No prevertebral fluid or swelling. No visible canal hematoma. Disc levels: Multi level disc space narrowing and ventral endplate spurring noted extending from C3-4 through C6-7. Upper chest: There is a large solid-appearing mass arising from the left lobe of thyroid gland and isthmus measuring 4.1 cm,  image 64/8. Other: None IMPRESSION: 1. No acute intracranial abnormalities. Chronic small vessel ischemic disease noted. 2. No evidence for cervical spine fracture or dislocation. Advanced cervical spondylosis noted. 3. 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 4. **An incidental finding of potential clinical significance has been found. 4.1 cm solid-appearing nodule within the thyroid gland is noted.** Consider further evaluation with thyroid ultrasound. If patient is clinically hyperthyroid, consider nuclear medicine thyroid uptake and scan. 5. Bilateral maxillary sinus fluid levels. Electronically Signed   By: Kerby Moors M.D.   On: 10/30/2017 16:40   Ct Cervical Spine Wo Contrast  Result Date: 10/30/2017 CLINICAL DATA:  Fall.  Weakness. EXAM: CT HEAD WITHOUT CONTRAST CT CERVICAL SPINE WITHOUT CONTRAST TECHNIQUE: Multidetector CT imaging of the head and cervical spine was performed following the standard protocol without intravenous contrast. Multiplanar CT image reconstructions of the cervical spine were also generated. COMPARISON:  10/03/2017 FINDINGS: CT HEAD FINDINGS Brain: No evidence of acute infarction, hemorrhage, hydrocephalus, or extra-axial collection. There is mild diffuse low-attenuation within the subcortical and periventricular white matter compatible with chronic microvascular disease. Again noted is a mass within the sella turcica and suprasellar cistern, image 33/6. Vascular: No hyperdense vessel or unexpected calcification. Skull: Normal. Negative  for fracture or focal lesion. Sinuses/Orbits: Fluid levels identified within bilateral maxillary sinuses. Other: None CT CERVICAL SPINE FINDINGS Alignment: Straightening of normal cervical lordosis. Anterolisthesis of C4 on C5 identified. Skull base and vertebrae: No fractures identified. Soft tissues and spinal canal: No prevertebral fluid or swelling. No visible canal hematoma. Disc levels: Multi level disc space  narrowing and ventral endplate spurring noted extending from C3-4 through C6-7. Upper chest: There is a large solid-appearing mass arising from the left lobe of thyroid gland and isthmus measuring 4.1 cm, image 64/8. Other: None IMPRESSION: 1. No acute intracranial abnormalities. Chronic small vessel ischemic disease noted. 2. No evidence for cervical spine fracture or dislocation. Advanced cervical spondylosis noted. 3. 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 4. **An incidental finding of potential clinical significance has been found. 4.1 cm solid-appearing nodule within the thyroid gland is noted.** Consider further evaluation with thyroid ultrasound. If patient is clinically hyperthyroid, consider nuclear medicine thyroid uptake and scan. 5. Bilateral maxillary sinus fluid levels. Electronically Signed   By: Kerby Moors M.D.   On: 10/30/2017 16:40    Impression/Plan   67 y.o. female with acute altered mental status yesterday that appears to have resolved since being hospitalized. She has a known history of pituitary adenoma as seen on MRI March 2019 - appears stable on CT scan from yesterday. While her mental status is improving, it is necessary from a NS standpoint to r/o pituitary apoplexy as cause for acute change in mental status. I have ordered MRI brain with pituitary protocol for this. Will hold on steroids presently due to improvement. If MRI shows pituitary apoplexy, will start hydrocortisone 161m and then 266mq am and 10 q pm. She would need surgery in the next several days should this be confirmed pituitary apoplexy. If normal MRI, no need for NS intervention acutely.   Continue current medical management per primary team.

## 2017-10-31 NOTE — Progress Notes (Signed)
   I saw and examined the patient. I reviewed the resident's note and I agree with the resident's findings and plan as documented in the resident's note.  Strange case of a 67 year old woman admitted with acute encephalopathy in the setting of a large pituitary adenoma.  Her encephalopathy is much improved today, she seems to have returned back to her baseline and only complaining of some persistent weakness.  I agree with plan for MRI brain today to rule out pituitary apoplexy.  Also awaiting PT and OT evaluations for the weakness to see if she needs more support at home.  Otherwise workup has ruled out metabolic derangements and systemic infections.    Axel Filler, MD 10/31/2017, 3:05 PM

## 2017-10-31 NOTE — Progress Notes (Signed)
Ultrasound wanted a clarification of the order for [place] of the Thyroid. MD resident on call was notified to clarify the order. Will continue to monitor.

## 2017-10-31 NOTE — Progress Notes (Signed)
PT Cancellation Note  Patient Details Name: Mariah [name] MRN: [id number] DOB: 1950/10/10   Cancelled Treatment:    Reason Eval/Treat Not Completed: Other (comment).  Pt was noted to be in pain, tired and did not feel like working with PT.  Will try later as time and pt allow.   Ramond Dial 10/31/2017, 2:15 PM   Mee Hives, PT MS Acute Rehab Dept. Number: [place] [facility] [place]

## 2017-10-31 NOTE — Progress Notes (Signed)
Subjective: Patient was resting comfortably in her bed today upon entering the room. She was alert, oriented, and able to converse freely without similar appearance to the previous day's evaluation.  Patient denied previously meeting BMPs evaluated her for admission.  She is not clear as to the series of events leading up to her presentation but states that while at home and at rest sitting in her chair she began to feel uneasy.  She was alert while falling to the floor denies loss of consciousness, stating that she had this "swimmy" feeling in her head.  She states that she often falls to the floor lying there until is she can comprehend a way to remove herself from the floor.  In times past she would call and individual in her apartment complex known as a key holder, who would assist her with getting back to her chair or on her feet. She did not know how she arrived to the hospital.   Objective:  Vital signs in last 24 hours: Vitals:   10/30/17 2321 10/31/17 0424 10/31/17 0747 10/31/17 0939  BP: (!) 143/65 123/61  (!) 145/71  Pulse: 91 91  92  Resp:  18    Temp:  98.8 F (37.1 C)    TempSrc:      SpO2:  95% 99%   Weight:      Height:       ROS negative except as per HPI.  Physical exam:  General: No acute distress,  Neuro: Alert and oriented x4: Conversant: No apparent focal deficits Eyes: PERRL, EOM intact Cardiovascular: RRR, there is a grade 2 systolic ejection murmur best heard at the right upper sternal border.  Pulmonary: Bilateral lungs clear to auscultation GI: Abdomen is soft, nontender, nondistended  Assessment/Plan:  Active Problems:   Hypertension associated with diabetes (HCC)   Asthma   Chronic venous stasis dermatitis of both lower extremities   Type 2 diabetes mellitus treated without insulin (HCC)   Acute encephalopathy   Encephalopathy acute  Assessment: This is a 67 yo F with a PMHx notable for HTN, daytime sleepiness, NAFLD, who presented with possible  fall and drowsiness. Given her acute encephalopathy and lack of family present at bedside it is unclear as to the series of events leading up to the current presentation. 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 It appears that she has begun and extensive outpatient evaluation without clear evidence as to the cause of her daytime sleepiness. Concern for a metabolic process is high. A call was placed to the emergency contact number without answer, we will attempt contact again in the morning. There is no clear indication that this is an infectious process given her symptoms nor if this is inflammatory in nature.   Plan: Acute encephalopathy: Resolved. Etiology, unclear at this time. Neurosurgery was called by patient for evaluation of her current condition given her admission for AMS.  She was seen and evaluated by them this am at her request.  Given her new acute findings they determined that an MRI of the brain would be appropriate for evaluation of her sellar mass to consider with her May 30 surgical date is appropriate or if a sooner date is indicated. We do not disagree that an MRI of the brain may be appropriate given her history but as her symptoms have resolved I do not feel this will demonstrate a significant change from her prior scan. However, given that  the etiology is uncertain, MRI is warranted in this setting. --MRI brain  Insulin independent diabetes: Patient on glipizide edema at home.  Continue holding her home anti-glycemic medications and monitor her CBG BID  Hypokalemia:  Potassium 3.4 admission treated with p.o. 40 mg.  Potassium this a.m. 3.7. -BMP in a.m. to monitor  Hypertension: BP mildly elevated at 145/71. Continue Amlodipine 10 mg daily   Diet: Heart healthy  Code: Full Fluids: N/A DVT P PX: Lovenox Dispo: Anticipated discharge in approximately 1-2 day(s).   Kathi Ludwig, MD 10/31/2017, 11:57  AM Pager: Pager# [phone]

## 2017-11-01 ENCOUNTER — Inpatient Hospital Stay (HOSPITAL_COMMUNITY): Payer: Medicare Other

## 2017-11-01 DIAGNOSIS — E669 Obesity, unspecified: Secondary | ICD-10-CM

## 2017-11-01 DIAGNOSIS — M25562 Pain in left knee: Secondary | ICD-10-CM

## 2017-11-01 DIAGNOSIS — Z9181 History of falling: Secondary | ICD-10-CM

## 2017-11-01 DIAGNOSIS — M25561 Pain in right knee: Secondary | ICD-10-CM

## 2017-11-01 DIAGNOSIS — I872 Venous insufficiency (chronic) (peripheral): Secondary | ICD-10-CM

## 2017-11-01 DIAGNOSIS — G8929 Other chronic pain: Secondary | ICD-10-CM

## 2017-11-01 DIAGNOSIS — E1142 Type 2 diabetes mellitus with diabetic polyneuropathy: Secondary | ICD-10-CM

## 2017-11-01 LAB — BASIC METABOLIC PANEL
Anion gap: 10 (ref 5–15)
BUN: 10 mg/dL (ref 6–20)
CO2: 24 mmol/L (ref 22–32)
CREATININE: 0.89 mg/dL (ref 0.44–1.00)
Calcium: 8.4 mg/dL — ABNORMAL LOW (ref 8.9–10.3)
Chloride: 103 mmol/L (ref 101–111)
GFR calc Af Amer: >60 mL/min (ref >60)
GFR calc non Af Amer: >60 mL/min (ref >60)
GLUCOSE: 124 mg/dL — AB (ref 65–99)
Potassium: 3.5 mmol/L (ref 3.5–5.1)
Sodium: 137 mmol/L (ref 135–145)

## 2017-11-01 LAB — INSULIN-LIKE GROWTH FACTOR: Somatomedin C: 96 ng/mL (ref 42–169)

## 2017-11-01 LAB — GLUCOSE, CAPILLARY
GLUCOSE-CAPILLARY: 166 mg/dL — AB (ref 65–99)
Glucose-Capillary: 118 mg/dL — ABNORMAL HIGH (ref 65–99)

## 2017-11-01 LAB — LUTEINIZING HORMONE: LH: 9.3 m[IU]/mL

## 2017-11-01 LAB — PROLACTIN: Prolactin: 29.6 ng/mL — ABNORMAL HIGH (ref 4.8–23.3)

## 2017-11-01 LAB — FOLLICLE STIMULATING HORMONE: FSH: 8.3 m[IU]/mL

## 2017-11-01 MED ORDER — LISINOPRIL 20 MG PO TABS
20.0000 mg | ORAL_TABLET | Freq: Two times a day (BID) | ORAL | Status: DC
  Administered 2017-11-01 – 2017-11-02 (×3): 20 mg via ORAL
  Filled 2017-11-01 (×3): qty 1

## 2017-11-01 MED ORDER — GADOBENATE DIMEGLUMINE 529 MG/ML IV SOLN
10.0000 mL | Freq: Once | INTRAVENOUS | Status: AC | PRN
  Administered 2017-11-01: 10 mL via INTRAVENOUS

## 2017-11-01 NOTE — Progress Notes (Signed)
  Request seen for [place] core biopsy of thyroid nodule.  Recommend FNA biopsy with Afirma as outpatient.  Ordering provider made aware.  Aitanna Haubner S Ladeja Pelham PA-C 11/01/2017 8:13 AM

## 2017-11-01 NOTE — Progress Notes (Signed)
   I saw and examined the patient. I reviewed the resident's note and I agree with the resident's findings and plan as documented in the resident's note.  Doing well today. Encephalopathy remains resolved with no recurrence since admission. We are awaiting MRI brain to rule out apoplexy of the known pituitary adenoma. Patient has a chronic weakness due to chronic LE edema and stasis changes, peripheral neuropathy, and obesity. It does not sound like she is doing well at home, she is having frequent falls, relies on neighbors for assistance regularly, and our therapists are recommending SNF because she is needing moderate assist with simple transfers. But, the patient is declining SNF placement, wants to go back home to her apartment. She does have capacity to make this decision.   Axel Filler, MD 11/01/2017, 4:07 PM

## 2017-11-01 NOTE — Discharge Summary (Signed)
Name: Mariah [name] MRN: [id number] DOB: 1951/02/12 67 y.o. PCP: [name], MD  Date of Admission: 10/30/2017 12:37 PM Date of Discharge: 11/02/2017 Attending Physician: Dr. Evette Doffing  Discharge Diagnosis: Active Problems:   Hypertension associated with diabetes ([place])   Asthma   Chronic venous stasis dermatitis of both lower extremities   Type 2 diabetes mellitus treated without insulin ([place])   Acute encephalopathy   Encephalopathy acute   Discharge Medications: Allergies as of 11/02/2017      Reactions   Codeine Nausea And Vomiting   Doxycycline Itching, Nausea And Vomiting   Indomethacin Diarrhea   Penicillins Hives      Medication List    STOP taking these medications   diphenhydrAMINE-zinc acetate cream Commonly known as:  BENADRYL EXTRA STRENGTH     TAKE these medications   albuterol 108 (90 Base) MCG/ACT inhaler Commonly known as:  PROVENTIL HFA;VENTOLIN HFA Inhale 1-2 puffs into the lungs every 6 (six) hours as needed for wheezing or shortness of breath.   amLODipine 10 MG tablet Commonly known as:  NORVASC Take 1 tablet (10 mg total) by mouth daily. What changed:    how much to take  when to take this   esomeprazole 20 MG capsule Commonly known as:  NEXIUM Take 1 capsule (20 mg total) by mouth every morning. For acid reflex   Fluticasone-Salmeterol 100-50 MCG/DOSE Aepb Commonly known as:  ADVAIR DISKUS Inhale 1 puff into the lungs 2 (two) times daily.   furosemide 40 MG tablet Commonly known as:  LASIX Take 40 mg by mouth.   glipiZIDE 10 MG 24 hr tablet Commonly known as:  GLUCOTROL XL Take 2 tablets (20 mg total) by mouth daily with breakfast.   lisinopril 10 MG tablet Commonly known as:  PRINIVIL,ZESTRIL Take 20 mg by mouth 2 (two) times daily. What changed:  Another medication with the same name was removed. Continue taking this medication, and follow the directions you see here.   mometasone 50 MCG/ACT nasal spray Commonly known as:   NASONEX Place 1 spray into the nose as needed.   naproxen 500 MG tablet Commonly known as:  NAPROSYN Take 1 tablet (500 mg total) by mouth 2 (two) times daily with a meal.   sitaGLIPtin-metformin 50-1000 MG tablet Commonly known as:  JANUMET Take 1 tablet by mouth 2 (two) times daily with a meal.       Disposition and follow-up:   [name] was discharged from [facility] in Stable condition.  At the hospital follow up visit please address:  1.  Please continue to address the patients daytime drowsiness      Please also consider the patients need for surgical removal of the sella turcica mass on 5/31.      Please address the chronically elevated WBC count      Hypokalemia: please assess  2.  Labs / imaging needed at time of follow-up: CBC (chronically elevated), BMP (potassium)  3.  Pending labs/ test needing follow-up: Blood cultures  Follow-up Appointments: Contact information for after-discharge care    Destination    [facility] .   Service:  Skilled Nursing Contact information: [address] [place] [facility] Course by problem list: Active Problems:   Hypertension associated with diabetes ([place])   Asthma   Chronic venous stasis dermatitis of both lower extremities   Type 2 diabetes  mellitus treated without insulin ([place])   Acute encephalopathy   Encephalopathy acute   Acute Encephalopathy  Mariah [name] is a 67 year old female with a past medical history notable for hypertension, daytime sleepiness, and nonalcoholic fatty liver disease, who presented with a possible fall from her chair and drowsiness.  Initial concern for infectious, metabolic, psychiatric or vasculitic etiologies for acute encephalopathy were noted upon the initial evaluation. She has a notable stable mass within the sella turcica as observed on CT.  Patient had no notable metabolic or  hematologic derangements on  Admission of significance.  Her symptoms resolved spontaneously the following morning.  She self consulted neurosurgery given her admission. They had a low level of suspicion for apoplexy and as such ordered and MRI which failed to demonstrate an acute intracranial process with a stable mass in the sella turcica. She was to keep her scheduled appointment for surgery on May 31st as a result. Given her level of weakness at baseline in conjunction with her deconditioning following a brief hospitalization SNF was recommended to which she was in agreement by day three.   Discharge Vitals:   BP (!) 145/71   Pulse 93   Temp 98.8 F (37.1 C) (Oral)   Resp 18   Ht 5\' 1"  (1.549 m)   Wt 232 lb (105.2 kg)   LMP 04/08/2013   SpO2 97%   BMI 43.84 kg/m   Pertinent Labs, Studies, and Procedures:  CMP Latest Ref Rng & Units 11/01/2017 10/31/2017 10/30/2017  Glucose 65 - 99 mg/dL 124(H) 88 -  BUN 6 - 20 mg/dL 10 11 -  Creatinine 0.44 - 1.00 mg/dL 0.89 0.93 0.84  Sodium 135 - 145 mmol/L 137 139 -  Potassium 3.5 - 5.1 mmol/L 3.5 3.7 -  Chloride 101 - 111 mmol/L 103 106 -  CO2 22 - 32 mmol/L 24 24 -  Calcium 8.9 - 10.3 mg/dL 8.4(L) 8.6(L) -  Total Protein 6.5 - 8.1 g/dL - - -  Total Bilirubin 0.3 - 1.2 mg/dL - - -  Alkaline Phos 38 - 126 U/L - - -  AST 15 - 41 U/L - - -  ALT 14 - 54 U/L - - -   CBC Latest Ref Rng & Units 10/30/2017 09/01/2017 02/06/2016  WBC 4.0 - 10.5 K/uL 13.8(H) 11.6(H) 14.6(H)  Hemoglobin 12.0 - 15.0 g/dL 11.9(L) 12.1 12.6  Hematocrit 36.0 - 46.0 % 37.8 38.0 37.5  Platelets 150 - 400 K/uL 340 429(H) 416(H)   Blood cultures negative x 3 days.  MRI Brain: IMPRESSION: 1. No acute intracranial process. 2. 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 No apoplexy. 3. 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  Discharge Instructions: Discharge Instructions    Call MD for:  persistant  dizziness or light-headedness   Complete by:  As directed    Diet - low sodium heart healthy   Complete by:  As directed    Increase activity slowly   Complete by:  As directed      Signed: Kathi Ludwig, MD 11/02/2017, 11:26 AM   Pager: Pager# [phone]

## 2017-11-01 NOTE — Evaluation (Signed)
Physical Therapy Evaluation Patient Details Name: Mariah [name] MRN: [id number] DOB: 1950-08-28 Today's Date: 11/01/2017   History of Present Illness  Pt is a 67 y.o. female with PMH significnat for HTN, GERD, diabetes mellitus type 2, morbid obesity, chonic venous stasis dermatitis, and asthma who presented to the ED following fall out of a chair on to her L side. Pt admitted with altered mental status. Pt with known pituitary adenoma and being followed by neurosurgery for this. Awaiting repeat MRI.  Clinical Impression  Patient presents with generalized weakness, confusion- impaired attention, confabulation, decreased awareness of safety/deficits, impaired balance and impaired mobility s/p above. Tolerated SPT x2 with mod A for support. Pt with weakness in BLEs and difficulty progressing them during mobility. Unable to ambulate at this time. Pt not safe to be home alone at this time due to current functional/cognitive status. Not sure of accuracy of pt's PLOF/home setup as pt not forthcoming with information and info not making sense. Supposed to be awaiting power w/c. Would benefit from SNF to maximize independence and mobility prior to return home. Will follow acutely.     Follow Up Recommendations SNF;Supervision for mobility/OOB;Supervision/Assistance - 24 hour    Equipment Recommendations  Wheelchair (measurements PT);Wheelchair cushion (measurements PT)    Recommendations for Other Services OT consult     Precautions / Restrictions Precautions Precautions: Fall Precaution Comments: Very unstable; decreased awareness of deficits.  Restrictions Weight Bearing Restrictions: No      Mobility  Bed Mobility Overal bed mobility: Needs Assistance Bed Mobility: Supine to Sit     Supine to sit: Min guard     General bed mobility comments: Up in chair upon PT arrival.   Transfers Overall transfer level: Needs assistance Equipment used: Rolling walker (2 wheeled) Transfers: Sit  to/from [facility] Sit to Stand: Mod assist Stand pivot transfers: Mod assist       General transfer comment: Mod assist to power up and maintain upright. Incontinent of urine upon standing. SPT to chair with Mod A for support to guide bottom to chair. SPT chair to/from [facility]. Difficulty clearing BLEs despite cues. Increased time.  Ambulation/Gait             General Gait Details: Unable  Stairs            Wheelchair Mobility    Modified Rankin (Stroke Patients Only)       Balance Overall balance assessment: Needs assistance Sitting-balance support: Feet supported;No upper extremity supported Sitting balance-Leahy Scale: Fair Sitting balance - Comments: Performed pericare in sitting without difficulty.   Standing balance support: During functional activity;Bilateral upper extremity supported Standing balance-Leahy Scale: Poor Standing balance comment: Relies on UE support and external assistance.                              Pertinent Vitals/Pain Pain Assessment: Faces Faces Pain Scale: Hurts even more Pain Location: L knee Pain Descriptors / Indicators: Discomfort;Grimacing;Sore Pain Intervention(s): Monitored during session;Limited activity within patient's tolerance;Repositioned    Home Living Family/patient expects to be discharged to:: Private residence Living Arrangements: Alone Available Help at Discharge: Neighbor;Available PRN/intermittently Type of Home: Apartment Home Access: (pt alludes to level entry)     Home Layout: One level Home Equipment: None Additional Comments: Pt reports that when she fell she laid on the floor until morning. She has neighbors who assist her intermittently.     Prior Function Level of Independence: Independent  Comments: Reported independence with functional mobility and driving. Her neighbors assist with LB dressing tasks. Reports no falls.     Hand Dominance         Extremity/Trunk Assessment   Upper Extremity Assessment Upper Extremity Assessment: Defer to OT evaluation    Lower Extremity Assessment Lower Extremity Assessment: Generalized weakness;LLE deficits/detail;RLE deficits/detail;Difficult to assess due to impaired cognition RLE Deficits / Details: Grossly ~3/5 knee extension, declines trying hip flexion, "I cannot" ankle AROM WFL. RLE Sensation: WNL LLE Deficits / Details: Grossly ~3/5 knee extension, declines trying hip flexion, "I cannot" ankle AROM WFL. LLE Sensation: WNL       Communication   Communication: No difficulties  Cognition Arousal/Alertness: Awake/alert Behavior During Therapy: WFL for tasks assessed/performed Overall Cognitive Status: No family/caregiver present to determine baseline cognitive functioning Area of Impairment: Attention;Safety/judgement;Awareness                   Current Attention Level: Selective     Safety/Judgement: Decreased awareness of safety;Decreased awareness of deficits Awareness: Intellectual   General Comments: Pt reporting inconsistencies with her PLOF status. Confabulating throughout session. nothing seems to make sense-seems to be mixing past with present while relating her current abilities. Perseverating on certain topics throughout. No awareness of current deficits. "Want me to walk down the hall later with this thing?" despite but being able to transfer without assist.       General Comments      Exercises     Assessment/Plan    PT Assessment Patient needs continued PT services  PT Problem List Decreased strength;Decreased mobility;Decreased safety awareness;Obesity;Decreased activity tolerance;Decreased range of motion;Decreased cognition;Pain;Decreased balance;Decreased knowledge of use of DME       PT Treatment Interventions Functional mobility training;Balance training;Patient/family education;Gait training;Therapeutic activities;Therapeutic exercise;Wheelchair  mobility training;DME instruction;Cognitive remediation    PT Goals (Current goals can be found in the Care Plan section)  Acute Rehab PT Goals Patient Stated Goal: go home PT Goal Formulation: With patient Time For Goal Achievement: 11/15/17 Potential to Achieve Goals: Good    Frequency Min 2X/week   Barriers to discharge Decreased caregiver support lives alone, not sure about access to apt as pt does not answer this question    Co-evaluation               AM-PAC PT "6 Clicks" Daily Activity  Outcome Measure Difficulty turning over in bed (including adjusting bedclothes, sheets and blankets)?: None Difficulty moving from lying on back to sitting on the side of the bed? : Unable Difficulty sitting down on and standing up from a chair with arms (e.g., wheelchair, bedside commode, etc,.)?: Unable Help needed moving to and from a bed to chair (including a wheelchair)?: A Lot Help needed walking in hospital room?: A Lot Help needed climbing 3-5 steps with a railing? : Total 6 Click Score: 11    End of Session Equipment Utilized During Treatment: Gait belt Activity Tolerance: Patient tolerated treatment well Patient left: in chair;with call bell/phone within reach;with chair alarm set;with nursing/sitter in room Nurse Communication: Mobility status PT Visit Diagnosis: Muscle weakness (generalized) (M62.81);Difficulty in walking, not elsewhere classified (R26.2);Unsteadiness on feet (R26.81);Pain Pain - Right/Left: Left Pain - part of body: Knee    Time: 1350-1414 PT Time Calculation (min) (ACUTE ONLY): 24 min   Charges:   PT Evaluation $PT Eval Moderate Complexity: 1 Mod PT Treatments $Therapeutic Activity: 8-22 mins   PT G Codes:        Wray Kearns, PT, DPT [phone]  Marguarite Arbour A Bodhi Stenglein 11/01/2017, 2:32 PM

## 2017-11-01 NOTE — Evaluation (Signed)
Occupational Therapy Evaluation Patient Details Name: Mariah [name] MRN: [id number] DOB: 05-Dec-1950 Today's Date: 11/01/2017    History of Present Illness Pt is a 67 y.o. female with PMH significnat for HTN, GERD, diabetes mellitus type 2, morbid obesity, chonic venous stasis dermatitis, and asthma who presented to the ED following fall out of a chair on to her L side. Pt admitted with altered mental status. Pt with known pituitary adenoma and being followed by neurosurgery for this. Awaiting repeat MRI.   Clinical Impression   PTA, pt reports independence with functional mobility and ADL except for LB dressing to don/doff socks. Pt reports having multiple neighbors nearby who are available to assist if needed although she does live alone. Pt presents to session with decreased problem solving skills, decreased awareness, and decreased understanding of her deficits. She requires max assist for toileting hygiene and mod assist for toilet transfers. Feel that pt would best benefit from SNF level rehabilitation post-acute D/C due to current functional limitations in order to maximize return to PLOF prior to D/C home. OT will continue to follow while admitted.    Follow Up Recommendations  SNF;Supervision/Assistance - 24 hour    Equipment Recommendations  Other (comment)(defer to next venue of care)    Recommendations for Other Services       Precautions / Restrictions Precautions Precautions: Fall Precaution Comments: Very unstable; decreased awareness of deficits.  Restrictions Weight Bearing Restrictions: No      Mobility Bed Mobility Overal bed mobility: Needs Assistance Bed Mobility: Supine to Sit     Supine to sit: Min guard     General bed mobility comments: Guarding for safety and significantly increased time.   Transfers Overall transfer level: Needs assistance Equipment used: Rolling walker (2 wheeled) Transfers: Sit to/from [facility] Sit to Stand:  Mod assist Stand pivot transfers: Mod assist       General transfer comment: Mod assist to power up and maintain upright during stand-pivot transfer.     Balance Overall balance assessment: Needs assistance Sitting-balance support: No upper extremity supported;Feet supported Sitting balance-Leahy Scale: Fair     Standing balance support: Bilateral upper extremity supported;During functional activity;Single extremity supported Standing balance-Leahy Scale: Poor Standing balance comment: Relies on UE support and external assistance.                            ADL either performed or assessed with clinical judgement   ADL Overall ADL's : Needs assistance/impaired Eating/Feeding: Set up;Sitting   Grooming: Set up;Sitting   Upper Body Bathing: Minimal assistance;Sitting   Lower Body Bathing: Maximal assistance;Sit to/from stand   Upper Body Dressing : Minimal assistance;Sitting   Lower Body Dressing: Maximal assistance;Sit to/from stand   Toilet Transfer: Moderate assistance;Stand-pivot;RW   Toileting- Clothing Manipulation and Hygiene: Maximal assistance;Sit to/from stand       Functional mobility during ADLs: Moderate assistance;Rolling walker(stand-pivot only ) General ADL Comments: Pt demonstrating limited occupational participation due to cognitive and physical deficits.      Vision Patient Visual Report: No change from baseline Additional Comments: No deficits noted but will continue to assess.      Perception     Praxis      Pertinent Vitals/Pain Pain Assessment: Faces Faces Pain Scale: Hurts even more Pain Location: L knee Pain Descriptors / Indicators: Discomfort;Grimacing;Sore Pain Intervention(s): Limited activity within patient's tolerance;Monitored during session;Repositioned     Hand Dominance     Extremity/Trunk Assessment Upper  Extremity Assessment Upper Extremity Assessment: Generalized weakness   Lower Extremity  Assessment Lower Extremity Assessment: Generalized weakness       Communication Communication Communication: No difficulties   Cognition Arousal/Alertness: Awake/alert Behavior During Therapy: WFL for tasks assessed/performed Overall Cognitive Status: No family/caregiver present to determine baseline cognitive functioning Area of Impairment: Attention;Safety/judgement;Awareness                   Current Attention Level: Selective     Safety/Judgement: Decreased awareness of safety;Decreased awareness of deficits Awareness: Intellectual   General Comments: Pt reporting inconsistencies with her PLOF status. She seems to be mixing past with present while relating her current abilities. Perseverating on certain topics throughout.    General Comments       Exercises     Shoulder Instructions      Home Living Family/patient expects to be discharged to:: Private residence Living Arrangements: Alone Available Help at Discharge: Neighbor Type of Home: Apartment Home Access: (pt alludes to level entry)     Home Layout: One level     Bathroom Shower/Tub: Tub/shower unit             Additional Comments: Pt reports that when she fell she laid on the floor until morning. She has neighbors who assist her intermittently.       Prior Functioning/Environment Level of Independence: Independent        Comments: Reported independence with functional mobility and driving. Her neighbors assist with LB dressing tasks.         OT Problem List: Decreased strength;Decreased range of motion;Decreased activity tolerance;Impaired balance (sitting and/or standing);Decreased safety awareness;Decreased knowledge of use of DME or AE;Decreased knowledge of precautions;Pain;Decreased cognition      OT Treatment/Interventions: Self-care/ADL training;Therapeutic exercise;Energy conservation;DME and/or AE instruction;Therapeutic activities;Patient/family education;Balance  training;Cognitive remediation/compensation;Visual/perceptual remediation/compensation    OT Goals(Current goals can be found in the care plan section) Acute Rehab OT Goals Patient Stated Goal: go home OT Goal Formulation: With patient Time For Goal Achievement: 11/15/17 Potential to Achieve Goals: Good ADL Goals Pt Will Perform Grooming: with min guard assist;standing Pt Will Transfer to Toilet: ambulating;bedside commode;with min guard assist(BSC over toilet) Pt Will Perform Toileting - Clothing Manipulation and hygiene: with modified independence;sitting/lateral leans Pt/caregiver will Perform Home Exercise Program: Both right and left upper extremity;With Supervision;With written HEP provided Additional ADL Goal #1: Pt will demonstrate anticipatory awareness during seated ADL tasks in a moderately distracting environment.  OT Frequency: Min 2X/week   Barriers to D/C:            Co-evaluation              AM-PAC PT "6 Clicks" Daily Activity     Outcome Measure Help from another person eating meals?: None Help from another person taking care of personal grooming?: A Little Help from another person toileting, which includes using toliet, bedpan, or urinal?: A Lot Help from another person bathing (including washing, rinsing, drying)?: A Lot Help from another person to put on and taking off regular upper body clothing?: A Little Help from another person to put on and taking off regular lower body clothing?: A Lot 6 Click Score: 16   End of Session Equipment Utilized During Treatment: Gait belt;Rolling walker Nurse Communication: Mobility status  Activity Tolerance: Patient tolerated treatment well Patient left: in chair;with call bell/phone within reach;with chair alarm set  OT Visit Diagnosis: Unsteadiness on feet (R26.81);Muscle weakness (generalized) (M62.81)  Time: 8127-5170 OT Time Calculation (min): 52 min Charges:  OT General Charges $OT Visit: 1  Visit OT Evaluation $OT Eval Moderate Complexity: 1 Mod OT Treatments $Self Care/Home Management : 23-37 mins G-Codes:     Norman Herrlich, MS OTR/L  Pager: [place] A Mariah [name] 11/01/2017, 12:46 PM

## 2017-11-01 NOTE — Progress Notes (Signed)
Subjective: The patient was resting in her room today upon entering the room. She denied pain other than chronic pain in her knees. She is willing to be discharged home following MRI and PT evaluation. I suspect she may need SNF placement but she does not appear to be willing to participate with this recommendation.   Patient permitted the medical team to contact her neighbor who assisted her off of the floor and called EMS when she was found on the floor. Patients neighbor Beverlee Nims stated that she has been falling frequently as her bed is too low for her to get out of just been sleeping in a new recliner which does not easily facilitate such activity.  Given this history and current disposition I am concerned that she will need skilled nursing facility placement which she is not currently amenable to.  We will continue to discuss this with with her following a physical therapy evaluation and make our recommendations once this is completed.  Objective:  Vital signs in last 24 hours: Vitals:   10/31/17 1437 10/31/17 2150 10/31/17 2216 11/01/17 0546  BP: (!) 153/75  (!) 142/67 (!) 155/79  Pulse: 76 (!) 101 (!) 102 97  Resp: 20 17 20 18   Temp: 97.8 F (36.6 C)  100 F (37.8 C) 99.4 F (37.4 C)  TempSrc: Oral     SpO2: 99% 97% 100% 96%  Weight:      Height:       ROS negative except as per HPI.  Physical Exam: General: No acute distress, afebrile, nondiaphoretic, conversant Neuro: EOM intact, PERRLA, no acute focal deficits observed Prevascular: RRR, there is a grade 2 systolic ejection murmur best heard at the right sternal border. Pulmonary: Lungs clear to AUSCULTATION bilaterally GI: Abdomen soft, nontender, nondistended Musculoskeletal: Bilateral lower extremity's continued to demonstrate marked venous stasis skin manifestations  Assessment/Plan:  Active Problems:   Hypertension associated with diabetes (HCC)   Asthma   Chronic venous stasis dermatitis of both lower extremities   Type 2 diabetes mellitus treated without insulin (HCC)   Acute encephalopathy   Encephalopathy acute  Assessment: This is a 67 year old female with a past medical history notable for hypertension, daytime sleepiness, and nonalcoholic fatty liver disease, who presented with a possible fall from her chair and drowsiness.  Initial concern for infectious versus metabolic versus vasculitic etiologies for acute encephalopathy were noted evaluation negative.  She has a notable stable mass within the sella turcica is observed on CT.  Patient had no notable metabolic or or hematologic derangements on admission.  Her symptoms resolved spontaneously the following morning.  She self consulted neurosurgery who had initially scheduled her for surgery on May 30th.  They requested an MRI of the brain to evaluate the sella turcica mass for acute changes.  Occupational therapy recommended skilled nursing facility placement which she is on amenable to currently.  Plan: Acute cephalopathy: Resolved.  Etiology remains unclear at this time.  I suspect this is possibly due to increased daytime somnolence from a disrupted sleep wake cycle, sleeping was sitting up in a chair not If sleeping, with possible  self-medication is an issue.  She is currently slated for MRI to evaluate a mass within the sella turcica. She appears stable for discharge following completion of her MRI of the brain and evaluation by physical therapy.   Insulin dependent diabetes: Patient on glipizide and Janumet. Both were held during his admission last glucose at 118.  We will continue medications upon discharge.  Hypokalemia:  Resolved.  Potassium 3.4 admission daily with p.o. potassium potassium 3.8 on 11/01/2017.  Hypertension: BP mildly elevated at 155/79. --Continue home amlodipine 10 mg daily --Initiated Lisinopril 20mg  BID  Diet: heart Healthy Code: Full Fluids: N/A DVT ppx: Lovenox Dispo: Anticipated discharge in approximately 0-1  day(s).   Kathi Ludwig, MD 11/01/2017, 7:01 AM Pager: Pager# [phone]

## 2017-11-02 ENCOUNTER — Ambulatory Visit: Payer: Medicaid Other

## 2017-11-02 DIAGNOSIS — R2681 Unsteadiness on feet: Secondary | ICD-10-CM | POA: Diagnosis not present

## 2017-11-02 DIAGNOSIS — Z881 Allergy status to other antibiotic agents status: Secondary | ICD-10-CM

## 2017-11-02 DIAGNOSIS — E041 Nontoxic single thyroid nodule: Secondary | ICD-10-CM | POA: Diagnosis not present

## 2017-11-02 DIAGNOSIS — G8929 Other chronic pain: Secondary | ICD-10-CM | POA: Diagnosis not present

## 2017-11-02 DIAGNOSIS — Z888 Allergy status to other drugs, medicaments and biological substances status: Secondary | ICD-10-CM

## 2017-11-02 DIAGNOSIS — R531 Weakness: Secondary | ICD-10-CM | POA: Diagnosis not present

## 2017-11-02 DIAGNOSIS — R404 Transient alteration of awareness: Secondary | ICD-10-CM | POA: Diagnosis not present

## 2017-11-02 DIAGNOSIS — R4182 Altered mental status, unspecified: Secondary | ICD-10-CM | POA: Diagnosis not present

## 2017-11-02 DIAGNOSIS — E876 Hypokalemia: Secondary | ICD-10-CM | POA: Diagnosis not present

## 2017-11-02 DIAGNOSIS — K760 Fatty (change of) liver, not elsewhere classified: Secondary | ICD-10-CM | POA: Diagnosis not present

## 2017-11-02 DIAGNOSIS — E161 Other hypoglycemia: Secondary | ICD-10-CM | POA: Diagnosis not present

## 2017-11-02 DIAGNOSIS — W1830XA Fall on same level, unspecified, initial encounter: Secondary | ICD-10-CM | POA: Diagnosis not present

## 2017-11-02 DIAGNOSIS — D352 Benign neoplasm of pituitary gland: Secondary | ICD-10-CM | POA: Diagnosis not present

## 2017-11-02 DIAGNOSIS — J45909 Unspecified asthma, uncomplicated: Secondary | ICD-10-CM | POA: Diagnosis not present

## 2017-11-02 DIAGNOSIS — I10 Essential (primary) hypertension: Secondary | ICD-10-CM | POA: Diagnosis not present

## 2017-11-02 DIAGNOSIS — G9341 Metabolic encephalopathy: Secondary | ICD-10-CM | POA: Diagnosis not present

## 2017-11-02 DIAGNOSIS — E10649 Type 1 diabetes mellitus with hypoglycemia without coma: Secondary | ICD-10-CM | POA: Diagnosis not present

## 2017-11-02 DIAGNOSIS — M6281 Muscle weakness (generalized): Secondary | ICD-10-CM | POA: Diagnosis not present

## 2017-11-02 DIAGNOSIS — R930 Abnormal findings on diagnostic imaging of skull and head, not elsewhere classified: Secondary | ICD-10-CM | POA: Diagnosis not present

## 2017-11-02 DIAGNOSIS — Z7984 Long term (current) use of oral hypoglycemic drugs: Secondary | ICD-10-CM | POA: Diagnosis not present

## 2017-11-02 DIAGNOSIS — D72829 Elevated white blood cell count, unspecified: Secondary | ICD-10-CM | POA: Diagnosis not present

## 2017-11-02 DIAGNOSIS — M25562 Pain in left knee: Secondary | ICD-10-CM | POA: Diagnosis not present

## 2017-11-02 DIAGNOSIS — I872 Venous insufficiency (chronic) (peripheral): Secondary | ICD-10-CM | POA: Diagnosis not present

## 2017-11-02 DIAGNOSIS — R488 Other symbolic dysfunctions: Secondary | ICD-10-CM | POA: Diagnosis not present

## 2017-11-02 DIAGNOSIS — E11649 Type 2 diabetes mellitus with hypoglycemia without coma: Secondary | ICD-10-CM | POA: Diagnosis not present

## 2017-11-02 DIAGNOSIS — E119 Type 2 diabetes mellitus without complications: Secondary | ICD-10-CM | POA: Diagnosis not present

## 2017-11-02 DIAGNOSIS — K219 Gastro-esophageal reflux disease without esophagitis: Secondary | ICD-10-CM | POA: Diagnosis not present

## 2017-11-02 DIAGNOSIS — Z23 Encounter for immunization: Secondary | ICD-10-CM | POA: Diagnosis not present

## 2017-11-02 DIAGNOSIS — G930 Cerebral cysts: Secondary | ICD-10-CM | POA: Diagnosis not present

## 2017-11-02 DIAGNOSIS — E1159 Type 2 diabetes mellitus with other circulatory complications: Secondary | ICD-10-CM | POA: Diagnosis not present

## 2017-11-02 DIAGNOSIS — N390 Urinary tract infection, site not specified: Secondary | ICD-10-CM | POA: Diagnosis not present

## 2017-11-02 DIAGNOSIS — Z880 Allergy status to penicillin: Secondary | ICD-10-CM

## 2017-11-02 DIAGNOSIS — R011 Cardiac murmur, unspecified: Secondary | ICD-10-CM | POA: Diagnosis not present

## 2017-11-02 DIAGNOSIS — M25561 Pain in right knee: Secondary | ICD-10-CM | POA: Diagnosis not present

## 2017-11-02 DIAGNOSIS — N939 Abnormal uterine and vaginal bleeding, unspecified: Secondary | ICD-10-CM | POA: Diagnosis not present

## 2017-11-02 DIAGNOSIS — Z79899 Other long term (current) drug therapy: Secondary | ICD-10-CM | POA: Diagnosis not present

## 2017-11-02 DIAGNOSIS — G9340 Encephalopathy, unspecified: Secondary | ICD-10-CM | POA: Diagnosis not present

## 2017-11-02 DIAGNOSIS — Z885 Allergy status to narcotic agent status: Secondary | ICD-10-CM

## 2017-11-02 DIAGNOSIS — R400 Somnolence: Secondary | ICD-10-CM | POA: Diagnosis not present

## 2017-11-02 DIAGNOSIS — E162 Hypoglycemia, unspecified: Secondary | ICD-10-CM | POA: Diagnosis not present

## 2017-11-02 DIAGNOSIS — R5381 Other malaise: Secondary | ICD-10-CM | POA: Diagnosis not present

## 2017-11-02 LAB — GLUCOSE, CAPILLARY
GLUCOSE-CAPILLARY: 145 mg/dL — AB (ref 65–99)
GLUCOSE-CAPILLARY: 147 mg/dL — AB (ref 65–99)
Glucose-Capillary: 131 mg/dL — ABNORMAL HIGH (ref 65–99)
Glucose-Capillary: 153 mg/dL — ABNORMAL HIGH (ref 65–99)

## 2017-11-02 MED ORDER — FLUTICASONE-SALMETEROL 100-50 MCG/DOSE IN AEPB: 1.0000 | INHALATION_SPRAY | Freq: Two times a day (BID) | RESPIRATORY_TRACT | 0 refills | Status: DC

## 2017-11-02 NOTE — Clinical Social Work Placement (Signed)
   CLINICAL SOCIAL WORK PLACEMENT  NOTE  Date:  11/02/2017  Patient Details  Name: Mariah [name] MRN: [id number] Date of Birth: [birth year]  Clinical Social Work is seeking post-discharge placement for this patient at the [place] level of care (*CSW will initial, date and re-position this form in  chart as items are completed):  Yes   Patient/family provided with [facility] Work Department's list of facilities offering this level of care within the geographic area requested by the patient (or if unable, by the patient's family).  Yes   Patient/family informed of their freedom to choose among providers that offer the needed level of care, that participate in Medicare, Medicaid or managed care program needed by the patient, have an available bed and are willing to accept the patient.  Yes   Patient/family informed of [facility]'s ownership interest in [facility] and [facility], as well as of the fact that they are under no obligation to receive care at these facilities.  PASRR submitted to EDS on 11/02/17     PASRR number received on 11/02/17     Existing PASRR number confirmed on       FL2 transmitted to all facilities in geographic area requested by pt/family on 11/02/17     FL2 transmitted to all facilities within larger geographic area on       Patient informed that his/her managed care company has contracts with or will negotiate with certain facilities, including the following:        Yes   Patient/family informed of bed offers received.  Patient chooses bed at [facility] recommends and patient chooses bed at      Patient to be transferred to [facility] on 11/02/17.  Patient to be transferred to facility by [facility]     Patient family notified on 11/02/17 of transfer.  Name of family member notified:  Patient alerting her friend. She has no living family.     PHYSICIAN Please sign  FL2     Additional Comment:    _______________________________________________ Benard Halsted, LCSWA 11/02/2017, 11:25 AM

## 2017-11-02 NOTE — Progress Notes (Signed)
Reviewed Brain MRI No evidence of pituitary apoplexy Will continue with surgery as originally planned 5/31

## 2017-11-02 NOTE — NC FL2 (Signed)
[place] LEVEL OF CARE SCREENING TOOL     IDENTIFICATION  Patient Name: Mariah [name] Birthdate: January 14, 1951 Sex: female Admission Date (Current Location): 10/30/2017  [facility] [place] [facility] [place] and [place] Number:  Herbalist and Address:  [facility], [place] [address], [place], [place] [address]      Provider Number: [id number]  Attending Physician Name and Address:  Axel Filler, *  Relative Name and Phone Number:       Current Level of Care: Hospital Recommended Level of Care: [place] Prior Approval Number:    Date Approved/Denied:   PASRR Number: [id number]  Discharge Plan: SNF    Current Diagnoses: Patient Active Problem List   Diagnosis Date Noted  . Acute encephalopathy 10/30/2017  . Encephalopathy acute 10/30/2017  . Postmenopausal bleeding 09/01/2017  . Screening for colon cancer 09/01/2017  . Thyroid nodule 09/01/2017  . Daytime sleepiness 08/15/2017  . Bilateral knee pain 08/15/2017  . Fall 08/15/2017  . Screening for breast cancer 05/25/2017  . NAFLD (nonalcoholic fatty liver disease) 05/24/2017  . Morbid obesity ([place]) 10/21/2012  . Type 2 diabetes mellitus treated without insulin ([place]) 09/18/2012  . FIBROIDS, UTERUS 07/10/2008  . Chronic venous stasis dermatitis of both lower extremities 07/10/2008  . Hypertension associated with diabetes ([place]) 04/26/2007  . Allergic rhinitis 04/26/2007  . Asthma 04/26/2007    Orientation RESPIRATION BLADDER Height & Weight     Time, Self, Situation, Place  Normal Continent Weight: 105.2 kg (232 lb) Height:  5\' 1"  (154.9 cm)  BEHAVIORAL SYMPTOMS/MOOD NEUROLOGICAL BOWEL NUTRITION STATUS      Continent Diet(Please see DC Summary)  AMBULATORY STATUS COMMUNICATION OF NEEDS Skin   Limited Assist Verbally Normal                       Personal Care Assistance Level of Assistance  Bathing, Feeding, Dressing Bathing Assistance: Limited assistance Feeding  assistance: Independent Dressing Assistance: Limited assistance     Functional Limitations Info  Sight Sight Info: Impaired        SPECIAL CARE FACTORS FREQUENCY  PT (By licensed PT), OT (By licensed OT)     PT Frequency: 5x/week OT Frequency: 3x/week            Contractures      Additional Factors Info  Code Status, Allergies Code Status Info: Full Allergies Info: Codeine, Doxycycline, Indomethacin, Penicillins           Current Medications (11/02/2017):  This is the current hospital active medication list Current Facility-Administered Medications  Medication Dose Route Frequency Provider Last Rate Last Dose  . acetaminophen (TYLENOL) tablet 650 mg  650 mg Oral Q6H PRN Thomasene Ripple, MD   650 mg at 11/02/17 0811  . amLODipine (NORVASC) tablet 10 mg  10 mg Oral Daily Ledell Noss, MD   10 mg at 11/02/17 1001  . enoxaparin (LOVENOX) injection 50 mg  50 mg Subcutaneous Q24H Axel Filler, MD   50 mg at 11/02/17 0004  . lisinopril (PRINIVIL,ZESTRIL) tablet 20 mg  20 mg Oral BID Kathi Ludwig, MD   20 mg at 11/02/17 1001  . mometasone-formoterol (DULERA) 100-5 MCG/ACT inhaler 2 puff  2 puff Inhalation BID Ledell Noss, MD   2 puff at 11/02/17 6144     Discharge Medications: Please see discharge summary for a list of discharge medications.  Relevant Imaging Results:  Relevant Lab Results:   Additional Information SSN: [place]  S Drusilla [place], LCSWA

## 2017-11-02 NOTE — Progress Notes (Signed)
Patient will DC to: [facility] Anticipated DC date: 11/02/17 Family notified:  Patient declined, she will notify friend Transport by: Corey Harold   Per MD patient ready for DC to [facility]. RN, patient, patient's family, and facility notified of DC. Discharge Summary sent to facility. RN given number for report [phone] Room 108). DC packet on chart. Ambulance transport requested for patient.   CSW signing off.  Cedric Fishman, LCSW Clinical Social Worker [phone]

## 2017-11-02 NOTE — Progress Notes (Signed)
Report called to [facility] home

## 2017-11-02 NOTE — Progress Notes (Signed)
Subjective: The patient was lying in her bed today for entering the room.  She just awoken from a deep sleep.  She denied acute complaints and she feels well and is encouraged by the news that she may be released today. She has opted today to be placed in a skilled nursing facility in lieu of being discharged home as she is in agreement with our conclusion that she is unable to manage on her own without additional assistance.  Patient denied headache, chest pain, nausea, vomiting, diarrhea, and attested only to continued weakness.  Objective:  Vital signs in last 24 hours: Vitals:   11/01/17 1412 11/01/17 2147 11/01/17 2152 11/02/17 0522  BP: (!) 151/80 132/69  (!) 146/73  Pulse: 91 97  93  Resp: 18 16  18   Temp: 98.4 F (36.9 C) 99.7 F (37.6 C)  98.8 F (37.1 C)  TempSrc: Oral Oral  Oral  SpO2: 99% 100% 99% 100%  Weight:      Height:       ROS negative except as per HPI  Physical exam: General: No acute distress, conversant, afebrile, Neuro: No acute neurological deficits observed Cardiovascular: RRR, no rubs or gallops auscultated with a grade 2 systolic ejection murmur best heard left sternal border today Pulmonary: Lungs are clear to auscultation bilaterally absent wheezing or crackles Musculoskeletal: Bilateral lower extremities are nonedematous, nontender.  He appears slightly improved from admission  Assessment/Plan:  Active Problems:   Hypertension associated with diabetes ([place])   Asthma   Chronic venous stasis dermatitis of both lower extremities   Type 2 diabetes mellitus treated without insulin (HCC)   Acute encephalopathy   Encephalopathy acute  Assessment:  Mariah [name] is a 67 year old female with a past medical history notable for hypertension, daytime sleepiness, and nonalcoholic fatty liver disease, who presented with a possible fall from her chair and drowsiness. Initial concern for infectious, metabolic, psychiatric or vasculitic etiologies for acute  encephalopathy were noted upon the initial evaluation. She has a notable stable mass within the sella turcica as observed on CT. Patient had no notable metabolic or hematologic derangements on  Admission of significance. Her symptoms resolved spontaneously the following morning. She self consulted neurosurgery given her admission. They had a low level of suspicion for apoplexy and as such ordered and MRI which failed to demonstrate an acute intracranial process with a stable mass in the sella turcica. She was to keep her scheduled appointment for surgery on May 31st as a result. Given her level of weakness at baseline in conjunction with her deconditioning following a brief hospitalization SNF was recommended to which she was in agreement by day three.   Plan:  Acute Encephalopathy: Resolved.  The patient continues to demonstrate improvement in her mentation.  She appears to be at baseline mentally.  However, she developed significant physical deconditioning in addition to her baseline deconditioning.  Patient will require SNF placement which she is amenable to.  MRI of the brain was negative for acute changes most notably for a stable mass in the sella turcica, no evidence of apoplexy.  She is stable for discharge to skilled nursing facility from our medical point of view.  Insulin independent diabetes: Patient on glipizide and Janumet. They have been held during admission but will be restarted on discharge.   Hypokalemia: Resolved. This was most likely secondary to decreased p.o. intake.  She warrants repeat BMP on outpatient basis to ensure resolution when she is return to normal diet  Hypertension: BP mildly  elevated to to 146/73 today.  Improved slightly from the prior days a.m. according -Continue home amlodipine 10 mg daily -Continue lisinopril 20 mg twice daily  Diet: Heart healthy Code: Full Fluids: In a DVT P PX: Lovenox Dispo: Anticipated discharge in approximately 0 day(s).    Kathi Ludwig, MD 11/02/2017, 6:51 AM Pager: Pager# [phone]

## 2017-11-02 NOTE — Discharge Instructions (Signed)
Thyroid Nodule A thyroid nodule is an isolatedgrowth of thyroid cells that forms a lump in your thyroid gland. The thyroid gland is a butterfly-shaped gland. It is found in the lower front of your neck. This gland sends chemical messengers (hormones) through your blood to all parts of your body. These hormones are important in regulating your body temperature and helping your body to use energy. Thyroid nodules are common. Most are not cancerous (are benign). You may have one nodule or several nodules. Different types of thyroid nodules include:  Nodules that grow and fill with fluid (thyroid cysts).  Nodules that produce too much thyroid hormone (hot nodules or hyperthyroid).  Nodules that produce no thyroid hormone (cold nodules or hypothyroid).  Nodules that form from cancer cells (thyroid cancers).  What are the causes? Usually, the cause of this condition is not known. What increases the risk? Factors that make this condition more likely to develop include:  Increasing age. Thyroid nodules become more common in people who are older than 65 years of age.  Gender. ? Benign thyroid nodules are more common in women. ? Cancerous (malignant) thyroid nodules are more common in men.  A family history that includes: ? Thyroid nodules. ? Pheochromocytoma. ? Thyroid carcinoma. ? Hyperparathyroidism.  Certain kinds of thyroid diseases, such as Hashimoto thyroiditis.  Lack of iodine.  A history of head and neck radiation, such as from X-rays.  What are the signs or symptoms? It is common for this condition to cause no symptoms. If you have symptoms, they may include:  A lump in your lower neck.  Feeling a lump or tickle in your throat.  Pain in your neck, jaw, or ear.  Having trouble swallowing.  Hot nodules may cause symptoms that include:  Weight loss.  Warm, flushed skin.  Feeling hot.  Feeling nervous.  A racing heartbeat.  Cold nodules may cause symptoms  that include:  Weight gain.  Dry skin.  Brittle hair. This may also occur with hair loss.  Feeling cold.  Fatigue.  Thyroid cancer nodules may cause symptoms that include:  Hard nodules that feel stuck to the thyroid gland.  Hoarseness.  Lumps in the glands near your thyroid (lymph nodes).  How is this diagnosed? A thyroid nodule may be felt by your health care provider during a physical exam. This condition may also be diagnosed based on your symptoms. You may also have tests, including:  An ultrasound. This may be done to confirm the diagnosis.  A biopsy. This involves taking a sample from the nodule and looking at it under a microscope to see if the nodule is benign.  Blood tests to make sure that your thyroid is working properly.  Imaging tests such as MRI or CT scan may be done if: ? Your nodule is large. ? Your nodule is blocking your airway. ? Cancer is suspected.  How is this treated? Treatment depends on the cause and size of your nodule or nodules. If the nodule is benign, treatment may not be necessary. Your health care provider may monitor the nodule to see if it goes away without treatment. If the nodule continues to grow, is cancerous, or does not go away:  It may need to be drained with a needle.  It may need to be removed with surgery.  If you have surgery, part or all of your thyroid gland may need to be removed as well. Follow these instructions at home:  Pay attention to any changes in  your nodule.  Take over-the-counter and prescription medicines only as told by your health care provider.  Keep all follow-up visits as told by your health care provider. This is important. Contact a health care provider if:  Your voice changes.  You have trouble swallowing.  You have pain in your neck, ear, or jaw that is getting worse.  Your nodule gets bigger.  Your nodule starts to make it harder for you to breathe. Get help right away if:  You have a  sudden fever.  You feel very weak.  Your muscles look like they are shrinking (muscle wasting).  You have mood swings.  You feel very restless.  You feel confused.  You are seeing or hearing things that other people do not see or hear (having hallucinations).  You feel suddenly nauseous or throw up.  You suddenly have diarrhea.  You have chest pain.  There is a loss of consciousness. This information is not intended to replace advice given to you by your health care provider. Make sure you discuss any questions you have with your health care provider. Document Released: 06/03/2004 Document Revised: 03/13/2016 Document Reviewed: 10/22/2014 Elsevier Interactive Patient Education  [employer]. Please be sure to keep your follow-up appointments as they are scheduled.  If you develop confusion, headache, fever, chills, nausea, vomiting, chest pain, abdominal pain, or other concerning symptoms that is not resolving please contact her PCP immediately and to visit the emergency department.  Thank you for your visit to the [facility]. If you have any questions or concerns please call your doctor and she will notify us.

## 2017-11-02 NOTE — Progress Notes (Signed)
Patient has accepted bed at [facility] and requests PTAR.   Percell Locus Junaid Wurzer LCSW [phone]

## 2017-11-02 NOTE — Plan of Care (Signed)
  Problem: Clinical Measurements: Goal: Diagnostic test results will improve Outcome: Progressing   Problem: Pain Managment: Goal: General experience of comfort will improve Outcome: Progressing   

## 2017-11-02 NOTE — Clinical Social Work Note (Signed)
Clinical Social Work Assessment  Patient Details  Name: Mariah [name] MRN: [id number] Date of Birth: 03/10/1951  Date of referral:  11/02/17               Reason for consult:  Facility Placement                Permission sought to share information with:  Facility Sport and exercise psychologist, Family Supports Permission granted to share information::  Yes, Verbal Permission Granted  Name::        Agency::  SNFs  Relationship::     Contact Information:     Housing/Transportation Living arrangements for the past 2 months:  Apartment Source of Information:  Patient Patient Interpreter Needed:  None Criminal Activity/Legal Involvement Pertinent to Current Situation/Hospitalization:  No - Comment as needed Significant Relationships:  Friend Lives with:  Self Do you feel safe going back to the place where you live?  Yes Need for family participation in patient care:  No (Coment)  Care giving concerns:  CSW received consult for possible SNF placement at time of discharge. CSW spoke with patient regarding PT recommendation of SNF placement at time of discharge. Patient originally stated that she did not want to go to SNF and wanted to return home so she could manage her home affairs. CSW was re-consulted due to patient changing her mind. Patient expressed understanding of PT recommendation and is agreeable to SNF placement at time of discharge. CSW to continue to follow and assist with discharge planning needs.   Social Worker assessment / plan:  CSW spoke with patient concerning possibility of rehab at [facility] before returning home.  Employment status:  Retired Forensic scientist:  Medicare PT Recommendations:  [place] / Referral to community resources:  [place]  Patient/Family's Response to care:  Patient recognizes need for rehab before returning home and is agreeable to a SNF in [place].  Patient/Family's Understanding of and Emotional  Response to Diagnosis, Current Treatment, and Prognosis:  Patient/family is realistic regarding therapy needs and expressed being hopeful for SNF placement. Patient expressed understanding of CSW role and discharge process as well as medical condition. No questions/concerns about plan or treatment.    Emotional Assessment Appearance:  Appears stated age Attitude/Demeanor/Rapport:  Engaged Affect (typically observed):  Pleasant Orientation:  Oriented to Self, Oriented to Place, Oriented to  Time, Oriented to Situation Alcohol / Substance use:  Not Applicable Psych involvement (Current and /or in the community):  No (Comment)  Discharge Needs  Concerns to be addressed:  Care Coordination Readmission within the last 30 days:  No Current discharge risk:  None Barriers to Discharge:  No Barriers Identified   Benard Halsted, [place] 11/02/2017, 10:17 AM

## 2017-11-04 LAB — CULTURE, BLOOD (ROUTINE X 2)
CULTURE: NO GROWTH
CULTURE: NO GROWTH
SPECIAL REQUESTS: ADEQUATE
Special Requests: ADEQUATE

## 2017-11-06 ENCOUNTER — Encounter: Payer: Self-pay | Admitting: Internal Medicine

## 2017-11-06 ENCOUNTER — Encounter: Payer: Self-pay | Admitting: Adult Health

## 2017-11-06 ENCOUNTER — Non-Acute Institutional Stay (SKILLED_NURSING_FACILITY): Payer: Medicare Other | Admitting: Internal Medicine

## 2017-11-06 ENCOUNTER — Encounter: Payer: Self-pay | Admitting: Obstetrics and Gynecology

## 2017-11-06 DIAGNOSIS — R930 Abnormal findings on diagnostic imaging of skull and head, not elsewhere classified: Secondary | ICD-10-CM | POA: Diagnosis not present

## 2017-11-06 DIAGNOSIS — E041 Nontoxic single thyroid nodule: Secondary | ICD-10-CM | POA: Diagnosis not present

## 2017-11-06 DIAGNOSIS — M79646 Pain in unspecified finger(s): Secondary | ICD-10-CM | POA: Insufficient documentation

## 2017-11-06 DIAGNOSIS — N939 Abnormal uterine and vaginal bleeding, unspecified: Secondary | ICD-10-CM | POA: Diagnosis not present

## 2017-11-06 DIAGNOSIS — G9340 Encephalopathy, unspecified: Secondary | ICD-10-CM

## 2017-11-06 NOTE — Assessment & Plan Note (Addendum)
Patient states Dr. 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

## 2017-11-06 NOTE — Assessment & Plan Note (Addendum)
As per NS Verify Endocrinology involvement to manage postop pituitary resection hormone replacement if needed for potential adrenal insufficiency, diabetes insipidis & SIADH

## 2017-11-06 NOTE — Assessment & Plan Note (Signed)
Gynecology assessment

## 2017-11-06 NOTE — Progress Notes (Signed)
NURSING HOME LOCATION:  [facility] ROOM NUMBER:  [id number]  CODE STATUS:  Full Code  PCP:  [name], MD  [place] [id number]   This is a comprehensive admission note to [facility] performed on this date less than 30 days from date of admission. Included are preadmission medical/surgical history;reconciled medication list; family history; social history and comprehensive review of systems.  Corrections and additions to the records were documented. Comprehensive physical exam was also performed. Additionally a clinical summary was entered for each active diagnosis pertinent to this admission in the Problem List to enhance continuity of care.  HPI: Patient was hospitalized 4/8-4/11/19 presenting with acute encephalopathy manifested as drowsiness with subsequent fall from her chair. 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 Also the CT revealed a 4.1 cm solid-appearing nodule in the thyroid isthmus. Additionally radiographic sinusitis was suggested. No infectious, metabolic, psychiatric, or vasculitic etiology for the acute encephalopathy was documented. There were no significant metabolic or hematologic abnormalities on lab testing. Symptoms had resolved by the next morning. Reportedly the patient "self consulted" neurosurgery ;neurosurgery had low suspicion for apoplexy. MRI revealed no acute process. The patient does have a follow-up on 5/31 with neurosurgery. Because of baseline weakness and deconditioning, SNF was recommended for rehabilitation. Labs at discharge revealed glucose 124, calcium 8.4. Pituitary studies included normal LH, FSH, Somatomedin C. Prolactin mildly elevated at 29.6 (4.8-23.3). Free T4 was 1.17 (0.61-1.12) & TSH 2.749. Ultrasound of thyroid had been performed 3/5. This showed an isthmus nodule of 4.2 x 2.4 x 2.1 cm with a smaller 2.1 x 1.4 x 1.1 cm nodule within  it. Fine needle aspiration has been recommended & is being scheduled by Dr Wilburn Cornelia ENT.  Past medical and surgical history: Includes essential hypertension, NASH, type 2 diabetes, history of migraines, GERD, and asthma. Surgeries include urine fibroid surgery and left TKA.  Social history: Nonsmoker, nondrinker  Family history: Reviewed   Review of systems:  She states that her ophthalmologist noted changes on his exam & ordered MRI revealing the sella turcica mass. Ophthalmology referred her to the neurosurgeon.She does not believe that she seen an endocrinologist.  She states that she falls asleep easily at rest. She denies a diagnosis of sleep apnea. She does snore. There is no cardiac or neurologic prodrome prior to the falls in the context of excess somnulence. She has some rhinitis symptoms. She also has intermittent chills.She has no definite rhinosinusitis symptoms.Edema is a chronic problem as is constipation. Vaginal spotting & clotting occurs intermittently. She's not seen a gynecologist for 8 years. She has polyuria and urinary incontinence which she believes is related to the pituitary mass.There has been no definite diagnosis of diabetes insipidis or SIADH. She's having pain of both knees and requests Biofreeze.  Constitutional: No definite fever,significant weight change  Eyes: No redness, discharge, pain, vision change ENT/mouth: No nasal congestion, purulent discharge, earache, change in hearing, sore throat  Cardiovascular: No chest pain, palpitations, paroxysmal nocturnal dyspnea, claudication Respiratory: No cough, sputum production, hemoptysis, DOE Gastrointestinal: No heartburn, dysphagia, abdominal pain, nausea /vomiting, rectal bleeding, melena Genitourinary: No dysuria, hematuria, pyuria Dermatologic: No rash, pruritus, new change in appearance of skin Neurologic: No dizziness, headache, definite syncope, seizures, numbness, tingling Psychiatric: No significant  anxiety, depression, insomnia, anorexia Endocrine: No new change in hair/skin/ nails, excessive thirst, excessive hunger  Hematologic/lymphatic: No significant bruising, lymphadenopathy, abnormal bleeding Allergy/immunology: No  itchy/watery eyes, significant sneezing, urticaria, angioedema  Physical exam:  Pertinent or positive findings:Pupils are small. Dental hygiene is fair. Thyroid is firm and I cannot appreciate a definite nodule. The first heart sound is significantly increased. There is slight slurring suggesting a grade 1/2 systolic murmur at the left base. She has low-grade rhonchi homogenously. She has tense edema of the lower extremities. Has marked stasis dermatitis with raised sclerotic lesions of the lower extremities, greater on the left. Pedal pulses are decreased. Toenails are elongated and slightly deformed.  General appearance: Adequately nourished; no acute distress, increased work of breathing is present.   Lymphatic: No lymphadenopathy about the head, neck, axilla. Eyes: No conjunctival inflammation or lid edema is present. There is no scleral icterus. Ears:  External ear exam shows no significant lesions or deformities.   Nose:  External nasal examination shows no deformity or inflammation. Nasal mucosa are pink and moist without lesions, exudates Oral exam: Lips and gums are healthy appearing.There is no oropharyngeal erythema or exudate. Neck:  No thyromegaly, masses, tenderness noted.    Heart:  Normal rate and regular rhythm.  S2 normal without gallop, click, rub .  Lungs:  without wheezes, rales, rubs. Abdomen: Bowel sounds are normal.  Abdomen is soft and nontender with no organomegaly, hernias, masses. GU: Deferred  Extremities:  No cyanosis, clubbing  Neurologic exam:  Strength equal  in upper & lower extremities Balance, Rhomberg, finger to nose testing could not be completed due to clinical state Skin: Warm & dry w/o tenting. No significant rash.  See clinical  summary under each active problem in the Problem List with associated updated therapeutic plan

## 2017-11-06 NOTE — Assessment & Plan Note (Addendum)
F/U with PCP; ? Sleep apnea evaluation

## 2017-11-06 NOTE — Progress Notes (Signed)
This encounter was created in error - please disregard.

## 2017-11-07 ENCOUNTER — Other Ambulatory Visit: Payer: Self-pay

## 2017-11-07 ENCOUNTER — Emergency Department (HOSPITAL_COMMUNITY)
Admission: EM | Admit: 2017-11-07 | Discharge: 2017-11-07 | Disposition: A | Discharge status: Home / Self Care | Payer: Medicare Other | Attending: Emergency Medicine | Admitting: Emergency Medicine

## 2017-11-07 ENCOUNTER — Non-Acute Institutional Stay (SKILLED_NURSING_FACILITY): Payer: Medicare Other | Admitting: Internal Medicine

## 2017-11-07 ENCOUNTER — Encounter (HOSPITAL_COMMUNITY): Payer: Self-pay | Admitting: *Deleted

## 2017-11-07 ENCOUNTER — Encounter: Payer: Self-pay | Admitting: Internal Medicine

## 2017-11-07 DIAGNOSIS — N390 Urinary tract infection, site not specified: Secondary | ICD-10-CM | POA: Diagnosis not present

## 2017-11-07 DIAGNOSIS — R404 Transient alteration of awareness: Secondary | ICD-10-CM

## 2017-11-07 DIAGNOSIS — Z7984 Long term (current) use of oral hypoglycemic drugs: Secondary | ICD-10-CM | POA: Diagnosis not present

## 2017-11-07 DIAGNOSIS — R400 Somnolence: Secondary | ICD-10-CM

## 2017-11-07 DIAGNOSIS — E119 Type 2 diabetes mellitus without complications: Secondary | ICD-10-CM | POA: Diagnosis not present

## 2017-11-07 DIAGNOSIS — E162 Hypoglycemia, unspecified: Secondary | ICD-10-CM | POA: Diagnosis not present

## 2017-11-07 DIAGNOSIS — Z79899 Other long term (current) drug therapy: Secondary | ICD-10-CM | POA: Insufficient documentation

## 2017-11-07 DIAGNOSIS — R930 Abnormal findings on diagnostic imaging of skull and head, not elsewhere classified: Secondary | ICD-10-CM

## 2017-11-07 DIAGNOSIS — J45909 Unspecified asthma, uncomplicated: Secondary | ICD-10-CM | POA: Diagnosis not present

## 2017-11-07 DIAGNOSIS — E11649 Type 2 diabetes mellitus with hypoglycemia without coma: Secondary | ICD-10-CM | POA: Diagnosis not present

## 2017-11-07 DIAGNOSIS — I10 Essential (primary) hypertension: Secondary | ICD-10-CM | POA: Insufficient documentation

## 2017-11-07 LAB — CBC
HCT: 41 % (ref 36.0–46.0)
HEMOGLOBIN: 13.2 g/dL (ref 12.0–15.0)
MCH: 29.1 pg (ref 26.0–34.0)
MCHC: 32.2 g/dL (ref 30.0–36.0)
MCV: 90.3 fL (ref 78.0–100.0)
Platelets: 413 10*3/uL — ABNORMAL HIGH (ref 150–400)
RBC: 4.54 MIL/uL (ref 3.87–5.11)
RDW: 15.1 % (ref 11.5–15.5)
WBC: 19.3 10*3/uL — AB (ref 4.0–10.5)

## 2017-11-07 LAB — URINALYSIS, ROUTINE W REFLEX MICROSCOPIC
BILIRUBIN URINE: NEGATIVE
Bacteria, UA: NONE SEEN
Glucose, UA: 150 mg/dL — AB
Ketones, ur: NEGATIVE mg/dL
NITRITE: NEGATIVE
PH: 6 (ref 5.0–8.0)
Protein, ur: 30 mg/dL — AB
SPECIFIC GRAVITY, URINE: 1.013 (ref 1.005–1.030)

## 2017-11-07 LAB — COMPREHENSIVE METABOLIC PANEL
ALK PHOS: 76 U/L (ref 38–126)
ALT: 20 U/L (ref 14–54)
AST: 23 U/L (ref 15–41)
Albumin: 3.3 g/dL — ABNORMAL LOW (ref 3.5–5.0)
Anion gap: 10 (ref 5–15)
BUN: 17 mg/dL (ref 6–20)
CALCIUM: 8.8 mg/dL — AB (ref 8.9–10.3)
CO2: 22 mmol/L (ref 22–32)
CREATININE: 0.77 mg/dL (ref 0.44–1.00)
Chloride: 105 mmol/L (ref 101–111)
Glucose, Bld: 81 mg/dL (ref 65–99)
Potassium: 3.9 mmol/L (ref 3.5–5.1)
Sodium: 137 mmol/L (ref 135–145)
Total Bilirubin: 0.7 mg/dL (ref 0.3–1.2)
Total Protein: 8.5 g/dL — ABNORMAL HIGH (ref 6.5–8.1)

## 2017-11-07 LAB — CBG MONITORING, ED
GLUCOSE-CAPILLARY: 79 mg/dL (ref 65–99)
GLUCOSE-CAPILLARY: 80 mg/dL (ref 65–99)
GLUCOSE-CAPILLARY: 73 mg/dL (ref 65–99)
Glucose-Capillary: 133 mg/dL — ABNORMAL HIGH (ref 65–99)
Glucose-Capillary: 61 mg/dL — ABNORMAL LOW (ref 65–99)

## 2017-11-07 MED ORDER — CEPHALEXIN 500 MG PO CAPS: 500.0000 mg | ORAL_CAPSULE | Freq: Four times a day (QID) | ORAL | 0 refills | Status: DC

## 2017-11-07 MED ORDER — CEPHALEXIN 250 MG PO CAPS
500.0000 mg | ORAL_CAPSULE | Freq: Once | ORAL | Status: AC
  Administered 2017-11-07: 500 mg via ORAL
  Filled 2017-11-07: qty 2

## 2017-11-07 NOTE — Assessment & Plan Note (Signed)
D/C Glipizide Endocrinology evaluation indicated

## 2017-11-07 NOTE — Assessment & Plan Note (Signed)
Chronic issue; R/O Sleep Apnea

## 2017-11-07 NOTE — ED Notes (Signed)
PTAR called @ 1542-per RN-called by Levada Dy

## 2017-11-07 NOTE — ED Notes (Addendum)
Called ptar for transport  

## 2017-11-07 NOTE — ED Notes (Signed)
EDP at bedside  

## 2017-11-07 NOTE — ED Notes (Signed)
EDP aware of CBG of 79.  Will provide patient with [place] sandwich and diet sprite

## 2017-11-07 NOTE — ED Notes (Signed)
MD aware of patient's repeat glucose, provided patient with regular soda and graham crackers and peanut butter

## 2017-11-07 NOTE — Discharge Instructions (Signed)
It was our pleasure to provide your ER care today - we hope that you feel better.  Your blood sugar was low, but is better now.  Check blood sugar 4x/day and record values. Make sure to eat meals regularly, and do not skip or delay meals.   Continue your janumet, but stop taking the glipizide and metformin.   If you begin to feel sugar may be low - eat or drink something right away and check sugar.   The lab tests show a possible urine infection - take keflex (antibiotic) as prescribed.   Follow up with primary care doctor in the next 1-2 days for recheck.  Return to ER if worse, new symptoms, fevers, recurrent low blood sugars, other concern.

## 2017-11-07 NOTE — Assessment & Plan Note (Signed)
As noted endocrinology consultation recommended preop

## 2017-11-07 NOTE — ED Provider Notes (Signed)
[place] [facility] EMERGENCY DEPARTMENT Provider Note   CSN: [id number] Arrival date & time: 11/07/17  1056     History   Chief Complaint Chief Complaint  Patient presents with  . Altered Mental Status    HPI Mariah [name] is a 67 y.o. female.  Patient with hx diabetes, presents from [facility] via EMS with low blood sugar and altered mental status. Pt indicates she ate little last night and had not yet eaten today. cbg was 28, patient was given glucose po and iv. Symptoms were acute onset, moderate/sev, but now improved. Pt currently indicates feels fine but is hungry. Denies nausea or vomiting. No fever or chills. Patient denies pain.  Pt is unaware of recent change in meds.   The history is provided by the patient.  Altered Mental Status      Past Medical History:  Diagnosis Date  . Allergic rhinitis   . Arthritis    "ankles, feet, knees" (10/30/2017)  . Asthma   . Bursitis   . Carpal tunnel syndrome   . GERD (gastroesophageal reflux disease)   . HTN (hypertension)   . Migraine    "a couple/yr" (10/30/2017)  . Obesity   . Pneumonia 1960s X 1  . Sciatica   . Tendinitis   . Type II diabetes mellitus [facility])     Patient Active Problem List   Diagnosis Date Noted  . Hypoglycemia 11/07/2017  . 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 Vaginal bleeding 11/06/2017  . Acute encephalopathy 10/30/2017  . Postmenopausal bleeding 09/01/2017  . Screening for colon cancer 09/01/2017  . Thyroid nodule 09/01/2017  . Daytime sleepiness 08/15/2017  . Bilateral knee pain 08/15/2017  . Fall 08/15/2017  . Screening for breast cancer 05/25/2017  . NAFLD (nonalcoholic fatty liver disease) 05/24/2017  . Morbid obesity ([place]) 10/21/2012  . Type 2 diabetes mellitus treated without insulin ([place]) 09/18/2012  . FIBROIDS, UTERUS 07/10/2008  . Chronic venous stasis dermatitis of both lower extremities 07/10/2008  . Hypertension associated  with diabetes ([place]) 04/26/2007  . Allergic rhinitis 04/26/2007  . Asthma 04/26/2007    Past Surgical History:  Procedure Laterality Date  . GANGLION CYST EXCISION Right   . KNEE ARTHROSCOPY Left 2003  . PLANTAR FASCIA RELEASE Right 1995   "heel"  . TONSILLECTOMY  1958  . [place]; 1997   Dr [name]     OB History   None      Home Medications    Prior to Admission medications   Medication Sig Start Date End Date Taking? Authorizing Provider  albuterol (PROVENTIL HFA;VENTOLIN HFA) 108 (90 Base) MCG/ACT inhaler Inhale 2 puffs into the lungs every 6 (six) hours as needed for wheezing or shortness of breath.    [provider]  amLODipine (NORVASC) 10 MG tablet Take 1 tablet (10 mg total) by mouth daily. 07/27/17   Axel Filler, MD  esomeprazole (NEXIUM) 20 MG capsule Take 1 capsule (20 mg total) by mouth every morning. For acid reflex 09/01/17   [name], MD  Fluticasone-Salmeterol (ADVAIR DISKUS) 100-50 MCG/DOSE AEPB Inhale 1 puff into the lungs 2 (two) times daily. 11/02/17   Kathi Ludwig, MD  furosemide (LASIX) 40 MG tablet Take 40 mg by mouth.     [provider]  glipiZIDE (GLUCOTROL XL) 10 MG 24 hr tablet Take 2 tablets (20 mg total) by mouth daily with breakfast. 05/25/17   Zada Finders, MD  lisinopril (  PRINIVIL,ZESTRIL) 10 MG tablet Take 20 mg by mouth 2 (two) times daily.  10/11/17   [provider]  Menthol, Topical Analgesic, (BIOFREEZE EX) Apply 1 application topically 2 (two) times daily as needed (For arthritis pain). Apply to bilateral knees BID PRN    [provider]  metFORMIN (GLUCOPHAGE) 1000 MG tablet Take 1,000 mg by mouth 2 (two) times daily with a meal. Give until Janumet could be brought in from home    [provider]  mometasone (NASONEX) 50 MCG/ACT nasal spray Place 1 spray into the nose as needed. 10/14/14   [provider]  naproxen (NAPROSYN) 500 MG tablet Take 1 tablet  (500 mg total) by mouth 2 (two) times daily with a meal. 08/15/17 08/15/18  Alphonzo Grieve, MD  sitaGLIPtin-metformin (JANUMET) 50-1000 MG tablet Take 1 tablet by mouth 2 (two) times daily with a meal. 09/01/17   [name], MD    Family History Family History  Problem Relation Age of Onset  . Uterine cancer Mother   . Hypertension Mother   . Heart disease Mother   . Hypertension Father   . Alzheimer's disease Father   . Cancer Other     Social History Social History   Tobacco Use  . Smoking status: Never Smoker  . Smokeless tobacco: Never Used  Substance Use Topics  . Alcohol use: No  . Drug use: No     Allergies   Codeine; Doxycycline; Indomethacin; and Penicillins   Review of Systems Review of Systems  Constitutional: Negative for fever.  HENT: Negative for sinus pressure.   Eyes: Negative for visual disturbance.  Respiratory: Negative for shortness of breath.   Cardiovascular: Negative for chest pain and leg swelling.  Gastrointestinal: Negative for abdominal pain and vomiting.  Endocrine: Negative for polyuria.  Genitourinary: Negative for flank pain.  Musculoskeletal: Negative for back pain and neck pain.  Skin: Negative for rash.  Neurological: Negative for headaches.  Hematological: Does not bruise/bleed easily.  Psychiatric/Behavioral: The patient is not nervous/anxious.      Physical Exam Updated Vital Signs BP (!) 149/79 (BP Location: Left Arm)   Pulse 84   Temp 97.7 F (36.5 C) (Oral)   Resp 16   Ht 1.549 m (5\' 1" )   Wt 98.4 kg (217 lb)   LMP 04/08/2013 Comment: spotting & clotting; Gyn F/U indicated  SpO2 96%   BMI 41.00 kg/m   Physical Exam  Constitutional: She appears well-developed and well-nourished. No distress.  HENT:  Head: Atraumatic.  Eyes: Conjunctivae are normal. No scleral icterus.  Neck: Neck supple. No tracheal deviation present. No thyromegaly present.  Cardiovascular: Normal rate, regular rhythm, normal heart sounds and  intact distal pulses.  Pulmonary/Chest: Effort normal and breath sounds normal. No respiratory distress.  Abdominal: Soft. Normal appearance and bowel sounds are normal. She exhibits no distension. There is no tenderness.  Musculoskeletal:  CTLS spine, non tender, aligned, no step off. Good rom bil extremities without pain or focal bony tenderness.   Neurological: She is alert.  Alert, speech clear/fluent. Motor intact bil. sens grossly intact.   Skin: Skin is warm and dry. No rash noted.  Psychiatric: She has a normal mood and affect.  Nursing note and vitals reviewed.    ED Treatments / Results  Labs (all labs ordered are listed, but only abnormal results are displayed) Results for orders placed or performed during the hospital encounter of 11/07/17  CBC  Result Value Ref Range   WBC 19.3 (H) 4.0 -  10.5 K/uL   RBC 4.54 3.87 - 5.11 MIL/uL   Hemoglobin 13.2 12.0 - 15.0 g/dL   HCT 41.0 36.0 - 46.0 %   MCV 90.3 78.0 - 100.0 fL   MCH 29.1 26.0 - 34.0 pg   MCHC 32.2 30.0 - 36.0 g/dL   RDW 15.1 11.5 - 15.5 %   Platelets 413 (H) 150 - 400 K/uL  Comprehensive metabolic panel  Result Value Ref Range   Sodium 137 135 - 145 mmol/L   Potassium 3.9 3.5 - 5.1 mmol/L   Chloride 105 101 - 111 mmol/L   CO2 22 22 - 32 mmol/L   Glucose, Bld 81 65 - 99 mg/dL   BUN 17 6 - 20 mg/dL   Creatinine, Ser 0.77 0.44 - 1.00 mg/dL   Calcium 8.8 (L) 8.9 - 10.3 mg/dL   Total Protein 8.5 (H) 6.5 - 8.1 g/dL   Albumin 3.3 (L) 3.5 - 5.0 g/dL   AST 23 15 - 41 U/L   ALT 20 14 - 54 U/L   Alkaline Phosphatase 76 38 - 126 U/L   Total Bilirubin 0.7 0.3 - 1.2 mg/dL   GFR calc non Af Amer >60 >60 mL/min   GFR calc Af Amer >60 >60 mL/min   Anion gap 10 5 - 15  Urinalysis, Routine w reflex microscopic  Result Value Ref Range   Color, Urine YELLOW YELLOW   APPearance CLEAR CLEAR   Specific Gravity, Urine 1.013 1.005 - 1.030   pH 6.0 5.0 - 8.0   Glucose, UA 150 (A) NEGATIVE mg/dL   Hgb urine dipstick LARGE  (A) NEGATIVE   Bilirubin Urine NEGATIVE NEGATIVE   Ketones, ur NEGATIVE NEGATIVE mg/dL   Protein, ur 30 (A) NEGATIVE mg/dL   Nitrite NEGATIVE NEGATIVE   Leukocytes, UA SMALL (A) NEGATIVE   RBC / HPF TOO NUMEROUS TO COUNT 0 - 5 RBC/hpf   WBC, UA 6-30 0 - 5 WBC/hpf   Bacteria, UA NONE SEEN NONE SEEN   Squamous Epithelial / LPF 0-5 (A) NONE SEEN  CBG monitoring, ED  Result Value Ref Range   Glucose-Capillary 133 (H) 65 - 99 mg/dL  CBG monitoring, ED  Result Value Ref Range   Glucose-Capillary 79 65 - 99 mg/dL  POC CBG, ED  Result Value Ref Range   Glucose-Capillary 61 (L) 65 - 99 mg/dL    EKG EKG Interpretation  Date/Time:  Tuesday November 07 2017 11:06:05 EDT Ventricular Rate:  84 PR Interval:    QRS Duration: 74 QT Interval:  364 QTC Calculation: 431 R Axis:   23 Text Interpretation:  Sinus rhythm Nonspecific T abnormalities Confirmed by Lajean Saver [phone]) on 11/07/2017 1:34:53 PM   Radiology No results found.  Procedures Procedures (including critical care time)  Medications Ordered in ED Medications - No data to display   Initial Impression / Assessment and Plan / ED Course  I have reviewed the triage vital signs and the nursing notes.  Pertinent labs & imaging results that were available during my care of the patient were reviewed by me and considered in my medical decision making (see chart for details).  Iv ns. Labs.  cbg now normal.  Po fluids. Meal.   Reviewed nursing notes and prior charts for additional history.   ?whether possibly patient receiving duplicative diabetic med at [facility].  Pt notes was started on sitagliptin.  Will d/c other metformin and glipizide.   Additional po fluids/food. Pt ate well. Will recheck sugar.  Possible uti on  labs. Keflex po.  Patient is awake and alert. Normal mental status. She denies pain, sob or any other current symptom. States feels fine.     Final Clinical Impressions(s) / ED Diagnoses   Final diagnoses:    None    ED Discharge Orders    None       Lajean Saver, MD 11/07/17 1456

## 2017-11-07 NOTE — Patient Instructions (Signed)
See assessment and plan under each diagnosis in the problem list and acutely for this visit 

## 2017-11-07 NOTE — Assessment & Plan Note (Signed)
See 11/07/17 severe hypoglycemia of 28 without definite cause; she is on 20 g of glipizide each morning but did not receive that the morning of 4/16. She is on Janumet twice a day, but this should not cause this degree of hypoglycemia. Endocrinology consult

## 2017-11-07 NOTE — Patient Instructions (Addendum)
See assessment and plan under each diagnosis in the problem list and acutely for this visit. Total time 64 minutes; greater than 50% of the visit spent coordinating care for problems addressed at this encounter

## 2017-11-07 NOTE — Progress Notes (Signed)
  PCP: [name] MD  [facility] Room 108  This is a nursing facility follow up for specific acute issue of somnulence and severe  hypoglycemia.  HPI: An approximately 8:50 AM the patient was found unresponsive when her breakfast was brought into the room. Vital signs were stable but she exhibited extremely loud snoring and was unresponsive even to noxious stimuli. According to her Nurse she went home last night for approximately 1.5 hours to retrieve clothing. Also she was going to bring her Janumet from home as this is not on the SNF formulary due to cost. Until the Janumet could be brought from home she was to be placed on metformin 1000 mg twice a day here. Once the Janumet was available the metformin alone was be discontinued. She did receive a snack upon return at approximately 10 PM. From that time until this morning medications administered included Advair, Janumet, lisinopril, and Naprosyn. She has glipizide 20 mg ordered each am daily. She did NOT receive that this morning. By history she received no sedatives or narcotics. Significantly she was hospitalized 4/8-4/11/19 with acute encephalopathy manifested as drowsiness and assessment fall from a chair. No hypoglycemia was reported by the ED physician 4/8.  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  Review of systems: Could not be completed due to obtunded state.  Physical exam: Initially the patient exhibited extremely loud snoring. She was noted to be drooling from the right mouth. When the lids were retracted there was minimal anisocoria, right pupil minimally larger than the left. Pupils were reactive to light. There was no focus visually. The harsh rhonchi clinically appeared to be slightly greater on the left than the right. Heart sounds cannot be heard. Abdomen was massive. Limbs were limp.  She received an ampule of glucagon with possible slight decrease in  obtunded state. An IV of D5 was started and she was given an ampule of D50. After the administration of the D50 the patient became alert and communicative. She denied any medication intake at home prior to her return here. Specifically she denies intake of sleeping pills, tranquilizers, or narcotics while at home.  #1 severe hypoglycemia of 28 in the context of only Janumet administration as a hypoglycemic agent last evening.She does have glipizide 20 mg ordered each morning; she did NOT receive that this morning. Glipizide will be discontinued because of the hypoglycemic risk even though it does not appear to be the inciting cause of today's severe hypoglycemia. The severe hypoglycemia is in the context of a sella turcica/parasellar cistern mass for which neurosurgery is planned. #2 history of recurrent episodes of somnolence and falls; rule out sleep apnea Plan: As there is no ready explanation for the severe hypoglycemia, hospitalization will be requested. Endocrinology consultation is recommended. The planned pituitary resection could have potential complications of adrenal insufficiency, diabetes insipidus, or SIADH. Sleep apnea evaluation recommended

## 2017-11-07 NOTE — ED Triage Notes (Signed)
Patient is a resident at [facility] for rehab. After a fall with leg weakness. Staff weren't able to arouse her this am. CBG was 28 patient was given   Gluco gon,  Glucose gel and D50 W. Upon arrival patient is at her baseline, use wanted to be checked.

## 2017-11-08 ENCOUNTER — Non-Acute Institutional Stay (SKILLED_NURSING_FACILITY): Payer: Medicare Other | Admitting: Adult Health

## 2017-11-08 ENCOUNTER — Ambulatory Visit: Payer: Medicare Other

## 2017-11-08 ENCOUNTER — Ambulatory Visit: Payer: Medicaid Other

## 2017-11-08 ENCOUNTER — Encounter: Payer: Self-pay | Admitting: Adult Health

## 2017-11-08 DIAGNOSIS — N939 Abnormal uterine and vaginal bleeding, unspecified: Secondary | ICD-10-CM

## 2017-11-08 DIAGNOSIS — E162 Hypoglycemia, unspecified: Secondary | ICD-10-CM

## 2017-11-08 DIAGNOSIS — R400 Somnolence: Secondary | ICD-10-CM | POA: Diagnosis not present

## 2017-11-08 DIAGNOSIS — R930 Abnormal findings on diagnostic imaging of skull and head, not elsewhere classified: Secondary | ICD-10-CM

## 2017-11-08 DIAGNOSIS — D72829 Elevated white blood cell count, unspecified: Secondary | ICD-10-CM | POA: Diagnosis not present

## 2017-11-08 DIAGNOSIS — J45909 Unspecified asthma, uncomplicated: Secondary | ICD-10-CM | POA: Diagnosis not present

## 2017-11-08 NOTE — Assessment & Plan Note (Addendum)
Remotely Dr. Janean Sark, Pulmonologist had planned sleep apnea evaluation ,but this was not completed prior to his death Evaluation for possible sleep apnea would be advisable prior to the planned pituitary mass resection

## 2017-11-08 NOTE — Progress Notes (Signed)
    NURSING HOME LOCATION:  [facility] ROOM NUMBER:  108-A  CODE STATUS:  Full Code  PCP:  [name], MD  [place] [id number]  This is a nursing facility follow up for specific acute issue of severe , unexplained hypoglycemia ( glucose 28).  Interim medical record and care since last [place] visit was updated with review of diagnostic studies and change in clinical status since last visit were documented.  HPI: She was sent to the emergency room yesterday morning after she was found to have a glucose of 28 with obtundation & dramatic snoring. This did respond to D5W and glucagon with resolution of the obtundation. In ED WBC was 19.3 (no dif performed); patient afebrile. UA revealed small leukocytes & RBC TMTC (hx of vaginal spotting & clotting). Keflex was initiated for possible UTI (no C&S ordered). She has PMH PCN allergy as hives Glucose was 133. She was told in the ED that she just hadn't eaten enough. The night before the episode she had a bedtime snack. The morning of the event she did not receive any diabetic medicines. She denies any prior hypoglycemic spells. In reference to her vaginal bleeding and spotting, she states this has occurred intermittently over the last 8 years. It has been attributed to fibroid tumors. There was no mention by her Gynecologist of any hormonal abnormalities.  She was also evaluated for sleep apnea because of severe snoring over a decade ago but her Pulmonologist/allergist died before work up completed. She states that she's had the excessive somnolence for approximately one year She will fall asleep when she gets quiet, day or night.  ROS at this time is negative except for dry itchy skin. She also has had intermittent constipation as well as loose stool.No GU symptoms.  Review of systems: Constitutional: No fever, significant weight change, fatigue  Eyes: No redness, discharge, pain, vision change ENT/mouth: No nasal  congestion,  purulent discharge, earache, change in hearing, sore throat  Respiratory: No cough, sputum production   Gastrointestinal: No heartburn, dysphagia, abdominal pain, nausea /vomiting, rectal bleeding, melena Genitourinary: No dysuria,visable hematuria, pyuria, incontinence, nocturia Endocrine: No new change in hair/skin/ nails, excessive thirst, excessive hunger, excessive urination   Physical exam:  Pertinent or positive findings:She has a grade 1 systolic murmur. She has coarse rhonchi in all lung fields. Abdomen is markedly protuberant. The skin over her extremities is dry and she has fine superficial excoriations. The lower legs exhibit "pachyderm" skin changes with thick, raised keloids. Pedal pulses are decreased. She has thickened deformed toenails.   General appearance: Adequately nourished; no acute distress, increased work of breathing is present.   Lymphatic: No lymphadenopathy about the head, neck, axilla. Eyes: No conjunctival inflammation or lid edema is present. There is no scleral icterus. Ears:  External ear exam shows no significant lesions or deformities.   Neck:  No thyromegaly, masses, tenderness noted.    Heart:  Normal rate and regular rhythm. S1 and S2 normal without gallop,click, rub .  Abdomen:Bowel sounds are normal. Abdomen is soft and nontender with no organomegaly, hernias,masses. GU: deferred  Extremities:  No cyanosis, clubbing Skin: Warm & dry w/o tenting. No significant rash.  See summary under each active problem in the Problem List with associated updated therapeutic plan

## 2017-11-08 NOTE — Assessment & Plan Note (Addendum)
We do not have an adequate explanation for the glucose of 28 on 4/16 as she had not received Glipizide that morning & had QHS snack 4/15  I doubt insulinoma as no apparent recurrent hypoglycemia  Glipizide D/Ced

## 2017-11-09 ENCOUNTER — Telehealth: Payer: Self-pay

## 2017-11-09 ENCOUNTER — Other Ambulatory Visit: Payer: Self-pay | Admitting: Student in an Organized Health Care Education/Training Program

## 2017-11-09 ENCOUNTER — Other Ambulatory Visit: Payer: Self-pay | Admitting: Internal Medicine

## 2017-11-09 DIAGNOSIS — I10 Essential (primary) hypertension: Principal | ICD-10-CM

## 2017-11-09 DIAGNOSIS — E1159 Type 2 diabetes mellitus with other circulatory complications: Secondary | ICD-10-CM

## 2017-11-09 DIAGNOSIS — E119 Type 2 diabetes mellitus without complications: Secondary | ICD-10-CM

## 2017-11-09 NOTE — Telephone Encounter (Signed)
Called pt and offered appt for 04/22.  Pt accepted and confirmed. Advised arrival time @3 :00pm, bring insurance card, meds or med list if current and address. Pt verbalized understanding.

## 2017-11-09 NOTE — Patient Instructions (Signed)
See assessment and plan under each diagnosis in the problem list and acutely for this visit Total time 42  minutes; greater than 50% of the visit spent counseling patient and coordinating care for problems addressed at this encounter  

## 2017-11-09 NOTE — Assessment & Plan Note (Signed)
Change ACE-I to ARB due to potential exacerbation of pulmonary condition

## 2017-11-09 NOTE — Assessment & Plan Note (Signed)
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 Additionally she has abnormal spotting and bleeding at 67 and as yet unexplained severe hypoglycemia X1 R/O Multiple Endocrine Neoplasia Syndrome Endocrinology consultation pre op

## 2017-11-13 ENCOUNTER — Ambulatory Visit (INDEPENDENT_AMBULATORY_CARE_PROVIDER_SITE_OTHER): Payer: Medicare Other | Admitting: Internal Medicine

## 2017-11-13 ENCOUNTER — Encounter: Payer: Self-pay | Admitting: Internal Medicine

## 2017-11-13 ENCOUNTER — Other Ambulatory Visit: Payer: Self-pay | Admitting: *Deleted

## 2017-11-13 VITALS — BP 134/76 | HR 102 | Ht 61.0 in | Wt 215.6 lb

## 2017-11-13 DIAGNOSIS — D352 Benign neoplasm of pituitary gland: Secondary | ICD-10-CM | POA: Diagnosis not present

## 2017-11-13 NOTE — Progress Notes (Signed)
Patient ID: Mariah [name], female   DOB: [birth year], 67 y.o.   MRN: [id number]    HPI  Mariah [name] is a 67 y.o.-year-old female, referred by her PCP, Dr. 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  Pt. has been dx with a pituitary adenoma in 09/2017 after having a VF checked by Dr. Katy Fitch >> she then had a pituitary MRI >> showed a macroadenoma. She was referred to neurosurgery (Dr. Kathyrn Sheriff).  Transsphenoidal surgery scheduled for 12/22/2017.  She was seen by Dr. Linna Darner after she was admitted to the hospital after a fall. He admitted her to the SNF.   10/03/2017: Pituitary MRI [facility]): There is moderately large 2.4 x 3.3 x 2.7 cm mass in the sella and suprasellar region. Some uplifting of the structures of the brain base attenuation in the round carotid vessels and circle of Willis vessels as above. Extension towards the left orbit and  left middle skull base. This is likely a large, insinuated adenoma.  11/01/2017: Pituitary MRI to rule out apoplexy: SELLA: Expansile sellar/suprasellar 1.8 by 2.8 by 3.1 cm mass is relatively unchanged. Mass demonstrates intermediate T2-2, low T1 homogeneous enhancement with reduced diffusion. No susceptibility artifact. Persistent LEFT greater than RIGHT cavernous sinus invasion. Tenting of the optic chiasm and A1-2 junction. No dural tail.  I reviewed pt's pertinent tests: Component     Latest Ref Rng & Units 10/31/2017  FSH     mIU/mL 8.3  LH     mIU/mL 9.3  Prolactin     4.8 - 23.3 ng/mL 29.6 (H)  TSH     0.350 - 4.500 uIU/mL 2.749  Somatomedin C     42 - 169 ng/mL 96  T4,Free(Direct)     0.61 - 1.12 ng/dL 1.17 (H)   Pt c/o: - weight gain - hair loss  She denies: - Headaches - Visual problems - Fatigue  - Constipation  Of note, patient also has type 2 diabetes and was on sulfonylurea.  She presented to the hospital on 11/07/2017 with a glucose of 28.  Of note, at that time, she was on glipizide 20  mg in the morning, which, however, was not given the day of the admission.  Since then, sulfonylurea was stopped and she did not have any more hypoglycemic episodes.  No previous history of hypoglycemic episodes.  Reviewing her chart, she also has a history of encephalopathy after falling from a chair on 10/30/2017.   She has a history of hypothyroidism and is on replacement with levothyroxine 100 mcg daily.  Latest TFTs were normal in 03/2017.  She had thyroid nodules for which a biopsy was ordered by PCP.  There is no history of hypercalcemia, nephrolithiasis, GI tumors, peptic ulcer disease, diarrhea, galactorrhea.  Reviewed previous calcium levels. No PTH available. Lab Results  Component Value Date   CALCIUM 8.8 (L) 11/07/2017   CALCIUM 8.4 (L) 11/01/2017   CALCIUM 8.6 (L) 10/31/2017   CALCIUM 9.0 10/30/2017   CALCIUM 9.5 09/01/2017   CALCIUM 9.1 07/28/2017   CALCIUM 9.0 02/06/2016   CALCIUM 9.4 12/28/2013   CALCIUM 8.7 10/21/2012   CALCIUM 9.0 10/20/2012   CALCIUM 9.3 08/30/2012   CALCIUM 9.6 06/03/2010   CALCIUM 9.7 10/02/2008   CALCIUM 9.3 07/10/2008   No vitamin D levels available for review: No results found for: VD25OH   She has a history of diabetes.  Last HbA1c: Lab Results  Component Value Date   HGBA1C 8.2 09/01/2017  She has a history of hypertension.  ROS: Constitutional: + Weight gain, no fatigue, no subjective hyperthermia/hypothermia, + nocturia Eyes: no blurry vision, no xerophthalmia ENT: no sore throat, + nodules palpated in neck no dysphagia/odynophagia, no hoarseness Cardiovascular: no CP/SOB/palpitations/+ leg swelling Respiratory: no cough/SOB/+ wheezing Gastrointestinal: no N/V/D/C/+ acid reflux Musculoskeletal: + Both: Muscle/joint aches Skin: no rashes, + weight loss Neurological: no tremors/numbness/tingling/dizziness Psychiatric: no depression/anxiety  Past Medical History:  Diagnosis Date  . Allergic rhinitis   . Arthritis     "ankles, feet, knees" (10/30/2017)  . Asthma   . Bursitis   . Carpal tunnel syndrome   . GERD (gastroesophageal reflux disease)   . HTN (hypertension)   . Migraine    "a couple/yr" (10/30/2017)  . Obesity   . Pneumonia 1960s X 1  . Sciatica   . Tendinitis   . Type II diabetes mellitus ([place])    Past Surgical History:  Procedure Laterality Date  . GANGLION CYST EXCISION Right   . KNEE ARTHROSCOPY Left 2003  . PLANTAR FASCIA RELEASE Right 1995   "heel"  . TONSILLECTOMY  1958  . UTERINE FIBROID SURGERY  1988; 1997   Dr [name]   Social History   Socioeconomic History  . Marital status: Single    Spouse name: Not on file  . Number of children: 0  . Years of education: Not on file  . Highest education level: Not on file  Occupational History  . 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 Financial resource strain: Not on file  . Food insecurity:    Worry: Not on file    Inability: Not on file  . Transportation needs:    Medical: Not on file    Non-medical: Not on file  Tobacco Use  . Smoking status: Never Smoker  . Smokeless tobacco: Never Used  Substance and Sexual Activity  . Alcohol use: No  . Drug use: No  . Sexual activity: Not Currently    Birth control/protection: None  Lifestyle  . Physical activity:    Days per week: Not on file    Minutes per session: Not on file  . Stress: Not on file  Relationships  . Social connections:    Talks on phone: Not on file    Gets together: Not on file    Attends religious service: Not on file    Active member of club or organization: Not on file    Attends meetings of clubs or organizations: Not on file    Relationship status: Not on file  . Intimate partner violence:    Fear of current or ex partner: Not on file    Emotionally abused: Not on file    Physically abused: Not on file    Forced sexual activity: Not on file  Other Topics Concern   . 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 albuterol (PROVENTIL HFA;VENTOLIN HFA) 108 (90 Base) MCG/ACT inhaler Inhale 2 puffs into the lungs every 6 (six) hours as needed for wheezing or shortness of breath.    [name] amLODipine (NORVASC) 10 MG tablet Take 1 tablet (10 mg total) by mouth daily. 30 tablet 5  .  cephALEXin (KEFLEX) 500 MG capsule Take 1 capsule (500 mg total) by mouth 4 (four) times daily. 20 capsule 0  . esomeprazole (NEXIUM) 20 MG capsule Take 1 capsule (20 mg total) by mouth every morning. For acid reflex 90 capsule 2  . Fluticasone-Salmeterol (ADVAIR DISKUS) 100-50 MCG/DOSE AEPB Inhale 1 puff into the lungs 2 (two) times daily. 1 each 0  . furosemide (LASIX) 40 MG tablet Take 40 mg by mouth.     [name] lisinopril (PRINIVIL,ZESTRIL) 40 MG tablet Take 1 tablet by mouth daily.    . mometasone (NASONEX) 50 MCG/ACT nasal spray Place 1 spray into the nose as needed.     . sitaGLIPtin-metformin (JANUMET) 50-1000 MG tablet Take 1 tablet by mouth 2 (two) times daily with a meal. 60 tablet 2  . Menthol, Topical Analgesic, (BIOFREEZE EX) Apply 1 application topically 2 (two) times daily as needed (For arthritis pain). Apply to bilateral knees BID PRN    . naproxen (NAPROSYN) 500 MG tablet Take 1 tablet (500 mg total) by mouth 2 (two) times daily with a meal. (Patient not taking: Reported on 11/13/2017) 60 tablet 2   No current facility-administered medications on file prior to visit.    Allergies  Allergen Reactions  . Codeine Nausea And Vomiting  . Doxycycline Itching and Nausea And Vomiting  . Indomethacin Diarrhea  . Penicillins Hives   Family History  Problem Relation Age of Onset  . Uterine cancer Mother   . Hypertension Mother   . Heart disease Mother   . Hypertension Father   .  Alzheimer's disease Father   . Cancer Other     PE: BP 134/76   Pulse (!) 102   Ht 5\' 1"  (1.549 m)   Wt 215 lb 9.6 oz (97.8 kg)   LMP 04/08/2013 Comment: spotting & clotting; Gyn F/U indicated  SpO2 99%   BMI 40.74 kg/m  Wt Readings from Last 3 Encounters:  11/13/17 215 lb 9.6 oz (97.8 kg)  11/08/17 217 lb (98.4 kg)  11/07/17 217 lb (98.4 kg)   Constitutional: overweight, in NAD Eyes: PERRLA, EOMI, no exophthalmos ENT: moist mucous membranes, no thyromegaly, no cervical lymphadenopathy Cardiovascular: tachycardia, RR, No MRG Respiratory: CTA B Gastrointestinal: abdomen soft, NT, ND, BS+ Musculoskeletal: no deformities, strength intact in all 4 Skin: moist, warm, no rashes Neurological: no tremor with outstretched hands, DTR normal in all 4  ASSESSMENT: 1. Pituitary macroadenoma  PLAN:  1. Patient with a newly diagnosed pituitary macroadenoma, incidentally found during investigation for headaches.  - I reviewed the latest pituitary MRI report and images along with the patient and her friend. I explained that, since the tumor is >1 cm, this qualifies as a macro-, rather than a micro-adenoma.  - I explained that this is not a "brain tumor". These tumors are extremely rarely malignant, the vast majority of them are benign.  - We discussed about the fact that the pituitary adenoma can be:  Not producing hormones, not compressing the pituitary gland or the optic chiasm  Not producing hormones, but compressing either the pituitary gland (causing hypopituitarism) or the optic chiasm (causing visual field cuts)  Producing hormones: - Prolactin (prolactinoma) -a previous prolactin level was only slightly elevated, which can be due to stalk effect from the macroadenoma, however, it could be 2/2 hook effect, so today we will check a neat and diluted prolactin level.  If the tumor is indeed a prolactinoma, she would benefit from dopamine agonist therapy rather than surgery.  Even large  prolactinomas with compression of optic chiasm are amenable to cabergoline. - ACTH (Cushing's disease) - she has weight gain but she does not have central distribution of fat, wide, purple, stretch marks, full supraclavicular fat pads - growth hormone (acromegaly) - an IGF-I level was normal. - LH or FSH (gonadotropin secreting tumor) -her LH and FSH levels were actually lower than expected from age, most likely due to mass-effect from the tumor - TSH (TSH secreting tumor) (very rare) - recent TSH has been normal, in the context of treatment for primary hypothyroidism. - She already has transsphenoidal surgery scheduled for next month. - I ordered the following labs -she will return to have them done between 8-9 AM: Orders Placed This Encounter  Procedures  . PTH, intact and calcium  . VITAMIN D 25 Hydroxy (Vit-D Deficiency, Fractures)  . ACTH  . Cortisol  . PROLACTIN W/DILUTION  - My concern for MEN 1 syndrome is not high, in the absence of hypercalcemia/hyperparathyroidism and also other signs of GI tumors/Zollinger-Ellison syndrome.  I do not feel that the hypoglycemia event was related to a possible insulinoma due to the fact that this was a unique event developed during treatment with sulfonylureas. She does not have a family history of an MEN syndrome.  I cannot check a chromogranin/gastrin today for her since she is on a PPI, but we will check a PTH and calcium level.  I will add a vitamin D level just in case PTH is high in the presence of a normal calcium. - Since she has a large pituitary adenoma that is tenting the optic chiasm, she needs to have surgery, with the only exception of a large prolactinoma, for which the primary treatment, would still be dopamine agonist. - I explained that there are 3 types of pituitary surgeries:  Transsphenoidal nasal (most commonly used) -already scheduled for her  Transsphenoidal sublabial - under upper lip  Open craniotomy (last resort) Return in  about 2 months, ~ 1 mo postop.  Component     Latest Ref Rng & Units 11/30/2017  Calcium     8.7 - 10.3 mg/dL 9.1  PTH, Intact     15 - 65 pg/mL 20  PROLACTIN,UNDILUTED     2.0 - 30.0 ng/mL 22.0  PROLACTIN,DILUTED     ng/mL   Cortisol, Plasma     ug/dL 9.8  C206 ACTH     6 - 50 pg/mL 42  VITD     30.00 - 100.00 ng/mL 13.65 (L)   Prolactin level both neat and diluted were not elevated.  In this case, the tumor is not a prolactinoma and needs to be resected.   Cortisol and ACTH normal. Calcium normal, vitamin D very low, so we will start 5000 units vitamin D daily and I will recheck her vitamin D level at next visit.  Philemon Kingdom, MD PhD [facility]

## 2017-11-13 NOTE — Patient Instructions (Addendum)
Please come back between 8-9 am for labs.  Return to see me ~1 month after your surgery.  Pituitary Tumors Pituitary tumors are abnormal growths found in the pituitary gland. The pituitary gland is a small organ-about the size of a dime-located in the center of the brain. It makes hormones that affect growth and the functions of other glands in the body. Most pituitary tumors are benign. This means they are noncancerous. They grow slowly and do not spread to other parts of the body. A pituitary tumor may make the pituitary gland produce too many hormones. Tumors that make hormones are called functioning tumors (those that do not make hormones are called nonfunctioning tumors). Problems that can be caused by pituitary tumors include:  Cushing disease. This disease causes fat to build up in the face, back, and chest while the arms and legs become thin.  Acromegaly. This is a condition in which the hands, feet, and face are larger than normal.  Breast milk production even though there is no pregnancy.  What are the causes? The cause of most pituitary tumors is not known. In some cases, these kinds of tumors run in a family. What increases the risk? Some cases of pituitary tumors are due to genetic factors that a person inherits that increase the likelihood of developing certain tumors, including pituitary tumors. What are the signs or symptoms?  Headaches.  Vision problems.  Weakness or low energy.  Clear fluid draining from the nose.  Changes in the sense of smell.  Feeling sick to your stomach (nauseous) and vomiting.  Problems caused by the production of too many hormones, such as: ? Infertility. ? Loss of menstrual periods in women. ? Abnormal growth. ? High blood pressure (hypertension). ? Heat or cold intolerance. ? Other skin and body changes. ? Nipple discharge. ? Decreased sexual function. How is this diagnosed? If you develop symptoms, you will be sent for a CT scan  or MRI to look for pituitary tumors. If you know that these kinds of tumors run in your family, you may need to have your blood tested regularly to monitor pituitary hormone levels. How is this treated? These tumors are best treated when they are found and diagnosed early. Treatments include:  Surgical removal of the tumor. This is the most common treatment.  Radiation therapy. During this treatment, high doses of X-rays are used to kill tumor cells.  Drug therapy. This involves using certain medicines to block the pituitary gland from producing too many hormones.  Follow these instructions at home:  Drink plenty of fluids.  Measure your urine output if directed to do so by your health care provider.  Do not pick your nose or remove any crusting.  Do not do any activities that require straining.  Take all medicines as directed by your health care provider.  Keep follow-up appointments as directed by your health care provider. Contact a health care provider if:  You have sudden, unusual thirst.  You are urinating frequently.  You have a headache that will not go away.  You have new vision changes.  You notice clear fluid leaking from your nose or ears, a sensation of fluid trickling down the back of your throat, or a salty taste in your mouth.  You are having trouble concentrating. Get help right away if:  Your symptoms suddenly become severe.  You have a nosebleed that does not stop after a few minutes.  You have a fever over 101F (38.3C).  You have  a severe headache or a stiff neck.  You are confused or not as alert as usual.  You have chest pain or shortness of breath. This information is not intended to replace advice given to you by your health care provider. Make sure you discuss any questions you have with your health care provider. Document Released: 07/01/2002 Document Revised: 12/17/2015 Document Reviewed: 01/11/2013 Elsevier Interactive Patient Education   2018 [employer].  Transsphenoidal Pituitary Tumor Resection A transsphenoidal pituitary tumor resection is the surgical removal of a tumor located on the pituitary gland. The pituitary gland is a pea-sized gland that sits in the base of the skull behind the bridge of the nose. It controls important functions in the body, including the production of hormones. Pituitary tumors are rarely cancerous, and many can be treated with medicines. However, a tumor may need to be removed if it causes the overproduction of certain hormones that cannot be controlled with medicine. A tumor may also be removed if it is pressing on nearby structures. In a transsphenoidal pituitary tumor resection, the tumor is accessed and removed through the nose and the hollow area behind the nose (sphenoid sinus). The surgery usually takes about 3 hours. Tell a health care provider about:  Any allergies you have.  All medicines you are taking, including vitamins, herbs, eye drops, creams, and over-the-counter medicines.  Any problems you or family members have had with anesthetic medicines.  Any blood disorders you have.  Any surgeries you have had.  Any medical conditions you have. What are the risks? Generally, transsphenoidal pituitary tumor resection is a safe procedure. However, as with any procedure, problems can occur. Possible problems include:  Reaction to anesthetics.  Damage to surrounding nerves, tissues, or structures.  Infection.  Blood clot.  Bleeding.  Scarring.  The following complications are possible but rare:  Leakage of cerebrospinal fluid.  Vision loss.  Damage to the pituitary gland.  Temporary diabetes insipidus.  Meningitis infection.  Chronic sinus congestion.  Nasal deformity.  Nasal bleeding.  Stroke.  What happens before the procedure?  Ask your health care provider about changing or stopping your regular medicines. This is especially important if you are  taking diabetes medicines or blood thinners.  Do noteat or drink anything after midnight on the night before the procedure or as directed by your health care provider. Ask your health care provider if it is okay to have a sip of water with any needed medicine.  Shower the morning of surgery with antibacterial soap. Wear clean, loose-fitting clothing.  Avoid wearing makeup, jewelry, and hair accessories.  Stop smoking if you smoke. Stopping will improve the healing process after surgery.  Make plans to have someone drive you home after your hospital stay. Also, arrange to have someone help you with activities during recovery. What happens during the procedure?  You will be given medicine to make you sleep (general anesthetic). This medicine is given through an IV access tube in one of your veins.  Your face and nasal cavity will be cleaned with an antiseptic solution to prevent germs.  Your heart rate and blood pressure will be monitored throughout the surgery. In some cases, the surgeon may use MRI scans for guidance throughout the surgery.  Sometimes a cut (incision) is made under the lip or near the nose, but the surgery is often performed solely through the nasal cavity (endonasal). The surgeon uses tubes with tiny tools attached. The tubes are placed through a nostril. The surgeon makes a  hole through the bone at the back of the nose into the bottom of the skull. From there, the surgeon can reach the pituitary gland and remove the tumor in pieces.  Once the tumor is removed, the surgeon examines the area for any other abnormalities or additional tumor growth.  Fat tissue is sometimes taken from the abdomen to fill the empty space left behind by the tumor removal.  The base of the skull is closed using a patch made of bone tissue or a screen-like material (mesh). Soft splints are placed in the nostrils to allow for drainage after surgery. What happens after the procedure? After  surgery, you will be taken to a recovery area where you will be closely monitored. Once you are awake and stable, you will be moved to a hospital room. A hospital stay of 1 or 2 days is common after this surgery. This information is not intended to replace advice given to you by your health care provider. Make sure you discuss any questions you have with your health care provider. Document Released: 05/01/2013 Document Revised: 12/17/2015 Document Reviewed: 02/27/2013 Elsevier Interactive Patient Education  [employer].

## 2017-11-16 ENCOUNTER — Non-Acute Institutional Stay (SKILLED_NURSING_FACILITY): Payer: Medicare Other | Admitting: Adult Health

## 2017-11-16 ENCOUNTER — Encounter: Payer: Self-pay | Admitting: Adult Health

## 2017-11-16 DIAGNOSIS — R930 Abnormal findings on diagnostic imaging of skull and head, not elsewhere classified: Secondary | ICD-10-CM | POA: Diagnosis not present

## 2017-11-16 DIAGNOSIS — G8929 Other chronic pain: Secondary | ICD-10-CM | POA: Diagnosis not present

## 2017-11-16 DIAGNOSIS — K219 Gastro-esophageal reflux disease without esophagitis: Secondary | ICD-10-CM | POA: Diagnosis not present

## 2017-11-16 DIAGNOSIS — R400 Somnolence: Secondary | ICD-10-CM | POA: Diagnosis not present

## 2017-11-16 DIAGNOSIS — R5381 Other malaise: Secondary | ICD-10-CM | POA: Diagnosis not present

## 2017-11-16 DIAGNOSIS — J45909 Unspecified asthma, uncomplicated: Secondary | ICD-10-CM

## 2017-11-16 DIAGNOSIS — M25561 Pain in right knee: Secondary | ICD-10-CM

## 2017-11-16 DIAGNOSIS — E1159 Type 2 diabetes mellitus with other circulatory complications: Secondary | ICD-10-CM | POA: Diagnosis not present

## 2017-11-16 DIAGNOSIS — E119 Type 2 diabetes mellitus without complications: Secondary | ICD-10-CM | POA: Diagnosis not present

## 2017-11-16 DIAGNOSIS — M25562 Pain in left knee: Secondary | ICD-10-CM

## 2017-11-16 DIAGNOSIS — I10 Essential (primary) hypertension: Secondary | ICD-10-CM | POA: Diagnosis not present

## 2017-11-16 MED ORDER — FLUTICASONE-SALMETEROL 100-50 MCG/DOSE IN AEPB: 1.0000 | INHALATION_SPRAY | Freq: Two times a day (BID) | RESPIRATORY_TRACT | 0 refills | Status: DC

## 2017-11-16 MED ORDER — ESOMEPRAZOLE MAGNESIUM 20 MG PO CPDR: 20.0000 mg | DELAYED_RELEASE_CAPSULE | Freq: Every morning | ORAL | 0 refills | Status: DC

## 2017-11-16 MED ORDER — MOMETASONE FUROATE 50 MCG/ACT NA SUSP: 1.0000 | NASAL | 0 refills | Status: DC | PRN

## 2017-11-16 MED ORDER — LOSARTAN POTASSIUM 100 MG PO TABS: 100.0000 mg | ORAL_TABLET | Freq: Every day | ORAL | 0 refills | Status: DC

## 2017-11-16 MED ORDER — AMLODIPINE BESYLATE 10 MG PO TABS: 10.0000 mg | ORAL_TABLET | Freq: Every day | ORAL | 0 refills | Status: DC

## 2017-11-16 NOTE — Progress Notes (Signed)
Location:  [name]: 108-A Place of Service:  SNF (31) Provider:  Durenda Age, NP  Patient Care Team: [name], MD as PCP - General (Internal Medicine)  Extended Emergency Contact Information Primary Emergency Contact: [name],[name] Address: [address]          [name], [place] Phone: [phone] Relation: Friend  Code Status:  Full Code  Goals of care: Advanced Directive information Advanced Directives 10/30/2017  Does Patient Have a Medical Advance Directive? No  Would patient like information on creating a medical advance directive? No - Patient declined  Pre-existing out of facility DNR order (yellow form or pink MOST form) -     Chief Complaint  Patient presents with  . Discharge Note    Patient to discharge home with home health services on 11/16/17    HPI:  Pt is a 67 y.o. female seen today for discharge. She will discharge home with Home health PT and OT.   She has Been admitted to [facility] on 11/02/17 from [facility] on 11/02/17  For acute encephalopathy. 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 She has a scheduled appointment for surgery on May 31st. She self consulted neurosurgery. They had low suspicion for apoplexy.  She was recently transferred to the hospital day after admission due to CBG 28. She came back to facility and has reported to go out everyday. She left the facility yesterday morning and and came back @ 4:11 PM. 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 She has used "drive and go" ride to get out of facility. She was told to follow-up with Dr. [name], endocrinologist, whom she recently consulted, 11/13/17     Past Medical History:  Diagnosis Date  . Allergic rhinitis   . Arthritis    "ankles, feet, knees" (10/30/2017)  . Asthma     . Bursitis   . Carpal tunnel syndrome   . GERD (gastroesophageal reflux disease)   . HTN (hypertension)   . Migraine    "a couple/yr" (10/30/2017)  . Obesity   . Pneumonia 1960s X 1  . Sciatica   . Tendinitis   . Type II diabetes mellitus ([place])    Past Surgical History:  Procedure Laterality Date  . GANGLION CYST EXCISION Right   . KNEE ARTHROSCOPY Left 2003  . PLANTAR FASCIA RELEASE Right 1995   "heel"  . TONSILLECTOMY  1958  . [place]; 1997   Dr [name]    Allergies  Allergen Reactions  . Codeine Nausea And Vomiting  . Doxycycline Itching and Nausea And Vomiting  . Indomethacin Diarrhea  . Penicillins Hives    Outpatient Encounter Medications as of 11/16/2017  Medication Sig  . albuterol (PROVENTIL HFA;VENTOLIN HFA) 108 (90 Base) MCG/ACT inhaler Inhale 2 puffs into the lungs every 6 (six) hours as needed for wheezing or shortness of breath.  [name] amLODipine (NORVASC) 10 MG tablet Take 1 tablet (10 mg total) by mouth daily.  . cephALEXin (KEFLEX) 500 MG capsule Take 1 capsule (500 mg total) by mouth 4 (four) times daily.  [name] esomeprazole (NEXIUM) 20 MG capsule Take 1 capsule (20 mg total) by mouth every morning. For acid reflex  . Fluticasone-Salmeterol (ADVAIR DISKUS) 100-50 MCG/DOSE AEPB Inhale 1 puff into the lungs 2 (two) times daily.  . furosemide (LASIX)  40 MG tablet Take 40 mg by mouth.   [name] lisinopril (PRINIVIL,ZESTRIL) 40 MG tablet Take 1 tablet by mouth daily.  . Menthol, Topical Analgesic, (BIOFREEZE EX) Apply 1 application topically 2 (two) times daily as needed (For arthritis pain). Apply to bilateral knees BID PRN  . mometasone (NASONEX) 50 MCG/ACT nasal spray Place 1 spray into the nose as needed.   . naproxen (NAPROSYN) 500 MG tablet Take 1 tablet (500 mg total) by mouth 2 (two) times daily with a meal. (Patient not taking: Reported on 11/13/2017)  . sitaGLIPtin-metformin (JANUMET) 50-1000 MG tablet Take 1 tablet by mouth 2 (two) times daily  with a meal.   No facility-administered encounter medications on file as of 11/16/2017.     Review of Systems  GENERAL: No change in appetite, no fatigue, no weight changes, no fever, chills or weakness MOUTH and THROAT: Denies oral discomfort, gingival pain or bleeding, pain from teeth or hoarseness   RESPIRATORY: no cough, SOB, DOE, wheezing, hemoptysis CARDIAC: No chest pain, or palpitations GI: No abdominal pain, diarrhea, constipation, heart burn, nausea or vomiting GU: Denies dysuria, frequency, hematuria, incontinence, or discharge PSYCHIATRIC: Denies feelings of depression or anxiety. No report of hallucinations, insomnia, paranoia, or agitation   Immunization History  Administered Date(s) Administered  . Influenza Split 03/08/2013  . Influenza Whole 09/03/2009, 06/03/2010  . Influenza,inj,Quad PF,6+ Mos 06/25/2014  . Influenza-Unspecified 06/08/2017  . Pneumococcal Polysaccharide-23 07/25/2002, 10/31/2017  . Td 07/25/2005   Pertinent  Health Maintenance Due  Topic Date Due  . OPHTHALMOLOGY EXAM  09/02/1962  . PAP SMEAR  09/02/1973  . COLONOSCOPY  09/02/2002  . MAMMOGRAM  01/14/2017  . DEXA SCAN  09/02/2017  . HEMOGLOBIN A1C  11/29/2017  . INFLUENZA VACCINE  02/22/2018  . FOOT EXAM  09/01/2018  . PNA vac Low Risk Adult (2 of 2 - PCV13) 11/01/2018   Fall Risk  10/20/2017 09/01/2017 08/15/2017 07/28/2017 07/14/2017  Falls in the past year? Yes Yes Yes Yes Yes  Number falls in past yr: 2 or more 2 or more 1 2 or more 2 or more  Comment - 7 times  - - -  Injury with Fall? Yes No No Yes Yes  Comment Broken jaw - - - -  Risk Factor Category  High Fall Risk High Fall Risk High Fall Risk High Fall Risk High Fall Risk  Risk for fall due to : History of fall(s);Impaired mobility;Impaired balance/gait Impaired balance/gait;Impaired mobility Impaired balance/gait;Impaired mobility History of fall(s);Medication side effect;Impaired mobility -  Follow up - Falls prevention discussed  Education provided Falls prevention discussed -   Functional Status Survey:    Vitals:   11/16/17 1631  Weight: 215 lb 9.6 oz (97.8 kg)  Height: 5\' 1"  (1.549 m)   Body mass index is 40.74 kg/m.  Physical Exam  GENERAL APPEARANCE: Well nourished. In no acute distress. Morbidly obese SKIN:  Multiple small tumors on left shin, no open wound, non-tender MOUTH and THROAT: Lips are without lesions. Oral mucosa is moist and without lesions. Tongue is normal in shape, size, and color and without lesions NECK: supple, trachea midline, no neck masses, no thyroid tenderness, no thyromegaly LYMPHATICS: No LAN in the neck, no supraclavicular LAN RESPIRATORY: Breathing is even & unlabored, BS CTAB CARDIAC: RRR, no murmur,no extra heart sounds, BLE2+ edema GI: Abdomen soft, normal BS, no masses, no tenderness, no hepatomegaly, no splenomegaly EXTREMITIES:  Able to move X 4 extremities, uses walker when ambulating PSYCHIATRIC: Alert and oriented X 3.  Affect and behavior are appropriate  Labs reviewed: Recent Labs    10/31/17 0504 11/01/17 0655 11/07/17 1212  NA 139 137 137  K 3.7 3.5 3.9  CL 106 103 105  CO2 24 24 22   GLUCOSE 88 124* 81  BUN 11 10 17   CREATININE 0.93 0.89 0.77  CALCIUM 8.6* 8.4* 8.8*   Recent Labs    10/30/17 1304 11/07/17 1212  AST 18 23  ALT 15 20  ALKPHOS 82 76  BILITOT 0.5 0.7  PROT 8.3* 8.5*  ALBUMIN 3.1* 3.3*   Recent Labs    09/01/17 1517 10/30/17 1304 11/07/17 1212  WBC 11.6* 13.8* 19.3*  HGB 12.1 11.9* 13.2  HCT 38.0 37.8 41.0  MCV 91 90.4 90.3  PLT 429* 340 413*   Lab Results  Component Value Date   TSH 2.749 10/31/2017   Lab Results  Component Value Date   HGBA1C 8.2 09/01/2017   Lab Results  Component Value Date   CHOL 178 08/30/2012   HDL 49 08/30/2012   LDLCALC 92 08/30/2012   TRIG 187 (H) 08/30/2012   CHOLHDL 3.6 08/30/2012    Significant Diagnostic Results in last 30 days:  Dg Chest 2 View  Result Date:  10/30/2017 CLINICAL DATA:  67 year old female with shortness of breath. EXAM: CHEST - 2 VIEW COMPARISON:  Chest radiographs 07/17/2016 and earlier. FINDINGS: Lung volumes are stable and within normal limits. Mediastinal contours remain normal. Visualized tracheal air column is within normal limits. No pneumothorax, pulmonary edema, pleural effusion or confluent pulmonary opacity. No acute osseous abnormality identified. Negative visible bowel gas pattern. IMPRESSION: No acute cardiopulmonary abnormality. Electronically Signed   By: Genevie Ann M.D.   On: 10/30/2017 16:46   Ct Head Wo Contrast  Result Date: 10/30/2017 CLINICAL DATA:  Fall.  Weakness. EXAM: CT HEAD WITHOUT CONTRAST CT CERVICAL SPINE WITHOUT CONTRAST TECHNIQUE: Multidetector CT imaging of the head and cervical spine was performed following the standard protocol without intravenous contrast. Multiplanar CT image reconstructions of the cervical spine were also generated. COMPARISON:  10/03/2017 FINDINGS: CT HEAD FINDINGS Brain: No evidence of acute infarction, hemorrhage, hydrocephalus, or extra-axial collection. There is mild diffuse low-attenuation within the subcortical and periventricular white matter compatible with chronic microvascular disease. Again noted is a mass within the sella turcica and suprasellar cistern, image 33/6. Vascular: No hyperdense vessel or unexpected calcification. Skull: Normal. Negative for fracture or focal lesion. Sinuses/Orbits: Fluid levels identified within bilateral maxillary sinuses. Other: None CT CERVICAL SPINE FINDINGS Alignment: Straightening of normal cervical lordosis. Anterolisthesis of C4 on C5 identified. Skull base and vertebrae: No fractures identified. Soft tissues and spinal canal: No prevertebral fluid or swelling. No visible canal hematoma. Disc levels: Multi level disc space narrowing and ventral endplate spurring noted extending from C3-4 through C6-7. Upper chest: There is a large solid-appearing mass  arising from the left lobe of thyroid gland and isthmus measuring 4.1 cm, image 64/8. Other: None IMPRESSION: 1. No acute intracranial abnormalities. Chronic small vessel ischemic disease noted. 2. No evidence for cervical spine fracture or dislocation. Advanced cervical spondylosis noted. 3. 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 4. **An incidental finding of potential clinical significance has been found. 4.1 cm solid-appearing nodule within the thyroid gland is noted.** Consider further evaluation with thyroid ultrasound. If patient is clinically hyperthyroid, consider nuclear medicine thyroid uptake and scan. 5. Bilateral maxillary sinus fluid levels. Electronically Signed   By: Kerby Moors M.D.   On: 10/30/2017 16:40  Ct Cervical Spine Wo Contrast  Result Date: 10/30/2017 CLINICAL DATA:  Fall.  Weakness. EXAM: CT HEAD WITHOUT CONTRAST CT CERVICAL SPINE WITHOUT CONTRAST TECHNIQUE: Multidetector CT imaging of the head and cervical spine was performed following the standard protocol without intravenous contrast. Multiplanar CT image reconstructions of the cervical spine were also generated. COMPARISON:  10/03/2017 FINDINGS: CT HEAD FINDINGS Brain: No evidence of acute infarction, hemorrhage, hydrocephalus, or extra-axial collection. There is mild diffuse low-attenuation within the subcortical and periventricular white matter compatible with chronic microvascular disease. Again noted is a mass within the sella turcica and suprasellar cistern, image 33/6. Vascular: No hyperdense vessel or unexpected calcification. Skull: Normal. Negative for fracture or focal lesion. Sinuses/Orbits: Fluid levels identified within bilateral maxillary sinuses. Other: None CT CERVICAL SPINE FINDINGS Alignment: Straightening of normal cervical lordosis. Anterolisthesis of C4 on C5 identified. Skull base and vertebrae: No fractures identified. Soft tissues and spinal canal: No prevertebral fluid or  swelling. No visible canal hematoma. Disc levels: Multi level disc space narrowing and ventral endplate spurring noted extending from C3-4 through C6-7. Upper chest: There is a large solid-appearing mass arising from the left lobe of thyroid gland and isthmus measuring 4.1 cm, image 64/8. Other: None IMPRESSION: 1. No acute intracranial abnormalities. Chronic small vessel ischemic disease noted. 2. No evidence for cervical spine fracture or dislocation. Advanced cervical spondylosis noted. 3. 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 4. **An incidental finding of potential clinical significance has been found. 4.1 cm solid-appearing nodule within the thyroid gland is noted.** Consider further evaluation with thyroid ultrasound. If patient is clinically hyperthyroid, consider nuclear medicine thyroid uptake and scan. 5. Bilateral maxillary sinus fluid levels. Electronically Signed   By: Kerby Moors M.D.   On: 10/30/2017 16:40   Mr Mariah [name] NI Contrast  Result Date: 11/01/2017 CLINICAL DATA:  Frequent falls. Weakness. 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 EXAM: MRI HEAD WITHOUT AND WITH CONTRAST TECHNIQUE: Multiplanar, multiecho pulse sequences of the brain and surrounding structures were obtained without and with intravenous contrast. CONTRAST:  103mL MULTIHANCE GADOBENATE DIMEGLUMINE 529 MG/ML IV SOLN COMPARISON:  MRI of the head October 03, 2017 and CT HEAD July 30, 2014 and August 05, 2013 FINDINGS: SELLA: Expansile sellar/suprasellar 1.8 by 2.8 by 3.1 cm mass is relatively unchanged. Mass demonstrates intermediate T2-2, low T1 homogeneous enhancement with reduced diffusion. No susceptibility artifact. Persistent LEFT greater than RIGHT cavernous sinus invasion. Tenting of the optic chiasm and A1-2 junction. No dural tail. INTRACRANIAL CONTENTS: No reduced diffusion to suggest acute ischemia. The ventricles and sulci are normal for patient's age. No intraparenchymal mass  or mass effect. Patchy to confluent supratentorial white matter FLAIR T2 hyperintensities. No abnormal parenchymal enhancement. No parenchymal brain volume loss for age. No abnormal extra-axial fluid collections. No abnormal intraparenchymal enhancement. No abnormal extra-axial fluid collections. VASCULAR: Normal major intracranial vascular flow voids present at skull base. ORBITS: The ocular globes and orbital contents are non-suspicious. SINUSES: Mild paranasal sinus mucosal thickening with small maxillary sinus air-fluid levels. The SKULL/SOFT TISSUES: No suspicious calvarial bone marrow signal. Craniocervical junction maintained. IMPRESSION: 1. No acute intracranial process. 2. 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 No apoplexy. 3. 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 Electronically Signed   By: Elon Alas M.D.   On: 11/01/2017 21:17    Assessment/Plan  1. Physical deconditioning  - For Home health PT and OT, for therapeutic strengthening exercises, fall precautions  2. 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 Landry Corporal, endocrinologist, to R/O multiple endocrine neoplasia syndrome, follow-up with neurosurgey   3. Hypertension associated with diabetes ([place]) - continue Lasix 40 mg 1 tab daily - amLODipine (NORVASC) 10 MG tablet; Take 1 tablet (10 mg total) by mouth daily.  Dispense: 30 tablet; Refill: 0 - losartan (COZAAR) 100 MG tablet; Take 1 tablet (100 mg total) by mouth daily.  Dispense: 30 tablet; Refill: 0  4. Gastroesophageal reflux disease without esophagitis - esomeprazole (NEXIUM) 20 MG capsule; Take 1 capsule (20 mg total) by mouth every morning. For acid reflex  Dispense: 30 capsule; Refill: 0  5. Chronic pain of both knees - continue Naprosyn 500 mg 1 tab BID, Biofreeze gel PRN   6. Type 2 diabetes mellitus treated without insulin ([facility]) -  continue Janumet 50-1,000 mg 1 tab BID, CBG BID Lab Results  Component Value Date   HGBA1C 8.2 09/01/2017     7. Daytime sleepiness - advised to have sleep apnea evaluation prior to the planned pituitary mass resection   8. Uncomplicated asthma, unspecified asthma severity, unspecified whether persistent - no SOB nor wheezing - Fluticasone-Salmeterol (ADVAIR DISKUS) 100-50 MCG/DOSE AEPB; Inhale 1 puff into the lungs 2 (two) times daily.  Dispense: 60 each; Refill: 0 - mometasone (NASONEX) 50 MCG/ACT nasal spray; Place 1 spray into the nose as needed.  Dispense: 17 g; Refill: 0    I have filled out patient's discharge paperwork and written prescriptions.  Patient will receive home health PT and OT.  DME provided:  Rolling walker and 3-in-1 commode  Total discharge time: Greater than 30 minutes Greater than 50% was spent in counseling and coordination of care.   Discharge time involved coordination of the discharge process with social worker, nursing staff and therapy department. Medical justification for home health services/DME verified.   Durenda Age, NP [facility] [phone] (Monday-Friday 8:00 a.m. - 5:00 p.m.) [phone] (after hours)

## 2017-11-17 ENCOUNTER — Telehealth: Payer: Self-pay | Admitting: Internal Medicine

## 2017-11-17 MED ORDER — FUROSEMIDE 40 MG PO TABS: 40.0000 mg | ORAL_TABLET | Freq: Every day | ORAL | 0 refills | Status: DC

## 2017-11-17 MED ORDER — ALBUTEROL SULFATE HFA 108 (90 BASE) MCG/ACT IN AERS: 2.0000 | INHALATION_SPRAY | Freq: Four times a day (QID) | RESPIRATORY_TRACT | 2 refills | Status: DC | PRN

## 2017-11-17 NOTE — Telephone Encounter (Signed)
Rec'd phone call from [facility] Nurse notifying our office of a delay in PT orders upon D/C.  Patient will be seen on Monday 11/20/2017.

## 2017-11-20 ENCOUNTER — Telehealth: Payer: Self-pay | Admitting: *Deleted

## 2017-11-20 DIAGNOSIS — E669 Obesity, unspecified: Secondary | ICD-10-CM | POA: Diagnosis not present

## 2017-11-20 DIAGNOSIS — J45909 Unspecified asthma, uncomplicated: Secondary | ICD-10-CM | POA: Diagnosis not present

## 2017-11-20 DIAGNOSIS — I152 Hypertension secondary to endocrine disorders: Secondary | ICD-10-CM | POA: Diagnosis not present

## 2017-11-20 DIAGNOSIS — K760 Fatty (change of) liver, not elsewhere classified: Secondary | ICD-10-CM | POA: Diagnosis not present

## 2017-11-20 DIAGNOSIS — M5440 Lumbago with sciatica, unspecified side: Secondary | ICD-10-CM | POA: Diagnosis not present

## 2017-11-20 DIAGNOSIS — Z7984 Long term (current) use of oral hypoglycemic drugs: Secondary | ICD-10-CM | POA: Diagnosis not present

## 2017-11-20 DIAGNOSIS — Z9181 History of falling: Secondary | ICD-10-CM | POA: Diagnosis not present

## 2017-11-20 DIAGNOSIS — Z6841 Body Mass Index (BMI) 40.0 and over, adult: Secondary | ICD-10-CM | POA: Diagnosis not present

## 2017-11-20 DIAGNOSIS — E1159 Type 2 diabetes mellitus with other circulatory complications: Secondary | ICD-10-CM | POA: Diagnosis not present

## 2017-11-20 DIAGNOSIS — M170 Bilateral primary osteoarthritis of knee: Secondary | ICD-10-CM | POA: Diagnosis not present

## 2017-11-20 DIAGNOSIS — Z7951 Long term (current) use of inhaled steroids: Secondary | ICD-10-CM | POA: Diagnosis not present

## 2017-11-20 NOTE — Telephone Encounter (Signed)
Dr Silverio Decamp,  This pt is scheduled for a PV 5-15 for a direct colon with you 5-29 WED.  She has no previous GI history, no previous colon.  She has DM, HTN, asthma and in March 2019 was dx'd with a pituitary tumor.  She is scheduled to have the tumor removed 12-22-17.  She had a fall in early April and was hospitalized.  She had a blood sugar of 28 when this happened .  I just want to make sure she is ok for her [place] colon and that you dont think she needs an OV prior.  Please advise, Thanks so much, Mariah [name] PV

## 2017-11-21 NOTE — Telephone Encounter (Signed)
Please reschedule the screening colonoscopy after she has the surgery for pituitary adenoma and once she recovers. If she has any specific GI issue, please schedule office visit to discuss. Thanks

## 2017-11-22 NOTE — Telephone Encounter (Signed)
Called Mariah [name]- informed her we need to cancel colon and PV until after her 5-31 surgery- Mariah [name] in total agreement- she said this would be too much for her- she will call and RS once she fully recovers from the 5-31 Pit. tumor surgery- Lelan Pons PV

## 2017-11-24 ENCOUNTER — Telehealth: Payer: Self-pay

## 2017-11-24 NOTE — Telephone Encounter (Signed)
Mariah [name] calling back

## 2017-11-24 NOTE — Telephone Encounter (Signed)
Okay, that is fine with me.

## 2017-11-24 NOTE — Telephone Encounter (Signed)
Mariah [name] with [name] want to informed the doctor, pt did not want OT evaluation today, and move it to next week.

## 2017-11-27 NOTE — Telephone Encounter (Signed)
Informed Sharyn Lull it's ok to start OT eval this week per Dr Reesa Chew.

## 2017-11-30 ENCOUNTER — Other Ambulatory Visit (INDEPENDENT_AMBULATORY_CARE_PROVIDER_SITE_OTHER): Payer: Medicare Other

## 2017-11-30 ENCOUNTER — Ambulatory Visit: Payer: Medicare Other

## 2017-11-30 DIAGNOSIS — M5440 Lumbago with sciatica, unspecified side: Secondary | ICD-10-CM | POA: Diagnosis not present

## 2017-11-30 DIAGNOSIS — D352 Benign neoplasm of pituitary gland: Secondary | ICD-10-CM | POA: Diagnosis not present

## 2017-11-30 DIAGNOSIS — I152 Hypertension secondary to endocrine disorders: Secondary | ICD-10-CM | POA: Diagnosis not present

## 2017-11-30 DIAGNOSIS — M170 Bilateral primary osteoarthritis of knee: Secondary | ICD-10-CM | POA: Diagnosis not present

## 2017-11-30 DIAGNOSIS — J45909 Unspecified asthma, uncomplicated: Secondary | ICD-10-CM | POA: Diagnosis not present

## 2017-11-30 DIAGNOSIS — E1159 Type 2 diabetes mellitus with other circulatory complications: Secondary | ICD-10-CM | POA: Diagnosis not present

## 2017-11-30 DIAGNOSIS — K760 Fatty (change of) liver, not elsewhere classified: Secondary | ICD-10-CM | POA: Diagnosis not present

## 2017-11-30 LAB — VITAMIN D 25 HYDROXY (VIT D DEFICIENCY, FRACTURES): VITD: 13.65 ng/mL — AB (ref 30.00–100.00)

## 2017-11-30 LAB — CORTISOL: CORTISOL PLASMA: 9.8 ug/dL

## 2017-12-01 ENCOUNTER — Telehealth: Payer: Self-pay | Admitting: Internal Medicine

## 2017-12-01 LAB — PTH, INTACT AND CALCIUM
Calcium: 9.1 mg/dL (ref 8.7–10.3)
PTH: 20 pg/mL (ref 15–65)

## 2017-12-01 NOTE — Telephone Encounter (Signed)
PLEASE CALL MICHELLE (OUP P/T) AT [facility] ABOUT ORDERS FOR THIS PATIENT [phone]

## 2017-12-01 NOTE — Telephone Encounter (Signed)
Lm for rtc 

## 2017-12-04 LAB — ACTH: C206 ACTH: 42 pg/mL (ref 6–50)

## 2017-12-04 LAB — PROLACTIN W/DILUTION: PROLACTIN,UNDILUTED: 22 ng/mL (ref 2.0–30.0)

## 2017-12-05 DIAGNOSIS — J45909 Unspecified asthma, uncomplicated: Secondary | ICD-10-CM | POA: Diagnosis not present

## 2017-12-05 DIAGNOSIS — M170 Bilateral primary osteoarthritis of knee: Secondary | ICD-10-CM | POA: Diagnosis not present

## 2017-12-05 DIAGNOSIS — E1159 Type 2 diabetes mellitus with other circulatory complications: Secondary | ICD-10-CM | POA: Diagnosis not present

## 2017-12-05 DIAGNOSIS — K760 Fatty (change of) liver, not elsewhere classified: Secondary | ICD-10-CM | POA: Diagnosis not present

## 2017-12-05 DIAGNOSIS — M5440 Lumbago with sciatica, unspecified side: Secondary | ICD-10-CM | POA: Diagnosis not present

## 2017-12-05 DIAGNOSIS — I152 Hypertension secondary to endocrine disorders: Secondary | ICD-10-CM | POA: Diagnosis not present

## 2017-12-07 DIAGNOSIS — K760 Fatty (change of) liver, not elsewhere classified: Secondary | ICD-10-CM | POA: Diagnosis not present

## 2017-12-07 DIAGNOSIS — I152 Hypertension secondary to endocrine disorders: Secondary | ICD-10-CM | POA: Diagnosis not present

## 2017-12-07 DIAGNOSIS — J45909 Unspecified asthma, uncomplicated: Secondary | ICD-10-CM | POA: Diagnosis not present

## 2017-12-07 DIAGNOSIS — E1159 Type 2 diabetes mellitus with other circulatory complications: Secondary | ICD-10-CM | POA: Diagnosis not present

## 2017-12-07 DIAGNOSIS — M5440 Lumbago with sciatica, unspecified side: Secondary | ICD-10-CM | POA: Diagnosis not present

## 2017-12-07 DIAGNOSIS — M170 Bilateral primary osteoarthritis of knee: Secondary | ICD-10-CM | POA: Diagnosis not present

## 2017-12-08 ENCOUNTER — Institutional Professional Consult (permissible substitution): Payer: Medicare Other | Admitting: Pulmonary Disease

## 2017-12-09 ENCOUNTER — Other Ambulatory Visit: Payer: Self-pay | Admitting: Student in an Organized Health Care Education/Training Program

## 2017-12-09 DIAGNOSIS — I10 Essential (primary) hypertension: Principal | ICD-10-CM

## 2017-12-09 DIAGNOSIS — E1159 Type 2 diabetes mellitus with other circulatory complications: Secondary | ICD-10-CM

## 2017-12-11 ENCOUNTER — Other Ambulatory Visit: Payer: Self-pay | Admitting: Internal Medicine

## 2017-12-12 ENCOUNTER — Other Ambulatory Visit: Payer: Self-pay | Admitting: *Deleted

## 2017-12-12 DIAGNOSIS — I152 Hypertension secondary to endocrine disorders: Secondary | ICD-10-CM | POA: Diagnosis not present

## 2017-12-12 DIAGNOSIS — K760 Fatty (change of) liver, not elsewhere classified: Secondary | ICD-10-CM | POA: Diagnosis not present

## 2017-12-12 DIAGNOSIS — J45909 Unspecified asthma, uncomplicated: Secondary | ICD-10-CM

## 2017-12-12 DIAGNOSIS — E1159 Type 2 diabetes mellitus with other circulatory complications: Secondary | ICD-10-CM | POA: Diagnosis not present

## 2017-12-12 DIAGNOSIS — M5440 Lumbago with sciatica, unspecified side: Secondary | ICD-10-CM | POA: Diagnosis not present

## 2017-12-12 DIAGNOSIS — M170 Bilateral primary osteoarthritis of knee: Secondary | ICD-10-CM | POA: Diagnosis not present

## 2017-12-12 MED ORDER — FLUTICASONE-SALMETEROL 100-50 MCG/DOSE IN AEPB: 1.0000 | INHALATION_SPRAY | Freq: Two times a day (BID) | RESPIRATORY_TRACT | 2 refills | Status: DC

## 2017-12-12 MED ORDER — MOMETASONE FUROATE 50 MCG/ACT NA SUSP: 1.0000 | NASAL | 0 refills | Status: DC | PRN

## 2017-12-13 DIAGNOSIS — J45909 Unspecified asthma, uncomplicated: Secondary | ICD-10-CM | POA: Diagnosis not present

## 2017-12-13 DIAGNOSIS — K760 Fatty (change of) liver, not elsewhere classified: Secondary | ICD-10-CM | POA: Diagnosis not present

## 2017-12-13 DIAGNOSIS — M170 Bilateral primary osteoarthritis of knee: Secondary | ICD-10-CM | POA: Diagnosis not present

## 2017-12-13 DIAGNOSIS — I152 Hypertension secondary to endocrine disorders: Secondary | ICD-10-CM | POA: Diagnosis not present

## 2017-12-13 DIAGNOSIS — E1159 Type 2 diabetes mellitus with other circulatory complications: Secondary | ICD-10-CM | POA: Diagnosis not present

## 2017-12-13 DIAGNOSIS — M5440 Lumbago with sciatica, unspecified side: Secondary | ICD-10-CM | POA: Diagnosis not present

## 2017-12-14 DIAGNOSIS — M5440 Lumbago with sciatica, unspecified side: Secondary | ICD-10-CM | POA: Diagnosis not present

## 2017-12-14 DIAGNOSIS — M170 Bilateral primary osteoarthritis of knee: Secondary | ICD-10-CM | POA: Diagnosis not present

## 2017-12-14 DIAGNOSIS — E1159 Type 2 diabetes mellitus with other circulatory complications: Secondary | ICD-10-CM | POA: Diagnosis not present

## 2017-12-14 DIAGNOSIS — K760 Fatty (change of) liver, not elsewhere classified: Secondary | ICD-10-CM | POA: Diagnosis not present

## 2017-12-14 DIAGNOSIS — J45909 Unspecified asthma, uncomplicated: Secondary | ICD-10-CM | POA: Diagnosis not present

## 2017-12-14 DIAGNOSIS — I152 Hypertension secondary to endocrine disorders: Secondary | ICD-10-CM | POA: Diagnosis not present

## 2017-12-15 ENCOUNTER — Encounter: Payer: Medicare Other | Admitting: Obstetrics and Gynecology

## 2017-12-15 ENCOUNTER — Encounter: Payer: Self-pay | Admitting: Family Medicine

## 2017-12-15 DIAGNOSIS — E1159 Type 2 diabetes mellitus with other circulatory complications: Secondary | ICD-10-CM | POA: Diagnosis not present

## 2017-12-15 DIAGNOSIS — I152 Hypertension secondary to endocrine disorders: Secondary | ICD-10-CM | POA: Diagnosis not present

## 2017-12-15 DIAGNOSIS — K760 Fatty (change of) liver, not elsewhere classified: Secondary | ICD-10-CM | POA: Diagnosis not present

## 2017-12-15 DIAGNOSIS — J45909 Unspecified asthma, uncomplicated: Secondary | ICD-10-CM | POA: Diagnosis not present

## 2017-12-15 DIAGNOSIS — M170 Bilateral primary osteoarthritis of knee: Secondary | ICD-10-CM | POA: Diagnosis not present

## 2017-12-15 DIAGNOSIS — M5440 Lumbago with sciatica, unspecified side: Secondary | ICD-10-CM | POA: Diagnosis not present

## 2017-12-15 NOTE — Addendum Note (Signed)
Addended by: Hulan Fray on: 12/15/2017 04:44 PM   Modules accepted: Orders

## 2017-12-17 NOTE — Pre-Procedure Instructions (Signed)
Mariah [name]  12/17/2017      [facility], [place] [address] [place] [address] Phone: [phone] Fax: [phone]    Your procedure is scheduled on Dec 22, 2017.  Report to [facility] Admitting at 05:30 A.M.  Call this number if you have problems the morning of surgery:  [phone]   Remember:  No food or liquids after midnight.  Continue all other medications as directed by your physician except for following these medication instructions before surgery.    Take these medicines the morning of surgery with A SIP OF WATER : Amlodipine (Norvasc) Esomeprazole (Nexium) Albuterol Inhaler- bring with you Advair Diskus- bring with you Nasal Spray if needed  7 days prior to surgery STOP taking any Aspirin (unless otherwise instructed by your surgeon), Aleve, Naproxen, Ibuprofen, Motrin, Advil, Goody's, BC's, all herbal medications, fish oil, and all vitamins.     How to Manage Your Diabetes Before and After Surgery  Why is it important to control my blood sugar before and after surgery? . Improving blood sugar levels before and after surgery helps healing and can limit problems. . A way of improving blood sugar control is eating a healthy diet by: o  Eating less sugar and carbohydrates o  Increasing activity/exercise o  Talking with your doctor about reaching your blood sugar goals . High blood sugars (greater than 180 mg/dL) can raise your risk of infections and slow your recovery, so you will need to focus on controlling your diabetes during the weeks before surgery. . Make sure that the doctor who takes care of your diabetes knows about your planned surgery including the date and location.  How do I manage my blood sugar before surgery? . Check your blood sugar at least 4 times a day, starting 2 days before surgery, to make sure that the level is not too high or low. o Check your blood  sugar the morning of your surgery when you wake up and every 2 hours until you get to the Short Stay unit. . If your blood sugar is less than 70 mg/dL, you will need to treat for low blood sugar: o Do not take insulin. o Treat a low blood sugar (less than 70 mg/dL) with  cup of clear juice (cranberry or apple), 4 glucose tablets, OR glucose gel. Recheck blood sugar in 15 minutes after treatment (to make sure it is greater than 70 mg/dL). If your blood sugar is not greater than 70 mg/dL on recheck, call [phone] o  for further instructions. . Report your blood sugar to the short stay nurse when you get to Short Stay.  . If you are admitted to the hospital after surgery: o Your blood sugar will be checked by the staff and you will probably be given insulin after surgery (instead of oral diabetes medicines) to make sure you have good blood sugar levels. o The goal for blood sugar control after surgery is 80-180 mg/dL.              WHAT DO I DO ABOUT MY DIABETES MEDICATION?   [name] Do not take oral diabetes medicines (pills) the morning of surgery.  DO NOT TAKE GLIPIZIDE (GLUCOTROL XL) or JANUMET THE MORNING OF SURGERY.     Do not wear jewelry, make-up or nail polish.  Do not wear lotions, powders, or perfumes, or deodorant.  Do not shave 48 hours prior to surgery.  Do not bring valuables to the hospital.  [facility] is not responsible for any belongings or valuables.  Contacts, dentures or bridgework may not be worn into surgery.  Leave your suitcase in the car.  After surgery it may be brought to your room.  For patients admitted to the hospital, discharge time will be determined by your treatment team.  Patients discharged the day of surgery will not be allowed to drive home.   Special instructions:   [place]- Preparing For Surgery  Before surgery, you can play an important role. Because skin is not sterile, your skin needs to be as free of germs as possible. You  can reduce the number of germs on your skin by washing with CHG (chlorahexidine gluconate) Soap before surgery.  CHG is an antiseptic cleaner which kills germs and bonds with the skin to continue killing germs even after washing.    Oral Hygiene is also important to reduce your risk of infection.  Remember - BRUSH YOUR TEETH THE MORNING OF SURGERY WITH YOUR REGULAR TOOTHPASTE  Please do not use if you have an allergy to CHG or antibacterial soaps. If your skin becomes reddened/irritated stop using the CHG.  Do not shave (including legs and underarms) for at least 48 hours prior to first CHG shower. It is OK to shave your face.  Please follow these instructions carefully.   1. Shower the NIGHT BEFORE SURGERY and the MORNING OF SURGERY with CHG.   2. If you chose to wash your hair, wash your hair first as usual with your normal shampoo.  3. After you shampoo, rinse your hair and body thoroughly to remove the shampoo.  4. Use CHG as you would any other liquid soap. You can apply CHG directly to the skin and wash gently with a scrungie or a clean washcloth.   5. Apply the CHG Soap to your body ONLY FROM THE NECK DOWN.  Do not use on open wounds or open sores. Avoid contact with your eyes, ears, mouth and genitals (private parts). Wash Face and genitals (private parts)  with your normal soap.  6. Wash thoroughly, paying special attention to the area where your surgery will be performed.  7. Thoroughly rinse your body with warm water from the neck down.  8. DO NOT shower/wash with your normal soap after using and rinsing off the CHG Soap.  9. Pat yourself dry with a CLEAN TOWEL.  10. Wear CLEAN PAJAMAS to bed the night before surgery, wear comfortable clothes the morning of surgery  11. Place CLEAN SHEETS on your bed the night of your first shower and DO NOT SLEEP WITH PETS.    Day of Surgery:  Do not apply any deodorants/lotions.  Please wear clean clothes to the hospital/surgery  center.   Remember to brush your teeth WITH YOUR REGULAR TOOTHPASTE.    Please read over the following fact sheets that you were given.

## 2017-12-19 ENCOUNTER — Other Ambulatory Visit: Payer: Self-pay

## 2017-12-19 ENCOUNTER — Encounter (HOSPITAL_COMMUNITY): Payer: Self-pay

## 2017-12-19 ENCOUNTER — Encounter (HOSPITAL_COMMUNITY)
Admission: RE | Admit: 2017-12-19 | Discharge: 2017-12-19 | Disposition: A | Discharge status: Home / Self Care | Payer: Medicare Other | Source: Ambulatory Visit | Attending: Neurosurgery | Admitting: Neurosurgery

## 2017-12-19 DIAGNOSIS — E039 Hypothyroidism, unspecified: Secondary | ICD-10-CM | POA: Insufficient documentation

## 2017-12-19 DIAGNOSIS — E119 Type 2 diabetes mellitus without complications: Secondary | ICD-10-CM | POA: Insufficient documentation

## 2017-12-19 DIAGNOSIS — Z79899 Other long term (current) drug therapy: Secondary | ICD-10-CM | POA: Diagnosis not present

## 2017-12-19 DIAGNOSIS — I10 Essential (primary) hypertension: Secondary | ICD-10-CM | POA: Insufficient documentation

## 2017-12-19 DIAGNOSIS — K219 Gastro-esophageal reflux disease without esophagitis: Secondary | ICD-10-CM | POA: Insufficient documentation

## 2017-12-19 DIAGNOSIS — Z01812 Encounter for preprocedural laboratory examination: Secondary | ICD-10-CM | POA: Insufficient documentation

## 2017-12-19 HISTORY — DX: Peripheral vascular disease, unspecified: I73.9

## 2017-12-19 LAB — TYPE AND SCREEN
ABO/RH(D): O POS
Antibody Screen: NEGATIVE

## 2017-12-19 LAB — BASIC METABOLIC PANEL
ANION GAP: 10 (ref 5–15)
BUN: 16 mg/dL (ref 6–20)
CHLORIDE: 105 mmol/L (ref 101–111)
CO2: 24 mmol/L (ref 22–32)
Calcium: 8.9 mg/dL (ref 8.9–10.3)
Creatinine, Ser: 1 mg/dL (ref 0.44–1.00)
GFR calc non Af Amer: 58 mL/min — ABNORMAL LOW (ref >60)
Glucose, Bld: 73 mg/dL (ref 65–99)
Potassium: 3.7 mmol/L (ref 3.5–5.1)
Sodium: 139 mmol/L (ref 135–145)

## 2017-12-19 LAB — CBC
HEMATOCRIT: 38.1 % (ref 36.0–46.0)
HEMOGLOBIN: 11.7 g/dL — AB (ref 12.0–15.0)
MCH: 28.4 pg (ref 26.0–34.0)
MCHC: 30.7 g/dL (ref 30.0–36.0)
MCV: 92.5 fL (ref 78.0–100.0)
Platelets: 397 10*3/uL (ref 150–400)
RBC: 4.12 MIL/uL (ref 3.87–5.11)
RDW: 15.4 % (ref 11.5–15.5)
WBC: 14.7 10*3/uL — ABNORMAL HIGH (ref 4.0–10.5)

## 2017-12-19 LAB — GLUCOSE, CAPILLARY
GLUCOSE-CAPILLARY: 86 mg/dL (ref 65–99)
Glucose-Capillary: 63 mg/dL — ABNORMAL LOW (ref 65–99)

## 2017-12-19 LAB — ABO/RH: ABO/RH(D): O POS

## 2017-12-19 LAB — HEMOGLOBIN A1C
Hgb A1c MFr Bld: 6.6 % — ABNORMAL HIGH (ref 4.8–5.6)
MEAN PLASMA GLUCOSE: 142.72 mg/dL

## 2017-12-19 NOTE — Progress Notes (Signed)
Anesthesia Chart Review:   Case:  [id number] Date/Time:  12/22/17 0715   Procedures:      ENDOSCOPIC TRANSPHENOIDAL RESECTION OF PITUITARY TUMOR (N/A ) - ENDOSCOPIC TRANSPHENOIDAL RESECTION OF PITUITARY TUMOR     TRANSPHENOIDAL  RESECTION OF PITUITARY TUMOR (N/A )     SINUS ENDO WITH FUSION (N/A )   Anesthesia type:  General   Pre-op diagnosis:  PITUITARY MACROADEMONA WITH EXTRASELLAR EXTENSION   Location:  [facility] OR ROOM 21 / [facility]   Surgeon:  Consuella Lose, MD; Jerrell Belfast, MD      DISCUSSION: - Pt is a 67 year old female with hx HTN, DM, asthma, hypothyroidism  - Hospitalized 4/8-4/11/19 for acute encephalopathy after fall. Patient had no notable metabolic or hematologic derangements on  Admission of significance. Her symptoms resolved spontaneously the following morning. Pt went to nursing home after discharge until 11/16/17  - Pt reports ~1.5 year history of spontaneous falling asleep and excessive daytime sleepiness.  Was referred for sleep study in January 2019 but has not had it done yet.  Physicians at [facility] document pt needs sleep study prior to surgery.  (Note pt fell asleep repeatedly during pre-admission testing appointment mid-conversation)  - Reviewed case with Dr. Therisa Doyne.  Unless surgery is urgent, it is preferable for pt to get sleep study prior to surgery.  However, per [facility] in Dr. Cleotilde Neer office, Dr. Kathyrn Sheriff feels surgery is urgent it is inadvisable to delay surgery.  Surgery will proceed as scheduled.     VS: BP (!) 163/84   Pulse 97   Temp 37 C   Resp 20   Ht 5\' 1"  (1.549 m)   Wt 232 lb 3.2 oz (105.3 kg)   LMP 04/08/2013 Comment: spotting & clotting; Gyn F/U indicated  SpO2 98%   BMI 43.87 kg/m    PROVIDERS: PCP is [name], MD  Endocrinologist is Philemon Kingdom, MD   LABS: Labs reviewed: Acceptable for surgery. (all labs ordered are listed, but only abnormal results are displayed)  Labs Reviewed  GLUCOSE,  CAPILLARY - Abnormal; Notable for the following components:      Result Value   Glucose-Capillary 63 (*)    All other components within normal limits  CBC - Abnormal; Notable for the following components:   WBC 14.7 (*)    Hemoglobin 11.7 (*)    All other components within normal limits  BASIC METABOLIC PANEL - Abnormal; Notable for the following components:   GFR calc non Af Amer 58 (*)    All other components within normal limits  HEMOGLOBIN A1C - Abnormal; Notable for the following components:   Hgb A1c MFr Bld 6.6 (*)    All other components within normal limits  GLUCOSE, CAPILLARY  TYPE AND SCREEN  ABO/RH     IMAGES:  MRI brain 11/01/17:  1. No acute intracranial process. 2. 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 No apoplexy. 3. 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  CXR 10/30/17: No acute cardiopulmonary abnormality.  US thyroid 09/26/17:  - Lower isthmic thyroid (labeled 1) nodule meets criteria for biopsy (Note FNA bx thyroid ordered but has not yet happened)  EKG 11/07/17: Sinus rhythm. Nonspecific T abnormalities   CV:  Echo 11/07/13:  - Left ventricle: The cavity size was normal. Wall thicknesswas increased in a pattern of mild LVH. Systolic functionwas normal. The estimated ejection fraction was in therange of 60% to 65%. Wall motion was normal;  there were noregional wall motion abnormalities. Features are consistent with a pseudonormal left ventricular fillingpattern, with concomitant abnormal relaxation andincreased filling pressure (grade 2 diastolicdysfunction). - Aortic valve: There was no stenosis. - Mitral valve: Mildly calcified annulus. Normal thicknessleaflets. No significant regurgitation. - Right ventricle: The cavity size was normal. Systolicfunction was normal. - Tricuspid valve: Peak RV-RA gradient: 41mm Hg (S). - Pulmonary arteries: PA peak pressure: 58mm Hg  (S). - Inferior vena cava: The vessel was normal in size; therespirophasic diameter changes were in the normal range (= 50%); findings are consistent with normal central venouspressure.    Past Medical History:  Diagnosis Date  . Allergic rhinitis   . Arthritis    "ankles, feet, knees" (10/30/2017)  . Asthma   . Bursitis   . Carpal tunnel syndrome   . GERD (gastroesophageal reflux disease)   . HTN (hypertension)   . Migraine    "a couple/yr" (10/30/2017)  . Obesity   . Peripheral vascular disease ([place])   . Pneumonia 1960s X 1  . Sciatica   . Tendinitis   . Type II diabetes mellitus ([place])     Past Surgical History:  Procedure Laterality Date  . GANGLION CYST EXCISION Left   . KNEE ARTHROSCOPY Left 2003  . PLANTAR FASCIA RELEASE Right 1995   "heel"  . TONSILLECTOMY  1958  . [place]; 1997   Dr [name]    MEDICATIONS: . albuterol (PROVENTIL HFA;VENTOLIN HFA) 108 (90 Base) MCG/ACT inhaler  . amLODipine (NORVASC) 10 MG tablet  . AQUALANCE LANCETS 30G MISC  . esomeprazole (NEXIUM) 20 MG capsule  . Fluticasone-Salmeterol (ADVAIR DISKUS) 100-50 MCG/DOSE AEPB  . furosemide (LASIX) 40 MG tablet  . glipiZIDE (GLUCOTROL XL) 10 MG 24 hr tablet  . JANUMET 50-1000 MG tablet  . lisinopril (PRINIVIL,ZESTRIL) 40 MG tablet  . losartan (COZAAR) 100 MG tablet  . mometasone (NASONEX) 50 MCG/ACT nasal spray  . naproxen (NAPROSYN) 500 MG tablet  . TRUE METRIX BLOOD GLUCOSE TEST test strip   No current facility-administered medications for this encounter.     If no changes, I anticipate pt can proceed with surgery as scheduled.   Willeen Cass, FNP-BC [facility] Short Stay Surgical Center/Anesthesiology Phone: [phone] 12/20/2017 2:14 PM

## 2017-12-19 NOTE — Progress Notes (Signed)
Notified Willeen Cass of patient having history of being very sleepy, unable to stay awake during PAT interview.  Glucose was 63, patient given gingerale.  According to note from Dr. Linna Darner patient was supposed to be evaluated for sleep apnea prior to surgery.  Levada Dy came over to PAT and gave number to call [place] neuro today to set up sleep study- which is ordered.  This number was given to patient's friend.

## 2017-12-20 ENCOUNTER — Encounter: Payer: Medicaid Other | Admitting: Gastroenterology

## 2017-12-20 DIAGNOSIS — J45909 Unspecified asthma, uncomplicated: Secondary | ICD-10-CM | POA: Diagnosis not present

## 2017-12-20 DIAGNOSIS — J343 Hypertrophy of nasal turbinates: Secondary | ICD-10-CM | POA: Diagnosis not present

## 2017-12-20 DIAGNOSIS — D443 Neoplasm of uncertain behavior of pituitary gland: Secondary | ICD-10-CM | POA: Diagnosis not present

## 2017-12-20 DIAGNOSIS — M5440 Lumbago with sciatica, unspecified side: Secondary | ICD-10-CM | POA: Diagnosis not present

## 2017-12-20 DIAGNOSIS — I152 Hypertension secondary to endocrine disorders: Secondary | ICD-10-CM | POA: Diagnosis not present

## 2017-12-20 DIAGNOSIS — J342 Deviated nasal septum: Secondary | ICD-10-CM | POA: Diagnosis not present

## 2017-12-20 DIAGNOSIS — J0140 Acute pansinusitis, unspecified: Secondary | ICD-10-CM | POA: Diagnosis not present

## 2017-12-20 DIAGNOSIS — K760 Fatty (change of) liver, not elsewhere classified: Secondary | ICD-10-CM | POA: Diagnosis not present

## 2017-12-20 DIAGNOSIS — M170 Bilateral primary osteoarthritis of knee: Secondary | ICD-10-CM | POA: Diagnosis not present

## 2017-12-20 DIAGNOSIS — J0141 Acute recurrent pansinusitis: Secondary | ICD-10-CM | POA: Diagnosis not present

## 2017-12-20 DIAGNOSIS — E1159 Type 2 diabetes mellitus with other circulatory complications: Secondary | ICD-10-CM | POA: Diagnosis not present

## 2017-12-21 ENCOUNTER — Other Ambulatory Visit: Payer: Self-pay | Admitting: Neurosurgery

## 2017-12-21 MED ORDER — VANCOMYCIN HCL 10 G IV SOLR
1500.0000 mg | INTRAVENOUS | Status: AC
  Filled 2017-12-21: qty 1500

## 2017-12-23 DIAGNOSIS — E1159 Type 2 diabetes mellitus with other circulatory complications: Secondary | ICD-10-CM | POA: Diagnosis not present

## 2017-12-23 DIAGNOSIS — M5440 Lumbago with sciatica, unspecified side: Secondary | ICD-10-CM | POA: Diagnosis not present

## 2017-12-23 DIAGNOSIS — M170 Bilateral primary osteoarthritis of knee: Secondary | ICD-10-CM | POA: Diagnosis not present

## 2017-12-23 DIAGNOSIS — I152 Hypertension secondary to endocrine disorders: Secondary | ICD-10-CM | POA: Diagnosis not present

## 2017-12-23 DIAGNOSIS — J45909 Unspecified asthma, uncomplicated: Secondary | ICD-10-CM | POA: Diagnosis not present

## 2017-12-23 DIAGNOSIS — K760 Fatty (change of) liver, not elsewhere classified: Secondary | ICD-10-CM | POA: Diagnosis not present

## 2017-12-26 ENCOUNTER — Other Ambulatory Visit: Payer: Self-pay | Admitting: Student in an Organized Health Care Education/Training Program

## 2017-12-26 DIAGNOSIS — I10 Essential (primary) hypertension: Principal | ICD-10-CM

## 2017-12-26 DIAGNOSIS — M5440 Lumbago with sciatica, unspecified side: Secondary | ICD-10-CM | POA: Diagnosis not present

## 2017-12-26 DIAGNOSIS — E1159 Type 2 diabetes mellitus with other circulatory complications: Secondary | ICD-10-CM

## 2017-12-26 DIAGNOSIS — J45909 Unspecified asthma, uncomplicated: Secondary | ICD-10-CM | POA: Diagnosis not present

## 2017-12-26 DIAGNOSIS — I152 Hypertension secondary to endocrine disorders: Secondary | ICD-10-CM | POA: Diagnosis not present

## 2017-12-26 DIAGNOSIS — M170 Bilateral primary osteoarthritis of knee: Secondary | ICD-10-CM | POA: Diagnosis not present

## 2017-12-26 DIAGNOSIS — K760 Fatty (change of) liver, not elsewhere classified: Secondary | ICD-10-CM | POA: Diagnosis not present

## 2017-12-29 DIAGNOSIS — I152 Hypertension secondary to endocrine disorders: Secondary | ICD-10-CM | POA: Diagnosis not present

## 2017-12-29 DIAGNOSIS — M170 Bilateral primary osteoarthritis of knee: Secondary | ICD-10-CM | POA: Diagnosis not present

## 2017-12-29 DIAGNOSIS — J45909 Unspecified asthma, uncomplicated: Secondary | ICD-10-CM | POA: Diagnosis not present

## 2017-12-29 DIAGNOSIS — K760 Fatty (change of) liver, not elsewhere classified: Secondary | ICD-10-CM | POA: Diagnosis not present

## 2017-12-29 DIAGNOSIS — E1159 Type 2 diabetes mellitus with other circulatory complications: Secondary | ICD-10-CM | POA: Diagnosis not present

## 2017-12-29 DIAGNOSIS — M5440 Lumbago with sciatica, unspecified side: Secondary | ICD-10-CM | POA: Diagnosis not present

## 2018-01-05 ENCOUNTER — Other Ambulatory Visit: Payer: Self-pay | Admitting: Internal Medicine

## 2018-01-05 DIAGNOSIS — E119 Type 2 diabetes mellitus without complications: Secondary | ICD-10-CM

## 2018-01-08 ENCOUNTER — Other Ambulatory Visit: Payer: Self-pay | Admitting: *Deleted

## 2018-01-08 MED ORDER — GLIPIZIDE ER 10 MG PO TB24: 20.0000 mg | ORAL_TABLET | Freq: Every day | ORAL | 1 refills | Status: DC

## 2018-01-10 ENCOUNTER — Other Ambulatory Visit: Payer: Self-pay | Admitting: *Deleted

## 2018-01-10 ENCOUNTER — Telehealth: Payer: Self-pay | Admitting: Internal Medicine

## 2018-01-10 DIAGNOSIS — I152 Hypertension secondary to endocrine disorders: Secondary | ICD-10-CM

## 2018-01-10 DIAGNOSIS — I10 Essential (primary) hypertension: Secondary | ICD-10-CM

## 2018-01-10 DIAGNOSIS — E1159 Type 2 diabetes mellitus with other circulatory complications: Secondary | ICD-10-CM

## 2018-01-10 DIAGNOSIS — E119 Type 2 diabetes mellitus without complications: Secondary | ICD-10-CM

## 2018-01-10 MED ORDER — SITAGLIPTIN PHOS-METFORMIN HCL 50-1000 MG PO TABS: 1.0000 | ORAL_TABLET | Freq: Two times a day (BID) | ORAL | 0 refills | Status: DC

## 2018-01-10 NOTE — Telephone Encounter (Signed)
The renewal letter was sent and it was sent back blank, please fill out and send back to the pharmacy

## 2018-01-10 NOTE — Telephone Encounter (Signed)
Called pharmacy, needed one refill, sent in new encounter

## 2018-01-10 NOTE — Telephone Encounter (Signed)
According to our records patient is on lisinopril on her losartan.

## 2018-01-15 ENCOUNTER — Other Ambulatory Visit: Payer: Self-pay | Admitting: Otolaryngology

## 2018-01-18 ENCOUNTER — Other Ambulatory Visit: Payer: Self-pay

## 2018-01-18 ENCOUNTER — Encounter (HOSPITAL_COMMUNITY): Payer: Self-pay | Admitting: *Deleted

## 2018-01-18 NOTE — Progress Notes (Addendum)
I spoke to Mariah [name] , she denies xchest pain or shortness of breath. Patienttis not currently checking CBG, "my machine is not working right."  I instructed patient to not take any more Naproxen and not take medications for diabetes in am.  I reminded patient to follow the instructions she was given at PAT visit, including showering and changing linens.

## 2018-01-19 ENCOUNTER — Inpatient Hospital Stay (HOSPITAL_COMMUNITY)
Admission: RE | Admit: 2018-01-19 | Discharge: 2018-01-23 | DRG: 614 | Disposition: A | Discharge status: Home / Self Care | Payer: Medicare Other | Source: Ambulatory Visit | Attending: Neurosurgery | Admitting: Neurosurgery

## 2018-01-19 ENCOUNTER — Inpatient Hospital Stay (HOSPITAL_COMMUNITY): Payer: Medicare Other | Admitting: Certified Registered Nurse Anesthetist

## 2018-01-19 ENCOUNTER — Encounter (HOSPITAL_COMMUNITY): Payer: Self-pay | Admitting: *Deleted

## 2018-01-19 ENCOUNTER — Inpatient Hospital Stay (HOSPITAL_COMMUNITY): Payer: Medicare Other | Admitting: Emergency Medicine

## 2018-01-19 ENCOUNTER — Encounter (HOSPITAL_COMMUNITY)
Admission: RE | Disposition: A | Discharge status: Home / Self Care | Payer: Self-pay | Source: Ambulatory Visit | Attending: Neurosurgery

## 2018-01-19 DIAGNOSIS — M19071 Primary osteoarthritis, right ankle and foot: Secondary | ICD-10-CM | POA: Diagnosis present

## 2018-01-19 DIAGNOSIS — Y753 Surgical instruments, materials and neurological devices (including sutures) associated with adverse incidents: Secondary | ICD-10-CM | POA: Diagnosis not present

## 2018-01-19 DIAGNOSIS — E1151 Type 2 diabetes mellitus with diabetic peripheral angiopathy without gangrene: Secondary | ICD-10-CM | POA: Diagnosis present

## 2018-01-19 DIAGNOSIS — Z885 Allergy status to narcotic agent status: Secondary | ICD-10-CM

## 2018-01-19 DIAGNOSIS — E1169 Type 2 diabetes mellitus with other specified complication: Secondary | ICD-10-CM | POA: Diagnosis present

## 2018-01-19 DIAGNOSIS — K219 Gastro-esophageal reflux disease without esophagitis: Secondary | ICD-10-CM | POA: Diagnosis not present

## 2018-01-19 DIAGNOSIS — D352 Benign neoplasm of pituitary gland: Secondary | ICD-10-CM | POA: Diagnosis not present

## 2018-01-19 DIAGNOSIS — D497 Neoplasm of unspecified behavior of endocrine glands and other parts of nervous system: Secondary | ICD-10-CM

## 2018-01-19 DIAGNOSIS — Z8249 Family history of ischemic heart disease and other diseases of the circulatory system: Secondary | ICD-10-CM

## 2018-01-19 DIAGNOSIS — G960 Cerebrospinal fluid leak: Secondary | ICD-10-CM | POA: Diagnosis not present

## 2018-01-19 DIAGNOSIS — Z6841 Body Mass Index (BMI) 40.0 and over, adult: Secondary | ICD-10-CM

## 2018-01-19 DIAGNOSIS — K760 Fatty (change of) liver, not elsewhere classified: Secondary | ICD-10-CM | POA: Diagnosis not present

## 2018-01-19 DIAGNOSIS — Z820 Family history of epilepsy and other diseases of the nervous system: Secondary | ICD-10-CM | POA: Diagnosis not present

## 2018-01-19 DIAGNOSIS — I152 Hypertension secondary to endocrine disorders: Secondary | ICD-10-CM | POA: Diagnosis not present

## 2018-01-19 DIAGNOSIS — Z7984 Long term (current) use of oral hypoglycemic drugs: Secondary | ICD-10-CM

## 2018-01-19 DIAGNOSIS — Z880 Allergy status to penicillin: Secondary | ICD-10-CM | POA: Diagnosis not present

## 2018-01-19 DIAGNOSIS — M19072 Primary osteoarthritis, left ankle and foot: Secondary | ICD-10-CM | POA: Diagnosis present

## 2018-01-19 DIAGNOSIS — J329 Chronic sinusitis, unspecified: Secondary | ICD-10-CM | POA: Diagnosis not present

## 2018-01-19 DIAGNOSIS — G9782 Other postprocedural complications and disorders of nervous system: Secondary | ICD-10-CM | POA: Diagnosis not present

## 2018-01-19 DIAGNOSIS — Z8049 Family history of malignant neoplasm of other genital organs: Secondary | ICD-10-CM

## 2018-01-19 DIAGNOSIS — Y838 Other surgical procedures as the cause of abnormal reaction of the patient, or of later complication, without mention of misadventure at the time of the procedure: Secondary | ICD-10-CM | POA: Diagnosis not present

## 2018-01-19 DIAGNOSIS — G43909 Migraine, unspecified, not intractable, without status migrainosus: Secondary | ICD-10-CM | POA: Diagnosis not present

## 2018-01-19 DIAGNOSIS — Z888 Allergy status to other drugs, medicaments and biological substances status: Secondary | ICD-10-CM | POA: Diagnosis not present

## 2018-01-19 DIAGNOSIS — M170 Bilateral primary osteoarthritis of knee: Secondary | ICD-10-CM | POA: Diagnosis present

## 2018-01-19 DIAGNOSIS — H5347 Heteronymous bilateral field defects: Secondary | ICD-10-CM | POA: Diagnosis present

## 2018-01-19 DIAGNOSIS — I10 Essential (primary) hypertension: Secondary | ICD-10-CM | POA: Diagnosis not present

## 2018-01-19 HISTORY — PX: TRANSPHENOIDAL APPROACH EXPOSURE: SHX6311

## 2018-01-19 HISTORY — PX: CRANIOTOMY: SHX93

## 2018-01-19 HISTORY — PX: SINUS ENDO WITH FUSION: SHX5329

## 2018-01-19 LAB — BASIC METABOLIC PANEL
ANION GAP: 8 (ref 5–15)
BUN: 17 mg/dL (ref 8–23)
CALCIUM: 8.8 mg/dL — AB (ref 8.9–10.3)
CO2: 27 mmol/L (ref 22–32)
Chloride: 103 mmol/L (ref 98–111)
Creatinine, Ser: 1 mg/dL (ref 0.44–1.00)
GFR calc Af Amer: >60 mL/min (ref >60)
GFR, EST NON AFRICAN AMERICAN: 58 mL/min — AB (ref >60)
GLUCOSE: 125 mg/dL — AB (ref 70–99)
Potassium: 3.8 mmol/L (ref 3.5–5.1)
Sodium: 138 mmol/L (ref 135–145)

## 2018-01-19 LAB — HCG, SERUM, QUALITATIVE: Preg, Serum: NEGATIVE

## 2018-01-19 LAB — GLUCOSE, CAPILLARY
GLUCOSE-CAPILLARY: 191 mg/dL — AB (ref 70–99)
Glucose-Capillary: 127 mg/dL — ABNORMAL HIGH (ref 70–99)
Glucose-Capillary: 173 mg/dL — ABNORMAL HIGH (ref 70–99)

## 2018-01-19 LAB — CBC
HCT: 36.2 % (ref 36.0–46.0)
Hemoglobin: 11.2 g/dL — ABNORMAL LOW (ref 12.0–15.0)
MCH: 28.7 pg (ref 26.0–34.0)
MCHC: 30.9 g/dL (ref 30.0–36.0)
MCV: 92.8 fL (ref 78.0–100.0)
PLATELETS: 386 10*3/uL (ref 150–400)
RBC: 3.9 MIL/uL (ref 3.87–5.11)
RDW: 14.9 % (ref 11.5–15.5)
WBC: 11.9 10*3/uL — AB (ref 4.0–10.5)

## 2018-01-19 LAB — TYPE AND SCREEN
ABO/RH(D): O POS
Antibody Screen: NEGATIVE

## 2018-01-19 SURGERY — CRANIOTOMY TUMOR EXCISION
Anesthesia: General

## 2018-01-19 MED ORDER — FENTANYL CITRATE (PF) 250 MCG/5ML IJ SOLN
INTRAMUSCULAR | Status: AC
  Filled 2018-01-19: qty 5

## 2018-01-19 MED ORDER — LABETALOL HCL 5 MG/ML IV SOLN
10.0000 mg | INTRAVENOUS | Status: DC | PRN
  Administered 2018-01-19 (×2): 20 mg via INTRAVENOUS
  Filled 2018-01-19 (×2): qty 4

## 2018-01-19 MED ORDER — FLEET ENEMA 7-19 GM/118ML RE ENEM: 1.0000 | ENEMA | Freq: Once | RECTAL | Status: DC | PRN

## 2018-01-19 MED ORDER — FENTANYL CITRATE (PF) 250 MCG/5ML IJ SOLN
INTRAMUSCULAR | Status: DC | PRN
  Administered 2018-01-19 (×6): 50 ug via INTRAVENOUS

## 2018-01-19 MED ORDER — ACETAMINOPHEN 325 MG PO TABS
650.0000 mg | ORAL_TABLET | ORAL | Status: DC | PRN
  Administered 2018-01-21: 650 mg via ORAL
  Filled 2018-01-19: qty 2

## 2018-01-19 MED ORDER — ALBUTEROL SULFATE HFA 108 (90 BASE) MCG/ACT IN AERS
INHALATION_SPRAY | RESPIRATORY_TRACT | Status: DC | PRN
  Administered 2018-01-19: 3 via RESPIRATORY_TRACT

## 2018-01-19 MED ORDER — CHLORHEXIDINE GLUCONATE CLOTH 2 % EX PADS: 6.0000 | MEDICATED_PAD | Freq: Once | CUTANEOUS | Status: DC

## 2018-01-19 MED ORDER — SENNOSIDES-DOCUSATE SODIUM 8.6-50 MG PO TABS: 1.0000 | ORAL_TABLET | Freq: Every evening | ORAL | Status: DC | PRN

## 2018-01-19 MED ORDER — EVICEL 5 ML EX KIT
PACK | CUTANEOUS | Status: AC
  Filled 2018-01-19: qty 1

## 2018-01-19 MED ORDER — PROMETHAZINE HCL 12.5 MG PO TABS
12.5000 mg | ORAL_TABLET | ORAL | Status: DC | PRN
  Filled 2018-01-19: qty 2

## 2018-01-19 MED ORDER — SODIUM CHLORIDE 0.9 % IV SOLN
INTRAVENOUS | Status: DC
  Administered 2018-01-19 – 2018-01-21 (×5): via INTRAVENOUS

## 2018-01-19 MED ORDER — ONDANSETRON HCL 4 MG/2ML IJ SOLN
INTRAMUSCULAR | Status: DC | PRN
  Administered 2018-01-19: 4 mg via INTRAVENOUS

## 2018-01-19 MED ORDER — BUPIVACAINE HCL (PF) 0.5 % IJ SOLN
INTRAMUSCULAR | Status: AC
  Filled 2018-01-19: qty 30

## 2018-01-19 MED ORDER — PROPOFOL 10 MG/ML IV BOLUS
INTRAVENOUS | Status: AC
  Filled 2018-01-19: qty 20

## 2018-01-19 MED ORDER — THROMBIN 5000 UNITS EX SOLR
OROMUCOSAL | Status: DC | PRN
  Administered 2018-01-19: 09:00:00 via TOPICAL

## 2018-01-19 MED ORDER — ACETAMINOPHEN 650 MG RE SUPP: 650.0000 mg | RECTAL | Status: DC | PRN

## 2018-01-19 MED ORDER — SUGAMMADEX SODIUM 200 MG/2ML IV SOLN
INTRAVENOUS | Status: DC | PRN
  Administered 2018-01-19: 300 mg via INTRAVENOUS

## 2018-01-19 MED ORDER — SALINE SPRAY 0.65 % NA SOLN
4.0000 | NASAL | Status: DC | PRN
  Administered 2018-01-20 (×2): 4 via NASAL
  Filled 2018-01-19: qty 44

## 2018-01-19 MED ORDER — CLINDAMYCIN PHOSPHATE 300 MG/50ML IV SOLN
300.0000 mg | Freq: Four times a day (QID) | INTRAVENOUS | Status: AC
  Administered 2018-01-19 – 2018-01-20 (×3): 300 mg via INTRAVENOUS
  Filled 2018-01-19 (×3): qty 50

## 2018-01-19 MED ORDER — OXYMETAZOLINE HCL 0.05 % NA SOLN
NASAL | Status: AC
  Filled 2018-01-19: qty 30

## 2018-01-19 MED ORDER — LIDOCAINE HCL (CARDIAC) PF 100 MG/5ML IV SOSY
PREFILLED_SYRINGE | INTRAVENOUS | Status: DC | PRN
  Administered 2018-01-19: 80 mg via INTRAVENOUS

## 2018-01-19 MED ORDER — LABETALOL HCL 5 MG/ML IV SOLN
5.0000 mg | INTRAVENOUS | Status: AC | PRN
  Administered 2018-01-19 (×2): 10 mg via INTRAVENOUS

## 2018-01-19 MED ORDER — LABETALOL HCL 5 MG/ML IV SOLN
5.0000 mg | INTRAVENOUS | Status: AC | PRN
  Administered 2018-01-19: 10 mg via INTRAVENOUS

## 2018-01-19 MED ORDER — THROMBIN 5000 UNITS EX SOLR
CUTANEOUS | Status: AC
  Filled 2018-01-19: qty 5000

## 2018-01-19 MED ORDER — EVICEL 2 ML EX KIT
PACK | CUTANEOUS | Status: DC | PRN
  Administered 2018-01-19: 1

## 2018-01-19 MED ORDER — PROPOFOL 10 MG/ML IV BOLUS
INTRAVENOUS | Status: AC
  Filled 2018-01-19: qty 40

## 2018-01-19 MED ORDER — LEVOFLOXACIN 500 MG PO TABS: 500.0000 mg | ORAL_TABLET | Freq: Every day | ORAL | 0 refills | Status: DC

## 2018-01-19 MED ORDER — SODIUM CHLORIDE 0.9 % IV SOLN
INTRAVENOUS | Status: DC | PRN
  Administered 2018-01-19: 07:00:00 via INTRAVENOUS

## 2018-01-19 MED ORDER — LABETALOL HCL 5 MG/ML IV SOLN: 10.0000 mg | INTRAVENOUS | Status: DC | PRN

## 2018-01-19 MED ORDER — EVICEL 2 ML EX KIT
PACK | CUTANEOUS | Status: AC
  Filled 2018-01-19: qty 1

## 2018-01-19 MED ORDER — LEVETIRACETAM IN NACL 500 MG/100ML IV SOLN
500.0000 mg | Freq: Two times a day (BID) | INTRAVENOUS | Status: DC
  Administered 2018-01-19 – 2018-01-22 (×6): 500 mg via INTRAVENOUS
  Filled 2018-01-19 (×6): qty 100

## 2018-01-19 MED ORDER — HYDROCORTISONE 5 MG/ML ORAL SUSPENSION
50.0000 mg | Freq: Once | ORAL | Status: AC
  Administered 2018-01-19: 50 mg via ORAL
  Filled 2018-01-19: qty 10

## 2018-01-19 MED ORDER — FENTANYL CITRATE (PF) 100 MCG/2ML IJ SOLN
INTRAMUSCULAR | Status: AC
  Filled 2018-01-19: qty 2

## 2018-01-19 MED ORDER — NALOXONE HCL 0.4 MG/ML IJ SOLN: 0.0800 mg | INTRAMUSCULAR | Status: DC | PRN

## 2018-01-19 MED ORDER — OXYMETAZOLINE HCL 0.05 % NA SOLN
1.0000 | Freq: Two times a day (BID) | NASAL | Status: DC
  Administered 2018-01-19 – 2018-01-23 (×7): 1 via NASAL
  Filled 2018-01-19 (×4): qty 15

## 2018-01-19 MED ORDER — ONDANSETRON HCL 4 MG PO TABS
4.0000 mg | ORAL_TABLET | ORAL | Status: DC | PRN
  Administered 2018-01-21: 4 mg via ORAL
  Filled 2018-01-19: qty 1

## 2018-01-19 MED ORDER — HYDROCODONE-ACETAMINOPHEN 5-325 MG PO TABS
1.0000 | ORAL_TABLET | ORAL | Status: DC | PRN
  Administered 2018-01-20 – 2018-01-23 (×6): 1 via ORAL
  Filled 2018-01-19 (×6): qty 1

## 2018-01-19 MED ORDER — SODIUM CHLORIDE 0.9 % IV SOLN
INTRAVENOUS | Status: DC | PRN
  Administered 2018-01-19 (×2): via INTRAVENOUS

## 2018-01-19 MED ORDER — PHENYLEPHRINE HCL 10 MG/ML IJ SOLN
INTRAMUSCULAR | Status: DC | PRN
  Administered 2018-01-19: 80 ug via INTRAVENOUS

## 2018-01-19 MED ORDER — TRIAMCINOLONE ACETONIDE 40 MG/ML IJ SUSP
INTRAMUSCULAR | Status: AC
  Filled 2018-01-19: qty 5

## 2018-01-19 MED ORDER — DEXMEDETOMIDINE HCL 200 MCG/2ML IV SOLN
INTRAVENOUS | Status: DC | PRN
  Administered 2018-01-19 (×2): 4 ug via INTRAVENOUS

## 2018-01-19 MED ORDER — CIPROFLOXACIN IN D5W 400 MG/200ML IV SOLN
400.0000 mg | INTRAVENOUS | Status: AC
  Administered 2018-01-19: 400 mg via INTRAVENOUS
  Filled 2018-01-19: qty 200

## 2018-01-19 MED ORDER — HYDROMORPHONE HCL 1 MG/ML IJ SOLN
0.5000 mg | INTRAMUSCULAR | Status: DC | PRN
  Administered 2018-01-19: 0.5 mg via INTRAVENOUS
  Filled 2018-01-19: qty 1

## 2018-01-19 MED ORDER — SODIUM CHLORIDE 0.9 % IR SOLN
Status: DC | PRN
  Administered 2018-01-19 (×2): 1000 mL

## 2018-01-19 MED ORDER — LABETALOL HCL 5 MG/ML IV SOLN
INTRAVENOUS | Status: DC | PRN
  Administered 2018-01-19 (×3): 5 mg via INTRAVENOUS

## 2018-01-19 MED ORDER — MUPIROCIN CALCIUM 2 % EX CREA
TOPICAL_CREAM | CUTANEOUS | Status: AC
  Filled 2018-01-19: qty 15

## 2018-01-19 MED ORDER — LIDOCAINE-EPINEPHRINE 1 %-1:100000 IJ SOLN
INTRAMUSCULAR | Status: DC | PRN
  Administered 2018-01-19: 9 mL

## 2018-01-19 MED ORDER — INSULIN ASPART 100 UNIT/ML ~~LOC~~ SOLN
0.0000 [IU] | Freq: Three times a day (TID) | SUBCUTANEOUS | Status: DC
  Administered 2018-01-20 (×2): 3 [IU] via SUBCUTANEOUS

## 2018-01-19 MED ORDER — HYDROCORTISONE NA SUCCINATE PF 100 MG IJ SOLR
INTRAMUSCULAR | Status: DC | PRN
  Administered 2018-01-19: 100 mg via INTRAVENOUS

## 2018-01-19 MED ORDER — MIDAZOLAM HCL 2 MG/2ML IJ SOLN
INTRAMUSCULAR | Status: AC
  Filled 2018-01-19: qty 2

## 2018-01-19 MED ORDER — OXYMETAZOLINE HCL 0.05 % NA SOLN
NASAL | Status: DC | PRN
  Administered 2018-01-19: 1

## 2018-01-19 MED ORDER — ONDANSETRON HCL 4 MG/2ML IJ SOLN: 4.0000 mg | INTRAMUSCULAR | Status: DC | PRN

## 2018-01-19 MED ORDER — FENTANYL CITRATE (PF) 100 MCG/2ML IJ SOLN
25.0000 ug | INTRAMUSCULAR | Status: DC | PRN
  Administered 2018-01-19: 25 ug via INTRAVENOUS

## 2018-01-19 MED ORDER — LABETALOL HCL 5 MG/ML IV SOLN
INTRAVENOUS | Status: AC
  Filled 2018-01-19: qty 4

## 2018-01-19 MED ORDER — ROCURONIUM BROMIDE 100 MG/10ML IV SOLN
INTRAVENOUS | Status: DC | PRN
  Administered 2018-01-19: 20 mg via INTRAVENOUS
  Administered 2018-01-19: 10 mg via INTRAVENOUS
  Administered 2018-01-19: 50 mg via INTRAVENOUS
  Administered 2018-01-19: 20 mg via INTRAVENOUS

## 2018-01-19 MED ORDER — LIDOCAINE-EPINEPHRINE 1 %-1:100000 IJ SOLN
INTRAMUSCULAR | Status: AC
  Filled 2018-01-19: qty 2

## 2018-01-19 MED ORDER — 0.9 % SODIUM CHLORIDE (POUR BTL) OPTIME
TOPICAL | Status: DC | PRN
  Administered 2018-01-19: 2000 mL

## 2018-01-19 MED ORDER — MUPIROCIN 2 % EX OINT
TOPICAL_OINTMENT | CUTANEOUS | Status: AC
  Filled 2018-01-19: qty 22

## 2018-01-19 MED ORDER — BACITRACIN ZINC 500 UNIT/GM EX OINT
TOPICAL_OINTMENT | CUTANEOUS | Status: AC
  Filled 2018-01-19: qty 28.35

## 2018-01-19 MED ORDER — DOCUSATE SODIUM 100 MG PO CAPS
100.0000 mg | ORAL_CAPSULE | Freq: Two times a day (BID) | ORAL | Status: DC
  Administered 2018-01-19 – 2018-01-23 (×8): 100 mg via ORAL
  Filled 2018-01-19 (×8): qty 1

## 2018-01-19 MED ORDER — PROPOFOL 10 MG/ML IV BOLUS
INTRAVENOUS | Status: DC | PRN
  Administered 2018-01-19: 150 mg via INTRAVENOUS
  Administered 2018-01-19 (×2): 20 mg via INTRAVENOUS
  Administered 2018-01-19: 30 mg via INTRAVENOUS

## 2018-01-19 MED ORDER — BISACODYL 5 MG PO TBEC: 5.0000 mg | DELAYED_RELEASE_TABLET | Freq: Every day | ORAL | Status: DC | PRN

## 2018-01-19 SURGICAL SUPPLY — 139 items
ATTRACTOMAT 16X20 MAGNETIC DRP (DRAPES) IMPLANT
BENZOIN TINCTURE PRP APPL 2/3 (GAUZE/BANDAGES/DRESSINGS) IMPLANT
BLADE CLIPPER SURG (BLADE) ×2 IMPLANT
BLADE ROTATE RAD 40 4 M4 (BLADE) IMPLANT
BLADE ROTATE TRICUT 4X13 M4 (BLADE) ×2 IMPLANT
BLADE SAW GIGLI 16 STRL (MISCELLANEOUS) IMPLANT
BLADE SURG 15 STRL LF DISP TIS (BLADE) IMPLANT
BLADE SURG 15 STRL SS (BLADE)
BLADE ULTRA TIP 2M (BLADE) IMPLANT
BNDG GAUZE ELAST 4 BULKY (GAUZE/BANDAGES/DRESSINGS) IMPLANT
BNDG STRETCH 4X75 STRL LF (GAUZE/BANDAGES/DRESSINGS) IMPLANT
BUR ACORN 6.0 PRECISION (BURR) IMPLANT
BUR DIAMOND 13X5 70D (BURR) IMPLANT
BUR DIAMOND CURV 15X5 15D (BURR) ×2 IMPLANT
BUR ROUND FLUTED 4 SOFT TCH (BURR) IMPLANT
BUR SPIRAL ROUTER 2.3 (BUR) IMPLANT
CANISTER SUCT 3000ML PPV (MISCELLANEOUS) ×2 IMPLANT
CARTRIDGE OIL MAESTRO DRILL (MISCELLANEOUS) IMPLANT
CATH VENTRIC 35X38 W/TROCAR LG (CATHETERS) IMPLANT
CLIP VESOCCLUDE MED 6/CT (CLIP) IMPLANT
COAGULATOR SUCT 8FR VV (MISCELLANEOUS) ×2 IMPLANT
COAGULATOR SUCT SWTCH 10FR 6 (ELECTROSURGICAL) IMPLANT
CONT SPEC 4OZ CLIKSEAL STRL BL (MISCELLANEOUS) ×2 IMPLANT
COVER MAYO STAND STRL (DRAPES) IMPLANT
DECANTER SPIKE VIAL GLASS SM (MISCELLANEOUS) IMPLANT
DERMABOND ADVANCED (GAUZE/BANDAGES/DRESSINGS) ×1
DERMABOND ADVANCED .7 DNX12 (GAUZE/BANDAGES/DRESSINGS) ×1 IMPLANT
DIFFUSER DRILL AIR PNEUMATIC (MISCELLANEOUS) IMPLANT
DRAIN SUBARACHNOID (WOUND CARE) IMPLANT
DRAPE HALF SHEET 40X57 (DRAPES) ×2 IMPLANT
DRAPE MICROSCOPE LEICA (MISCELLANEOUS) ×2 IMPLANT
DRAPE NEUROLOGICAL W/INCISE (DRAPES) ×2 IMPLANT
DRAPE STERI IOBAN 125X83 (DRAPES) IMPLANT
DRAPE SURG 17X23 STRL (DRAPES) IMPLANT
DRAPE WARM FLUID 44X44 (DRAPE) ×2 IMPLANT
DRESSING NASAL KENNEDY 3.5X.9 (MISCELLANEOUS) IMPLANT
DRESSING NASAL POPE 10X1.5X2.5 (GAUZE/BANDAGES/DRESSINGS) IMPLANT
DRSG ADAPTIC 3X8 NADH LF (GAUZE/BANDAGES/DRESSINGS) IMPLANT
DRSG NASAL KENNEDY 3.5X.9 (MISCELLANEOUS)
DRSG NASAL POPE 10X1.5X2.5 (GAUZE/BANDAGES/DRESSINGS)
DRSG NASOPORE 8CM (GAUZE/BANDAGES/DRESSINGS) ×2 IMPLANT
DRSG TELFA 3X8 NADH (GAUZE/BANDAGES/DRESSINGS) IMPLANT
DURAPREP 6ML APPLICATOR 50/CS (WOUND CARE) ×2 IMPLANT
ELECT COATED BLADE 2.86 ST (ELECTRODE) IMPLANT
ELECT REM PT RETURN 9FT ADLT (ELECTROSURGICAL) ×2
ELECTRODE REM PT RTRN 9FT ADLT (ELECTROSURGICAL) ×1 IMPLANT
EVACUATOR 1/8 PVC DRAIN (DRAIN) IMPLANT
EVACUATOR SILICONE 100CC (DRAIN) IMPLANT
EVICEL AIRLESS SPRAY ACCES (MISCELLANEOUS) ×2 IMPLANT
FILTER ARTHROSCOPY CONVERTOR (FILTER) ×2 IMPLANT
FLUID NSS /IRRIG 1000 ML XXX (MISCELLANEOUS) ×4 IMPLANT
FORCEPS BIPOLAR SPETZLER 8 1.0 (NEUROSURGERY SUPPLIES) IMPLANT
GAUZE SPONGE 4X4 12PLY STRL (GAUZE/BANDAGES/DRESSINGS) ×2 IMPLANT
GAUZE SPONGE 4X4 16PLY XRAY LF (GAUZE/BANDAGES/DRESSINGS) IMPLANT
GLOVE BIO SURGEON STRL SZ7 (GLOVE) IMPLANT
GLOVE BIOGEL M 7.0 STRL (GLOVE) ×4 IMPLANT
GLOVE BIOGEL PI IND STRL 7.0 (GLOVE) IMPLANT
GLOVE BIOGEL PI IND STRL 7.5 (GLOVE) ×1 IMPLANT
GLOVE BIOGEL PI INDICATOR 7.0 (GLOVE)
GLOVE BIOGEL PI INDICATOR 7.5 (GLOVE) ×1
GLOVE ECLIPSE 7.0 STRL STRAW (GLOVE) ×4 IMPLANT
GLOVE EXAM NITRILE LRG STRL (GLOVE) IMPLANT
GLOVE EXAM NITRILE XL STR (GLOVE) IMPLANT
GLOVE EXAM NITRILE XS STR PU (GLOVE) IMPLANT
GOWN STRL REUS W/ TWL LRG LVL3 (GOWN DISPOSABLE) ×4 IMPLANT
GOWN STRL REUS W/ TWL XL LVL3 (GOWN DISPOSABLE) IMPLANT
GOWN STRL REUS W/TWL 2XL LVL3 (GOWN DISPOSABLE) IMPLANT
GOWN STRL REUS W/TWL LRG LVL3 (GOWN DISPOSABLE) ×4
GOWN STRL REUS W/TWL XL LVL3 (GOWN DISPOSABLE)
HEMOSTAT POWDER KIT SURGIFOAM (HEMOSTASIS) ×2 IMPLANT
HEMOSTAT SURGICEL 2X14 (HEMOSTASIS) IMPLANT
HOOK DURA 1/2IN (MISCELLANEOUS) IMPLANT
IV NS 1000ML (IV SOLUTION)
IV NS 1000ML BAXH (IV SOLUTION) IMPLANT
KIT BASIN OR (CUSTOM PROCEDURE TRAY) ×2 IMPLANT
KIT DRAIN CSF ACCUDRAIN (MISCELLANEOUS) IMPLANT
KIT TURNOVER KIT B (KITS) ×2 IMPLANT
KNIFE ARACHNOID DISP AM-21-S (BLADE) ×2 IMPLANT
KNIFE ARACHNOID DISP AM-24-S (MISCELLANEOUS) IMPLANT
NEEDLE 18GX1X1/2 (RX/OR ONLY) (NEEDLE) IMPLANT
NEEDLE HYPO 22GX1.5 SAFETY (NEEDLE) IMPLANT
NEEDLE HYPO 25GX1X1/2 BEV (NEEDLE) ×2 IMPLANT
NEEDLE SPNL 18GX3.5 QUINCKE PK (NEEDLE) IMPLANT
NS IRRIG 1000ML POUR BTL (IV SOLUTION) ×4 IMPLANT
OIL CARTRIDGE MAESTRO DRILL (MISCELLANEOUS)
PACK CRANIOTOMY CUSTOM (CUSTOM PROCEDURE TRAY) ×2 IMPLANT
PAD ARMBOARD 7.5X6 YLW CONV (MISCELLANEOUS) ×4 IMPLANT
PATTIES SURGICAL .25X.25 (GAUZE/BANDAGES/DRESSINGS) IMPLANT
PATTIES SURGICAL .5 X.5 (GAUZE/BANDAGES/DRESSINGS) IMPLANT
PATTIES SURGICAL .5 X3 (DISPOSABLE) IMPLANT
PATTIES SURGICAL 1/4 X 3 (GAUZE/BANDAGES/DRESSINGS) IMPLANT
PATTIES SURGICAL 1X1 (DISPOSABLE) IMPLANT
PENCIL BUTTON HOLSTER BLD 10FT (ELECTRODE) IMPLANT
PIN MAYFIELD SKULL DISP (PIN) IMPLANT
RUBBERBAND STERILE (MISCELLANEOUS) ×4 IMPLANT
SET TUBING W/EXT DISP (INSTRUMENTS) ×2 IMPLANT
SHEATH ENDOSCRUB 0 DEG (SHEATH) ×2 IMPLANT
SHEATH ENDOSCRUB 45 DEG (SHEATH) ×2 IMPLANT
SPECIMEN JAR SMALL (MISCELLANEOUS) ×2 IMPLANT
SPLINT NASAL DOYLE BI-VL (GAUZE/BANDAGES/DRESSINGS) IMPLANT
SPONGE NEURO XRAY DETECT 1X3 (DISPOSABLE) ×2 IMPLANT
SPONGE SURGIFOAM ABS GEL 100 (HEMOSTASIS) IMPLANT
STAPLER VISISTAT 35W (STAPLE) ×2 IMPLANT
STOCKINETTE 6  STRL (DRAPES)
STOCKINETTE 6 STRL (DRAPES) IMPLANT
SUT 5.0 PDS RB-1 (SUTURE)
SUT ETHILON 3 0 FSL (SUTURE) IMPLANT
SUT ETHILON 3 0 PS 1 (SUTURE) IMPLANT
SUT ETHILON 6 0 P 1 (SUTURE) IMPLANT
SUT NURALON 4 0 TR CR/8 (SUTURE) IMPLANT
SUT PDS AB 4-0 RB1 27 (SUTURE) IMPLANT
SUT PDS PLUS AB 5-0 RB-1 (SUTURE) IMPLANT
SUT PLAIN 4 0 ~~LOC~~ 1 (SUTURE) IMPLANT
SUT SILK 0 TIES 10X30 (SUTURE) IMPLANT
SUT VIC AB 0 CT1 18XCR BRD8 (SUTURE) IMPLANT
SUT VIC AB 0 CT1 8-18 (SUTURE)
SUT VIC AB 3-0 SH 8-18 (SUTURE) ×4 IMPLANT
SUT VIC AB 4-0 P-3 18X BRD (SUTURE) IMPLANT
SUT VIC AB 4-0 P3 18 (SUTURE)
SWAB COLLECTION DEVICE MRSA (MISCELLANEOUS) IMPLANT
SWAB CULTURE ESWAB REG 1ML (MISCELLANEOUS) IMPLANT
SYR CONTROL 10ML LL (SYRINGE) ×2 IMPLANT
TAPE CLOTH 1X10 TAN NS (GAUZE/BANDAGES/DRESSINGS) IMPLANT
TIP STRAIGHT 25KHZ (INSTRUMENTS) IMPLANT
TIP SUCTION MALLEABLE LG 20CM (ENT DISPOSABLE) ×2 IMPLANT
TOWEL GREEN STERILE (TOWEL DISPOSABLE) ×2 IMPLANT
TOWEL GREEN STERILE FF (TOWEL DISPOSABLE) ×2 IMPLANT
TOWEL NATURAL 6PK STERILE (DISPOSABLE) ×2 IMPLANT
TRACKER ENT INSTRUMENT (MISCELLANEOUS) ×2 IMPLANT
TRACKER ENT PATIENT (MISCELLANEOUS) ×2 IMPLANT
TRAP SPECIMEN MUCOUS 40CC (MISCELLANEOUS) IMPLANT
TRAY ENT MC OR (CUSTOM PROCEDURE TRAY) ×2 IMPLANT
TRAY FOLEY MTR SLVR 16FR STAT (SET/KITS/TRAYS/PACK) ×2 IMPLANT
TUBE CONNECTING 12X1/4 (SUCTIONS) ×4 IMPLANT
TUBING EXTENTION W/L.L. (IV SETS) ×2 IMPLANT
TUBING STRAIGHTSHOT EPS 5PK (TUBING) ×2 IMPLANT
UNDERPAD 30X30 (UNDERPADS AND DIAPERS) ×2 IMPLANT
WATER STERILE IRR 1000ML POUR (IV SOLUTION) ×2 IMPLANT
WIPE INSTRUMENT VISIWIPE 73X73 (MISCELLANEOUS) ×2 IMPLANT

## 2018-01-19 NOTE — Anesthesia Preprocedure Evaluation (Addendum)
Anesthesia Evaluation  Patient identified by MRN, date of birth, ID band  Reviewed: Allergy & Precautions, NPO status , Patient's Chart, lab work & pertinent test results  Airway Mallampati: II  TM Distance: >3 FB     Dental   Pulmonary asthma , pneumonia,    breath sounds clear to auscultation       Cardiovascular hypertension, + Peripheral Vascular Disease   Rhythm:Regular Rate:Normal     Neuro/Psych    GI/Hepatic Neg liver ROS, GERD  ,  Endo/Other  diabetes  Renal/GU negative Renal ROS     Musculoskeletal   Abdominal   Peds  Hematology   Anesthesia Other Findings   Reproductive/Obstetrics                             Anesthesia Physical Anesthesia Plan  ASA: III  Anesthesia Plan: General   Post-op Pain Management:    Induction: Intravenous  PONV Risk Score and Plan: 3 and Treatment may vary due to age or medical condition, Ondansetron, Dexamethasone and Midazolam  Airway Management Planned: Oral ETT  Additional Equipment:   Intra-op Plan:   Post-operative Plan: Possible Post-op intubation/ventilation  Informed Consent: I have reviewed the patients History and Physical, chart, labs and discussed the procedure including the risks, benefits and alternatives for the proposed anesthesia with the patient or authorized representative who has indicated his/her understanding and acceptance.   Dental advisory given  Plan Discussed with: CRNA and Anesthesiologist  Anesthesia Plan Comments:         Anesthesia Quick Evaluation

## 2018-01-19 NOTE — Anesthesia Procedure Notes (Signed)
Arterial Line Insertion Start/End6/28/2019 7:15 AM, 01/19/2018 7:20 AM Performed by: Lance Coon, CRNA, CRNA  Patient location: Pre-op. Preanesthetic checklist: patient identified, IV checked, site marked, risks and benefits discussed, surgical consent, monitors and equipment checked, pre-op evaluation, timeout performed and anesthesia consent Left, radial was placed Catheter size: 20 G Hand hygiene performed  and maximum sterile barriers used   Attempts: 1 Procedure performed without using ultrasound guided technique. Following insertion, dressing applied and Biopatch. Post procedure assessment: normal  Patient tolerated the procedure well with no immediate complications.

## 2018-01-19 NOTE — Anesthesia Procedure Notes (Signed)
Procedure Name: Intubation Date/Time: 01/19/2018 7:53 AM Performed by: Glynda Jaeger, CRNA Pre-anesthesia Checklist: Patient identified, Patient being monitored, Timeout performed, Emergency Drugs available and Suction available Patient Re-evaluated:Patient Re-evaluated prior to induction Oxygen Delivery Method: Circle System Utilized Preoxygenation: Pre-oxygenation with 100% oxygen Induction Type: IV induction Ventilation: Oral airway inserted - appropriate to patient size Laryngoscope Size: 4 and Glidescope Grade View: Grade I Tube type: Oral Tube size: 7.5 mm Number of attempts: 2 Airway Equipment and Method: Video-laryngoscopy Placement Confirmation: ETT inserted through vocal cords under direct vision,  positive ETCO2 and breath sounds checked- equal and bilateral Secured at: 21 cm Tube secured with: Tape Dental Injury: Teeth and Oropharynx as per pre-operative assessment

## 2018-01-19 NOTE — Anesthesia Postprocedure Evaluation (Signed)
Anesthesia Post Note  Patient: Mariah [name]  Procedure(s) Performed: ENDOSCOPIC TRANSPHENOIDAL RESECTION OF PITUITARY TUMOR (N/A ) TRANSPHENOIDAL  RESECTION OF PITUITARY TUMOR (N/A ) SINUS ENDO WITH FUSION (N/A )     Patient location during evaluation: PACU Anesthesia Type: General Level of consciousness: awake, lethargic and patient cooperative Pain management: pain level controlled Vital Signs Assessment: post-procedure vital signs reviewed and stable Respiratory status: spontaneous breathing, nonlabored ventilation, respiratory function stable and patient connected to nasal cannula oxygen Cardiovascular status: blood pressure returned to baseline and stable Postop Assessment: no apparent nausea or vomiting Anesthetic complications: no    Last Vitals:  Vitals:   01/19/18 1212 01/19/18 1216  BP: (!) 167/85 (!) 141/62  Pulse: 83 82  Resp: (!) 30 (!) 26  Temp:    SpO2: 99% 97%    Last Pain:  Vitals:   01/19/18 0631  TempSrc:   PainSc: 0-No pain                 Kaylina Cahue A.

## 2018-01-19 NOTE — H&P (Signed)
Chief Complaint   Pituitary adenoma   HPI   HPI: Mariah [name] is a 67 y.o. female who was found to have visual field defects during routine eye exam. MRI of brain was subsequently ordered and demonstrated a homogeneously enhancing pituitary lesion with compression of the optic apparatus. Ms. Racey herself had not noticed any visual changes. She does not report any blurry or double vision, or any noticeable change in her peripheral vision or tunnel vision.  She does not have any headaches.  She denies any new numbness tingling or weakness in the extremities.  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 She is without any concerns today.   Patient Active Problem List   Diagnosis Date Noted  . Hypoglycemia 11/07/2017  . 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 Vaginal bleeding 11/06/2017  . Acute encephalopathy 10/30/2017  . Postmenopausal bleeding 09/01/2017  . Screening for colon cancer 09/01/2017  . Thyroid nodule 09/01/2017  . Daytime sleepiness 08/15/2017  . Bilateral knee pain 08/15/2017  . Fall 08/15/2017  . Screening for breast cancer 05/25/2017  . NAFLD (nonalcoholic fatty liver disease) 05/24/2017  . Morbid obesity ([place]) 10/21/2012  . Type 2 diabetes mellitus treated without insulin ([place]) 09/18/2012  . FIBROIDS, UTERUS 07/10/2008  . Chronic venous stasis dermatitis of both lower extremities 07/10/2008  . Hypertension associated with diabetes ([place]) 04/26/2007  . Allergic rhinitis 04/26/2007  . Asthma 04/26/2007    PMH: Past Medical History:  Diagnosis Date  . Allergic rhinitis   . Arthritis    "ankles, feet, knees" (10/30/2017)  . Asthma   . Bursitis   . Carpal tunnel syndrome   . GERD (gastroesophageal reflux disease)   . HTN (hypertension)   . Migraine    "a couple/yr" (10/30/2017)  . Obesity   . Peripheral vascular  disease ([place])   . Pneumonia 1960s X 1  . Sciatica   . Tendinitis   . Type II diabetes mellitus ([facility])    Type II    PSH: Past Surgical History:  Procedure Laterality Date  . GANGLION CYST EXCISION Left   . KNEE ARTHROSCOPY Left 2003  . PLANTAR FASCIA RELEASE Right 1995   "heel"  . TONSILLECTOMY  1958  . [place]; 1997   Dr [name]    Medications Prior to Admission  Medication Sig Dispense Refill Last Dose  . albuterol (PROVENTIL HFA;VENTOLIN HFA) 108 (90 Base) MCG/ACT inhaler Inhale 2 puffs into the lungs every 6 (six) hours as needed for wheezing or shortness of breath. 18 g 2   . esomeprazole (NEXIUM) 20 MG capsule Take 1 capsule (20 mg total) by mouth every morning. For acid reflex 30 capsule 0   . Fluticasone-Salmeterol (ADVAIR DISKUS) 100-50 MCG/DOSE AEPB Inhale 1 puff into the lungs 2 (two) times daily. 60 each 2   . furosemide (LASIX) 40 MG tablet Take 1 tablet (40 mg total) by mouth daily. 90 tablet 0   . lisinopril (PRINIVIL,ZESTRIL) 40 MG tablet Take 40 mg by mouth daily.  11   . mometasone (NASONEX) 50 MCG/ACT nasal spray Place 1 spray into the nose as needed. (Patient taking differently: Place 1 spray into the nose as needed (allergies). ) 17 g 0   . naproxen (NAPROSYN) 500 MG tablet Take 1 tablet (500 mg total) by mouth 2 (two) times daily with a meal. (Patient taking differently:  Take 500 mg by mouth 2 (two) times daily as needed for moderate pain. ) 60 tablet 2 Taking  . amLODipine (NORVASC) 10 MG tablet TAKE ONE TABLET BY MOUTH EVERY DAY 30 tablet 5   . AQUALANCE LANCETS 30G MISC USE AS DIRECTED TWICE DAILY 100 each 2   . glipiZIDE (GLUCOTROL XL) 10 MG 24 hr tablet Take 2 tablets (20 mg total) by mouth daily. 60 tablet 1   . losartan (COZAAR) 100 MG tablet Take 1 tablet (100 mg total) by mouth daily. (Patient not taking: Reported on 12/13/2017) 30 tablet 0 Not Taking at Unknown time  . sitaGLIPtin-metformin (JANUMET) 50-1000 MG tablet Take 1 tablet by  mouth 2 (two) times daily with a meal. 180 tablet 0   . TRUE METRIX BLOOD GLUCOSE TEST test strip USE AS DIRECTED TWICE DAILY 50 each 2     SH: Social History   Tobacco Use  . Smoking status: Never Smoker  . Smokeless tobacco: Never Used  Substance Use Topics  . Alcohol use: No  . Drug use: No    MEDS: Prior to Admission medications   Medication Sig Start Date End Date Taking? Authorizing Provider  albuterol (PROVENTIL HFA;VENTOLIN HFA) 108 (90 Base) MCG/ACT inhaler Inhale 2 puffs into the lungs every 6 (six) hours as needed for wheezing or shortness of breath. 11/17/17  Yes [name], MD  esomeprazole (NEXIUM) 20 MG capsule Take 1 capsule (20 mg total) by mouth every morning. For acid reflex 11/16/17  Yes Medina-Vargas, Monina C, NP  Fluticasone-Salmeterol (ADVAIR DISKUS) 100-50 MCG/DOSE AEPB Inhale 1 puff into the lungs 2 (two) times daily. 12/12/17  Yes [name], MD  furosemide (LASIX) 40 MG tablet Take 1 tablet (40 mg total) by mouth daily. 12/11/17  Yes [name], MD  lisinopril (PRINIVIL,ZESTRIL) 40 MG tablet Take 40 mg by mouth daily. 12/04/17  Yes [provider]  mometasone (NASONEX) 50 MCG/ACT nasal spray Place 1 spray into the nose as needed. Patient taking differently: Place 1 spray into the nose as needed (allergies).  12/12/17  Yes [name], MD  naproxen (NAPROSYN) 500 MG tablet Take 1 tablet (500 mg total) by mouth 2 (two) times daily with a meal. Patient taking differently: Take 500 mg by mouth 2 (two) times daily as needed for moderate pain.  08/15/17 08/15/18 Yes Alphonzo Grieve, MD  amLODipine (NORVASC) 10 MG tablet TAKE ONE TABLET BY MOUTH EVERY DAY 01/02/18   [name], MD  AQUALANCE LANCETS 30G MISC USE AS DIRECTED TWICE DAILY 11/17/17   [name], MD  glipiZIDE (GLUCOTROL XL) 10 MG 24 hr tablet Take 2 tablets (20 mg total) by mouth daily. 01/08/18   [name], MD  losartan (COZAAR) 100 MG tablet Take 1 tablet (100 mg total) by mouth  daily. Patient not taking: Reported on 12/13/2017 11/16/17   Medina-Vargas, Monina C, NP  sitaGLIPtin-metformin (JANUMET) 50-1000 MG tablet Take 1 tablet by mouth 2 (two) times daily with a meal. 01/10/18   [name], MD  TRUE METRIX BLOOD GLUCOSE TEST test strip USE AS DIRECTED TWICE DAILY 11/17/17   [name], MD    ALLERGY: Allergies  Allergen Reactions  . Penicillins Hives and Other (See Comments)    Has patient had a PCN reaction causing immediate rash, facial/tongue/throat swelling, SOB or lightheadedness with hypotension: No Has patient had a PCN reaction causing severe rash involving mucus membranes or skin necrosis: No PATIENT HAS HAD A PCN REACTION THAT REQUIRED HOSPITALIZATION:  #  #  YES  #  #  Has patient had a PCN reaction occurring within the last 10 years: No   . Codeine Nausea And Vomiting  . Indomethacin Diarrhea    Social History   Tobacco Use  . Smoking status: Never Smoker  . Smokeless tobacco: Never Used  Substance Use Topics  . Alcohol use: No     Family History  Problem Relation Age of Onset  . Uterine cancer Mother   . Hypertension Mother   . Heart disease Mother   . Hypertension Father   . Alzheimer's disease Father   . Cancer Other      ROS   ROS  Exam   There were no vitals filed for this visit. General appearance: edlerly female, NAD Eyes: PERRL, Fundoscopic: normal Cardiovascular: Regular rate and rhythm without murmurs, rubs, gallops. No edema or variciosities. Distal pulses normal. Pulmonary: Clear to auscultation Musculoskeletal:     Muscle tone upper extremities: Normal    Muscle tone lower extremities: Normal    Motor exam: Upper Extremities Deltoid Bicep Tricep Grip  Right 5/5 5/5 5/5 5/5  Left 5/5 5/5 5/5 5/5   Lower Extremity IP Quad PF DF EHL  Right 5/5 5/5 5/5 5/5 5/5  Left 5/5 5/5 5/5 5/5 5/5   Neurological Awake, alert, oriented Memory and concentration grossly intact Speech fluent, appropriate CNII: Visual  fields decreased peripherally CNIII/IV/VI: EOMI CNV: Facial sensation normal CNVII: Symmetric, normal strength CNVIII: Grossly normal CNIX: Normal palate movement CNXI: Trap and SCM strength normal CN XII: Tongue protrusion normal Sensation grossly intact to LT DTR: Normal Coordination (finger/nose & heel/shin): Normal  Results - Imaging/Labs   No results found for this or any previous visit (from the past 48 hour(s)).  No results found.  Impression/Plan   67 y.o. female with large pituitary lesion with suprasellar and cavernous sinus extension and associated bitemporal hemianopsia on Humphrey visual field testing.  She presents today for surgical resection for decompression of the optic apparatus and concrete diagnosis via endoscopic transsphenoidal resection of the tumor . Dr Wilburn Cornelia, ENT has also seen and evaluated the patient. He will be co-surgeon for the case.  While in the office, Dr Kathyrn Sheriff discussed risks of the surgery were discussed in detail including the risk of cranial nerve injury, stroke, or carotid artery injury leading to new numbness, weakness, paralysis, coma, or death.  We also discussed the risk of possible endocrine dysfunction postoperatively including the possible need for hormone supplementation.  Longer term possibilities of tumor growth or recurrence was also discussed including the possible need for additional surgeries in the future.  Patient appeared to understand our discussion.  All her questions were answered.  She is willing to proceed as above. Consent signed.

## 2018-01-19 NOTE — Progress Notes (Signed)
Pt came to unit with very clear urine and a significant amount.  Tested urine specific gravity which was 1.004.  Notified Dr. Kathyrn Sheriff of this.  Also notified him of her BP cuff and a-line not correlating.  OK to go by cuff.  He said OK to start cardene if labetalol pushes not keeping BP consistently lower than 160.  Will continue to monitor.

## 2018-01-19 NOTE — Addendum Note (Signed)
Addendum  created 01/19/18 1328 by Glynda Jaeger, CRNA   Charge Capture section accepted

## 2018-01-19 NOTE — Transfer of Care (Signed)
Immediate Anesthesia Transfer of Care Note  Patient: Mariah [name]  Procedure(s) Performed: ENDOSCOPIC TRANSPHENOIDAL RESECTION OF PITUITARY TUMOR (N/A ) TRANSPHENOIDAL  RESECTION OF PITUITARY TUMOR (N/A ) SINUS ENDO WITH FUSION (N/A )  Patient Location: PACU  Anesthesia Type:General  Level of Consciousness: drowsy, patient cooperative and responds to stimulation  Airway & Oxygen Therapy: Patient Spontanous Breathing and Patient connected to face mask oxygen  Post-op Assessment: Report given to RN, Post -op Vital signs reviewed and stable and Patient moving all extremities X 4  Post vital signs: Reviewed and stable  Last Vitals:  Vitals Value Taken Time  BP 150/76 01/19/2018 11:57 AM  Temp    Pulse 84 01/19/2018 12:00 PM  Resp 20 01/19/2018 12:00 PM  SpO2 98 % 01/19/2018 12:00 PM  Vitals shown include unvalidated device data.  Last Pain:  Vitals:   01/19/18 0631  TempSrc:   PainSc: 0-No pain         Complications: No apparent anesthesia complications

## 2018-01-19 NOTE — Op Note (Signed)
Operative Note:  ENDOSCOPIC TRANSSPHENOIDAL PITUITARY RESECTION WITH NAVIGATION      Patient: Mariah [name]  Medical record number: [id number]  Date:01/19/2018  Pre-operative Indications: 1.  Pituitary Mass     2.  Bilateral sinus disease       Postoperative Indications: Same  Surgical Procedure: 1. Endoscopic transsphenoidal pituitary resection with intraoperative navigation    2. Bilateral Endoscopic Sinus Surgery: Bil. Total Ethmoidectomy, Bil. Maxillary Antrostomy, Bil Sphenoidotomy      Anesthesia: GET  Surgeon: Delsa Bern, M.D.  Neurosurgeon: Dr. Kathyrn Sheriff  Complications: None  EBL: 300 cc  Findings: Mucosal disease bil ethmoid sinuses. Naso-pore sphenoid packing placed.  Note: The neurosurgical component of the operative procedure is dictated as a separate operative note.   Brief History: The patient is a 67 y.o. female with a history of pituitary mass. The patient has a history of headache and visual changes. The patient was referred to Dr. Kathyrn Sheriff for neurosurgical evaluation.  Patient seen by me at [facility] preoperatively with review of nasal anatomy and sinus CT scan for navigation.  The patient has a history of significant sinusitis and on scan had findings c/w acute sinusitis, she was pre-treated with antibiotics. Given the patient's history and findings, the above surgical procedures were recommended, risks and benefits were discussed in detail with the patient.  They understand and agree with our plan for surgery which is scheduled at [facility] under general anesthesia.  Surgical Procedure: The patient is brought to the neurosurgical operating room on 01/19/2018 and placed in supine position on the operating table. General endotracheal anesthesia was established without difficulty. When the patient was adequately anesthetized, surgical timeout was performed with correct identification of the patient and the surgical procedure. The patient's  nose was then injected with  9 cc of 1% lidocaine 1:100,000 dilution epinephrine which was injected in a submucosal fashion. The patient's nose was then packed with Afrin-soaked cottonoid pledgets were left in place for approximately 10 minutes to allow for vasoconstriction and hemostasis.  The Xomed Fusion navigation headgear was applied and anatomic and surgical landmarks were identified and confirmed, navigation was used throughout the sinus component of the surgical procedure.  With the patient prepped draped and prepared for surgery, nasal endoscopy was performed on the patient's right.  The middle turbinate was carefully lateralized to allow access to the posterior aspect of the nasal passageway.  The right sphenoid sinus ostium was identified.  The inferior aspect of the superior turbinate was then resected with through-cutting forceps and a microdebrider.  The right sphenoid sinus ostium was enlarged in a superior and lateral direction using the microdebrider and through-cutting forceps to create a widely patent ostium.  Nasal endoscopy on the patient's left-hand side was then undertaken.  The left middle turbinate was lateralized and the posterior nasal cavity was visualized with identification of the left sphenoid sinus ostium using navigation.  The inferior aspect of the superior turbinate was resected and the sinus ostium was enlarged in the lateral and superior direction to create a wide sphenoid sinus ostium.  A posterior septectomy was then performed with a Surveyor, quantity.  Bone, cartilage and soft tissue was then resected to create a wide posterior septotomy.  The anterior face of the sphenoid sinus and sphenoid sinus septum were then resected with a combination of through-cutting forceps, osteotome and microdebrider to allow direct access to the entire posterior aspect of the sphenoid sinus and pituitary fossa.  Sphenoid sinus mucosa overlying the pituitary fossa  was elevated and lateralized.   The anterior face of the pituitary fossa was demarcated using navigation.  With adequate access to the pituitary fossa the neurosurgical component of the procedure was begun by Dr. Kathyrn Sheriff.  This is dictated as a separate operative report.  Resection of the pituitary tumor was undertaken using direct visualization of the 0 degree endoscope, navigation and blunt and sharp dissection.  With pituitary tumor resection completed, reconstruction was undertaken. Abdominal fat graft placed and Tis-seal used over graft.  Surgicel was placed over the pituitary fossa defect and the sphenoid sinus was loosely packed with Naso-pore absorbable nasal packing.  There was no active bleeding and no evidence of spinal fluid leak.  The sphenoid sinus was carefully inspected, no further bleeding along the mucosal margins, sphenoidotomy sites or posterior septectomy.  The patient's nasal cavity was irrigated and suctioned.  Surgical sponge count was correct. An oral gastric tube was passed and the stomach contents were aspirated. Patient was awakened from anesthetic and transferred from the operating room to the recovery room in stable condition. There were no complications and blood loss was 300 cc.   Delsa Bern, M.D. [facility] 01/19/2018

## 2018-01-19 NOTE — Op Note (Signed)
NEUROSURGERY OPERATIVE NOTE   PREOP DIAGNOSIS:  1. Sellar/suprasellar tumor   POSTOP DIAGNOSIS: Same  PROCEDURE: 1. Endoscopic transsphenoidal resection of pituitary tumor 2. Harvest of abdominal fat graft  SURGEON: Dr. Consuella Lose, MD  CO-SURGEON: Dr. Jerrell Belfast, MD  ANESTHESIA: General Endotracheal  EBL: 300cc  SPECIMENS: Nasal mucosa, pituitary tumor  DRAINS: None  COMPLICATIONS: None immediate  CONDITION: Hemodynamically stable to PACU  HISTORY: Mariah [name] is a 67 y.o. female initially seen in the outpatient neurosurgery clinic with visual field cut.  She had undergone MRI which demonstrated a large pituitary tumor with left cavernous sinus and suprasellar extension and compression of the optic apparatus.  Surgical decompression for diagnosis and relief of mass-effect was therefore indicated.  The risks and benefits of the surgery were explained in detail to the patient and her family.  After all questions were answered informed consent was obtained and witnessed.  PROCEDURE IN DETAIL: The patient was brought to the operating room. After induction of general anesthesia, the patient was positioned on the operative table in the supine position. All pressure points were meticulously padded.  Stereotactic preoperative CT scan was fused with the preoperative MRI scan.  The scans were then used to call register with surface markers until a satisfactory accuracy was achieved.  After timeout was conducted, approach to the sella was obtained by Dr. Wilburn Cornelia.  Details of the approach are dictated in a separate report.  Once we had endoscopic access to the anterior face of the sella turcica, high-speed drill was used to drill out the face of the sella.  [place] were then used to enlarge the opening.  The dura was then incised in cruciate fashion.  Immediately beneath the dura we did identify somewhat firm tissue which did appear to be tumor, unlikely to be normal  pituitary gland.  Using a combination of microdissectors and ring curettes, tumor was initially removed from the inferior portion of the sella.  Specimens were collected and sent for permanent pathology.  After tumor was removed from the inferior portion of the sella, attention was then turned towards the patient's right.  After removal of this portion of the tumor, we were able to identify the medial wall of the cavernous sinus on the right.  In a similar fashion, ring curettes were used to restart to remove tumor from the patient's left side.  In removal of the last few pieces of tumor, we did identify some egress of CSF.  We then began to remove tumor superiorly.  At this point, we were able to identify the pulsations of the diaphragm sella above.  The  Angled endoscope was then inserted into the sella turcica, and under direct visualization, we were able to see the medial wall of the cavernous sinus on the right, what appeared to be the medial wall of the cavernous sinus on the left, and the diaphragm sella above.  There is no identifiable tumor remaining.  Because of the very slight CSF leak, we did elect to harvested abdominal fat graft.  The skin of the right side of the abdomen was prepped and draped at the beginning of the case.  A linear skin incision was made and Bovie electrocautery was used to dissect through subcutaneous tissue.  Subcutaneous fat was then harvested.  Hemostasis in the pocket was then achieved with bipolar and Bovie.  The abdominal wound was then closed with interrupted 3-0 Vicryl stitches, and a layer of Dermabond was applied.  The abdominal fat graft  was then cut to appropriate size, and placed into the sella.  This was then covered with a layer of fibrin sealant.  The nasal approach was then further closed and packed by Dr. Wilburn Cornelia.  Details again are dictated in a separate report.  At the end of the case all sponge, needle, instrument, and cottonoid counts were correct.  The patient was then transferred to the stretcher, extubated, and taken to the post-anesthesia care unit in stable hemodynamic condition.

## 2018-01-19 NOTE — Discharge Instructions (Signed)
Sinus/Nasal Instructions: 1. Limited activity 2. Liquid and soft diet 3. May bathe and shower 4. Saline nasal spray - 4 puffs/nostril every hour while awake 5. Elevate Head of Bed 6. No nose blowing/Open mouth sneeze

## 2018-01-19 NOTE — Consult Note (Signed)
ENT CONSULT:  Reason for Consult: Pituitary tumor Referring Physician: Dr. Beckie Salts is an 67 y.o. female.  HPI: Mariah [name] presents for endoscopic transsphenoidal pituitary resection.  She has a history of known pituitary mass found on previous scan.  [name] was referred to Dr. Kathyrn Sheriff for surgical evaluation.  Plan transsphenoidal resection.  CT and MRI scans are reviewed in detail.  Past Medical History:  Diagnosis Date  . Allergic rhinitis   . Arthritis    "ankles, feet, knees" (10/30/2017)  . Asthma   . Bursitis   . Carpal tunnel syndrome   . GERD (gastroesophageal reflux disease)   . HTN (hypertension)   . Migraine    "a couple/yr" (10/30/2017)  . Obesity   . Peripheral vascular disease ([place])   . Pneumonia 1960s X 1  . Sciatica   . Tendinitis   . Type II diabetes mellitus ([place])    Type II    Past Surgical History:  Procedure Laterality Date  . GANGLION CYST EXCISION Left   . KNEE ARTHROSCOPY Left 2003  . PLANTAR FASCIA RELEASE Right 1995   "heel"  . TONSILLECTOMY  1958  . UTERINE FIBROID SURGERY  1988; 1997   Dr [name]    Family History  Problem Relation Age of Onset  . Uterine cancer Mother   . Hypertension Mother   . Heart disease Mother   . Hypertension Father   . Alzheimer's disease Father   . Cancer Other     Social History:  reports that she has never smoked. She has never used smokeless tobacco. She reports that she does not drink alcohol or use drugs.  Allergies:  Allergies  Allergen Reactions  . Penicillins Hives and Other (See Comments)    Has Mariah [name] had a PCN reaction causing immediate rash, facial/tongue/throat swelling, SOB or lightheadedness with hypotension: No Has Mariah [name] had a PCN reaction causing severe rash involving mucus membranes or skin necrosis: No Mariah [name] HAS HAD A PCN REACTION THAT REQUIRED HOSPITALIZATION:  #  #  YES  #  #  Has Mariah [name] had a PCN reaction occurring within the last 10 years: No   . Codeine  Nausea And Vomiting  . Indomethacin Diarrhea    Medications: I have reviewed [name]'s current medications.  Results for orders placed or performed during the hospital encounter of 01/19/18 (from the past 48 hour(s))  Glucose, capillary     Status: Abnormal   Collection Time: 01/19/18  6:17 AM  Result Value Ref Range   Glucose-Capillary 127 (H) 70 - 99 mg/dL  CBC     Status: Abnormal   Collection Time: 01/19/18  6:32 AM  Result Value Ref Range   WBC 11.9 (H) 4.0 - 10.5 K/uL   RBC 3.90 3.87 - 5.11 MIL/uL   Hemoglobin 11.2 (L) 12.0 - 15.0 g/dL   HCT 36.2 36.0 - 46.0 %   MCV 92.8 78.0 - 100.0 fL   MCH 28.7 26.0 - 34.0 pg   MCHC 30.9 30.0 - 36.0 g/dL   RDW 14.9 11.5 - 15.5 %   Platelets 386 150 - 400 K/uL    Comment: Performed at [facility], [place] [address], [place], [place] [address]  Basic metabolic panel     Status: Abnormal   Collection Time: 01/19/18  6:32 AM  Result Value Ref Range   Sodium 138 135 - 145 mmol/L   Potassium 3.8 3.5 - 5.1 mmol/L   Chloride 103 98 - 111 mmol/L  Comment: Please note change in reference range.   CO2 27 22 - 32 mmol/L   Glucose, Bld 125 (H) 70 - 99 mg/dL    Comment: Please note change in reference range.   BUN 17 8 - 23 mg/dL    Comment: Please note change in reference range.   Creatinine, Ser 1.00 0.44 - 1.00 mg/dL   Calcium 8.8 (L) 8.9 - 10.3 mg/dL   GFR calc non Af Amer 58 (L) >60 mL/min   GFR calc Af Amer >60 >60 mL/min    Comment: (NOTE) The eGFR has been calculated using the CKD EPI equation. This calculation has not been validated in all clinical situations. eGFR's persistently <60 mL/min signify possible Chronic Kidney Disease.    Anion gap 8 5 - 15    Comment: Performed at [facility] [address], [place], [place] [address]  hCG, serum, qualitative     Status: None   Collection Time: 01/19/18  6:32 AM  Result Value Ref Range   Preg, Serum NEGATIVE NEGATIVE    Comment:        THE SENSITIVITY OF  THIS METHODOLOGY IS >10 mIU/mL. Performed at [facility], [place] [address], [place], [place] [address]     No results found.  ROS:ROS  Blood pressure (!) 175/87, pulse 96, temperature 98.6 F (37 C), temperature source Oral, resp. rate 18, height 5' 1"  (1.549 m), weight 105.2 kg (232 lb), last menstrual period 12/19/2017, SpO2 100 %.  PHYSICAL EXAM: General appearance - alert, well appearing, and in no distress Mental status - alert, oriented to person, place, and time Eyes - pupils equal and reactive, extraocular eye movements intact Nose - normal and patent, no erythema, discharge or polyps and no evidence of purulent discharge or infection  Studies Reviewed: CT and MRI scans of the head and sinuses  Assessment/Plan: Mariah [name] admitted for endoscopic transsphenoidal pituitary resection under general anesthesia.  Risks and benefits discussed in detail preoperatively with [name] and her family member.  They understand and agree with our surgical plan, questions answered.  [place], Jini Horiuchi 01/19/2018, 7:31 AM

## 2018-01-20 LAB — CBC
HCT: 28.9 % — ABNORMAL LOW (ref 36.0–46.0)
Hemoglobin: 8.9 g/dL — ABNORMAL LOW (ref 12.0–15.0)
MCH: 28.9 pg (ref 26.0–34.0)
MCHC: 30.8 g/dL (ref 30.0–36.0)
MCV: 93.8 fL (ref 78.0–100.0)
Platelets: 297 10*3/uL (ref 150–400)
RBC: 3.08 MIL/uL — ABNORMAL LOW (ref 3.87–5.11)
RDW: 15.3 % (ref 11.5–15.5)
WBC: 17 10*3/uL — ABNORMAL HIGH (ref 4.0–10.5)

## 2018-01-20 LAB — BASIC METABOLIC PANEL
Anion gap: 7 (ref 5–15)
BUN: 14 mg/dL (ref 8–23)
CO2: 25 mmol/L (ref 22–32)
Calcium: 7.6 mg/dL — ABNORMAL LOW (ref 8.9–10.3)
Chloride: 107 mmol/L (ref 98–111)
Creatinine, Ser: 0.99 mg/dL (ref 0.44–1.00)
GFR calc Af Amer: >60 mL/min (ref >60)
GFR calc non Af Amer: 59 mL/min — ABNORMAL LOW (ref >60)
Glucose, Bld: 155 mg/dL — ABNORMAL HIGH (ref 70–99)
Potassium: 3.7 mmol/L (ref 3.5–5.1)
Sodium: 139 mmol/L (ref 135–145)

## 2018-01-20 LAB — GLUCOSE, CAPILLARY
GLUCOSE-CAPILLARY: 137 mg/dL — AB (ref 70–99)
GLUCOSE-CAPILLARY: 58 mg/dL — AB (ref 70–99)
Glucose-Capillary: 160 mg/dL — ABNORMAL HIGH (ref 70–99)
Glucose-Capillary: 161 mg/dL — ABNORMAL HIGH (ref 70–99)
Glucose-Capillary: 117 mg/dL — ABNORMAL HIGH (ref 70–99)
Glucose-Capillary: 55 mg/dL — ABNORMAL LOW (ref 70–99)
Glucose-Capillary: 50 mg/dL — ABNORMAL LOW (ref 70–99)
Glucose-Capillary: 51 mg/dL — ABNORMAL LOW (ref 70–99)

## 2018-01-20 LAB — CORTISOL-AM, BLOOD: CORTISOL - AM: 13.1 ug/dL (ref 6.7–22.6)

## 2018-01-20 MED ORDER — FUROSEMIDE 40 MG PO TABS
40.0000 mg | ORAL_TABLET | Freq: Every day | ORAL | Status: DC
  Administered 2018-01-21 – 2018-01-23 (×3): 40 mg via ORAL
  Filled 2018-01-20 (×4): qty 1

## 2018-01-20 MED ORDER — ALBUTEROL SULFATE (2.5 MG/3ML) 0.083% IN NEBU: 3.0000 mL | INHALATION_SOLUTION | Freq: Four times a day (QID) | RESPIRATORY_TRACT | Status: DC | PRN

## 2018-01-20 MED ORDER — SITAGLIPTIN PHOS-METFORMIN HCL 50-1000 MG PO TABS: 1.0000 | ORAL_TABLET | Freq: Two times a day (BID) | ORAL | Status: DC

## 2018-01-20 MED ORDER — DEXTROSE 50 % IV SOLN
INTRAVENOUS | Status: AC
  Administered 2018-01-20: 50 mL via INTRAVENOUS
  Filled 2018-01-20: qty 50

## 2018-01-20 MED ORDER — LINAGLIPTIN 5 MG PO TABS
5.0000 mg | ORAL_TABLET | Freq: Every day | ORAL | Status: DC
  Administered 2018-01-20 – 2018-01-22 (×2): 5 mg via ORAL
  Filled 2018-01-20 (×4): qty 1

## 2018-01-20 MED ORDER — AMLODIPINE BESYLATE 10 MG PO TABS
10.0000 mg | ORAL_TABLET | Freq: Every day | ORAL | Status: DC
  Administered 2018-01-21 – 2018-01-23 (×3): 10 mg via ORAL
  Filled 2018-01-20 (×3): qty 1

## 2018-01-20 MED ORDER — NAPROXEN 250 MG PO TABS
500.0000 mg | ORAL_TABLET | Freq: Two times a day (BID) | ORAL | Status: DC
  Administered 2018-01-20 – 2018-01-23 (×6): 500 mg via ORAL
  Filled 2018-01-20 (×7): qty 2

## 2018-01-20 MED ORDER — DEXTROSE 50 % IV SOLN
25.0000 g | Freq: Once | INTRAVENOUS | Status: AC
  Administered 2018-01-20: 50 mL via INTRAVENOUS

## 2018-01-20 MED ORDER — METFORMIN HCL 500 MG PO TABS
1000.0000 mg | ORAL_TABLET | Freq: Two times a day (BID) | ORAL | Status: DC
  Administered 2018-01-20 – 2018-01-22 (×3): 1000 mg via ORAL
  Filled 2018-01-20 (×4): qty 2

## 2018-01-20 MED ORDER — LISINOPRIL 20 MG PO TABS
40.0000 mg | ORAL_TABLET | Freq: Every day | ORAL | Status: DC
  Administered 2018-01-20 – 2018-01-23 (×4): 40 mg via ORAL
  Filled 2018-01-20 (×4): qty 2

## 2018-01-20 MED ORDER — PANTOPRAZOLE SODIUM 40 MG PO TBEC
40.0000 mg | DELAYED_RELEASE_TABLET | Freq: Every day | ORAL | Status: DC
  Administered 2018-01-20 – 2018-01-23 (×4): 40 mg via ORAL
  Filled 2018-01-20 (×4): qty 1

## 2018-01-20 MED ORDER — MOMETASONE FURO-FORMOTEROL FUM 100-5 MCG/ACT IN AERO
2.0000 | INHALATION_SPRAY | Freq: Two times a day (BID) | RESPIRATORY_TRACT | Status: DC
  Administered 2018-01-20 – 2018-01-23 (×4): 2 via RESPIRATORY_TRACT
  Filled 2018-01-20 (×2): qty 8.8

## 2018-01-20 MED ORDER — GLIPIZIDE ER 10 MG PO TB24
20.0000 mg | ORAL_TABLET | Freq: Every day | ORAL | Status: DC
  Administered 2018-01-20 – 2018-01-22 (×2): 20 mg via ORAL
  Filled 2018-01-20 (×4): qty 2

## 2018-01-20 NOTE — Progress Notes (Signed)
Patient rested comfortably throughout the night without any complications from surgery.

## 2018-01-20 NOTE — Evaluation (Signed)
Physical Therapy Evaluation Patient Details Name: Mariah [name] MRN: [id number] DOB: 1951-05-05 Today's Date: 01/20/2018   History of Present Illness  Pt is a 67 y.o. female with PMH significnat for HTN, GERD, diabetes mellitus type 2, morbid obesity, chonic venous stasis dermatitis, and asthma who presented to the with known pituitary adenoma and being followed by neurosurgery now s/p Endoscopic transsphenoidal pituitary resection  Clinical Impression  Orders received for PT evaluation. Patient demonstrates deficits in functional mobility as indicated below. Will benefit from continued skilled PT to address deficits and maximize function. Will see as indicated and progress as tolerated.  OF NOTE: VSS throughout session Patient currently requires increased physical assist for basic transfers and was unable to perform ambulation at this time due to BLE weakness. Feel patient will need post acute rehabilitation. Recommend SNF as patient does not have support at home.    Follow Up Recommendations Supervision for mobility/OOB    Equipment Recommendations  (tbd)    Recommendations for Other Services       Precautions / Restrictions Precautions Precautions: Fall      Mobility  Bed Mobility Overal bed mobility: Needs Assistance Bed Mobility: Supine to Sit     Supine to sit: Min assist     General bed mobility comments: min assist for LE movement to EOB. Increased time and effort to perform.  Transfers Overall transfer level: Needs assistance Equipment used: 1 person hand held assist Transfers: Sit to/from [facility] Sit to Stand: Mod assist Stand pivot transfers: Mod assist       General transfer comment: Increased physical assist to elevate to standing at EOB, increased time to power up from elevated seat height. Limited ability to reach terminal upright position. Continued increased assist during pivot to chair with flexed forward  posture.  Ambulation/Gait             General Gait Details: unable to ambulate at this time  Stairs            Wheelchair Mobility    Modified Rankin (Stroke Patients Only)       Balance Overall balance assessment: Needs assistance Sitting-balance support: Feet supported Sitting balance-Leahy Scale: Fair Sitting balance - Comments: able to sit at EOB   Standing balance support: During functional activity Standing balance-Leahy Scale: Poor Standing balance comment: reliance on physical assist for standing balance in static position                             Pertinent Vitals/Pain Pain Assessment: Faces Faces Pain Scale: Hurts even more Pain Location: right flank/abdomen incision Pain Descriptors / Indicators: Sore Pain Intervention(s): Monitored during session    Home Living Family/patient expects to be discharged to:: Private residence Living Arrangements: Alone Available Help at Discharge: Neighbor;Available PRN/intermittently Type of Home: Apartment Home Access: Level entry     Home Layout: One level Home Equipment: Cane - single point;Crutches;Wheelchair - manual      Prior Function Level of Independence: Independent         Comments: reports ocassional use of cane and crutch     Hand Dominance   Dominant Hand: Right    Extremity/Trunk Assessment   Upper Extremity Assessment Upper Extremity Assessment: Generalized weakness    Lower Extremity Assessment Lower Extremity Assessment: Generalized weakness(BLE weakness 2+/5)       Communication   Communication: No difficulties  Cognition Arousal/Alertness: Awake/alert Behavior During Therapy: WFL for tasks assessed/performed Overall Cognitive  Status: No family/caregiver present to determine baseline cognitive functioning                                        General Comments      Exercises     Assessment/Plan    PT Assessment Patient needs  continued PT services  PT Problem List Decreased strength;Decreased range of motion;Decreased activity tolerance;Decreased balance;Decreased mobility;Decreased safety awareness;Obesity;Pain       PT Treatment Interventions DME instruction;Gait training;Functional mobility training;Therapeutic activities;Therapeutic exercise;Balance training;Neuromuscular re-education;Cognitive remediation;Patient/family education;Wheelchair mobility training    PT Goals (Current goals can be found in the Care Plan section)  Acute Rehab PT Goals Patient Stated Goal: to walk better PT Goal Formulation: With patient Time For Goal Achievement: 02/03/18 Potential to Achieve Goals: Good    Frequency Min 3X/week   Barriers to discharge Decreased caregiver support      Co-evaluation               AM-PAC PT "6 Clicks" Daily Activity  Outcome Measure Difficulty turning over in bed (including adjusting bedclothes, sheets and blankets)?: A Little Difficulty moving from lying on back to sitting on the side of the bed? : Unable Difficulty sitting down on and standing up from a chair with arms (e.g., wheelchair, bedside commode, etc,.)?: Unable Help needed moving to and from a bed to chair (including a wheelchair)?: A Lot Help needed walking in hospital room?: A Lot Help needed climbing 3-5 steps with a railing? : Total 6 Click Score: 10    End of Session Equipment Utilized During Treatment: Gait belt Activity Tolerance: Patient limited by fatigue Patient left: in chair;with call bell/phone within reach;with chair alarm set Nurse Communication: Mobility status PT Visit Diagnosis: Other symptoms and signs involving the nervous system (R29.898);Muscle weakness (generalized) (M62.81)    Time: 4628-6381 PT Time Calculation (min) (ACUTE ONLY): 24 min   Charges:   PT Evaluation $PT Eval Moderate Complexity: 1 Mod     PT G Codes:        Alben Deeds, PT DPT  Board Certified Neurologic  Specialist [place] 01/20/2018, 10:07 AM

## 2018-01-20 NOTE — Progress Notes (Signed)
Postop day 1.  Patient looks good this morning.  No complaints of headaches.  Patient states that her vision is better.  No evidence of CSF drainage.  No evidence of urinary output consistent with diabetes insipidus.  Sodium levels stable.  Awake and alert.  Oriented and appropriate.  Cranial nerve function intact aside from some visual loss which is improved.  Nose dry.  Doing well following transsphenoidal pituitary tumor resection.  Transfer to stepdown.  Continue observation.  Begin to mobilize.

## 2018-01-20 NOTE — Progress Notes (Signed)
Patient transferring to 4NP05. Report called to the receiving nurse.

## 2018-01-21 LAB — GLUCOSE, CAPILLARY
GLUCOSE-CAPILLARY: 43 mg/dL — AB (ref 70–99)
GLUCOSE-CAPILLARY: 65 mg/dL — AB (ref 70–99)
Glucose-Capillary: 74 mg/dL (ref 70–99)
Glucose-Capillary: 82 mg/dL (ref 70–99)
Glucose-Capillary: 78 mg/dL (ref 70–99)

## 2018-01-21 NOTE — Progress Notes (Signed)
Rechecked blood sugar is 74 Pt eating breakfast

## 2018-01-21 NOTE — Progress Notes (Signed)
Inpatient Diabetes Program Recommendations  AACE/ADA: New Consensus Statement on Inpatient Glycemic Control (2015)  Target Ranges:  Prepandial:   less than 140 mg/dL      Peak postprandial:   less than 180 mg/dL (1-2 hours)      Critically ill patients:  140 - 180 mg/dL   Lab Results  Component Value Date   GLUCAP 82 01/21/2018   HGBA1C 6.6 (H) 12/19/2017    Review of Glycemic Control  Diabetes history: DM2 Outpatient Diabetes medications: glipizide 20 mg QAM, Janumet 50/1000 mg bid Current orders for Inpatient glycemic control: glipizide 20 mg QAM, metformin 1000 mg bid, tradjenta 5 mg QD, Novolog 0-15 units tidwc  HgbA1C - 6.6% - good control. D/C OHAs while inpatient to avoid hypoglycemia.  Inpatient Diabetes Program Recommendations:     D/C glipizide, tradjenta and metformin. Continue Novolog 0-15 units tidwc.  Will follow.  Thank you. Lorenda Peck, RD, LDN, CDE Inpatient Diabetes Coordinator [phone]

## 2018-01-21 NOTE — Progress Notes (Signed)
2248: Handoff report received from ICU RN. Pt transferred to 4N05 bed and oriented to unit. Discussed plan of care for the shift; pt amenable to plan.  0000: Pt resting comfortably.  0400: Pt continues resting comfortably.  0700: Handoff report given to RN. No acute events overnight.

## 2018-01-21 NOTE — Progress Notes (Signed)
Pt blood sugar is 43 Given orang juice Held a.m. Diabetes medication Put in consult for diabetic counselor Will recheck

## 2018-01-21 NOTE — Progress Notes (Signed)
Overall doing well.  Minimal pain.  Vision remains improved.  Afebrile.  Vital signs are stable.  Cortisol level normal.  No new cranial nerve deficits.  No evidence of CSF leakage.  Continue observation.  Probable discharge home tomorrow.

## 2018-01-22 ENCOUNTER — Encounter (HOSPITAL_COMMUNITY): Payer: Self-pay | Admitting: Neurosurgery

## 2018-01-22 LAB — BASIC METABOLIC PANEL
ANION GAP: 10 (ref 5–15)
BUN: 8 mg/dL (ref 8–23)
CHLORIDE: 103 mmol/L (ref 98–111)
CO2: 28 mmol/L (ref 22–32)
Calcium: 8.5 mg/dL — ABNORMAL LOW (ref 8.9–10.3)
Creatinine, Ser: 0.9 mg/dL (ref 0.44–1.00)
GFR calc non Af Amer: >60 mL/min (ref >60)
GLUCOSE: 120 mg/dL — AB (ref 70–99)
POTASSIUM: 3.7 mmol/L (ref 3.5–5.1)
Sodium: 141 mmol/L (ref 135–145)

## 2018-01-22 LAB — GLUCOSE, CAPILLARY
GLUCOSE-CAPILLARY: 71 mg/dL (ref 70–99)
GLUCOSE-CAPILLARY: 77 mg/dL (ref 70–99)
Glucose-Capillary: 114 mg/dL — ABNORMAL HIGH (ref 70–99)
Glucose-Capillary: 85 mg/dL (ref 70–99)

## 2018-01-22 MED ORDER — LEVETIRACETAM 500 MG PO TABS
500.0000 mg | ORAL_TABLET | Freq: Two times a day (BID) | ORAL | Status: DC
  Administered 2018-01-22 – 2018-01-23 (×2): 500 mg via ORAL
  Filled 2018-01-22 (×2): qty 1

## 2018-01-22 NOTE — Evaluation (Signed)
Occupational Therapy Evaluation Patient Details Name: Mariah [name] MRN: [id number] DOB: Dec 18, 1950 Today's Date: 01/22/2018    History of Present Illness Pt is a 67 y.o. female with PMH significnat for HTN, GERD, diabetes mellitus type 2, morbid obesity, chonic venous stasis dermatitis, and asthma who presented to the with known pituitary adenoma and being followed by neurosurgery now s/p Endoscopic transsphenoidal pituitary resection   Clinical Impression   PTA, pt reports that she was living alone and utilized cane or crutch occasionally for functional mobility but was otherwise independent. She presented to OT evaluation with decreased attention, awareness, and problem solving skills as well as decreased activity tolerance and generalized weakness impacting her ability to participate in ADL at [facility]. Pt currently requiring total assistance for LB ADL, min assist for UB ADL, and mod assist for stand-pivot toilet transfers. Pt would benefit from continued OT services while admitted to improve independence with ADL and functional mobility. Pt reports that a neighbor will assist her at home but unsure if will be able to have the necessary assistance for ADL participation. Thus, recommend short-term SNF level rehabilitation post-acute D/C.     Follow Up Recommendations  SNF;Supervision/Assistance - 24 hour    Equipment Recommendations  3 in 1 bedside commode    Recommendations for Other Services       Precautions / Restrictions Precautions Precautions: Fall Restrictions Weight Bearing Restrictions: No      Mobility Bed Mobility Overal bed mobility: Needs Assistance Bed Mobility: Supine to Sit     Supine to sit: Min guard     General bed mobility comments: Min guard assist for safety.  Transfers Overall transfer level: Needs assistance Equipment used: 1 person hand held assist Transfers: Sit to/from [facility] Sit to Stand: Mod assist Stand pivot transfers:  Mod assist       General transfer comment: Requiring physical assistance to power up to completely upright standing position.     Balance Overall balance assessment: Needs assistance Sitting-balance support: Feet supported Sitting balance-Leahy Scale: Fair Sitting balance - Comments: able to sit at EOB   Standing balance support: During functional activity Standing balance-Leahy Scale: Poor Standing balance comment: Relies on B UE support in standing.                            ADL either performed or assessed with clinical judgement   ADL Overall ADL's : Needs assistance/impaired Eating/Feeding: Set up;Sitting   Grooming: Supervision/safety;Sitting   Upper Body Bathing: Sitting;Minimal assistance   Lower Body Bathing: Total assistance;Sit to/from stand   Upper Body Dressing : Minimal assistance;Sitting   Lower Body Dressing: Total assistance;Sit to/from stand   Toilet Transfer: Moderate assistance;Stand-pivot;RW;BSC   Toileting- Clothing Manipulation and Hygiene: Total assistance;Sit to/from stand       Functional mobility during ADLs: Moderate assistance;Rolling walker General ADL Comments: Pt with limited activity tolerance for ADL participation.      Vision Patient Visual Report: No change from baseline Vision Assessment?: No apparent visual deficits     Perception     Praxis      Pertinent Vitals/Pain Pain Assessment: Faces Faces Pain Scale: Hurts even more Pain Location: right flank/abdomen incision Pain Descriptors / Indicators: Sore Pain Intervention(s): Monitored during session     Hand Dominance Right   Extremity/Trunk Assessment Upper Extremity Assessment Upper Extremity Assessment: Generalized weakness   Lower Extremity Assessment Lower Extremity Assessment: Defer to PT evaluation  Communication Communication Communication: No difficulties   Cognition Arousal/Alertness: Awake/alert Behavior During Therapy: WFL for  tasks assessed/performed Overall Cognitive Status: History of cognitive impairments - at baseline Area of Impairment: Attention;Memory;Following commands;Safety/judgement;Awareness;Problem solving                   Current Attention Level: Selective Memory: Decreased short-term memory Following Commands: Follows one step commands consistently;Follows multi-step commands inconsistently Safety/Judgement: Decreased awareness of safety;Decreased awareness of deficits Awareness: Emergent Problem Solving: Slow processing;Difficulty sequencing;Decreased initiation General Comments: Pt with notable poor awareness and problem solving skills.    General Comments       Exercises     Shoulder Instructions      Home Living Family/patient expects to be discharged to:: Private residence Living Arrangements: Alone Available Help at Discharge: Neighbor;Available PRN/intermittently Type of Home: Apartment Home Access: Level entry     Home Layout: One level     Bathroom Shower/Tub: Teacher, early years/pre: Standard     Home Equipment: Cane - single point;Crutches;Wheelchair - manual          Prior Functioning/Environment Level of Independence: Independent        Comments: reports ocassional use of cane and crutch        OT Problem List: Decreased strength;Decreased range of motion;Decreased activity tolerance;Impaired balance (sitting and/or standing);Decreased safety awareness;Decreased knowledge of use of DME or AE;Decreased knowledge of precautions;Pain      OT Treatment/Interventions: Self-care/ADL training;Therapeutic exercise;Energy conservation;DME and/or AE instruction;Therapeutic activities;Patient/family education;Balance training;Cognitive remediation/compensation    OT Goals(Current goals can be found in the care plan section) Acute Rehab OT Goals Patient Stated Goal: to walk better OT Goal Formulation: With patient Time For Goal Achievement:  02/05/18 Potential to Achieve Goals: Good ADL Goals Pt Will Perform Grooming: with min assist;standing Pt Will Perform Upper Body Bathing: with set-up;sitting Pt Will Perform Lower Body Dressing: with min assist;sit to/from stand Pt Will Transfer to Toilet: with min assist;ambulating;regular height toilet;bedside commode(BSC over toilet) Pt Will Perform Toileting - Clothing Manipulation and hygiene: with min assist;sit to/from stand  OT Frequency: Min 2X/week   Barriers to D/C:            Co-evaluation              AM-PAC PT "6 Clicks" Daily Activity     Outcome Measure Help from another person eating meals?: None Help from another person taking care of personal grooming?: A Little Help from another person toileting, which includes using toliet, bedpan, or urinal?: A Lot Help from another person bathing (including washing, rinsing, drying)?: A Lot Help from another person to put on and taking off regular upper body clothing?: A Little Help from another person to put on and taking off regular lower body clothing?: A Lot 6 Click Score: 16   End of Session Equipment Utilized During Treatment: Gait belt;Rolling walker Nurse Communication: Mobility status  Activity Tolerance: Patient tolerated treatment well Patient left: in chair;with call bell/phone within reach(no chair alarm present on unit and notified secretary)  OT Visit Diagnosis: Other abnormalities of gait and mobility (R26.89);Muscle weakness (generalized) (M62.81)                Time: 4270-6237 OT Time Calculation (min): 45 min Charges:  OT General Charges $OT Visit: 1 Visit OT Evaluation $OT Eval Moderate Complexity: 1 Mod OT Treatments $Self Care/Home Management : 23-37 mins G-Codes:     Norman Herrlich, MS OTR/L  Pager: [place]  Mariah [name] 01/22/2018, 1:09 PM

## 2018-01-22 NOTE — Progress Notes (Signed)
Subjective:  The patient is alert and pleasant. She complains that she has been urinating quite a bit. She complains about an interaction she had with the respiratory therapist yesterday. I encouraged her to take that up with the hospital administration.  Objective: Vital signs in last 24 hours: Temp:  [98.2 F (36.8 C)-98.7 F (37.1 C)] 98.4 F (36.9 C) (07/01 1100) Pulse Rate:  [98-108] 106 (07/01 0305) Resp:  [21-28] 28 (07/01 0305) BP: (140-156)/(62-75) 140/75 (07/01 0948) SpO2:  [93 %-97 %] 95 % (07/01 0305) Estimated body mass index is 43.84 kg/m as calculated from the following:   Height as of this encounter: 5\' 1"  (1.549 m).   Weight as of this encounter: 105.2 kg (232 lb).   Intake/Output from previous day: 06/30 0701 - 07/01 0700 In: 2170.5 [P.O.:540; I.V.:1534; IV Piggyback:96.5] Out: 4800 [Urine:4800] Intake/Output this shift: Total I/O In: 240 [P.O.:240] Out: 1525 [Urine:1525]  Physical exam the patient is alert and oriented. She is moving all 4 extremities well.  Lab Results: Recent Labs    01/20/18 0604  WBC 17.0*  HGB 8.9*  HCT 28.9*  PLT 297   BMET Recent Labs    01/20/18 0604  NA 139  K 3.7  CL 107  CO2 25  GLUCOSE 155*  BUN 14  CREATININE 0.99  CALCIUM 7.6*    Studies/Results: No results found.  Assessment/Plan: Postop day #3: The patient had quite a bit of urine output yesterday and is over 4 L negative over the past 2 days. This could be a natural diuresis after surgery or possibly diabetes insipidus. I'll check her sodium and we will continue to monitor her urine output. She is alert and able to drink ad lib. so I'll discontinue her IV fluids.  LOS: 3 days     Ophelia Charter 01/22/2018, 11:43 AM

## 2018-01-22 NOTE — Care Management Important Message (Signed)
Important Message  Patient Details  Name: Mariah [name] MRN: [id number] Date of Birth: 03/03/1951   Medicare Important Message Given:  Yes    Orbie Pyo 01/22/2018, 4:55 PM

## 2018-01-22 NOTE — Progress Notes (Signed)
PHARMACIST - PHYSICIAN COMMUNICATION  DR:  Kathyrn Sheriff  CONCERNING: IV to Oral Route Change Policy  RECOMMENDATION: This patient is receiving Keppra by the intravenous route.  Based on criteria approved by the Pharmacy and Therapeutics Committee, the intravenous medication(s) is/are being converted to the equivalent oral dose form(s).   DESCRIPTION: These criteria include:  The patient is eating (either orally or via tube) and/or has been taking other orally administered medications for a least 24 hours  The patient has no evidence of active gastrointestinal bleeding or impaired GI absorption (gastrectomy, short bowel, patient on TNA or NPO).  If you have questions about this conversion, please contact the Pharmacy Department  []   [phone] )  [facility] []   [phone] )  [facility] [x]   [phone] )  [facility] []   [phone] )  [facility] []   [phone] )  [place], [facility] 01/22/2018 9:31 AM

## 2018-01-23 LAB — GLUCOSE, CAPILLARY
GLUCOSE-CAPILLARY: 80 mg/dL (ref 70–99)
GLUCOSE-CAPILLARY: 122 mg/dL — AB (ref 70–99)

## 2018-01-23 LAB — BASIC METABOLIC PANEL
Anion gap: 5 (ref 5–15)
BUN: 12 mg/dL (ref 8–23)
CHLORIDE: 103 mmol/L (ref 98–111)
CO2: 28 mmol/L (ref 22–32)
CREATININE: 1.01 mg/dL — AB (ref 0.44–1.00)
Calcium: 8 mg/dL — ABNORMAL LOW (ref 8.9–10.3)
GFR calc Af Amer: >60 mL/min (ref >60)
GFR, EST NON AFRICAN AMERICAN: 57 mL/min — AB (ref >60)
GLUCOSE: 117 mg/dL — AB (ref 70–99)
POTASSIUM: 3.4 mmol/L — AB (ref 3.5–5.1)
SODIUM: 136 mmol/L (ref 135–145)

## 2018-01-23 MED ORDER — HYDROCODONE-ACETAMINOPHEN 5-325 MG PO TABS: 1.0000 | ORAL_TABLET | ORAL | 0 refills | Status: DC | PRN

## 2018-01-23 MED ORDER — LEVETIRACETAM 500 MG PO TABS: 500.0000 mg | ORAL_TABLET | Freq: Two times a day (BID) | ORAL | 0 refills | Status: DC

## 2018-01-23 NOTE — Care Management Note (Signed)
Case Management Note  Patient Details  Name: Mariah [name] MRN: [id number] Date of Birth: 11-30-1950  Subjective/Objective:   Pt admitted on 01/19/18 s/p transsphenoidal pituitary resection for pituitary adenoma.  PTA, pt independent, lives at home alone.                  Action/Plan: PT/OT recommending SNF, though pt prefers to go home with [facility] services.  Pt states she has assistance from neighbors at discharge.  She is agreeable to [facility] follow up at discharge, and prefers [facility], as she has used them in the past.  Referral to [facility], per pt choice; start of care 24-48h post dc date.  RW obtained for pt for home use, per her request.   Expected Discharge Date:  01/23/18               Expected Discharge Plan:  [place]  In-House Referral:     Discharge planning Services  CM Consult  Post Acute Care Choice:  Home Health Choice offered to:     DME Arranged:  [place] [facility] DME Agency:  [place] Arranged:  PT, OT [facility] Agency:  [place]  Status of Service:  Completed, signed off  If discussed at [place] of Stay Meetings, dates discussed:    Additional Comments:  Reinaldo Raddle, RN, BSN  Trauma/Neuro ICU Case Manager [phone]

## 2018-01-23 NOTE — Discharge Summary (Signed)
Physician Discharge Summary  Patient ID: Mariah [name] MRN: [id number] DOB/AGE: May 07, 1951 67 y.o.  Admit date: 01/19/2018 Discharge date: 01/23/2018  Admission Diagnoses:  Pituitary tumor  Discharge Diagnoses:  Same Principal Problem:   Pituitary tumor  Discharged Condition: Stable  Hospital Course:  Mariah [name] is a 67 y.o. female who was admitted for the below procedure. There were no post operative complications. At time of discharge, pain was well controlled, ambulating with Pt/OT, tolerating po, voiding normal. Ready for discharge.  Treatments: Surgery 1. Endoscopic transsphenoidal resection of pituitary tumor 2. Harvest of abdominal fat graft  Discharge Exam: Blood pressure (!) 154/78, pulse 94, temperature 98.2 F (36.8 C), temperature source Oral, resp. rate (!) 21, height 5\' 1"  (1.549 m), weight 105.2 kg (232 lb), last menstrual period 12/19/2017, SpO2 99 %. Awake, alert, oriented Speech fluent, appropriate CN grossly intact 5/5 BUE/BLE Wound c/d/i  Disposition: Discharge disposition: 01-Home or Self Care       Discharge Instructions    Call MD for:  difficulty breathing, headache or visual disturbances   Complete by:  As directed    Call MD for:  persistant dizziness or light-headedness   Complete by:  As directed    Call MD for:  redness, tenderness, or signs of infection (pain, swelling, redness, odor or green/yellow discharge around incision site)   Complete by:  As directed    Call MD for:  severe uncontrolled pain   Complete by:  As directed    Call MD for:  temperature >100.4   Complete by:  As directed    Diet general   Complete by:  As directed    Driving Restrictions   Complete by:  As directed    Do not drive until given clearance.   Increase activity slowly   Complete by:  As directed    Lifting restrictions   Complete by:  As directed    Do not lift anything >10lbs. Avoid bending and twisting in awkward positions. Avoid bending at  the back.   May shower / Bathe   Complete by:  As directed    In 24 hours. Okay to wash wound with warm soapy water. Avoid scrubbing the wound. Pat dry.   Remove dressing in 24 hours   Complete by:  As directed      Allergies as of 01/23/2018      Reactions   Penicillins Hives, Other (See Comments)   Has patient had a PCN reaction causing immediate rash, facial/tongue/throat swelling, SOB or lightheadedness with hypotension: No Has patient had a PCN reaction causing severe rash involving mucus membranes or skin necrosis: No PATIENT HAS HAD A PCN REACTION THAT REQUIRED HOSPITALIZATION:  #  #  YES  #  #  Has patient had a PCN reaction occurring within the last 10 years: No   Codeine Nausea And Vomiting   Indomethacin Diarrhea      Medication List    TAKE these medications   albuterol 108 (90 Base) MCG/ACT inhaler Commonly known as:  PROVENTIL HFA;VENTOLIN HFA Inhale 2 puffs into the lungs every 6 (six) hours as needed for wheezing or shortness of breath.   amLODipine 10 MG tablet Commonly known as:  NORVASC TAKE ONE TABLET BY MOUTH EVERY DAY   AQUALANCE LANCETS 30G Misc USE AS DIRECTED TWICE DAILY   esomeprazole 20 MG capsule Commonly known as:  NEXIUM Take 1 capsule (20 mg total) by mouth every morning. For acid reflex   Fluticasone-Salmeterol 100-50 MCG/DOSE Aepb  Commonly known as:  ADVAIR DISKUS Inhale 1 puff into the lungs 2 (two) times daily.   furosemide 40 MG tablet Commonly known as:  LASIX Take 1 tablet (40 mg total) by mouth daily.   glipiZIDE 10 MG 24 hr tablet Commonly known as:  GLUCOTROL XL Take 2 tablets (20 mg total) by mouth daily.   HYDROcodone-acetaminophen 5-325 MG tablet Commonly known as:  NORCO/VICODIN Take 1 tablet by mouth every 4 (four) hours as needed for moderate pain.   levETIRAcetam 500 MG tablet Commonly known as:  KEPPRA Take 1 tablet (500 mg total) by mouth 2 (two) times daily.   levofloxacin 500 MG tablet Commonly known as:   LEVAQUIN Take 1 tablet (500 mg total) by mouth daily. 1 tab po qd   lisinopril 40 MG tablet Commonly known as:  PRINIVIL,ZESTRIL Take 40 mg by mouth daily.   losartan 100 MG tablet Commonly known as:  COZAAR Take 1 tablet (100 mg total) by mouth daily.   mometasone 50 MCG/ACT nasal spray Commonly known as:  NASONEX Place 1 spray into the nose as needed. What changed:  reasons to take this   naproxen 500 MG tablet Commonly known as:  NAPROSYN Take 1 tablet (500 mg total) by mouth 2 (two) times daily with a meal. What changed:    when to take this  reasons to take this   sitaGLIPtin-metformin 50-1000 MG tablet Commonly known as:  JANUMET Take 1 tablet by mouth 2 (two) times daily with a meal.   TRUE METRIX BLOOD GLUCOSE TEST test strip Generic drug:  glucose blood USE AS DIRECTED TWICE DAILY      Follow-up Information    Lisbeth Renshaw, MD. Schedule an appointment as soon as possible for a visit in 3 week(s).   Specialty:  Neurosurgery Contact information: [address] [place] [address] [phone]           Signed: Alyson Ingles 01/23/2018, 11:07 AM

## 2018-01-23 NOTE — Progress Notes (Signed)
  NEUROSURGERY PROGRESS NOTE   No issues overnight. Denies any nasal drainage. Vision is unchanged, subjectively better than preop.  EXAM:  BP (!) 154/78   Pulse 94   Temp 98.2 F (36.8 C) (Oral)   Resp (!) 21   Ht 5\' 1"  (1.549 m)   Wt 105.2 kg (232 lb)   LMP 12/19/2017 Comment: spotting & clotting; Gyn F/U indicated  SpO2 99%   BMI 43.84 kg/m   Awake, alert, oriented  Speech fluent, appropriate  CN grossly intact  5/5 BUE/BLE   IMPRESSION:  67 y.o. female POD# 4 s/p transsphenoidal resection of tumor, doing well. No sign of CSF rhinorrhea, no evidence of DI.  PLAN: - d/c home today

## 2018-01-23 NOTE — Clinical Social Work Note (Signed)
Clinical Social Work Assessment  Patient Details  Name: Mariah [name] MRN: [id number] Date of Birth: 1951-01-22  Date of referral:  01/23/18               Reason for consult:  Facility Placement, Transportation, Discharge Planning                Permission sought to share information with:   No Permission granted to share information::  No  Housing/Transportation Living arrangements for the past 2 months:  Apartment Source of Information:  Patient Patient Interpreter Needed:  None Criminal Activity/Legal Involvement Pertinent to Current Situation/Hospitalization:  No - Comment as needed Significant Relationships:  [employer], [place], Friend Lives with:  Self Do you feel safe going back to the place where you live?  Yes Need for family participation in patient care:  Yes (Comment)  Care giving concerns: Pt lives alone in an apartment, has chronic issues with her legs, requiring assistance with ADLs and IADLS.   Social Worker assessment / plan:  CSW met with pt at bedside, pt was up and starting to get ready to discharge. Pt states that she has chronic issues with her legs, has had assistance from neighbors and friends for the last few years. Has support to get around her apartment and with any thing she needs. Pt states that she feels she has good support. Has no needs with transportation as she gets rides and support from her friends with activities like groceries and other errands, as well as transportation to her doctors appointments.   Pt states she feels safe going home, didn't express any further social work needs. Signing off.   Employment status:  Retired Forensic scientist:  [employer] PT Recommendations:  [address] / Referral to community resources:  [place]  Patient/Family's Response to care:  [place] and accepting of CSW visit. All questions answered.   Patient/Family's Understanding of and Emotional Response to Diagnosis,  Current Treatment, and Prognosis:  Pt states understanding of her diagnosis, current treatment and prognosis. Pt states good understanding of her strengths and limitations. States good support at home and has a positive attitude about discharging and moving forward.   Emotional Assessment Appearance:  Appears stated age Attitude/Demeanor/Rapport:  Gracious, Engaged Affect (typically observed):  Adaptable, Appropriate, Pleasant Orientation:  Oriented to Self, Oriented to Place, Oriented to  Time, Oriented to Situation Alcohol / Substance use:  Not Applicable Psych involvement (Current and /or in the community):  No (Comment)  Discharge Needs  Concerns to be addressed:  Discharge Planning Concerns, Care Coordination Readmission within the last 30 days:  No Current discharge risk:  Lives alone, Physical Impairment Barriers to Discharge:  Continued Medical Work up   [employer], [place] 01/23/2018, 12:28 PM

## 2018-01-23 NOTE — Progress Notes (Addendum)
Physical Therapy Treatment Patient Details Name: Mariah [name] MRN: [id number] DOB: 04-28-1951 Today's Date: 01/23/2018    History of Present Illness Pt is a 67 y.o. female with PMH significnat for HTN, GERD, diabetes mellitus type 2, morbid obesity, chonic venous stasis dermatitis, and asthma who presented to the with known pituitary adenoma and being followed by neurosurgery now s/p Endoscopic transsphenoidal pituitary resection    PT Comments    Patient seen foe mobility management and education prior to discharge home. Patient mobility levels remain concerning but patient insistent of return home. Will need HHPT upon discharge.    Follow Up Recommendations  Supervision for mobility/OOB;Home health PT     Equipment Recommendations  (tbd)    Recommendations for Other Services       Precautions / Restrictions Precautions Precautions: Fall Restrictions Weight Bearing Restrictions: No    Mobility  Bed Mobility               General bed mobility comments: received up in chair  Transfers Overall transfer level: Needs assistance Equipment used: 1 person hand held assist Transfers: Sit to/from Stand Sit to Stand: Min assist         General transfer comment: Min assist to power up to standing x3  Ambulation/Gait Ambulation/Gait assistance: Min assist Gait Distance (Feet): 20 Feet Assistive device: Rolling walker (2 wheeled) Gait Pattern/deviations: Step-to pattern;Step-through pattern;Wide base of support;Trunk flexed Gait velocity: decreased   General Gait Details: patient with flexed over the RW, poor ability to come to upright. Short segmental shuffling steps   Stairs             Wheelchair Mobility    Modified Rankin (Stroke Patients Only)       Balance Overall balance assessment: Needs assistance Sitting-balance support: Feet supported Sitting balance-Leahy Scale: Fair Sitting balance - Comments: able to sit at EOB   Standing balance  support: During functional activity Standing balance-Leahy Scale: Poor Standing balance comment: Relies on B UE support in standing.                             Cognition Arousal/Alertness: Awake/alert Behavior During Therapy: WFL for tasks assessed/performed Overall Cognitive Status: History of cognitive impairments - at baseline Area of Impairment: Attention;Memory;Following commands;Safety/judgement;Awareness;Problem solving                   Current Attention Level: Selective Memory: Decreased short-term memory Following Commands: Follows one step commands consistently;Follows multi-step commands inconsistently Safety/Judgement: Decreased awareness of safety;Decreased awareness of deficits Awareness: Emergent Problem Solving: Slow processing;Difficulty sequencing;Decreased initiation General Comments: Continues to show difficulty with insight and awareness throughout session      Exercises      General Comments        Pertinent Vitals/Pain Faces Pain Scale: Hurts even more Pain Location: right flank/abdomen incision Pain Descriptors / Indicators: Sore    Home Living                      Prior Function            PT Goals (current goals can now be found in the care plan section) Acute Rehab PT Goals Patient Stated Goal: to walk better PT Goal Formulation: With patient Time For Goal Achievement: 02/03/18 Potential to Achieve Goals: Good Progress towards PT goals: Progressing toward goals    Frequency    Min 3X/week      PT Plan Current  plan remains appropriate    Co-evaluation              AM-PAC PT "6 Clicks" Daily Activity  Outcome Measure  Difficulty turning over in bed (including adjusting bedclothes, sheets and blankets)?: A Little Difficulty moving from lying on back to sitting on the side of the bed? : Unable Difficulty sitting down on and standing up from a chair with arms (e.g., wheelchair, bedside commode,  etc,.)?: Unable Help needed moving to and from a bed to chair (including a wheelchair)?: A Lot Help needed walking in hospital room?: A Lot Help needed climbing 3-5 steps with a railing? : Total 6 Click Score: 10    End of Session Equipment Utilized During Treatment: Gait belt Activity Tolerance: Patient limited by fatigue Patient left: in chair;with call bell/phone within reach;with chair alarm set Nurse Communication: Mobility status PT Visit Diagnosis: Other symptoms and signs involving the nervous system (R29.898);Muscle weakness (generalized) (M62.81)     Time: 8887-5797 PT Time Calculation (min) (ACUTE ONLY): 16 min  Charges:  $Therapeutic Activity: 8-22 mins                    G Codes:       Alben Deeds, PT DPT  Board Certified Neurologic Specialist [place] 01/23/2018, 4:10 PM

## 2018-01-29 DIAGNOSIS — E1159 Type 2 diabetes mellitus with other circulatory complications: Secondary | ICD-10-CM | POA: Diagnosis not present

## 2018-01-29 DIAGNOSIS — D352 Benign neoplasm of pituitary gland: Secondary | ICD-10-CM | POA: Diagnosis not present

## 2018-01-29 DIAGNOSIS — J019 Acute sinusitis, unspecified: Secondary | ICD-10-CM | POA: Diagnosis not present

## 2018-01-29 DIAGNOSIS — K760 Fatty (change of) liver, not elsewhere classified: Secondary | ICD-10-CM | POA: Diagnosis not present

## 2018-01-29 DIAGNOSIS — E1151 Type 2 diabetes mellitus with diabetic peripheral angiopathy without gangrene: Secondary | ICD-10-CM | POA: Diagnosis not present

## 2018-01-29 DIAGNOSIS — Z7984 Long term (current) use of oral hypoglycemic drugs: Secondary | ICD-10-CM | POA: Diagnosis not present

## 2018-01-29 DIAGNOSIS — I152 Hypertension secondary to endocrine disorders: Secondary | ICD-10-CM | POA: Diagnosis not present

## 2018-01-29 DIAGNOSIS — Z483 Aftercare following surgery for neoplasm: Secondary | ICD-10-CM | POA: Diagnosis not present

## 2018-01-29 DIAGNOSIS — I878 Other specified disorders of veins: Secondary | ICD-10-CM | POA: Diagnosis not present

## 2018-01-29 DIAGNOSIS — Z7951 Long term (current) use of inhaled steroids: Secondary | ICD-10-CM | POA: Diagnosis not present

## 2018-01-29 DIAGNOSIS — K219 Gastro-esophageal reflux disease without esophagitis: Secondary | ICD-10-CM | POA: Diagnosis not present

## 2018-01-30 ENCOUNTER — Other Ambulatory Visit: Payer: Self-pay | Admitting: Internal Medicine

## 2018-01-30 ENCOUNTER — Telehealth: Payer: Self-pay | Admitting: Internal Medicine

## 2018-01-30 DIAGNOSIS — J45909 Unspecified asthma, uncomplicated: Secondary | ICD-10-CM

## 2018-01-30 NOTE — Telephone Encounter (Signed)
Rec'd call from [facility] Physical therapist Advertising account planner) requesting PT for 2x a wk for 4 wks to work on [place], endurance, ambulation, Nursing and a  Education officer, museum. PT Advertising account planner)  also noted the pt is wheezing in left lower Lobe and could a VO for a mobile chest x-ray be given for this patient if possible vs an office visit appointment.

## 2018-01-31 ENCOUNTER — Telehealth: Payer: Self-pay | Admitting: *Deleted

## 2018-01-31 NOTE — Telephone Encounter (Signed)
Information was sent to CoverMyMeds for PA for Esomeprazole Magnesium 20 mg.  Approved starting 07/23/2017 thru 07/24/2018.  Sharol Harness

## 2018-02-02 DIAGNOSIS — D352 Benign neoplasm of pituitary gland: Secondary | ICD-10-CM | POA: Diagnosis not present

## 2018-02-02 DIAGNOSIS — I152 Hypertension secondary to endocrine disorders: Secondary | ICD-10-CM | POA: Diagnosis not present

## 2018-02-02 DIAGNOSIS — J019 Acute sinusitis, unspecified: Secondary | ICD-10-CM | POA: Diagnosis not present

## 2018-02-02 DIAGNOSIS — Z483 Aftercare following surgery for neoplasm: Secondary | ICD-10-CM | POA: Diagnosis not present

## 2018-02-02 DIAGNOSIS — K760 Fatty (change of) liver, not elsewhere classified: Secondary | ICD-10-CM | POA: Diagnosis not present

## 2018-02-02 DIAGNOSIS — E1159 Type 2 diabetes mellitus with other circulatory complications: Secondary | ICD-10-CM | POA: Diagnosis not present

## 2018-02-05 DIAGNOSIS — I152 Hypertension secondary to endocrine disorders: Secondary | ICD-10-CM | POA: Diagnosis not present

## 2018-02-05 DIAGNOSIS — E1159 Type 2 diabetes mellitus with other circulatory complications: Secondary | ICD-10-CM | POA: Diagnosis not present

## 2018-02-05 DIAGNOSIS — D352 Benign neoplasm of pituitary gland: Secondary | ICD-10-CM | POA: Diagnosis not present

## 2018-02-05 DIAGNOSIS — J019 Acute sinusitis, unspecified: Secondary | ICD-10-CM | POA: Diagnosis not present

## 2018-02-05 DIAGNOSIS — Z483 Aftercare following surgery for neoplasm: Secondary | ICD-10-CM | POA: Diagnosis not present

## 2018-02-05 DIAGNOSIS — K760 Fatty (change of) liver, not elsewhere classified: Secondary | ICD-10-CM | POA: Diagnosis not present

## 2018-02-05 NOTE — Telephone Encounter (Signed)
I am okay with both PT and getting a chest x-ray.

## 2018-02-06 NOTE — Telephone Encounter (Signed)
Called left amber vmail on secure line

## 2018-02-08 DIAGNOSIS — Z483 Aftercare following surgery for neoplasm: Secondary | ICD-10-CM | POA: Diagnosis not present

## 2018-02-08 DIAGNOSIS — D352 Benign neoplasm of pituitary gland: Secondary | ICD-10-CM | POA: Diagnosis not present

## 2018-02-08 DIAGNOSIS — J019 Acute sinusitis, unspecified: Secondary | ICD-10-CM | POA: Diagnosis not present

## 2018-02-08 DIAGNOSIS — K760 Fatty (change of) liver, not elsewhere classified: Secondary | ICD-10-CM | POA: Diagnosis not present

## 2018-02-08 DIAGNOSIS — I152 Hypertension secondary to endocrine disorders: Secondary | ICD-10-CM | POA: Diagnosis not present

## 2018-02-08 DIAGNOSIS — E1159 Type 2 diabetes mellitus with other circulatory complications: Secondary | ICD-10-CM | POA: Diagnosis not present

## 2018-02-13 DIAGNOSIS — Z483 Aftercare following surgery for neoplasm: Secondary | ICD-10-CM | POA: Diagnosis not present

## 2018-02-13 DIAGNOSIS — D352 Benign neoplasm of pituitary gland: Secondary | ICD-10-CM | POA: Diagnosis not present

## 2018-02-13 DIAGNOSIS — I152 Hypertension secondary to endocrine disorders: Secondary | ICD-10-CM | POA: Diagnosis not present

## 2018-02-13 DIAGNOSIS — K760 Fatty (change of) liver, not elsewhere classified: Secondary | ICD-10-CM | POA: Diagnosis not present

## 2018-02-13 DIAGNOSIS — E1159 Type 2 diabetes mellitus with other circulatory complications: Secondary | ICD-10-CM | POA: Diagnosis not present

## 2018-02-13 DIAGNOSIS — J019 Acute sinusitis, unspecified: Secondary | ICD-10-CM | POA: Diagnosis not present

## 2018-02-15 DIAGNOSIS — K760 Fatty (change of) liver, not elsewhere classified: Secondary | ICD-10-CM | POA: Diagnosis not present

## 2018-02-15 DIAGNOSIS — D352 Benign neoplasm of pituitary gland: Secondary | ICD-10-CM | POA: Diagnosis not present

## 2018-02-15 DIAGNOSIS — E1159 Type 2 diabetes mellitus with other circulatory complications: Secondary | ICD-10-CM | POA: Diagnosis not present

## 2018-02-15 DIAGNOSIS — J019 Acute sinusitis, unspecified: Secondary | ICD-10-CM | POA: Diagnosis not present

## 2018-02-15 DIAGNOSIS — I152 Hypertension secondary to endocrine disorders: Secondary | ICD-10-CM | POA: Diagnosis not present

## 2018-02-15 DIAGNOSIS — Z483 Aftercare following surgery for neoplasm: Secondary | ICD-10-CM | POA: Diagnosis not present

## 2018-02-16 DIAGNOSIS — E1159 Type 2 diabetes mellitus with other circulatory complications: Secondary | ICD-10-CM | POA: Diagnosis not present

## 2018-02-16 DIAGNOSIS — I152 Hypertension secondary to endocrine disorders: Secondary | ICD-10-CM | POA: Diagnosis not present

## 2018-02-16 DIAGNOSIS — D352 Benign neoplasm of pituitary gland: Secondary | ICD-10-CM | POA: Diagnosis not present

## 2018-02-16 DIAGNOSIS — Z483 Aftercare following surgery for neoplasm: Secondary | ICD-10-CM | POA: Diagnosis not present

## 2018-02-16 DIAGNOSIS — K760 Fatty (change of) liver, not elsewhere classified: Secondary | ICD-10-CM | POA: Diagnosis not present

## 2018-02-16 DIAGNOSIS — J019 Acute sinusitis, unspecified: Secondary | ICD-10-CM | POA: Diagnosis not present

## 2018-02-27 ENCOUNTER — Other Ambulatory Visit: Payer: Self-pay | Admitting: Internal Medicine

## 2018-03-01 ENCOUNTER — Telehealth: Payer: Self-pay | Admitting: Internal Medicine

## 2018-03-01 DIAGNOSIS — E1159 Type 2 diabetes mellitus with other circulatory complications: Secondary | ICD-10-CM | POA: Diagnosis not present

## 2018-03-01 DIAGNOSIS — D352 Benign neoplasm of pituitary gland: Secondary | ICD-10-CM | POA: Diagnosis not present

## 2018-03-01 DIAGNOSIS — J019 Acute sinusitis, unspecified: Secondary | ICD-10-CM | POA: Diagnosis not present

## 2018-03-01 DIAGNOSIS — I152 Hypertension secondary to endocrine disorders: Secondary | ICD-10-CM | POA: Diagnosis not present

## 2018-03-01 DIAGNOSIS — K760 Fatty (change of) liver, not elsewhere classified: Secondary | ICD-10-CM | POA: Diagnosis not present

## 2018-03-01 DIAGNOSIS — Z483 Aftercare following surgery for neoplasm: Secondary | ICD-10-CM | POA: Diagnosis not present

## 2018-03-01 NOTE — Telephone Encounter (Signed)
Agree with assessment.  Thanks

## 2018-03-01 NOTE — Telephone Encounter (Signed)
HHN was called back, had her redo VS, HR 103, BP 156/80, pt c/o pain ble, swelling to ble, states some pitting edema?? Pt temp 99.9 orally, lungs sound clear. Pt has not been up exercising or working she has been at rest the whole time. She does report being tired.  With original hr 170 bp 160/80 t 99.9 She is ask to have pt go to urg care or ED for eval

## 2018-03-01 NOTE — Telephone Encounter (Signed)
Nikki BP elevate 160/80, heartrate 170 beats per min and low grade temp 99 from [place]

## 2018-03-06 ENCOUNTER — Ambulatory Visit (INDEPENDENT_AMBULATORY_CARE_PROVIDER_SITE_OTHER): Payer: Medicare Other | Admitting: Internal Medicine

## 2018-03-06 ENCOUNTER — Other Ambulatory Visit: Payer: Self-pay

## 2018-03-06 DIAGNOSIS — I890 Lymphedema, not elsewhere classified: Secondary | ICD-10-CM

## 2018-03-06 DIAGNOSIS — I872 Venous insufficiency (chronic) (peripheral): Secondary | ICD-10-CM

## 2018-03-06 DIAGNOSIS — Z86011 Personal history of benign neoplasm of the brain: Secondary | ICD-10-CM

## 2018-03-06 DIAGNOSIS — L859 Epidermal thickening, unspecified: Secondary | ICD-10-CM | POA: Diagnosis not present

## 2018-03-06 DIAGNOSIS — M7989 Other specified soft tissue disorders: Secondary | ICD-10-CM | POA: Insufficient documentation

## 2018-03-06 NOTE — Progress Notes (Signed)
CC: Follow up of chronic venous stasis dermatitis and lymphedema   HPI:  [name] is a 67 y.o. female with PMH listed below who presents to clinic for follow up of chronic venous stasis dermatitis and lymphedema.  Chronic venous stasis dermatitis and lymphedema: Mariah [name] presents today with worsening LE swelling and LE rash. She was last seen on 08/2017 for this at which time she was referred to the wound care center for ACE wrap to help with edema. She was also advised to keep legs elevated and use compression stockings which she has been using consistently during the day. She has not noticed any changed in swelling though and reports wound care center did not think she needed leg wrapping as she did not have any wounds. She is requesting a rollator for help with ambulation. Med list includes Lasix, but she does not know if she is taking it as she receives all her medicines in a pill pack. On exam, LE swelling appears stable when compared to pictures in the chart from 6 months ago. Her LLE verroucous, hyperkeratotic lesions do appear larger in size and she has new lesions similar in appearance in the RLE. I recommended continuing supportive care with compression stockings. We will call Mariah [name] to have patient go back to wound care center. Will not prescribe Lasix as she may or may not be already on it. Her medication list is not up to date (on ACEi and ARB). She did not bring her medicines today and is not able to name them either. She also underwent transphenoidal resection of pituitary macroadenoma recently (01/19/2018) and has no support at home. I worry she might not be able to take care of herself at home given her disheveled appearance today. I recommended a more comprehensive visit as she has not been seen since 08/2017 and has has significant changed in health. Asked her to schedule a AWV with her PCP.  - Continue compression stockings  - DME order placed for rollator  - Contact HH [name]  to help with wound care center visit  - AWV with Mariah [name], Mariah [name] to help coordinate this   Past Medical History:  Diagnosis Date  . Allergic rhinitis   . Arthritis    "ankles, feet, knees" (10/30/2017)  . Asthma   . Bursitis   . Carpal tunnel syndrome   . GERD (gastroesophageal reflux disease)   . HTN (hypertension)   . Migraine    "a couple/yr" (10/30/2017)  . Obesity   . Peripheral vascular disease ([place])   . Pneumonia 1960s X 1  . Sciatica   . Tendinitis   . Type II diabetes mellitus (HCC)    Type II   Review of Systems:   Review of Systems  Constitutional: Positive for malaise/fatigue. Negative for chills and fever.  Respiratory: Positive for shortness of breath. Negative for cough.   Cardiovascular: Positive for leg swelling. Negative for chest pain and palpitations.  Musculoskeletal: Positive for joint pain and myalgias.  Skin: Positive for itching and rash.   Physical Exam: Vitals:   03/06/18 1541  BP: 135/66  Pulse: 86  Temp: 98.4 F (36.9 C)  TempSrc: Oral  SpO2: 100%  Weight: 223 lb (101.2 kg)  Height: 5\' 1"  (1.549 m)   General: obese female who appears unkempt, clothes with urine smell, falling asleep throughout encounter, in no acute distress CV: RRR, nl S1/S2, no mrg  Ext: non-pitting LE edema bilaterally  Skin: LLE verrucous, hyperkeratotic lesions  larger in size. New RLE lesion of similar appearance. No wounds appreciated.   Assessment & Plan:   See Encounters Tab for problem based charting.   Patient discussed with Dr. Lynnae January

## 2018-03-06 NOTE — Patient Instructions (Signed)
Ms. Legere,   Please schedule an annual wellness visit with you primary care doctor (Dr. Reesa Chew).   I placed an order for your rollator. They will call you to set this up.   Please call us if you have any questions or concerns.  - Dr. Frederico Hamman

## 2018-03-07 ENCOUNTER — Encounter: Payer: Self-pay | Admitting: Internal Medicine

## 2018-03-07 NOTE — Assessment & Plan Note (Signed)
Chronic venous stasis dermatitis and lymphedema: Ms. Mariah [name] presents today with worsening LE swelling and LE rash. She was last seen on 08/2017 for this at which time she was referred to the wound care center for ACE wrap to help with edema. She was also advised to keep legs elevated and use compression stockings which she has been using consistently during the day. She has not noticed any changed in swelling though and reports wound care center did not think she needed leg wrapping as she did not have any wounds. She is requesting a rollator for help with ambulation. Med list includes Lasix, but she does not know if she is taking it as she receives all her medicines in a pill pack. On exam, LE swelling appears stable when compared to pictures in the chart from 6 months ago. Her LLE verroucous, hyperkeratotic lesions do appear larger in size and she has new lesions similar in appearance in the RLE. I recommended continuing supportive care with compression stockings. We will call [facility] RN to have patient go back to wound care center. Will not prescribe Lasix as she may or may not be already on it. Her medication list is not up to date (on ACEi and ARB). She did not bring her medicines today and is not able to name them either. She also underwent transphenoidal resection of pituitary macroadenoma recently (01/19/2018) and has no support at home. I worry she might not be able to take care of herself at home given her disheveled appearance today. I recommended a more comprehensive visit as she has not been seen since 08/2017 and has has significant changed in health. Asked her to schedule a AWV with her PCP.  - Continue compression stockings  - DME order placed for rollator  - Contact HH RN to help with wound care center visit  - AWV with Dr. Reesa Chew, messaged Doris to help coordinate this

## 2018-03-07 NOTE — Progress Notes (Signed)
Case discussed with Dr. Santos-Sanchez at the time of the visit.  We reviewed the resident's history and exam and pertinent patient test results.  I agree with the assessment, diagnosis, and plan of care documented in the resident's note.  

## 2018-03-08 DIAGNOSIS — J019 Acute sinusitis, unspecified: Secondary | ICD-10-CM | POA: Diagnosis not present

## 2018-03-08 DIAGNOSIS — E1159 Type 2 diabetes mellitus with other circulatory complications: Secondary | ICD-10-CM | POA: Diagnosis not present

## 2018-03-08 DIAGNOSIS — I152 Hypertension secondary to endocrine disorders: Secondary | ICD-10-CM | POA: Diagnosis not present

## 2018-03-08 DIAGNOSIS — K760 Fatty (change of) liver, not elsewhere classified: Secondary | ICD-10-CM | POA: Diagnosis not present

## 2018-03-08 DIAGNOSIS — D352 Benign neoplasm of pituitary gland: Secondary | ICD-10-CM | POA: Diagnosis not present

## 2018-03-08 DIAGNOSIS — Z483 Aftercare following surgery for neoplasm: Secondary | ICD-10-CM | POA: Diagnosis not present

## 2018-04-23 ENCOUNTER — Ambulatory Visit: Payer: Medicare Other | Admitting: Dietician

## 2018-04-23 ENCOUNTER — Encounter: Payer: Medicare Other | Admitting: Internal Medicine

## 2018-04-23 ENCOUNTER — Encounter: Payer: Self-pay | Admitting: Internal Medicine

## 2018-04-25 ENCOUNTER — Encounter (HOSPITAL_COMMUNITY): Payer: Self-pay

## 2018-04-25 ENCOUNTER — Emergency Department (HOSPITAL_COMMUNITY)
Admission: EM | Admit: 2018-04-25 | Discharge: 2018-04-25 | Disposition: A | Discharge status: Home / Self Care | Payer: Medicare Other | Attending: Emergency Medicine | Admitting: Emergency Medicine

## 2018-04-25 DIAGNOSIS — E119 Type 2 diabetes mellitus without complications: Secondary | ICD-10-CM | POA: Insufficient documentation

## 2018-04-25 DIAGNOSIS — Z79899 Other long term (current) drug therapy: Secondary | ICD-10-CM | POA: Diagnosis not present

## 2018-04-25 DIAGNOSIS — R52 Pain, unspecified: Secondary | ICD-10-CM | POA: Diagnosis not present

## 2018-04-25 DIAGNOSIS — I10 Essential (primary) hypertension: Secondary | ICD-10-CM | POA: Insufficient documentation

## 2018-04-25 DIAGNOSIS — M79661 Pain in right lower leg: Secondary | ICD-10-CM | POA: Diagnosis not present

## 2018-04-25 DIAGNOSIS — M7989 Other specified soft tissue disorders: Secondary | ICD-10-CM | POA: Diagnosis not present

## 2018-04-25 DIAGNOSIS — Z7984 Long term (current) use of oral hypoglycemic drugs: Secondary | ICD-10-CM | POA: Insufficient documentation

## 2018-04-25 DIAGNOSIS — M79662 Pain in left lower leg: Secondary | ICD-10-CM | POA: Diagnosis not present

## 2018-04-25 DIAGNOSIS — J45909 Unspecified asthma, uncomplicated: Secondary | ICD-10-CM | POA: Insufficient documentation

## 2018-04-25 DIAGNOSIS — R609 Edema, unspecified: Secondary | ICD-10-CM | POA: Diagnosis not present

## 2018-04-25 LAB — COMPREHENSIVE METABOLIC PANEL
ALT: 13 U/L (ref 0–44)
AST: 18 U/L (ref 15–41)
Albumin: 3.2 g/dL — ABNORMAL LOW (ref 3.5–5.0)
Alkaline Phosphatase: 67 U/L (ref 38–126)
Anion gap: 9 (ref 5–15)
BUN: 13 mg/dL (ref 8–23)
CHLORIDE: 101 mmol/L (ref 98–111)
CO2: 25 mmol/L (ref 22–32)
CREATININE: 0.92 mg/dL (ref 0.44–1.00)
Calcium: 8.9 mg/dL (ref 8.9–10.3)
GFR calc Af Amer: >60 mL/min (ref >60)
GLUCOSE: 137 mg/dL — AB (ref 70–99)
Potassium: 3.4 mmol/L — ABNORMAL LOW (ref 3.5–5.1)
Sodium: 135 mmol/L (ref 135–145)
Total Bilirubin: 0.4 mg/dL (ref 0.3–1.2)
Total Protein: 9 g/dL — ABNORMAL HIGH (ref 6.5–8.1)

## 2018-04-25 LAB — CBC WITH DIFFERENTIAL/PLATELET
ABS IMMATURE GRANULOCYTES: 0.1 10*3/uL (ref 0.0–0.1)
Basophils Absolute: 0 10*3/uL (ref 0.0–0.1)
Basophils Relative: 0 %
Eosinophils Absolute: 0.4 10*3/uL (ref 0.0–0.7)
Eosinophils Relative: 3 %
HEMATOCRIT: 37.8 % (ref 36.0–46.0)
Hemoglobin: 11.5 g/dL — ABNORMAL LOW (ref 12.0–15.0)
Immature Granulocytes: 0 %
LYMPHS ABS: 1.7 10*3/uL (ref 0.7–4.0)
Lymphocytes Relative: 12 %
MCH: 28.2 pg (ref 26.0–34.0)
MCHC: 30.4 g/dL (ref 30.0–36.0)
MCV: 92.6 fL (ref 78.0–100.0)
MONOS PCT: 7 %
Monocytes Absolute: 0.9 10*3/uL (ref 0.1–1.0)
NEUTROS ABS: 10.7 10*3/uL — AB (ref 1.7–7.7)
Neutrophils Relative %: 78 %
PLATELETS: 397 10*3/uL (ref 150–400)
RBC: 4.08 MIL/uL (ref 3.87–5.11)
RDW: 15.4 % (ref 11.5–15.5)
WBC: 13.7 10*3/uL — ABNORMAL HIGH (ref 4.0–10.5)

## 2018-04-25 LAB — I-STAT CG4 LACTIC ACID, ED: Lactic Acid, Venous: 1.06 mmol/L (ref 0.5–1.9)

## 2018-04-25 MED ORDER — ACETAMINOPHEN 500 MG PO TABS
500.0000 mg | ORAL_TABLET | Freq: Once | ORAL | Status: AC
  Administered 2018-04-25: 500 mg via ORAL
  Filled 2018-04-25: qty 1

## 2018-04-25 NOTE — Discharge Instructions (Addendum)
Were evaluated today for bilateral leg pain and swelling.  Lab work was negative.  Will need to follow-up with vascular surgery, wound care him you state you have been referred to previously however have not been able to make an appointment with.  Please return to the emergency department any new or worsening symptoms such as:  Chest pain, Shortness of breath, fever, chills, worsening leg swelling, redness or warmth to lower extremities.

## 2018-04-25 NOTE — ED Provider Notes (Signed)
[facility] Provider Note   CSN: [id number] Arrival date & time: 04/25/18  0543   History   Chief Complaint No chief complaint on file.   HPI Mariah [name] is a 67 y.o. female with a past medical history significant for PVD, obesity, edema, DM who presents for evaluation of bilateral lower extremity pain and swelling. Patient states that she has been seen for this issue multiple times with outpatient referral to wound care and vascular surgery.  Patient states she was doing laundry this morning and sat down on the floor to fold it and was unable to stand back up.  Patient called EMS assist with standing.  Her leg pain and swelling began 8-10 years prior. Patient states that she has not been to vascular surgery. Patient states that her pain and swelling has not been worse, however "im just tired of this." She rates her pain a 6/10 to bilateral lower extremities. Denies fever, chills, nausea, vomiting, chest pain, SOB, DOE, PND, numbness, dysuria, foul smelling urine, tingling, weakness, warmth or redness to lower extremities.  Denies aggravating or alleviating.   HPI  Past Medical History:  Diagnosis Date  . Allergic rhinitis   . Arthritis    "ankles, feet, knees" (10/30/2017)  . Asthma   . Bursitis   . Carpal tunnel syndrome   . GERD (gastroesophageal reflux disease)   . HTN (hypertension)   . Migraine    "a couple/yr" (10/30/2017)  . Obesity   . Peripheral vascular disease ([place])   . Pneumonia 1960s X 1  . Sciatica   . Tendinitis   . Type II diabetes mellitus ([place])    Type II    Patient Active Problem List   Diagnosis Date Noted  . Bilateral leg swelling 03/06/2018  . 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 Hypoglycemia 11/07/2017  . 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 Vaginal bleeding 11/06/2017  . Acute encephalopathy 10/30/2017  . Postmenopausal bleeding 09/01/2017  . Screening for colon  cancer 09/01/2017  . Thyroid nodule 09/01/2017  . Daytime sleepiness 08/15/2017  . Bilateral knee pain 08/15/2017  . Fall 08/15/2017  . Screening for breast cancer 05/25/2017  . NAFLD (nonalcoholic fatty liver disease) 05/24/2017  . Morbid obesity ([place]) 10/21/2012  . Type 2 diabetes mellitus treated without insulin ([place]) 09/18/2012  . FIBROIDS, UTERUS 07/10/2008  . Chronic venous stasis dermatitis of both lower extremities 07/10/2008  . Hypertension associated with diabetes ([place]) 04/26/2007  . Allergic rhinitis 04/26/2007  . Asthma 04/26/2007    Past Surgical History:  Procedure Laterality Date  . CRANIOTOMY N/A 01/19/2018   Procedure: ENDOSCOPIC TRANSPHENOIDAL RESECTION OF PITUITARY TUMOR;  Surgeon: Consuella Lose, MD;  Location: [facility];  Service: Neurosurgery;  Laterality: N/A;  ENDOSCOPIC TRANSPHENOIDAL RESECTION OF PITUITARY TUMOR  . GANGLION CYST EXCISION Left   . KNEE ARTHROSCOPY Left 2003  . PLANTAR FASCIA RELEASE Right 1995   "heel"  . SINUS ENDO WITH FUSION N/A 01/19/2018   Procedure: SINUS ENDO WITH FUSION;  Surgeon: Jerrell Belfast, MD;  Location: [facility];  Service: ENT;  Laterality: N/A;  . TONSILLECTOMY  1958  . TRANSPHENOIDAL APPROACH EXPOSURE N/A 01/19/2018   Procedure: TRANSPHENOIDAL  RESECTION OF PITUITARY TUMOR;  Surgeon: Jerrell Belfast, MD;  Location: [facility];  Service: ENT;  Laterality: N/A;  . [place]; 1997   Dr [name]     OB History   None      Home  Medications    Prior to Admission medications   Medication Sig Start Date End Date Taking? Authorizing Provider  ADVAIR DISKUS 100-50 MCG/DOSE AEPB Inhale 1 puff into the lungs 2 (two) times daily. 01/30/18   [name], MD  albuterol (PROVENTIL HFA;VENTOLIN HFA) 108 (90 Base) MCG/ACT inhaler Inhale 2 puffs into the lungs every 6 (six) hours as needed for wheezing or shortness of breath. 01/30/18   [name], MD  amLODipine (NORVASC) 10 MG tablet TAKE ONE TABLET BY MOUTH EVERY DAY  01/02/18   [name], MD  AQUALANCE LANCETS 30G MISC USE AS DIRECTED TWICE DAILY 11/17/17   [name], MD  esomeprazole (NEXIUM) 20 MG capsule Take 1 capsule (20 mg total) by mouth every morning. For acid reflex 11/16/17   Medina-Vargas, Monina C, NP  furosemide (LASIX) 40 MG tablet TAKE ONE TABLET BY MOUTH EVERY DAY 02/27/18   Axel Filler, MD  glipiZIDE (GLUCOTROL XL) 10 MG 24 hr tablet TAKE TWO TABLETS BY MOUTH EVERY DAY 01/30/18   [name], MD  HYDROcodone-acetaminophen (NORCO/VICODIN) 5-325 MG tablet Take 1 tablet by mouth every 4 (four) hours as needed for moderate pain. 01/23/18   Costella, Vista Mink, PA-C  levETIRAcetam (KEPPRA) 500 MG tablet Take 1 tablet (500 mg total) by mouth 2 (two) times daily. 01/23/18   Costella, Vista Mink, PA-C  levofloxacin (LEVAQUIN) 500 MG tablet Take 1 tablet (500 mg total) by mouth daily. 1 tab po qd 01/19/18   Jerrell Belfast, MD  lisinopril (PRINIVIL,ZESTRIL) 40 MG tablet Take 40 mg by mouth daily. 12/04/17   [provider]  losartan (COZAAR) 100 MG tablet Take 1 tablet (100 mg total) by mouth daily. Patient not taking: Reported on 12/13/2017 11/16/17   Medina-Vargas, Monina C, NP  mometasone (NASONEX) 50 MCG/ACT nasal spray Place 1 spray into the nose as needed. Patient taking differently: Place 1 spray into the nose as needed (allergies).  12/12/17   [name], MD  naproxen (NAPROSYN) 500 MG tablet Take 1 tablet (500 mg total) by mouth 2 (two) times daily with a meal. Patient taking differently: Take 500 mg by mouth 2 (two) times daily as needed for moderate pain.  08/15/17 08/15/18  Alphonzo Grieve, MD  sitaGLIPtin-metformin (JANUMET) 50-1000 MG tablet Take 1 tablet by mouth 2 (two) times daily with a meal. 01/10/18   [name], MD  TRUE METRIX BLOOD GLUCOSE TEST test strip USE AS DIRECTED TWICE DAILY 11/17/17   [name], MD    Family History Family History  Problem Relation Age of Onset  . Uterine cancer Mother   .  Hypertension Mother   . Heart disease Mother   . Hypertension Father   . Alzheimer's disease Father   . Cancer Other     Social History Social History   Tobacco Use  . Smoking status: Never Smoker  . Smokeless tobacco: Never Used  Substance Use Topics  . Alcohol use: No  . Drug use: No     Allergies   Penicillins; Codeine; and Indomethacin   Review of Systems Review of Systems  Constitutional: Negative for activity change, appetite change, chills, diaphoresis, fatigue, fever and unexpected weight change.  Respiratory: Negative.   Cardiovascular: Negative.   Gastrointestinal: Negative.   Musculoskeletal: Positive for gait problem. Negative for back pain, neck pain and neck stiffness.  Skin: Positive for wound.     Physical Exam Updated Vital Signs BP (!) 132/99   Pulse 92   Temp 98.1 F (36.7 C) (Oral)  Resp 16   Ht 5\' 1"  (1.549 m)   LMP 12/19/2017   SpO2 94%   BMI 42.14 kg/m   Physical Exam  Constitutional: No distress.  Appears disheveled.  HENT:  Head: Atraumatic.  Eyes: Pupils are equal, round, and reactive to light.  Neck: Normal range of motion.  Cardiovascular: Normal rate, regular rhythm, normal heart sounds and intact distal pulses.  Intact distal pulses  Pulmonary/Chest: Effort normal and breath sounds normal. No stridor. No respiratory distress. She has no wheezes. She has no rales. She exhibits no tenderness.  Abdominal: Soft. Bowel sounds are normal. She exhibits no shifting dullness, no distension, no pulsatile liver, no fluid wave, no abdominal bruit, no ascites, no pulsatile midline mass and no mass. There is no tenderness. There is no rigidity, no rebound and no guarding. No hernia.  Abdomen is soft, nonrigid.  No evidence of ascites, shifting dullness.  Musculoskeletal: Normal range of motion.  Neurological: She is alert. She has normal strength.  Intact sensation to sharp and dull.  Skin: Skin is warm and dry. No abrasion, no bruising  and no ecchymosis noted. She is not diaphoretic.  Nonpitting edema to bilateral lower extremities. Multiple hyperkeratotic lesions to bilateral lower extremities. No erythema, warmth. One lesion has dried serosanguinous fluid.  No evidence of cellulitis, abscess, infection.  No skin ulcerations.  Psychiatric: She has a normal mood and affect. Her speech is not slurred. She is not slowed and not withdrawn.  Nursing note and vitals reviewed.    ED Treatments / Results  Labs (all labs ordered are listed, but only abnormal results are displayed) Labs Reviewed  CBC WITH DIFFERENTIAL/PLATELET - Abnormal; Notable for the following components:      Result Value   WBC 13.7 (*)    Hemoglobin 11.5 (*)    Neutro Abs 10.7 (*)    All other components within normal limits  COMPREHENSIVE METABOLIC PANEL - Abnormal; Notable for the following components:   Potassium 3.4 (*)    Glucose, Bld 137 (*)    Total Protein 9.0 (*)    Albumin 3.2 (*)    All other components within normal limits  I-STAT CG4 LACTIC ACID, ED  I-STAT CG4 LACTIC ACID, ED    EKG None  Radiology No results found.  Procedures Procedures (including critical care time)  Medications Ordered in ED Medications - No data to display   Initial Impression / Assessment and Plan / ED Course  I have reviewed the triage vital signs and the nursing notes pulse past medical history.  Pertinent labs & imaging results that were available during my care of the patient were reviewed by me and considered in my medical decision making (see chart for details).  66 year old female with lymphedema and peripheral vascular disease presents for evaluation of chronic bilateral leg swelling and pain.  Afebrile, nonseptic, non-ill-appearing.  Appears disheveled, review of past notes from her outpatient providers and this seems to be a chronic review of patient.  Per patient she has had this for 8 to 10 years however is "tired of this."  Has had multiple  referrals to wound clinic and vascular surgery, however patient has not followed up with this.  Patient was seen approximately 5 weeks ago that for this with primary care was supposed to follow-up with wound clinic and vascular surgery.  Is currently on Lasix for her leg swelling.  Was seen also in February for this issue on exam her bilateral lower extremities appear unchanged from pictures  during her visit in February.  Not seeing any evidence of secondary infection.  Has peripheral vascular disease.  She has strong pulses distally.  No evidence of cellulitis or trauma or ulcerations.  No shortness of breath, chest pain, PND orthopnea, burning with urination.  Mildly elevated WBC, however seems consistent with all of her other previous visits, at baseline.  Lactic acid is 1.06. Feel her symptoms are most likely chronic in nature.  Patient notified this provider at DC that patient was "extremely sleepy." Reevaluation with attending physician and patient is oriented to person, place, time.  She is able to answer all questions appropriately and follow commands. She is easily arousable and without complaints.  Patient does have a prior diagnosis of daytime somnolence and sleep apnea without CPAP compliance.   Discussed with patient her need for follow-up with wound care and vascular surgery.  Discussed strict return precautions.  Patient voiced understanding is agreeable for follow-up.  Final Clinical Impressions(s) / ED Diagnoses   Final diagnoses:  Leg swelling    ED Discharge Orders    None       Tzipora Mcinroy A, PA-C 04/25/18 1045    Orpah Greek, MD 04/26/18 [phone]

## 2018-04-25 NOTE — ED Notes (Addendum)
Pt had incontince episode upon arrival. Pt has been cleaned and purewick is in place and hooked up to suction canister.

## 2018-04-25 NOTE — ED Triage Notes (Signed)
Pt. Arrived by EMS from home alone with reports of leg pain and stiffness X10 years. Pt. Bilateral legs are weeping with solid like masses on each leg. Pt. Reports placing herself on the floor to do laundry and was not able to get back up. Pt. Unable to bare weight. A/O X4

## 2018-04-27 ENCOUNTER — Other Ambulatory Visit: Payer: Self-pay | Admitting: Internal Medicine

## 2018-04-27 DIAGNOSIS — E119 Type 2 diabetes mellitus without complications: Secondary | ICD-10-CM

## 2018-04-27 DIAGNOSIS — J45909 Unspecified asthma, uncomplicated: Secondary | ICD-10-CM

## 2018-05-03 ENCOUNTER — Other Ambulatory Visit: Payer: Self-pay | Admitting: Physician Assistant

## 2018-05-03 DIAGNOSIS — D352 Benign neoplasm of pituitary gland: Secondary | ICD-10-CM

## 2018-05-16 ENCOUNTER — Other Ambulatory Visit: Payer: Medicare Other

## 2018-05-17 ENCOUNTER — Other Ambulatory Visit: Payer: Self-pay | Admitting: Internal Medicine

## 2018-05-17 ENCOUNTER — Telehealth: Payer: Self-pay | Admitting: Dietician

## 2018-05-17 DIAGNOSIS — W19XXXD Unspecified fall, subsequent encounter: Secondary | ICD-10-CM

## 2018-05-17 NOTE — Telephone Encounter (Signed)
Called Mariah [name] about a CGM in December which she agreed to . She also requests a new orthopedic doctor because hers retired and a Radiation protection practitioner (walker). She is falling in her apartment more often, fell this weekend.   Plan: schedule for Continuous glucose monitor sensor placement in December on same day she sees Dr. Reesa Chew. Will ask medical records/front office to call her about getting a Rollator (walker) and send this not to her PCP. I asked her to call our office if she has questions ro concerns.

## 2018-05-17 NOTE — Telephone Encounter (Signed)
I placed an order for Rollator walker.

## 2018-05-18 NOTE — Telephone Encounter (Signed)
Order faxed to [facility].  The patient can call their number [phone] to follow up.

## 2018-05-25 ENCOUNTER — Other Ambulatory Visit: Payer: Self-pay | Admitting: Internal Medicine

## 2018-05-25 DIAGNOSIS — I10 Essential (primary) hypertension: Principal | ICD-10-CM

## 2018-05-25 DIAGNOSIS — E1159 Type 2 diabetes mellitus with other circulatory complications: Secondary | ICD-10-CM

## 2018-05-26 MED ORDER — LEVETIRACETAM 500 MG PO TABS: 500.0000 mg | ORAL_TABLET | Freq: Two times a day (BID) | ORAL | 0 refills | Status: DC

## 2018-05-30 ENCOUNTER — Telehealth: Payer: Self-pay | Admitting: *Deleted

## 2018-05-30 ENCOUNTER — Other Ambulatory Visit: Payer: Medicare Other

## 2018-05-30 NOTE — Telephone Encounter (Signed)
Received faxed refill request from pt's pharmacy for furosemide 40 mg tab take one daily #90.  Previous refill request was denied "pt needs an appointment".  Pt has an appt scheduled with pcp on 12/16 (no earlier appts avail).  Will deny request and make pt aware that she will need to be seen first.,  [place]11/6/20191:02 PM    Call made to patient-no answer, HIPPA compliant message left on recorder.   Pt will need to be informed that furosemide refill is being denied at this time.  Pt can be offered a sooner appt in [facility] if 12/16 too long to wait.Despina Hidden Cassady11/6/20191:40 PM

## 2018-05-30 NOTE — Telephone Encounter (Signed)
Per chart review Lasix was discontinued. She will need reassessment before starting Lasix.

## 2018-05-31 ENCOUNTER — Other Ambulatory Visit: Payer: Self-pay

## 2018-05-31 ENCOUNTER — Inpatient Hospital Stay (HOSPITAL_COMMUNITY)
Admission: EM | Admit: 2018-05-31 | Discharge: 2018-06-05 | DRG: 603 | Disposition: A | Discharge status: Skilled Nursing Facility | Payer: Medicare Other | Attending: Student in an Organized Health Care Education/Training Program | Admitting: Student in an Organized Health Care Education/Training Program

## 2018-05-31 ENCOUNTER — Emergency Department (HOSPITAL_COMMUNITY): Payer: Medicare Other

## 2018-05-31 ENCOUNTER — Encounter (HOSPITAL_COMMUNITY): Payer: Self-pay

## 2018-05-31 DIAGNOSIS — Z885 Allergy status to narcotic agent status: Secondary | ICD-10-CM

## 2018-05-31 DIAGNOSIS — Z9089 Acquired absence of other organs: Secondary | ICD-10-CM | POA: Diagnosis not present

## 2018-05-31 DIAGNOSIS — Z8249 Family history of ischemic heart disease and other diseases of the circulatory system: Secondary | ICD-10-CM | POA: Diagnosis not present

## 2018-05-31 DIAGNOSIS — W19XXXA Unspecified fall, initial encounter: Secondary | ICD-10-CM | POA: Diagnosis not present

## 2018-05-31 DIAGNOSIS — Z888 Allergy status to other drugs, medicaments and biological substances status: Secondary | ICD-10-CM

## 2018-05-31 DIAGNOSIS — R269 Unspecified abnormalities of gait and mobility: Secondary | ICD-10-CM | POA: Diagnosis not present

## 2018-05-31 DIAGNOSIS — I10 Essential (primary) hypertension: Secondary | ICD-10-CM | POA: Diagnosis present

## 2018-05-31 DIAGNOSIS — N179 Acute kidney failure, unspecified: Secondary | ICD-10-CM | POA: Diagnosis not present

## 2018-05-31 DIAGNOSIS — Z79891 Long term (current) use of opiate analgesic: Secondary | ICD-10-CM

## 2018-05-31 DIAGNOSIS — R4182 Altered mental status, unspecified: Secondary | ICD-10-CM | POA: Diagnosis not present

## 2018-05-31 DIAGNOSIS — M170 Bilateral primary osteoarthritis of knee: Secondary | ICD-10-CM | POA: Diagnosis present

## 2018-05-31 DIAGNOSIS — E876 Hypokalemia: Secondary | ICD-10-CM | POA: Diagnosis not present

## 2018-05-31 DIAGNOSIS — Z79899 Other long term (current) drug therapy: Secondary | ICD-10-CM

## 2018-05-31 DIAGNOSIS — R262 Difficulty in walking, not elsewhere classified: Secondary | ICD-10-CM | POA: Diagnosis not present

## 2018-05-31 DIAGNOSIS — I872 Venous insufficiency (chronic) (peripheral): Secondary | ICD-10-CM | POA: Diagnosis present

## 2018-05-31 DIAGNOSIS — Z791 Long term (current) use of non-steroidal anti-inflammatories (NSAID): Secondary | ICD-10-CM

## 2018-05-31 DIAGNOSIS — Z6836 Body mass index (BMI) 36.0-36.9, adult: Secondary | ICD-10-CM | POA: Diagnosis not present

## 2018-05-31 DIAGNOSIS — S069X9A Unspecified intracranial injury with loss of consciousness of unspecified duration, initial encounter: Secondary | ICD-10-CM | POA: Diagnosis not present

## 2018-05-31 DIAGNOSIS — Z8049 Family history of malignant neoplasm of other genital organs: Secondary | ICD-10-CM | POA: Diagnosis not present

## 2018-05-31 DIAGNOSIS — Z880 Allergy status to penicillin: Secondary | ICD-10-CM

## 2018-05-31 DIAGNOSIS — J45909 Unspecified asthma, uncomplicated: Secondary | ICD-10-CM | POA: Diagnosis not present

## 2018-05-31 DIAGNOSIS — E1151 Type 2 diabetes mellitus with diabetic peripheral angiopathy without gangrene: Secondary | ICD-10-CM | POA: Diagnosis present

## 2018-05-31 DIAGNOSIS — Z7984 Long term (current) use of oral hypoglycemic drugs: Secondary | ICD-10-CM

## 2018-05-31 DIAGNOSIS — M19072 Primary osteoarthritis, left ankle and foot: Secondary | ICD-10-CM | POA: Diagnosis not present

## 2018-05-31 DIAGNOSIS — S299XXA Unspecified injury of thorax, initial encounter: Secondary | ICD-10-CM | POA: Diagnosis not present

## 2018-05-31 DIAGNOSIS — D352 Benign neoplasm of pituitary gland: Secondary | ICD-10-CM | POA: Diagnosis present

## 2018-05-31 DIAGNOSIS — Z820 Family history of epilepsy and other diseases of the nervous system: Secondary | ICD-10-CM

## 2018-05-31 DIAGNOSIS — R531 Weakness: Secondary | ICD-10-CM | POA: Diagnosis not present

## 2018-05-31 DIAGNOSIS — N950 Postmenopausal bleeding: Secondary | ICD-10-CM | POA: Diagnosis present

## 2018-05-31 DIAGNOSIS — I890 Lymphedema, not elsewhere classified: Secondary | ICD-10-CM | POA: Diagnosis present

## 2018-05-31 DIAGNOSIS — R404 Transient alteration of awareness: Secondary | ICD-10-CM | POA: Diagnosis not present

## 2018-05-31 DIAGNOSIS — M19071 Primary osteoarthritis, right ankle and foot: Secondary | ICD-10-CM | POA: Diagnosis not present

## 2018-05-31 DIAGNOSIS — Z993 Dependence on wheelchair: Secondary | ICD-10-CM

## 2018-05-31 DIAGNOSIS — N183 Chronic kidney disease, stage 3 unspecified: Secondary | ICD-10-CM

## 2018-05-31 DIAGNOSIS — G9340 Encephalopathy, unspecified: Secondary | ICD-10-CM | POA: Diagnosis present

## 2018-05-31 DIAGNOSIS — E860 Dehydration: Secondary | ICD-10-CM | POA: Diagnosis present

## 2018-05-31 DIAGNOSIS — Z87440 Personal history of urinary (tract) infections: Secondary | ICD-10-CM | POA: Diagnosis not present

## 2018-05-31 DIAGNOSIS — M6281 Muscle weakness (generalized): Secondary | ICD-10-CM | POA: Diagnosis not present

## 2018-05-31 DIAGNOSIS — K219 Gastro-esophageal reflux disease without esophagitis: Secondary | ICD-10-CM | POA: Diagnosis present

## 2018-05-31 DIAGNOSIS — E119 Type 2 diabetes mellitus without complications: Secondary | ICD-10-CM | POA: Diagnosis not present

## 2018-05-31 DIAGNOSIS — L03116 Cellulitis of left lower limb: Principal | ICD-10-CM | POA: Diagnosis present

## 2018-05-31 DIAGNOSIS — G40909 Epilepsy, unspecified, not intractable, without status epilepticus: Secondary | ICD-10-CM | POA: Diagnosis not present

## 2018-05-31 DIAGNOSIS — R000 Tachycardia, unspecified: Secondary | ICD-10-CM | POA: Diagnosis not present

## 2018-05-31 DIAGNOSIS — I878 Other specified disorders of veins: Secondary | ICD-10-CM | POA: Diagnosis present

## 2018-05-31 DIAGNOSIS — Z8603 Personal history of neoplasm of uncertain behavior: Secondary | ICD-10-CM | POA: Diagnosis not present

## 2018-05-31 DIAGNOSIS — R0902 Hypoxemia: Secondary | ICD-10-CM | POA: Diagnosis not present

## 2018-05-31 DIAGNOSIS — M2550 Pain in unspecified joint: Secondary | ICD-10-CM | POA: Diagnosis not present

## 2018-05-31 DIAGNOSIS — E669 Obesity, unspecified: Secondary | ICD-10-CM | POA: Diagnosis not present

## 2018-05-31 DIAGNOSIS — I8393 Asymptomatic varicose veins of bilateral lower extremities: Secondary | ICD-10-CM | POA: Diagnosis present

## 2018-05-31 DIAGNOSIS — R41841 Cognitive communication deficit: Secondary | ICD-10-CM | POA: Diagnosis not present

## 2018-05-31 DIAGNOSIS — Z7401 Bed confinement status: Secondary | ICD-10-CM | POA: Diagnosis not present

## 2018-05-31 HISTORY — DX: Urinary tract infection, site not specified: N39.0

## 2018-05-31 LAB — CBC
HEMATOCRIT: 35.6 % — AB (ref 36.0–46.0)
HEMOGLOBIN: 11.2 g/dL — AB (ref 12.0–15.0)
MCH: 29.1 pg (ref 26.0–34.0)
MCHC: 31.5 g/dL (ref 30.0–36.0)
MCV: 92.5 fL (ref 80.0–100.0)
Platelets: 391 10*3/uL (ref 150–400)
RBC: 3.85 MIL/uL — ABNORMAL LOW (ref 3.87–5.11)
RDW: 15.4 % (ref 11.5–15.5)
WBC: 15.2 10*3/uL — ABNORMAL HIGH (ref 4.0–10.5)
nRBC: 0 % (ref 0.0–0.2)

## 2018-05-31 LAB — URINALYSIS, ROUTINE W REFLEX MICROSCOPIC
BACTERIA UA: NONE SEEN
Bilirubin Urine: NEGATIVE
GLUCOSE, UA: NEGATIVE mg/dL
KETONES UR: NEGATIVE mg/dL
Leukocytes, UA: NEGATIVE
NITRITE: NEGATIVE
PROTEIN: NEGATIVE mg/dL
Specific Gravity, Urine: 1.01 (ref 1.005–1.030)
pH: 6 (ref 5.0–8.0)

## 2018-05-31 LAB — COMPREHENSIVE METABOLIC PANEL
ALK PHOS: 62 U/L (ref 38–126)
ALT: 17 U/L (ref 0–44)
AST: 34 U/L (ref 15–41)
Albumin: 3.4 g/dL — ABNORMAL LOW (ref 3.5–5.0)
Anion gap: 10 (ref 5–15)
BUN: 26 mg/dL — AB (ref 8–23)
CHLORIDE: 103 mmol/L (ref 98–111)
CO2: 27 mmol/L (ref 22–32)
CREATININE: 1.59 mg/dL — AB (ref 0.44–1.00)
Calcium: 9 mg/dL (ref 8.9–10.3)
GFR calc Af Amer: 38 mL/min — ABNORMAL LOW (ref >60)
GFR, EST NON AFRICAN AMERICAN: 33 mL/min — AB (ref >60)
GLUCOSE: 125 mg/dL — AB (ref 70–99)
POTASSIUM: 3.3 mmol/L — AB (ref 3.5–5.1)
Sodium: 140 mmol/L (ref 135–145)
Total Bilirubin: 0.6 mg/dL (ref 0.3–1.2)
Total Protein: 9.1 g/dL — ABNORMAL HIGH (ref 6.5–8.1)

## 2018-05-31 LAB — BLOOD GAS, VENOUS
ACID-BASE EXCESS: 3.5 mmol/L — AB (ref 0.0–2.0)
Bicarbonate: 28.6 mmol/L — ABNORMAL HIGH (ref 20.0–28.0)
O2 SAT: 28.2 %
PCO2 VEN: 48.4 mmHg (ref 44.0–60.0)
Patient temperature: 98.6
pH, Ven: 7.389 (ref 7.250–7.430)

## 2018-05-31 LAB — CBG MONITORING, ED: GLUCOSE-CAPILLARY: 137 mg/dL — AB (ref 70–99)

## 2018-05-31 LAB — I-STAT CG4 LACTIC ACID, ED: LACTIC ACID, VENOUS: 1.63 mmol/L (ref 0.5–1.9)

## 2018-05-31 MED ORDER — SODIUM CHLORIDE 0.9 % IV SOLN
1000.0000 mL | INTRAVENOUS | Status: DC
  Administered 2018-05-31: 1000 mL via INTRAVENOUS

## 2018-05-31 MED ORDER — VANCOMYCIN HCL IN DEXTROSE 1-5 GM/200ML-% IV SOLN: 1000.0000 mg | Freq: Once | INTRAVENOUS | Status: DC

## 2018-05-31 MED ORDER — LISINOPRIL 40 MG PO TABS: 40.0000 mg | ORAL_TABLET | Freq: Every day | ORAL | Status: DC

## 2018-05-31 MED ORDER — SODIUM CHLORIDE 0.9 % IV SOLN
2.0000 g | Freq: Once | INTRAVENOUS | Status: AC
  Administered 2018-05-31: 2 g via INTRAVENOUS
  Filled 2018-05-31: qty 2

## 2018-05-31 MED ORDER — POLYETHYLENE GLYCOL 3350 17 G PO PACK: 17.0000 g | PACK | Freq: Every day | ORAL | Status: DC | PRN

## 2018-05-31 MED ORDER — ENOXAPARIN SODIUM 40 MG/0.4ML ~~LOC~~ SOLN
40.0000 mg | SUBCUTANEOUS | Status: DC
  Administered 2018-06-01: 40 mg via SUBCUTANEOUS
  Filled 2018-05-31: qty 0.4

## 2018-05-31 MED ORDER — INSULIN ASPART 100 UNIT/ML ~~LOC~~ SOLN
0.0000 [IU] | Freq: Three times a day (TID) | SUBCUTANEOUS | Status: DC
  Administered 2018-06-02 – 2018-06-03 (×3): 3 [IU] via SUBCUTANEOUS
  Administered 2018-06-04 (×3): 2 [IU] via SUBCUTANEOUS
  Administered 2018-06-05: 3 [IU] via SUBCUTANEOUS
  Administered 2018-06-05 (×2): 2 [IU] via SUBCUTANEOUS

## 2018-05-31 MED ORDER — ACETAMINOPHEN 325 MG PO TABS
650.0000 mg | ORAL_TABLET | Freq: Four times a day (QID) | ORAL | Status: DC | PRN
  Administered 2018-06-04: 650 mg via ORAL
  Filled 2018-05-31: qty 2

## 2018-05-31 MED ORDER — LISINOPRIL 40 MG PO TABS
40.0000 mg | ORAL_TABLET | Freq: Every day | ORAL | Status: DC
  Administered 2018-06-01 – 2018-06-05 (×6): 40 mg via ORAL
  Filled 2018-05-31 (×6): qty 1

## 2018-05-31 MED ORDER — INSULIN ASPART 100 UNIT/ML ~~LOC~~ SOLN: 0.0000 [IU] | Freq: Every day | SUBCUTANEOUS | Status: DC

## 2018-05-31 MED ORDER — VANCOMYCIN HCL 10 G IV SOLR
2000.0000 mg | Freq: Once | INTRAVENOUS | Status: AC
  Administered 2018-05-31: 2000 mg via INTRAVENOUS
  Filled 2018-05-31: qty 2000

## 2018-05-31 MED ORDER — SODIUM CHLORIDE 0.9 % IV BOLUS
1000.0000 mL | Freq: Once | INTRAVENOUS | Status: AC
  Administered 2018-05-31: 1000 mL via INTRAVENOUS

## 2018-05-31 MED ORDER — FLUTICASONE FUROATE-VILANTEROL 100-25 MCG/INH IN AEPB
1.0000 | INHALATION_SPRAY | Freq: Every day | RESPIRATORY_TRACT | Status: DC
  Administered 2018-06-01 – 2018-06-05 (×5): 1 via RESPIRATORY_TRACT
  Filled 2018-05-31: qty 28

## 2018-05-31 MED ORDER — SODIUM CHLORIDE 0.9 % IV SOLN
INTRAVENOUS | Status: DC
  Administered 2018-06-01: via INTRAVENOUS

## 2018-05-31 MED ORDER — AMLODIPINE BESYLATE 10 MG PO TABS
10.0000 mg | ORAL_TABLET | Freq: Every day | ORAL | Status: DC
  Administered 2018-06-01 – 2018-06-05 (×5): 10 mg via ORAL
  Filled 2018-05-31 (×5): qty 1

## 2018-05-31 MED ORDER — LEVETIRACETAM 500 MG PO TABS
500.0000 mg | ORAL_TABLET | Freq: Two times a day (BID) | ORAL | Status: DC
  Administered 2018-06-01 – 2018-06-05 (×10): 500 mg via ORAL
  Filled 2018-05-31 (×10): qty 1

## 2018-05-31 MED ORDER — SODIUM CHLORIDE 0.9% FLUSH
3.0000 mL | Freq: Two times a day (BID) | INTRAVENOUS | Status: DC
  Administered 2018-06-01 – 2018-06-05 (×8): 3 mL via INTRAVENOUS

## 2018-05-31 MED ORDER — METRONIDAZOLE IN NACL 5-0.79 MG/ML-% IV SOLN
500.0000 mg | Freq: Three times a day (TID) | INTRAVENOUS | Status: DC
  Administered 2018-05-31: 500 mg via INTRAVENOUS
  Filled 2018-05-31 (×2): qty 100

## 2018-05-31 MED ORDER — HYDROCODONE-ACETAMINOPHEN 5-325 MG PO TABS: 1.0000 | ORAL_TABLET | ORAL | Status: DC | PRN

## 2018-05-31 MED ORDER — ALBUTEROL SULFATE (2.5 MG/3ML) 0.083% IN NEBU: 3.0000 mL | INHALATION_SOLUTION | Freq: Four times a day (QID) | RESPIRATORY_TRACT | Status: DC | PRN

## 2018-05-31 MED ORDER — ACETAMINOPHEN 650 MG RE SUPP: 650.0000 mg | Freq: Four times a day (QID) | RECTAL | Status: DC | PRN

## 2018-05-31 NOTE — ED Triage Notes (Signed)
Per [facility]- Pt resides at home. Full Code. Unwitnessed fall. Husband found wife learning over a chair on her knees. Alert and oriented to event/ Pt states she tripped and fell ( mechanical fall) . Pt states about an hour. No trauma noted. During transport pt presents with increased confusion and lethargy. Assessed strong urine odor. Family gives HX of re occurrent UTI. Assessed  Weeping yellow drainage from multiple blisters on BLE. Odor also noted per EMS.

## 2018-05-31 NOTE — Progress Notes (Signed)
A consult was received from an ED physician for vancomycin and cefepime per pharmacy dosing.  The patient's profile has been reviewed for ht/wt/allergies/indication/available labs.    A one time order has been placed for vancomycin 2000 mg IV x1 and cefepime 2gm IV x1 Further antibiotics/pharmacy consults should be ordered by admitting physician if indicated.                       Thank you, Lynelle Doctor 05/31/2018  3:03 PM

## 2018-05-31 NOTE — ED Provider Notes (Signed)
Discussed with the senior resident on internal medicine teaching service.  Will admit to [facility] under telemetry.  Attending Dr Damita Dunnings Achille Rich, MD 05/31/18 1759

## 2018-05-31 NOTE — ED Provider Notes (Signed)
[facility] DEPT Provider Note   CSN: [id number] Arrival date & time: 05/31/18  1239     History   Chief Complaint Chief Complaint  Patient presents with  . Fall  . Altered Mental Status  . Recurrent UTI  . Wound Infection  . Urinary Frequency    HPI Mariah [name] is a 67 y.o. female.  Patient is a 67 year old female with a history of diabetes, chronic lower extremity edema and lymphedema, hypertension, asthma who is presenting today by EMS after being found unresponsive in her home.  Patient has someone in her building who checks on her regularly as she has no children or family that lives with her.  Today they found her slumped over in a wheelchair extremely somnolent.  Patient's aunt did initially come with the patient and gave history to the nurse.  Patient has had several days of hallucinating.  She is talking about people who have been dead for quite some time as well as a animal that she cared for that died years ago.  She states she is just not been herself.  Her aunt is extremely concerned about her living situation and does not feel that she is safe to live on her own.  Patient has had ongoing issues with her lower extremities including wounds, weeping and swelling.  Patient is supposed to take Lasix for this but based on recent notes from her PCP she has run out of Lasix but has not had follow-up to get it refilled.  Patient is extremely sleepy on exam and does not give any history.  Unclear if she has had fever, urinary symptoms, nausea, vomiting, cough or shortness of breath.  Based on medication list patient does not take narcotic pain medication or other mind altering medications.  Blood sugar was 137 when checked upon arrival.  The history is provided by the patient. The history is limited by the condition of the patient.    Past Medical History:  Diagnosis Date  . Allergic rhinitis   . Arthritis    "ankles, feet, knees" (10/30/2017)  .  Asthma   . Bursitis   . Carpal tunnel syndrome   . GERD (gastroesophageal reflux disease)   . HTN (hypertension)   . Migraine    "a couple/yr" (10/30/2017)  . Obesity   . Peripheral vascular disease ([place])   . Pneumonia 1960s X 1  . Sciatica   . Tendinitis   . Type II diabetes mellitus (HCC)    Type II  . UTI (urinary tract infection)     Patient Active Problem List   Diagnosis Date Noted  . Bilateral leg swelling 03/06/2018  . 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 Hypoglycemia 11/07/2017  . 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 Vaginal bleeding 11/06/2017  . Acute encephalopathy 10/30/2017  . Postmenopausal bleeding 09/01/2017  . Screening for colon cancer 09/01/2017  . Thyroid nodule 09/01/2017  . Daytime sleepiness 08/15/2017  . Bilateral knee pain 08/15/2017  . Fall 08/15/2017  . Screening for breast cancer 05/25/2017  . NAFLD (nonalcoholic fatty liver disease) 05/24/2017  . Morbid obesity ([place]) 10/21/2012  . Type 2 diabetes mellitus treated without insulin ([place]) 09/18/2012  . FIBROIDS, UTERUS 07/10/2008  . Chronic venous stasis dermatitis of both lower extremities 07/10/2008  . Hypertension associated with diabetes ([place]) 04/26/2007  . Allergic rhinitis 04/26/2007  . Asthma 04/26/2007    Past Surgical History:  Procedure Laterality Date  . CRANIOTOMY N/A  01/19/2018   Procedure: ENDOSCOPIC TRANSPHENOIDAL RESECTION OF PITUITARY TUMOR;  Surgeon: Consuella Lose, MD;  Location: [facility];  Service: Neurosurgery;  Laterality: N/A;  ENDOSCOPIC TRANSPHENOIDAL RESECTION OF PITUITARY TUMOR  . GANGLION CYST EXCISION Left   . KNEE ARTHROSCOPY Left 2003  . PLANTAR FASCIA RELEASE Right 1995   "heel"  . SINUS ENDO WITH FUSION N/A 01/19/2018   Procedure: SINUS ENDO WITH FUSION;  Surgeon: Jerrell Belfast, MD;  Location: [facility];  Service: ENT;  Laterality: N/A;  . TONSILLECTOMY  1958  . TRANSPHENOIDAL APPROACH EXPOSURE N/A 01/19/2018    Procedure: TRANSPHENOIDAL  RESECTION OF PITUITARY TUMOR;  Surgeon: Jerrell Belfast, MD;  Location: [facility];  Service: ENT;  Laterality: N/A;  . [place]; 1997   Dr [name]     OB History   None      Home Medications    Prior to Admission medications   Medication Sig Start Date End Date Taking? Authorizing Provider  albuterol (PROVENTIL HFA;VENTOLIN HFA) 108 (90 Base) MCG/ACT inhaler Inhale 2 puffs into the lungs every 6 (six) hours as needed for wheezing or shortness of breath. 01/30/18   [name], MD  amLODipine (NORVASC) 10 MG tablet TAKE ONE TABLET BY MOUTH EVERY DAY 01/02/18   [name], MD  amLODipine (NORVASC) 10 MG tablet Take 1 tablet (10 mg total) by mouth daily. PATIENT MUST BE SEEN FOR ADDITIONAL REFILLS 05/26/18   [name], MD  AQUALANCE LANCETS 30G MISC USE AS DIRECTED TWICE DAILY 11/17/17   [name], MD  esomeprazole (NEXIUM) 20 MG capsule Take 1 capsule (20 mg total) by mouth every morning. For acid reflex 11/16/17   Medina-Vargas, Monina C, NP  Fluticasone-Salmeterol (ADVAIR) 100-50 MCG/DOSE AEPB Inhale 1 puff into the lungs 2 (two) times daily. 04/27/18   [name], MD  furosemide (LASIX) 40 MG tablet TAKE ONE TABLET BY MOUTH EVERY DAY 02/27/18   Axel Filler, MD  glipiZIDE (GLUCOTROL XL) 10 MG 24 hr tablet TAKE TWO TABLETS BY MOUTH EVERY DAY 01/30/18   [name], MD  glipiZIDE (GLUCOTROL XL) 10 MG 24 hr tablet Take 2 tablets (20 mg total) by mouth daily. PATIENT MUST BE SEEN FOR ADDITIONAL REFILLS 05/26/18   [name], MD  HYDROcodone-acetaminophen (NORCO/VICODIN) 5-325 MG tablet Take 1 tablet by mouth every 4 (four) hours as needed for moderate pain. 01/23/18   Costella, Vincent J, PA-C  JANUMET 50-1000 MG tablet TAKE ONE TABLET BY MOUTH TWICE DAILY WITH MEALS 04/27/18   [name], MD  levETIRAcetam (KEPPRA) 500 MG tablet Take 1 tablet (500 mg total) by mouth 2 (two) times daily. 05/26/18   [name], MD  levofloxacin  (LEVAQUIN) 500 MG tablet Take 1 tablet (500 mg total) by mouth daily. 1 tab po qd 01/19/18   Jerrell Belfast, MD  lisinopril (PRINIVIL,ZESTRIL) 40 MG tablet Take 40 mg by mouth daily. 12/04/17   [provider]  losartan (COZAAR) 100 MG tablet Take 1 tablet (100 mg total) by mouth daily. Patient not taking: Reported on 12/13/2017 11/16/17   Medina-Vargas, Monina C, NP  mometasone (NASONEX) 50 MCG/ACT nasal spray Place 1 spray into the nose as needed. Patient taking differently: Place 1 spray into the nose as needed (allergies).  12/12/17   [name], MD  naproxen (NAPROSYN) 500 MG tablet Take 1 tablet (500 mg total) by mouth 2 (two) times daily with a meal. Patient taking differently: Take 500 mg by mouth 2 (two) times daily as needed for moderate  pain.  08/15/17 08/15/18  Alphonzo Grieve, MD  TRUE METRIX BLOOD GLUCOSE TEST test strip USE AS DIRECTED TWICE DAILY 11/17/17   [name], MD    Family History Family History  Problem Relation Age of Onset  . Uterine cancer Mother   . Hypertension Mother   . Heart disease Mother   . Hypertension Father   . Alzheimer's disease Father   . Cancer Other     Social History Social History   Tobacco Use  . Smoking status: Never Smoker  . Smokeless tobacco: Never Used  Substance Use Topics  . Alcohol use: No  . Drug use: No     Allergies   Penicillins; Codeine; and Indomethacin   Review of Systems Review of Systems  All other systems reviewed and are negative.    Physical Exam Updated Vital Signs BP (!) 157/81   Pulse 95   Temp 98.4 F (36.9 C) (Oral)   Resp (!) 22   LMP 12/19/2017   SpO2 100%   Physical Exam  Constitutional: She appears well-developed and well-nourished. No distress.  Will arouse to voice when said loudly.  morbidly obese  HENT:  Head: Normocephalic and atraumatic.  Mouth/Throat: Oropharynx is clear and moist.  Eyes: Pupils are equal, round, and reactive to light. Conjunctivae and EOM are  normal.  53mm and reactive bilaterally  Neck: Normal range of motion. Neck supple.  Cardiovascular: Normal rate, regular rhythm and intact distal pulses.  Murmur heard. Pulmonary/Chest: Effort normal and breath sounds normal. Tachypnea noted. No respiratory distress. She has no wheezes. She has no rales.  Abdominal: Soft. She exhibits no distension. There is no tenderness. There is no rebound and no guarding.  Musculoskeletal: Normal range of motion. She exhibits edema and tenderness.  Bilateral legs are tender to palpation.  Skin changes consistent with chronic venous stasis and lymphedema.  Wound present on the left lower lateral leg with mild yellow discharge and some crusting.  No significant erythema.  Neurological:  Somnolent but does move all ext.  Will respond to voice but not following commands  Skin: Skin is warm and dry. No rash noted. No erythema.  Nursing note and vitals reviewed.    ED Treatments / Results  Labs (all labs ordered are listed, but only abnormal results are displayed) Labs Reviewed  COMPREHENSIVE METABOLIC PANEL - Abnormal; Notable for the following components:      Result Value   Potassium 3.3 (*)    Glucose, Bld 125 (*)    BUN 26 (*)    Creatinine, Ser 1.59 (*)    Total Protein 9.1 (*)    Albumin 3.4 (*)    GFR calc non Af Amer 33 (*)    GFR calc Af Amer 38 (*)    All other components within normal limits  CBC - Abnormal; Notable for the following components:   WBC 15.2 (*)    RBC 3.85 (*)    Hemoglobin 11.2 (*)    HCT 35.6 (*)    All other components within normal limits  CBG MONITORING, ED - Abnormal; Notable for the following components:   Glucose-Capillary 137 (*)    All other components within normal limits  CULTURE, BLOOD (ROUTINE X 2)  CULTURE, BLOOD (ROUTINE X 2)  URINE CULTURE  URINALYSIS, ROUTINE W REFLEX MICROSCOPIC  BLOOD GAS, VENOUS  CBG MONITORING, ED  I-STAT CG4 LACTIC ACID, ED    EKG EKG  Interpretation  Date/Time:  Thursday May 31 2018 13:01:36 EST Ventricular Rate:  98 PR Interval:    QRS Duration: 76 QT Interval:  342 QTC Calculation: 437 R Axis:   12 Text Interpretation:  Sinus rhythm Borderline T wave abnormalities No significant change since last tracing Confirmed by Blanchie Dessert [phone]) on 05/31/2018 2:43:42 PM   Radiology No results found.  Procedures Procedures (including critical care time)  Medications Ordered in ED Medications  0.9 %  sodium chloride infusion (has no administration in time range)     Initial Impression / Assessment and Plan / ED Course  I have reviewed the triage vital signs and the nursing notes.  Pertinent labs & imaging results that were available during my care of the patient were reviewed by me and considered in my medical decision making (see chart for details).     67 year old female with multiple medical problems presenting today with altered mental status, history of delirium over the last few days and fever with symptoms concerning for sepsis.  Patient has a temperature of 100 rectally.  She was sitting in her own feces.  She also has chronic lower extremity edema with some crusting and drainage of the left lower extremity.  Patient has no abdominal pain on exam but does have a soft reducible ventral hernia.  Patient does have a history of excessive daytime sleepiness however patient will barely respond for only a short period to voice.  Concern for possible hypercarbia versus symptoms related to sepsis versus possible stroke or intracranial injury.  Also concern for UTI.  Patient is febrile to 100 but no obvious signs of cellulitis in the lower extremities.  Patient cannot give a full history but breath sounds are clear bilaterally.  Patient has a leukocytosis of 15,000 today, AK I with a creatinine of 1.5 to an elevated BUN.  Concern for dehydration and patient was given 1 L of IV fluids.  Lactic acid within normal  limits.  Head CT, chest x-ray, UA, VBG pending.  Will cover with antibiotics.  Suspect the patient will need admission for ongoing care.  Final Clinical Impressions(s) / ED Diagnoses   Final diagnoses:  None    ED Discharge Orders    None       Blanchie Dessert, MD 05/31/18 2104

## 2018-05-31 NOTE — ED Notes (Signed)
X-ray at bedside

## 2018-05-31 NOTE — ED Notes (Signed)
AUNT AT [facility]. PT HAS BEEN TALKING ABOUT HER MOTHER (WHO HAS BEEN DEAD FOR YEARS) AND TELLING OTHERS TO TAKE HER DOG OUT (WHO ALSO HAS BEEN DEAD FOR YEARS). PT LIVES BY HERSELF. A PERSON AT HER BUILDING WILL STOP IN AND CHECK IN ON HER. NO AIDE COMES IN AND ASSIST WITH HER CARE. NO FAMILY TO ASSIST WITH HER CARE.   PT HAS BEEN CONFUSED FOR SEVERAL DAYS. PT DOES NOT HAVE ANY CHILDREN.

## 2018-05-31 NOTE — ED Notes (Signed)
Attempted to call report to [facility]W and was told they would call back.

## 2018-05-31 NOTE — ED Notes (Signed)
BLOOD CULTURE X 1 COLLECTED LEFT AC

## 2018-05-31 NOTE — ED Notes (Signed)
ED Provider at bedside. COOK 

## 2018-05-31 NOTE — ED Notes (Signed)
Mariah [name] (AUNT) [phone]

## 2018-05-31 NOTE — ED Notes (Signed)
BLOOD CULTURE BLUE ONLY 2ND SET OBTAINED LEFT WRIST

## 2018-05-31 NOTE — ED Notes (Signed)
RETURNED FROM CT

## 2018-05-31 NOTE — ED Notes (Signed)
Bed: [facility] Expected date:  Expected time:  Means of arrival:  Comments: EMS-fall 

## 2018-05-31 NOTE — ED Notes (Signed)
ED TO INPATIENT HANDOFF REPORT  Name/Age/Gender Mariah [name] 67 y.o. female  Code Status Code Status History    Date Active Date Inactive Code Status Order ID Comments User Context   01/19/2018 1121 01/23/2018 1905 Full Code [id number]  Elpidio Eric Inpatient   10/30/2017 1928 11/02/2017 1753 Full Code [id number]  Ledell Noss, MD ED   10/21/2012 0210 10/23/2012 1702 Full Code [id number]  Toy Baker, MD Inpatient      Home/SNF/Other Home  Chief Complaint Fall  Level of Care/Admitting Diagnosis ED Disposition    ED Disposition Condition [place]: [facility] [[id number]]  Level of Care: Telemetry [5]  Diagnosis: Altered mental state [phone]  Admitting Physician: Axel Filler [[id number]]  Attending Physician: Axel Filler [[id number]]  Estimated length of stay: 3 - 4 days  Certification:: I certify this patient will need inpatient services for at least 2 midnights  PT Class (Do Not Modify): Inpatient [101]  PT Acc Code (Do Not Modify): Private [1]       Medical History Past Medical History:  Diagnosis Date  . Allergic rhinitis   . Arthritis    "ankles, feet, knees" (10/30/2017)  . Asthma   . Bursitis   . Carpal tunnel syndrome   . GERD (gastroesophageal reflux disease)   . HTN (hypertension)   . Migraine    "a couple/yr" (10/30/2017)  . Obesity   . Peripheral vascular disease ([place])   . Pneumonia 1960s X 1  . Sciatica   . Tendinitis   . Type II diabetes mellitus (HCC)    Type II  . UTI (urinary tract infection)     Allergies Allergies  Allergen Reactions  . Penicillins Hives and Other (See Comments)    ++ got keflex in 2013 and 2019++ Has patient had a PCN reaction causing immediate rash, facial/tongue/throat swelling, SOB or lightheadedness with hypotension: No Has patient had a PCN reaction causing severe rash involving mucus membranes or skin necrosis: No PATIENT HAS HAD A PCN REACTION THAT  REQUIRED HOSPITALIZATION:  #  #  YES  #  #  Has patient had a PCN reaction occurring within the last 10 years: No   . Codeine Nausea And Vomiting  . Indomethacin Diarrhea    IV Location/Drains/Wounds Patient Lines/Drains/Airways Status   Active Line/Drains/Airways    Name:   Placement date:   Placement time:   Site:   Days:   Peripheral IV 04/25/18 Right Antecubital   04/25/18    0642    Antecubital   36   Peripheral IV 05/31/18 Left Forearm   05/31/18    1300    Forearm   less than 1   Peripheral IV 05/31/18 Left Antecubital   05/31/18    1413    Antecubital   less than 1   External Urinary Catheter   01/20/18    2200    -   131   External Urinary Catheter   05/31/18    1600    -   less than 1   Incision (Closed) 01/19/18 Abdomen   01/19/18    1100     132   Incision (Closed) 01/19/18 Nose   01/19/18    1100     132          Labs/Imaging Results for orders placed or performed during the hospital encounter of 05/31/18 (from the past 48 hour(s))  CBG monitoring, ED     Status:  Abnormal   Collection Time: 05/31/18  1:09 PM  Result Value Ref Range   Glucose-Capillary 137 (H) 70 - 99 mg/dL  Comprehensive metabolic panel     Status: Abnormal   Collection Time: 05/31/18  2:01 PM  Result Value Ref Range   Sodium 140 135 - 145 mmol/L   Potassium 3.3 (L) 3.5 - 5.1 mmol/L   Chloride 103 98 - 111 mmol/L   CO2 27 22 - 32 mmol/L   Glucose, Bld 125 (H) 70 - 99 mg/dL   BUN 26 (H) 8 - 23 mg/dL   Creatinine, Ser 1.59 (H) 0.44 - 1.00 mg/dL   Calcium 9.0 8.9 - 10.3 mg/dL   Total Protein 9.1 (H) 6.5 - 8.1 g/dL   Albumin 3.4 (L) 3.5 - 5.0 g/dL   AST 34 15 - 41 U/L   ALT 17 0 - 44 U/L   Alkaline Phosphatase 62 38 - 126 U/L   Total Bilirubin 0.6 0.3 - 1.2 mg/dL   GFR calc non Af Amer 33 (L) >60 mL/min   GFR calc Af Amer 38 (L) >60 mL/min    Comment: (NOTE) The eGFR has been calculated using the CKD EPI equation. This calculation has not been validated in all clinical situations. eGFR's  persistently <60 mL/min signify possible Chronic Kidney Disease.    Anion gap 10 5 - 15    Comment: Performed at [facility], [place] [address], [place], [place] [address]  CBC     Status: Abnormal   Collection Time: 05/31/18  2:01 PM  Result Value Ref Range   WBC 15.2 (H) 4.0 - 10.5 K/uL   RBC 3.85 (L) 3.87 - 5.11 MIL/uL   Hemoglobin 11.2 (L) 12.0 - 15.0 g/dL   HCT 35.6 (L) 36.0 - 46.0 %   MCV 92.5 80.0 - 100.0 fL   MCH 29.1 26.0 - 34.0 pg   MCHC 31.5 30.0 - 36.0 g/dL   RDW 15.4 11.5 - 15.5 %   Platelets 391 150 - 400 K/uL   nRBC 0.0 0.0 - 0.2 %    Comment: Performed at [facility], [place] [address] [place], [place] [address]  I-Stat CG4 Lactic Acid, ED     Status: None   Collection Time: 05/31/18  2:04 PM  Result Value Ref Range   Lactic Acid, Venous 1.63 0.5 - 1.9 mmol/L  Blood gas, venous     Status: Abnormal   Collection Time: 05/31/18  3:10 PM  Result Value Ref Range   pH, Ven 7.389 7.250 - 7.430   pCO2, Ven 48.4 44.0 - 60.0 mmHg   pO2, Ven BELOW REPORTABLE RANGR 32.0 - 45.0 mmHg    Comment: RBV DR Pasty Spillers BY LISA CRADDOCK,RRT,RCP ON 05/31/18 AT 1516   Bicarbonate 28.6 (H) 20.0 - 28.0 mmol/L   Acid-Base Excess 3.5 (H) 0.0 - 2.0 mmol/L   O2 Saturation 28.2 %   Patient temperature 98.6    Collection site VEIN    Drawn by DRAWN BY RN    Sample type VEIN     Comment: Performed at [facility], [place] [address], [place], [place] [address]  Urinalysis, Routine w reflex microscopic- may I&O cath if menses     Status: Abnormal   Collection Time: 05/31/18  3:22 PM  Result Value Ref Range   Color, Urine YELLOW YELLOW   APPearance CLEAR CLEAR   Specific Gravity, Urine 1.010 1.005 - 1.030   pH 6.0 5.0 - 8.0  Glucose, UA NEGATIVE NEGATIVE mg/dL   Hgb urine dipstick SMALL (A) NEGATIVE   Bilirubin Urine NEGATIVE NEGATIVE   Ketones, ur NEGATIVE NEGATIVE mg/dL   Protein, ur NEGATIVE NEGATIVE mg/dL   Nitrite NEGATIVE  NEGATIVE   Leukocytes, UA NEGATIVE NEGATIVE   RBC / HPF 0-5 0 - 5 RBC/hpf   WBC, UA 0-5 0 - 5 WBC/hpf   Bacteria, UA NONE SEEN NONE SEEN   Squamous Epithelial / LPF 0-5 0 - 5   Mucus PRESENT    Hyaline Casts, UA PRESENT     Comment: Performed at [facility], [place] [address] [place], [place] [address]   Ct Head Wo Contrast  Result Date: 05/31/2018 CLINICAL DATA:  Altered level of consciousness.  Unwitnessed fall. EXAM: CT HEAD WITHOUT CONTRAST TECHNIQUE: Contiguous axial images were obtained from the base of the skull through the vertex without intravenous contrast. COMPARISON:  CT scan of October 30, 2017.  MRI of November 01, 2017. FINDINGS: Brain: Mild chronic ischemic white matter disease is noted. No mass effect or midline shift is noted. Ventricular size is within normal limits. There is no evidence of hemorrhage or infarction. 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 Vascular: No hyperdense vessel or unexpected calcification. Skull: Normal. Negative for fracture or focal lesion. Sinuses/Orbits: Left frontal and ethmoid sinusitis is noted. Other: None. IMPRESSION: Mild chronic ischemic white matter disease. 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 Left frontal and ethmoid sinusitis. No other significant intracranial abnormality seen. Electronically Signed   By: Marijo Conception, M.D.   On: 05/31/2018 16:12   Dg Chest Port 1 View  Result Date: 05/31/2018 CLINICAL DATA:  Unwitnessed fall. Husband found wife learning over a chair on her knees. Alert and oriented to event/ Pt states she tripped and fell ( mechanical fall) . Pt states about an hour. No trauma noted. During transport pt presents with increased confusion and lethargy. EXAM: PORTABLE CHEST 1 VIEW COMPARISON:  10/30/2017 FINDINGS: Cardiac silhouette is normal in size. No mediastinal or hilar masses. No  evidence of adenopathy. Clear lungs.  No pleural effusion or pneumothorax. Skeletal structures are grossly intact. IMPRESSION: No active disease. Electronically Signed   By: Lajean Manes M.D.   On: 05/31/2018 15:17   EKG Interpretation  Date/Time:  Thursday May 31 2018 13:01:36 EST Ventricular Rate:  98 PR Interval:    QRS Duration: 76 QT Interval:  342 QTC Calculation: 437 R Axis:   12 Text Interpretation:  Sinus rhythm Borderline T wave abnormalities No significant change since last tracing Confirmed by Blanchie Dessert [phone]) on 05/31/2018 2:43:42 PM Also confirmed by Blanchie Dessert [phone]), editor Lynder Parents [phone])  on 05/31/2018 2:56:11 PM   Pending Labs Unresulted Labs (From admission, onward)    Start     Ordered   05/31/18 1430  Blood Culture (routine x 2)  BLOOD CULTURE X 2,   STAT    Question:  Patient immune status  Answer:  Normal   05/31/18 1430   05/31/18 1430  Urine culture  STAT,   STAT    Question:  Patient immune status  Answer:  Normal   05/31/18 1430          Vitals/Pain Today's Vitals   05/31/18 1630 05/31/18 1700 05/31/18 1715 05/31/18 1800  BP: (!) 154/75 (!) 158/79  (!) 146/72  Pulse: 91 84 88 88  Resp: [name])  24 (!) 23 (!) 21 (!) 25  Temp:      TempSrc:      SpO2: 100% 95% 93% 95%  Weight:      Height:      PainSc:        Isolation Precautions No active isolations  Medications Medications  0.9 %  sodium chloride infusion (1,000 mLs Intravenous New Bag/Given 05/31/18 1634)  metroNIDAZOLE (FLAGYL) IVPB 500 mg (500 mg Intravenous New Bag/Given 05/31/18 1635)  sodium chloride 0.9 % bolus 1,000 mL (1,000 mLs Intravenous Bolus 05/31/18 1517)  ceFEPIme (MAXIPIME) 2 g in sodium chloride 0.9 % 100 mL IVPB (0 g Intravenous Stopped 05/31/18 1549)  vancomycin (VANCOCIN) 2,000 mg in sodium chloride 0.9 % 500 mL IVPB (0 mg Intravenous Stopped 05/31/18 1721)    Mobility manual wheelchair

## 2018-05-31 NOTE — ED Notes (Signed)
ED Provider at bedside. PLUNKETT. PT OPENED EYES AND DID RESPOND TO QUESTION ASKED FOR EDP.

## 2018-05-31 NOTE — H&P (Addendum)
Date: 06/01/2018               Patient Name:  Mariah [name] MRN: [id number]  DOB: 12-25-1950 Age / Sex: 67 y.o., female   PCP: Mariah Nimrod, MD         Medical Service: Internal Medicine Teaching Service         Attending Physician: Dr. Evette [name], Mariah [name], *    First Contact: Dr. Perrin [name] Pager: [phone]  Second Contact: Dr. Frederico [name] Pager: [phone]       After Hours (After 5p/  First Contact Pager: [phone]  weekends / holidays): Second Contact Pager: [phone]   Chief Complaint:  Altered mental status  History of Present Illness: [name] is a 67 year old woman brought to the ED by EMS for altered mental status and decreased functioning at home. She lives alone independently, though she is wheelchair bound, and was found yesterday by her neighbor. She was disheveled and soiled, found on the ground out of her wheelchair consistent with a fall. She is unable to provide any history because of acute encephalopathy. 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 Patient's aunt reported that she has been confused and hallucinating for the past several days as she has been talking about her deceased mother and deceased dog.    Meds:  Amlodipine 10 mg daily Nexium 20 mg daily Lasix 40 mg daily Glipizide 20 mg daily Janumet 11-998 mg twice daily Levaquin 500 mg daily Lisinopril 40 mg daily Mometasone Naproxen 500 mg twice daily Albuterol Advair Norco 5-325 q4h Keppra 500 mg twice daily  Allergies: Allergies as of 05/31/2018 - Review Complete 04/25/2018  Allergen Reaction Noted  . Penicillins Hives and Other (See Comments)   . Codeine Nausea And Vomiting   . Indomethacin Diarrhea 10/14/2014   Past Medical History:  Diagnosis Date  . Allergic rhinitis   . Arthritis    "ankles, feet, knees" (10/30/2017)  . Asthma   . Bursitis   . Carpal tunnel syndrome   . GERD (gastroesophageal reflux disease)   . HTN  (hypertension)   . Migraine    "a couple/yr" (10/30/2017)  . Obesity   . Peripheral vascular disease (Mariah [name])   . Pneumonia 1960s X 1  . Sciatica   . Tendinitis   . Type II diabetes mellitus (HCC)    Type II  . UTI (urinary tract infection)     Family History: Mother with uterine cancer, hypertension, heart disease.  Father with hypertension and Alzheimer's disease.  Social History:  -Lives in [place] alone at home and has a neighbor that checks on her regularly -Uses wheelchair and crutches at baseline -Has no children or family members that support her   Review of Systems:  Unable to obtain due to encephalopathy   Physical Exam: Blood pressure (!) 173/90, pulse 91, temperature 98.8 F (37.1 C), temperature source Oral, resp. rate 17, height 5\' 1"  (1.549 m), weight 94.6 kg, last menstrual period 12/19/2017, SpO2 100 %.   Physical Exam  Constitutional: She is well-developed, well-nourished, and in no distress. No distress.  HENT:  Head: Normocephalic and atraumatic.  Eyes: Conjunctivae are normal.  Neck: Neck supple.  Cardiovascular: Normal rate and regular rhythm. Exam reveals no gallop and no friction rub.  No murmur heard. Pulmonary/Chest: Effort normal and breath sounds normal. No respiratory distress. She has no wheezes. She has no rales.  Abdominal: Soft. Bowel sounds are normal. She exhibits no distension. There  is no tenderness. There is no rebound.  Musculoskeletal: Normal range of motion. She exhibits edema (Bilateral lower extremity), tenderness and deformity.  Neurological: She is alert. No cranial nerve deficit.  Alert and oriented to name only  Skin: Skin is warm. She is not diaphoretic. There is erythema.  -Left lower extremity with chronic venous stasis, wound at multiple sites on the skin and noticeable purulent discharge, malodorous -Skin is warm to touch, no tenderness to palpation  Psychiatric: Memory and affect normal.      Assessment & Plan  by Problem: Principal Problem:   Acute encephalopathy Active Problems:   Chronic venous stasis dermatitis of both lower extremities   Cellulitis of leg, left   Acute Encephalopathy: recurring issue, last hospitalized in April for acute encephalopathy that resolved with supportive care. Was discharged to SNF, now back at home but not doing well on her own. Probably has a cellulitis in the left lower extremity which may be contributing. Will treat this with IV vancomycin. May also be a polypharmacy issue with confusion in taking her own medications. Will continue with supportive care, PT and OT, and need to work with her family about providing more long term support.  Left lower extremity cellulitis: Patient with chronic lower extremity venous stasis and lymphedema now with new multiple open wound with purulent discharge on the left lower extremity.  Skin is warm to touch, erythematous, mildly tender to palpation.  Patient does meet SIRS criteria.  Vital signs with temperature of 100F, tachycardic to 102, tachypneic, CBC with leukocytosis of 15.2. Wound care consultant wonders about risk for squamous cell carcinoma at this site, which is a possibility, but I would wait for skin biopsy until we resolve the purulent cellulitis first. - Vancomycin for now  Hypertension: Continue home amlodipine 10 mg daily -Hold lisinopril  Diabetes mellitus: Continue moderate sliding scale insulin with bedtime correction. -Takes glipizide 20 mg daily, Janumet 11-998 twice daily at home  Seizure disorder: Continue Keppra 500 mg daily  Acute kidney injury: Most likely prerenal secondary to dehydration.  Received 1 L normal saline bolus in the ED - Follow-up a.m. BMP  Postmenopausal bleeding: Per chart review, patient has a history of postmenopausal bleeding and has been referred to OB/GYN but has not followed up.  Also noted is uterine cancer in her mother.  She still reports of ongoing postmenopausal bleeding with  pain. -Will require outpatient follow-up  History of pituitary macroadenoma: Per chart review, patient underwent transsphenoidal receptor of pituitary macroadenoma however CT scan of the head performed yesterday still showed stable sellar and suprasellar mass consistent with macroadenoma.  FEN: Replace electrolytes as needed, modified diet DVT prophylaxis: Subcutaneous Lovenox CODE STATUS: Full code  Dispo: Admit patient to Inpatient with expected length of stay greater than 2 midnights.  Signed: Jean Rosenthal, MD 06/01/2018, 12:16 AM  Pager: [phone] IMTS PGY-1    Internal Medicine Attending:   I saw and examined the patient. I reviewed the resident's note and I agree with the resident's findings and plan as documented in the resident's note. I have made necessary corrections to this note.  Lalla Brothers, MD

## 2018-05-31 NOTE — ED Notes (Signed)
Report given to [place], Therapist, sports.

## 2018-06-01 DIAGNOSIS — G9340 Encephalopathy, unspecified: Secondary | ICD-10-CM

## 2018-06-01 DIAGNOSIS — Z87440 Personal history of urinary (tract) infections: Secondary | ICD-10-CM

## 2018-06-01 DIAGNOSIS — E119 Type 2 diabetes mellitus without complications: Secondary | ICD-10-CM

## 2018-06-01 DIAGNOSIS — Z8603 Personal history of neoplasm of uncertain behavior: Secondary | ICD-10-CM

## 2018-06-01 DIAGNOSIS — Z9089 Acquired absence of other organs: Secondary | ICD-10-CM

## 2018-06-01 DIAGNOSIS — I872 Venous insufficiency (chronic) (peripheral): Secondary | ICD-10-CM

## 2018-06-01 DIAGNOSIS — I10 Essential (primary) hypertension: Secondary | ICD-10-CM

## 2018-06-01 DIAGNOSIS — Z79899 Other long term (current) drug therapy: Secondary | ICD-10-CM

## 2018-06-01 DIAGNOSIS — J45909 Unspecified asthma, uncomplicated: Secondary | ICD-10-CM

## 2018-06-01 DIAGNOSIS — N179 Acute kidney failure, unspecified: Secondary | ICD-10-CM

## 2018-06-01 DIAGNOSIS — L03116 Cellulitis of left lower limb: Principal | ICD-10-CM

## 2018-06-01 DIAGNOSIS — G40909 Epilepsy, unspecified, not intractable, without status epilepticus: Secondary | ICD-10-CM

## 2018-06-01 LAB — CBC WITH DIFFERENTIAL/PLATELET
Abs Immature Granulocytes: 0.06 10*3/uL (ref 0.00–0.07)
Basophils Absolute: 0 10*3/uL (ref 0.0–0.1)
Basophils Relative: 0 %
EOS PCT: 3 %
Eosinophils Absolute: 0.4 10*3/uL (ref 0.0–0.5)
HEMATOCRIT: 37.9 % (ref 36.0–46.0)
HEMOGLOBIN: 12 g/dL (ref 12.0–15.0)
Immature Granulocytes: 1 %
LYMPHS PCT: 14 %
Lymphs Abs: 1.8 10*3/uL (ref 0.7–4.0)
MCH: 28.6 pg (ref 26.0–34.0)
MCHC: 31.7 g/dL (ref 30.0–36.0)
MCV: 90.2 fL (ref 80.0–100.0)
MONO ABS: 1 10*3/uL (ref 0.1–1.0)
MONOS PCT: 8 %
Neutro Abs: 9.6 10*3/uL — ABNORMAL HIGH (ref 1.7–7.7)
Neutrophils Relative %: 74 %
Platelets: 395 10*3/uL (ref 150–400)
RBC: 4.2 MIL/uL (ref 3.87–5.11)
RDW: 15.4 % (ref 11.5–15.5)
WBC: 12.9 10*3/uL — ABNORMAL HIGH (ref 4.0–10.5)
nRBC: 0 % (ref 0.0–0.2)

## 2018-06-01 LAB — URINE CULTURE
Culture: NO GROWTH
SPECIAL REQUESTS: NORMAL

## 2018-06-01 LAB — GLUCOSE, CAPILLARY
GLUCOSE-CAPILLARY: 75 mg/dL (ref 70–99)
GLUCOSE-CAPILLARY: 115 mg/dL — AB (ref 70–99)
GLUCOSE-CAPILLARY: 108 mg/dL — AB (ref 70–99)
GLUCOSE-CAPILLARY: 183 mg/dL — AB (ref 70–99)
Glucose-Capillary: 77 mg/dL (ref 70–99)
Glucose-Capillary: 78 mg/dL (ref 70–99)

## 2018-06-01 LAB — BLOOD CULTURE ID PANEL (REFLEXED)
ACINETOBACTER BAUMANNII: NOT DETECTED
CANDIDA PARAPSILOSIS: NOT DETECTED
CANDIDA TROPICALIS: NOT DETECTED
Candida albicans: NOT DETECTED
Candida glabrata: NOT DETECTED
Candida krusei: NOT DETECTED
Enterobacter cloacae complex: NOT DETECTED
Enterobacteriaceae species: NOT DETECTED
Enterococcus species: NOT DETECTED
Escherichia coli: NOT DETECTED
HAEMOPHILUS INFLUENZAE: NOT DETECTED
KLEBSIELLA OXYTOCA: NOT DETECTED
KLEBSIELLA PNEUMONIAE: NOT DETECTED
Listeria monocytogenes: NOT DETECTED
METHICILLIN RESISTANCE: NOT DETECTED
Neisseria meningitidis: NOT DETECTED
Proteus species: NOT DETECTED
Pseudomonas aeruginosa: NOT DETECTED
STAPHYLOCOCCUS AUREUS BCID: NOT DETECTED
Serratia marcescens: NOT DETECTED
Staphylococcus species: DETECTED — AB
Streptococcus agalactiae: NOT DETECTED
Streptococcus pneumoniae: NOT DETECTED
Streptococcus pyogenes: NOT DETECTED
Streptococcus species: NOT DETECTED

## 2018-06-01 LAB — RAPID URINE DRUG SCREEN, HOSP PERFORMED
AMPHETAMINES: NOT DETECTED
Barbiturates: NOT DETECTED
Benzodiazepines: NOT DETECTED
Cocaine: NOT DETECTED
OPIATES: NOT DETECTED
TETRAHYDROCANNABINOL: NOT DETECTED

## 2018-06-01 LAB — BASIC METABOLIC PANEL
Anion gap: 10 (ref 5–15)
BUN: 15 mg/dL (ref 8–23)
CALCIUM: 8.7 mg/dL — AB (ref 8.9–10.3)
CO2: 24 mmol/L (ref 22–32)
Chloride: 106 mmol/L (ref 98–111)
Creatinine, Ser: 1.14 mg/dL — ABNORMAL HIGH (ref 0.44–1.00)
GFR calc Af Amer: 57 mL/min — ABNORMAL LOW (ref >60)
GFR, EST NON AFRICAN AMERICAN: 49 mL/min — AB (ref >60)
GLUCOSE: 102 mg/dL — AB (ref 70–99)
Potassium: 3.6 mmol/L (ref 3.5–5.1)
Sodium: 140 mmol/L (ref 135–145)

## 2018-06-01 LAB — HEMOGLOBIN A1C
Hgb A1c MFr Bld: 6.1 % — ABNORMAL HIGH (ref 4.8–5.6)
Mean Plasma Glucose: 128.37 mg/dL

## 2018-06-01 LAB — CK: CK TOTAL: 738 U/L — AB (ref 38–234)

## 2018-06-01 MED ORDER — VANCOMYCIN HCL IN DEXTROSE 750-5 MG/150ML-% IV SOLN
750.0000 mg | Freq: Two times a day (BID) | INTRAVENOUS | Status: DC
  Administered 2018-06-01 – 2018-06-02 (×2): 750 mg via INTRAVENOUS
  Filled 2018-06-01 (×2): qty 150

## 2018-06-01 MED ORDER — ENOXAPARIN SODIUM 40 MG/0.4ML ~~LOC~~ SOLN
40.0000 mg | SUBCUTANEOUS | Status: DC
  Administered 2018-06-02 – 2018-06-05 (×4): 40 mg via SUBCUTANEOUS
  Filled 2018-06-01 (×4): qty 0.4

## 2018-06-01 MED ORDER — SODIUM CHLORIDE 0.9 % IV SOLN
1.0000 g | Freq: Two times a day (BID) | INTRAVENOUS | Status: DC
  Administered 2018-06-01: 1 g via INTRAVENOUS
  Filled 2018-06-01 (×2): qty 1

## 2018-06-01 MED ORDER — VANCOMYCIN HCL IN DEXTROSE 1-5 GM/200ML-% IV SOLN
1000.0000 mg | INTRAVENOUS | Status: DC
  Administered 2018-06-01: 1000 mg via INTRAVENOUS
  Filled 2018-06-01: qty 200

## 2018-06-01 NOTE — Progress Notes (Signed)
PHARMACY - PHYSICIAN COMMUNICATION CRITICAL VALUE ALERT - BLOOD CULTURE IDENTIFICATION (BCID)  Mariah [name] is an 67 y.o. female who presented to [facility] on 05/31/2018 with a chief complaint of AMS  Assessment:  Pt presented with AMS. She has multiple wounds on her legs. It's draining so it could be staph aureus. Lab called with the blood culture results. BCID showed staph species and MecA neg. This is likely CNS and a contaminant. Pt is on vanc so FYI paged Dr. Perrin Smack.  Name of physician (or Provider) Contacted: Dr. Perrin Smack  Current antibiotics: Vanc  Changes to prescribed antibiotics recommended:  Continue vanc for now  Results for orders placed or performed during the hospital encounter of 05/31/18  Blood Culture ID Panel (Reflexed) (Collected: 05/31/2018  3:03 PM)  Result Value Ref Range   Enterococcus species NOT DETECTED NOT DETECTED   Listeria monocytogenes NOT DETECTED NOT DETECTED   Staphylococcus species DETECTED (A) NOT DETECTED   Staphylococcus aureus (BCID) NOT DETECTED NOT DETECTED   Methicillin resistance NOT DETECTED NOT DETECTED   Streptococcus species NOT DETECTED NOT DETECTED   Streptococcus agalactiae NOT DETECTED NOT DETECTED   Streptococcus pneumoniae NOT DETECTED NOT DETECTED   Streptococcus pyogenes NOT DETECTED NOT DETECTED   Acinetobacter baumannii NOT DETECTED NOT DETECTED   Enterobacteriaceae species NOT DETECTED NOT DETECTED   Enterobacter cloacae complex NOT DETECTED NOT DETECTED   Escherichia coli NOT DETECTED NOT DETECTED   Klebsiella oxytoca NOT DETECTED NOT DETECTED   Klebsiella pneumoniae NOT DETECTED NOT DETECTED   Proteus species NOT DETECTED NOT DETECTED   Serratia marcescens NOT DETECTED NOT DETECTED   Haemophilus influenzae NOT DETECTED NOT DETECTED   Neisseria meningitidis NOT DETECTED NOT DETECTED   Pseudomonas aeruginosa NOT DETECTED NOT DETECTED   Candida albicans NOT DETECTED NOT DETECTED   Candida glabrata NOT DETECTED NOT DETECTED    Candida krusei NOT DETECTED NOT DETECTED   Candida parapsilosis NOT DETECTED NOT DETECTED   Candida tropicalis NOT DETECTED NOT DETECTED   Onnie Boer, PharmD, BCIDP, AAHIVP, CPP Infectious Disease Pharmacist 06/01/2018 3:40 PM

## 2018-06-01 NOTE — Progress Notes (Signed)
Pharmacy Antibiotic Note  Mariah [name] is a 67 y.o. female admitted on 05/31/2018 with AMS/sepsis.  Pharmacy has been consulted for Vancomycin and Cefepime  Dosing.  Vancomycin 2 g IV given in ED at 1530    Plan: Vancomycin 1 g IV q24h Cefepime 1 g IV q12h  Height: 5\' 1"  (154.9 cm) Weight: 208 lb 8.9 oz (94.6 kg) IBW/kg (Calculated) : 47.8  Temp (24hrs), Avg:98.6 F (37 C), Min:98.4 F (36.9 C), Max:98.8 F (37.1 C)  Recent Labs  Lab 05/31/18 1401 05/31/18 1404  WBC 15.2*  --   CREATININE 1.59*  --   LATICACIDVEN  --  1.63    Estimated Creatinine Clearance: 37 mL/min (A) (by C-G formula based on SCr of 1.59 mg/dL (H)).    Allergies  Allergen Reactions  . Penicillins Hives and Other (See Comments)    ++ got keflex in 2013 and 2019++ Has patient had a PCN reaction causing immediate rash, facial/tongue/throat swelling, SOB or lightheadedness with hypotension: No Has patient had a PCN reaction causing severe rash involving mucus membranes or skin necrosis: No PATIENT HAS HAD A PCN REACTION THAT REQUIRED HOSPITALIZATION:  #  #  YES  #  #  Has patient had a PCN reaction occurring within the last 10 years: No   . Codeine Nausea And Vomiting  . Indomethacin Diarrhea    Caryl Pina 06/01/2018 12:05 AM

## 2018-06-01 NOTE — Evaluation (Signed)
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   Clinical Impression   Pt admitted with the above diagnoses and presents with below problem list. Pt will benefit from continued acute OT to address the below listed deficits and maximize independence with basic ADLs prior to d/c to next venue. PTA pt was living alone and had a neighbor who would check in on her. Unsure what PLOF was with basic ADLs as pt is unable to provide history this session. Pt with noted AMS, pain in BLE and generalized weakness impacting her ability to complete ADLs and basic functional transfers. Lethergic this session. Pt will need SNF at d/c at least for further rehab as she does not have the level of assistance needed to return home at this time.      Follow Up Recommendations  SNF    Equipment Recommendations  Other (comment)(defer to next venue)    Recommendations for Other Services PT consult     Precautions / Restrictions Precautions Precautions: Fall      Mobility Bed Mobility Overal bed mobility: Needs Assistance Bed Mobility: Rolling Rolling: Max assist         General bed mobility comments: asssit for LLE advancement and trunk movement. cues for sequencing  Transfers                 General transfer comment: unable to assess this session    Balance                                           ADL either performed or assessed with clinical judgement   ADL Overall ADL's : Needs assistance/impaired Eating/Feeding: Set up;Bed  level Eating/Feeding Details (indicate cue type and reason): Noted to be eating her breakfast with setup provided. Extended time needed to complete meal (dozing off due to lethargy?) Grooming: Maximal assistance;Bed level   Upper Body Bathing: Bed level;Maximal assistance   Lower Body Bathing: Total assistance;Bed level   Upper Body Dressing : Maximal assistance;Bed level   Lower Body Dressing: Total assistance;Bed level                 General ADL Comments: Pt completed rolling to each side for pericare. Will need +2 to attempt EOB.      Vision         Perception     Praxis      Pertinent Vitals/Pain Pain Assessment: Faces Faces Pain Scale: Hurts even more Pain Location: LLE while advance leg across bed, pt reporting R stinging thigh pain Pain Descriptors / Indicators: Grimacing;Guarding Pain Intervention(s): Monitored during session;Limited activity within patient's tolerance;Repositioned     Hand Dominance Right   Extremity/Trunk Assessment Upper Extremity Assessment Upper Extremity Assessment: Generalized weakness;Difficult to assess due to impaired cognition   Lower Extremity Assessment Lower Extremity Assessment: Defer to PT evaluation       Communication Communication Communication: Other (comment)(difficult to assess due to cognition, lethargic)   Cognition Arousal/Alertness: Lethargic Behavior During Therapy: WFL for tasks assessed/performed Overall Cognitive Status: No family/caregiver present to determine baseline cognitive  functioning                                 General Comments: not oriented to place, time, or situation. Decreased awareness. Not appropriate/very tangential speech during attempts to converse. Eyes often closed while speaking, addressing someone not in the room, hallucinating?   General Comments       Exercises     Shoulder Instructions      Home Living Family/patient expects to be discharged to:: Private  residence Living Arrangements: Alone Available Help at Discharge: Neighbor;Available PRN/intermittently Type of Home: Apartment Home Access: Level entry     Home Layout: One level     Bathroom Shower/Tub: Teacher, early years/pre: Standard     Home Equipment: Cane - single point;Crutches;Wheelchair - manual   Additional Comments: Pt unable to provide home data. gathered home setup data from last admission on 01/22/18.      Prior Functioning/Environment          Comments: using w/c at baseline. neighbors check in on her. Pt unable to provide PLOF.        OT Problem List: Decreased strength;Decreased activity tolerance;Impaired balance (sitting and/or standing);Decreased cognition;Decreased safety awareness;Decreased knowledge of use of DME or AE;Decreased knowledge of precautions;Obesity;Pain;Increased edema      OT Treatment/Interventions: Self-care/ADL training;DME and/or AE instruction;Therapeutic activities;Balance training;Patient/family education;Cognitive remediation/compensation;Therapeutic exercise    OT Goals(Current goals can be found in the care plan section) Acute Rehab OT Goals OT Goal Formulation: Patient unable to participate in goal setting Time For Goal Achievement: 06/15/18 Potential to Achieve Goals: Good ADL Goals Pt Will Perform Grooming: with min assist;bed level Pt Will Perform Upper Body Bathing: with mod assist;sitting Pt Will Perform Lower Body Bathing: with max assist;sit to/from stand;sitting/lateral leans Additional ADL Goal #1: Pt will complete bed mobilty at max A level to prepare for OOB ADLs  OT Frequency: Min 2X/week   Barriers to D/C:            Co-evaluation              AM-PAC PT "6 Clicks" Daily Activity     Outcome Measure Help from another person eating meals?: A Little Help from another person taking care of personal grooming?: A Lot Help from another person toileting, which includes using toliet, bedpan, or  urinal?: Total Help from another person bathing (including washing, rinsing, drying)?: Total Help from another person to put on and taking off regular upper body clothing?: A Lot Help from another person to put on and taking off regular lower body clothing?: Total 6 Click Score: 10   End of Session Nurse Communication: Other (comment)(NT assisting with pericare during rolling)  Activity Tolerance: Patient limited by lethargy Patient left: in bed;with call bell/phone within reach;with nursing/sitter in room  OT Visit Diagnosis: Muscle weakness (generalized) (M62.81);Pain;Other symptoms and signs involving cognitive function;Cognitive communication deficit (R41.841)                Time: 1035-1110 OT Time Calculation (min): 35 min Charges:  OT General Charges $OT Visit: 1 Visit OT Evaluation $OT Eval Moderate Complexity: 1 Mod OT Treatments $Self Care/Home Management : 8-22 mins  Tyrone Schimke, OT Acute Rehabilitation Services Pager: [phone] Office: [phone]   Hortencia Pilar 06/01/2018, 1:33 PM

## 2018-06-01 NOTE — Consult Note (Signed)
[place] Nurse wound consult note I have paged Dr. Frederico Hamman at [phone] without response, and have reached out via pager to Dr. Rebeca Alert at [phone], without response.  I have placed a notice in the Treatment Team Sticky Notes asking her medical team to review my notes and recommendations.  Val Riles, RN, MSN, CWOCN, CNS-BC, pager [phone]

## 2018-06-01 NOTE — Progress Notes (Addendum)
   Subjective: Ms. Defrain reported feeling okay this morning. She does not recall having a seizure or other symptoms prior to coming to the hospital. She said she has had worsening lower extremity pain and weakness for about a week. She also noticed drainage from her skin lesions start about a week ago. She recently started using a walker and/or cane to get around. She said her LE skin lesions were not painful but she was having pain in her left knee.   Objective:  Vital signs in last 24 hours: Vitals:   05/31/18 2340 06/01/18 0500 06/01/18 0526 06/01/18 0840  BP: (!) 173/90  (!) 156/70   Pulse: 91  81 80  Resp: 17  18 18   Temp: 98.8 F (37.1 C)  98.1 F (36.7 C)   TempSrc: Oral  Oral   SpO2: 100%  94% 96%  Weight:  94.1 kg    Height:       Physical Exam  Constitutional: She is well-developed, well-nourished, and in no distress.  Abdominal: Soft. She exhibits no distension. There is no tenderness.  Musculoskeletal: She exhibits edema, tenderness and deformity.  Skin:  Extensive keratotic depositions, dry, tree bark appearance of skin overlying bilateral feet     Assessment/Plan:  Active Problems:   Altered mental state  Ms. 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  Altered mental status Most likely secondary to lower extremity infection. Cannot exclude seizure, given report that patient was found sitting in her feces. Unclear whether patient has been taking her medications.  -discontinue cardiac monitoring -PT/OT evaluation -UDS negative -f/u blood cultures  Left lower extremity cellulitis with chronic venous stasis Patient with chronic lower extremity venous stasis and lymphedema now with new multiple open wound with purulent discharge on the  left lower extremity.  Skin is warm to touch, erythematous, non-tender lesions -WBC 12.9, down from 15.2 on admission; afebrile, hemodynamically stable -continue IV vancomycin  -f/u blood cultures  Hypertension -continue home amlodipine 10 mg daily -Hold lisinopril  Diabetes mellitus  -moderate sliding scale insulin with bedtime correction.  Seizure disorder -Continue Keppra 500 mg daily  Acute kidney injury -Cr 1.14, BUN 15, impoved  -f/u am bmp   Dispo: Anticipated discharge pending cultures and PT evaluation with possible SNF placement. She has had declining health and lives alone with little support, would benefit from at least short term SNF placement.  ,  N, DO 06/01/2018, 10:30 AM Pager: [phone]

## 2018-06-01 NOTE — Discharge Summary (Addendum)
Name: Mariah [name] MRN: [id number] DOB: [birth year] 67 y.o. PCP: [name], MD  Date of Admission: 05/31/2018 12:40 PM Date of Discharge: 06/05/2018 Attending Physician: Axel Filler, *  Discharge Diagnosis: 1. Acute Encephalopathy 2. Left LE Cellulitis 3. Chronic venous stasis dermatitis  4. AKI  Discharge Medications: Allergies as of 06/05/2018      Reactions   Penicillins Hives, Other (See Comments)   ++ got keflex in 2013 and 2019++ Has patient had a PCN reaction causing immediate rash, facial/tongue/throat swelling, SOB or lightheadedness with hypotension: No Has patient had a PCN reaction causing severe rash involving mucus membranes or skin necrosis: No PATIENT HAS HAD A PCN REACTION THAT REQUIRED HOSPITALIZATION:  #  #  YES  #  #  Has patient had a PCN reaction occurring within the last 10 years: No   Codeine Nausea And Vomiting   Indomethacin Diarrhea      Medication List    STOP taking these medications   HYDROcodone-acetaminophen 5-325 MG tablet Commonly known as:  NORCO/VICODIN   losartan 100 MG tablet Commonly known as:  COZAAR     TAKE these medications   albuterol 108 (90 Base) MCG/ACT inhaler Commonly known as:  PROVENTIL HFA;VENTOLIN HFA Inhale 2 puffs into the lungs every 6 (six) hours as needed for wheezing or shortness of breath.   amLODipine 10 MG tablet Commonly known as:  NORVASC Take 1 tablet (10 mg total) by mouth daily. PATIENT MUST BE SEEN FOR ADDITIONAL REFILLS What changed:    additional instructions  Another medication with the same name was removed. Continue taking this medication, and follow the directions you see here.   esomeprazole 20 MG capsule Commonly known as:  NEXIUM Take 1 capsule (20 mg total) by mouth every morning. For acid reflex   Fluticasone-Salmeterol 100-50 MCG/DOSE Aepb Commonly known as:  ADVAIR Inhale 1 puff into the lungs 2 (two) times daily. What changed:  See the new instructions.     furosemide 40 MG tablet Commonly known as:  LASIX TAKE ONE TABLET BY MOUTH EVERY DAY   glipiZIDE 10 MG 24 hr tablet Commonly known as:  GLUCOTROL XL TAKE TWO TABLETS BY MOUTH EVERY DAY   JANUMET 50-1000 MG tablet Generic drug:  sitaGLIPtin-metformin TAKE ONE TABLET BY MOUTH TWICE DAILY WITH MEALS   levETIRAcetam 500 MG tablet Commonly known as:  KEPPRA Take 1 tablet (500 mg total) by mouth 2 (two) times daily.   lisinopril 40 MG tablet Commonly known as:  PRINIVIL,ZESTRIL Take 40 mg by mouth daily.   mometasone 50 MCG/ACT nasal spray Commonly known as:  NASONEX Place 1 spray into the nose as needed. What changed:  reasons to take this   naproxen 500 MG tablet Commonly known as:  NAPROSYN Take 1 tablet (500 mg total) by mouth 2 (two) times daily with a meal. What changed:    when to take this  reasons to take this       Disposition and follow-up:   [name] was discharged from [facility] in Stable condition.  At the hospital follow up visit please address:  1.  Bilateral lower extremity lesions- Unclear how long these have been present, but it does not appear they have been biopsied in the past. Would recommend dermatology referral   2. Postmenopausal bleeding- patient reports of ongoing postmenopausal bleeding with pain. Will require outpatient follow-up  3.  Labs / imaging needed at time of follow-up: none  4.  Pending  labs/ test needing follow-up: none  Follow-up Appointments: Contact information for after-discharge care    [facility] .   Service:  Skilled Nursing Contact information: [address] [place] Hospital Course by problem list: 1. Acute encephalopathy 2/2 Left lower extremity cellulitis associated with chronic venous stasis - Patient initially reported to [facility] ED after a neighbor found her on the floor  sitting her feces.  She is wheelchair-bound and was found on her knees.  She was noted to be somnolent though alert and oriented.  She reported that for the past 2 weeks she has had multiple ground-level falls due to lower extremity pain.  She also reported of recent purulent drainage from her left lower extremity venous stasis however denies fevers, chills. Patient/s aunt reported that she has been confused and hallucinating for the past several days as she has been talking about her deceased mother and deceased dog. On arrival to [facility],  she had a rectal temp of 100, tachypneic, leukocytosis of 15.2, CMP shows AKI with elevated BUN/creatinine, and chest x-ray unremarkable, CT head shows stable sellar and suprasellar mass consistent with macroadenoma.  Patient received 1 L bolus saline and subsequently started on on cefepime and vancomycin. Bcx grew coag negative staph in 1/4 bottles, but this is likely a contaminant. Switched to oral doxycycline for 3 days. LE lesions did not have any further drainage. Would recommend outpatient dermatology evaluation. Patient's mentation improved during hospital course. She was discharged to SNF.   2. AKI: Most likely prerenal in the setting of dehydration. Improved with IV fluids. Cr ws 1.59 on admission, improved to 0.91.   3. Postmenopausal bleeding: Per chart review, patient has a history of postmenopausal bleeding and has been referred to OB/GYN but has not followed up.  Also noted is uterine cancer in her mother.  She still reports of ongoing postmenopausal bleeding with pain. Will require outpatient follow-up  Discharge Vitals:   BP (!) 153/80 (BP Location: Right Arm)   Pulse (!) 104   Temp 98.7 F (37.1 C) (Oral)   Resp (!) 30   Ht 5\' 1"  (1.549 m)   Wt 86.9 kg   LMP 12/19/2017   SpO2 97%   BMI 36.20 kg/m   Pertinent Labs, Studies, and Procedures:   CBC Latest Ref Rng & Units 06/04/2018 06/03/2018 06/01/2018  WBC 4.0 - 10.5 K/uL 13.7(H) 13.5(H) 12.9(H)    Hemoglobin 12.0 - 15.0 g/dL 11.3(L) 10.5(L) 12.0  Hematocrit 36.0 - 46.0 % 35.8(L) 34.1(L) 37.9  Platelets 150 - 400 K/uL 415(H) 424(H) 395   CMP Latest Ref Rng & Units 06/04/2018 06/03/2018 06/02/2018  Glucose 70 - 99 mg/dL 126(H) 137(H) 104(H)  BUN 8 - 23 mg/dL 17 13 12   Creatinine 0.44 - 1.00 mg/dL 0.91 1.09(H) 1.05(H)  Sodium 135 - 145 mmol/L 136 136 137  Potassium 3.5 - 5.1 mmol/L 3.6 3.7 3.4(L)  Chloride 98 - 111 mmol/L 104 104 106  CO2 22 - 32 mmol/L 25 25 23   Calcium 8.9 - 10.3 mg/dL 8.6(L) 8.5(L) 8.1(L)  Total Protein 6.5 - 8.1 g/dL - - -  Total Bilirubin 0.3 - 1.2 mg/dL - - -  Alkaline Phos 38 - 126 U/L - - -  AST 15 - 41 U/L - - -  ALT 0 - 44 U/L - - -    Discharge Instructions: Discharge Instructions  Diet - low sodium heart healthy   Complete by:  As directed    Discharge instructions   Complete by:  As directed    Ms. Hofmeister,  Please resume all of your home medications and follow up with your PCP in 1-2 weeks.   Increase activity slowly   Complete by:  As directed       Signed: Ivonne Freeburg, Charlsie Quest, DO 06/05/2018, 3:01 PM   Pager: [phone]

## 2018-06-01 NOTE — Consult Note (Addendum)
[facility] Nurse wound consult note Patient evaluated in [facility] [phone].  No family present.  Patient very lethargic this morning, not opening eyes.  When asked if anyone has ever performed a biopsy on the lesions on her left leg, she responded "no". Reason for Consult: Left LE foul-smelling lesions, present for many years. Wound type: Unknown etiology.  The wounds on the legs, and the appearance of the legs, are not typical of venous stasis.  I question if the largest cluster of protrusions on the left lateral lower leg, from which yellow drainage is oozing, is some type of cancerous lesion.  I have paged Dr. Perrin Smack at [phone] to discuss care of the area.  I recommend a biopsy of the affected tissue for histologic exam and diagnosis of any cancerous abnormality.  Also, to consider having surgical services evaluate the patient. For now, leave the legs open to air. Thank you for the consult.  Discussed plan of care with the patient and bedside nurse.  [facility] nurse will not follow at this time.  Please re-consult the [facility] team if needed.  Val Riles, RN, MSN, CWOCN, CNS-BC, pager [phone]

## 2018-06-01 NOTE — Progress Notes (Signed)
Internal Medicine Attending:   I saw and examined the patient. I reviewed the resident's note and I agree with the resident's findings and plan as documented in the resident's note.  Principal Problem:   Acute encephalopathy Active Problems:   Chronic venous stasis dermatitis of both lower extremities   Cellulitis of leg, left   Hospital day #2 with acute encephalopathy and decreased functioning at home. She is making some improvement with supportive care. We are treating a probably left lower extremity cellulitis associated with chronic stasis skin changes. Will continue to provide supportive care, PT and placement. I hope we can coordinate more of her outpatient care and support with family as she is not doing well at home.  Lalla Brothers, MD

## 2018-06-01 NOTE — Evaluation (Signed)
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  Clinical Impression  Pt admitted with/for FTT and problems stated above.  Pt was lying in bed unable to move LE's, very painful, needing significant assist to get to EOB.  I'm not sure of PLOF, but was living alone at a w/c level.  Pt currently limited functionally due to the problems listed. ( See problems list.)   Pt will benefit from PT to maximize function and safety in order to get ready for next venue listed below.     Follow Up Recommendations SNF;Supervision/Assistance - 24 hour    Equipment Recommendations  (TBA)    Recommendations for Other Services       Precautions / Restrictions Precautions Precautions: Fall      Mobility  Bed Mobility Overal bed mobility: Needs Assistance Bed Mobility: Rolling;Supine to Sit;Sit to Supine Rolling: Max assist   Supine to sit: Total assist Sit to supine: Total assist   General bed mobility comments: Several roll to get bed clean and for pericare.  Pt needed significant assist to come forward and up to EOB.  Little pt assist  Transfers                 General transfer comment: unable to assess this session  Ambulation/Gait                Stairs            Wheelchair Mobility    Modified Rankin (Stroke Patients Only)       Balance Overall balance assessment: Needs assistance Sitting-balance support: Single extremity supported;Bilateral upper extremity supported Sitting balance-Leahy Scale: Poor Sitting balance - Comments: pt needed UE's  to support herself                                     Pertinent Vitals/Pain Pain Assessment: Faces Faces Pain Scale: Hurts even more Pain Location: LLE while advance leg across bed, pt reporting R stinging thigh pain Pain Descriptors / Indicators: Grimacing;Guarding Pain Intervention(s): Monitored during session    Home Living Family/patient expects to be discharged to:: Private residence Living Arrangements: Alone Available Help at Discharge: Neighbor;Available PRN/intermittently Type of Home: Apartment Home Access: Level entry     Home Layout: One level Home Equipment: Cane - single point;Crutches;Wheelchair - manual Additional Comments: Pt unable to provide home data. gathered home setup data from last admission on 01/22/18.    Prior Function           Comments: using w/c at baseline. neighbors check in on her. Pt unable to provide PLOF.     Hand Dominance   Dominant Hand: Right    Extremity/Trunk Assessment   Upper Extremity Assessment Upper Extremity Assessment: Generalized weakness    Lower Extremity Assessment Lower Extremity Assessment: RLE deficits/detail;LLE deficits/detail RLE Deficits / Details: Significant knee and hip ROM limitations.  Knee flexion to 45* sitting EOB.   Unable to move against gravity RLE: Unable to fully assess due to pain RLE Coordination: decreased gross motor;decreased fine motor LLE Deficits / Details: significantly limited knee/hip ROM,  knee flexion at EOB < 45* LLE: Unable to fully assess due to pain LLE Coordination: decreased gross motor;decreased fine motor       Communication   Communication: Other (comment)(difficult to assess due to cognition, lethargic)  Cognition Arousal/Alertness: Lethargic Behavior During Therapy: WFL for tasks assessed/performed Overall Cognitive Status: No family/caregiver present to determine baseline cognitive functioning                                 General  Comments: not oriented to place, time, or situation. Decreased awareness. Not appropriate/very tangential speech during attempts to converse. Eyes often closed while speaking, addressing someone not in the room, hallucinating?      General Comments      Exercises     Assessment/Plan    PT Assessment Patient needs continued PT services  PT Problem List Decreased strength;Decreased range of motion;Decreased activity tolerance;Decreased balance;Decreased mobility;Decreased coordination;Decreased skin integrity;Pain       PT Treatment Interventions DME instruction;Functional mobility training;Therapeutic activities;Balance training;Patient/family education    PT Goals (Current goals can be found in the Care Plan section)  Acute Rehab PT Goals Patient Stated Goal: pt unable PT Goal Formulation: Patient unable to participate in goal setting Time For Goal Achievement: 06/15/18 Potential to Achieve Goals: Fair    Frequency Min 3X/week   Barriers to discharge Decreased caregiver support      Co-evaluation               AM-PAC PT "6 Clicks" Daily Activity  Outcome Measure Difficulty turning over in bed (including adjusting bedclothes, sheets and blankets)?: Unable Difficulty moving from lying on back to sitting on the side of the bed? : Unable Difficulty sitting down on and standing up from a chair with arms (e.g., wheelchair, bedside commode, etc,.)?: Unable Help needed moving to and from a bed to chair (including a wheelchair)?: Total Help needed walking in hospital room?: Total Help needed climbing 3-5 steps with a railing? : Total 6 Click Score: 6    End of Session   Activity Tolerance: Patient tolerated treatment well;Patient limited by pain;Patient limited by fatigue Patient left: in bed;with call bell/phone within reach;with bed alarm set Nurse Communication: Mobility status;Need for lift equipment PT Visit Diagnosis: Muscle weakness (generalized)  (M62.81);Pain;Adult, failure to thrive (R62.7) Pain - Right/Left: (bil LE's) Pain - part of body: (Legs)    Time: 1415-1450 PT Time Calculation (min) (ACUTE ONLY): 35 min   Charges:   PT Evaluation $PT Eval Moderate Complexity: 1 Mod PT Treatments $Therapeutic Activity: 8-22 mins        06/01/2018  Donnella Sham, PT Acute Rehabilitation Services [phone]  (pager) [phone]  (office)  Tessie Fass Harue Pribble 06/01/2018, 3:43 PM

## 2018-06-02 DIAGNOSIS — N179 Acute kidney failure, unspecified: Secondary | ICD-10-CM

## 2018-06-02 DIAGNOSIS — N183 Chronic kidney disease, stage 3 unspecified: Secondary | ICD-10-CM

## 2018-06-02 LAB — BASIC METABOLIC PANEL
ANION GAP: 8 (ref 5–15)
BUN: 12 mg/dL (ref 8–23)
CO2: 23 mmol/L (ref 22–32)
CREATININE: 1.05 mg/dL — AB (ref 0.44–1.00)
Calcium: 8.1 mg/dL — ABNORMAL LOW (ref 8.9–10.3)
Chloride: 106 mmol/L (ref 98–111)
GFR calc Af Amer: >60 mL/min (ref >60)
GFR calc non Af Amer: 55 mL/min — ABNORMAL LOW (ref >60)
GLUCOSE: 104 mg/dL — AB (ref 70–99)
Potassium: 3.4 mmol/L — ABNORMAL LOW (ref 3.5–5.1)
Sodium: 137 mmol/L (ref 135–145)

## 2018-06-02 LAB — GLUCOSE, CAPILLARY
GLUCOSE-CAPILLARY: 94 mg/dL (ref 70–99)
GLUCOSE-CAPILLARY: 177 mg/dL — AB (ref 70–99)
GLUCOSE-CAPILLARY: 107 mg/dL — AB (ref 70–99)
GLUCOSE-CAPILLARY: 145 mg/dL — AB (ref 70–99)

## 2018-06-02 MED ORDER — POTASSIUM CHLORIDE CRYS ER 20 MEQ PO TBCR
40.0000 meq | EXTENDED_RELEASE_TABLET | Freq: Once | ORAL | Status: AC
  Administered 2018-06-02: 40 meq via ORAL
  Filled 2018-06-02: qty 2

## 2018-06-02 MED ORDER — DOXYCYCLINE HYCLATE 100 MG PO TABS
100.0000 mg | ORAL_TABLET | Freq: Two times a day (BID) | ORAL | Status: DC
  Administered 2018-06-02 – 2018-06-04 (×5): 100 mg via ORAL
  Filled 2018-06-02 (×5): qty 1

## 2018-06-02 NOTE — Progress Notes (Signed)
   Subjective: Ms. Tuckett appears somnolent this morning but is arousable to voice. Her speech is nonsensical this AM. She did not voice any complaints.   Objective:  Vital signs in last 24 hours: Vitals:   06/01/18 1505 06/01/18 2123 06/02/18 0416 06/02/18 0751  BP: (!) 147/64 (!) 145/58 (!) 154/76   Pulse: 94 (!) 102 96   Resp: 20 18 18    Temp: 98.8 F (37.1 C)  99.3 F (37.4 C)   TempSrc: Oral  Oral   SpO2: 97% 98% 96% 98%  Weight:      Height:       General: somnolent but arousable to voice, in no acute distress  CV: RRR, no murmurs  Ext: bilateral hyperketatotic lesions worse on the L with dry drainage on the L, unchanged from yesterday  Assessment/Plan:  Principal Problem:   Acute encephalopathy Active Problems:   Chronic venous stasis dermatitis of both lower extremities   Cellulitis of leg, left   AKI (acute kidney injury) ([place])  # Acute encephalopathy 2/2 LLE cellulitis: Ms. Finau continues to be somnolent on exam but arouses easily to voice. Her speech remains nonsensical. I am not sure how much her change in mental status is associated to this infection vs her poor baseline mentation vs daytime somnolence from possible OSA. She has been afebrile and hemodynamically stable during this admission. She is on day 3 of vancomycin. Bcx grew coag negative staph in 1/4 bottle, but this is likely a contaminant. Will switch to PO antibiotic therapy.  - STOP vancomycin  - START doxycycline 100 mg BID - She is medically stable for discharge to SNF when a bed is available   # AKI: Continues to improve, renal function close to baseline.   # Hypokalemia: K 3.4. Ordered Kdur 40 mEq x1.   Dispo: Anticipated discharge in approximately 2-3 day(s) infectious workup and SNF placement.   Welford Roche, MD 06/02/2018, 10:27 AM Pager: [phone]

## 2018-06-03 DIAGNOSIS — R4182 Altered mental status, unspecified: Secondary | ICD-10-CM

## 2018-06-03 LAB — BASIC METABOLIC PANEL WITH GFR
Anion gap: 7 (ref 5–15)
BUN: 13 mg/dL (ref 8–23)
CO2: 25 mmol/L (ref 22–32)
Calcium: 8.5 mg/dL — ABNORMAL LOW (ref 8.9–10.3)
Chloride: 104 mmol/L (ref 98–111)
Creatinine, Ser: 1.09 mg/dL — ABNORMAL HIGH (ref 0.44–1.00)
GFR calc Af Amer: >60 mL/min
GFR calc non Af Amer: 52 mL/min — ABNORMAL LOW
Glucose, Bld: 137 mg/dL — ABNORMAL HIGH (ref 70–99)
Potassium: 3.7 mmol/L (ref 3.5–5.1)
Sodium: 136 mmol/L (ref 135–145)

## 2018-06-03 LAB — CULTURE, BLOOD (ROUTINE X 2): Special Requests: ADEQUATE

## 2018-06-03 LAB — CBC
HEMATOCRIT: 34.1 % — AB (ref 36.0–46.0)
Hemoglobin: 10.5 g/dL — ABNORMAL LOW (ref 12.0–15.0)
MCH: 27.8 pg (ref 26.0–34.0)
MCHC: 30.8 g/dL (ref 30.0–36.0)
MCV: 90.2 fL (ref 80.0–100.0)
NRBC: 0 % (ref 0.0–0.2)
PLATELETS: 424 10*3/uL — AB (ref 150–400)
RBC: 3.78 MIL/uL — AB (ref 3.87–5.11)
RDW: 15.8 % — ABNORMAL HIGH (ref 11.5–15.5)
WBC: 13.5 10*3/uL — AB (ref 4.0–10.5)

## 2018-06-03 LAB — GLUCOSE, CAPILLARY
GLUCOSE-CAPILLARY: 154 mg/dL — AB (ref 70–99)
Glucose-Capillary: 112 mg/dL — ABNORMAL HIGH (ref 70–99)
Glucose-Capillary: 178 mg/dL — ABNORMAL HIGH (ref 70–99)
Glucose-Capillary: 197 mg/dL — ABNORMAL HIGH (ref 70–99)

## 2018-06-03 MED ORDER — FUROSEMIDE 40 MG PO TABS
40.0000 mg | ORAL_TABLET | Freq: Every day | ORAL | Status: DC
  Administered 2018-06-03 – 2018-06-05 (×3): 40 mg via ORAL
  Filled 2018-06-03 (×3): qty 1

## 2018-06-03 MED ORDER — ALBUTEROL SULFATE (2.5 MG/3ML) 0.083% IN NEBU: 3.0000 mL | INHALATION_SOLUTION | RESPIRATORY_TRACT | Status: DC | PRN

## 2018-06-03 NOTE — Progress Notes (Signed)
   Subjective:  No acute events overnight.  Mariah [name] is doing well this morning while eating breakfast.  She denies any acute complaints.  Reports she has been ambulating without difficult, though she is wheelchair-bound at home.  Objective:  Vital signs in last 24 hours: Vitals:   06/02/18 1701 06/02/18 2153 06/03/18 0608 06/03/18 0610  BP: (!) 153/90 (!) 156/75  (!) 159/77  Pulse: 90 98  91  Resp:      Temp: 98.4 F (36.9 C) 99.1 F (37.3 C)  98.9 F (37.2 C)  TempSrc: Oral Oral    SpO2: 99% 100%  98%  Weight:   89.7 kg   Height:       General: Chronically ill-appearing female, well-developed, in no acute distress Ext: unchanged from yesterday, dry drainage over verrucous lesions on LLE, no new purulent drainage noted   Assessment/Plan:  Principal Problem:   Acute encephalopathy Active Problems:   Chronic venous stasis dermatitis of both lower extremities   Cellulitis of leg, left   AKI (acute kidney injury) ([place])   # Acute encephalopathy 2/2 LLE cellulitis: Mariah [name] was alert when seen this morning. She was noted to be confabulating when interviewed by the team. Her mentation has been poor for at least a year and likely exacerbated by her infection but ultimately is not new. She remains afebrile and hemodynamically stable after switching to doxycycline yesterday. Bcx grew Coag negative Staph in 1/4 bottles but I suspect is likely a contaminant.  - Continue doxycycline, day 4 of antibiotics  - Medically stable for discharge to SNF when bed is available   # Bilateral LE lesions: Unclear how long these have been present, but it does not appear they have been biopsied in the past. Has not been seen by dermatology.  - Dermatology referral   Dispo: Anticipated discharge in approximately 2-3 day(s) pending .   Welford Roche, MD 06/03/2018, 10:54 AM Pager: [phone]

## 2018-06-03 NOTE — Progress Notes (Signed)
Internal Medicine Attending  Date: 06/03/2018  Patient name: Mariah [name] Medical record number: [id number] Date of birth: 29-Jan-1951 Age: 67 y.o. Gender: female  I saw and evaluated the patient. I reviewed the resident's note by Dr. Isac Sarna and I agree with the resident's findings and plans as documented in her progress note.  When seen on rounds this morning Mariah [name] was eating breakfast and communicative.  She had no acute complaints.  On exam she had no purulent drainage over the verrucous lesions on her left lower extremity.  Although she has impressive bilateral chronic venous stasis changes the verrucous lesions in the lateral aspect of the left lower extremity are somewhat unusual.  We will consider referral to dermatology as an outpatient for biopsy.  Otherwise, we are awaiting skilled nursing facility placement as we continue oral doxycycline for her cellulitis.

## 2018-06-04 ENCOUNTER — Inpatient Hospital Stay: Admission: RE | Admit: 2018-06-04 | Payer: Medicare Other | Source: Ambulatory Visit

## 2018-06-04 LAB — CBC
HEMATOCRIT: 35.8 % — AB (ref 36.0–46.0)
HEMOGLOBIN: 11.3 g/dL — AB (ref 12.0–15.0)
MCH: 28.3 pg (ref 26.0–34.0)
MCHC: 31.6 g/dL (ref 30.0–36.0)
MCV: 89.5 fL (ref 80.0–100.0)
Platelets: 415 10*3/uL — ABNORMAL HIGH (ref 150–400)
RBC: 4 MIL/uL (ref 3.87–5.11)
RDW: 15.7 % — ABNORMAL HIGH (ref 11.5–15.5)
WBC: 13.7 10*3/uL — AB (ref 4.0–10.5)
nRBC: 0 % (ref 0.0–0.2)

## 2018-06-04 LAB — BASIC METABOLIC PANEL
ANION GAP: 7 (ref 5–15)
BUN: 17 mg/dL (ref 8–23)
CHLORIDE: 104 mmol/L (ref 98–111)
CO2: 25 mmol/L (ref 22–32)
Calcium: 8.6 mg/dL — ABNORMAL LOW (ref 8.9–10.3)
Creatinine, Ser: 0.91 mg/dL (ref 0.44–1.00)
GFR calc non Af Amer: >60 mL/min (ref >60)
Glucose, Bld: 126 mg/dL — ABNORMAL HIGH (ref 70–99)
Potassium: 3.6 mmol/L (ref 3.5–5.1)
Sodium: 136 mmol/L (ref 135–145)

## 2018-06-04 LAB — GLUCOSE, CAPILLARY
GLUCOSE-CAPILLARY: 122 mg/dL — AB (ref 70–99)
GLUCOSE-CAPILLARY: 141 mg/dL — AB (ref 70–99)
Glucose-Capillary: 126 mg/dL — ABNORMAL HIGH (ref 70–99)
Glucose-Capillary: 150 mg/dL — ABNORMAL HIGH (ref 70–99)

## 2018-06-04 NOTE — Progress Notes (Signed)
   Subjective: Ms. Buren reported feeling about the same this morning. She could not remember working with PT. She denied any LE pain this morning.   Objective:  Vital signs in last 24 hours: Vitals:   06/03/18 0610 06/03/18 2125 06/04/18 0502 06/04/18 0849  BP: (!) 159/77 (!) 156/90 (!) 163/74   Pulse: 91 (!) 104 99   Resp:      Temp: 98.9 F (37.2 C) 98.8 F (37.1 C) 99.4 F (37.4 C)   TempSrc:  Oral    SpO2: 98% 98% 92% 93%  Weight:      Height:       Physical Exam  Constitutional: She is well-developed, well-nourished, and in no distress.  Extremities: unchanged, dry, no drainage appreciated over verrucous lesions  Assessment/Plan:  Principal Problem:   Cellulitis of leg, left Active Problems:   Chronic venous stasis dermatitis of both lower extremities   Acute encephalopathy   AKI (acute kidney injury) ([place])   Altered mental status  Acute encephalopathy 2/2 LLE cellulitis  Ms. Meenan was alert when seen this morning. She did not remember working with PT. Afebrile and hemodynamically stable. Will discontinue antibiotics at this time as LE lesions have remained dry for several days and her mental status has improved as well. - discontinue abx - Medically stable for discharge to SNF when bed is available   Bilateral LE lesions - Dermatology referral   Dispo: Anticipated discharge pending SNF placement.  Mike Craze, DO 06/04/2018, 9:38 AM Pager: [phone]

## 2018-06-04 NOTE — Progress Notes (Signed)
CSW reached out to patient's only listed contact to discuss discharge plan since patient is confused. Patient's friend Regino Schultze states that she knows patient has a niece and aunts but does not know their contact information. She states she has not heard from the patient in a little while and has been concerned about her. She reports that she will try to find their contact info for CSW.  Percell Locus Calise Dunckel LCSW [phone]

## 2018-06-04 NOTE — Progress Notes (Addendum)
Internal Medicine Attending:   I saw and examined the patient. I reviewed the resident's note and I agree with the resident's findings and plan as documented in the resident's note.  Principal Problem:   Cellulitis of leg, left Active Problems:   Chronic venous stasis dermatitis of both lower extremities   Acute encephalopathy   AKI (acute kidney injury) ([place])   Altered mental status   Hospital day #4, encephalopathy seems to have resolved and mental status is at her baseline. Infection in the left leg also resolved, ok to stop antibiotics. Sepsis ruled out. Medically ready for discharge to SNF when bed available.  Lalla Brothers, MD

## 2018-06-05 DIAGNOSIS — Z79899 Other long term (current) drug therapy: Secondary | ICD-10-CM | POA: Diagnosis not present

## 2018-06-05 DIAGNOSIS — Z888 Allergy status to other drugs, medicaments and biological substances status: Secondary | ICD-10-CM | POA: Diagnosis not present

## 2018-06-05 DIAGNOSIS — E119 Type 2 diabetes mellitus without complications: Secondary | ICD-10-CM | POA: Diagnosis not present

## 2018-06-05 DIAGNOSIS — R269 Unspecified abnormalities of gait and mobility: Secondary | ICD-10-CM | POA: Diagnosis not present

## 2018-06-05 DIAGNOSIS — Z993 Dependence on wheelchair: Secondary | ICD-10-CM

## 2018-06-05 DIAGNOSIS — Z8742 Personal history of other diseases of the female genital tract: Secondary | ICD-10-CM | POA: Diagnosis not present

## 2018-06-05 DIAGNOSIS — Z880 Allergy status to penicillin: Secondary | ICD-10-CM

## 2018-06-05 DIAGNOSIS — R41841 Cognitive communication deficit: Secondary | ICD-10-CM | POA: Diagnosis not present

## 2018-06-05 DIAGNOSIS — G40909 Epilepsy, unspecified, not intractable, without status epilepticus: Secondary | ICD-10-CM | POA: Diagnosis not present

## 2018-06-05 DIAGNOSIS — K219 Gastro-esophageal reflux disease without esophagitis: Secondary | ICD-10-CM | POA: Diagnosis not present

## 2018-06-05 DIAGNOSIS — N950 Postmenopausal bleeding: Secondary | ICD-10-CM

## 2018-06-05 DIAGNOSIS — E1151 Type 2 diabetes mellitus with diabetic peripheral angiopathy without gangrene: Secondary | ICD-10-CM | POA: Diagnosis not present

## 2018-06-05 DIAGNOSIS — J309 Allergic rhinitis, unspecified: Secondary | ICD-10-CM | POA: Diagnosis not present

## 2018-06-05 DIAGNOSIS — R400 Somnolence: Secondary | ICD-10-CM | POA: Diagnosis not present

## 2018-06-05 DIAGNOSIS — E669 Obesity, unspecified: Secondary | ICD-10-CM | POA: Diagnosis not present

## 2018-06-05 DIAGNOSIS — J45909 Unspecified asthma, uncomplicated: Secondary | ICD-10-CM | POA: Diagnosis not present

## 2018-06-05 DIAGNOSIS — Z885 Allergy status to narcotic agent status: Secondary | ICD-10-CM

## 2018-06-05 DIAGNOSIS — Z7401 Bed confinement status: Secondary | ICD-10-CM | POA: Diagnosis not present

## 2018-06-05 DIAGNOSIS — N179 Acute kidney failure, unspecified: Secondary | ICD-10-CM | POA: Diagnosis not present

## 2018-06-05 DIAGNOSIS — M19072 Primary osteoarthritis, left ankle and foot: Secondary | ICD-10-CM | POA: Diagnosis not present

## 2018-06-05 DIAGNOSIS — R600 Localized edema: Secondary | ICD-10-CM | POA: Diagnosis not present

## 2018-06-05 DIAGNOSIS — R262 Difficulty in walking, not elsewhere classified: Secondary | ICD-10-CM | POA: Diagnosis not present

## 2018-06-05 DIAGNOSIS — L03116 Cellulitis of left lower limb: Secondary | ICD-10-CM | POA: Diagnosis not present

## 2018-06-05 DIAGNOSIS — M19071 Primary osteoarthritis, right ankle and foot: Secondary | ICD-10-CM | POA: Diagnosis not present

## 2018-06-05 DIAGNOSIS — E161 Other hypoglycemia: Secondary | ICD-10-CM | POA: Diagnosis not present

## 2018-06-05 DIAGNOSIS — I872 Venous insufficiency (chronic) (peripheral): Secondary | ICD-10-CM | POA: Diagnosis not present

## 2018-06-05 DIAGNOSIS — G9340 Encephalopathy, unspecified: Secondary | ICD-10-CM | POA: Diagnosis not present

## 2018-06-05 DIAGNOSIS — M170 Bilateral primary osteoarthritis of knee: Secondary | ICD-10-CM | POA: Diagnosis not present

## 2018-06-05 DIAGNOSIS — M6281 Muscle weakness (generalized): Secondary | ICD-10-CM | POA: Diagnosis not present

## 2018-06-05 DIAGNOSIS — D72829 Elevated white blood cell count, unspecified: Secondary | ICD-10-CM | POA: Diagnosis not present

## 2018-06-05 DIAGNOSIS — M2550 Pain in unspecified joint: Secondary | ICD-10-CM | POA: Diagnosis not present

## 2018-06-05 DIAGNOSIS — I10 Essential (primary) hypertension: Secondary | ICD-10-CM | POA: Diagnosis not present

## 2018-06-05 LAB — CULTURE, BLOOD (ROUTINE X 2)
Culture: NO GROWTH
Special Requests: ADEQUATE

## 2018-06-05 LAB — GLUCOSE, CAPILLARY
GLUCOSE-CAPILLARY: 149 mg/dL — AB (ref 70–99)
GLUCOSE-CAPILLARY: 138 mg/dL — AB (ref 70–99)
Glucose-Capillary: 184 mg/dL — ABNORMAL HIGH (ref 70–99)

## 2018-06-05 NOTE — NC FL2 (Signed)
[place] LEVEL OF CARE SCREENING TOOL     IDENTIFICATION  Patient Name: Mariah [name] Birthdate: [birth year] Sex: female Admission Date (Current Location): 05/31/2018  [facility] and [place] Number:  Herbalist and Address:  [facility] [place], [place] [address], [place],  [address]      Provider Number: [id number]  Attending Physician Name and Address:  Axel Filler, *  Relative Name and Phone Number:  Sharl Ma niece, [phone]    Current Level of Care: Hospital Recommended Level of Care: [place] Prior Approval Number:    Date Approved/Denied:   PASRR Number: [id number]  Discharge Plan: SNF    Current Diagnoses: Patient Active Problem List   Diagnosis Date Noted  . Altered mental status   . AKI (acute kidney injury) ([place]) 06/02/2018  . Cellulitis of leg, left 06/01/2018  . 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 Vaginal bleeding 11/06/2017  . Acute encephalopathy 10/30/2017  . Postmenopausal bleeding 09/01/2017  . Screening for colon cancer 09/01/2017  . Thyroid nodule 09/01/2017  . Daytime sleepiness 08/15/2017  . Bilateral knee pain 08/15/2017  . Fall 08/15/2017  . Screening for breast cancer 05/25/2017  . NAFLD (nonalcoholic fatty liver disease) 05/24/2017  . Morbid obesity ([place]) 10/21/2012  . Type 2 diabetes mellitus treated without insulin ([place]) 09/18/2012  . Chronic venous stasis dermatitis of both lower extremities 07/10/2008  . Hypertension associated with diabetes ([place]) 04/26/2007  . Allergic rhinitis 04/26/2007  . Asthma 04/26/2007    Orientation RESPIRATION BLADDER Height & Weight     Self, Place  Normal Incontinent, External catheter Weight: 86.9 kg Height:  5\' 1"  (154.9 cm)  BEHAVIORAL SYMPTOMS/MOOD NEUROLOGICAL BOWEL NUTRITION STATUS      Incontinent Diet(Please see DC Summary)  AMBULATORY  STATUS COMMUNICATION OF NEEDS Skin   Extensive Assist Verbally Normal                       Personal Care Assistance Level of Assistance  Bathing, Dressing, Feeding Bathing Assistance: Maximum assistance Feeding assistance: Independent Dressing Assistance: Limited assistance     Functional Limitations Info  Sight, Hearing, Speech Sight Info: Adequate Hearing Info: Adequate Speech Info: Adequate    SPECIAL CARE FACTORS FREQUENCY  PT (By licensed PT), OT (By licensed OT)     PT Frequency: 5x/week OT Frequency: 3x/week            Contractures Contractures Info: Not present    Additional Factors Info  Code Status, Allergies, Insulin Sliding Scale Code Status Info: Full Allergies Info: Allergies:  Penicillins, Codeine, Indomethacin   Insulin Sliding Scale Info: 3x daily with meals and at bedtime       Current Medications (06/05/2018):  This is the current hospital active medication list Current Facility-Administered Medications  Medication Dose Route Frequency Provider Last Rate Last Dose  . acetaminophen (TYLENOL) tablet 650 mg  650 mg Oral Q6H PRN [name], MD   650 mg at 06/04/18 1700   Or  . acetaminophen (TYLENOL) suppository 650 mg  650 mg Rectal Q6H PRN [name], MD      . albuterol (PROVENTIL) (2.5 MG/3ML) 0.083% nebulizer solution 3 mL  3 mL Inhalation Q4H PRN Axel Filler, MD      . amLODipine (NORVASC) tablet 10 mg  10 mg Oral Daily [name], MD   10 mg at 06/05/18 0930  .  enoxaparin (LOVENOX) injection 40 mg  40 mg Subcutaneous Q24H Welford Roche, MD   40 mg at 06/05/18 0929  . fluticasone furoate-vilanterol (BREO ELLIPTA) 100-25 MCG/INH 1 puff  1 puff Inhalation Daily [name], MD   1 puff at 06/05/18 0824  . furosemide (LASIX) tablet 40 mg  40 mg Oral Daily Welford Roche, MD   40 mg at 06/05/18 0931  . insulin aspart (novoLOG) injection 0-15 Units  0-15 Units Subcutaneous TID WC [name], MD   2 Units  at 06/05/18 0851  . insulin aspart (novoLOG) injection 0-5 Units  0-5 Units Subcutaneous QHS [name], MD      . levETIRAcetam (KEPPRA) tablet 500 mg  500 mg Oral BID [name], MD   500 mg at 06/05/18 0930  . lisinopril (PRINIVIL,ZESTRIL) tablet 40 mg  40 mg Oral Daily [name], MD   40 mg at 06/05/18 0932  . polyethylene glycol (MIRALAX / GLYCOLAX) packet 17 g  17 g Oral Daily PRN [name], MD      . sodium chloride flush (NS) 0.9 % injection 3 mL  3 mL Intravenous Q12H [name], MD   3 mL at 06/05/18 0931     Discharge Medications: Please see discharge summary for a list of discharge medications.  Relevant Imaging Results:  Relevant Lab Results:   Additional Information SSN: [place],

## 2018-06-05 NOTE — Progress Notes (Signed)
Patient will DC to: [facility] Anticipated DC date: 06/05/18 Family notified: [name] and Sharl Ma Transport by: Corey Harold   Per MD patient ready for DC to [facility]. RN, patient, patient's family, and facility notified of DC. Discharge Summary and FL2 sent to facility. RN to call report prior to discharge [phone]). DC packet on chart. Ambulance transport requested for patient.   CSW will sign off for now as social work intervention is no longer needed. Please consult [place] again if new needs arise.  Cedric Fishman, LCSW Clinical Social Worker [phone]

## 2018-06-05 NOTE — Care Management Important Message (Signed)
Important Message  Patient Details  Name: Mariah [name] MRN: [id number] Date of Birth: 12-15-1950   Medicare Important Message Given:  Yes    [name] [name] 06/05/2018, 9:52 AM

## 2018-06-05 NOTE — Clinical Social Work Note (Signed)
Clinical Social Work Assessment  Patient Details  Name: Mariah [name] MRN: [id number] Date of Birth: 25-Apr-1951  Date of referral:  06/05/18               Reason for consult:  Facility Placement                Permission sought to share information with:  Facility Sport and exercise psychologist, Family Supports Permission granted to share information::  Yes, Verbal Permission Granted  Name::     Sport and exercise psychologist::  SNFs  Relationship::  Niece  Contact Information:  [phone]  Housing/Transportation Living arrangements for the past 2 months:  Single Family Home Source of Information:  Other (Comment Required), Friend/Neighbor(Niece) Patient Interpreter Needed:  None Criminal Activity/Legal Involvement Pertinent to Current Situation/Hospitalization:  No - Comment as needed Significant Relationships:  Other Family Members, Friend Lives with:  Self Do you feel safe going back to the place where you live?  No Need for family participation in patient care:  Yes (Comment)  Care giving concerns:  CSW received consult for possible SNF placement at time of discharge. CSW spoke with patient's niece regarding PT recommendation of SNF placement at time of discharge. Patient's niece reported that she and her mother have been concerned about patient living alone. She repots that her cognition has gotten worse. She expressed understanding of PT recommendation and is agreeable to SNF placement at time of discharge. CSW to continue to follow and assist with discharge planning needs.   Social Worker assessment / plan:  CSW spoke with patient's niece concerning possibility of rehab at [facility] before returning home.  Employment status:  Retired Forensic scientist:  Information systems manager, Medicaid In [employer] PT Recommendations:  [place] / Referral to community resources:  [place]  Patient/Family's Response to care:  Patient's niece recognizes need for rehab before returning home  and is agreeable to a SNF in [place]. Patient's niece reported preference for [facility] since patient was there earlier this year. She and her mother will help sign patient in to the SNF since patient is still intermittently confused.  Patient/Family's Understanding of and Emotional Response to Diagnosis, Current Treatment, and Prognosis:  Patient/family is realistic regarding therapy needs and expressed being hopeful for SNF placement. Patient's niece expressed understanding of CSW role and discharge process as well as medical condition. No questions/concerns about plan or treatment.    Emotional Assessment Appearance:  Appears stated age Attitude/Demeanor/Rapport:  Unable to Assess Affect (typically observed):  Unable to Assess Orientation:  Oriented to Self, Oriented to Place(Talks about her dog as though it is still living) Alcohol / Substance use:  Not Applicable Psych involvement (Current and /or in the community):  No (Comment)  Discharge Needs  Concerns to be addressed:  Care Coordination Readmission within the last 30 days:  No Current discharge risk:  Cognitively Impaired, Lives alone Barriers to Discharge:  No Barriers Identified   Benard Halsted, LCSW 06/05/2018, 11:17 AM

## 2018-06-05 NOTE — Progress Notes (Signed)
   Subjective: No overnight events. Mariah [name] was seen and evaluated at the bedside this morning. She was somnolent on exam but able to answer questions appropriately. She denied any pain in her legs. She understood we were waiting on SNF placement.   Objective:  Vital signs in last 24 hours: Vitals:   06/04/18 1437 06/04/18 2121 06/05/18 0556 06/05/18 0828  BP: (!) 167/85 138/83 (!) 153/83   Pulse: (!) 112 98 97   Resp: 18 (!) 32 (!) 32   Temp: 99.6 F (37.6 C) 99.8 F (37.7 C) 98.4 F (36.9 C)   TempSrc:      SpO2: 91% 97% 96% 97%  Weight:   86.9 kg   Height:       Physical Exam  Constitutional: No distress.  Musculoskeletal: She exhibits tenderness. She exhibits no edema.  Skin: Skin is warm and dry. She is not diaphoretic. No erythema.  Left LE lesions dry and clean   Assessment/Plan:  Principal Problem:   Cellulitis of leg, left Active Problems:   Chronic venous stasis dermatitis of both lower extremities   Acute encephalopathy   AKI (acute kidney injury) ([place])   Altered mental status  Acute encephalopathy 2/2 LLE cellulitis  Mariah [name] somnolent on exam but arousable and still able to appropriately answer questions. Afebrile and hemodynamically stable. She is medically stable for discharge to SNF.    Dispo: Patient is medically stable for discharge today to SNF.  Rehman, Areeg N, DO 06/05/2018, 1:13 PM Pager: [phone]

## 2018-06-05 NOTE — Progress Notes (Signed)
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    PT Comments    Pt making slow progress with basic mobility.  Emphasis today on transition to sitting EOB and working on sitting balance with assist in midline and pt recovering to midline against minimal resistance.     Follow Up Recommendations  SNF;Supervision/Assistance - 24 hour     Equipment Recommendations  None recommended by PT    Recommendations for Other Services       Precautions / Restrictions Precautions Precautions: Fall    Mobility  Bed Mobility Overal bed mobility: Needs Assistance Bed Mobility: Supine to Sit;Sit to Supine     Supine to sit: Max assist Sit to supine: Total assist;+2 for physical assistance      Transfers                 General transfer comment: pt deferred and she is to be transported to [facility] soon.  Ambulation/Gait                 Stairs             Wheelchair Mobility    Modified Rankin (Stroke Patients Only)       Balance Overall balance assessment: Needs assistance Sitting-balance support: Single extremity supported;No upper extremity supported Sitting balance-Leahy Scale: Fair(initially unable to sit upright, then able to move into/out ) Sitting balance - Comments: able to sit upright without UE once upright with assist for a few minutes.                                    Cognition Arousal/Alertness: Lethargic;Awake/alert Behavior During Therapy: WFL for tasks assessed/performed Overall Cognitive Status:  (still confused, but answering directed questions)                                        Exercises Other Exercises Other Exercises: Hip/knee flexion extention AAROM was too painful in supine so quit in lieu of sitting EOB.    General Comments General comments (skin integrity, edema, etc.): pt unable to kick L LE out (into LAQ), but was able to swing R LE into small arches.      Pertinent Vitals/Pain Pain Assessment: Faces Faces Pain Scale: Hurts little more Pain Location: bil LE Pain Descriptors / Indicators: Grimacing;Guarding Pain Intervention(s): Monitored during session    Home Living                      Prior Function            PT Goals (current goals can now be found in the care plan section) Acute Rehab PT Goals PT Goal Formulation: Patient unable to participate in goal setting Time For Goal Achievement: 06/15/18 Potential to Achieve Goals: Fair Progress towards PT goals: Progressing toward goals    Frequency    Min 3X/week      PT Plan Current plan remains appropriate    Co-evaluation  AM-PAC PT "6 Clicks" Daily Activity  Outcome Measure  Difficulty turning over in bed (including adjusting bedclothes, sheets and blankets)?: Unable Difficulty moving from lying on back to sitting on the side of the bed? : Unable Difficulty sitting down on and standing up from a chair with arms (e.g., wheelchair, bedside commode, etc,.)?: Unable Help needed moving to and from a bed to chair (including a wheelchair)?: Total Help needed walking in hospital room?: Total Help needed climbing 3-5 steps with a railing? : Total 6 Click Score: 6    End of Session   Activity Tolerance: Patient tolerated treatment well;Patient limited by fatigue Patient left: in bed;with call bell/phone within reach;with bed alarm set Nurse Communication: Mobility status;Need for lift equipment PT Visit Diagnosis: Muscle weakness (generalized)  (M62.81);Pain;Adult, failure to thrive (R62.7) Pain - part of body: (legs)     Time: 0923-3007 PT Time Calculation (min) (ACUTE ONLY): 25 min  Charges:  $Therapeutic Activity: 23-37 mins                     06/05/2018  Mariah [name], PT Acute Rehabilitation Services [phone]  (pager) [phone]  (office)   Mariah [name] Mariah [name] 06/05/2018, 4:51 PM

## 2018-06-05 NOTE — Progress Notes (Signed)
Pt for discharge today going to [facility], report given to [place] waiting for [place] for transport.

## 2018-06-05 NOTE — Clinical Social Work Placement (Signed)
   CLINICAL SOCIAL WORK PLACEMENT  NOTE  Date:  06/05/2018  Patient Details  Name: Mariah [name] MRN: [id number] Date of Birth: 1951-06-24  Clinical Social Work is seeking post-discharge placement for this patient at the [place] level of care (*CSW will initial, date and re-position this form in  chart as items are completed):  Yes   Patient/family provided with [place] Work Department's list of facilities offering this level of care within the geographic area requested by the patient (or if unable, by the patient's family).  Yes   Patient/family informed of their freedom to choose among providers that offer the needed level of care, that participate in Medicare, Medicaid or managed care program needed by the patient, have an available bed and are willing to accept the patient.  Yes   Patient/family informed of [facility]'s ownership interest in [facility] and [facility], as well as of the fact that they are under no obligation to receive care at these facilities.  PASRR submitted to EDS on       PASRR number received on       Existing PASRR number confirmed on 06/05/18     FL2 transmitted to all facilities in geographic area requested by pt/family on 06/05/18     FL2 transmitted to all facilities within larger geographic area on       Patient informed that his/her managed care company has contracts with or will negotiate with certain facilities, including the following:        Yes   Patient/family informed of bed offers received.  Patient chooses bed at [facility] recommends and patient chooses bed at      Patient to be transferred to [facility] on 06/05/18.  Patient to be transferred to facility by [facility]     Patient family notified on 06/05/18 of transfer.  Name of family member notified:  [name], Sister     PHYSICIAN       Additional Comment:     _______________________________________________ Benard Halsted, LCSW 06/05/2018, 12:25 PM

## 2018-06-05 NOTE — Progress Notes (Signed)
Pt discharged going to [facility] SNF alert and responsive, no complain of pain given all her personal belongings, discontinued peripheral IV line, health teachings, next appointment, due med listed and report given to [facility], escorted by [facility], given 2 cellphone with charger, no s/s of distress noted.

## 2018-06-06 ENCOUNTER — Encounter: Payer: Self-pay | Admitting: Adult Health

## 2018-06-06 ENCOUNTER — Non-Acute Institutional Stay (SKILLED_NURSING_FACILITY): Payer: Medicare Other | Admitting: Adult Health

## 2018-06-06 DIAGNOSIS — L03116 Cellulitis of left lower limb: Secondary | ICD-10-CM

## 2018-06-06 DIAGNOSIS — G40909 Epilepsy, unspecified, not intractable, without status epilepticus: Secondary | ICD-10-CM | POA: Diagnosis not present

## 2018-06-06 DIAGNOSIS — E119 Type 2 diabetes mellitus without complications: Secondary | ICD-10-CM

## 2018-06-06 DIAGNOSIS — Z8742 Personal history of other diseases of the female genital tract: Secondary | ICD-10-CM

## 2018-06-06 DIAGNOSIS — G9340 Encephalopathy, unspecified: Secondary | ICD-10-CM | POA: Diagnosis not present

## 2018-06-06 DIAGNOSIS — J45909 Unspecified asthma, uncomplicated: Secondary | ICD-10-CM | POA: Diagnosis not present

## 2018-06-06 DIAGNOSIS — I10 Essential (primary) hypertension: Secondary | ICD-10-CM

## 2018-06-06 NOTE — Progress Notes (Signed)
Location:  [place] Room Number: 108-A Place of Service:  SNF (31) Provider:  Durenda Age, NP  Patient Care Team: [name], MD as PCP - General (Internal Medicine)  Extended Emergency Contact Information Primary Emergency Contact: Laresha, [place] Home Phone: [phone] Relation: Sister Secondary Emergency Contact: Sandria Manly Mobile Phone: [phone] Relation: Niece  Code Status:  Full Code  Goals of care: Advanced Directive information Advanced Directives 05/31/2018  Does Patient Have a Medical Advance Directive? No  Would patient like information on creating a medical advance directive? No - Patient declined  Pre-existing out of facility DNR order (yellow form or pink MOST form) -     Chief Complaint  Patient presents with  . Acute Visit    Patient seen for hospital followup, status post admission at [facility] 11/7-11/12 for acute encephalopathy.    HPI:  Pt is a 67 y.o. female seen today for hospital followup.  She was admitted to [facility] on 06/05/18 for short-term rehabilitation following an admission at [facility] 11/7-11/12 for acute encephalopathy. She has a PMH of essential hypertension, MASH, type 2 DM, history of migraines, GERD, and asthma. She was found by her neighbor sitting on her feces at home and somnolent. She is wheelchair-bound and was found on her knees. Her aunt reported that she had purulent drainage coming out of her left lower extremity. She was reported to have hallucinations for the past several days. 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 She was given IV fluids and started on Cefepime and Vancomycin then switched to oral Doxycycline for 3 days.Left lower extremity did not have further drainage. She has history of postmenopausal bleeding and has not followed with gynecologist.   She was seen in her room today. She was would open her eyes to verbal  request and would close them again. She does not answer coherently the verbal queries.   Past Medical History:  Diagnosis Date  . Allergic rhinitis   . Arthritis    "ankles, feet, knees" (10/30/2017)  . Asthma   . Bursitis   . Carpal tunnel syndrome   . GERD (gastroesophageal reflux disease)   . HTN (hypertension)   . Migraine    "a couple/yr" (10/30/2017)  . Obesity   . Peripheral vascular disease ([place])   . Pneumonia 1960s X 1  . Sciatica   . Tendinitis   . Type II diabetes mellitus (HCC)    Type II  . UTI (urinary tract infection)    Past Surgical History:  Procedure Laterality Date  . CRANIOTOMY N/A 01/19/2018   Procedure: ENDOSCOPIC TRANSPHENOIDAL RESECTION OF PITUITARY TUMOR;  Surgeon: Consuella Lose, MD;  Location: [facility];  Service: Neurosurgery;  Laterality: N/A;  ENDOSCOPIC TRANSPHENOIDAL RESECTION OF PITUITARY TUMOR  . GANGLION CYST EXCISION Left   . KNEE ARTHROSCOPY Left 2003  . PLANTAR FASCIA RELEASE Right 1995   "heel"  . SINUS ENDO WITH FUSION N/A 01/19/2018   Procedure: SINUS ENDO WITH FUSION;  Surgeon: Jerrell Belfast, MD;  Location: [facility];  Service: ENT;  Laterality: N/A;  . TONSILLECTOMY  1958  . TRANSPHENOIDAL APPROACH EXPOSURE N/A 01/19/2018   Procedure: TRANSPHENOIDAL  RESECTION OF PITUITARY TUMOR;  Surgeon: Jerrell Belfast, MD;  Location: [facility];  Service: ENT;  Laterality: N/A;  . [place]; 1997   Dr [name]    Allergies  Allergen Reactions  . Penicillins Hives and Other (See Comments)    ++ got keflex in 2013  and 2019++ Has patient had a PCN reaction causing immediate rash, facial/tongue/throat swelling, SOB or lightheadedness with hypotension: No Has patient had a PCN reaction causing severe rash involving mucus membranes or skin necrosis: No PATIENT HAS HAD A PCN REACTION THAT REQUIRED HOSPITALIZATION:  #  #  YES  #  #  Has patient had a PCN reaction occurring within the last 10 years: No   . Codeine Nausea And Vomiting  .  Indomethacin Diarrhea    Outpatient Encounter Medications as of 06/06/2018  Medication Sig  . albuterol (PROVENTIL HFA;VENTOLIN HFA) 108 (90 Base) MCG/ACT inhaler Inhale 2 puffs into the lungs every 6 (six) hours as needed for wheezing or shortness of breath.  [name] amLODipine (NORVASC) 10 MG tablet Take 1 tablet (10 mg total) by mouth daily. PATIENT MUST BE SEEN FOR ADDITIONAL REFILLS  . esomeprazole (NEXIUM) 20 MG capsule Take 1 capsule (20 mg total) by mouth every morning. For acid reflex  . Fluticasone-Salmeterol (ADVAIR) 100-50 MCG/DOSE AEPB Inhale 1 puff into the lungs 2 (two) times daily.  . furosemide (LASIX) 40 MG tablet TAKE ONE TABLET BY MOUTH EVERY DAY  . glipiZIDE (GLUCOTROL XL) 10 MG 24 hr tablet TAKE TWO TABLETS BY MOUTH EVERY DAY  . JANUMET 50-1000 MG tablet TAKE ONE TABLET BY MOUTH TWICE DAILY WITH MEALS  . levETIRAcetam (KEPPRA) 500 MG tablet Take 1 tablet (500 mg total) by mouth 2 (two) times daily.  [name] lisinopril (PRINIVIL,ZESTRIL) 40 MG tablet Take 40 mg by mouth daily.   . mometasone (NASONEX) 50 MCG/ACT nasal spray Place 1 spray into the nose as needed.  . naproxen (NAPROSYN) 500 MG tablet Take 1 tablet (500 mg total) by mouth 2 (two) times daily with a meal.  . [DISCONTINUED] HYDROcodone-acetaminophen (NORCO/VICODIN) 5-325 MG tablet Take 1 tablet by mouth every 4 (four) hours as needed for moderate pain.   No facility-administered encounter medications on file as of 06/06/2018.     Review of Systems  Unable to obtain due to somnolence    Immunization History  Administered Date(s) Administered  . Influenza Split 03/08/2013  . Influenza Whole 09/03/2009, 06/03/2010  . Influenza,inj,Quad PF,6+ Mos 06/25/2014  . Influenza-Unspecified 06/08/2017  . Pneumococcal Polysaccharide-23 07/25/2002, 10/31/2017  . Td 07/25/2005   Pertinent  Health Maintenance Due  Topic Date Due  . OPHTHALMOLOGY EXAM  09/02/1962  . PAP SMEAR  09/02/1973  . COLONOSCOPY  09/02/2002  .  MAMMOGRAM  01/14/2017  . DEXA SCAN  09/02/2017  . INFLUENZA VACCINE  02/22/2018  . FOOT EXAM  09/01/2018  . HEMOGLOBIN A1C  09/01/2018  . PNA vac Low Risk Adult (2 of 2 - PCV13) 11/01/2018   Fall Risk  03/06/2018 10/20/2017 09/01/2017 08/15/2017 07/28/2017  Falls in the past year? Yes Yes Yes Yes Yes  Number falls in past yr: 1 2 or more 2 or more 1 2 or more  Comment - - 7 times  - -  Injury with Fall? Yes Yes No No Yes  Comment - Broken jaw - - -  Risk Factor Category  High Fall Risk High Fall Risk High Fall Risk High Fall Risk High Fall Risk  Risk for fall due to : Impaired balance/gait History of fall(s);Impaired mobility;Impaired balance/gait Impaired balance/gait;Impaired mobility Impaired balance/gait;Impaired mobility History of fall(s);Medication side effect;Impaired mobility  Follow up Falls prevention discussed - Falls prevention discussed Education provided Falls prevention discussed      Vitals:   06/06/18 0850 06/06/18 0854  BP: (!) 153/83 (!) 153/80  Pulse: 87 (!) 104  Resp: 18   Temp: 97.7 F (36.5 C)   TempSrc: Oral   SpO2: 97%   Weight: 191 lb 9.3 oz (86.9 kg)   Height: 5\' 1"  (1.549 m)    Body mass index is 36.2 kg/m.  Physical Exam  GENERAL APPEARANCE: Well nourished. In no acute distress. Obese SKIN:  Lesion on BLE MOUTH and THROAT: Lips are without lesions. Oral mucosa is moist and without lesions. RESPIRATORY: Breathing is even & unlabored, BS CTAB CARDIAC: RRR, no murmur,no extra heart sounds, no edema GI: Abdomen soft, normal BS, no masses, no tenderness NEUROLOGICAL: There is no tremor. Speech is clear. Somnolent. PSYCHIATRIC:  Affect and behavior are appropriate   Labs reviewed: Recent Labs    06/02/18 0356 06/03/18 0548 06/04/18 0501  NA 137 136 136  K 3.4* 3.7 3.6  CL 106 104 104  CO2 23 25 25   GLUCOSE 104* 137* 126*  BUN 12 13 17   CREATININE 1.05* 1.09* 0.91  CALCIUM 8.1* 8.5* 8.6*   Recent Labs    11/07/17 1212 04/25/18 0635  05/31/18 1401  AST 23 18 34  ALT 20 13 17   ALKPHOS 76 67 62  BILITOT 0.7 0.4 0.6  PROT 8.5* 9.0* 9.1*  ALBUMIN 3.3* 3.2* 3.4*   Recent Labs    04/25/18 0635  06/01/18 0035 06/03/18 0548 06/04/18 0501  WBC 13.7*   < > 12.9* 13.5* 13.7*  NEUTROABS 10.7*  --  9.6*  --   --   HGB 11.5*   < > 12.0 10.5* 11.3*  HCT 37.8   < > 37.9 34.1* 35.8*  MCV 92.6   < > 90.2 90.2 89.5  PLT 397   < > 395 424* 415*   < > = values in this interval not displayed.   Lab Results  Component Value Date   TSH 2.749 10/31/2017   Lab Results  Component Value Date   HGBA1C 6.1 (H) 06/01/2018   Lab Results  Component Value Date   CHOL 178 08/30/2012   HDL 49 08/30/2012   LDLCALC 92 08/30/2012   TRIG 187 (H) 08/30/2012   CHOLHDL 3.6 08/30/2012    Significant Diagnostic Results in last 30 days:  Ct Head Wo Contrast  Result Date: 05/31/2018 CLINICAL DATA:  Altered level of consciousness.  Unwitnessed fall. EXAM: CT HEAD WITHOUT CONTRAST TECHNIQUE: Contiguous axial images were obtained from the base of the skull through the vertex without intravenous contrast. COMPARISON:  CT scan of October 30, 2017.  MRI of November 01, 2017. FINDINGS: Brain: Mild chronic ischemic white matter disease is noted. No mass effect or midline shift is noted. Ventricular size is within normal limits. There is no evidence of hemorrhage or infarction. 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 Vascular: No hyperdense vessel or unexpected calcification. Skull: Normal. Negative for fracture or focal lesion. Sinuses/Orbits: Left frontal and ethmoid sinusitis is noted. Other: None. IMPRESSION: Mild chronic ischemic white matter disease. 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 Left frontal and ethmoid sinusitis. No other significant intracranial abnormality seen. Electronically Signed   By: Marijo Conception, M.D.   On: 05/31/2018  16:12   Dg Chest Port 1 View  Result Date: 05/31/2018 CLINICAL DATA:  Unwitnessed fall. Husband found wife learning over a chair on her knees. Alert and oriented to event/ Pt states she tripped and fell ( mechanical fall) .  Pt states about an hour. No trauma noted. During transport pt presents with increased confusion and lethargy. EXAM: PORTABLE CHEST 1 VIEW COMPARISON:  10/30/2017 FINDINGS: Cardiac silhouette is normal in size. No mediastinal or hilar masses. No evidence of adenopathy. Clear lungs.  No pleural effusion or pneumothorax. Skeletal structures are grossly intact. IMPRESSION: No active disease. Electronically Signed   By: Lajean Manes M.D.   On: 05/31/2018 15:17    Assessment/Plan  1. Encephalopathy - secondary to left lower extremity cellulitis associated with chronic venous stasis, she was started on Cefepime and Vancomycin then switched to oral Doxycycline for 3 days, left lower extremity lesion is dry, she is currently somnolent and disoriented X 3, will discontinue Norco   2. Cellulitis of left lower extremity -  was started on Cefepime and Vancomycin then switched to oral Doxycycline for 3 days, will refer to dermatology for the BLE lesions   3. History of postmenopausal bleeding -  No active bleeding noted, will refer to gynecology   4. Seizure disorder ([place]) - no recent seizures, will continue Keppra 500 mg 1 tab twice a day   5. Essential hypertension   -continue lisinopril 40 mg 1 tab daily, Lasix 40 mg 1 tab daily and Norvasc 10 mg 1 tab daily   6. Type 2 diabetes mellitus treated without insulin (HCC) - continue glipizide XL 10 mg 1 tab daily, Janumet 50-1000 mg 1 tab twice a day, CBG BID Lab Results  Component Value Date   HGBA1C 6.1 (H) 06/01/2018    7. Uncomplicated asthma, unspecified asthma severity, unspecified whether persistent - no wheezing, continue Proventil HFA 90 mcg inhaler administered 2 puffs into the lungs every 6 hours as needed   Family/  staff Communication: Discussed plan of care with patient and charge nurse.  Labs/tests ordered:  CBC and BMP  Goals of care:   Short-term rehabilitation.   Durenda Age, NP [facility] [phone] (Monday-Friday 8:00 a.m. - 5:00 p.m.) [phone] (after hours)

## 2018-06-07 ENCOUNTER — Non-Acute Institutional Stay (SKILLED_NURSING_FACILITY): Payer: Medicare Other | Admitting: Internal Medicine

## 2018-06-07 ENCOUNTER — Encounter: Payer: Self-pay | Admitting: *Deleted

## 2018-06-07 ENCOUNTER — Encounter: Payer: Self-pay | Admitting: Internal Medicine

## 2018-06-07 DIAGNOSIS — N950 Postmenopausal bleeding: Secondary | ICD-10-CM

## 2018-06-07 DIAGNOSIS — E119 Type 2 diabetes mellitus without complications: Secondary | ICD-10-CM | POA: Diagnosis not present

## 2018-06-07 DIAGNOSIS — D72829 Elevated white blood cell count, unspecified: Secondary | ICD-10-CM | POA: Diagnosis not present

## 2018-06-07 DIAGNOSIS — I872 Venous insufficiency (chronic) (peripheral): Secondary | ICD-10-CM | POA: Diagnosis not present

## 2018-06-07 DIAGNOSIS — G9340 Encephalopathy, unspecified: Secondary | ICD-10-CM | POA: Diagnosis not present

## 2018-06-07 LAB — CBC AND DIFFERENTIAL
HCT: 38 (ref 36–46)
HEMOGLOBIN: 12.6 (ref 12.0–16.0)
Neutrophils Absolute: 14
Platelets: 433 — AB (ref 150–399)
WBC: 16.9

## 2018-06-07 LAB — BASIC METABOLIC PANEL
BUN: 25 — AB (ref 4–21)
CREATININE: 0.8 (ref 0.5–1.1)
Glucose: 68
Potassium: 4.3 (ref 3.4–5.3)
SODIUM: 140 (ref 137–147)

## 2018-06-07 NOTE — Assessment & Plan Note (Addendum)
Clinical appearance of sclerotic changes from chronic venous insufficiency and stasis ie, "elephantiasis" Dermatology consultation as outpatient as per PCP

## 2018-06-07 NOTE — Patient Instructions (Signed)
See assessment and plan under each diagnosis in the problem list and acutely for this visit 

## 2018-06-07 NOTE — Assessment & Plan Note (Addendum)
SLUMS testing indicated to assess likely underlying neurocognitive deficit Avoid high risk medications on [place]

## 2018-06-07 NOTE — Assessment & Plan Note (Addendum)
The white count is chronically elevated; monitor for any signs or symptoms of infection such as fever, sputum production, nasal discharge,pain with urination , diarrhea, etc.

## 2018-06-07 NOTE — Assessment & Plan Note (Addendum)
PCP attempting to arrange gynecologic follow-up 06/07/2018 hemoglobin 12.6/hematocrit 37.7 with normochromic, normocytic indices

## 2018-06-07 NOTE — Progress Notes (Signed)
NURSING HOME LOCATION:  [facility] ROOM NUMBER:  108-A  CODE STATUS:  Full Code  PCP:  [name], MD  [place] [address]  This is a comprehensive admission note to [facility] performed on this date less than 30 days from date of admission. Included are preadmission medical/surgical history; reconciled medication list; family history; social history and comprehensive review of systems.  Corrections and additions to the records were documented. Comprehensive physical exam was also performed. Additionally a clinical summary was entered for each active diagnosis pertinent to this admission in the Problem List to enhance continuity of care.  HPI: Patient was hospitalized 11/7-11/12/19 with diagnoses of acute encephalopathy in the context of left lower extremity cellulitis and chronic venous stasis dermatitis.  The patient was seen in ED after neighbor found her on the floor sitting in her own feces.  The patient is wheelchair-bound.  She was described as somnolent but oriented.  Her history revealed that over the prior 2 weeks she had multiple ground-level falls in the context of lower extremity pain.  She also reported recent purulent drainage from the left lower extremity venous stasis sites.  She denied associated fevers or chills.  A relative reported that she has actually been confused with hallucinations over several days prior to the admission, talking about her deceased parent and deceased pet. In the ED she had a rectal temperature of 100 with tachypnea and leukocytosis of 15,200.  CMP revealed AKI with elevated BUN/creatinine.  CT of the head revealed a stable sellar and suprasellar mass consistent with macroadenoma. Dual antibiotics were empirically started and the patient received 1 L bolus saline.  Blood cultures grew coag negative staph in 1 of 4 bottles, this was felt to be a contaminant.  She was switched to oral doxycycline for a total of 3 additional  days.  The lower extremity lesions ceased to drain.  Outpatient dermatology evaluation was recommended for recurrence or progression. AKI was resolved with creatinine dropping from 1.59 at admission to 0.91. The patient gave a history of ongoing postmenopausal bleeding with pain.  According to records this has been a chronic issue but the patient has not complied with the [place] referrals.  She has had uterine fibroid surgery on 2 occasions.  Her gynecologist is deceased.  There is a history of uterine cancer in her mother.  Past medical and surgical history: Includes asthma, hypertension, GERD, CTS,DM, extrinsic rhinitis and seizure disorder. Craniotomy via transsphenoidal approach was performed 01/19/2018.  Social history: Nondrinker, never smoked  Family history: Reviewed   Review of systems:  Could not be completed due to neurocognitive deficit and somnolence . The date given as "ninth, eighth, 22".  When asked about recurrent falls she replied "not too much but been trapped under something".  Physical exam:  Pertinent or positive findings: As noted she is markedly somnolent.  Her responses are nonsensical as noted.  She has exotropia of the right eye.  The eyebrows are thin laterally.  She has mild, low-grade rhonchi in the right chest.  There is a slight resting gallop type cadence.  Abdomen is protuberant.  Pedal pulses are surprisingly strong.  She has weakness to opposition greater in the lower extremities than the upper extremities.  Thickened, hyperpigmented sclerotic changes are noted over the shins with a "elephantiasis" type appearance.  General appearance: Adequately nourished; no acute distress, increased work of breathing is present.   Lymphatic: No lymphadenopathy about the head, neck, axilla. Eyes:  No conjunctival inflammation or lid edema is present. There is no scleral icterus. Ears:  External ear exam shows no significant lesions or deformities.   Nose:  External nasal  examination shows no deformity or inflammation. Nasal mucosa are pink and moist without lesions, exudates Oral exam: Lips and gums are healthy appearing.There is no oropharyngeal erythema or exudate. Neck:  No thyromegaly, masses, tenderness noted.    Abdomen: Bowel sounds are normal.  Abdomen is soft and nontender with no organomegaly, hernias, masses. GU: Deferred  Extremities:  No cyanosis, clubbing. Neurologic exam:  Balance, Rhomberg, finger to nose testing could not be completed due to clinical state Skin: Warm & dry w/o tenting. No significant  rash.  See clinical summary under each active problem in the Problem List with associated updated therapeutic plan

## 2018-06-07 NOTE — Assessment & Plan Note (Addendum)
A1c 6.1% on 06/01/2018, prediabetic Single fasting glucose here 89 Decrease glipizide ER to 1 pill daily to prevent hypoglycemia

## 2018-06-14 ENCOUNTER — Non-Acute Institutional Stay (SKILLED_NURSING_FACILITY): Payer: Medicare Other | Admitting: Adult Health

## 2018-06-14 ENCOUNTER — Encounter: Payer: Self-pay | Admitting: Adult Health

## 2018-06-14 DIAGNOSIS — E162 Hypoglycemia, unspecified: Secondary | ICD-10-CM

## 2018-06-14 DIAGNOSIS — E119 Type 2 diabetes mellitus without complications: Secondary | ICD-10-CM

## 2018-06-14 DIAGNOSIS — R400 Somnolence: Secondary | ICD-10-CM | POA: Diagnosis not present

## 2018-06-14 DIAGNOSIS — E161 Other hypoglycemia: Secondary | ICD-10-CM | POA: Diagnosis not present

## 2018-06-14 NOTE — Progress Notes (Signed)
Location:  [place] Room Number: 108-A Place of Service:  SNF (31) Provider:  Durenda Age, NP  Patient Care Team: [name], MD as PCP - General (Internal Medicine)  Extended Emergency Contact Information Primary Emergency Contact: Zanita, [place] Home Phone: [phone] Relation: Sister Secondary Emergency Contact: Sandria Manly Mobile Phone: [phone] Relation: Niece  Code Status:  Full Code  Goals of care: Advanced Directive information Advanced Directives 05/31/2018  Does Patient Have a Medical Advance Directive? No  Would patient like information on creating a medical advance directive? No - Patient declined  Pre-existing out of facility DNR order (yellow form or pink MOST form) -     Chief Complaint  Patient presents with  . Acute Visit    Patient with increased somnolence.    HPI:  Pt is a 67 y.o. female seen today for an acute visit secondary to somnolence.  She is a short-term rehabilitation resident of [facility].  She has a PMH of essential hypertension, migraines, GERD, and asthma. She was seen in her room today. She was noted by speech therapist to perseverate and confused. BIMS score was 5. CBG was taken, 92.   She was admitted to [facility] on 06/05/2018 from a recent hospitalization for acute encephalopathy.  She was found by her neighbor sitting on her feces at home and somnolent.  She is wheelchair-bound and was found on her knees.  Her aunt reported that she had purulent drainage coming out of her left lower extremity.  She was reported to have hallucinations for the past several days before hospitalization.  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  She was given IV fluids and started on cefepime and vancomycin then switched to oral doxycycline for 3 days.  Left lower extremity did not have further drainage.   Past Medical  History:  Diagnosis Date  . Allergic rhinitis   . Arthritis    "ankles, feet, knees" (10/30/2017)  . Asthma   . Bursitis   . Carpal tunnel syndrome   . GERD (gastroesophageal reflux disease)   . HTN (hypertension)   . Migraine    "a couple/yr" (10/30/2017)  . Obesity   . Peripheral vascular disease ([place])   . Pneumonia 1960s X 1  . Sciatica   . Tendinitis   . Type II diabetes mellitus (HCC)    Type II  . UTI (urinary tract infection)    Past Surgical History:  Procedure Laterality Date  . CRANIOTOMY N/A 01/19/2018   Procedure: ENDOSCOPIC TRANSPHENOIDAL RESECTION OF PITUITARY TUMOR;  Surgeon: Consuella Lose, MD;  Location: [place];  Service: Neurosurgery;  Laterality: N/A;  ENDOSCOPIC TRANSPHENOIDAL RESECTION OF PITUITARY TUMOR  . GANGLION CYST EXCISION Left   . KNEE ARTHROSCOPY Left 2003  . PLANTAR FASCIA RELEASE Right 1995   "heel"  . SINUS ENDO WITH FUSION N/A 01/19/2018   Procedure: SINUS ENDO WITH FUSION;  Surgeon: Jerrell Belfast, MD;  Location: [place];  Service: ENT;  Laterality: N/A;  . TONSILLECTOMY  1958  . TRANSPHENOIDAL APPROACH EXPOSURE N/A 01/19/2018   Procedure: TRANSPHENOIDAL  RESECTION OF PITUITARY TUMOR;  Surgeon: Jerrell Belfast, MD;  Location: [place];  Service: ENT;  Laterality: N/A;  . [place]; 1997   Dr [name]    Allergies  Allergen Reactions  . Penicillins Hives and Other (See Comments)    ++ got keflex in 2013 and 2019++ Has patient had a PCN reaction causing immediate rash,  facial/tongue/throat swelling, SOB or lightheadedness with hypotension: No Has patient had a PCN reaction causing severe rash involving mucus membranes or skin necrosis: No PATIENT HAS HAD A PCN REACTION THAT REQUIRED HOSPITALIZATION:  #  #  YES  #  #  Has patient had a PCN reaction occurring within the last 10 years: No   . Codeine Nausea And Vomiting  . Indomethacin Diarrhea    Outpatient Encounter Medications as of 06/14/2018  Medication Sig  .  acetaminophen (TYLENOL) 325 MG tablet Take 650 mg by mouth every 6 (six) hours as needed for mild pain or moderate pain.  [name] albuterol (PROVENTIL HFA;VENTOLIN HFA) 108 (90 Base) MCG/ACT inhaler Inhale 2 puffs into the lungs every 6 (six) hours as needed for wheezing or shortness of breath.  [name] amLODipine (NORVASC) 10 MG tablet Take 1 tablet (10 mg total) by mouth daily. PATIENT MUST BE SEEN FOR ADDITIONAL REFILLS  . esomeprazole (NEXIUM) 20 MG capsule Take 1 capsule (20 mg total) by mouth every morning. For acid reflex  . Fluticasone-Salmeterol (ADVAIR) 100-50 MCG/DOSE AEPB Inhale 1 puff into the lungs 2 (two) times daily.  . furosemide (LASIX) 40 MG tablet TAKE ONE TABLET BY MOUTH EVERY DAY  . glipiZIDE (GLUCOTROL) 10 MG tablet Take 10 mg by mouth daily before breakfast.  . JANUMET 50-1000 MG tablet TAKE ONE TABLET BY MOUTH TWICE DAILY WITH MEALS  . levETIRAcetam (KEPPRA) 500 MG tablet Take 1 tablet (500 mg total) by mouth 2 (two) times daily.  [name] lisinopril (PRINIVIL,ZESTRIL) 40 MG tablet Take 40 mg by mouth daily.   . mometasone (NASONEX) 50 MCG/ACT nasal spray Place 1 spray into the nose as needed.  . naproxen (NAPROSYN) 500 MG tablet Take 1 tablet (500 mg total) by mouth 2 (two) times daily with a meal.  . [DISCONTINUED] glipiZIDE (GLUCOTROL XL) 10 MG 24 hr tablet TAKE TWO TABLETS BY MOUTH EVERY DAY   No facility-administered encounter medications on file as of 06/14/2018.     Review of Systems  Unable to obtain due to somnolence    Immunization History  Administered Date(s) Administered  . Influenza Split 03/08/2013  . Influenza Whole 09/03/2009, 06/03/2010  . Influenza,inj,Quad PF,6+ Mos 06/25/2014  . Influenza-Unspecified 06/08/2017  . Pneumococcal Polysaccharide-23 07/25/2002, 10/31/2017  . Td 07/25/2005   Pertinent  Health Maintenance Due  Topic Date Due  . OPHTHALMOLOGY EXAM  09/02/1962  . PAP SMEAR  09/02/1973  . COLONOSCOPY  09/02/2002  . MAMMOGRAM  01/14/2017  . DEXA  SCAN  09/02/2017  . INFLUENZA VACCINE  02/22/2018  . FOOT EXAM  09/01/2018  . HEMOGLOBIN A1C  09/01/2018  . PNA vac Low Risk Adult (2 of 2 - PCV13) 11/01/2018   Fall Risk  03/06/2018 10/20/2017 09/01/2017 08/15/2017 07/28/2017  Falls in the past year? Yes Yes Yes Yes Yes  Number falls in past yr: 1 2 or more 2 or more 1 2 or more  Comment - - 7 times  - -  Injury with Fall? Yes Yes No No Yes  Comment - Broken jaw - - -  Risk Factor Category  High Fall Risk High Fall Risk High Fall Risk High Fall Risk High Fall Risk  Risk for fall due to : Impaired balance/gait History of fall(s);Impaired mobility;Impaired balance/gait Impaired balance/gait;Impaired mobility Impaired balance/gait;Impaired mobility History of fall(s);Medication side effect;Impaired mobility  Follow up Falls prevention discussed - Falls prevention discussed Education provided Falls prevention discussed     Vitals:   06/14/18 1445  BP: 128/71  Pulse: 95  Weight: 215 lb 6.4 oz (97.7 kg)  Height: 5\' 1"  (1.549 m)   Body mass index is 40.7 kg/m.  Physical Exam  GENERAL APPEARANCE: Well nourished. In no acute distress. Morbidly obese SKIN:  Lesion on BLE MOUTH and THROAT: Lips are without lesions. Oral mucosa is moist and without lesions.  RESPIRATORY: Breathing is even & unlabored, BS CTAB CARDIAC: RRR, no murmur,no extra heart sounds, no edema GI: Abdomen soft, normal BS, no masses, no tenderness  NEUROLOGICAL: There is no tremor. Speech is clear. Alert to self, disoriented to time and place PSYCHIATRIC:  Affect and behavior are appropriate  Labs reviewed: Recent Labs    06/02/18 0356 06/03/18 0548 06/04/18 0501 06/07/18  NA 137 136 136 140  K 3.4* 3.7 3.6 4.3  CL 106 104 104  --   CO2 23 25 25   --   GLUCOSE 104* 137* 126*  --   BUN 12 13 17  25*  CREATININE 1.05* 1.09* 0.91 0.8  CALCIUM 8.1* 8.5* 8.6*  --    Recent Labs    11/07/17 1212 04/25/18 0635 05/31/18 1401  AST 23 18 34  ALT 20 13 17   ALKPHOS 76  67 62  BILITOT 0.7 0.4 0.6  PROT 8.5* 9.0* 9.1*  ALBUMIN 3.3* 3.2* 3.4*   Recent Labs    04/25/18 0635  06/01/18 0035 06/03/18 0548 06/04/18 0501 06/07/18  WBC 13.7*   < > 12.9* 13.5* 13.7* 16.9  NEUTROABS 10.7*  --  9.6*  --   --  14  HGB 11.5*   < > 12.0 10.5* 11.3* 12.6  HCT 37.8   < > 37.9 34.1* 35.8* 38  MCV 92.6   < > 90.2 90.2 89.5  --   PLT 397   < > 395 424* 415* 433*   < > = values in this interval not displayed.   Lab Results  Component Value Date   TSH 2.749 10/31/2017   Lab Results  Component Value Date   HGBA1C 6.1 (H) 06/01/2018   Lab Results  Component Value Date   CHOL 178 08/30/2012   HDL 49 08/30/2012   LDLCALC 92 08/30/2012   TRIG 187 (H) 08/30/2012   CHOLHDL 3.6 08/30/2012    Significant Diagnostic Results in last 30 days:  Ct Head Wo Contrast  Result Date: 05/31/2018 CLINICAL DATA:  Altered level of consciousness.  Unwitnessed fall. EXAM: CT HEAD WITHOUT CONTRAST TECHNIQUE: Contiguous axial images were obtained from the base of the skull through the vertex without intravenous contrast. COMPARISON:  CT scan of October 30, 2017.  MRI of November 01, 2017. FINDINGS: Brain: Mild chronic ischemic white matter disease is noted. No mass effect or midline shift is noted. Ventricular size is within normal limits. There is no evidence of hemorrhage or infarction. 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 Vascular: No hyperdense vessel or unexpected calcification. Skull: Normal. Negative for fracture or focal lesion. Sinuses/Orbits: Left frontal and ethmoid sinusitis is noted. Other: None. IMPRESSION: Mild chronic ischemic white matter disease. 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 Left frontal and ethmoid sinusitis. No other significant intracranial abnormality seen. Electronically Signed   By: Marijo Conception, M.D.   On: 05/31/2018 16:12   Dg Chest  Port 1 View  Result Date: 05/31/2018 CLINICAL DATA:  Unwitnessed fall. Husband found wife learning over a chair on her knees. Alert and  oriented to event/ Pt states she tripped and fell ( mechanical fall) . Pt states about an hour. No trauma noted. During transport pt presents with increased confusion and lethargy. EXAM: PORTABLE CHEST 1 VIEW COMPARISON:  10/30/2017 FINDINGS: Cardiac silhouette is normal in size. No mediastinal or hilar masses. No evidence of adenopathy. Clear lungs.  No pleural effusion or pneumothorax. Skeletal structures are grossly intact. IMPRESSION: No active disease. Electronically Signed   By: Lajean Manes M.D.   On: 05/31/2018 15:17    Assessment/Plan  1. Hypoglycemia -  CBG 92, low, will discontinue Glipizide   2. Somnolence  - no reported fever, hypoglycemic (CBG 92), will check BMP to check for electrolyte imbalance and CBC to check for possible infection   3. Type 2 diabetes mellitus treated without insulin (HCC) - will discontinue Glipizide, continue Janumet 50-1,000 mg 1 tab BID, monitor CBG    Family/ staff Communication:   Labs/tests ordered: CBC, BMP, ammonia level and CBG  Goals of care:   Short-term rehabilitation.   Durenda Age, NP [facility] [phone] (Monday-Friday 8:00 a.m. - 5:00 p.m.) [phone] (after hours)

## 2018-06-22 ENCOUNTER — Other Ambulatory Visit: Payer: Self-pay | Admitting: Internal Medicine

## 2018-06-22 DIAGNOSIS — J45909 Unspecified asthma, uncomplicated: Secondary | ICD-10-CM

## 2018-06-22 IMAGING — DX DG ORTHOPANTOGRAM /PANORAMIC
2 series · 2 of 2 positions shown · non-contrast
Comparison: None.

CLINICAL DATA: Left jaw pain after hitting it on a door frame
during a fall this week.

EXAM:
ORTHOPANTOGRAM/PANORAMIC

[view not recorded (1 of 2)]
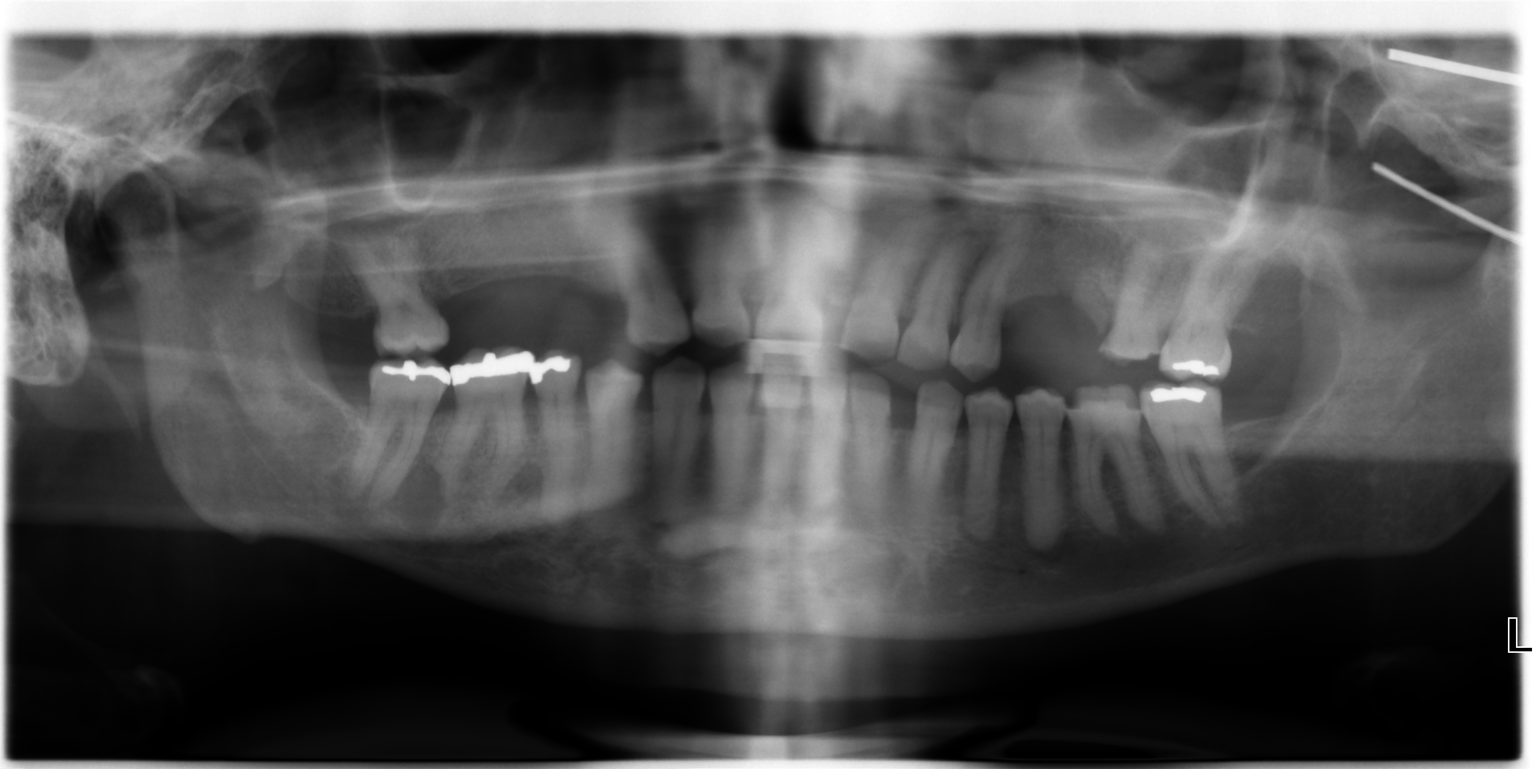

[view not recorded (2 of 2)]
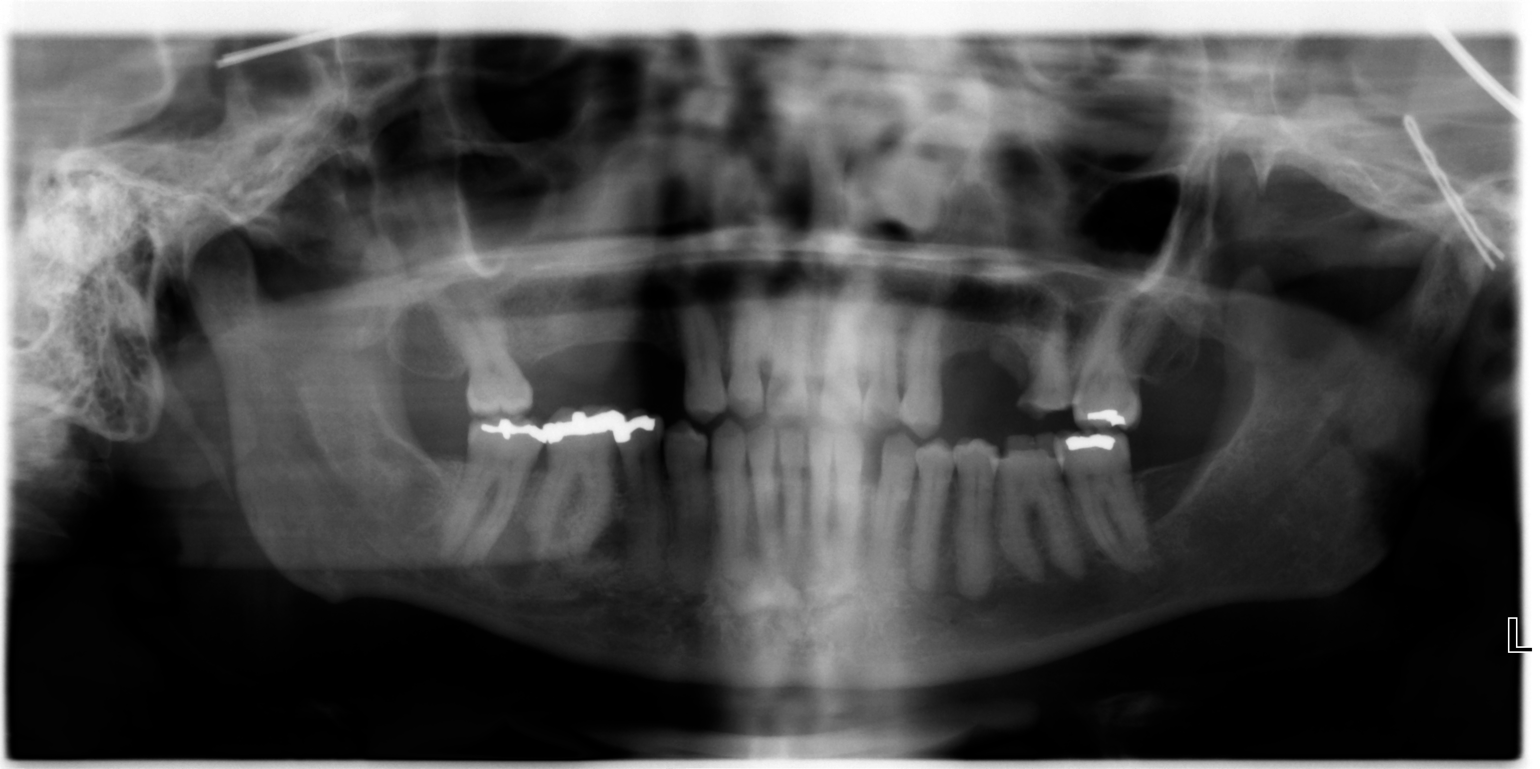

[2 of 2 positions shown; findings below may reference images not displayed]

FINDINGS: Lucency through the left mandibular ramus. Multiple missing
maxillary teeth. Dental amalgam. No periapical lucencies or dental
caries.
IMPRESSION: 1. Lucency through the left mandibular ramus may be related to
artifact, however a nondisplaced fracture is not excluded,
especially in the setting of left-sided jaw pain. Recommend CT
maxillofacial for further evaluation.

These results will be called to the ordering clinician or
representative by the Radiologist Assistant, and communication
documented in the PACS or zVision Dashboard.

## 2018-06-25 ENCOUNTER — Ambulatory Visit: Payer: Medicare Other | Admitting: Dietician

## 2018-06-25 ENCOUNTER — Encounter: Payer: Medicare Other | Admitting: Internal Medicine

## 2018-06-27 ENCOUNTER — Other Ambulatory Visit: Payer: Self-pay | Admitting: *Deleted

## 2018-06-27 DIAGNOSIS — K219 Gastro-esophageal reflux disease without esophagitis: Secondary | ICD-10-CM

## 2018-06-27 MED ORDER — ESOMEPRAZOLE MAGNESIUM 20 MG PO CPDR: 20.0000 mg | DELAYED_RELEASE_CAPSULE | Freq: Every morning | ORAL | 0 refills | Status: DC

## 2018-06-29 ENCOUNTER — Encounter: Payer: Self-pay | Admitting: Adult Health

## 2018-06-29 ENCOUNTER — Non-Acute Institutional Stay (SKILLED_NURSING_FACILITY): Payer: Medicare Other | Admitting: Adult Health

## 2018-06-29 DIAGNOSIS — E119 Type 2 diabetes mellitus without complications: Secondary | ICD-10-CM | POA: Diagnosis not present

## 2018-06-29 DIAGNOSIS — G40909 Epilepsy, unspecified, not intractable, without status epilepticus: Secondary | ICD-10-CM | POA: Diagnosis not present

## 2018-06-29 DIAGNOSIS — J45909 Unspecified asthma, uncomplicated: Secondary | ICD-10-CM | POA: Diagnosis not present

## 2018-06-29 DIAGNOSIS — J309 Allergic rhinitis, unspecified: Secondary | ICD-10-CM | POA: Diagnosis not present

## 2018-06-29 DIAGNOSIS — I10 Essential (primary) hypertension: Secondary | ICD-10-CM | POA: Diagnosis not present

## 2018-06-29 NOTE — Progress Notes (Signed)
Location:  [facility] Room Number: 108-A Place of Service:  SNF (31) Provider:  Durenda Age, NP  Patient Care Team: [name], MD as PCP - General (Internal Medicine)  Extended Emergency Contact Information Primary Emergency Contact: Cerenity, [place] Home Phone: [phone] Relation: Sister Secondary Emergency Contact: Sandria Manly Mobile Phone: [phone] Relation: Niece  Code Status:  Full Code  Goals of care: Advanced Directive information Advanced Directives 05/31/2018  Does Patient Have a Medical Advance Directive? No  Would patient like information on creating a medical advance directive? No - Patient declined  Pre-existing out of facility DNR order (yellow form or pink MOST form) -     Chief Complaint  Patient presents with  . Medical Management of Chronic Issues    Routine [facility] visit    HPI:  Pt is a 67 y.o. female seen today for medical management of chronic diseases.  She is a short-term rehabilitation resident of [facility].  She has a PMH of essential hypertension, migraines, GERD, and asthma. She was seen in her room today. BPs are stable - 116/72, 128/71. CBGs are well-controlled - 93,90, 107, 121.   Past Medical History:  Diagnosis Date  . Allergic rhinitis   . Arthritis    "ankles, feet, knees" (10/30/2017)  . Asthma   . Bursitis   . Carpal tunnel syndrome   . GERD (gastroesophageal reflux disease)   . HTN (hypertension)   . Migraine    "a couple/yr" (10/30/2017)  . Obesity   . Peripheral vascular disease ([place])   . Pneumonia 1960s X 1  . Sciatica   . Tendinitis   . Type II diabetes mellitus (HCC)    Type II  . UTI (urinary tract infection)    Past Surgical History:  Procedure Laterality Date  . CRANIOTOMY N/A 01/19/2018   Procedure: ENDOSCOPIC TRANSPHENOIDAL RESECTION OF PITUITARY TUMOR;  Surgeon: Consuella Lose, MD;  Location: [place];  Service: Neurosurgery;  Laterality:  N/A;  ENDOSCOPIC TRANSPHENOIDAL RESECTION OF PITUITARY TUMOR  . GANGLION CYST EXCISION Left   . KNEE ARTHROSCOPY Left 2003  . PLANTAR FASCIA RELEASE Right 1995   "heel"  . SINUS ENDO WITH FUSION N/A 01/19/2018   Procedure: SINUS ENDO WITH FUSION;  Surgeon: Jerrell Belfast, MD;  Location: [place];  Service: ENT;  Laterality: N/A;  . TONSILLECTOMY  1958  . TRANSPHENOIDAL APPROACH EXPOSURE N/A 01/19/2018   Procedure: TRANSPHENOIDAL  RESECTION OF PITUITARY TUMOR;  Surgeon: Jerrell Belfast, MD;  Location: [place];  Service: ENT;  Laterality: N/A;  . [place]; 1997   Dr [name]    Allergies  Allergen Reactions  . Penicillins Hives and Other (See Comments)    ++ got keflex in 2013 and 2019++ Has patient had a PCN reaction causing immediate rash, facial/tongue/throat swelling, SOB or lightheadedness with hypotension: No Has patient had a PCN reaction causing severe rash involving mucus membranes or skin necrosis: No PATIENT HAS HAD A PCN REACTION THAT REQUIRED HOSPITALIZATION:  #  #  YES  #  #  Has patient had a PCN reaction occurring within the last 10 years: No   . Codeine Nausea And Vomiting  . Indomethacin Diarrhea    Outpatient Encounter Medications as of 06/29/2018  Medication Sig  . acetaminophen (TYLENOL) 325 MG tablet Take 650 mg by mouth every 6 (six) hours as needed for mild pain or moderate pain.  [name] ADVAIR DISKUS 100-50 MCG/DOSE AEPB Inhale 1 puff into the lungs 2 (  two) times daily.  [name] albuterol (PROVENTIL HFA;VENTOLIN HFA) 108 (90 Base) MCG/ACT inhaler Inhale 2 puffs into the lungs every 6 (six) hours as needed for wheezing or shortness of breath.  [name] amLODipine (NORVASC) 10 MG tablet Take 1 tablet (10 mg total) by mouth daily. PATIENT MUST BE SEEN FOR ADDITIONAL REFILLS  . esomeprazole (NEXIUM) 20 MG capsule Take 1 capsule (20 mg total) by mouth every morning. For acid reflex  . furosemide (LASIX) 40 MG tablet TAKE ONE TABLET BY MOUTH EVERY DAY  . JANUMET 50-1000  MG tablet TAKE ONE TABLET BY MOUTH TWICE DAILY WITH MEALS  . levETIRAcetam (KEPPRA) 500 MG tablet Take 1 tablet (500 mg total) by mouth 2 (two) times daily.  [name] lisinopril (PRINIVIL,ZESTRIL) 40 MG tablet Take 40 mg by mouth daily.   . mometasone (NASONEX) 50 MCG/ACT nasal spray Place 1 spray into the nose as needed.  . naproxen (NAPROSYN) 500 MG tablet Take 1 tablet (500 mg total) by mouth 2 (two) times daily with a meal.  . [DISCONTINUED] glipiZIDE (GLUCOTROL XL) 10 MG 24 hr tablet TAKE TWO TABLETS BY MOUTH EVERY DAY   No facility-administered encounter medications on file as of 06/29/2018.     Review of Systems  GENERAL: No change in appetite, no fatigue, no weight changes, no fever, chills or weakness MOUTH and THROAT: Denies oral discomfort, gingival pain or bleeding RESPIRATORY: no cough, SOB, DOE, wheezing, hemoptysis CARDIAC: No chest pain, edema or palpitations GI: No abdominal pain, diarrhea, constipation, heart burn, nausea or vomiting GU: Denies dysuria, frequency, hematuria, or discharge PSYCHIATRIC: Denies feelings of depression or anxiety. No report of hallucinations, insomnia, paranoia, or agitation   Immunization History  Administered Date(s) Administered  . Influenza Split 03/08/2013  . Influenza Whole 09/03/2009, 06/03/2010  . Influenza,inj,Quad PF,6+ Mos 06/25/2014  . Influenza-Unspecified 06/08/2017  . Pneumococcal Polysaccharide-23 07/25/2002, 10/31/2017  . Td 07/25/2005   Pertinent  Health Maintenance Due  Topic Date Due  . OPHTHALMOLOGY EXAM  09/02/1962  . PAP SMEAR  09/02/1973  . COLONOSCOPY  09/02/2002  . MAMMOGRAM  01/14/2017  . DEXA SCAN  09/02/2017  . INFLUENZA VACCINE  02/22/2018  . FOOT EXAM  09/01/2018  . HEMOGLOBIN A1C  09/01/2018  . PNA vac Low Risk Adult (2 of 2 - PCV13) 11/01/2018   Fall Risk  03/06/2018 10/20/2017 09/01/2017 08/15/2017 07/28/2017  Falls in the past year? Yes Yes Yes Yes Yes  Number falls in past yr: 1 2 or more 2 or more 1 2 or  more  Comment - - 7 times  - -  Injury with Fall? Yes Yes No No Yes  Comment - Broken jaw - - -  Risk Factor Category  High Fall Risk High Fall Risk High Fall Risk High Fall Risk High Fall Risk  Risk for fall due to : Impaired balance/gait History of fall(s);Impaired mobility;Impaired balance/gait Impaired balance/gait;Impaired mobility Impaired balance/gait;Impaired mobility History of fall(s);Medication side effect;Impaired mobility  Follow up Falls prevention discussed - Falls prevention discussed Education provided Falls prevention discussed      Vitals:   06/29/18 0830  BP: 116/72  Pulse: 68  Resp: 18  Temp: 98.1 F (36.7 C)  TempSrc: Oral  SpO2: 96%  Weight: 211 lb 12.8 oz (96.1 kg)  Height: 5\' 1"  (1.549 m)   Body mass index is 40.02 kg/m.  Physical Exam  GENERAL APPEARANCE: Well nourished. In no acute distress. Morbidly obese SKIN:  BLE has lesions and dry MOUTH and THROAT: Lips are without  lesions. Oral mucosa is moist and without lesions.  RESPIRATORY: Breathing is even & unlabored, BS CTAB CARDIAC: RRR, no murmur,no extra heart sounds, no edema GI: Abdomen soft, normal BS, no masses, no tenderness NEUROLOGICAL: There is no tremor. Speech is clear. Alert to self, disoriented to time and place  PSYCHIATRIC:  Affect and behavior are appropriate  Labs reviewed: Recent Labs    06/02/18 0356 06/03/18 0548 06/04/18 0501 06/07/18  NA 137 136 136 140  K 3.4* 3.7 3.6 4.3  CL 106 104 104  --   CO2 23 25 25   --   GLUCOSE 104* 137* 126*  --   BUN 12 13 17  25*  CREATININE 1.05* 1.09* 0.91 0.8  CALCIUM 8.1* 8.5* 8.6*  --    Recent Labs    11/07/17 1212 04/25/18 0635 05/31/18 1401  AST 23 18 34  ALT 20 13 17   ALKPHOS 76 67 62  BILITOT 0.7 0.4 0.6  PROT 8.5* 9.0* 9.1*  ALBUMIN 3.3* 3.2* 3.4*   Recent Labs    04/25/18 0635  06/01/18 0035 06/03/18 0548 06/04/18 0501 06/07/18  WBC 13.7*   < > 12.9* 13.5* 13.7* 16.9  NEUTROABS 10.7*  --  9.6*  --   --  14    HGB 11.5*   < > 12.0 10.5* 11.3* 12.6  HCT 37.8   < > 37.9 34.1* 35.8* 38  MCV 92.6   < > 90.2 90.2 89.5  --   PLT 397   < > 395 424* 415* 433*   < > = values in this interval not displayed.   Lab Results  Component Value Date   TSH 2.749 10/31/2017   Lab Results  Component Value Date   HGBA1C 6.1 (H) 06/01/2018   Lab Results  Component Value Date   CHOL 178 08/30/2012   HDL 49 08/30/2012   LDLCALC 92 08/30/2012   TRIG 187 (H) 08/30/2012   CHOLHDL 3.6 08/30/2012    Significant Diagnostic Results in last 30 days:  Ct Head Wo Contrast  Result Date: 05/31/2018 CLINICAL DATA:  Altered level of consciousness.  Unwitnessed fall. EXAM: CT HEAD WITHOUT CONTRAST TECHNIQUE: Contiguous axial images were obtained from the base of the skull through the vertex without intravenous contrast. COMPARISON:  CT scan of October 30, 2017.  MRI of November 01, 2017. FINDINGS: Brain: Mild chronic ischemic white matter disease is noted. No mass effect or midline shift is noted. Ventricular size is within normal limits. There is no evidence of hemorrhage or infarction. 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 Vascular: No hyperdense vessel or unexpected calcification. Skull: Normal. Negative for fracture or focal lesion. Sinuses/Orbits: Left frontal and ethmoid sinusitis is noted. Other: None. IMPRESSION: Mild chronic ischemic white matter disease. 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 Left frontal and ethmoid sinusitis. No other significant intracranial abnormality seen. Electronically Signed   By: Marijo Conception, M.D.   On: 05/31/2018 16:12   Dg Chest Port 1 View  Result Date: 05/31/2018 CLINICAL DATA:  Unwitnessed fall. Husband found wife learning over a chair on her knees. Alert and oriented to event/ Pt states she tripped and fell ( mechanical fall) . Pt states about an hour. No trauma noted.  During transport pt presents with increased confusion and lethargy. EXAM: PORTABLE CHEST 1 VIEW COMPARISON:  10/30/2017 FINDINGS: Cardiac silhouette is normal in size. No mediastinal or  hilar masses. No evidence of adenopathy. Clear lungs.  No pleural effusion or pneumothorax. Skeletal structures are grossly intact. IMPRESSION: No active disease. Electronically Signed   By: Lajean Manes M.D.   On: 05/31/2018 15:17    Assessment/Plan  1. Type 2 diabetes mellitus treated without insulin (HCC) -glipizide was recently discontinued continue Janumet 50-1000 mg 1 tab twice a day Lab Results  Component Value Date   HGBA1C 6.1 (H) 06/01/2018    2. Uncomplicated asthma, unspecified asthma severity, unspecified whether persistent -no SOB nor wheezing, continue Advair 100-50 mcg DISK 1 puff into lungs twice a day   3. Essential hypertension -well-controlled, continue lisinopril 40 mg 1 tab daily and Norvasc 10 mg 1 tab daily   4. Allergic rhinitis, unspecified seasonality, unspecified trigger -no complaints, continue Nasonex 50 mcg instill 1 spray into each nostril daily as needed   5. Seizure -no recent seizures, continue Keppra 500 mg 1 tab twice a day      Family/ staff Communication: Discussed plan of care with resident.  Labs/tests ordered:  None  Goals of care:   Short-term rehabilitation.   Durenda Age, NP [facility] [phone] (Monday-Friday 8:00 a.m. - 5:00 p.m.) [phone] (after hours)

## 2018-07-06 LAB — BASIC METABOLIC PANEL
BUN: 21 (ref 4–21)
Creatinine: 1.1 (ref 0.5–1.1)
Glucose: 92
Potassium: 4.4 (ref 3.4–5.3)
Sodium: 142 (ref 137–147)

## 2018-07-09 ENCOUNTER — Encounter: Payer: Medicare Other | Admitting: Internal Medicine

## 2018-07-09 ENCOUNTER — Ambulatory Visit: Payer: Medicare Other | Admitting: Dietician

## 2018-08-07 ENCOUNTER — Encounter: Payer: Self-pay | Admitting: Adult Health

## 2018-08-07 ENCOUNTER — Non-Acute Institutional Stay (SKILLED_NURSING_FACILITY): Payer: Medicare Other | Admitting: Adult Health

## 2018-08-07 DIAGNOSIS — R600 Localized edema: Secondary | ICD-10-CM | POA: Diagnosis not present

## 2018-08-07 DIAGNOSIS — K219 Gastro-esophageal reflux disease without esophagitis: Secondary | ICD-10-CM | POA: Diagnosis not present

## 2018-08-07 DIAGNOSIS — J45909 Unspecified asthma, uncomplicated: Secondary | ICD-10-CM | POA: Diagnosis not present

## 2018-08-07 DIAGNOSIS — G40909 Epilepsy, unspecified, not intractable, without status epilepticus: Secondary | ICD-10-CM | POA: Diagnosis not present

## 2018-08-07 DIAGNOSIS — E119 Type 2 diabetes mellitus without complications: Secondary | ICD-10-CM | POA: Diagnosis not present

## 2018-08-07 DIAGNOSIS — I10 Essential (primary) hypertension: Secondary | ICD-10-CM | POA: Diagnosis not present

## 2018-08-07 NOTE — Progress Notes (Signed)
Location:  [place] Room Number: 108-A Place of Service:  SNF (31) Provider:  Durenda Age, NP  Patient Care Team: [name], MD as PCP - General (Internal Medicine)  Extended Emergency Contact Information Primary Emergency Contact: Jayde, Mcallister Home Phone: [phone] Relation: Sister Secondary Emergency Contact: Sandria Manly Mobile Phone: [phone] Relation: Niece  Code Status:  Full Code  Goals of care: Advanced Directive information Advanced Directives 05/31/2018  Does Patient Have a Medical Advance Directive? No  Would patient like information on creating a medical advance directive? No - Patient declined  Pre-existing out of facility DNR order (yellow form or pink MOST form) -     Chief Complaint  Patient presents with  . Medical Management of Chronic Issues    Routine [facility] visit    HPI:  Pt is a 68 y.o. female seen today for medical management of chronic diseases.  She is a short-term rehabilitation resident at [facility].  She has a PMH of essential hypertension, migraines, GERD, and asthma. She was seen in the room today. She is awake and sitting on the wheelchair. Noted to have low CBGs - 85, 86, 92, 106, 118 89. She denies dizziness. She had 30.4 lbs weight loss in 1 month.  BLE edema had significant improvement.    Past Medical History:  Diagnosis Date  . Allergic rhinitis   . Arthritis    "ankles, feet, knees" (10/30/2017)  . Asthma   . Bursitis   . Carpal tunnel syndrome   . GERD (gastroesophageal reflux disease)   . HTN (hypertension)   . Migraine    "a couple/yr" (10/30/2017)  . Obesity   . Peripheral vascular disease ([place])   . Pneumonia 1960s X 1  . Sciatica   . Tendinitis   . Type II diabetes mellitus (HCC)    Type II  . UTI (urinary tract infection)    Past Surgical History:  Procedure Laterality Date  . CRANIOTOMY N/A 01/19/2018   Procedure: ENDOSCOPIC  TRANSPHENOIDAL RESECTION OF PITUITARY TUMOR;  Surgeon: Consuella Lose, MD;  Location: [place];  Service: Neurosurgery;  Laterality: N/A;  ENDOSCOPIC TRANSPHENOIDAL RESECTION OF PITUITARY TUMOR  . GANGLION CYST EXCISION Left   . KNEE ARTHROSCOPY Left 2003  . PLANTAR FASCIA RELEASE Right 1995   "heel"  . SINUS ENDO WITH FUSION N/A 01/19/2018   Procedure: SINUS ENDO WITH FUSION;  Surgeon: Jerrell Belfast, MD;  Location: [place];  Service: ENT;  Laterality: N/A;  . TONSILLECTOMY  1958  . TRANSPHENOIDAL APPROACH EXPOSURE N/A 01/19/2018   Procedure: TRANSPHENOIDAL  RESECTION OF PITUITARY TUMOR;  Surgeon: Jerrell Belfast, MD;  Location: [place];  Service: ENT;  Laterality: N/A;  . [place]; 1997   Dr [name]    Allergies  Allergen Reactions  . Penicillins Hives and Other (See Comments)    ++ got keflex in 2013 and 2019++ Has patient had a PCN reaction causing immediate rash, facial/tongue/throat swelling, SOB or lightheadedness with hypotension: No Has patient had a PCN reaction causing severe rash involving mucus membranes or skin necrosis: No PATIENT HAS HAD A PCN REACTION THAT REQUIRED HOSPITALIZATION:  #  #  YES  #  #  Has patient had a PCN reaction occurring within the last 10 years: No   . Codeine Nausea And Vomiting  . Indomethacin Diarrhea    Outpatient Encounter Medications as of 08/07/2018  Medication Sig  . acetaminophen (TYLENOL) 325 MG tablet Take 650 mg by mouth  every 6 (six) hours as needed for mild pain or moderate pain.  [name] ADVAIR DISKUS 100-50 MCG/DOSE AEPB Inhale 1 puff into the lungs 2 (two) times daily.  [name] albuterol (PROVENTIL HFA;VENTOLIN HFA) 108 (90 Base) MCG/ACT inhaler Inhale 2 puffs into the lungs every 6 (six) hours as needed for wheezing or shortness of breath.  [name] amLODipine (NORVASC) 10 MG tablet Take 1 tablet (10 mg total) by mouth daily. PATIENT MUST BE SEEN FOR ADDITIONAL REFILLS  . esomeprazole (NEXIUM) 20 MG capsule Take 1 capsule (20 mg  total) by mouth every morning. For acid reflex  . furosemide (LASIX) 40 MG tablet TAKE ONE TABLET BY MOUTH EVERY DAY  . JANUMET 50-1000 MG tablet TAKE ONE TABLET BY MOUTH TWICE DAILY WITH MEALS  . levETIRAcetam (KEPPRA) 500 MG tablet Take 1 tablet (500 mg total) by mouth 2 (two) times daily.  [name] lisinopril (PRINIVIL,ZESTRIL) 40 MG tablet Take 40 mg by mouth daily.   . mometasone (NASONEX) 50 MCG/ACT nasal spray Place 1 spray into the nose as needed.  . naproxen (NAPROSYN) 500 MG tablet Take 1 tablet (500 mg total) by mouth 2 (two) times daily with a meal.  . Nutritional Supplements (NUTRITIONAL SUPPLEMENT PO) Take 1 each by mouth daily. Magic Cup   No facility-administered encounter medications on file as of 08/07/2018.     Review of Systems  GENERAL: No change in appetite, no fatigue, no weight changes, no fever, chills or weakness MOUTH and THROAT: Denies oral discomfort, gingival pain or bleeding RESPIRATORY: no cough, SOB, DOE, wheezing, hemoptysis CARDIAC: No chest pain, or palpitations GI: No abdominal pain, diarrhea, constipation, heart burn, nausea or vomiting GU: Denies dysuria, frequency, hematuria, or discharge PSYCHIATRIC: Denies feelings of depression or anxiety. No report of hallucinations, insomnia, paranoia, or agitation   Immunization History  Administered Date(s) Administered  . Influenza Split 03/08/2013  . Influenza Whole 09/03/2009, 06/03/2010  . Influenza,inj,Quad PF,6+ Mos 06/25/2014  . Influenza-Unspecified 06/08/2017  . Pneumococcal Polysaccharide-23 07/25/2002, 10/31/2017  . Td 07/25/2005   Pertinent  Health Maintenance Due  Topic Date Due  . OPHTHALMOLOGY EXAM  09/02/1962  . PAP SMEAR-Modifier  09/02/1973  . COLONOSCOPY  09/02/2002  . MAMMOGRAM  01/14/2017  . DEXA SCAN  09/02/2017  . INFLUENZA VACCINE  02/22/2018  . FOOT EXAM  09/01/2018  . HEMOGLOBIN A1C  09/01/2018  . PNA vac Low Risk Adult (2 of 2 - PCV13) 11/01/2018   Fall Risk  03/06/2018  10/20/2017 09/01/2017 08/15/2017 07/28/2017  Falls in the past year? Yes Yes Yes Yes Yes  Number falls in past yr: 1 2 or more 2 or more 1 2 or more  Comment - - 7 times  - -  Injury with Fall? Yes Yes No No Yes  Comment - Broken jaw - - -  Risk Factor Category  High Fall Risk High Fall Risk High Fall Risk High Fall Risk High Fall Risk  Risk for fall due to : Impaired balance/gait History of fall(s);Impaired mobility;Impaired balance/gait Impaired balance/gait;Impaired mobility Impaired balance/gait;Impaired mobility History of fall(s);Medication side effect;Impaired mobility  Follow up Falls prevention discussed - Falls prevention discussed Education provided Falls prevention discussed    Vitals:   08/07/18 0924  Weight: 181 lb 6.4 oz (82.3 kg)  Height: 5\' 1"  (1.549 m)   Body mass index is 34.28 kg/m.  Physical Exam  GENERAL APPEARANCE: Well nourished. In no acute distress. Obese SKIN:  Bilateral lower legs are dryne MOUTH and THROAT: Lips are without lesions. Oral  mucosa is moist and without lesions. Tongue is normal in shape, size, and color and without lesions RESPIRATORY: Breathing is even & unlabored, BS CTAB CARDIAC: RRR, no murmur,no extra heart sounds, no edema GI: Abdomen soft, normal BS, no masses, no tenderness EXTREMITIES:  Able to move X 4 extremities NEUROLOGICAL: There is no tremor. Speech is clear. Alert to self, disoriented to time and place. PSYCHIATRIC:  Affect and behavior are appropriate   Labs reviewed: Recent Labs    06/02/18 0356 06/03/18 0548 06/04/18 0501 06/07/18 07/06/18  NA 137 136 136 140 142  K 3.4* 3.7 3.6 4.3 4.4  CL 106 104 104  --   --   CO2 23 25 25   --   --   GLUCOSE 104* 137* 126*  --   --   BUN 12 13 17  25* 21  CREATININE 1.05* 1.09* 0.91 0.8 1.1  CALCIUM 8.1* 8.5* 8.6*  --   --    Recent Labs    11/07/17 1212 04/25/18 0635 05/31/18 1401  AST 23 18 34  ALT 20 13 17   ALKPHOS 76 67 62  BILITOT 0.7 0.4 0.6  PROT 8.5* 9.0* 9.1*    ALBUMIN 3.3* 3.2* 3.4*   Recent Labs    04/25/18 0635  06/01/18 0035 06/03/18 0548 06/04/18 0501 06/07/18  WBC 13.7*   < > 12.9* 13.5* 13.7* 16.9  NEUTROABS 10.7*  --  9.6*  --   --  14  HGB 11.5*   < > 12.0 10.5* 11.3* 12.6  HCT 37.8   < > 37.9 34.1* 35.8* 38  MCV 92.6   < > 90.2 90.2 89.5  --   PLT 397   < > 395 424* 415* 433*   < > = values in this interval not displayed.   Lab Results  Component Value Date   TSH 2.749 10/31/2017   Lab Results  Component Value Date   HGBA1C 6.1 (H) 06/01/2018   Lab Results  Component Value Date   CHOL 178 08/30/2012   HDL 49 08/30/2012   LDLCALC 92 08/30/2012   TRIG 187 (H) 08/30/2012   CHOLHDL 3.6 08/30/2012    Assessment/Plan  1. Type 2 diabetes mellitus treated without insulin (HCC) -CBGs are low, will decrease Janumet from 50-1000 mg twice daily to 50-500 mg 1 tab p.o. twice daily, continue CBG monitoring twice a day Lab Results  Component Value Date   HGBA1C 6.1 (H) 06/01/2018    2. Essential hypertension - latest BP 119/70, stable, continue lisinopril 40 mg 1 tab daily and Norvasc 10 mg 1 tab daily   3. Seizure disorder ([place]) -no recent seizures, continue Keppra 500 mg 1 tab twice a day    4. Moderate asthma without complication, unspecified whether persistent -no S OB nor wheezing, continue Advair 100-50 Diskus 1 puff into the lungs twice a day and Proventil as needed   5. Gastroesophageal reflux disease without esophagitis -stable, continue Nexium capsule daily   6.  Bilateral lower extremity edema -had weight loss of 30.4 lbs, edema has significantly improved, continue Lasix 40 mg 1 tab daily      Family/ staff Communication: Discussed plan of care with patient.  Labs/tests ordered: BMP  Goals of care:   Short-term rehabilitation.   Durenda Age, NP [facility] [phone] (Monday-Friday 8:00 a.m. - 5:00 p.m.) [phone] (after hours)

## 2018-08-21 IMAGING — CT CT CERVICAL SPINE W/O CM
5 of 8 series · 11 of 33 positions shown, 12 images · non-contrast
Comparison: 10/03/2017

CLINICAL DATA: Fall.  Weakness.

EXAM:
CT HEAD WITHOUT CONTRAST
CT CERVICAL SPINE WITHOUT CONTRAST
TECHNIQUE: Multidetector CT imaging of the head and cervical spine was
performed following the standard protocol without intravenous
contrast. Multiplanar CT image reconstructions of the cervical spine
were also generated.

[Series 4: head bone · axial · 0.40mm/px · z∈[-214,-160]mm · 2 of 81 slices shown]
[im 27/81  bone]
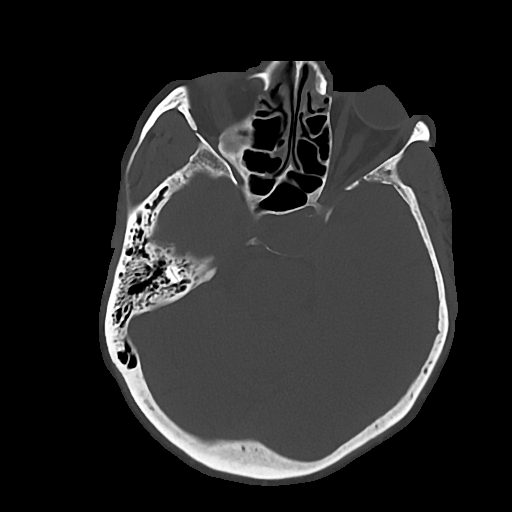
[im 54/81  bone]
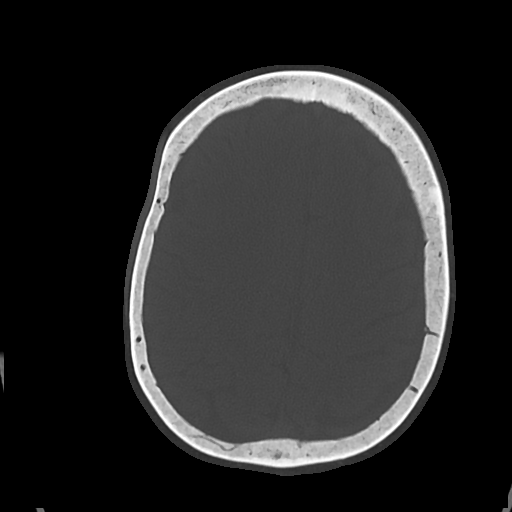

[Series 8: c spine soft · axial · 0.26mm/px · z∈[-344,-282]mm · 2 of 93 slices shown]
[im 31/93  soft-tissue]
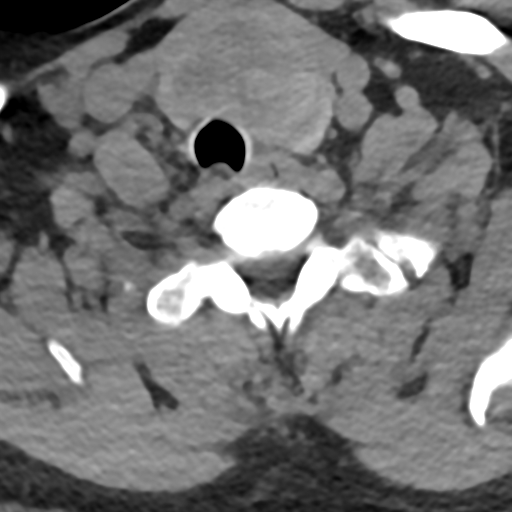
[im 62/93  soft-tissue]
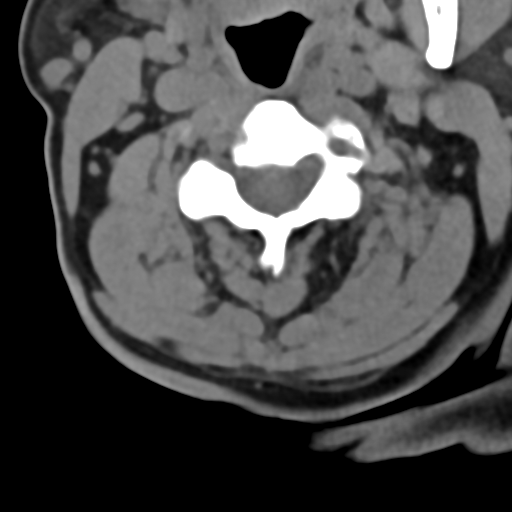

[Series 9: sag bone · sagittal · 0.23mm/px · 4 of 61 slices shown]
[im 13/61  bone]
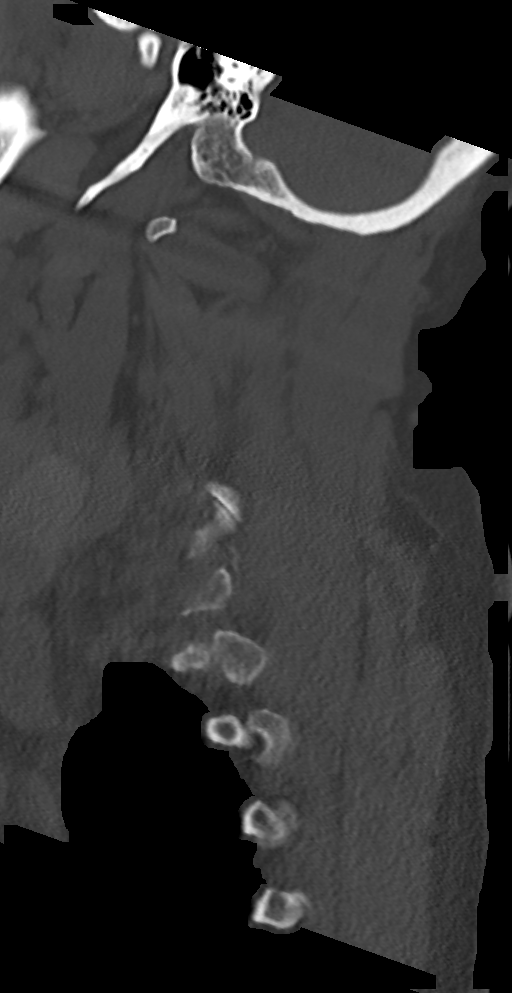
[im 25/61  bone]
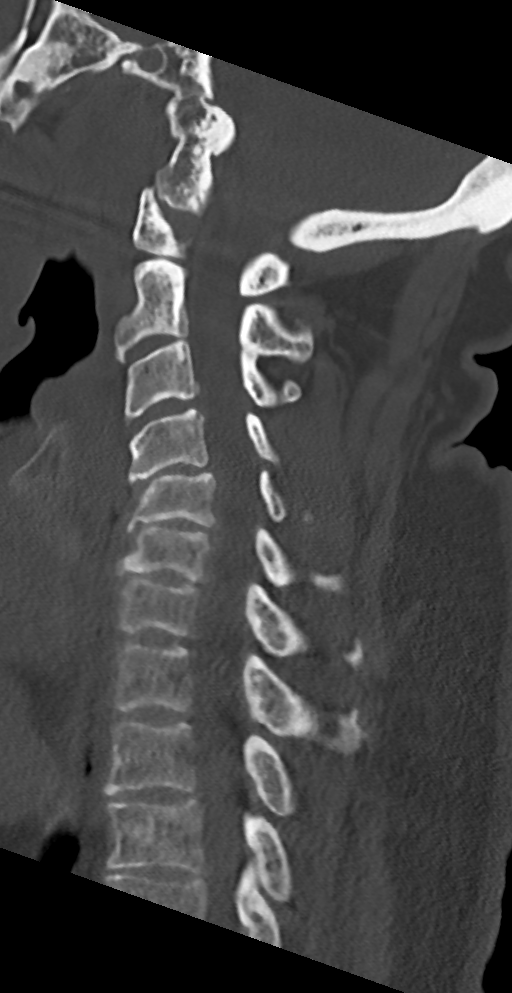
[im 37/61  bone]
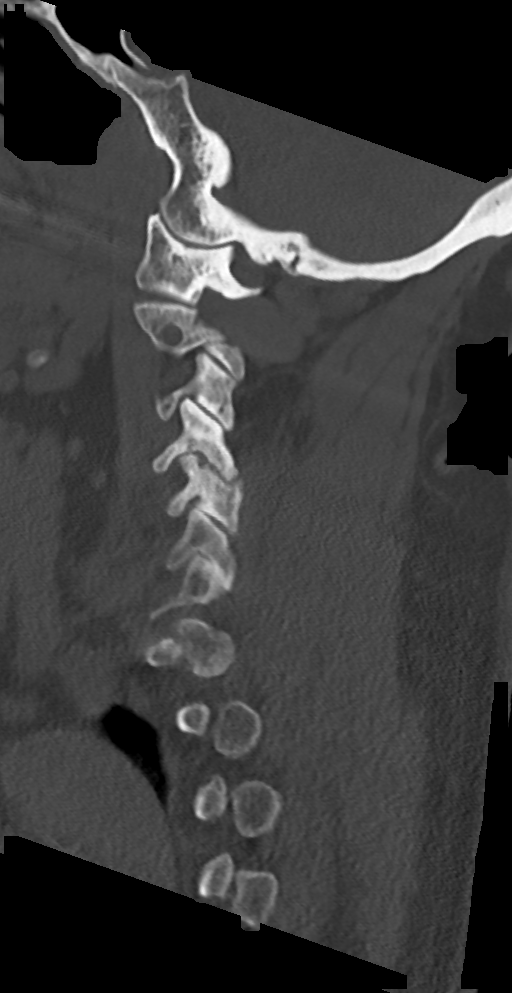
[im 49/61  bone]
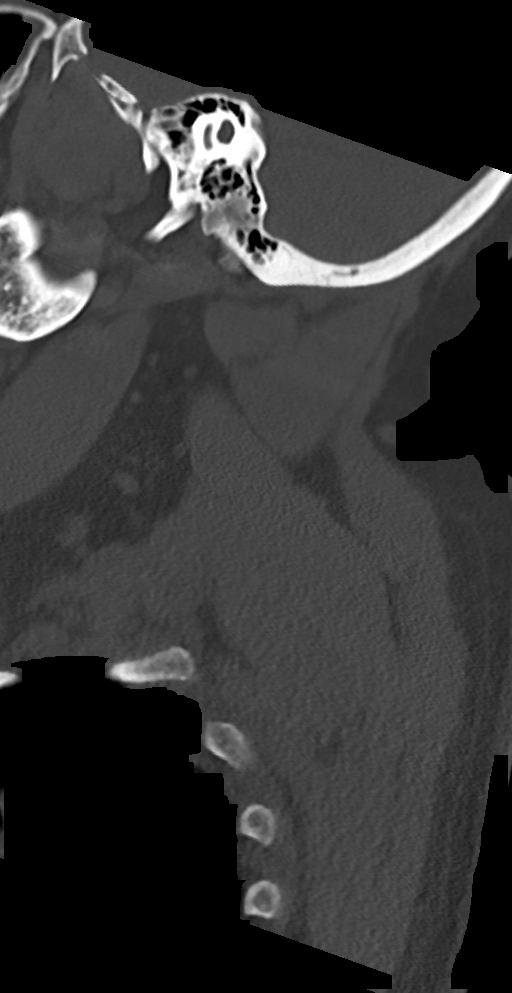

[Series 10: cor bone · coronal · 0.27mm/px · 1 of 61 slices shown]
[im 31/61  bone]
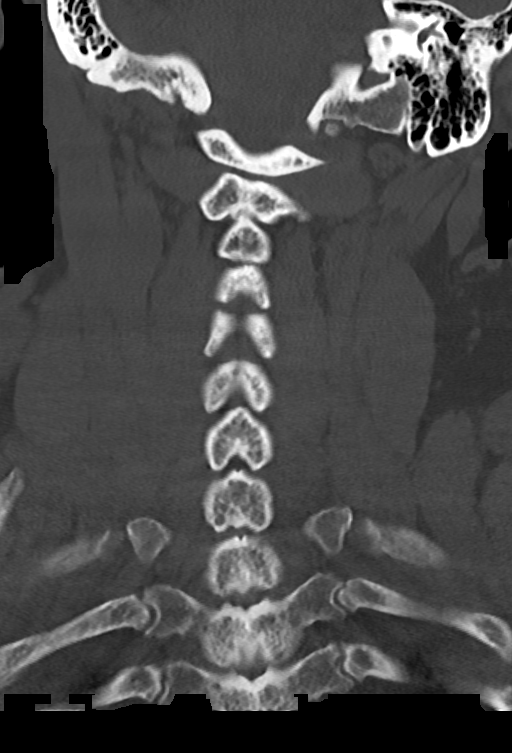

[Series 12: orthogonal axials · axial · 0.21mm/px · z∈[-359,-301]mm · 2 of 91 slices shown, 3 images]
[im 31/91  soft-tissue]
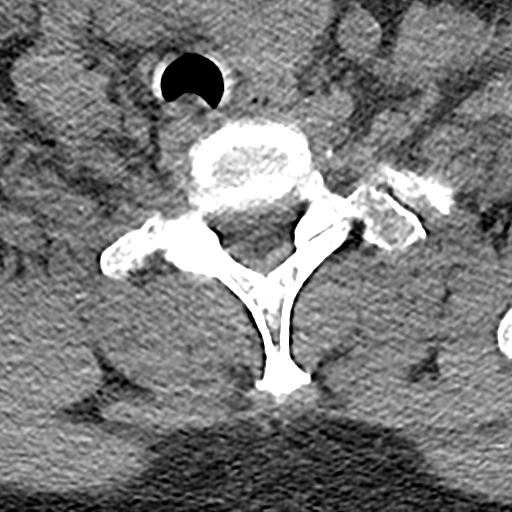
[im 31/91  bone]
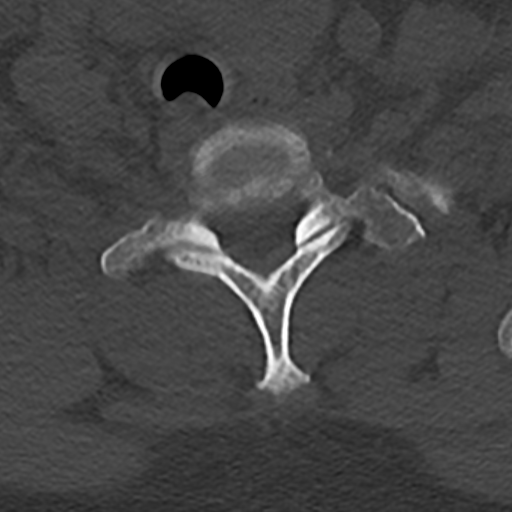
[im 61/91  bone]
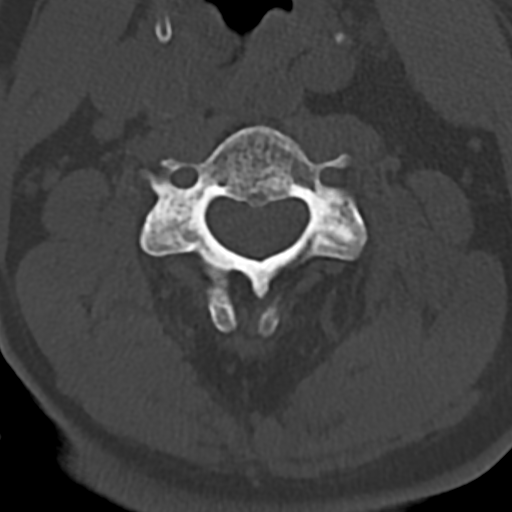

[11 of 33 positions shown; findings below may reference images not displayed]

FINDINGS: CT HEAD FINDINGS

Brain: No evidence of acute infarction, hemorrhage, hydrocephalus,
or extra-axial collection. There is mild diffuse low-attenuation
within the subcortical and periventricular white matter compatible
with chronic microvascular disease. Again noted is a mass within the
sella turcica and suprasellar cistern, image 33/6.

Vascular: No hyperdense vessel or unexpected calcification.

Skull: Normal. Negative for fracture or focal lesion.

Sinuses/Orbits: Fluid levels identified within bilateral maxillary
sinuses.

Other: None

CT CERVICAL SPINE FINDINGS

Alignment: Straightening of normal cervical lordosis.
Anterolisthesis of C4 on C5 identified.

Skull base and vertebrae: No fractures identified.

Soft tissues and spinal canal: No prevertebral fluid or swelling. No
visible canal hematoma.

Disc levels: Multi level disc space narrowing and ventral endplate
spurring noted extending from C3-4 through C6-7.

Upper chest: There is a large solid-appearing mass arising from the
left lobe of thyroid gland and isthmus measuring 4.1 cm, image 64/8.

Other: None
IMPRESSION: 1. No acute intracranial abnormalities. Chronic small vessel
ischemic disease noted.
2. No evidence for cervical spine fracture or dislocation. Advanced
cervical spondylosis noted.
3. Mass within the sella turcica and suprasellar cistern is again
noted.
4. **An incidental finding of potential clinical significance has
been found. 4.1 cm solid-appearing nodule within the thyroid gland
is noted.** Consider further evaluation with thyroid ultrasound. If
patient is clinically hyperthyroid, consider nuclear medicine
thyroid uptake and scan.
5. Bilateral maxillary sinus fluid levels.

## 2018-08-27 ENCOUNTER — Ambulatory Visit: Payer: Medicare Other | Admitting: Dietician

## 2018-08-27 ENCOUNTER — Encounter: Payer: Medicare Other | Admitting: Internal Medicine

## 2018-08-27 ENCOUNTER — Encounter: Payer: Self-pay | Admitting: Internal Medicine

## 2018-09-05 ENCOUNTER — Non-Acute Institutional Stay (SKILLED_NURSING_FACILITY): Payer: Medicare Other | Admitting: Adult Health

## 2018-09-05 ENCOUNTER — Encounter: Payer: Self-pay | Admitting: Adult Health

## 2018-09-05 DIAGNOSIS — Z7189 Other specified counseling: Secondary | ICD-10-CM

## 2018-09-05 DIAGNOSIS — J45909 Unspecified asthma, uncomplicated: Secondary | ICD-10-CM

## 2018-09-05 DIAGNOSIS — I10 Essential (primary) hypertension: Secondary | ICD-10-CM | POA: Diagnosis not present

## 2018-09-05 DIAGNOSIS — E119 Type 2 diabetes mellitus without complications: Secondary | ICD-10-CM

## 2018-09-05 DIAGNOSIS — G40909 Epilepsy, unspecified, not intractable, without status epilepticus: Secondary | ICD-10-CM

## 2018-09-05 NOTE — Progress Notes (Signed)
Location:  [place] Room Number: 218-B Place of Service:  SNF (31) Provider:  Durenda Age, NP  Patient Care Team: [name], MD as PCP - General (Internal Medicine)  Extended Emergency Contact Information Primary Emergency Contact: Velicia, Dejager Home Phone: [phone] Relation: Sister Secondary Emergency Contact: Sandria Manly Mobile Phone: [phone] Relation: Niece  Code Status:  Full Code  Goals of care: Advanced Directive information Advanced Directives 05/31/2018  Does Patient Have a Medical Advance Directive? No  Would patient like information on creating a medical advance directive? No - Patient declined  Pre-existing out of facility DNR order (yellow form or pink MOST form) -     Chief Complaint  Patient presents with  . Medical Management of Chronic Issues    Routine [facility] visit  . Advanced Directive    Care Plan Meeting    HPI:  Pt is a 68 y.o. female seen today for medical management of chronic diseases, as well as a care plan meeting.  She is a long-term care resident of [facility].  She has a PMH of essential hypertension, migraines, GERD, and asthma. She was seen in the room today. She was sitting on the edge of the bed. She is alert to self and time but disoriented to place. Latest BIMS score 10/15. - Moderate cognitive deficit. She was reported to forget directions. Review of Activity personnel notes stated that she attended less than one group activity per week during the last quarter. She does not desire regular one on one activity visits at this time. No recent reported seizure activity since admission. Her CBGs has been ranging from 77 to 160 - well-controlled. Her BPs has been well-controlled, latest BP 110/73. Care plan meeting was attended by MDS coordinator, Social Worker and NP. Resident was needing to be in the bathroom and was there all throughout the entire meeting. She was  invited to the meeting earlier but said that she needs to be in the bathroom just before the meeting was started. The Social Worker will talk to resident and update her regarding the care plan meeting. Her code status is full code. The meeting lasted for 20 minutes.   Past Medical History:  Diagnosis Date  . Allergic rhinitis   . Arthritis    "ankles, feet, knees" (10/30/2017)  . Asthma   . Bursitis   . Carpal tunnel syndrome   . GERD (gastroesophageal reflux disease)   . HTN (hypertension)   . Migraine    "a couple/yr" (10/30/2017)  . Obesity   . Peripheral vascular disease ([place])   . Pneumonia 1960s X 1  . Sciatica   . Tendinitis   . Type II diabetes mellitus (HCC)    Type II  . UTI (urinary tract infection)    Past Surgical History:  Procedure Laterality Date  . CRANIOTOMY N/A 01/19/2018   Procedure: ENDOSCOPIC TRANSPHENOIDAL RESECTION OF PITUITARY TUMOR;  Surgeon: Consuella Lose, MD;  Location: [place];  Service: Neurosurgery;  Laterality: N/A;  ENDOSCOPIC TRANSPHENOIDAL RESECTION OF PITUITARY TUMOR  . GANGLION CYST EXCISION Left   . KNEE ARTHROSCOPY Left 2003  . PLANTAR FASCIA RELEASE Right 1995   "heel"  . SINUS ENDO WITH FUSION N/A 01/19/2018   Procedure: SINUS ENDO WITH FUSION;  Surgeon: Jerrell Belfast, MD;  Location: [place];  Service: ENT;  Laterality: N/A;  . TONSILLECTOMY  1958  . TRANSPHENOIDAL APPROACH EXPOSURE N/A 01/19/2018   Procedure: TRANSPHENOIDAL  RESECTION OF PITUITARY TUMOR;  Surgeon: Wilburn Cornelia,  Shanon Brow, MD;  Location: [place];  Service: ENT;  Laterality: N/A;  . [place]; 1997   Dr [name]    Allergies  Allergen Reactions  . Penicillins Hives and Other (See Comments)    ++ got keflex in 2013 and 2019++ Has patient had a PCN reaction causing immediate rash, facial/tongue/throat swelling, SOB or lightheadedness with hypotension: No Has patient had a PCN reaction causing severe rash involving mucus membranes or skin necrosis: No PATIENT HAS  HAD A PCN REACTION THAT REQUIRED HOSPITALIZATION:  #  #  YES  #  #  Has patient had a PCN reaction occurring within the last 10 years: No   . Codeine Nausea And Vomiting  . Indomethacin Diarrhea    Outpatient Encounter Medications as of 09/05/2018  Medication Sig  . acetaminophen (TYLENOL) 325 MG tablet Take 650 mg by mouth every 6 (six) hours as needed for mild pain or moderate pain.  [name] ADVAIR DISKUS 100-50 MCG/DOSE AEPB Inhale 1 puff into the lungs 2 (two) times daily.  [name] albuterol (PROVENTIL HFA;VENTOLIN HFA) 108 (90 Base) MCG/ACT inhaler Inhale 2 puffs into the lungs every 6 (six) hours as needed for wheezing or shortness of breath.  [name] amLODipine (NORVASC) 10 MG tablet Take 1 tablet (10 mg total) by mouth daily. PATIENT MUST BE SEEN FOR ADDITIONAL REFILLS  . esomeprazole (NEXIUM) 20 MG capsule Take 1 capsule (20 mg total) by mouth every morning. For acid reflex  . furosemide (LASIX) 40 MG tablet TAKE ONE TABLET BY MOUTH EVERY DAY  . JANUMET 50-1000 MG tablet TAKE ONE TABLET BY MOUTH TWICE DAILY WITH MEALS  . levETIRAcetam (KEPPRA) 500 MG tablet Take 1 tablet (500 mg total) by mouth 2 (two) times daily.  [name] lisinopril (PRINIVIL,ZESTRIL) 40 MG tablet Take 40 mg by mouth daily.   . mometasone (NASONEX) 50 MCG/ACT nasal spray Place 1 spray into the nose as needed.  . naproxen (NAPROSYN) 500 MG tablet Take 500 mg by mouth 2 (two) times daily as needed for moderate pain (Joint pain). Take after meals  . Nutritional Supplements (NUTRITIONAL SUPPLEMENT PO) Take 1 each by mouth daily. Magic Cup   No facility-administered encounter medications on file as of 09/05/2018.     Review of Systems  GENERAL: No change in appetite, no fatigue, no weight changes, no fever, chills or weakness MOUTH and THROAT: Denies oral discomfort, gingival pain or bleeding RESPIRATORY: no cough, SOB, DOE, wheezing, hemoptysis CARDIAC: No chest pain, edema or palpitations GI: No abdominal pain, diarrhea, constipation,  heart burn, nausea or vomiting GU: Denies dysuria, frequency, hematuria, incontinence, or discharge NEUROLOGICAL: Denies dizziness, syncope, numbness, or headache PSYCHIATRIC: Denies feelings of depression or anxiety. No report of hallucinations, insomnia, paranoia, or agitation    Immunization History  Administered Date(s) Administered  . Influenza Split 03/08/2013  . Influenza Whole 09/03/2009, 06/03/2010  . Influenza,inj,Quad PF,6+ Mos 06/25/2014  . Influenza-Unspecified 06/08/2017  . Pneumococcal Polysaccharide-23 07/25/2002, 10/31/2017  . Td 07/25/2005   Pertinent  Health Maintenance Due  Topic Date Due  . OPHTHALMOLOGY EXAM  09/02/1962  . COLONOSCOPY  09/02/2002  . MAMMOGRAM  01/14/2017  . DEXA SCAN  09/02/2017  . INFLUENZA VACCINE  02/22/2018  . FOOT EXAM  09/01/2018  . HEMOGLOBIN A1C  09/01/2018  . PNA vac Low Risk Adult (2 of 2 - PCV13) 11/01/2018   Fall Risk  03/06/2018 10/20/2017 09/01/2017 08/15/2017 07/28/2017  Falls in the past year? Yes Yes Yes Yes Yes  Number falls in  past yr: 1 2 or more 2 or more 1 2 or more  Comment - - 7 times  - -  Injury with Fall? Yes Yes No No Yes  Comment - Broken jaw - - -  Risk Factor Category  High Fall Risk High Fall Risk High Fall Risk High Fall Risk High Fall Risk  Risk for fall due to : Impaired balance/gait History of fall(s);Impaired mobility;Impaired balance/gait Impaired balance/gait;Impaired mobility Impaired balance/gait;Impaired mobility History of fall(s);Medication side effect;Impaired mobility  Follow up Falls prevention discussed - Falls prevention discussed Education provided Falls prevention discussed     Vitals:   09/05/18 0955  BP: 110/73  Pulse: (!) 103  Resp: 18  Temp: 99.5 F (37.5 C)  TempSrc: Oral  SpO2: 94%  Weight: 181 lb 6.4 oz (82.3 kg)  Height: 5\' 1"  (1.549 m)   Body mass index is 34.28 kg/m.  Physical Exam  GENERAL APPEARANCE: Well nourished. In no acute distress.Obese SKIN:  Skin is warm and  dry.  MOUTH and THROAT: Lips are without lesions. Oral mucosa is moist and without lesions. Tongue is normal in shape, size, and color and without lesions RESPIRATORY: Breathing is even & unlabored, BS CTAB CARDIAC: RRR, no murmur,no extra heart sounds, no edema GI: Abdomen soft, normal BS, no masses, no tenderness EXTREMITIES:  Able to move X 4 extremities NEUROLOGICAL: There is no tremor. Speech is clear. Alert to self and time, disoriented to place. PSYCHIATRIC:  Affect and behavior are appropriate   Labs reviewed: Recent Labs    06/02/18 0356 06/03/18 0548 06/04/18 0501 06/07/18 07/06/18  NA 137 136 136 140 142  K 3.4* 3.7 3.6 4.3 4.4  CL 106 104 104  --   --   CO2 23 25 25   --   --   GLUCOSE 104* 137* 126*  --   --   BUN 12 13 17  25* 21  CREATININE 1.05* 1.09* 0.91 0.8 1.1  CALCIUM 8.1* 8.5* 8.6*  --   --    Recent Labs    11/07/17 1212 04/25/18 0635 05/31/18 1401  AST 23 18 34  ALT 20 13 17   ALKPHOS 76 67 62  BILITOT 0.7 0.4 0.6  PROT 8.5* 9.0* 9.1*  ALBUMIN 3.3* 3.2* 3.4*   Recent Labs    04/25/18 0635  06/01/18 0035 06/03/18 0548 06/04/18 0501 06/07/18  WBC 13.7*   < > 12.9* 13.5* 13.7* 16.9  NEUTROABS 10.7*  --  9.6*  --   --  14  HGB 11.5*   < > 12.0 10.5* 11.3* 12.6  HCT 37.8   < > 37.9 34.1* 35.8* 38  MCV 92.6   < > 90.2 90.2 89.5  --   PLT 397   < > 395 424* 415* 433*   < > = values in this interval not displayed.   Lab Results  Component Value Date   TSH 2.749 10/31/2017   Lab Results  Component Value Date   HGBA1C 6.1 (H) 06/01/2018   Lab Results  Component Value Date   CHOL 178 08/30/2012   HDL 49 08/30/2012   LDLCALC 92 08/30/2012   TRIG 187 (H) 08/30/2012   CHOLHDL 3.6 08/30/2012    Assessment/Plan  1. Type 2 diabetes mellitus treated without insulin (HCC) Lab Results  Component Value Date   HGBA1C 6.1 (H) 06/01/2018  - well-controlled, continue Janumet 50-500 mg 1 tab twice a day  2. Essential  hypertension -Well-controlled, continue Norvasc 10 mg 1 tab daily, lisinopril  40 mg 1 tab daily and Lasix 40 mg 1 tab daily  3. Seizure disorder ([place]) -No recent seizures, continue Keppra 500 mg 1 tab twice a day  4. Moderate asthma without complication, unspecified whether persistent -Stable, no wheezing and no SOB, continue Proventil HFA 90 mcg inhaler inhale 2 puffs into the lungs every 6 hours PRN, Advair 100-50 disk administer 1 puff into the lungs twice a day  5. Advanced care planning/counseling discussion  - discussed plan of care and current medications    Family/ staff Communication: Discussed plan of care with IDT.  Labs/tests ordered:  CBC, HgbA1C and BMP  Goals of care:   Long-term care.   Durenda Age, NP [facility] and Adult Medicine [phone] (Monday-Friday 8:00 a.m. - 5:00 p.m.) [phone] (after hours)

## 2018-09-06 DIAGNOSIS — N179 Acute kidney failure, unspecified: Secondary | ICD-10-CM | POA: Diagnosis not present

## 2018-09-06 DIAGNOSIS — D649 Anemia, unspecified: Secondary | ICD-10-CM | POA: Diagnosis not present

## 2018-09-06 DIAGNOSIS — E119 Type 2 diabetes mellitus without complications: Secondary | ICD-10-CM | POA: Diagnosis not present

## 2018-09-06 DIAGNOSIS — I872 Venous insufficiency (chronic) (peripheral): Secondary | ICD-10-CM | POA: Diagnosis not present

## 2018-09-06 DIAGNOSIS — A000 Cholera due to Vibrio cholerae 01, biovar cholerae: Secondary | ICD-10-CM | POA: Diagnosis not present

## 2018-09-06 LAB — CBC AND DIFFERENTIAL
HCT: 32 — AB (ref 36–46)
HEMOGLOBIN: 10.7 — AB (ref 12.0–16.0)
Neutrophils Absolute: 11
PLATELETS: 422 — AB (ref 150–399)
WBC: 14.5

## 2018-09-06 LAB — BASIC METABOLIC PANEL
BUN: 16 (ref 4–21)
CREATININE: 1.2 — AB (ref 0.5–1.1)
Glucose: 123
Potassium: 4.3 (ref 3.4–5.3)
Sodium: 138 (ref 137–147)

## 2018-09-06 LAB — HEMOGLOBIN A1C: Hemoglobin A1C: 5.9

## 2018-09-07 DIAGNOSIS — E119 Type 2 diabetes mellitus without complications: Secondary | ICD-10-CM | POA: Diagnosis not present

## 2018-09-07 DIAGNOSIS — H524 Presbyopia: Secondary | ICD-10-CM | POA: Diagnosis not present

## 2018-09-07 DIAGNOSIS — H04123 Dry eye syndrome of bilateral lacrimal glands: Secondary | ICD-10-CM | POA: Diagnosis not present

## 2018-09-07 DIAGNOSIS — H25813 Combined forms of age-related cataract, bilateral: Secondary | ICD-10-CM | POA: Diagnosis not present

## 2018-09-07 LAB — HM DIABETES EYE EXAM

## 2018-09-27 ENCOUNTER — Non-Acute Institutional Stay (SKILLED_NURSING_FACILITY): Payer: Medicare Other | Admitting: Internal Medicine

## 2018-09-27 ENCOUNTER — Encounter: Payer: Self-pay | Admitting: Internal Medicine

## 2018-09-27 DIAGNOSIS — E041 Nontoxic single thyroid nodule: Secondary | ICD-10-CM

## 2018-09-27 DIAGNOSIS — E119 Type 2 diabetes mellitus without complications: Secondary | ICD-10-CM

## 2018-09-27 DIAGNOSIS — D649 Anemia, unspecified: Secondary | ICD-10-CM | POA: Diagnosis not present

## 2018-09-27 DIAGNOSIS — R4189 Other symptoms and signs involving cognitive functions and awareness: Secondary | ICD-10-CM | POA: Diagnosis not present

## 2018-09-27 DIAGNOSIS — N939 Abnormal uterine and vaginal bleeding, unspecified: Secondary | ICD-10-CM | POA: Diagnosis not present

## 2018-09-27 DIAGNOSIS — D497 Neoplasm of unspecified behavior of endocrine glands and other parts of nervous system: Secondary | ICD-10-CM | POA: Diagnosis not present

## 2018-09-27 DIAGNOSIS — R29818 Other symptoms and signs involving the nervous system: Secondary | ICD-10-CM | POA: Insufficient documentation

## 2018-09-27 NOTE — Patient Instructions (Addendum)
See assessment and plan under each diagnosis in the problem list and acutely for this visit Total time 46  minutes; greater than 50% of the visit spent  coordinating care for problems addressed at this encounter

## 2018-09-27 NOTE — Assessment & Plan Note (Signed)
Initial evaluation by gynecology needed with subsequent evaluation by endocrinologist as clinically indicated by those findings Repeat CBC and differential and iron panel

## 2018-09-27 NOTE — Assessment & Plan Note (Signed)
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

## 2018-09-27 NOTE — Progress Notes (Signed)
NURSING HOME LOCATION:  [facility] ROOM NUMBER:    CODE STATUS:    PCP:  Cooper Render, MD   This is a nursing facility follow up of chronic medical diagnoses  Interim medical record and care since last [facility] visit was updated with review of diagnostic studies and change in clinical status since last visit were documented.  HPI: The patient has been a resident of the facility since 06/05/2018, admitted following encephalopathic episode.  After admission [facility]UMS testing suggested neurocognitive deficit /dementia. Other issues included postmenopausal bleeding for which gynecologic follow-up has been attempted.  Serial CBCs have shown a progression of her anemia with hemoglobin dropping from 12.6 & hematocrit 37.7 down to values of 10.7 and 32.  Additionally there has been slight progression of the creatinine elevation with a value of 1.24 on 2/13.  This is in the context of taking Janumet 50/1000 twice daily.  A1c is prediabetic at 5.9%.  At the time of admission her sulfonylurea was discontinued. Glucoses have ranged from 94-131.  Review of systems: Dementia invalidated responses. She gave the date as September 26, 2018 to the nurse practitioner student but told me it was September 27, 1918.SLUMS mental status testing was repeated today and revealed a value of 15 out of 30 compatible with dementia. Also her history suggests hallucinations.  She told me that she had seen her mother within the last month and actually talked to her although she has been told her mother was dead.  This phenomena does not alarm her. She describes having clotting for at least 3 days with  active vaginal bleeding.  She has not seen the gynecologist yet.  Her PCP had attempted to arrange gynecologic consultation but apparently this was never completed.  She has no other bleeding dyscrasias. She did describe an intermittent cough over the last year.  When I asked what date last year that it began; she stated "Fall  in the late 90s".  She feels that her cough is aggravated by air conditioning exposure.  States that her sputum will vary from clear to green.  Also told the NP student that she had numbness in the right hand and left foot for the last 2 days.  She fixates on wanting to return home, stating that she will get an aide to help.  Physical exam:  Pertinent or positive findings: Her thyroid is smooth and appears mildly enlarged, asymmetrically so.  The left lobe appears larger than the right.  She has diffuse low-grade rhonchi  & nonproductive cough.  Grade 1 systolic murmur is noted.  Pedal pulses are decreased.  She has thickened "leathery" changes over the shins with some papular character over the left shin compared to the right.  There is no definite pitting.  The feet and ankles almost appear "wrinkled".  Toenails are elongated and deformed.  She has clubbing of the fingernails.  Strength opposition is fair.  Deep tendon reflexes  are 0-1/2+.  General appearance: Adequately nourished; no acute distress, increased work of breathing is present.   Lymphatic: No lymphadenopathy about the head, neck, axilla. Eyes: No conjunctival inflammation or lid edema is present. There is no scleral icterus. Ears:  External ear exam shows no significant lesions or deformities.   Nose:  External nasal examination shows no deformity or inflammation. Nasal mucosa are pink and moist without lesions, exudates Oral exam:  Lips and gums are healthy appearing. There is no oropharyngeal erythema or exudate. Neck:  No thyromegaly, masses, tenderness noted.  Heart:  Normal rate and regular rhythm. S1 and S2 normal without gallop, click, rub .  Abdomen: Bowel sounds are normal. Abdomen is soft and nontender with no organomegaly, hernias, masses. GU: Deferred  Extremities:  No cyanosis Neurologic exam : Balance, Rhomberg, finger to nose testing could not be completed due to clinical state Skin: Warm & dry w/o tenting. No  significant  rash.  See summary under each active problem in the Problem List with associated updated therapeutic plan

## 2018-09-27 NOTE — Assessment & Plan Note (Addendum)
Contact Dr. Reesa Chew to arrange gynecologic follow-up for the vaginal bleeding with progressive anemia and  endocrinologic follow-up for  thyroid nodule and pituitary adenoma

## 2018-09-27 NOTE — Assessment & Plan Note (Signed)
A1c is prediabetic Creatinine has risen in the context of Janumet 50/1000 twice daily She will be changed to metformin 500 mg twice daily and renal function monitored

## 2018-09-27 NOTE — Assessment & Plan Note (Signed)
Psych NP assessment

## 2018-09-27 NOTE — Assessment & Plan Note (Signed)
Hemoglobin 10.7/hematocrit 32 down from values of 12.6/37.7 CBC Arrange gynecologic consultation

## 2018-09-28 DIAGNOSIS — R6889 Other general symptoms and signs: Secondary | ICD-10-CM | POA: Diagnosis not present

## 2018-09-28 DIAGNOSIS — D649 Anemia, unspecified: Secondary | ICD-10-CM | POA: Diagnosis not present

## 2018-09-28 LAB — IRON,TIBC AND FERRITIN PANEL
%SAT: 19.69
IRON: 35
TIBC: 176
UIBC: 141

## 2018-09-28 LAB — CBC AND DIFFERENTIAL
HCT: 32 — AB (ref 36–46)
Hemoglobin: 10.7 — AB (ref 12.0–16.0)
Neutrophils Absolute: 10
Platelets: 412 — AB (ref 150–399)
WBC: 13.6

## 2018-10-02 ENCOUNTER — Other Ambulatory Visit: Payer: Self-pay

## 2018-10-02 ENCOUNTER — Encounter: Payer: Self-pay | Admitting: Internal Medicine

## 2018-10-02 ENCOUNTER — Ambulatory Visit (INDEPENDENT_AMBULATORY_CARE_PROVIDER_SITE_OTHER): Payer: Medicare Other | Admitting: Internal Medicine

## 2018-10-02 VITALS — BP 132/80 | HR 104 | Ht 61.0 in

## 2018-10-02 DIAGNOSIS — D497 Neoplasm of unspecified behavior of endocrine glands and other parts of nervous system: Secondary | ICD-10-CM

## 2018-10-02 DIAGNOSIS — R1311 Dysphagia, oral phase: Secondary | ICD-10-CM | POA: Diagnosis not present

## 2018-10-02 DIAGNOSIS — Z8701 Personal history of pneumonia (recurrent): Secondary | ICD-10-CM | POA: Diagnosis not present

## 2018-10-02 DIAGNOSIS — M6281 Muscle weakness (generalized): Secondary | ICD-10-CM | POA: Diagnosis not present

## 2018-10-02 DIAGNOSIS — E042 Nontoxic multinodular goiter: Secondary | ICD-10-CM | POA: Diagnosis not present

## 2018-10-02 DIAGNOSIS — I872 Venous insufficiency (chronic) (peripheral): Secondary | ICD-10-CM | POA: Diagnosis not present

## 2018-10-02 DIAGNOSIS — E559 Vitamin D deficiency, unspecified: Secondary | ICD-10-CM | POA: Diagnosis not present

## 2018-10-02 DIAGNOSIS — Z9181 History of falling: Secondary | ICD-10-CM | POA: Diagnosis not present

## 2018-10-02 DIAGNOSIS — I10 Essential (primary) hypertension: Secondary | ICD-10-CM | POA: Diagnosis not present

## 2018-10-02 LAB — VITAMIN D 25 HYDROXY (VIT D DEFICIENCY, FRACTURES): VITD: 13.69 ng/mL — ABNORMAL LOW (ref 30.00–100.00)

## 2018-10-02 LAB — CORTISOL: Cortisol, Plasma: 10.1 ug/dL

## 2018-10-02 LAB — TSH: TSH: 1.89 u[IU]/mL (ref 0.35–4.50)

## 2018-10-02 LAB — T3, FREE: T3, Free: 3.8 pg/mL (ref 2.3–4.2)

## 2018-10-02 LAB — T4, FREE: Free T4: 1.01 ng/dL (ref 0.60–1.60)

## 2018-10-02 LAB — LUTEINIZING HORMONE: LH: 7.64 m[IU]/mL

## 2018-10-02 LAB — FOLLICLE STIMULATING HORMONE: FSH: 16.6 m[IU]/mL

## 2018-10-02 NOTE — Patient Instructions (Addendum)
Please stop at the lab.  You will be called to schedule a new Thyroid Ultrasound.  Please come back for a follow-up appointment in 1 year.

## 2018-10-02 NOTE — Progress Notes (Addendum)
Patient ID: Mariah [name], female   DOB: 1951/04/17, 68 y.o.   MRN: [id number]    HPI  Mariah [name] is a 68 y.o.-year-old female, initially referred by her PCP, Dr. Linna Darner, for preoperative evaluation for pituitary adenoma, now returning for follow-up after transsphenoidal surgery.  Last visit 11 months ago.  Patient has been diagnosed with a pituitary adenoma in 09/2017 after having the visual field checked by Dr. Katy Fitch.  She had a pituitary MRI which showed a macroadenoma. He admitted her to the SNF.  She was referred to neurosurgery (Dr. Kathyrn Sheriff).  Before surgery, she was referred to endocrinology for presurgical evaluation.  I saw the patient in 10/2017 and at that time hormonal work-up was negative for excessive pituitary hormone production or hormone deficiency:  Component     Latest Ref Rng & Units 10/31/2017  FSH     mIU/mL 8.3  LH     mIU/mL 9.3  Prolactin     4.8 - 23.3 ng/mL 29.6 (H)  TSH     0.350 - 4.500 uIU/mL 2.749  Somatomedin C     42 - 169 ng/mL 96  T4,Free(Direct)     0.61 - 1.12 ng/dL 1.17 (H)   Component     Latest Ref Rng & Units 11/30/2017  PROLACTIN,UNDILUTED     2.0 - 30.0 ng/mL 22.0  PROLACTIN,DILUTED     ng/mL   Cortisol, Plasma     ug/dL 9.8  C206 ACTH     6 - 50 pg/mL 42   Reviewed imaging test reports:  10/03/2017: Pituitary MRI [facility]): There is moderately large 2.4 x 3.3 x 2.7 cm mass in the sella and suprasellar region. Some uplifting of the structures of the brain base attenuation in the round carotid vessels and circle of Willis vessels as above. Extension towards the left orbit and  left middle skull base. This is likely a large, insinuated adenoma.  11/01/2017: Pituitary MRI to rule out apoplexy: SELLA: Expansile sellar/suprasellar 1.8 by 2.8 by 3.1 cm mass is relatively unchanged. Mass demonstrates intermediate T2-2, low T1 homogeneous enhancement with reduced diffusion. No susceptibility artifact. Persistent LEFT greater than  RIGHT cavernous sinus invasion. Tenting of the optic chiasm and A1-2 junction. No dural tail.  Of note, patient had transsphenoidal surgery on 01/19/2018: PITUITARY ADENOMA WITH NUMEROUS NON-NECROTIZING, WELL-FORMED EPITHELIOID GRANULOMAS The specimen is received in saline and consists of a 1.6 x 1.0 x 0.2 cm aggregate of tan-pink softened tissue. The specimen is entirely submitted in one cassette.  Am Cortisol postop was normal: Component     Latest Ref Rng & Units 01/20/2018  Cortisol - AM     6.7 - 22.6 ug/dL 13.1   However, CT of the head (05/31/2018) continues to show the pituitary mass (?): Stable large sellar and suprasellar mass is noted consistent with macro adenoma as noted on prior MRI.  She also has a history of encephalopathy after falling from a chair on 10/30/2017.She was seen by Dr. Linna Darner after she was admitted to the hospital after the fall.   She has a history of hypothyroidism and is on replacement with levothyroxine 100 mcg daily.  She also has a history of thyroid nodules, for which a biopsy was ordered by PCP. Lab Results  Component Value Date   TSH 2.749 10/31/2017   There is no history of nephrolithiasis, GI tumors, peptic ulcer disease, diarrhea, galactorrhea.  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  She has had hypocalcemia,  not hypercalcemia. This may have been related to vitamin D deficiency (see below): Lab Results  Component Value Date   PTH 20 11/30/2017   PTH Comment 11/30/2017   Lab Results  Component Value Date   CALCIUM 8.6 (L) 06/04/2018   CALCIUM 8.5 (L) 06/03/2018   CALCIUM 8.1 (L) 06/02/2018   CALCIUM 8.7 (L) 06/01/2018   CALCIUM 9.0 05/31/2018   CALCIUM 8.9 04/25/2018   CALCIUM 8.0 (L) 01/23/2018   CALCIUM 8.5 (L) 01/22/2018   CALCIUM 7.6 (L) 01/20/2018   CALCIUM 8.8 (L) 01/19/2018   CALCIUM 8.9 12/19/2017   CALCIUM 9.1 11/30/2017   CALCIUM 8.8 (L) 11/07/2017   CALCIUM 8.4 (L) 11/01/2017   CALCIUM 8.6  (L) 10/31/2017   Latest vitamin D level was low: Lab Results  Component Value Date   VD25OH 13.65 (L) 11/30/2017  I suggested to start 5000 units vitamin D daily.  However, reviewing the records from the facility, she is not on vitamin D now.  She has a history of diabetes.  Latest HbA1c was excellent: Lab Results  Component Value Date   HGBA1C 5.9 09/06/2018  Of note, patient also has type 2 diabetes and was on sulfonylurea.  She presented to the hospital on 11/07/2017 with a glucose of 28.  Of note, at that time, she was on glipizide 20 mg in the morning, which, however, was not given the day of the admission.  Since then, sulfonylurea was stopped and she did not have any more hypoglycemic episodes.  No previous history of hypoglycemic episodes.  She also has a history of thyroid nodules per review of the ultrasound from 09/26/2017.  She has 2 large nodules, 1 of 4.2 x 2.4 x 2.1 cm, solid, isoechoic and the other 2.1 x 1.4 x 1.1 cm mixed cystic and solid, isoechoic, both in the inferior isthmus.  The largest nodule qualified for biopsy.  This was ordered by PCP but patient did not have this done yet.  Pt denies: - feeling nodules in neck - hoarseness - dysphagia - choking - SOB with lying down  She has a history of HTN.  ROS: Constitutional: no weight gain/+ weight loss, no fatigue, no subjective hyperthermia, no subjective hypothermia Eyes: no blurry vision, no xerophthalmia ENT: no sore throat,See HPI Cardiovascular: no CP/no SOB/no palpitations/+ leg swelling Respiratory: + Cough/no SOB/+ wheezing Gastrointestinal: no N/no V/no D/no C/no acid reflux Musculoskeletal: no muscle aches/+ joint aches Skin: +  rash on legs-chronic, no hair loss Neurological: no tremors/no numbness/no tingling/no dizziness  I reviewed pt's medications, allergies, PMH, social hx, family hx, and changes were documented in the history of present illness. Otherwise, unchanged from my initial visit  note.  Past Medical History:  Diagnosis Date  . Allergic rhinitis   . Arthritis    "ankles, feet, knees" (10/30/2017)  . Asthma   . Bursitis   . Carpal tunnel syndrome   . GERD (gastroesophageal reflux disease)   . HTN (hypertension)   . Migraine    "a couple/yr" (10/30/2017)  . Obesity   . Peripheral vascular disease ([place])   . Pneumonia 1960s X 1  . Sciatica   . Tendinitis   . Type II diabetes mellitus (HCC)    Type II  . UTI (urinary tract infection)    Past Surgical History:  Procedure Laterality Date  . CRANIOTOMY N/A 01/19/2018   Procedure: ENDOSCOPIC TRANSPHENOIDAL RESECTION OF PITUITARY TUMOR;  Surgeon: Consuella Lose, MD;  Location: [facility];  Service: Neurosurgery;  Laterality: N/A;  ENDOSCOPIC TRANSPHENOIDAL RESECTION OF PITUITARY TUMOR  . GANGLION CYST EXCISION Left   . KNEE ARTHROSCOPY Left 2003  . PLANTAR FASCIA RELEASE Right 1995   "heel"  . SINUS ENDO WITH FUSION N/A 01/19/2018   Procedure: SINUS ENDO WITH FUSION;  Surgeon: Jerrell Belfast, MD;  Location: [facility];  Service: ENT;  Laterality: N/A;  . TONSILLECTOMY  1958  . TRANSPHENOIDAL APPROACH EXPOSURE N/A 01/19/2018   Procedure: TRANSPHENOIDAL  RESECTION OF PITUITARY TUMOR;  Surgeon: Jerrell Belfast, MD;  Location: [facility];  Service: ENT;  Laterality: N/A;  . [place]; 1997   Dr [name]   Social History   Socioeconomic History  . Marital status: Single    Spouse name: Not on file  . Number of children: 0  . Years of education: Not on file  . Highest education level: Not on file  Occupational History  . 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 Financial resource strain: Not on file  . Food insecurity:    Worry: Not on file    Inability: Not on file  . Transportation needs:    Medical: Not on file    Non-medical: Not on file  Tobacco Use  . Smoking status: Never Smoker  . Smokeless tobacco: Never  Used  Substance and Sexual Activity  . Alcohol use: No  . Drug use: No  . Sexual activity: Not Currently    Birth control/protection: None  Lifestyle  . Physical activity:    Days per week: Not on file    Minutes per session: Not on file  . Stress: Not on file  Relationships  . Social connections:    Talks on phone: Not on file    Gets together: Not on file    Attends religious service: Not on file    Active member of club or organization: Not on file    Attends meetings of clubs or organizations: Not on file    Relationship status: Not on file  . Intimate partner violence:    Fear of current or ex partner: Not on file    Emotionally abused: Not on file    Physically abused: Not on file    Forced sexual activity: Not on file  Other Topics Concern  . Not on file  Social History Narrative   Does not speak to brother   Previously taught at [employer], etc and a dorm supervisor   Single, but dating   Current Outpatient Medications on File Prior to Visit  Medication Sig Dispense Refill  . acetaminophen (TYLENOL) 325 MG tablet Take 650 mg by mouth every 6 (six) hours as needed for mild pain or moderate pain.    [name] ADVAIR DISKUS 100-50 MCG/DOSE AEPB Inhale 1 puff into the lungs 2 (two) times daily. 60 each 1  . albuterol (PROVENTIL HFA;VENTOLIN HFA) 108 (90 Base) MCG/ACT inhaler Inhale 2 puffs into the lungs every 6 (six) hours as needed for wheezing or shortness of breath. 18 g 2  . amLODipine (NORVASC) 10 MG tablet Take 1 tablet (10 mg total) by mouth daily. PATIENT MUST BE SEEN FOR ADDITIONAL REFILLS 30 tablet 0  . bisacodyl (DULCOLAX) 10 MG suppository Constipation (2 of 4): If not relieved by MOM, give 10 mg Bisacodyl suppositiory rectally X 1 dose in 24 hours as needed (Do not use constipation standing orders for residents with renal failure/CFR less than  54. Contact MD for orders)    . esomeprazole (NEXIUM) 20 MG capsule Take 1 capsule (20 mg total) by mouth every morning. For  acid reflex 30 capsule 0  . furosemide (LASIX) 40 MG tablet TAKE ONE TABLET BY MOUTH EVERY DAY 90 tablet 0  . JANUMET 50-1000 MG tablet TAKE ONE TABLET BY MOUTH TWICE DAILY WITH MEALS 180 tablet 0  . levETIRAcetam (KEPPRA) 500 MG tablet Take 1 tablet (500 mg total) by mouth 2 (two) times daily. 60 tablet 0  . lisinopril (PRINIVIL,ZESTRIL) 40 MG tablet Take 40 mg by mouth daily.   11  . magnesium hydroxide (MILK OF MAGNESIA) 400 MG/5ML suspension Constipation (1 of 4): If no BM in 3 days, give 30 cc Milk of Magnesium p.o. x 1 dose in 24 hours as needed (Do not use standing constipation orders for residents with renal failure CFR less than 30. Contact MD for orders)    . mometasone (NASONEX) 50 MCG/ACT nasal spray Place 1 spray into the nose as needed. 17 g 0  . naproxen (NAPROSYN) 500 MG tablet Take 500 mg by mouth 2 (two) times daily as needed for moderate pain (Joint pain). Take after meals    . Nutritional Supplements (NUTRITIONAL SUPPLEMENT PO) Take 1 each by mouth daily. Magic Cup     No current facility-administered medications on file prior to visit.    Allergies  Allergen Reactions  . Penicillins Hives and Other (See Comments)    ++ got keflex in 2013 and 2019++ Has patient had a PCN reaction causing immediate rash, facial/tongue/throat swelling, SOB or lightheadedness with hypotension: No Has patient had a PCN reaction causing severe rash involving mucus membranes or skin necrosis: No PATIENT HAS HAD A PCN REACTION THAT REQUIRED HOSPITALIZATION:  #  #  YES  #  #  Has patient had a PCN reaction occurring within the last 10 years: No   . Codeine Nausea And Vomiting  . Indomethacin Diarrhea   Family History  Problem Relation Age of Onset  . Uterine cancer Mother   . Hypertension Mother   . Heart disease Mother   . Hypertension Father   . Alzheimer's disease Father   . Cancer Other     PE: BP 132/80   Pulse (!) 104   Ht 5\' 1"  (1.549 m)   LMP 12/19/2017   SpO2 98%   BMI  33.03 kg/m Patient is in wheelchair- was not weighed today. Wt Readings from Last 3 Encounters:  09/27/18 174 lb 12.8 oz (79.3 kg)  09/05/18 181 lb 6.4 oz (82.3 kg)  08/07/18 181 lb 6.4 oz (82.3 kg)   Constitutional: overweight, in NAD, + in wheelchair Eyes: PERRLA, EOMI, no exophthalmos ENT: moist mucous membranes, no thyromegaly, no cervical lymphadenopathy Cardiovascular: No tachycardia at the end of the exam, RRR, No MRG Respiratory: CTA B Gastrointestinal: abdomen soft, NT, ND, BS+ Musculoskeletal: no deformities, strength intact in all 4 Skin: moist, warm, + dark maculopapular rash on legs-chronic Neurological: no tremor with outstretched hands, DTR normal in all 4  ASSESSMENT: 1. Pituitary macroadenoma  2.  Vitamin D deficiency  3.  Thyroid nodule  PLAN:  1. Patient with history of pituitary macroadenoma abutting the optic chiasm, incidentally found during investigation for headaches.  At last visit, approximately a year ago, investigation for hormone over- or under-production was negative.  She had TSR with Dr. Kathyrn Sheriff on 01/19/2018.  Of note, on a.m. cortisol level obtained after the surgery was normal.  She was lost  for follow-up with him and with me afterwards.  She was then admitted in 05/2018 with altered mental status and a CT scan without contrast was read as showing stable large sellar and suprasellar mass consistent with macroadenoma... -At this visit, we discussed about the findings on her recent CT.  I will get in touch with Dr. Kathyrn Sheriff to see if we can plan a follow-up with him especially as she will probably need another MRI. -At today's visit, we will recheck her am hormone levels.  2.  Vitamin D deficiency -At last visit, vitamin D was very low and I suggested to start 5000 units vitamin D daily -Reviewing the records from the facility, she is not given this -Recheck vitamin D level today  3.  Patient has a history of 2 thyroid nodules for review of  ultrasound report from 09/26/2017.  One of them qualify for FNA, which was ordered by PCP, but patient did not have this yet. -At this visit, we will recheck her thyroid ultrasound and will most likely need an FNA. -We will also check TFTs today - no neck compression sxs  Orders Placed This Encounter  Procedures  . US Soft Tissue Head/Neck  . TSH  . T4, free  . T3, free  . Cortisol  . ACTH  . Insulin-like growth factor  . Follicle stimulating hormone  . Luteinizing hormone  . Prolactin  . VITAMIN D 25 Hydroxy (Vit-D Deficiency, Fractures)   Component     Latest Ref Rng & Units 10/02/2018  TSH     0.35 - 4.50 uIU/mL 1.89  FSH     mIU/ML 16.6  LH     mIU/mL 7.64  Prolactin     ng/mL 14.8  T4,Free(Direct)     0.60 - 1.60 ng/dL 1.01  Cortisol, Plasma     ug/dL 10.1  C206 ACTH     6 - 50 pg/mL 23  VITD     30.00 - 100.00 ng/mL 13.69 (L)  Triiodothyronine,Free,Serum     2.3 - 4.2 pg/mL 3.8   Component     Latest Ref Rng & Units 10/02/2018  IGF-I, LC/MS     41 - 279 ng/mL 87  Z-Score (Female)     -2.0 - 2 SD -0.6   Vitamin D level is very low.  I will again suggest to start 5000 units vitamin D daily.  This can be followed by PCP or, if she prefers, she can return in 2 months for another vitamin D level. The pituitary labs are normal, with decreased LH and FSH which were also present before her surgery.  I will forward the results to Dr. Kathyrn Sheriff to see if patient can follow with him and have another MRI.  Philemon Kingdom, MD PhD [facility] End[facility]

## 2018-10-03 DIAGNOSIS — Z8701 Personal history of pneumonia (recurrent): Secondary | ICD-10-CM | POA: Diagnosis not present

## 2018-10-03 DIAGNOSIS — Z9181 History of falling: Secondary | ICD-10-CM | POA: Diagnosis not present

## 2018-10-03 DIAGNOSIS — M6281 Muscle weakness (generalized): Secondary | ICD-10-CM | POA: Diagnosis not present

## 2018-10-03 DIAGNOSIS — I10 Essential (primary) hypertension: Secondary | ICD-10-CM | POA: Diagnosis not present

## 2018-10-03 DIAGNOSIS — I872 Venous insufficiency (chronic) (peripheral): Secondary | ICD-10-CM | POA: Diagnosis not present

## 2018-10-03 DIAGNOSIS — R1311 Dysphagia, oral phase: Secondary | ICD-10-CM | POA: Diagnosis not present

## 2018-10-04 ENCOUNTER — Other Ambulatory Visit: Payer: Self-pay | Admitting: Internal Medicine

## 2018-10-04 DIAGNOSIS — I872 Venous insufficiency (chronic) (peripheral): Secondary | ICD-10-CM | POA: Diagnosis not present

## 2018-10-04 DIAGNOSIS — Z9181 History of falling: Secondary | ICD-10-CM | POA: Diagnosis not present

## 2018-10-04 DIAGNOSIS — Z7984 Long term (current) use of oral hypoglycemic drugs: Secondary | ICD-10-CM | POA: Diagnosis not present

## 2018-10-04 DIAGNOSIS — Z8701 Personal history of pneumonia (recurrent): Secondary | ICD-10-CM | POA: Diagnosis not present

## 2018-10-04 DIAGNOSIS — I10 Essential (primary) hypertension: Secondary | ICD-10-CM | POA: Diagnosis not present

## 2018-10-04 DIAGNOSIS — M6281 Muscle weakness (generalized): Secondary | ICD-10-CM | POA: Diagnosis not present

## 2018-10-04 DIAGNOSIS — N950 Postmenopausal bleeding: Secondary | ICD-10-CM

## 2018-10-04 DIAGNOSIS — E1140 Type 2 diabetes mellitus with diabetic neuropathy, unspecified: Secondary | ICD-10-CM | POA: Diagnosis not present

## 2018-10-04 DIAGNOSIS — B351 Tinea unguium: Secondary | ICD-10-CM | POA: Diagnosis not present

## 2018-10-04 DIAGNOSIS — R1311 Dysphagia, oral phase: Secondary | ICD-10-CM | POA: Diagnosis not present

## 2018-10-05 DIAGNOSIS — I10 Essential (primary) hypertension: Secondary | ICD-10-CM | POA: Diagnosis not present

## 2018-10-05 DIAGNOSIS — Z8701 Personal history of pneumonia (recurrent): Secondary | ICD-10-CM | POA: Diagnosis not present

## 2018-10-05 DIAGNOSIS — I872 Venous insufficiency (chronic) (peripheral): Secondary | ICD-10-CM | POA: Diagnosis not present

## 2018-10-05 DIAGNOSIS — M6281 Muscle weakness (generalized): Secondary | ICD-10-CM | POA: Diagnosis not present

## 2018-10-05 DIAGNOSIS — R1311 Dysphagia, oral phase: Secondary | ICD-10-CM | POA: Diagnosis not present

## 2018-10-05 DIAGNOSIS — Z9181 History of falling: Secondary | ICD-10-CM | POA: Diagnosis not present

## 2018-10-06 LAB — INSULIN-LIKE GROWTH FACTOR
IGF-I, LC/MS: 87 ng/mL (ref 41–279)
Z-Score (Female): -0.6 SD (ref ?–2.0)

## 2018-10-06 LAB — PROLACTIN: Prolactin: 14.8 ng/mL

## 2018-10-06 LAB — TIQ-NTM

## 2018-10-06 LAB — ACTH: C206 ACTH: 23 pg/mL (ref 6–50)

## 2018-10-08 ENCOUNTER — Telehealth: Payer: Self-pay

## 2018-10-08 DIAGNOSIS — I872 Venous insufficiency (chronic) (peripheral): Secondary | ICD-10-CM | POA: Diagnosis not present

## 2018-10-08 DIAGNOSIS — Z8701 Personal history of pneumonia (recurrent): Secondary | ICD-10-CM | POA: Diagnosis not present

## 2018-10-08 DIAGNOSIS — Z9181 History of falling: Secondary | ICD-10-CM | POA: Diagnosis not present

## 2018-10-08 DIAGNOSIS — I10 Essential (primary) hypertension: Secondary | ICD-10-CM | POA: Diagnosis not present

## 2018-10-08 DIAGNOSIS — R1311 Dysphagia, oral phase: Secondary | ICD-10-CM | POA: Diagnosis not present

## 2018-10-08 DIAGNOSIS — M6281 Muscle weakness (generalized): Secondary | ICD-10-CM | POA: Diagnosis not present

## 2018-10-08 NOTE — Telephone Encounter (Signed)
Left message for patient to return our call at [phone].  

## 2018-10-08 NOTE — Telephone Encounter (Signed)
-----   Message from Philemon Kingdom, MD sent at 10/05/2018  2:47 PM EDT ----- Lenna Sciara, can you please call pt:  Vitamin D level is very low.  Please start start 5000 units vitamin D daily.  This can be followed by PCP or, if she prefers, she can return in 2 months for another vitamin D level. In this case, can you please order this? The pituitary labs are normal, but I am still waiting for the growth hormone to return.  I do not suspect any surprises there, though. I forwarded the results to Dr. Kathyrn Sheriff to see if patient can follow with him and have another pituitary MRI.

## 2018-10-09 ENCOUNTER — Encounter: Payer: Self-pay | Admitting: Internal Medicine

## 2018-10-09 ENCOUNTER — Non-Acute Institutional Stay (SKILLED_NURSING_FACILITY): Payer: Medicare Other | Admitting: Internal Medicine

## 2018-10-09 DIAGNOSIS — E119 Type 2 diabetes mellitus without complications: Secondary | ICD-10-CM | POA: Diagnosis not present

## 2018-10-09 DIAGNOSIS — D497 Neoplasm of unspecified behavior of endocrine glands and other parts of nervous system: Secondary | ICD-10-CM

## 2018-10-09 DIAGNOSIS — R4189 Other symptoms and signs involving cognitive functions and awareness: Secondary | ICD-10-CM | POA: Diagnosis not present

## 2018-10-09 DIAGNOSIS — R1311 Dysphagia, oral phase: Secondary | ICD-10-CM | POA: Diagnosis not present

## 2018-10-09 DIAGNOSIS — D649 Anemia, unspecified: Secondary | ICD-10-CM

## 2018-10-09 DIAGNOSIS — E559 Vitamin D deficiency, unspecified: Secondary | ICD-10-CM

## 2018-10-09 DIAGNOSIS — E041 Nontoxic single thyroid nodule: Secondary | ICD-10-CM

## 2018-10-09 DIAGNOSIS — Z9181 History of falling: Secondary | ICD-10-CM | POA: Diagnosis not present

## 2018-10-09 DIAGNOSIS — I10 Essential (primary) hypertension: Secondary | ICD-10-CM | POA: Diagnosis not present

## 2018-10-09 DIAGNOSIS — R29818 Other symptoms and signs involving the nervous system: Secondary | ICD-10-CM

## 2018-10-09 DIAGNOSIS — M6281 Muscle weakness (generalized): Secondary | ICD-10-CM | POA: Diagnosis not present

## 2018-10-09 DIAGNOSIS — N939 Abnormal uterine and vaginal bleeding, unspecified: Secondary | ICD-10-CM

## 2018-10-09 DIAGNOSIS — I872 Venous insufficiency (chronic) (peripheral): Secondary | ICD-10-CM | POA: Diagnosis not present

## 2018-10-09 DIAGNOSIS — Z8701 Personal history of pneumonia (recurrent): Secondary | ICD-10-CM | POA: Diagnosis not present

## 2018-10-09 NOTE — Progress Notes (Signed)
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  Interim medical record and care since last [facility] visit was updated with review of diagnostic studies and change in clinical status since last visit were documented.  HPI: Dr Cruzita Lederer, Endocrinology , saw the patient 10/02/2018 in follow-up.  She had seen the patient in April 2019 prior to her transsphenoidal neurosurgical procedure by Dr. Kathyrn Sheriff 01/19/2018.She found no evidence of hormonal abnormalities or MEN syndrome.  Specifically prolactin was mildly elevated at 29.6 with normals less than 23.3.  FSH was 8.3 & LH 9.3.  Surgical pathology revealed pituitary adenoma with numerous nons necrotizing, well-formed epithelioid granulomas. She documented that CT of the head 06/10/2018 continued to show a pituitary mass.  It is described as stable large sellar and suprasellar mass consistent with macroadenoma as noted on prior MRI.  Dr Cruzita Lederer also documented vitamin D deficiency with hypocalcemia.  She recommended 5000 units of vitamin D daily. The patient had previously been on glipizide for diabetes but had presented to the ED 11/07/2017 with a glucose of 28.  There have been no additional hypoglycemic episodes off the sulfonylurea. Ultrasound of the thyroid 09/26/2017 had revealed 2 large nodules; solid/isoechoic 4.2 x 2.4 x 2.1 cm and the second 2.1 x 1.4 x 1.1 cm with mixed cystic and solid/ isoechoic character.  Both nodules were in the inferior isthmus.  The larger of the 2 qualified for FNA biopsy. FNA had been ordered by Dr. Reesa Chew, but this was not completed, most likely due to her neurocognitive issues. The patient has a history of encephalopathy.  Neurocognitive testing suggests dementia.  On 09/27/2018 [facility] mental status testing revealed a  result of 15 out of 30.Her neurocognitive deficit has been associated with auditory & visual hallucinations of seeing & talking to her deceased mother.  Dr Reesa Chew has made a referral to gynecology for the postmenopausal bleeding.  She had exhibited a drop in hemoglobin/hematocrit.  On 06/07/2018 hemoglobin was 12.6/hematocrit 38.  On 2/13 values were 10.7/32.  These were unchanged 09/28/2018; indices were normochromic, normocytic.  Review of systems: Dementia invalidated responses.  She initially did not remember having had neurosurgery.  She denied having seen the endocrinologist recently.  She gave the NP student history of having red blood clots from her vagina last night but then changed it to intermittent bleeding since Christmas.  She told me that she is not having active vaginal bleeding but will occasionally notice 1 or 2 drops of blood on the sheets.  She states that since she is not changing her self she might not see vaginal bleeding.  She describes a 20 pound weight loss which she relates to not liking the food at the SNF.  She had difficulty defining which relatives are alive.  She states that her niece Rubye Beach is her power of attorney.  She attempted to find her phone number by looking through a large stack of SNF daily menus she had hoarded in her purse.   She admits to hearing voices and seeing her dead mother.  She fixated on getting a new wheelchair when she talked to the NP student.  She feels she is unable to ambulate and describes pain in the left knee.  Constitutional: No fever, fatigue  Eyes: No redness, discharge, pain,  vision change ENT/mouth: No nasal congestion,  purulent discharge, earache, change in hearing, sore throat  Cardiovascular: No chest pain, palpitations, paroxysmal nocturnal dyspnea, claudication, edema  Respiratory: No cough, sputum production, hemoptysis, DOE, significant snoring, apnea   Gastrointestinal: No heartburn, dysphagia, abdominal pain, nausea  /vomiting, rectal bleeding, melena, change in bowels Genitourinary: No dysuria, hematuria, pyuria, incontinence, nocturia Dermatologic: No rash, pruritus, change in appearance of skin Neurologic: No dizziness, headache, syncope, seizures, numbness, tingling Psychiatric: No significant anxiety, depression, insomnia, anorexia Endocrine: No change in hair/skin/nails, excessive thirst, excessive hunger, excessive urination  Hematologic/lymphatic: No significant bruising, lymphadenopathy Allergy/immunology: No itchy/watery eyes, significant sneezing, urticaria, angioedema  Physical exam:  Pertinent or positive findings: There is a dramatic discordance between her appearance and demeanor and the history suggestive of significant neurocognitive deficit with hallucinations of both auditory and visual nature.  Additionally she appears to have hoarding behavior based on the number of menus she had in her pocketbook.  She did stop talking to the NP nurse when a PT staff member entered the room.  She also mentioned to me that she had to take her valuables with her when she left the room "or they would not be there when I get back".  She had the TV remote inside her blouse. No significant visual field deficit could be documented. The thyroid is smooth but appears asymmetric with the right lobe greater than the left.  I cannot palpate nodules. Low grade rhonchi present bilaterally. Fusiform enlargement of the knees are present.  The large toenails are deformed.  She has nonpitting edema of the lower extremities.  There is hyperpigmented, hyper keratotic changes to the shins, greater on the left than the right.  Pedal pulses are decreased, especially posterior tibial pulses.  General appearance: Adequately nourished; no acute distress, increased work of breathing is present.   Lymphatic: No lymphadenopathy about the head, neck, axilla. Eyes: No conjunctival inflammation or lid edema is present. There is no scleral  icterus. Ears:  External ear exam shows no significant lesions or deformities.   Nose:  External nasal examination shows no deformity or inflammation. Nasal mucosa are pink and moist without lesions, exudates Oral exam:  Lips and gums are healthy appearing. There is no oropharyngeal erythema or exudate. Neck:  No masses, tenderness noted.    Heart:  Normal rate and regular rhythm. S1 and S2 normal without gallop, murmur, click, rub .  Abdomen: Bowel sounds are normal. Abdomen is soft and nontender with no organomegaly, hernias, masses. GU: Deferred  Extremities:  No cyanosis, clubbing  Neurologic exam : Balance, Rhomberg, finger to nose testing could not be completed due to clinical state Skin: Warm & dry w/o tenting. No significant  rash.  See summary under each active problem in the Problem List with associated updated therapeutic plan

## 2018-10-09 NOTE — Assessment & Plan Note (Signed)
The abnormal CT scan results will be followed by Dr Ricarda Frame, NS

## 2018-10-09 NOTE — Assessment & Plan Note (Addendum)
Patient lacks capacity to schedule and keep subspecialty consultations. Transport will be arranged from the SNF to the gynecologist office and all other subspecialty consultations and procedures.

## 2018-10-09 NOTE — Assessment & Plan Note (Addendum)
FNA can be pursued once the CT scan suggesting residual pituitary tumor and the postmenopausal bleeding issues have been evaluated

## 2018-10-09 NOTE — Assessment & Plan Note (Addendum)
Dr Mariah [name] has requested gynecologic consultation /17/2020 as of her neurocognitive deficit, she cannot define when and to what extent she has had vaginal bleeding

## 2018-10-09 NOTE — Patient Instructions (Addendum)
See assessment and plan under each diagnosis in the problem list and acutely for this visit Total time 52  minutes; greater than 50% of the visit spent attempting to counsel patient and coordinating care for multiple problems addressed at this encounter

## 2018-10-09 NOTE — Assessment & Plan Note (Addendum)
Serial glucoses at the SNF have all been well controlled with a range of  74-114.

## 2018-10-09 NOTE — Assessment & Plan Note (Signed)
06/07/2018 hemoglobin 12.6/hematocrit 38 09/06/2018 and 09/28/2018 hemoglobin 10.7/hematocrit 32 Anemia is stable but needs further evaluation because of history of postmenopausal bleeding

## 2018-10-10 ENCOUNTER — Encounter: Payer: Self-pay | Admitting: Internal Medicine

## 2018-10-10 DIAGNOSIS — I872 Venous insufficiency (chronic) (peripheral): Secondary | ICD-10-CM | POA: Diagnosis not present

## 2018-10-10 DIAGNOSIS — Z8701 Personal history of pneumonia (recurrent): Secondary | ICD-10-CM | POA: Diagnosis not present

## 2018-10-10 DIAGNOSIS — M6281 Muscle weakness (generalized): Secondary | ICD-10-CM | POA: Diagnosis not present

## 2018-10-10 DIAGNOSIS — I10 Essential (primary) hypertension: Secondary | ICD-10-CM | POA: Diagnosis not present

## 2018-10-10 DIAGNOSIS — E559 Vitamin D deficiency, unspecified: Secondary | ICD-10-CM | POA: Insufficient documentation

## 2018-10-10 DIAGNOSIS — Z9181 History of falling: Secondary | ICD-10-CM | POA: Diagnosis not present

## 2018-10-10 DIAGNOSIS — R1311 Dysphagia, oral phase: Secondary | ICD-10-CM | POA: Diagnosis not present

## 2018-10-10 NOTE — Telephone Encounter (Signed)
Left message for patient to return our call at [phone].  

## 2018-10-11 DIAGNOSIS — Z8701 Personal history of pneumonia (recurrent): Secondary | ICD-10-CM | POA: Diagnosis not present

## 2018-10-11 DIAGNOSIS — I872 Venous insufficiency (chronic) (peripheral): Secondary | ICD-10-CM | POA: Diagnosis not present

## 2018-10-11 DIAGNOSIS — R1311 Dysphagia, oral phase: Secondary | ICD-10-CM | POA: Diagnosis not present

## 2018-10-11 DIAGNOSIS — M6281 Muscle weakness (generalized): Secondary | ICD-10-CM | POA: Diagnosis not present

## 2018-10-11 DIAGNOSIS — Z9181 History of falling: Secondary | ICD-10-CM | POA: Diagnosis not present

## 2018-10-11 DIAGNOSIS — I10 Essential (primary) hypertension: Secondary | ICD-10-CM | POA: Diagnosis not present

## 2018-10-12 DIAGNOSIS — M6281 Muscle weakness (generalized): Secondary | ICD-10-CM | POA: Diagnosis not present

## 2018-10-12 DIAGNOSIS — I10 Essential (primary) hypertension: Secondary | ICD-10-CM | POA: Diagnosis not present

## 2018-10-12 DIAGNOSIS — R1311 Dysphagia, oral phase: Secondary | ICD-10-CM | POA: Diagnosis not present

## 2018-10-12 DIAGNOSIS — Z9181 History of falling: Secondary | ICD-10-CM | POA: Diagnosis not present

## 2018-10-12 DIAGNOSIS — Z8701 Personal history of pneumonia (recurrent): Secondary | ICD-10-CM | POA: Diagnosis not present

## 2018-10-12 DIAGNOSIS — I872 Venous insufficiency (chronic) (peripheral): Secondary | ICD-10-CM | POA: Diagnosis not present

## 2018-10-13 DIAGNOSIS — E559 Vitamin D deficiency, unspecified: Secondary | ICD-10-CM | POA: Diagnosis not present

## 2018-10-13 DIAGNOSIS — G9340 Encephalopathy, unspecified: Secondary | ICD-10-CM | POA: Diagnosis not present

## 2018-10-15 ENCOUNTER — Other Ambulatory Visit: Payer: Self-pay | Admitting: Internal Medicine

## 2018-10-15 DIAGNOSIS — I10 Essential (primary) hypertension: Secondary | ICD-10-CM | POA: Diagnosis not present

## 2018-10-15 DIAGNOSIS — I872 Venous insufficiency (chronic) (peripheral): Secondary | ICD-10-CM | POA: Diagnosis not present

## 2018-10-15 DIAGNOSIS — Z9181 History of falling: Secondary | ICD-10-CM | POA: Diagnosis not present

## 2018-10-15 DIAGNOSIS — R1311 Dysphagia, oral phase: Secondary | ICD-10-CM | POA: Diagnosis not present

## 2018-10-15 DIAGNOSIS — Z8701 Personal history of pneumonia (recurrent): Secondary | ICD-10-CM | POA: Diagnosis not present

## 2018-10-15 DIAGNOSIS — M6281 Muscle weakness (generalized): Secondary | ICD-10-CM | POA: Diagnosis not present

## 2018-10-15 DIAGNOSIS — E042 Nontoxic multinodular goiter: Secondary | ICD-10-CM

## 2018-10-15 NOTE — Telephone Encounter (Signed)
Letter sent.

## 2018-10-16 DIAGNOSIS — Z9181 History of falling: Secondary | ICD-10-CM | POA: Diagnosis not present

## 2018-10-16 DIAGNOSIS — M6281 Muscle weakness (generalized): Secondary | ICD-10-CM | POA: Diagnosis not present

## 2018-10-16 DIAGNOSIS — I10 Essential (primary) hypertension: Secondary | ICD-10-CM | POA: Diagnosis not present

## 2018-10-16 DIAGNOSIS — Z8701 Personal history of pneumonia (recurrent): Secondary | ICD-10-CM | POA: Diagnosis not present

## 2018-10-16 DIAGNOSIS — E1151 Type 2 diabetes mellitus with diabetic peripheral angiopathy without gangrene: Secondary | ICD-10-CM | POA: Diagnosis not present

## 2018-10-16 DIAGNOSIS — R1311 Dysphagia, oral phase: Secondary | ICD-10-CM | POA: Diagnosis not present

## 2018-10-16 DIAGNOSIS — E559 Vitamin D deficiency, unspecified: Secondary | ICD-10-CM | POA: Diagnosis not present

## 2018-10-16 DIAGNOSIS — I872 Venous insufficiency (chronic) (peripheral): Secondary | ICD-10-CM | POA: Diagnosis not present

## 2018-10-17 DIAGNOSIS — I872 Venous insufficiency (chronic) (peripheral): Secondary | ICD-10-CM | POA: Diagnosis not present

## 2018-10-17 DIAGNOSIS — Z9181 History of falling: Secondary | ICD-10-CM | POA: Diagnosis not present

## 2018-10-17 DIAGNOSIS — Z8701 Personal history of pneumonia (recurrent): Secondary | ICD-10-CM | POA: Diagnosis not present

## 2018-10-17 DIAGNOSIS — R1311 Dysphagia, oral phase: Secondary | ICD-10-CM | POA: Diagnosis not present

## 2018-10-17 DIAGNOSIS — I10 Essential (primary) hypertension: Secondary | ICD-10-CM | POA: Diagnosis not present

## 2018-10-17 DIAGNOSIS — M6281 Muscle weakness (generalized): Secondary | ICD-10-CM | POA: Diagnosis not present

## 2018-10-18 DIAGNOSIS — Z9181 History of falling: Secondary | ICD-10-CM | POA: Diagnosis not present

## 2018-10-18 DIAGNOSIS — I872 Venous insufficiency (chronic) (peripheral): Secondary | ICD-10-CM | POA: Diagnosis not present

## 2018-10-18 DIAGNOSIS — Z8701 Personal history of pneumonia (recurrent): Secondary | ICD-10-CM | POA: Diagnosis not present

## 2018-10-18 DIAGNOSIS — I10 Essential (primary) hypertension: Secondary | ICD-10-CM | POA: Diagnosis not present

## 2018-10-18 DIAGNOSIS — M6281 Muscle weakness (generalized): Secondary | ICD-10-CM | POA: Diagnosis not present

## 2018-10-18 DIAGNOSIS — R1311 Dysphagia, oral phase: Secondary | ICD-10-CM | POA: Diagnosis not present

## 2018-10-19 DIAGNOSIS — M6281 Muscle weakness (generalized): Secondary | ICD-10-CM | POA: Diagnosis not present

## 2018-10-19 DIAGNOSIS — R1311 Dysphagia, oral phase: Secondary | ICD-10-CM | POA: Diagnosis not present

## 2018-10-19 DIAGNOSIS — I872 Venous insufficiency (chronic) (peripheral): Secondary | ICD-10-CM | POA: Diagnosis not present

## 2018-10-19 DIAGNOSIS — Z8701 Personal history of pneumonia (recurrent): Secondary | ICD-10-CM | POA: Diagnosis not present

## 2018-10-19 DIAGNOSIS — I10 Essential (primary) hypertension: Secondary | ICD-10-CM | POA: Diagnosis not present

## 2018-10-19 DIAGNOSIS — Z9181 History of falling: Secondary | ICD-10-CM | POA: Diagnosis not present

## 2018-10-22 DIAGNOSIS — R1311 Dysphagia, oral phase: Secondary | ICD-10-CM | POA: Diagnosis not present

## 2018-10-22 DIAGNOSIS — Z8701 Personal history of pneumonia (recurrent): Secondary | ICD-10-CM | POA: Diagnosis not present

## 2018-10-22 DIAGNOSIS — I10 Essential (primary) hypertension: Secondary | ICD-10-CM | POA: Diagnosis not present

## 2018-10-22 DIAGNOSIS — I872 Venous insufficiency (chronic) (peripheral): Secondary | ICD-10-CM | POA: Diagnosis not present

## 2018-10-22 DIAGNOSIS — Z9181 History of falling: Secondary | ICD-10-CM | POA: Diagnosis not present

## 2018-10-22 DIAGNOSIS — M6281 Muscle weakness (generalized): Secondary | ICD-10-CM | POA: Diagnosis not present

## 2018-10-23 DIAGNOSIS — Z8701 Personal history of pneumonia (recurrent): Secondary | ICD-10-CM | POA: Diagnosis not present

## 2018-10-23 DIAGNOSIS — M6281 Muscle weakness (generalized): Secondary | ICD-10-CM | POA: Diagnosis not present

## 2018-10-23 DIAGNOSIS — I872 Venous insufficiency (chronic) (peripheral): Secondary | ICD-10-CM | POA: Diagnosis not present

## 2018-10-23 DIAGNOSIS — I10 Essential (primary) hypertension: Secondary | ICD-10-CM | POA: Diagnosis not present

## 2018-10-23 DIAGNOSIS — R1311 Dysphagia, oral phase: Secondary | ICD-10-CM | POA: Diagnosis not present

## 2018-10-23 DIAGNOSIS — Z9181 History of falling: Secondary | ICD-10-CM | POA: Diagnosis not present

## 2018-10-24 ENCOUNTER — Ambulatory Visit: Payer: Medicare Other | Admitting: Podiatry

## 2018-10-24 DIAGNOSIS — Z8701 Personal history of pneumonia (recurrent): Secondary | ICD-10-CM | POA: Diagnosis not present

## 2018-10-24 DIAGNOSIS — I10 Essential (primary) hypertension: Secondary | ICD-10-CM | POA: Diagnosis not present

## 2018-10-24 DIAGNOSIS — M6281 Muscle weakness (generalized): Secondary | ICD-10-CM | POA: Diagnosis not present

## 2018-10-24 DIAGNOSIS — I872 Venous insufficiency (chronic) (peripheral): Secondary | ICD-10-CM | POA: Diagnosis not present

## 2018-10-24 DIAGNOSIS — R1311 Dysphagia, oral phase: Secondary | ICD-10-CM | POA: Diagnosis not present

## 2018-10-24 DIAGNOSIS — Z9181 History of falling: Secondary | ICD-10-CM | POA: Diagnosis not present

## 2018-10-25 ENCOUNTER — Encounter: Payer: Self-pay | Admitting: Internal Medicine

## 2018-10-25 ENCOUNTER — Non-Acute Institutional Stay (SKILLED_NURSING_FACILITY): Payer: Medicare Other | Admitting: Internal Medicine

## 2018-10-25 DIAGNOSIS — I10 Essential (primary) hypertension: Secondary | ICD-10-CM | POA: Diagnosis not present

## 2018-10-25 DIAGNOSIS — E119 Type 2 diabetes mellitus without complications: Secondary | ICD-10-CM

## 2018-10-25 DIAGNOSIS — R1311 Dysphagia, oral phase: Secondary | ICD-10-CM | POA: Diagnosis not present

## 2018-10-25 DIAGNOSIS — K0889 Other specified disorders of teeth and supporting structures: Secondary | ICD-10-CM

## 2018-10-25 DIAGNOSIS — Z9181 History of falling: Secondary | ICD-10-CM | POA: Diagnosis not present

## 2018-10-25 DIAGNOSIS — M6281 Muscle weakness (generalized): Secondary | ICD-10-CM | POA: Diagnosis not present

## 2018-10-25 DIAGNOSIS — Z8701 Personal history of pneumonia (recurrent): Secondary | ICD-10-CM | POA: Diagnosis not present

## 2018-10-25 DIAGNOSIS — I872 Venous insufficiency (chronic) (peripheral): Secondary | ICD-10-CM | POA: Diagnosis not present

## 2018-10-25 NOTE — Progress Notes (Signed)
Location:    [facility] Room Number: 218/B Place of Service:  SNF [phone]) Provider:  Carlis Abbott, MD  Patient Care Team: [name], MD as PCP - General (Internal Medicine)  Extended Emergency Contact Information Primary Emergency Contact: Gita, Dilger Home Phone: [phone] Relation: Sister Secondary Emergency Contact: Sandria Manly Mobile Phone: [phone] Relation: Niece  Code Status:  Full Code Goals of care: Advanced Directive information Advanced Directives 10/25/2018  Does Patient Have a Medical Advance Directive? Yes  Type of Advance Directive (No Data)  Does patient want to make changes to medical advance directive? No - Patient declined  Would patient like information on creating a medical advance directive? -  Pre-existing out of facility DNR order (yellow form or pink MOST form) -     Chief Complaint  Patient presents with  . Acute Visit    Toothache    HPI:  Pt is a 68 y.o. female seen today for an acute visit for complaints of intermittent tooth pain left lower mouth. Patient is a long-term resident of facility 3 pituitary tumor which is followed by endocrinology--has neurocognitive disorder- vitamin D deficiency on supplementation-  asthma and hypertension as well as type 2 diabetes.  Patient is seen today for complaints of a pain in her left lower mouth she attributes to a toothache-she says the pain is intermittent and not persistent.  Vital signs appear to be stable- temperature is 99.0 she does not complain of any fever chills or difficulty swallowing.  In regards to diabetes she has some history of hypoglycemia but this is been stable on current dose of Glucophage 500 twice daily sulfonylurea has been discontinued  Recent blood sugars appear to run from the 80s up to the lower 100s-hemoglobin A1c was 5.9 in February.  Currently she is sitting in her wheelchair comfortably       Past Medical History:  Diagnosis Date  . Allergic rhinitis   . Arthritis    "ankles, feet, knees" (10/30/2017)  . Asthma   . Bursitis   . Carpal tunnel syndrome   . GERD (gastroesophageal reflux disease)   . HTN (hypertension)   . Migraine    "a couple/yr" (10/30/2017)  . Obesity   . Peripheral vascular disease ([place])   . Pneumonia 1960s X 1  . Sciatica   . Tendinitis   . Type II diabetes mellitus (HCC)    Type II  . UTI (urinary tract infection)    Past Surgical History:  Procedure Laterality Date  . CRANIOTOMY N/A 01/19/2018   Procedure: ENDOSCOPIC TRANSPHENOIDAL RESECTION OF PITUITARY TUMOR;  Surgeon: Consuella Lose, MD;  Location: [facility];  Service: Neurosurgery;  Laterality: N/A;  ENDOSCOPIC TRANSPHENOIDAL RESECTION OF PITUITARY TUMOR  . GANGLION CYST EXCISION Left   . KNEE ARTHROSCOPY Left 2003  . PLANTAR FASCIA RELEASE Right 1995   "heel"  . SINUS ENDO WITH FUSION N/A 01/19/2018   Procedure: SINUS ENDO WITH FUSION;  Surgeon: Jerrell Belfast, MD;  Location: [facility];  Service: ENT;  Laterality: N/A;  . TONSILLECTOMY  1958  . TRANSPHENOIDAL APPROACH EXPOSURE N/A 01/19/2018   Procedure: TRANSPHENOIDAL  RESECTION OF PITUITARY TUMOR;  Surgeon: Jerrell Belfast, MD;  Location: [facility];  Service: ENT;  Laterality: N/A;  . [place]; 1997   Dr [name]    Allergies  Allergen Reactions  . Penicillins Hives and Other (See Comments)    ++ got keflex in 2013 and 2019++  Has patient had a PCN reaction causing immediate rash, facial/tongue/throat swelling, SOB or lightheadedness with hypotension: No Has patient had a PCN reaction causing severe rash involving mucus membranes or skin necrosis: No PATIENT HAS HAD A PCN REACTION THAT REQUIRED HOSPITALIZATION:  #  #  YES  #  #  Has patient had a PCN reaction occurring within the last 10 years: No   . Codeine Nausea And Vomiting  . Indomethacin Diarrhea    Outpatient Encounter Medications as of 10/25/2018  Medication Sig   . acetaminophen (TYLENOL) 325 MG tablet Take 650 mg by mouth every 6 (six) hours as needed for mild pain or moderate pain.  [name] ADVAIR DISKUS 100-50 MCG/DOSE AEPB Inhale 1 puff into the lungs 2 (two) times daily.  [name] albuterol (PROVENTIL HFA;VENTOLIN HFA) 108 (90 Base) MCG/ACT inhaler Inhale 2 puffs into the lungs every 6 (six) hours as needed for wheezing or shortness of breath.  [name] amLODipine (NORVASC) 10 MG tablet Take 1 tablet (10 mg total) by mouth daily. PATIENT MUST BE SEEN FOR ADDITIONAL REFILLS  . bisacodyl (DULCOLAX) 10 MG suppository Constipation (2 of 4): If not relieved by MOM, give 10 mg Bisacodyl suppositiory rectally X 1 dose in 24 hours as needed (Do not use constipation standing orders for residents with renal failure/CFR less than 30. Contact MD for orders)  . Carboxymethylcellul-Glycerin (REFRESH OPTIVE OP) Apply to eye. Give 1 drop QID OU x 365 days  . Cholecalciferol (VITAMIN D3) 125 MCG (5000 UT) CAPS GIVE 1 CAPSULE BY MOUTH ONCE DAILY  . esomeprazole (NEXIUM) 20 MG capsule Take 1 capsule (20 mg total) by mouth every morning. For acid reflex  . furosemide (LASIX) 40 MG tablet TAKE ONE TABLET BY MOUTH EVERY DAY  . levETIRAcetam (KEPPRA) 500 MG tablet Take 1 tablet (500 mg total) by mouth 2 (two) times daily.  [name] lisinopril (PRINIVIL,ZESTRIL) 40 MG tablet Take 40 mg by mouth daily.   . magnesium hydroxide (MILK OF MAGNESIA) 400 MG/5ML suspension Constipation (1 of 4): If no BM in 3 days, give 30 cc Milk of Magnesium p.o. x 1 dose in 24 hours as needed (Do not use standing constipation orders for residents with renal failure CFR less than 30. Contact MD for orders)  . metFORMIN (GLUCOPHAGE) 500 MG tablet Take 500 mg by mouth 2 (two) times daily with a meal.  . mometasone (NASONEX) 50 MCG/ACT nasal spray Place 1 spray into the nose as needed.  . naproxen (NAPROSYN) 500 MG tablet Take 500 mg by mouth 2 (two) times daily as needed for moderate pain (Joint pain). Take after meals  .  Nutritional Supplements (NUTRITIONAL SUPPLEMENT PO) Take 1 each by mouth daily. Magic Cup   No facility-administered encounter medications on file as of 10/25/2018.     Review of Systems   Review of systems is somewhat limited secondary to neurocognitive disorder.  But she is not complaining of any fever chills difficulty swallowing shortness of breath she does have a history of asthma.  She is not complaining of any headache or dizziness.    Immunization History  Administered Date(s) Administered  . Influenza Split 03/08/2013  . Influenza Whole 09/03/2009, 06/03/2010  . Influenza,inj,Quad PF,6+ Mos 06/25/2014  . Influenza-Unspecified 06/08/2017  . Pneumococcal Polysaccharide-23 07/25/2002, 10/31/2017  . Td 07/25/2005   Pertinent  Health Maintenance Due  Topic Date Due  . FOOT EXAM  11/23/2018 (Originally 09/01/2018)  . MAMMOGRAM  11/24/2018 (Originally 01/14/2017)  . OPHTHALMOLOGY EXAM  11/24/2018 (Originally 09[birth year]02/1962)  .  DEXA SCAN  11/24/2018 (Originally 09/02/2017)  . COLONOSCOPY  11/24/2018 (Originally 09/02/2002)  . PNA vac Low Risk Adult (2 of 2 - PCV13) 11/01/2018  . HEMOGLOBIN A1C  12/05/2018  . INFLUENZA VACCINE  02/23/2019   Fall Risk  03/06/2018 10/20/2017 09/01/2017 08/15/2017 07/28/2017  Falls in the past year? Yes Yes Yes Yes Yes  Number falls in past yr: 1 2 or more 2 or more 1 2 or more  Comment - - 7 times  - -  Injury with Fall? Yes Yes No No Yes  Comment - Broken jaw - - -  Risk Factor Category  High Fall Risk High Fall Risk High Fall Risk High Fall Risk High Fall Risk  Risk for fall due to : Impaired balance/gait History of fall(s);Impaired mobility;Impaired balance/gait Impaired balance/gait;Impaired mobility Impaired balance/gait;Impaired mobility History of fall(s);Medication side effect;Impaired mobility  Follow up Falls prevention discussed - Falls prevention discussed Education provided Falls prevention discussed   Functional Status Survey:    Temperature is  99.0 pulse 86 respirations 18 blood pressure 117/78  Physical Exam   In general this is a pleasant female in no distress sitting comfortably in a wheelchair.  Her skin is warm and dry.  Eyes visual acuity appears to be intact.  Oropharynx is clear mucous membranes moist- left lower mouth there appears to be a couple areas of tooth decay-some tenderness to palpation of the tooth itself could not really appreciate any bleeding blisters.  Or gum abnormalities  Neck could not really appreciate adenopathy-.  Chest he does have some diffuse rhonchi that clear significantly with cough there is no labored breathing.  Heart is regular rate and rhythm without murmur gallop or rub.  Abdomen is soft nontender with positive bowel sounds.  Musculoskeletal is able move all extremities x4.  Neurologic appears grossly intact her speech is clear.  Psych she is oriented to self is pleasant can carry on a short conversation but has some confusion at times.     Labs reviewed: Recent Labs    06/02/18 0356 06/03/18 0548 06/04/18 0501 06/07/18 07/06/18 09/06/18  NA 137 136 136 140 142 138  K 3.4* 3.7 3.6 4.3 4.4 4.3  CL 106 104 104  --   --   --   CO2 23 25 25   --   --   --   GLUCOSE 104* 137* 126*  --   --   --   BUN 12 13 17  25* 21 16  CREATININE 1.05* 1.09* 0.91 0.8 1.1 1.2*  CALCIUM 8.1* 8.5* 8.6*  --   --   --    Recent Labs    11/07/17 1212 04/25/18 0635 05/31/18 1401  AST 23 18 34  ALT 20 13 17   ALKPHOS 76 67 62  BILITOT 0.7 0.4 0.6  PROT 8.5* 9.0* 9.1*  ALBUMIN 3.3* 3.2* 3.4*   Recent Labs    06/01/18 0035 06/03/18 0548 06/04/18 0501 06/07/18 09/06/18 09/28/18  WBC 12.9* 13.5* 13.7* 16.9 14.5 13.6  NEUTROABS 9.6*  --   --  14 11 10   HGB 12.0 10.5* 11.3* 12.6 10.7* 10.7*  HCT 37.9 34.1* 35.8* 38 32* 32*  MCV 90.2 90.2 89.5  --   --   --   PLT 395 424* 415* 433* 422* 412*   Lab Results  Component Value Date   TSH 1.89 10/02/2018   Lab Results  Component Value  Date   HGBA1C 5.9 09/06/2018   Lab Results  Component Value Date  CHOL 178 08/30/2012   HDL 49 08/30/2012   LDLCALC 92 08/30/2012   TRIG 187 (H) 08/30/2012   CHOLHDL 3.6 08/30/2012    Significant Diagnostic Results in last 30 days:  No results found.  Assessment/Plan  #1 history of toothache-will write an order for dental clinic-again obtaining an outside dental consult is quite challenging because of the coronavirus situation but will continue to monitor have written order to monitor her vital signs and notify provider if this pain is persistent or increases-at this point pain appears to be controlled she is says this is intermittent pain and may be sensitive to temperature it appears.  She does have orders for Naprosyn and Tylenol.  2.  Type 2 diabetes this appears stable on Glucophage recent blood sugars ranging from the 80s to lower 100s per chart review.  3.  Hypertension this appears stable blood pressure 117/78 today- she is on lisinopril 40 mg a day as well as Norvasc 10 mg a day in addition to Lasix 40 mg a day appears recent systolics 5/64/3329 range occasionally spike but this is not persistent  4 history of asthma this appears stable has some congestion but this clears significantly with cough there is no labored breathing or no wheezing that I could ascertain-continues on Proventil as needed as well as Advair twice a day  [id number] .

## 2018-10-26 ENCOUNTER — Non-Acute Institutional Stay (SKILLED_NURSING_FACILITY): Payer: Medicare Other | Admitting: Internal Medicine

## 2018-10-26 ENCOUNTER — Encounter: Payer: Self-pay | Admitting: Internal Medicine

## 2018-10-26 DIAGNOSIS — Z9181 History of falling: Secondary | ICD-10-CM | POA: Diagnosis not present

## 2018-10-26 DIAGNOSIS — K0889 Other specified disorders of teeth and supporting structures: Secondary | ICD-10-CM

## 2018-10-26 DIAGNOSIS — R1311 Dysphagia, oral phase: Secondary | ICD-10-CM | POA: Diagnosis not present

## 2018-10-26 DIAGNOSIS — I10 Essential (primary) hypertension: Secondary | ICD-10-CM | POA: Diagnosis not present

## 2018-10-26 DIAGNOSIS — M6281 Muscle weakness (generalized): Secondary | ICD-10-CM | POA: Diagnosis not present

## 2018-10-26 DIAGNOSIS — I872 Venous insufficiency (chronic) (peripheral): Secondary | ICD-10-CM | POA: Diagnosis not present

## 2018-10-26 DIAGNOSIS — Z8701 Personal history of pneumonia (recurrent): Secondary | ICD-10-CM | POA: Diagnosis not present

## 2018-10-26 NOTE — Progress Notes (Signed)
Location:    [facility] Room Number: 218/B Place of Service:  SNF [phone]) Provider:  Carlis Abbott, MD  Patient Care Team: [name], MD as PCP - General (Internal Medicine)  Extended Emergency Contact Information Primary Emergency Contact: Ottilia, [place] Home Phone: [phone] Relation: Sister Secondary Emergency Contact: Sandria Manly Mobile Phone: [phone] Relation: Niece  Code Status:  Full Code Goals of care: Advanced Directive information Advanced Directives 10/26/2018  Does Patient Have a Medical Advance Directive? Yes  Type of Advance Directive (No Data)  Does patient want to make changes to medical advance directive? No - Patient declined  Would patient like information on creating a medical advance directive? -  Pre-existing out of facility DNR order (yellow form or pink MOST form) -     Chief Complaint  Patient presents with  . Acute Visit    F/U Toothache    HPI:  Pt is a 68 y.o. female seen today for an acute visit for follow-up of a toothache.  Patient was seen yesterday for complaints of of a toothache left lower mouth.  On evaluation appeared she did have some decayed teeth in that area I could not appreciate any drainage bleeding gum abnormalities.  Follow-up today she says the pain is intermittent currently is not hurting.  We have ordered dentist to see her when dentistry is in facility again obtaining psych consult is difficult because of the coronavirus situation.  At this point pain appears to be controlled she does have Naprosyn and Tylenol.  Vital signs are stable she is afebrile   Past Medical History:  Diagnosis Date  . Allergic rhinitis   . Arthritis    "ankles, feet, knees" (10/30/2017)  . Asthma   . Bursitis   . Carpal tunnel syndrome   . GERD (gastroesophageal reflux disease)   . HTN (hypertension)   . Migraine    "a couple/yr" (10/30/2017)  . Obesity   .  Peripheral vascular disease ([place])   . Pneumonia 1960s X 1  . Sciatica   . Tendinitis   . Type II diabetes mellitus ([facility])    Type II  . UTI (urinary tract infection)    Past Surgical History:  Procedure Laterality Date  . CRANIOTOMY N/A 01/19/2018   Procedure: ENDOSCOPIC TRANSPHENOIDAL RESECTION OF PITUITARY TUMOR;  Surgeon: Consuella Lose, MD;  Location: [facility];  Service: Neurosurgery;  Laterality: N/A;  ENDOSCOPIC TRANSPHENOIDAL RESECTION OF PITUITARY TUMOR  . GANGLION CYST EXCISION Left   . KNEE ARTHROSCOPY Left 2003  . PLANTAR FASCIA RELEASE Right 1995   "heel"  . SINUS ENDO WITH FUSION N/A 01/19/2018   Procedure: SINUS ENDO WITH FUSION;  Surgeon: Jerrell Belfast, MD;  Location: [facility];  Service: ENT;  Laterality: N/A;  . TONSILLECTOMY  1958  . TRANSPHENOIDAL APPROACH EXPOSURE N/A 01/19/2018   Procedure: TRANSPHENOIDAL  RESECTION OF PITUITARY TUMOR;  Surgeon: Jerrell Belfast, MD;  Location: [facility];  Service: ENT;  Laterality: N/A;  . [place]; 1997   Dr [name]    Allergies  Allergen Reactions  . Penicillins Hives and Other (See Comments)    ++ got keflex in 2013 and 2019++ Has patient had a PCN reaction causing immediate rash, facial/tongue/throat swelling, SOB or lightheadedness with hypotension: No Has patient had a PCN reaction causing severe rash involving mucus membranes or skin necrosis: No PATIENT HAS HAD A PCN REACTION THAT REQUIRED HOSPITALIZATION:  #  #  YES  #  #  Has patient had a PCN reaction occurring within the last 10 years: No   . Codeine Nausea And Vomiting  . Indomethacin Diarrhea    Outpatient Encounter Medications as of 10/26/2018  Medication Sig  . acetaminophen (TYLENOL) 325 MG tablet Take 650 mg by mouth every 6 (six) hours as needed for mild pain or moderate pain.  [name] ADVAIR DISKUS 100-50 MCG/DOSE AEPB Inhale 1 puff into the lungs 2 (two) times daily.  [name] albuterol (PROVENTIL HFA;VENTOLIN HFA) 108 (90 Base) MCG/ACT inhaler Inhale 2  puffs into the lungs every 6 (six) hours as needed for wheezing or shortness of breath.  [name] amLODipine (NORVASC) 10 MG tablet Take 1 tablet (10 mg total) by mouth daily. PATIENT MUST BE SEEN FOR ADDITIONAL REFILLS  . bisacodyl (DULCOLAX) 10 MG suppository Constipation (2 of 4): If not relieved by MOM, give 10 mg Bisacodyl suppositiory rectally X 1 dose in 24 hours as needed (Do not use constipation standing orders for residents with renal failure/CFR less than 30. Contact MD for orders)  . Carboxymethylcellul-Glycerin (REFRESH OPTIVE OP) Apply to eye. Give 1 drop QID OU x 365 days  . Cholecalciferol (VITAMIN D3) 125 MCG (5000 UT) CAPS GIVE 1 CAPSULE BY MOUTH ONCE DAILY  . esomeprazole (NEXIUM) 20 MG capsule Take 1 capsule (20 mg total) by mouth every morning. For acid reflex  . furosemide (LASIX) 40 MG tablet TAKE ONE TABLET BY MOUTH EVERY DAY  . levETIRAcetam (KEPPRA) 500 MG tablet Take 1 tablet (500 mg total) by mouth 2 (two) times daily.  [name] lisinopril (PRINIVIL,ZESTRIL) 40 MG tablet Take 40 mg by mouth daily.   . magnesium hydroxide (MILK OF MAGNESIA) 400 MG/5ML suspension Constipation (1 of 4): If no BM in 3 days, give 30 cc Milk of Magnesium p.o. x 1 dose in 24 hours as needed (Do not use standing constipation orders for residents with renal failure CFR less than 30. Contact MD for orders)  . metFORMIN (GLUCOPHAGE) 500 MG tablet Take 500 mg by mouth 2 (two) times daily with a meal.  . mometasone (NASONEX) 50 MCG/ACT nasal spray Place 1 spray into the nose as needed.  . naproxen (NAPROSYN) 500 MG tablet Take 500 mg by mouth 2 (two) times daily as needed for moderate pain (Joint pain). Take after meals  . Nutritional Supplements (NUTRITIONAL SUPPLEMENT PO) Take 1 each by mouth daily. Magic Cup   No facility-administered encounter medications on file as of 10/26/2018.     Review of Systems   Patient is a somewhat unreliable historian because of neurocognitive disorder but currently is not  complaining of any tooth pain shortness of breath chest pain-she does have a history of asthma but does not complain of shortness of breath today.  Does not complain of any difficulty swallowing  Immunization History  Administered Date(s) Administered  . Influenza Split 03/08/2013  . Influenza Whole 09/03/2009, 06/03/2010  . Influenza,inj,Quad PF,6+ Mos 06/25/2014  . Influenza-Unspecified 06/08/2017  . Pneumococcal Polysaccharide-23 07/25/2002, 10/31/2017  . Td 07/25/2005   Pertinent  Health Maintenance Due  Topic Date Due  . FOOT EXAM  11/23/2018 (Originally 09/01/2018)  . MAMMOGRAM  11/24/2018 (Originally 01/14/2017)  . OPHTHALMOLOGY EXAM  11/24/2018 (Originally 09/02/1962)  . DEXA SCAN  11/24/2018 (Originally 09/02/2017)  . COLONOSCOPY  11/24/2018 (Originally 09/02/2002)  . PNA vac Low Risk Adult (2 of 2 - PCV13) 11/01/2018  . HEMOGLOBIN A1C  12/05/2018  . INFLUENZA VACCINE  02/23/2019   Fall Risk  03/06/2018 10/20/2017 09/01/2017 08/15/2017 07/28/2017  Falls in the past year? Yes Yes Yes Yes Yes  Number falls in past yr: 1 2 or more 2 or more 1 2 or more  Comment - - 7 times  - -  Injury with Fall? Yes Yes No No Yes  Comment - Broken jaw - - -  Risk Factor Category  High Fall Risk High Fall Risk High Fall Risk High Fall Risk High Fall Risk  Risk for fall due to : Impaired balance/gait History of fall(s);Impaired mobility;Impaired balance/gait Impaired balance/gait;Impaired mobility Impaired balance/gait;Impaired mobility History of fall(s);Medication side effect;Impaired mobility  Follow up Falls prevention discussed - Falls prevention discussed Education provided Falls prevention discussed   Functional Status Survey:    Temperature is 98.8 pulse 82 respirations 20 blood pressure 106/60 O2 saturation is 96% on room air  Physical Exam   In general this is a pleasant female in no distress comfortably lying in bed.  Her skin is warm and dry.  Oropharynx mucous membranes moist tongue is  midline I did not note any changes in her left lower mouth- compared to yesterday there is some tooth decay on a couple teeth but do not see any drainage bleeding or acute tenderness although it is somewhat tender she says when I touch the area.  Adenopathy.  Chest she does have some rhonchi which again clear largely with cough.  There is no labored breathing.  Heart is regular rate and rhythm.   Labs reviewed: Recent Labs    06/02/18 0356 06/03/18 0548 06/04/18 0501 06/07/18 07/06/18 09/06/18  NA 137 136 136 140 142 138  K 3.4* 3.7 3.6 4.3 4.4 4.3  CL 106 104 104  --   --   --   CO2 23 25 25   --   --   --   GLUCOSE 104* 137* 126*  --   --   --   BUN 12 13 17  25* 21 16  CREATININE 1.05* 1.09* 0.91 0.8 1.1 1.2*  CALCIUM 8.1* 8.5* 8.6*  --   --   --    Recent Labs    11/07/17 1212 04/25/18 0635 05/31/18 1401  AST 23 18 34  ALT 20 13 17   ALKPHOS 76 67 62  BILITOT 0.7 0.4 0.6  PROT 8.5* 9.0* 9.1*  ALBUMIN 3.3* 3.2* 3.4*   Recent Labs    06/01/18 0035 06/03/18 0548 06/04/18 0501 06/07/18 09/06/18 09/28/18  WBC 12.9* 13.5* 13.7* 16.9 14.5 13.6  NEUTROABS 9.6*  --   --  14 11 10   HGB 12.0 10.5* 11.3* 12.6 10.7* 10.7*  HCT 37.9 34.1* 35.8* 38 32* 32*  MCV 90.2 90.2 89.5  --   --   --   PLT 395 424* 415* 433* 422* 412*   Lab Results  Component Value Date   TSH 1.89 10/02/2018   Lab Results  Component Value Date   HGBA1C 5.9 09/06/2018   Lab Results  Component Value Date   CHOL 178 08/30/2012   HDL 49 08/30/2012   LDLCALC 92 08/30/2012   TRIG 187 (H) 08/30/2012   CHOLHDL 3.6 08/30/2012    Significant Diagnostic Results in last 30 days:  No results found.  Assessment/Plan  #1 toothache-this appears to be stable we have written orders to monitor for any increased pain or discomfort she appears to be comfortable today says tooth is not currently hurting she says this is intermittent-again will await dentistry inpu-- pain appears to be controlled but will  need continued monitoring   [phone]

## 2018-10-27 ENCOUNTER — Encounter: Payer: Self-pay | Admitting: Internal Medicine

## 2018-10-27 DIAGNOSIS — I872 Venous insufficiency (chronic) (peripheral): Secondary | ICD-10-CM | POA: Diagnosis not present

## 2018-10-27 DIAGNOSIS — Z9181 History of falling: Secondary | ICD-10-CM | POA: Diagnosis not present

## 2018-10-27 DIAGNOSIS — I10 Essential (primary) hypertension: Secondary | ICD-10-CM | POA: Diagnosis not present

## 2018-10-27 DIAGNOSIS — M6281 Muscle weakness (generalized): Secondary | ICD-10-CM | POA: Diagnosis not present

## 2018-10-27 DIAGNOSIS — Z8701 Personal history of pneumonia (recurrent): Secondary | ICD-10-CM | POA: Diagnosis not present

## 2018-10-27 DIAGNOSIS — R1311 Dysphagia, oral phase: Secondary | ICD-10-CM | POA: Diagnosis not present

## 2018-10-28 ENCOUNTER — Encounter: Payer: Self-pay | Admitting: Internal Medicine

## 2018-10-29 ENCOUNTER — Encounter: Payer: Self-pay | Admitting: Internal Medicine

## 2018-10-29 DIAGNOSIS — I10 Essential (primary) hypertension: Secondary | ICD-10-CM | POA: Diagnosis not present

## 2018-10-29 DIAGNOSIS — R1311 Dysphagia, oral phase: Secondary | ICD-10-CM | POA: Diagnosis not present

## 2018-10-29 DIAGNOSIS — Z8701 Personal history of pneumonia (recurrent): Secondary | ICD-10-CM | POA: Diagnosis not present

## 2018-10-29 DIAGNOSIS — I872 Venous insufficiency (chronic) (peripheral): Secondary | ICD-10-CM | POA: Diagnosis not present

## 2018-10-29 DIAGNOSIS — Z9181 History of falling: Secondary | ICD-10-CM | POA: Diagnosis not present

## 2018-10-29 DIAGNOSIS — M6281 Muscle weakness (generalized): Secondary | ICD-10-CM | POA: Diagnosis not present

## 2018-10-29 NOTE — Progress Notes (Signed)
Location:    [facility] Room Number: 218/B Place of Service:  SNF [phone]) Provider:  Beryle Quant, MD  Patient Care Team: [name], MD as PCP - General (Internal Medicine)  Extended Emergency Contact Information Primary Emergency Contact: Mauricia, [place] Home Phone: [phone] Relation: Sister Secondary Emergency Contact: Sandria Manly Mobile Phone: [phone] Relation: Niece  Code Status:  Full Code Goals of care: Advanced Directive information Advanced Directives 10/29/2018  Does Patient Have a Medical Advance Directive? Yes  Type of Advance Directive (No Data)  Does patient want to make changes to medical advance directive? No - Patient declined  Would patient like information on creating a medical advance directive? -  Pre-existing out of facility DNR order (yellow form or pink MOST form) -     Chief Complaint  Patient presents with  . Medical Management of Chronic Issues    Routine visit of medical management  . Best Practice Recommendations    T-Dap, Hep-C Screening, Colonoscopy, Dexa Scan, Mammogram    HPI:  Pt is a 68 y.o. female seen today for medical management of chronic diseases.     Past Medical History:  Diagnosis Date  . Allergic rhinitis   . Arthritis    "ankles, feet, knees" (10/30/2017)  . Asthma   . Bursitis   . Carpal tunnel syndrome   . GERD (gastroesophageal reflux disease)   . HTN (hypertension)   . Migraine    "a couple/yr" (10/30/2017)  . Obesity   . Peripheral vascular disease ([place])   . Pneumonia 1960s X 1  . Sciatica   . Tendinitis   . Type II diabetes mellitus ([facility])    Type II  . UTI (urinary tract infection)    Past Surgical History:  Procedure Laterality Date  . CRANIOTOMY N/A 01/19/2018   Procedure: ENDOSCOPIC TRANSPHENOIDAL RESECTION OF PITUITARY TUMOR;  Surgeon: Consuella Lose, MD;  Location: [facility];  Service: Neurosurgery;  Laterality: N/A;  ENDOSCOPIC  TRANSPHENOIDAL RESECTION OF PITUITARY TUMOR  . GANGLION CYST EXCISION Left   . KNEE ARTHROSCOPY Left 2003  . PLANTAR FASCIA RELEASE Right 1995   "heel"  . SINUS ENDO WITH FUSION N/A 01/19/2018   Procedure: SINUS ENDO WITH FUSION;  Surgeon: Jerrell Belfast, MD;  Location: [facility];  Service: ENT;  Laterality: N/A;  . TONSILLECTOMY  1958  . TRANSPHENOIDAL APPROACH EXPOSURE N/A 01/19/2018   Procedure: TRANSPHENOIDAL  RESECTION OF PITUITARY TUMOR;  Surgeon: Jerrell Belfast, MD;  Location: [facility];  Service: ENT;  Laterality: N/A;  . [place]; 1997   Dr [name]    Allergies  Allergen Reactions  . Penicillins Hives and Other (See Comments)    ++ got keflex in 2013 and 2019++ Has patient had a PCN reaction causing immediate rash, facial/tongue/throat swelling, SOB or lightheadedness with hypotension: No Has patient had a PCN reaction causing severe rash involving mucus membranes or skin necrosis: No PATIENT HAS HAD A PCN REACTION THAT REQUIRED HOSPITALIZATION:  #  #  YES  #  #  Has patient had a PCN reaction occurring within the last 10 years: No   . Codeine Nausea And Vomiting  . Indomethacin Diarrhea    Allergies as of 10/29/2018      Reactions   Penicillins Hives, Other (See Comments)   ++ got keflex in 2013 and 2019++ Has patient had a PCN reaction causing immediate rash, facial/tongue/throat swelling, SOB or lightheadedness with hypotension: No  Has patient had a PCN reaction causing severe rash involving mucus membranes or skin necrosis: No PATIENT HAS HAD A PCN REACTION THAT REQUIRED HOSPITALIZATION:  #  #  YES  #  #  Has patient had a PCN reaction occurring within the last 10 years: No   Codeine Nausea And Vomiting   Indomethacin Diarrhea      Medication List       Accurate as of October 29, 2018 10:12 AM. Always use your most recent med list.        acetaminophen 325 MG tablet Commonly known as:  TYLENOL Take 650 mg by mouth every 6 (six) hours as needed for  mild pain or moderate pain.   Advair Diskus 100-50 MCG/DOSE Aepb Generic drug:  Fluticasone-Salmeterol Inhale 1 puff into the lungs 2 (two) times daily.   albuterol 108 (90 Base) MCG/ACT inhaler Commonly known as:  PROVENTIL HFA;VENTOLIN HFA Inhale 2 puffs into the lungs every 6 (six) hours as needed for wheezing or shortness of breath.   amLODipine 10 MG tablet Commonly known as:  NORVASC Take 1 tablet (10 mg total) by mouth daily. PATIENT MUST BE SEEN FOR ADDITIONAL REFILLS   bisacodyl 10 MG suppository Commonly known as:  DULCOLAX Constipation (2 of 4): If not relieved by MOM, give 10 mg Bisacodyl suppositiory rectally X 1 dose in 24 hours as needed (Do not use constipation standing orders for residents with renal failure/CFR less than 30. Contact MD for orders)   esomeprazole 20 MG capsule Commonly known as:  NEXIUM Take 1 capsule (20 mg total) by mouth every morning. For acid reflex   furosemide 40 MG tablet Commonly known as:  LASIX TAKE ONE TABLET BY MOUTH EVERY DAY   levETIRAcetam 500 MG tablet Commonly known as:  KEPPRA Take 1 tablet (500 mg total) by mouth 2 (two) times daily.   lisinopril 40 MG tablet Commonly known as:  PRINIVIL,ZESTRIL Take 40 mg by mouth daily.   magnesium hydroxide 400 MG/5ML suspension Commonly known as:  MILK OF MAGNESIA Constipation (1 of 4): If no BM in 3 days, give 30 cc Milk of Magnesium p.o. x 1 dose in 24 hours as needed (Do not use standing constipation orders for residents with renal failure CFR less than 30. Contact MD for orders)   metFORMIN 500 MG tablet Commonly known as:  GLUCOPHAGE Take 500 mg by mouth 2 (two) times daily with a meal.   mometasone 50 MCG/ACT nasal spray Commonly known as:  NASONEX Place 1 spray into the nose as needed.   naproxen 500 MG tablet Commonly known as:  NAPROSYN Take 500 mg by mouth 2 (two) times daily as needed for moderate pain (Joint pain). Take after meals   NUTRITIONAL SUPPLEMENT PO Take  1 each by mouth daily. Magic Cup   REFRESH OPTIVE OP Apply to eye. Give 1 drop QID OU x 365 days   Vitamin D3 125 MCG (5000 UT) Caps GIVE 1 CAPSULE BY MOUTH ONCE DAILY       Review of Systems  Immunization History  Administered Date(s) Administered  . Influenza Split 03/08/2013  . Influenza Whole 09/03/2009, 06/03/2010  . Influenza,inj,Quad PF,6+ Mos 06/25/2014  . Influenza-Unspecified 06/08/2017  . Pneumococcal Polysaccharide-23 07/25/2002, 10/31/2017  . Td 07/25/2005   Pertinent  Health Maintenance Due  Topic Date Due  . MAMMOGRAM  11/24/2018 (Originally 01/14/2017)  . DEXA SCAN  11/24/2018 (Originally 09/02/2017)  . COLONOSCOPY  11/24/2018 (Originally 09/02/2002)  . PNA vac Low Risk Adult (  2 of 2 - PCV13) 11/01/2018  . HEMOGLOBIN A1C  12/05/2018  . INFLUENZA VACCINE  02/23/2019  . OPHTHALMOLOGY EXAM  09/18/2019  . FOOT EXAM  10/09/2019   Fall Risk  03/06/2018 10/20/2017 09/01/2017 08/15/2017 07/28/2017  Falls in the past year? Yes Yes Yes Yes Yes  Number falls in past yr: 1 2 or more 2 or more 1 2 or more  Comment - - 7 times  - -  Injury with Fall? Yes Yes No No Yes  Comment - Broken jaw - - -  Risk Factor Category  High Fall Risk High Fall Risk High Fall Risk High Fall Risk High Fall Risk  Risk for fall due to : Impaired balance/gait History of fall(s);Impaired mobility;Impaired balance/gait Impaired balance/gait;Impaired mobility Impaired balance/gait;Impaired mobility History of fall(s);Medication side effect;Impaired mobility  Follow up Falls prevention discussed - Falls prevention discussed Education provided Falls prevention discussed   Functional Status Survey:    Vitals:   10/29/18 0954  BP: 121/70  Pulse: 81  Resp: 18  Temp: (!) 97.4 F (36.3 C)  TempSrc: Oral  Weight: 176 lb 9.6 oz (80.1 kg)  Height: 5\' 1"  (1.549 m)   Body mass index is 33.37 kg/m. Physical Exam  Labs reviewed: Recent Labs    06/02/18 0356 06/03/18 0548 06/04/18 0501 06/07/18  07/06/18 09/06/18  NA 137 136 136 140 142 138  K 3.4* 3.7 3.6 4.3 4.4 4.3  CL 106 104 104  --   --   --   CO2 23 25 25   --   --   --   GLUCOSE 104* 137* 126*  --   --   --   BUN 12 13 17  25* 21 16  CREATININE 1.05* 1.09* 0.91 0.8 1.1 1.2*  CALCIUM 8.1* 8.5* 8.6*  --   --   --    Recent Labs    11/07/17 1212 04/25/18 0635 05/31/18 1401  AST 23 18 34  ALT 20 13 17   ALKPHOS 76 67 62  BILITOT 0.7 0.4 0.6  PROT 8.5* 9.0* 9.1*  ALBUMIN 3.3* 3.2* 3.4*   Recent Labs    06/01/18 0035 06/03/18 0548 06/04/18 0501 06/07/18 09/06/18 09/28/18  WBC 12.9* 13.5* 13.7* 16.9 14.5 13.6  NEUTROABS 9.6*  --   --  14 11 10   HGB 12.0 10.5* 11.3* 12.6 10.7* 10.7*  HCT 37.9 34.1* 35.8* 38 32* 32*  MCV 90.2 90.2 89.5  --   --   --   PLT 395 424* 415* 433* 422* 412*   Lab Results  Component Value Date   TSH 1.89 10/02/2018   Lab Results  Component Value Date   HGBA1C 5.9 09/06/2018   Lab Results  Component Value Date   CHOL 178 08/30/2012   HDL 49 08/30/2012   LDLCALC 92 08/30/2012   TRIG 187 (H) 08/30/2012   CHOLHDL 3.6 08/30/2012    Significant Diagnostic Results in last 30 days:  No results found.  Assessment/Plan There are no diagnoses linked to this encounter.   Family/ staff Communication:   Labs/tests ordered:      This encounter was created in error - please disregard.

## 2018-10-30 ENCOUNTER — Non-Acute Institutional Stay (SKILLED_NURSING_FACILITY): Payer: Medicare Other | Admitting: Adult Health

## 2018-10-30 ENCOUNTER — Encounter: Payer: Self-pay | Admitting: Adult Health

## 2018-10-30 DIAGNOSIS — J45909 Unspecified asthma, uncomplicated: Secondary | ICD-10-CM | POA: Diagnosis not present

## 2018-10-30 DIAGNOSIS — E119 Type 2 diabetes mellitus without complications: Secondary | ICD-10-CM

## 2018-10-30 DIAGNOSIS — I10 Essential (primary) hypertension: Secondary | ICD-10-CM | POA: Diagnosis not present

## 2018-10-30 DIAGNOSIS — K219 Gastro-esophageal reflux disease without esophagitis: Secondary | ICD-10-CM | POA: Diagnosis not present

## 2018-10-30 DIAGNOSIS — R600 Localized edema: Secondary | ICD-10-CM

## 2018-10-30 DIAGNOSIS — Z9181 History of falling: Secondary | ICD-10-CM | POA: Diagnosis not present

## 2018-10-30 DIAGNOSIS — R1311 Dysphagia, oral phase: Secondary | ICD-10-CM | POA: Diagnosis not present

## 2018-10-30 DIAGNOSIS — G40909 Epilepsy, unspecified, not intractable, without status epilepticus: Secondary | ICD-10-CM | POA: Diagnosis not present

## 2018-10-30 DIAGNOSIS — M6281 Muscle weakness (generalized): Secondary | ICD-10-CM | POA: Diagnosis not present

## 2018-10-30 DIAGNOSIS — Z8701 Personal history of pneumonia (recurrent): Secondary | ICD-10-CM | POA: Diagnosis not present

## 2018-10-30 DIAGNOSIS — I872 Venous insufficiency (chronic) (peripheral): Secondary | ICD-10-CM | POA: Diagnosis not present

## 2018-10-30 DIAGNOSIS — E559 Vitamin D deficiency, unspecified: Secondary | ICD-10-CM | POA: Diagnosis not present

## 2018-10-30 LAB — VITAMIN D 25 HYDROXY (VIT D DEFICIENCY, FRACTURES): Vit D, 25-Hydroxy: 16.78

## 2018-10-30 NOTE — Progress Notes (Signed)
Location:  [facility] Room Number: 218/B Place of Service:  SNF (31) Provider:  Durenda Age, NP  Patient Care Team: [name], MD as PCP - General (Internal Medicine)  Extended Emergency Contact Information Primary Emergency Contact: Sheryll, [place] Home Phone: [phone] Relation: Sister Secondary Emergency Contact: Sandria Manly Mobile Phone: [phone] Relation: Niece  Code Status:  Full Code  Goals of care: Advanced Directive information Advanced Directives 10/29/2018  Does Patient Have a Medical Advance Directive? Yes  Type of Advance Directive (No Data)  Does patient want to make changes to medical advance directive? No - Patient declined  Would patient like information on creating a medical advance directive? -  Pre-existing out of facility DNR order (yellow form or pink MOST form) -     Chief Complaint  Patient presents with  . Medical Management of Chronic Issues    Routine visit of medical management  . Best Practice Recommendations    T-Dap,Colonoscopy, Hep-C, Mammogram, Dexa Scan    HPI:  Pt is a 68 y.o. female seen today for medical management of chronic diseases. She has PMH seizure, diabetes mellitus, carpal tunnel syndrome and asthma.  She was seen today in her room.  CBGs are stable, 105, 95, 136, 130, 121.  BPs are 121/70, 148/82, 124/72, 130/74.   Past Medical History:  Diagnosis Date  . Allergic rhinitis   . Arthritis    "ankles, feet, knees" (10/30/2017)  . Asthma   . Bursitis   . Carpal tunnel syndrome   . GERD (gastroesophageal reflux disease)   . HTN (hypertension)   . Migraine    "a couple/yr" (10/30/2017)  . Obesity   . Peripheral vascular disease ([place])   . Pneumonia 1960s X 1  . Sciatica   . Tendinitis   . Type II diabetes mellitus (HCC)    Type II  . UTI (urinary tract infection)    Past Surgical History:  Procedure Laterality Date  . CRANIOTOMY N/A 01/19/2018   Procedure: ENDOSCOPIC  TRANSPHENOIDAL RESECTION OF PITUITARY TUMOR;  Surgeon: Consuella Lose, MD;  Location: [facility];  Service: Neurosurgery;  Laterality: N/A;  ENDOSCOPIC TRANSPHENOIDAL RESECTION OF PITUITARY TUMOR  . GANGLION CYST EXCISION Left   . KNEE ARTHROSCOPY Left 2003  . PLANTAR FASCIA RELEASE Right 1995   "heel"  . SINUS ENDO WITH FUSION N/A 01/19/2018   Procedure: SINUS ENDO WITH FUSION;  Surgeon: Jerrell Belfast, MD;  Location: [facility];  Service: ENT;  Laterality: N/A;  . TONSILLECTOMY  1958  . TRANSPHENOIDAL APPROACH EXPOSURE N/A 01/19/2018   Procedure: TRANSPHENOIDAL  RESECTION OF PITUITARY TUMOR;  Surgeon: Jerrell Belfast, MD;  Location: [facility];  Service: ENT;  Laterality: N/A;  . [place]; 1997   Dr [name]    Allergies  Allergen Reactions  . Penicillins Hives and Other (See Comments)    ++ got keflex in 2013 and 2019++ Has patient had a PCN reaction causing immediate rash, facial/tongue/throat swelling, SOB or lightheadedness with hypotension: No Has patient had a PCN reaction causing severe rash involving mucus membranes or skin necrosis: No PATIENT HAS HAD A PCN REACTION THAT REQUIRED HOSPITALIZATION:  #  #  YES  #  #  Has patient had a PCN reaction occurring within the last 10 years: No   . Codeine Nausea And Vomiting  . Indomethacin Diarrhea    Outpatient Encounter Medications as of 10/30/2018  Medication Sig  . acetaminophen (TYLENOL) 325 MG tablet Take 650 mg by mouth every  6 (six) hours as needed for mild pain or moderate pain.  [name] ADVAIR DISKUS 100-50 MCG/DOSE AEPB Inhale 1 puff into the lungs 2 (two) times daily.  [name] albuterol (PROVENTIL HFA;VENTOLIN HFA) 108 (90 Base) MCG/ACT inhaler Inhale 2 puffs into the lungs every 6 (six) hours as needed for wheezing or shortness of breath.  [name] amLODipine (NORVASC) 10 MG tablet Take 1 tablet (10 mg total) by mouth daily. PATIENT MUST BE SEEN FOR ADDITIONAL REFILLS  . bisacodyl (DULCOLAX) 10 MG suppository Constipation (2 of 4):  If not relieved by MOM, give 10 mg Bisacodyl suppositiory rectally X 1 dose in 24 hours as needed (Do not use constipation standing orders for residents with renal failure/CFR less than 30. Contact MD for orders)  . Carboxymethylcellul-Glycerin (REFRESH OPTIVE OP) Apply to eye. Give 1 drop QID OU x 365 days  . Cholecalciferol (VITAMIN D3) 125 MCG (5000 UT) CAPS GIVE 1 CAPSULE BY MOUTH ONCE DAILY  . esomeprazole (NEXIUM) 20 MG capsule Take 1 capsule (20 mg total) by mouth every morning. For acid reflex  . furosemide (LASIX) 40 MG tablet TAKE ONE TABLET BY MOUTH EVERY DAY  . levETIRAcetam (KEPPRA) 500 MG tablet Take 1 tablet (500 mg total) by mouth 2 (two) times daily.  [name] lisinopril (PRINIVIL,ZESTRIL) 40 MG tablet Take 40 mg by mouth daily.   . magnesium hydroxide (MILK OF MAGNESIA) 400 MG/5ML suspension Constipation (1 of 4): If no BM in 3 days, give 30 cc Milk of Magnesium p.o. x 1 dose in 24 hours as needed (Do not use standing constipation orders for residents with renal failure CFR less than 30. Contact MD for orders)  . metFORMIN (GLUCOPHAGE) 500 MG tablet Take 500 mg by mouth 2 (two) times daily with a meal.  . mometasone (NASONEX) 50 MCG/ACT nasal spray Place 1 spray into the nose as needed.  . naproxen (NAPROSYN) 500 MG tablet Take 500 mg by mouth 2 (two) times daily as needed for moderate pain (Joint pain). Take after meals  . Nutritional Supplements (NUTRITIONAL SUPPLEMENT PO) Take 1 each by mouth daily. Magic Cup   No facility-administered encounter medications on file as of 10/30/2018.     Review of Systems  GENERAL: No change in appetite, no fatigue, no weight changes, no fever, chills or weakness MOUTH and THROAT: Denies oral discomfort, gingival pain or bleeding, pain from teeth or hoarseness   RESPIRATORY: no cough, SOB, DOE, wheezing, hemoptysis CARDIAC: No chest pain, edema or palpitations GI: No abdominal pain, diarrhea, constipation, heart burn, nausea or vomiting GU: Denies  dysuria, frequency, hematuria, or discharge PSYCHIATRIC: Denies feelings of depression or anxiety. No report of hallucinations, insomnia, paranoia, or agitation    Immunization History  Administered Date(s) Administered  . Influenza Split 03/08/2013  . Influenza Whole 09/03/2009, 06/03/2010  . Influenza,inj,Quad PF,6+ Mos 06/25/2014  . Influenza-Unspecified 06/08/2017  . Pneumococcal Polysaccharide-23 07/25/2002, 10/31/2017  . Td 07/25/2005   Pertinent  Health Maintenance Due  Topic Date Due  . MAMMOGRAM  11/24/2018 (Originally 01/14/2017)  . DEXA SCAN  11/24/2018 (Originally 09/02/2017)  . COLONOSCOPY  11/24/2018 (Originally 09/02/2002)  . PNA vac Low Risk Adult (2 of 2 - PCV13) 11/01/2018  . HEMOGLOBIN A1C  12/05/2018  . INFLUENZA VACCINE  02/23/2019  . OPHTHALMOLOGY EXAM  09/18/2019  . FOOT EXAM  10/09/2019   Fall Risk  03/06/2018 10/20/2017 09/01/2017 08/15/2017 07/28/2017  Falls in the past year? Yes Yes Yes Yes Yes  Number falls in past yr: 1 2 or more  2 or more 1 2 or more  Comment - - 7 times  - -  Injury with Fall? Yes Yes No No Yes  Comment - Broken jaw - - -  Risk Factor Category  High Fall Risk High Fall Risk High Fall Risk High Fall Risk High Fall Risk  Risk for fall due to : Impaired balance/gait History of fall(s);Impaired mobility;Impaired balance/gait Impaired balance/gait;Impaired mobility Impaired balance/gait;Impaired mobility History of fall(s);Medication side effect;Impaired mobility  Follow up Falls prevention discussed - Falls prevention discussed Education provided Falls prevention discussed     Vitals:   10/30/18 1220  BP: 121/70  Pulse: 81  Resp: 18  Temp: (!) 97.4 F (36.3 C)  TempSrc: Oral  Weight: 176 lb 9.6 oz (80.1 kg)  Height: 5\' 1"  (1.549 m)   Body mass index is 33.37 kg/m.  Physical Exam  GENERAL APPEARANCE: Well nourished. In no acute distress. Obese SKIN:  Skin is warm and dry.  MOUTH and THROAT: Lips are without lesions. Oral mucosa is  moist and without lesions. Tongue is normal in shape, size, and color and without lesions RESPIRATORY: Breathing is even & unlabored, BS CTAB CARDIAC: RRR, no murmur,no extra heart sounds, no edema GI: Abdomen soft, normal BS, no masses, no tenderness, no hepatomegaly, no splenomegaly EXTREMITIES: Able to move X 4 extremities NEUROLOGICAL: There is no tremor. Speech is clear. Alert and oriented X 3. PSYCHIATRIC:  Affect and behavior are appropriate   Labs reviewed: Recent Labs    06/02/18 0356 06/03/18 0548 06/04/18 0501 06/07/18 07/06/18 09/06/18  NA 137 136 136 140 142 138  K 3.4* 3.7 3.6 4.3 4.4 4.3  CL 106 104 104  --   --   --   CO2 23 25 25   --   --   --   GLUCOSE 104* 137* 126*  --   --   --   BUN 12 13 17  25* 21 16  CREATININE 1.05* 1.09* 0.91 0.8 1.1 1.2*  CALCIUM 8.1* 8.5* 8.6*  --   --   --    Recent Labs    11/07/17 1212 04/25/18 0635 05/31/18 1401  AST 23 18 34  ALT 20 13 17   ALKPHOS 76 67 62  BILITOT 0.7 0.4 0.6  PROT 8.5* 9.0* 9.1*  ALBUMIN 3.3* 3.2* 3.4*   Recent Labs    06/01/18 0035 06/03/18 0548 06/04/18 0501 06/07/18 09/06/18 09/28/18  WBC 12.9* 13.5* 13.7* 16.9 14.5 13.6  NEUTROABS 9.6*  --   --  14 11 10   HGB 12.0 10.5* 11.3* 12.6 10.7* 10.7*  HCT 37.9 34.1* 35.8* 38 32* 32*  MCV 90.2 90.2 89.5  --   --   --   PLT 395 424* 415* 433* 422* 412*   Lab Results  Component Value Date   TSH 1.89 10/02/2018   Lab Results  Component Value Date   HGBA1C 5.9 09/06/2018   Lab Results  Component Value Date   CHOL 178 08/30/2012   HDL 49 08/30/2012   LDLCALC 92 08/30/2012   TRIG 187 (H) 08/30/2012   CHOLHDL 3.6 08/30/2012     Assessment/Plan  1. Type 2 diabetes mellitus treated without insulin (HCC) Lab Results  Component Value Date   HGBA1C 5.9 09/06/2018  - CBGs are well-controlled, continue metformin 500 mg 1 tab twice daily  2. Essential hypertension -BPs are well controlled, continue Norvasc 10 mg 1 tab daily and lisinopril 40 mg  1 tab daily  3. Moderate asthma without complication, unspecified  whether persistent -No wheezing, stable continue Advair 100-50 disK 1 puff into the lungs twice a day and Proventil as needed  4. Lower extremity edema -Stable, continue Lasix 40 mg 1 tab daily  5. Seizure disorder ([place]) -No reported recent seizures, continue Keppra 500 mg 1 tab twice a day   Family/ staff Communication: Discussed plan of care with resident.  Labs/tests ordered: None  Goals of care:   Long-term care   Durenda Age, NP [facility] [phone] (Monday-Friday 8:00 a.m. - 5:00 p.m.) [phone] (after hours)

## 2018-10-31 DIAGNOSIS — Z9181 History of falling: Secondary | ICD-10-CM | POA: Diagnosis not present

## 2018-10-31 DIAGNOSIS — Z8701 Personal history of pneumonia (recurrent): Secondary | ICD-10-CM | POA: Diagnosis not present

## 2018-10-31 DIAGNOSIS — I10 Essential (primary) hypertension: Secondary | ICD-10-CM | POA: Diagnosis not present

## 2018-10-31 DIAGNOSIS — I872 Venous insufficiency (chronic) (peripheral): Secondary | ICD-10-CM | POA: Diagnosis not present

## 2018-10-31 DIAGNOSIS — R1311 Dysphagia, oral phase: Secondary | ICD-10-CM | POA: Diagnosis not present

## 2018-10-31 DIAGNOSIS — M6281 Muscle weakness (generalized): Secondary | ICD-10-CM | POA: Diagnosis not present

## 2018-11-01 DIAGNOSIS — Z8701 Personal history of pneumonia (recurrent): Secondary | ICD-10-CM | POA: Diagnosis not present

## 2018-11-01 DIAGNOSIS — M6281 Muscle weakness (generalized): Secondary | ICD-10-CM | POA: Diagnosis not present

## 2018-11-01 DIAGNOSIS — R1311 Dysphagia, oral phase: Secondary | ICD-10-CM | POA: Diagnosis not present

## 2018-11-01 DIAGNOSIS — I872 Venous insufficiency (chronic) (peripheral): Secondary | ICD-10-CM | POA: Diagnosis not present

## 2018-11-01 DIAGNOSIS — Z9181 History of falling: Secondary | ICD-10-CM | POA: Diagnosis not present

## 2018-11-01 DIAGNOSIS — I10 Essential (primary) hypertension: Secondary | ICD-10-CM | POA: Diagnosis not present

## 2018-11-02 DIAGNOSIS — Z8701 Personal history of pneumonia (recurrent): Secondary | ICD-10-CM | POA: Diagnosis not present

## 2018-11-02 DIAGNOSIS — I872 Venous insufficiency (chronic) (peripheral): Secondary | ICD-10-CM | POA: Diagnosis not present

## 2018-11-02 DIAGNOSIS — M6281 Muscle weakness (generalized): Secondary | ICD-10-CM | POA: Diagnosis not present

## 2018-11-02 DIAGNOSIS — Z9181 History of falling: Secondary | ICD-10-CM | POA: Diagnosis not present

## 2018-11-02 DIAGNOSIS — I10 Essential (primary) hypertension: Secondary | ICD-10-CM | POA: Diagnosis not present

## 2018-11-02 DIAGNOSIS — R1311 Dysphagia, oral phase: Secondary | ICD-10-CM | POA: Diagnosis not present

## 2018-11-05 DIAGNOSIS — I872 Venous insufficiency (chronic) (peripheral): Secondary | ICD-10-CM | POA: Diagnosis not present

## 2018-11-05 DIAGNOSIS — Z9181 History of falling: Secondary | ICD-10-CM | POA: Diagnosis not present

## 2018-11-05 DIAGNOSIS — I10 Essential (primary) hypertension: Secondary | ICD-10-CM | POA: Diagnosis not present

## 2018-11-05 DIAGNOSIS — Z8701 Personal history of pneumonia (recurrent): Secondary | ICD-10-CM | POA: Diagnosis not present

## 2018-11-05 DIAGNOSIS — M6281 Muscle weakness (generalized): Secondary | ICD-10-CM | POA: Diagnosis not present

## 2018-11-05 DIAGNOSIS — R1311 Dysphagia, oral phase: Secondary | ICD-10-CM | POA: Diagnosis not present

## 2018-11-06 DIAGNOSIS — R1311 Dysphagia, oral phase: Secondary | ICD-10-CM | POA: Diagnosis not present

## 2018-11-06 DIAGNOSIS — I10 Essential (primary) hypertension: Secondary | ICD-10-CM | POA: Diagnosis not present

## 2018-11-06 DIAGNOSIS — I872 Venous insufficiency (chronic) (peripheral): Secondary | ICD-10-CM | POA: Diagnosis not present

## 2018-11-06 DIAGNOSIS — Z8701 Personal history of pneumonia (recurrent): Secondary | ICD-10-CM | POA: Diagnosis not present

## 2018-11-06 DIAGNOSIS — Z9181 History of falling: Secondary | ICD-10-CM | POA: Diagnosis not present

## 2018-11-06 DIAGNOSIS — M6281 Muscle weakness (generalized): Secondary | ICD-10-CM | POA: Diagnosis not present

## 2018-11-07 DIAGNOSIS — Z8701 Personal history of pneumonia (recurrent): Secondary | ICD-10-CM | POA: Diagnosis not present

## 2018-11-07 DIAGNOSIS — I872 Venous insufficiency (chronic) (peripheral): Secondary | ICD-10-CM | POA: Diagnosis not present

## 2018-11-07 DIAGNOSIS — I10 Essential (primary) hypertension: Secondary | ICD-10-CM | POA: Diagnosis not present

## 2018-11-07 DIAGNOSIS — Z9181 History of falling: Secondary | ICD-10-CM | POA: Diagnosis not present

## 2018-11-07 DIAGNOSIS — M6281 Muscle weakness (generalized): Secondary | ICD-10-CM | POA: Diagnosis not present

## 2018-11-07 DIAGNOSIS — R1311 Dysphagia, oral phase: Secondary | ICD-10-CM | POA: Diagnosis not present

## 2018-11-08 DIAGNOSIS — M6281 Muscle weakness (generalized): Secondary | ICD-10-CM | POA: Diagnosis not present

## 2018-11-08 DIAGNOSIS — Z9181 History of falling: Secondary | ICD-10-CM | POA: Diagnosis not present

## 2018-11-08 DIAGNOSIS — I872 Venous insufficiency (chronic) (peripheral): Secondary | ICD-10-CM | POA: Diagnosis not present

## 2018-11-08 DIAGNOSIS — I10 Essential (primary) hypertension: Secondary | ICD-10-CM | POA: Diagnosis not present

## 2018-11-08 DIAGNOSIS — R1311 Dysphagia, oral phase: Secondary | ICD-10-CM | POA: Diagnosis not present

## 2018-11-08 DIAGNOSIS — Z8701 Personal history of pneumonia (recurrent): Secondary | ICD-10-CM | POA: Diagnosis not present

## 2018-11-09 DIAGNOSIS — I10 Essential (primary) hypertension: Secondary | ICD-10-CM | POA: Diagnosis not present

## 2018-11-09 DIAGNOSIS — M6281 Muscle weakness (generalized): Secondary | ICD-10-CM | POA: Diagnosis not present

## 2018-11-09 DIAGNOSIS — Z9181 History of falling: Secondary | ICD-10-CM | POA: Diagnosis not present

## 2018-11-09 DIAGNOSIS — I872 Venous insufficiency (chronic) (peripheral): Secondary | ICD-10-CM | POA: Diagnosis not present

## 2018-11-09 DIAGNOSIS — R1311 Dysphagia, oral phase: Secondary | ICD-10-CM | POA: Diagnosis not present

## 2018-11-09 DIAGNOSIS — Z8701 Personal history of pneumonia (recurrent): Secondary | ICD-10-CM | POA: Diagnosis not present

## 2018-11-10 DIAGNOSIS — Z8701 Personal history of pneumonia (recurrent): Secondary | ICD-10-CM | POA: Diagnosis not present

## 2018-11-10 DIAGNOSIS — I872 Venous insufficiency (chronic) (peripheral): Secondary | ICD-10-CM | POA: Diagnosis not present

## 2018-11-10 DIAGNOSIS — R1311 Dysphagia, oral phase: Secondary | ICD-10-CM | POA: Diagnosis not present

## 2018-11-10 DIAGNOSIS — I10 Essential (primary) hypertension: Secondary | ICD-10-CM | POA: Diagnosis not present

## 2018-11-10 DIAGNOSIS — Z9181 History of falling: Secondary | ICD-10-CM | POA: Diagnosis not present

## 2018-11-10 DIAGNOSIS — M6281 Muscle weakness (generalized): Secondary | ICD-10-CM | POA: Diagnosis not present

## 2018-11-12 DIAGNOSIS — Z8701 Personal history of pneumonia (recurrent): Secondary | ICD-10-CM | POA: Diagnosis not present

## 2018-11-12 DIAGNOSIS — R1311 Dysphagia, oral phase: Secondary | ICD-10-CM | POA: Diagnosis not present

## 2018-11-12 DIAGNOSIS — I872 Venous insufficiency (chronic) (peripheral): Secondary | ICD-10-CM | POA: Diagnosis not present

## 2018-11-12 DIAGNOSIS — M6281 Muscle weakness (generalized): Secondary | ICD-10-CM | POA: Diagnosis not present

## 2018-11-12 DIAGNOSIS — Z9181 History of falling: Secondary | ICD-10-CM | POA: Diagnosis not present

## 2018-11-12 DIAGNOSIS — I10 Essential (primary) hypertension: Secondary | ICD-10-CM | POA: Diagnosis not present

## 2018-11-13 ENCOUNTER — Telehealth: Payer: Self-pay | Admitting: Obstetrics and Gynecology

## 2018-11-13 DIAGNOSIS — I872 Venous insufficiency (chronic) (peripheral): Secondary | ICD-10-CM | POA: Diagnosis not present

## 2018-11-13 DIAGNOSIS — R1311 Dysphagia, oral phase: Secondary | ICD-10-CM | POA: Diagnosis not present

## 2018-11-13 DIAGNOSIS — Z9181 History of falling: Secondary | ICD-10-CM | POA: Diagnosis not present

## 2018-11-13 DIAGNOSIS — Z8701 Personal history of pneumonia (recurrent): Secondary | ICD-10-CM | POA: Diagnosis not present

## 2018-11-13 DIAGNOSIS — I10 Essential (primary) hypertension: Secondary | ICD-10-CM | POA: Diagnosis not present

## 2018-11-13 DIAGNOSIS — M6281 Muscle weakness (generalized): Secondary | ICD-10-CM | POA: Diagnosis not present

## 2018-11-13 NOTE — Telephone Encounter (Signed)
Mariah [name] with [facility] called to reschedule the patient appointment. Informed of the virtual visit, she  Stated due to the [place] restrictions there is not enough staff at the location to assist the patient. She would like the appointment pushed back as far as she can have it. Informed the administrator of the appointment being placed on the wait list to be scheduled after May.

## 2018-11-14 ENCOUNTER — Ambulatory Visit: Payer: Medicare Other | Admitting: Obstetrics & Gynecology

## 2018-11-14 DIAGNOSIS — Z8701 Personal history of pneumonia (recurrent): Secondary | ICD-10-CM | POA: Diagnosis not present

## 2018-11-14 DIAGNOSIS — I10 Essential (primary) hypertension: Secondary | ICD-10-CM | POA: Diagnosis not present

## 2018-11-14 DIAGNOSIS — I872 Venous insufficiency (chronic) (peripheral): Secondary | ICD-10-CM | POA: Diagnosis not present

## 2018-11-14 DIAGNOSIS — R1311 Dysphagia, oral phase: Secondary | ICD-10-CM | POA: Diagnosis not present

## 2018-11-14 DIAGNOSIS — M6281 Muscle weakness (generalized): Secondary | ICD-10-CM | POA: Diagnosis not present

## 2018-11-14 DIAGNOSIS — Z9181 History of falling: Secondary | ICD-10-CM | POA: Diagnosis not present

## 2018-11-15 DIAGNOSIS — R1311 Dysphagia, oral phase: Secondary | ICD-10-CM | POA: Diagnosis not present

## 2018-11-15 DIAGNOSIS — I10 Essential (primary) hypertension: Secondary | ICD-10-CM | POA: Diagnosis not present

## 2018-11-15 DIAGNOSIS — M6281 Muscle weakness (generalized): Secondary | ICD-10-CM | POA: Diagnosis not present

## 2018-11-15 DIAGNOSIS — I872 Venous insufficiency (chronic) (peripheral): Secondary | ICD-10-CM | POA: Diagnosis not present

## 2018-11-15 DIAGNOSIS — Z9181 History of falling: Secondary | ICD-10-CM | POA: Diagnosis not present

## 2018-11-15 DIAGNOSIS — Z8701 Personal history of pneumonia (recurrent): Secondary | ICD-10-CM | POA: Diagnosis not present

## 2018-11-16 DIAGNOSIS — I10 Essential (primary) hypertension: Secondary | ICD-10-CM | POA: Diagnosis not present

## 2018-11-16 DIAGNOSIS — Z9181 History of falling: Secondary | ICD-10-CM | POA: Diagnosis not present

## 2018-11-16 DIAGNOSIS — Z8701 Personal history of pneumonia (recurrent): Secondary | ICD-10-CM | POA: Diagnosis not present

## 2018-11-16 DIAGNOSIS — M6281 Muscle weakness (generalized): Secondary | ICD-10-CM | POA: Diagnosis not present

## 2018-11-16 DIAGNOSIS — I872 Venous insufficiency (chronic) (peripheral): Secondary | ICD-10-CM | POA: Diagnosis not present

## 2018-11-16 DIAGNOSIS — R1311 Dysphagia, oral phase: Secondary | ICD-10-CM | POA: Diagnosis not present

## 2018-11-19 ENCOUNTER — Non-Acute Institutional Stay (SKILLED_NURSING_FACILITY): Payer: Medicare Other | Admitting: Internal Medicine

## 2018-11-19 ENCOUNTER — Encounter: Payer: Self-pay | Admitting: Internal Medicine

## 2018-11-19 DIAGNOSIS — D497 Neoplasm of unspecified behavior of endocrine glands and other parts of nervous system: Secondary | ICD-10-CM | POA: Diagnosis not present

## 2018-11-19 DIAGNOSIS — D649 Anemia, unspecified: Secondary | ICD-10-CM

## 2018-11-19 DIAGNOSIS — R1311 Dysphagia, oral phase: Secondary | ICD-10-CM | POA: Diagnosis not present

## 2018-11-19 DIAGNOSIS — J45909 Unspecified asthma, uncomplicated: Secondary | ICD-10-CM | POA: Diagnosis not present

## 2018-11-19 DIAGNOSIS — I10 Essential (primary) hypertension: Secondary | ICD-10-CM | POA: Diagnosis not present

## 2018-11-19 DIAGNOSIS — E119 Type 2 diabetes mellitus without complications: Secondary | ICD-10-CM

## 2018-11-19 DIAGNOSIS — Z8701 Personal history of pneumonia (recurrent): Secondary | ICD-10-CM | POA: Diagnosis not present

## 2018-11-19 DIAGNOSIS — I872 Venous insufficiency (chronic) (peripheral): Secondary | ICD-10-CM

## 2018-11-19 DIAGNOSIS — Z9181 History of falling: Secondary | ICD-10-CM | POA: Diagnosis not present

## 2018-11-19 DIAGNOSIS — N939 Abnormal uterine and vaginal bleeding, unspecified: Secondary | ICD-10-CM

## 2018-11-19 DIAGNOSIS — M6281 Muscle weakness (generalized): Secondary | ICD-10-CM | POA: Diagnosis not present

## 2018-11-19 NOTE — Progress Notes (Signed)
Location:    [facility] Room Number: 218/B Place of Service:  SNF [phone]) Provider: Beryle Quant, MD  Patient Care Team: [name], MD as PCP - General (Internal Medicine)  Extended Emergency Contact Information Primary Emergency Contact: Lataya, Varnell Home Phone: [phone] Relation: Sister Secondary Emergency Contact: Sandria Manly Mobile Phone: [phone] Relation: Niece  Code Status:  Full Code Goals of care: Advanced Directive information Advanced Directives 11/19/2018  Does Patient Have a Medical Advance Directive? Yes  Type of Advance Directive (No Data)  Does patient want to make changes to medical advance directive? No - Patient declined  Would patient like information on creating a medical advance directive? -  Pre-existing out of facility DNR order (yellow form or pink MOST form) -     Chief Complaint  Patient presents with  . Medical Management of Chronic Issues    Routine visit of medical management  Medical management of chronic medical conditions including type 2 diabetes-- asthma-seizure disorder- hypertension-  HPI:  Pt is a 68 y.o. female seen today for medical management of chronic diseases.  As noted above.  Nursing does not report any issues.  She has no acute complaints but does continue to complain of some lower left tooth discomfort  At times-again obtaining a dental consult has been somewhat problematic secondary to the coronavirus issues.  She also says she always occasionally has a headache that is transitory she says she has been having this for a few weeks-I do note she does not have anything of it appears for pain management and will order Tylenol may also help with her tooth pain which she has occasionally  She also has a history in the past of post menopausal bleeding-she does have a GYN consult but this is been delayed it appears because the coronavirus issues  She also  has a history of a thyroid nodule FNA can be pursued once the CT scan suggesting residual pituitary tumor and postmenopausal bleeding issues have been evaluated  For evaluation of the bleeding is been difficult because of patient's neurocognitive deficits-she cannot really define how often and to what extent she has had vaginal bleeding  In regards to the pituitary tumor she does have abnormal CT scan results followed by Dr. Cruzita Lederer and Kathyrn Sheriff  She also has a history of asthma and continues on Advair as well as as needed albuterol she is not complaining of any increased shortness of breath or cough she also has a history of type 2 diabetes on Glucophage 500 mg twice daily-blood sugars appear to run from the 90s to lower 100s generally hemoglobin A1c was 5.9 in February  Regards to hypertension she is on Lasix 40 mg a day Norvasc 10 mg a day and lisinopril 40 mg a day- blood pressure systolically appear to run from 118 up to the 140s- I do not see consistent elevations above this.  She is also on Lasix with a history of what appears to be venous stasis weight has been stable at around 176 pounds   Past Medical History:  Diagnosis Date  . Allergic rhinitis   . Arthritis    "ankles, feet, knees" (10/30/2017)  . Asthma   . Bursitis   . Carpal tunnel syndrome   . GERD (gastroesophageal reflux disease)   . HTN (hypertension)   . Migraine    "a couple/yr" (10/30/2017)  . Obesity   . Peripheral vascular disease ([place])   .  Pneumonia 1960s X 1  . Sciatica   . Tendinitis   . Type II diabetes mellitus (HCC)    Type II  . UTI (urinary tract infection)    Past Surgical History:  Procedure Laterality Date  . CRANIOTOMY N/A 01/19/2018   Procedure: ENDOSCOPIC TRANSPHENOIDAL RESECTION OF PITUITARY TUMOR;  Surgeon: Consuella Lose, MD;  Location: [facility];  Service: Neurosurgery;  Laterality: N/A;  ENDOSCOPIC TRANSPHENOIDAL RESECTION OF PITUITARY TUMOR  . GANGLION CYST EXCISION Left   . KNEE  ARTHROSCOPY Left 2003  . PLANTAR FASCIA RELEASE Right 1995   "heel"  . SINUS ENDO WITH FUSION N/A 01/19/2018   Procedure: SINUS ENDO WITH FUSION;  Surgeon: Jerrell Belfast, MD;  Location: [facility];  Service: ENT;  Laterality: N/A;  . TONSILLECTOMY  1958  . TRANSPHENOIDAL APPROACH EXPOSURE N/A 01/19/2018   Procedure: TRANSPHENOIDAL  RESECTION OF PITUITARY TUMOR;  Surgeon: Jerrell Belfast, MD;  Location: [facility];  Service: ENT;  Laterality: N/A;  . [place]; 1997   Dr [name]    Allergies  Allergen Reactions  . Penicillins Hives and Other (See Comments)    ++ got keflex in 2013 and 2019++ Has patient had a PCN reaction causing immediate rash, facial/tongue/throat swelling, SOB or lightheadedness with hypotension: No Has patient had a PCN reaction causing severe rash involving mucus membranes or skin necrosis: No PATIENT HAS HAD A PCN REACTION THAT REQUIRED HOSPITALIZATION:  #  #  YES  #  #  Has patient had a PCN reaction occurring within the last 10 years: No   . Codeine Nausea And Vomiting  . Indomethacin Diarrhea    Allergies as of 11/19/2018      Reactions   Penicillins Hives, Other (See Comments)   ++ got keflex in 2013 and 2019++ Has patient had a PCN reaction causing immediate rash, facial/tongue/throat swelling, SOB or lightheadedness with hypotension: No Has patient had a PCN reaction causing severe rash involving mucus membranes or skin necrosis: No PATIENT HAS HAD A PCN REACTION THAT REQUIRED HOSPITALIZATION:  #  #  YES  #  #  Has patient had a PCN reaction occurring within the last 10 years: No   Codeine Nausea And Vomiting   Indomethacin Diarrhea      Medication List       Accurate as of November 19, 2018  3:47 PM. Always use your most recent med list.        acetaminophen 325 MG tablet Commonly known as:  TYLENOL Take 650 mg by mouth every 6 (six) hours as needed for mild pain or moderate pain.   Advair Diskus 100-50 MCG/DOSE Aepb Generic drug:   Fluticasone-Salmeterol Inhale 1 puff into the lungs 2 (two) times daily.   albuterol 108 (90 Base) MCG/ACT inhaler Commonly known as:  VENTOLIN HFA Inhale 2 puffs into the lungs every 6 (six) hours as needed for wheezing or shortness of breath.   AMLODIPINE BESYLATE PO 10 mg. TAKE 1 TABLET BY MOUTH ONCE DAILY FOR HTN   bisacodyl 10 MG suppository Commonly known as:  DULCOLAX Constipation (2 of 4): If not relieved by MOM, give 10 mg Bisacodyl suppositiory rectally X 1 dose in 24 hours as needed (Do not use constipation standing orders for residents with renal failure/CFR less than 30. Contact MD for orders)   esomeprazole 20 MG capsule Commonly known as:  NEXIUM Take 1 capsule (20 mg total) by mouth every morning. For acid reflex   furosemide 40 MG tablet Commonly  known as:  LASIX TAKE ONE TABLET BY MOUTH EVERY DAY   levETIRAcetam 500 MG tablet Commonly known as:  KEPPRA Take 1 tablet (500 mg total) by mouth 2 (two) times daily.   lisinopril 40 MG tablet Commonly known as:  ZESTRIL Take 40 mg by mouth daily.   magnesium hydroxide 400 MG/5ML suspension Commonly known as:  MILK OF MAGNESIA Constipation (1 of 4): If no BM in 3 days, give 30 cc Milk of Magnesium p.o. x 1 dose in 24 hours as needed (Do not use standing constipation orders for residents with renal failure CFR less than 30. Contact MD for orders)   metFORMIN 500 MG tablet Commonly known as:  GLUCOPHAGE Take 500 mg by mouth 2 (two) times daily with a meal.   mometasone 50 MCG/ACT nasal spray Commonly known as:  NASONEX Place 1 spray into the nose as needed.   naproxen 500 MG tablet Commonly known as:  NAPROSYN Take 500 mg by mouth 2 (two) times daily as needed for moderate pain (Joint pain). Take after meals   NUTRITIONAL SUPPLEMENT PO Take 1 each by mouth daily. Magic Cup   Vitamin D3 125 MCG (5000 UT) Caps GIVE 1 CAPSULE BY MOUTH ONCE DAILY       Review of Systems   This is limited secondary to  dementia.  General she is not complaining of fever chills.  Skin does not complain of rashes or itching  Head ears eyes nose mouth and throat is not complain of visual changes or sore throat.  She does complain of intermittent left lower tooth pain   Respiratory does not complain of shortness of breath at times has a cough but she says this is not changed from baseline she does have a history of asthma  Cardiac is not complaining of chest pain has chronic venous stasis edema.  GI is not complaining of abdominal pain nausea vomiting diarrhea constipation  GU is not complaining of dysuria.  Musculoskeletal has weakness especially lower extremities but does not complain of joint pain.  She does have an order for naproxen twice daily as needed for joint pain  Neurologic complains of an occasional headache does not complain of visual changes or numbness does have neurocognitive disorder and some weakness does not describe syncope  Psych again positive for some neurocognitive deficits does not complain of being depressed or anxious  Immunization History  Administered Date(s) Administered  . Influenza Split 03/08/2013  . Influenza Whole 09/03/2009, 06/03/2010  . Influenza,inj,Quad PF,6+ Mos 06/25/2014  . Influenza-Unspecified 06/08/2017  . Pneumococcal Polysaccharide-23 07/25/2002, 10/31/2017  . Td 07/25/2005   Pertinent  Health Maintenance Due  Topic Date Due  . PNA vac Low Risk Adult (2 of 2 - PCV13) 11/01/2018  . MAMMOGRAM  11/24/2018 (Originally 01/14/2017)  . DEXA SCAN  11/24/2018 (Originally 09/02/2017)  . COLONOSCOPY  11/24/2018 (Originally 09/02/2002)  . HEMOGLOBIN A1C  12/05/2018  . INFLUENZA VACCINE  02/23/2019  . OPHTHALMOLOGY EXAM  09/18/2019  . FOOT EXAM  10/09/2019   Fall Risk  03/06/2018 10/20/2017 09/01/2017 08/15/2017 07/28/2017  Falls in the past year? Yes Yes Yes Yes Yes  Number falls in past yr: 1 2 or more 2 or more 1 2 or more  Comment - - 7 times  - -  Injury with  Fall? Yes Yes No No Yes  Comment - Broken jaw - - -  Risk Factor Category  High Fall Risk High Fall Risk High Fall Risk High Fall Risk High Fall  Risk  Risk for fall due to : Impaired balance/gait History of fall(s);Impaired mobility;Impaired balance/gait Impaired balance/gait;Impaired mobility Impaired balance/gait;Impaired mobility History of fall(s);Medication side effect;Impaired mobility  Follow up Falls prevention discussed - Falls prevention discussed Education provided Falls prevention discussed   Functional Status Survey:    Vitals:   11/19/18 1546  BP: 130/76  Pulse: 76  Resp: 20  Temp: 98 F (36.7 C)  TempSrc: Oral  Weight: 176 lb 9.6 oz (80.1 kg)  Height: 5\' 1"  (1.549 m)   Body mass index is 33.37 kg/m. Physical Exam   In general this is a pleasant elderly female distress wheelchair.  Skin is warm and dry.  She does have hyperpigmented changes to her shins bilaterally    Eyes visual acuity appears to be intact sclera and conjunctive are clear pupils appear to be reactive to light  Oropharynx is clear mucous membranes moist. She does have tooth decay most prominently left lower mouth-I do not see drainage or bleeding --or tenderness to palpation of the jaw area  Could not appreciate adenopathy  Chest she does have some baseline diffuse rhonchi there is no labored breathing.  Heart is regular rate and rhythm without murmur gallop or rub she has what appears to be venous stasis edema in lower extremities.  With hyper pigmentary changes as noted above.  Abdomen is soft nontender with positive bowel sounds.  Musculoskeletal does ambulate in a wheelchair appears moves her extremities at baseline with baseline lower extremity weakness.  Neurologic is grossly intact her speech is clear no lateralizing findings grip strength appears to be equal bilaterally she is able to shrug her shoulders with equal strength bilaterally  Moves her extremities bilaterally-.  Cranial  nerves appear to be intact speech is clear   Psych she is pleasant is able to carry on a fairly straightforward conversation she does have some moderate neurocognitive deficits as noted previously with some confusion     Labs reviewed: Recent Labs    06/02/18 0356 06/03/18 0548 06/04/18 0501 06/07/18 07/06/18 09/06/18  NA 137 136 136 140 142 138  K 3.4* 3.7 3.6 4.3 4.4 4.3  CL 106 104 104  --   --   --   CO2 23 25 25   --   --   --   GLUCOSE 104* 137* 126*  --   --   --   BUN 12 13 17  25* 21 16  CREATININE 1.05* 1.09* 0.91 0.8 1.1 1.2*  CALCIUM 8.1* 8.5* 8.6*  --   --   --    Recent Labs    04/25/18 0635 05/31/18 1401  AST 18 34  ALT 13 17  ALKPHOS 67 62  BILITOT 0.4 0.6  PROT 9.0* 9.1*  ALBUMIN 3.2* 3.4*   Recent Labs    06/01/18 0035 06/03/18 0548 06/04/18 0501 06/07/18 09/06/18 09/28/18  WBC 12.9* 13.5* 13.7* 16.9 14.5 13.6  NEUTROABS 9.6*  --   --  14 11 10   HGB 12.0 10.5* 11.3* 12.6 10.7* 10.7*  HCT 37.9 34.1* 35.8* 38 32* 32*  MCV 90.2 90.2 89.5  --   --   --   PLT 395 424* 415* 433* 422* 412*   Lab Results  Component Value Date   TSH 1.89 10/02/2018   Lab Results  Component Value Date   HGBA1C 5.9 09/06/2018   Lab Results  Component Value Date   CHOL 178 08/30/2012   HDL 49 08/30/2012   LDLCALC 92 08/30/2012   TRIG 187 (  H) 08/30/2012   CHOLHDL 3.6 08/30/2012    Significant Diagnostic Results in last 30 days:  No results found.  Assessment/Plan  #1 type 2 diabetes appears fairly stable on Glucophage 500 mg twice daily hemoglobin A1c was 5.9 back in February blood sugars appear to run from the 90s to lower 100s.  2.  Hypertension this appears stable with some variable systolics as noted above she is on Norvasc 10 mg a day lisinopril 40 mg a day and Lasix 40 mg a day.  3.  Lower extremity edema this appears stable her weight is stable edema appears relatively baseline she is on 40 mg of Lasix a day.  4.  History of seizure disorder this is  been stable for some time on Keppra she gets 500 mg twice a day.  5.  History of asthma this is been stable on Advair  As well as albuterol  inhalers needed- she does not complain of increased cough from baseline or shortness of breath  #6 history of vaginal bleeding again somewhat of a spotty history here--GYN consult is pending again this has been delayed because of the coronavirus issue  7.  History of pituitary tumor -abnormal CT scan results will be followed by Dr.Gherge and Nundkumar--clinically she appears to be at baseline  #8- history of thyroid nodule again further work-up with FNA can be pursued once her residual pituitary tumor and postmenopausal bleeding issues have been evaluated.  Again I suspect the coronavirus issue has complicated follow-up in the short-term.   #9 History of anemia hemoglobin has dropped gradually down to 10.7- but is stabilized per February of March labs.  We will monitor periodically again obtaining update labs has been challenging because the coronavirus and trying to limit visitors to facility--at this point clinically stable and once restrictions ease will try to update labs  10 history of toothache- again will continue to monitor this appears intermittent again dental consult is pending but has been complicated by coronavirus-will add Tylenol for pain and consider Orajel as well.  11.  History of headache this appears to be intermittent at this point will monitor and hopefully Tylenol will help --neurologically she appears to be at baseline but will need to be monitored.  #12 history of joint pain she does have a naproxen order twice daily as needed this appears to be effective  13  History of vitamin D deficiency she is on supplementation vitamin D level appears to be slowly rising will try to recheck this when we do updated labs again we are trying to limit labs because the coronavirus.  CPT- [id number]-of note greater than 35 minutes spent assessing  patient- reviewing her chart and labs- coordinating and formulating a plan of care for numerous diagnoses of note greater than 50% of time spent coordinating a plan of care with input as noted above

## 2018-11-20 DIAGNOSIS — I10 Essential (primary) hypertension: Secondary | ICD-10-CM | POA: Diagnosis not present

## 2018-11-20 DIAGNOSIS — Z8701 Personal history of pneumonia (recurrent): Secondary | ICD-10-CM | POA: Diagnosis not present

## 2018-11-20 DIAGNOSIS — I872 Venous insufficiency (chronic) (peripheral): Secondary | ICD-10-CM | POA: Diagnosis not present

## 2018-11-20 DIAGNOSIS — M6281 Muscle weakness (generalized): Secondary | ICD-10-CM | POA: Diagnosis not present

## 2018-11-20 DIAGNOSIS — Z9181 History of falling: Secondary | ICD-10-CM | POA: Diagnosis not present

## 2018-11-20 DIAGNOSIS — R1311 Dysphagia, oral phase: Secondary | ICD-10-CM | POA: Diagnosis not present

## 2018-11-21 DIAGNOSIS — M6281 Muscle weakness (generalized): Secondary | ICD-10-CM | POA: Diagnosis not present

## 2018-11-21 DIAGNOSIS — R1311 Dysphagia, oral phase: Secondary | ICD-10-CM | POA: Diagnosis not present

## 2018-11-21 DIAGNOSIS — I872 Venous insufficiency (chronic) (peripheral): Secondary | ICD-10-CM | POA: Diagnosis not present

## 2018-11-21 DIAGNOSIS — Z9181 History of falling: Secondary | ICD-10-CM | POA: Diagnosis not present

## 2018-11-21 DIAGNOSIS — Z8701 Personal history of pneumonia (recurrent): Secondary | ICD-10-CM | POA: Diagnosis not present

## 2018-11-21 DIAGNOSIS — I10 Essential (primary) hypertension: Secondary | ICD-10-CM | POA: Diagnosis not present

## 2018-11-22 DIAGNOSIS — Z8701 Personal history of pneumonia (recurrent): Secondary | ICD-10-CM | POA: Diagnosis not present

## 2018-11-22 DIAGNOSIS — I10 Essential (primary) hypertension: Secondary | ICD-10-CM | POA: Diagnosis not present

## 2018-11-22 DIAGNOSIS — R1311 Dysphagia, oral phase: Secondary | ICD-10-CM | POA: Diagnosis not present

## 2018-11-22 DIAGNOSIS — I872 Venous insufficiency (chronic) (peripheral): Secondary | ICD-10-CM | POA: Diagnosis not present

## 2018-11-22 DIAGNOSIS — M6281 Muscle weakness (generalized): Secondary | ICD-10-CM | POA: Diagnosis not present

## 2018-11-22 DIAGNOSIS — Z9181 History of falling: Secondary | ICD-10-CM | POA: Diagnosis not present

## 2018-11-23 DIAGNOSIS — I10 Essential (primary) hypertension: Secondary | ICD-10-CM | POA: Diagnosis not present

## 2018-11-23 DIAGNOSIS — I872 Venous insufficiency (chronic) (peripheral): Secondary | ICD-10-CM | POA: Diagnosis not present

## 2018-11-23 DIAGNOSIS — M6281 Muscle weakness (generalized): Secondary | ICD-10-CM | POA: Diagnosis not present

## 2018-11-23 DIAGNOSIS — Z9181 History of falling: Secondary | ICD-10-CM | POA: Diagnosis not present

## 2018-11-24 DIAGNOSIS — Z9181 History of falling: Secondary | ICD-10-CM | POA: Diagnosis not present

## 2018-11-24 DIAGNOSIS — I10 Essential (primary) hypertension: Secondary | ICD-10-CM | POA: Diagnosis not present

## 2018-11-24 DIAGNOSIS — I872 Venous insufficiency (chronic) (peripheral): Secondary | ICD-10-CM | POA: Diagnosis not present

## 2018-11-24 DIAGNOSIS — M6281 Muscle weakness (generalized): Secondary | ICD-10-CM | POA: Diagnosis not present

## 2018-11-26 DIAGNOSIS — Z9181 History of falling: Secondary | ICD-10-CM | POA: Diagnosis not present

## 2018-11-26 DIAGNOSIS — M6281 Muscle weakness (generalized): Secondary | ICD-10-CM | POA: Diagnosis not present

## 2018-11-26 DIAGNOSIS — I10 Essential (primary) hypertension: Secondary | ICD-10-CM | POA: Diagnosis not present

## 2018-11-26 DIAGNOSIS — I872 Venous insufficiency (chronic) (peripheral): Secondary | ICD-10-CM | POA: Diagnosis not present

## 2018-11-27 ENCOUNTER — Encounter: Payer: Self-pay | Admitting: *Deleted

## 2018-11-27 DIAGNOSIS — M6281 Muscle weakness (generalized): Secondary | ICD-10-CM | POA: Diagnosis not present

## 2018-11-27 DIAGNOSIS — I10 Essential (primary) hypertension: Secondary | ICD-10-CM | POA: Diagnosis not present

## 2018-11-27 DIAGNOSIS — Z9181 History of falling: Secondary | ICD-10-CM | POA: Diagnosis not present

## 2018-11-27 DIAGNOSIS — I872 Venous insufficiency (chronic) (peripheral): Secondary | ICD-10-CM | POA: Diagnosis not present

## 2018-11-28 DIAGNOSIS — M6281 Muscle weakness (generalized): Secondary | ICD-10-CM | POA: Diagnosis not present

## 2018-11-28 DIAGNOSIS — I872 Venous insufficiency (chronic) (peripheral): Secondary | ICD-10-CM | POA: Diagnosis not present

## 2018-11-28 DIAGNOSIS — Z9181 History of falling: Secondary | ICD-10-CM | POA: Diagnosis not present

## 2018-11-28 DIAGNOSIS — I10 Essential (primary) hypertension: Secondary | ICD-10-CM | POA: Diagnosis not present

## 2018-11-29 DIAGNOSIS — I10 Essential (primary) hypertension: Secondary | ICD-10-CM | POA: Diagnosis not present

## 2018-11-29 DIAGNOSIS — M6281 Muscle weakness (generalized): Secondary | ICD-10-CM | POA: Diagnosis not present

## 2018-11-29 DIAGNOSIS — Z9181 History of falling: Secondary | ICD-10-CM | POA: Diagnosis not present

## 2018-11-29 DIAGNOSIS — I872 Venous insufficiency (chronic) (peripheral): Secondary | ICD-10-CM | POA: Diagnosis not present

## 2018-11-30 DIAGNOSIS — Z9181 History of falling: Secondary | ICD-10-CM | POA: Diagnosis not present

## 2018-11-30 DIAGNOSIS — I10 Essential (primary) hypertension: Secondary | ICD-10-CM | POA: Diagnosis not present

## 2018-11-30 DIAGNOSIS — M6281 Muscle weakness (generalized): Secondary | ICD-10-CM | POA: Diagnosis not present

## 2018-11-30 DIAGNOSIS — I872 Venous insufficiency (chronic) (peripheral): Secondary | ICD-10-CM | POA: Diagnosis not present

## 2018-12-03 DIAGNOSIS — I872 Venous insufficiency (chronic) (peripheral): Secondary | ICD-10-CM | POA: Diagnosis not present

## 2018-12-03 DIAGNOSIS — I10 Essential (primary) hypertension: Secondary | ICD-10-CM | POA: Diagnosis not present

## 2018-12-03 DIAGNOSIS — M6281 Muscle weakness (generalized): Secondary | ICD-10-CM | POA: Diagnosis not present

## 2018-12-03 DIAGNOSIS — Z9181 History of falling: Secondary | ICD-10-CM | POA: Diagnosis not present

## 2018-12-04 DIAGNOSIS — Z9181 History of falling: Secondary | ICD-10-CM | POA: Diagnosis not present

## 2018-12-04 DIAGNOSIS — I872 Venous insufficiency (chronic) (peripheral): Secondary | ICD-10-CM | POA: Diagnosis not present

## 2018-12-04 DIAGNOSIS — I10 Essential (primary) hypertension: Secondary | ICD-10-CM | POA: Diagnosis not present

## 2018-12-04 DIAGNOSIS — M6281 Muscle weakness (generalized): Secondary | ICD-10-CM | POA: Diagnosis not present

## 2018-12-05 DIAGNOSIS — I10 Essential (primary) hypertension: Secondary | ICD-10-CM | POA: Diagnosis not present

## 2018-12-05 DIAGNOSIS — I872 Venous insufficiency (chronic) (peripheral): Secondary | ICD-10-CM | POA: Diagnosis not present

## 2018-12-05 DIAGNOSIS — Z9181 History of falling: Secondary | ICD-10-CM | POA: Diagnosis not present

## 2018-12-05 DIAGNOSIS — M6281 Muscle weakness (generalized): Secondary | ICD-10-CM | POA: Diagnosis not present

## 2018-12-06 DIAGNOSIS — M6281 Muscle weakness (generalized): Secondary | ICD-10-CM | POA: Diagnosis not present

## 2018-12-06 DIAGNOSIS — I10 Essential (primary) hypertension: Secondary | ICD-10-CM | POA: Diagnosis not present

## 2018-12-06 DIAGNOSIS — I872 Venous insufficiency (chronic) (peripheral): Secondary | ICD-10-CM | POA: Diagnosis not present

## 2018-12-06 DIAGNOSIS — Z9181 History of falling: Secondary | ICD-10-CM | POA: Diagnosis not present

## 2018-12-07 DIAGNOSIS — I872 Venous insufficiency (chronic) (peripheral): Secondary | ICD-10-CM | POA: Diagnosis not present

## 2018-12-07 DIAGNOSIS — Z9181 History of falling: Secondary | ICD-10-CM | POA: Diagnosis not present

## 2018-12-07 DIAGNOSIS — M6281 Muscle weakness (generalized): Secondary | ICD-10-CM | POA: Diagnosis not present

## 2018-12-07 DIAGNOSIS — I10 Essential (primary) hypertension: Secondary | ICD-10-CM | POA: Diagnosis not present

## 2018-12-10 DIAGNOSIS — M6281 Muscle weakness (generalized): Secondary | ICD-10-CM | POA: Diagnosis not present

## 2018-12-10 DIAGNOSIS — I872 Venous insufficiency (chronic) (peripheral): Secondary | ICD-10-CM | POA: Diagnosis not present

## 2018-12-10 DIAGNOSIS — I10 Essential (primary) hypertension: Secondary | ICD-10-CM | POA: Diagnosis not present

## 2018-12-10 DIAGNOSIS — Z9181 History of falling: Secondary | ICD-10-CM | POA: Diagnosis not present

## 2018-12-11 DIAGNOSIS — Z9181 History of falling: Secondary | ICD-10-CM | POA: Diagnosis not present

## 2018-12-11 DIAGNOSIS — I872 Venous insufficiency (chronic) (peripheral): Secondary | ICD-10-CM | POA: Diagnosis not present

## 2018-12-11 DIAGNOSIS — I10 Essential (primary) hypertension: Secondary | ICD-10-CM | POA: Diagnosis not present

## 2018-12-11 DIAGNOSIS — M6281 Muscle weakness (generalized): Secondary | ICD-10-CM | POA: Diagnosis not present

## 2018-12-12 ENCOUNTER — Non-Acute Institutional Stay (SKILLED_NURSING_FACILITY): Payer: Medicare Other | Admitting: Adult Health

## 2018-12-12 ENCOUNTER — Encounter: Payer: Self-pay | Admitting: Adult Health

## 2018-12-12 DIAGNOSIS — J45909 Unspecified asthma, uncomplicated: Secondary | ICD-10-CM

## 2018-12-12 DIAGNOSIS — Z7189 Other specified counseling: Secondary | ICD-10-CM

## 2018-12-12 DIAGNOSIS — I872 Venous insufficiency (chronic) (peripheral): Secondary | ICD-10-CM | POA: Diagnosis not present

## 2018-12-12 DIAGNOSIS — E559 Vitamin D deficiency, unspecified: Secondary | ICD-10-CM

## 2018-12-12 DIAGNOSIS — E119 Type 2 diabetes mellitus without complications: Secondary | ICD-10-CM

## 2018-12-12 DIAGNOSIS — M159 Polyosteoarthritis, unspecified: Secondary | ICD-10-CM

## 2018-12-12 DIAGNOSIS — I10 Essential (primary) hypertension: Secondary | ICD-10-CM

## 2018-12-12 DIAGNOSIS — M6281 Muscle weakness (generalized): Secondary | ICD-10-CM | POA: Diagnosis not present

## 2018-12-12 DIAGNOSIS — R930 Abnormal findings on diagnostic imaging of skull and head, not elsewhere classified: Secondary | ICD-10-CM | POA: Diagnosis not present

## 2018-12-12 DIAGNOSIS — Z9181 History of falling: Secondary | ICD-10-CM | POA: Diagnosis not present

## 2018-12-12 DIAGNOSIS — M150 Primary generalized (osteo)arthritis: Secondary | ICD-10-CM | POA: Diagnosis not present

## 2018-12-12 DIAGNOSIS — K219 Gastro-esophageal reflux disease without esophagitis: Secondary | ICD-10-CM

## 2018-12-12 NOTE — Progress Notes (Signed)
Location:  [place] Room Number: 203-A Place of Service:  SNF (31) Provider:  Durenda Age, DNP, FNP-BC  No care team member to display  Extended Emergency Contact Information Primary Emergency Contact: Karema, [place] Home Phone: [phone] Relation: Sister Secondary Emergency Contact: Sandria Manly Mobile Phone: [phone] Relation: Niece  Code Status:  Full Code  Goals of care: Advanced Directive information Advanced Directives 11/19/2018  Does Patient Have a Medical Advance Directive? Yes  Type of Advance Directive (No Data)  Does patient want to make changes to medical advance directive? No - Patient declined  Would patient like information on creating a medical advance directive? -  Pre-existing out of facility DNR order (yellow form or pink MOST form) -     Chief Complaint  Patient presents with   Medical Management of Chronic Issues    Routine [facility] visit   Advanced Directive    Care Plan Meeting    HPI:  Pt is a 68 y.o. female seen today for medical management of chronic diseases, as well as a care plan meeting.  She is a long-term care resident of [facility].  She has a PMH of seizure, DM, carpal tunnel syndrome, and asthma. She was seen in her room today prior to care plan. She said that her only problem at this time are her knees which sometimes bother in the morning. She is currently taking Naprosyn for her Osteoarthritis. CBGs noted to be well-controlled -84, 124, 131, 117, 119. She currently take Metformin for her DM. BPs are well-controlled -136/72, 132/82, 130/99. She currently take Amlodipine and Lisinopril for hypertension.  Care plan meeting was attended by NP, social worker, MDS coordinator, dietician and the resident. Recent BIMS score 10/15, mild cognitive deficit. She does not remember where she is but is aware of the time and her birthday. She said that her room has been moved  several times so she cannot keep up with the details. Her weight is stable at 178 lbs. She likes to remain full code at this time. The care plan meeting lasted for 18 minutes.   Past Medical History:  Diagnosis Date   Allergic rhinitis    Arthritis    "ankles, feet, knees" (10/30/2017)   Asthma    Bursitis    Carpal tunnel syndrome    GERD (gastroesophageal reflux disease)    HTN (hypertension)    Migraine    "a couple/yr" (10/30/2017)   Obesity    Peripheral vascular disease ([place])    Pneumonia 1960s X 1   Sciatica    Tendinitis    Type II diabetes mellitus ([place])    Type II   UTI (urinary tract infection)    Past Surgical History:  Procedure Laterality Date   CRANIOTOMY N/A 01/19/2018   Procedure: ENDOSCOPIC TRANSPHENOIDAL RESECTION OF PITUITARY TUMOR;  Surgeon: Consuella Lose, MD;  Location: [place];  Service: Neurosurgery;  Laterality: N/A;  ENDOSCOPIC TRANSPHENOIDAL RESECTION OF PITUITARY TUMOR   GANGLION CYST EXCISION Left    KNEE ARTHROSCOPY Left 2003   PLANTAR FASCIA RELEASE Right 1995   "heel"   SINUS ENDO WITH FUSION N/A 01/19/2018   Procedure: SINUS ENDO WITH FUSION;  Surgeon: Jerrell Belfast, MD;  Location: [place];  Service: ENT;  Laterality: N/A;   [place] N/A 01/19/2018   Procedure: TRANSPHENOIDAL  RESECTION OF PITUITARY TUMOR;  Surgeon: Jerrell Belfast, MD;  Location: [place];  Service: ENT;  Laterality: N/A;  [place]; 1997   Dr [name]    Allergies  Allergen Reactions   Penicillins Hives and Other (See Comments)    ++ got keflex in 2013 and 2019++ Has patient had a PCN reaction causing immediate rash, facial/tongue/throat swelling, SOB or lightheadedness with hypotension: No Has patient had a PCN reaction causing severe rash involving mucus membranes or skin necrosis: No PATIENT HAS HAD A PCN REACTION THAT REQUIRED HOSPITALIZATION:  #  #  YES  #  #  Has patient had a PCN  reaction occurring within the last 10 years: No    Codeine Nausea And Vomiting   Indomethacin Diarrhea    Outpatient Encounter Medications as of 12/12/2018  Medication Sig   acetaminophen (TYLENOL) 325 MG tablet Take 650 mg by mouth every 8 (eight) hours as needed for mild pain or moderate pain.    ADVAIR DISKUS 100-50 MCG/DOSE AEPB Inhale 1 puff into the lungs 2 (two) times daily.   albuterol (PROVENTIL HFA;VENTOLIN HFA) 108 (90 Base) MCG/ACT inhaler Inhale 2 puffs into the lungs every 6 (six) hours as needed for wheezing or shortness of breath.   AMLODIPINE BESYLATE PO 10 mg. TAKE 1 TABLET BY MOUTH ONCE DAILY FOR HTN   bisacodyl (DULCOLAX) 10 MG suppository Constipation (2 of 4): If not relieved by MOM, give 10 mg Bisacodyl suppositiory rectally X 1 dose in 24 hours as needed (Do not use constipation standing orders for residents with renal failure/CFR less than 30. Contact MD for orders)   Cholecalciferol (VITAMIN D3) 125 MCG (5000 UT) CAPS GIVE 1 CAPSULE BY MOUTH ONCE DAILY   esomeprazole (NEXIUM) 20 MG capsule Take 1 capsule (20 mg total) by mouth every morning. For acid reflex   furosemide (LASIX) 40 MG tablet TAKE ONE TABLET BY MOUTH EVERY DAY   levETIRAcetam (KEPPRA) 500 MG tablet Take 1 tablet (500 mg total) by mouth 2 (two) times daily.   lisinopril (PRINIVIL,ZESTRIL) 40 MG tablet Take 40 mg by mouth daily.    magnesium hydroxide (MILK OF MAGNESIA) 400 MG/5ML suspension Constipation (1 of 4): If no BM in 3 days, give 30 cc Milk of Magnesium p.o. x 1 dose in 24 hours as needed (Do not use standing constipation orders for residents with renal failure CFR less than 30. Contact MD for orders)   metFORMIN (GLUCOPHAGE) 500 MG tablet Take 500 mg by mouth 2 (two) times daily with a meal.   mometasone (NASONEX) 50 MCG/ACT nasal spray Place 1 spray into the nose as needed.   naproxen (NAPROSYN) 500 MG tablet Take 500 mg by mouth 2 (two) times daily as needed for moderate pain  (Joint pain). Take after meals   Nutritional Supplements (NUTRITIONAL SUPPLEMENT PO) Take 1 each by mouth daily. Magic Cup   No facility-administered encounter medications on file as of 12/12/2018.     Review of Systems  GENERAL: No change in appetite, no fatigue, no weight changes, no fever, chills or weakness SKIN: black lesions on bilateral lower legs MOUTH and THROAT: Denies oral discomfort, gingival pain or bleeding RESPIRATORY: no cough, SOB, DOE, wheezing, hemoptysis CARDIAC: No chest pain, or palpitations GI: No abdominal pain, diarrhea, constipation, heart burn, nausea or vomiting GU: Denies dysuria, frequency, hematuria, or discharge MUSCULOSKELETAL: + joint pains occasionally NEUROLOGICAL: Denies dizziness, syncope, numbness, or headache PSYCHIATRIC: Denies feelings of depression or anxiety. No report of hallucinations, insomnia, paranoia, or agitation    Immunization History  Administered Date(s) Administered   Influenza Split 03/08/2013  Influenza Whole 09/03/2009, 06/03/2010   Influenza,inj,Quad PF,6+ Mos 06/25/2014   Influenza-Unspecified 06/08/2017   Pneumococcal Polysaccharide-23 07/25/2002, 10/31/2017   Td 07/25/2005   Pertinent  Health Maintenance Due  Topic Date Due   COLONOSCOPY  09/02/2002   MAMMOGRAM  01/14/2017   DEXA SCAN  09/02/2017   OPHTHALMOLOGY EXAM  10/11/2018   PNA vac Low Risk Adult (2 of 2 - PCV13) 11/01/2018   HEMOGLOBIN A1C  12/05/2018   INFLUENZA VACCINE  02/23/2019   FOOT EXAM  10/09/2019   Fall Risk  03/06/2018 10/20/2017 09/01/2017 08/15/2017 07/28/2017  Falls in the past year? Yes Yes Yes Yes Yes  Number falls in past yr: 1 2 or more 2 or more 1 2 or more  Comment - - 7 times  - -  Injury with Fall? Yes Yes No No Yes  Comment - Broken jaw - - -  Risk Factor Category  High Fall Risk High Fall Risk High Fall Risk High Fall Risk High Fall Risk  Risk for fall due to : Impaired balance/gait History of fall(s);Impaired  mobility;Impaired balance/gait Impaired balance/gait;Impaired mobility Impaired balance/gait;Impaired mobility History of fall(s);Medication side effect;Impaired mobility  Follow up Falls prevention discussed - Falls prevention discussed Education provided Falls prevention discussed     Vitals:   12/12/18 1254  BP: 126/72  Pulse: 88  Resp: 18  Temp: 98 F (36.7 C)  TempSrc: Oral  Weight: 178 lb (80.7 kg)  Height: 5\' 1"  (1.549 m)   Body mass index is 33.63 kg/m.  Physical Exam  GENERAL APPEARANCE: Well nourished. In no acute distress. Obese SKIN:  Dry black lesions on bilateral lower legs MOUTH and THROAT: Lips are without lesions. Oral mucosa is moist and without lesions. Tongue is normal in shape, size, and color and without lesions RESPIRATORY: Breathing is even & unlabored, BS CTAB CARDIAC: RRR, no murmur,no extra heart sounds, BLE 2+ edema GI: Abdomen soft, normal BS, no masses, no tenderness EXTREMITIES:  Able to move x 4 extremities NEUROLOGICAL: There is no tremor. Speech is clear. Alert to self and time, disoriented to place PSYCHIATRIC:  Affect and behavior are appropriate   Labs reviewed: Recent Labs    06/02/18 0356 06/03/18 0548 06/04/18 0501 06/07/18 07/06/18 09/06/18  NA 137 136 136 140 142 138  K 3.4* 3.7 3.6 4.3 4.4 4.3  CL 106 104 104  --   --   --   CO2 23 25 25   --   --   --   GLUCOSE 104* 137* 126*  --   --   --   BUN 12 13 17  25* 21 16  CREATININE 1.05* 1.09* 0.91 0.8 1.1 1.2*  CALCIUM 8.1* 8.5* 8.6*  --   --   --    Recent Labs    04/25/18 0635 05/31/18 1401  AST 18 34  ALT 13 17  ALKPHOS 67 62  BILITOT 0.4 0.6  PROT 9.0* 9.1*  ALBUMIN 3.2* 3.4*   Recent Labs    06/01/18 0035 06/03/18 0548 06/04/18 0501 06/07/18 09/06/18 09/28/18  WBC 12.9* 13.5* 13.7* 16.9 14.5 13.6  NEUTROABS 9.6*  --   --  14 11 10   HGB 12.0 10.5* 11.3* 12.6 10.7* 10.7*  HCT 37.9 34.1* 35.8* 38 32* 32*  MCV 90.2 90.2 89.5  --   --   --   PLT 395 424* 415*  433* 422* 412*   Lab Results  Component Value Date   TSH 1.89 10/02/2018   Lab Results  Component Value Date   HGBA1C 5.9 09/06/2018   Lab Results  Component Value Date   CHOL 178 08/30/2012   HDL 49 08/30/2012   LDLCALC 92 08/30/2012   TRIG 187 (H) 08/30/2012   CHOLHDL 3.6 08/30/2012     Assessment/Plan  1. Primary osteoarthritis involving multiple joints - continues to have occasional knee pain in the morning, continue Naprosyn   2. Essential hypertension - BPs well-controlled, amlodipine 10 mg 1 tab daily, Lasix 40 mg 1 tab daily and lisinopril 40 mg 1 tab daily  3. Gastroesophageal reflux disease without esophagitis -Stable, continue esomeprazole 20 mg 1 capsule daily  4. Vitamin D deficiency -Continue vitamin D3 125 mcg 1 capsule daily  5. Type 2 diabetes mellitus treated without insulin (HCC) Lab Results  Component Value Date   HGBA1C 5.9 09/06/2018  -Continue metformin 500 mg twice a day  6. Moderate asthma without complication, unspecified whether persistent -No SOB nor wheezing, continue albuterol inhaler as needed and Advair 100-50 DISK 1 puff into the lungs twice a day  7. Mass in region of sella turcica  - no recent seizures, continue Keppra 500 mg 1 tab twice a day, fall precautions  8. Advance care planning -Discussed code status, medications, vital signs, CBGs, weights and plan of care   Family/ staff Communication: Discussed plan of care with resident and IDT.  Labs/tests ordered:  None  Goals of care:   Long-term care.   Durenda Age, DNP, FNP-BC [facility] [phone] (Monday-Friday 8:00 a.m. - 5:00 p.m.) [phone] (after hours)

## 2018-12-13 DIAGNOSIS — I872 Venous insufficiency (chronic) (peripheral): Secondary | ICD-10-CM | POA: Diagnosis not present

## 2018-12-13 DIAGNOSIS — M6281 Muscle weakness (generalized): Secondary | ICD-10-CM | POA: Diagnosis not present

## 2018-12-13 DIAGNOSIS — Z9181 History of falling: Secondary | ICD-10-CM | POA: Diagnosis not present

## 2018-12-13 DIAGNOSIS — I10 Essential (primary) hypertension: Secondary | ICD-10-CM | POA: Diagnosis not present

## 2018-12-14 DIAGNOSIS — I10 Essential (primary) hypertension: Secondary | ICD-10-CM | POA: Diagnosis not present

## 2018-12-14 DIAGNOSIS — Z9181 History of falling: Secondary | ICD-10-CM | POA: Diagnosis not present

## 2018-12-14 DIAGNOSIS — I872 Venous insufficiency (chronic) (peripheral): Secondary | ICD-10-CM | POA: Diagnosis not present

## 2018-12-14 DIAGNOSIS — M6281 Muscle weakness (generalized): Secondary | ICD-10-CM | POA: Diagnosis not present

## 2018-12-17 DIAGNOSIS — Z9181 History of falling: Secondary | ICD-10-CM | POA: Diagnosis not present

## 2018-12-17 DIAGNOSIS — I10 Essential (primary) hypertension: Secondary | ICD-10-CM | POA: Diagnosis not present

## 2018-12-17 DIAGNOSIS — M6281 Muscle weakness (generalized): Secondary | ICD-10-CM | POA: Diagnosis not present

## 2018-12-17 DIAGNOSIS — I872 Venous insufficiency (chronic) (peripheral): Secondary | ICD-10-CM | POA: Diagnosis not present

## 2018-12-18 DIAGNOSIS — I872 Venous insufficiency (chronic) (peripheral): Secondary | ICD-10-CM | POA: Diagnosis not present

## 2018-12-18 DIAGNOSIS — I10 Essential (primary) hypertension: Secondary | ICD-10-CM | POA: Diagnosis not present

## 2018-12-18 DIAGNOSIS — Z9181 History of falling: Secondary | ICD-10-CM | POA: Diagnosis not present

## 2018-12-18 DIAGNOSIS — M6281 Muscle weakness (generalized): Secondary | ICD-10-CM | POA: Diagnosis not present

## 2018-12-19 ENCOUNTER — Non-Acute Institutional Stay (SKILLED_NURSING_FACILITY): Payer: Medicare Other | Admitting: Internal Medicine

## 2018-12-19 ENCOUNTER — Encounter: Payer: Self-pay | Admitting: Internal Medicine

## 2018-12-19 DIAGNOSIS — I872 Venous insufficiency (chronic) (peripheral): Secondary | ICD-10-CM | POA: Diagnosis not present

## 2018-12-19 DIAGNOSIS — G8929 Other chronic pain: Secondary | ICD-10-CM

## 2018-12-19 DIAGNOSIS — E119 Type 2 diabetes mellitus without complications: Secondary | ICD-10-CM | POA: Diagnosis not present

## 2018-12-19 DIAGNOSIS — I10 Essential (primary) hypertension: Secondary | ICD-10-CM

## 2018-12-19 DIAGNOSIS — M6281 Muscle weakness (generalized): Secondary | ICD-10-CM | POA: Diagnosis not present

## 2018-12-19 DIAGNOSIS — M25562 Pain in left knee: Secondary | ICD-10-CM | POA: Diagnosis not present

## 2018-12-19 DIAGNOSIS — M25561 Pain in right knee: Secondary | ICD-10-CM

## 2018-12-19 DIAGNOSIS — Z9181 History of falling: Secondary | ICD-10-CM | POA: Diagnosis not present

## 2018-12-19 NOTE — Progress Notes (Signed)
Location:    [place] Room Number: 203/A Place of Service:  SNF (31) Provider:  [place]  No primary care provider on file.  No care team member to display  Extended Emergency Contact Information Primary Emergency Contact: Ardra, [place] Home Phone: [phone] Relation: Sister Secondary Emergency Contact: Sandria Manly Mobile Phone: [phone] Relation: Niece  Code Status: Full Code  Goals of care: Advanced Directive information Advanced Directives 12/19/2018  Does Patient Have a Medical Advance Directive? Yes  Type of Advance Directive (No Data)  Does patient want to make changes to medical advance directive? No - Patient declined  Would patient like information on creating a medical advance directive? -  Pre-existing out of facility DNR order (yellow form or pink MOST form) -     Chief Complaint  Patient presents with  . Acute Visit    Right knee pain    HPI:  Pt is a 68 y.o. female seen today for an acute visit for complaints of right knee pain.  She is a long term resident of facility with a history of osteoarthritis with multiple joints as well as hypertension GERD vitamin D deficiency type 2 diabetes as well as asthma.  She does have some chronicity of knee pain and apparently this is limiting her activity somewhat.  When I spoke with her today she states she is not currently having knee pain but this is intermittent and appears to be more so at night.  She does have an order for Naprosyn as needed- she also has orders for Tylenol arthritis 650 mg every 6 hours as needed although I am not sure how often she is getting this. She also has orders for Biofreeze to her knees twice a day  Currently she has no other complaints.  In regards to type 2 diabetes she continues on Glucophage 500 twice daily this continues to be well controlled it appears with blood sugars ranging from the 80s to mid 100s generally it  appears rarely above 200.  Her blood pressure also appears to be well controlled on Norvasc 10 mg a day as well as lisinopril 40 mg a day. Patient blood pressures 135/76-127/84.       Past Medical History:  Diagnosis Date  . Allergic rhinitis   . Arthritis    "ankles, feet, knees" (10/30/2017)  . Asthma   . Bursitis   . Carpal tunnel syndrome   . GERD (gastroesophageal reflux disease)   . HTN (hypertension)   . Migraine    "a couple/yr" (10/30/2017)  . Obesity   . Peripheral vascular disease ([place])   . Pneumonia 1960s X 1  . Sciatica   . Tendinitis   . Type II diabetes mellitus (HCC)    Type II  . UTI (urinary tract infection)    Past Surgical History:  Procedure Laterality Date  . CRANIOTOMY N/A 01/19/2018   Procedure: ENDOSCOPIC TRANSPHENOIDAL RESECTION OF PITUITARY TUMOR;  Surgeon: Consuella Lose, MD;  Location: [place];  Service: Neurosurgery;  Laterality: N/A;  ENDOSCOPIC TRANSPHENOIDAL RESECTION OF PITUITARY TUMOR  . GANGLION CYST EXCISION Left   . KNEE ARTHROSCOPY Left 2003  . PLANTAR FASCIA RELEASE Right 1995   "heel"  . SINUS ENDO WITH FUSION N/A 01/19/2018   Procedure: SINUS ENDO WITH FUSION;  Surgeon: Jerrell Belfast, MD;  Location: [place];  Service: ENT;  Laterality: N/A;  . TONSILLECTOMY  1958  . TRANSPHENOIDAL APPROACH EXPOSURE N/A 01/19/2018   Procedure: TRANSPHENOIDAL  RESECTION OF PITUITARY TUMOR;  Surgeon: Jerrell Belfast, MD;  Location: [place];  Service: ENT;  Laterality: N/A;  . [place]; 1997   Dr [name]    Allergies  Allergen Reactions  . Penicillins Hives and Other (See Comments)    ++ got keflex in 2013 and 2019++ Has patient had a PCN reaction causing immediate rash, facial/tongue/throat swelling, SOB or lightheadedness with hypotension: No Has patient had a PCN reaction causing severe rash involving mucus membranes or skin necrosis: No PATIENT HAS HAD A PCN REACTION THAT REQUIRED HOSPITALIZATION:  #  #  YES  #  #  Has  patient had a PCN reaction occurring within the last 10 years: No   . Codeine Nausea And Vomiting  . Indomethacin Diarrhea    Outpatient Encounter Medications as of 12/19/2018  Medication Sig  . acetaminophen (TYLENOL) 325 MG tablet Take 650 mg by mouth every 8 (eight) hours as needed for mild pain or moderate pain.   [name] ADVAIR DISKUS 100-50 MCG/DOSE AEPB Inhale 1 puff into the lungs 2 (two) times daily.  [name] albuterol (PROVENTIL HFA;VENTOLIN HFA) 108 (90 Base) MCG/ACT inhaler Inhale 2 puffs into the lungs every 6 (six) hours as needed for wheezing or shortness of breath.  . AMLODIPINE BESYLATE PO 10 mg. TAKE 1 TABLET BY MOUTH ONCE DAILY FOR HTN  . bisacodyl (DULCOLAX) 10 MG suppository Constipation (2 of 4): If not relieved by MOM, give 10 mg Bisacodyl suppositiory rectally X 1 dose in 24 hours as needed (Do not use constipation standing orders for residents with renal failure/CFR less than 30. Contact MD for orders)  . Cholecalciferol (VITAMIN D3) 125 MCG (5000 UT) CAPS GIVE 1 CAPSULE BY MOUTH ONCE DAILY  . esomeprazole (NEXIUM) 20 MG capsule Take 1 capsule (20 mg total) by mouth every morning. For acid reflex  . furosemide (LASIX) 40 MG tablet TAKE ONE TABLET BY MOUTH EVERY DAY  . levETIRAcetam (KEPPRA) 500 MG tablet Take 1 tablet (500 mg total) by mouth 2 (two) times daily.  [name] lisinopril (PRINIVIL,ZESTRIL) 40 MG tablet Take 40 mg by mouth daily.   . magnesium hydroxide (MILK OF MAGNESIA) 400 MG/5ML suspension Constipation (1 of 4): If no BM in 3 days, give 30 cc Milk of Magnesium p.o. x 1 dose in 24 hours as needed (Do not use standing constipation orders for residents with renal failure CFR less than 30. Contact MD for orders)  . Menthol, Topical Analgesic, (BIOFREEZE) 4 % GEL APPLY TOPICALLY TO BILATERAL KNEES TWICE DAILY FOR OSTEOARTHRITIS  . metFORMIN (GLUCOPHAGE) 500 MG tablet Take 500 mg by mouth 2 (two) times daily with a meal.  . mometasone (NASONEX) 50 MCG/ACT nasal spray Place 1 spray  into the nose as needed.  . naproxen (NAPROSYN) 500 MG tablet Take 500 mg by mouth 2 (two) times daily as needed for moderate pain (Joint pain). Take after meals  . Nutritional Supplements (NUTRITIONAL SUPPLEMENT PO) Take 1 each by mouth daily. Magic Cup   No facility-administered encounter medications on file as of 12/19/2018.     Review of Systems   General she is not complaining of any fever or chills.  Skin does not complain of rashes or itching.  Head ears eyes nose mouth and throat does not complain of sore throat or visual changes.  Respiratory does not complain of shortness of breath or cough she does have a history of mild asthma.  Cardiac does not complain of chest pain has chronic venous  stasis edema.  GI is not complaining of abdominal discomfort nausea vomiting diarrhea constipation.  GU is not complaining of dysuria.  Musculoskeletal does have a history of somewhat chronic knee pain this appears to be intermittent per her report.  Neurologic does not complain of dizziness headache or numbness.  And psych does not complain of being depressed or anxious continues to be in good spirits  Immunization History  Administered Date(s) Administered  . Influenza Split 03/08/2013  . Influenza Whole 09/03/2009, 06/03/2010  . Influenza,inj,Quad PF,6+ Mos 06/25/2014  . Influenza-Unspecified 06/08/2017  . Pneumococcal Polysaccharide-23 07/25/2002, 10/31/2017  . Td 07/25/2005   Pertinent  Health Maintenance Due  Topic Date Due  . HEMOGLOBIN A1C  12/05/2018  . MAMMOGRAM  01/18/2019 (Originally 01/14/2017)  . OPHTHALMOLOGY EXAM  01/19/2019 (Originally 10/11/2018)  . DEXA SCAN  01/19/2019 (Originally 09/02/2017)  . COLONOSCOPY  01/19/2019 (Originally 09/02/2002)  . PNA vac Low Risk Adult (2 of 2 - PCV13) 01/19/2019 (Originally 11/01/2018)  . INFLUENZA VACCINE  02/23/2019  . FOOT EXAM  10/09/2019   Fall Risk  03/06/2018 10/20/2017 09/01/2017 08/15/2017 07/28/2017  Falls in the past year?  Yes Yes Yes Yes Yes  Number falls in past yr: 1 2 or more 2 or more 1 2 or more  Comment - - 7 times  - -  Injury with Fall? Yes Yes No No Yes  Comment - Broken jaw - - -  Risk Factor Category  High Fall Risk High Fall Risk High Fall Risk High Fall Risk High Fall Risk  Risk for fall due to : Impaired balance/gait History of fall(s);Impaired mobility;Impaired balance/gait Impaired balance/gait;Impaired mobility Impaired balance/gait;Impaired mobility History of fall(s);Medication side effect;Impaired mobility  Follow up Falls prevention discussed - Falls prevention discussed Education provided Falls prevention discussed   Functional Status Survey:    Vitals:   12/19/18 1225  BP: 135/76  Pulse: 78  Resp: 19  Temp: 98 F (36.7 C)  TempSrc: Oral  SpO2: 98%    Physical Exam   In general this is a very pleasant female in no distress sitting comfortably in her chair.  Her skin is warm and dry.  Does have venous stasis changes lower extremities bilaterally with hyperpigmentation  Eyes visual acuity appears to be intact sclera and conjunctive are clear.  Oropharynx is clear mucous membranes moist.  Chest she does have some scattered diffuse rhonchi there is no labored breathing.  Heart is regular rate and rhythm without murmur gallop or rub she has chronic venous stasis edema appears to be baseline.  Pedal pulses are palpable but reduced  Abdomen is somewhat obese soft nontender with positive bowel sounds.  Musculoskeletal she does move all extremities x4 she does have arthritic changes of her knees bilaterally but I cannot really appreciate any increased erythema edema beyond baseline or tenderness to palpatio-- with gentle range of motion flexion and extension at the knee she did not really complain of pain There was minimal tenderness to palpation of the areas.  Neurologic is grossly intact her speech is clear could not really appreciate lateralizing findings.  Psych she  continues to be pleasant and appropriate  Labs reviewed: Recent Labs    06/02/18 0356 06/03/18 0548 06/04/18 0501 06/07/18 07/06/18 09/06/18  NA 137 136 136 140 142 138  K 3.4* 3.7 3.6 4.3 4.4 4.3  CL 106 104 104  --   --   --   CO2 23 25 25   --   --   --  GLUCOSE 104* 137* 126*  --   --   --   BUN 12 13 17  25* 21 16  CREATININE 1.05* 1.09* 0.91 0.8 1.1 1.2*  CALCIUM 8.1* 8.5* 8.6*  --   --   --    Recent Labs    04/25/18 0635 05/31/18 1401  AST 18 34  ALT 13 17  ALKPHOS 67 62  BILITOT 0.4 0.6  PROT 9.0* 9.1*  ALBUMIN 3.2* 3.4*   Recent Labs    06/01/18 0035 06/03/18 0548 06/04/18 0501 06/07/18 09/06/18 09/28/18  WBC 12.9* 13.5* 13.7* 16.9 14.5 13.6  NEUTROABS 9.6*  --   --  14 11 10   HGB 12.0 10.5* 11.3* 12.6 10.7* 10.7*  HCT 37.9 34.1* 35.8* 38 32* 32*  MCV 90.2 90.2 89.5  --   --   --   PLT 395 424* 415* 433* 422* 412*   Lab Results  Component Value Date   TSH 1.89 10/02/2018   Lab Results  Component Value Date   HGBA1C 5.9 09/06/2018   Lab Results  Component Value Date   CHOL 178 08/30/2012   HDL 49 08/30/2012   LDLCALC 92 08/30/2012   TRIG 187 (H) 08/30/2012   CHOLHDL 3.6 08/30/2012    Significant Diagnostic Results in last 30 days:  No results found.  Assessment/Plan  #1 history of knee pain-there is chronicity of the of this- at this point will make Tylenol arthritis routine before bedtime this appears to be more so when she is having the pain.  Continue PRN status- also will write note to encourage Tylenol instead of Naprosyn for breakthrough pain and reserve the Naprosyn for severe pain we would like to minimize the use of Naprosyn.  At this point will need monitoring she may need Tylenol routine more than once a day.  2.  Diabetes type 2 appears stable on Glucophage as noted above blood sugars largely are in the 100s.  3-hypertension this appears stable on Norvasc as noted above she is also on lisinopril.  CPT- 99309-of note greater  than 25 minutes spent assessing patient- discussing her status with nursing staff-reviewing her chart and labs-coordinating a plan of care- of note greater than 50% of time spent coordinating a plan of care with input as noted above

## 2018-12-20 DIAGNOSIS — Z9181 History of falling: Secondary | ICD-10-CM | POA: Diagnosis not present

## 2018-12-20 DIAGNOSIS — M6281 Muscle weakness (generalized): Secondary | ICD-10-CM | POA: Diagnosis not present

## 2018-12-20 DIAGNOSIS — I10 Essential (primary) hypertension: Secondary | ICD-10-CM | POA: Diagnosis not present

## 2018-12-20 DIAGNOSIS — I872 Venous insufficiency (chronic) (peripheral): Secondary | ICD-10-CM | POA: Diagnosis not present

## 2018-12-21 ENCOUNTER — Encounter: Payer: Self-pay | Admitting: Internal Medicine

## 2018-12-21 DIAGNOSIS — Z9181 History of falling: Secondary | ICD-10-CM | POA: Diagnosis not present

## 2018-12-21 DIAGNOSIS — I10 Essential (primary) hypertension: Secondary | ICD-10-CM | POA: Diagnosis not present

## 2018-12-21 DIAGNOSIS — I872 Venous insufficiency (chronic) (peripheral): Secondary | ICD-10-CM | POA: Diagnosis not present

## 2018-12-21 DIAGNOSIS — M6281 Muscle weakness (generalized): Secondary | ICD-10-CM | POA: Diagnosis not present

## 2018-12-24 DIAGNOSIS — Z9181 History of falling: Secondary | ICD-10-CM | POA: Diagnosis not present

## 2018-12-24 DIAGNOSIS — I872 Venous insufficiency (chronic) (peripheral): Secondary | ICD-10-CM | POA: Diagnosis not present

## 2018-12-24 DIAGNOSIS — I10 Essential (primary) hypertension: Secondary | ICD-10-CM | POA: Diagnosis not present

## 2018-12-24 DIAGNOSIS — M6281 Muscle weakness (generalized): Secondary | ICD-10-CM | POA: Diagnosis not present

## 2018-12-25 DIAGNOSIS — I872 Venous insufficiency (chronic) (peripheral): Secondary | ICD-10-CM | POA: Diagnosis not present

## 2018-12-25 DIAGNOSIS — Z9181 History of falling: Secondary | ICD-10-CM | POA: Diagnosis not present

## 2018-12-25 DIAGNOSIS — I10 Essential (primary) hypertension: Secondary | ICD-10-CM | POA: Diagnosis not present

## 2018-12-25 DIAGNOSIS — M6281 Muscle weakness (generalized): Secondary | ICD-10-CM | POA: Diagnosis not present

## 2018-12-26 DIAGNOSIS — I872 Venous insufficiency (chronic) (peripheral): Secondary | ICD-10-CM | POA: Diagnosis not present

## 2018-12-26 DIAGNOSIS — Z9181 History of falling: Secondary | ICD-10-CM | POA: Diagnosis not present

## 2018-12-26 DIAGNOSIS — M6281 Muscle weakness (generalized): Secondary | ICD-10-CM | POA: Diagnosis not present

## 2018-12-26 DIAGNOSIS — I10 Essential (primary) hypertension: Secondary | ICD-10-CM | POA: Diagnosis not present

## 2018-12-27 ENCOUNTER — Encounter: Payer: Self-pay | Admitting: Internal Medicine

## 2018-12-27 ENCOUNTER — Non-Acute Institutional Stay (SKILLED_NURSING_FACILITY): Payer: Medicare Other | Admitting: Internal Medicine

## 2018-12-27 DIAGNOSIS — I872 Venous insufficiency (chronic) (peripheral): Secondary | ICD-10-CM | POA: Diagnosis not present

## 2018-12-27 DIAGNOSIS — I10 Essential (primary) hypertension: Secondary | ICD-10-CM | POA: Diagnosis not present

## 2018-12-27 DIAGNOSIS — G8929 Other chronic pain: Secondary | ICD-10-CM

## 2018-12-27 DIAGNOSIS — M25561 Pain in right knee: Secondary | ICD-10-CM | POA: Diagnosis not present

## 2018-12-27 DIAGNOSIS — Z9181 History of falling: Secondary | ICD-10-CM | POA: Diagnosis not present

## 2018-12-27 DIAGNOSIS — E118 Type 2 diabetes mellitus with unspecified complications: Secondary | ICD-10-CM

## 2018-12-27 DIAGNOSIS — E1159 Type 2 diabetes mellitus with other circulatory complications: Secondary | ICD-10-CM | POA: Diagnosis not present

## 2018-12-27 DIAGNOSIS — M25562 Pain in left knee: Secondary | ICD-10-CM | POA: Diagnosis not present

## 2018-12-27 DIAGNOSIS — M6281 Muscle weakness (generalized): Secondary | ICD-10-CM | POA: Diagnosis not present

## 2018-12-27 NOTE — Progress Notes (Signed)
Location:    [facility] Room Number: 230/A Place of Service:  SNF (31) Provider:  [place] [facility]  No primary care provider on file.  No care team member to display  Extended Emergency Contact Information Primary Emergency Contact: Sinead, Hockman Home Phone: [phone] Relation: Sister Secondary Emergency Contact: Sandria Manly Mobile Phone: [phone] Relation: Niece  Code Status:  Full Code Goals of care: Advanced Directive information Advanced Directives 12/27/2018  Does Patient Have a Medical Advance Directive? Yes  Type of Advance Directive (No Data)  Does patient want to make changes to medical advance directive? No - Patient declined  Would patient like information on creating a medical advance directive? -  Pre-existing out of facility DNR order (yellow form or pink MOST form) -     Chief Complaint  Patient presents with  . Acute Visit    Knee Pain    HPI:  Pt is a 68 y.o. female seen today for an acute visit for follow-up of knee pain.  She is a long-term resident of facility has a history of osteoarthritis in addition to GERD hypertension vitamin D deficiency type 2 diabetes.  In addition to aspirin  She  has somewhat chronic knee pain she does have an order for Tylenol I have written to make this routine at night because that was her main complaint with nighttime pain.  She continues with this as needed every 6 hours as well.  She also has an order for Biofreeze twice daily and Naprosyn twice daily as needed we have been trying to limit the use of Naprosyn.  There have been notes that she continues to complain of knee pain however when I spoke to her this morning she stated that the pain appears to be better controlled-she says at times she will have pain in the morning but she has noted that if she takes her Tylenol at night a bit later that this helps in the morning.  Currently she is denying pain.  I did speak  with therapist who was in the room and she says she is doing quite well on therapy which is encouraging   Past Medical History:  Diagnosis Date  . Allergic rhinitis   . Arthritis    "ankles, feet, knees" (10/30/2017)  . Asthma   . Bursitis   . Carpal tunnel syndrome   . GERD (gastroesophageal reflux disease)   . HTN (hypertension)   . Migraine    "a couple/yr" (10/30/2017)  . Obesity   . Peripheral vascular disease ([place])   . Pneumonia 1960s X 1  . Sciatica   . Tendinitis   . Type II diabetes mellitus (HCC)    Type II  . UTI (urinary tract infection)    Past Surgical History:  Procedure Laterality Date  . CRANIOTOMY N/A 01/19/2018   Procedure: ENDOSCOPIC TRANSPHENOIDAL RESECTION OF PITUITARY TUMOR;  Surgeon: Consuella Lose, MD;  Location: [facility];  Service: Neurosurgery;  Laterality: N/A;  ENDOSCOPIC TRANSPHENOIDAL RESECTION OF PITUITARY TUMOR  . GANGLION CYST EXCISION Left   . KNEE ARTHROSCOPY Left 2003  . PLANTAR FASCIA RELEASE Right 1995   "heel"  . SINUS ENDO WITH FUSION N/A 01/19/2018   Procedure: SINUS ENDO WITH FUSION;  Surgeon: Jerrell Belfast, MD;  Location: [facility];  Service: ENT;  Laterality: N/A;  . TONSILLECTOMY  1958  . TRANSPHENOIDAL APPROACH EXPOSURE N/A 01/19/2018   Procedure: TRANSPHENOIDAL  RESECTION OF PITUITARY TUMOR;  Surgeon: Jerrell Belfast, MD;  [facility];  Service: ENT;  Laterality: N/A;  . [place]; 1997   Dr [name]    Allergies  Allergen Reactions  . Penicillins Hives and Other (See Comments)    ++ got keflex in 2013 and 2019++ Has patient had a PCN reaction causing immediate rash, facial/tongue/throat swelling, SOB or lightheadedness with hypotension: No Has patient had a PCN reaction causing severe rash involving mucus membranes or skin necrosis: No PATIENT HAS HAD A PCN REACTION THAT REQUIRED HOSPITALIZATION:  #  #  YES  #  #  Has patient had a PCN reaction occurring within the last 10 years: No   . Codeine Nausea And  Vomiting  . Indomethacin Diarrhea    Outpatient Encounter Medications as of 12/27/2018  Medication Sig  . acetaminophen (TYLENOL) 650 MG CR tablet Take 650 mg by mouth every 8 (eight) hours as needed for pain.  [name] ADVAIR DISKUS 100-50 MCG/DOSE AEPB Inhale 1 puff into the lungs 2 (two) times daily.  [name] albuterol (PROVENTIL HFA;VENTOLIN HFA) 108 (90 Base) MCG/ACT inhaler Inhale 2 puffs into the lungs every 6 (six) hours as needed for wheezing or shortness of breath.  . AMLODIPINE BESYLATE PO 10 mg. TAKE 1 TABLET BY MOUTH ONCE DAILY FOR HTN  . bisacodyl (DULCOLAX) 10 MG suppository Constipation (2 of 4): If not relieved by MOM, give 10 mg Bisacodyl suppositiory rectally X 1 dose in 24 hours as needed (Do not use constipation standing orders for residents with renal failure/CFR less than 30. Contact MD for orders)  . Cholecalciferol (VITAMIN D3) 125 MCG (5000 UT) CAPS GIVE 1 CAPSULE BY MOUTH ONCE DAILY  . esomeprazole (NEXIUM) 20 MG capsule Take 1 capsule (20 mg total) by mouth every morning. For acid reflex  . furosemide (LASIX) 40 MG tablet TAKE ONE TABLET BY MOUTH EVERY DAY  . levETIRAcetam (KEPPRA) 500 MG tablet Take 1 tablet (500 mg total) by mouth 2 (two) times daily.  [name] lisinopril (PRINIVIL,ZESTRIL) 40 MG tablet Take 40 mg by mouth daily.   . magnesium hydroxide (MILK OF MAGNESIA) 400 MG/5ML suspension Constipation (1 of 4): If no BM in 3 days, give 30 cc Milk of Magnesium p.o. x 1 dose in 24 hours as needed (Do not use standing constipation orders for residents with renal failure CFR less than 30. Contact MD for orders)  . Menthol, Topical Analgesic, (BIOFREEZE) 4 % GEL APPLY TOPICALLY TO BILATERAL KNEES TWICE DAILY FOR OSTEOARTHRITIS  . metFORMIN (GLUCOPHAGE) 500 MG tablet Take 500 mg by mouth 2 (two) times daily with a meal.  . mometasone (NASONEX) 50 MCG/ACT nasal spray Place 1 spray into the nose as needed.  . naproxen (NAPROSYN) 500 MG tablet Take 500 mg by mouth 2 (two) times daily as  needed for moderate pain (Joint pain). Take after meals  . Nutritional Supplements (NUTRITIONAL SUPPLEMENT PO) Take 1 each by mouth daily. Magic Cup  . [DISCONTINUED] acetaminophen (TYLENOL) 325 MG tablet Take 650 mg by mouth every 8 (eight) hours as needed for mild pain or moderate pain.    No facility-administered encounter medications on file as of 12/27/2018.     Review of Systems   In general she is not complaining of any fever or chills.  Skin does not complain of rashes itching.  Head ears eyes nose mouth and throat no complaints of visual changes or sore throat or nasal discharge.  Respiratory does have a history of asthma but does not complain of shortness of breath increased  cough or wheezing.  Cardiac does not complain of chest pain has chronic venous stasis edema.  GI does not complain of abdominal discomfort nausea vomiting diarrhea or constipation.  GU is not complaining of dysuria.  Musculoskeletal again main complaint in the past has been some knee pain but she is not complaining that this morning.  Neurologic does not complain of dizziness headache or numbness.  And psych continues to be in good spirits does not complain of being depressed or anxious.    Immunization History  Administered Date(s) Administered  . Influenza Split 03/08/2013  . Influenza Whole 09/03/2009, 06/03/2010  . Influenza,inj,Quad PF,6+ Mos 06/25/2014  . Influenza-Unspecified 06/08/2017  . Pneumococcal Polysaccharide-23 07/25/2002, 10/31/2017  . Td 07/25/2005   Pertinent  Health Maintenance Due  Topic Date Due  . MAMMOGRAM  01/18/2019 (Originally 01/14/2017)  . OPHTHALMOLOGY EXAM  01/19/2019 (Originally 10/11/2018)  . DEXA SCAN  01/19/2019 (Originally 09/02/2017)  . COLONOSCOPY  01/19/2019 (Originally 09/02/2002)  . PNA vac Low Risk Adult (2 of 2 - PCV13) 01/19/2019 (Originally 11/01/2018)  . HEMOGLOBIN A1C  01/26/2019 (Originally 12/05/2018)  . INFLUENZA VACCINE  02/23/2019  . FOOT EXAM   10/09/2019   Fall Risk  03/06/2018 10/20/2017 09/01/2017 08/15/2017 07/28/2017  Falls in the past year? Yes Yes Yes Yes Yes  Number falls in past yr: 1 2 or more 2 or more 1 2 or more  Comment - - 7 times  - -  Injury with Fall? Yes Yes No No Yes  Comment - Broken jaw - - -  Risk Factor Category  High Fall Risk High Fall Risk High Fall Risk High Fall Risk High Fall Risk  Risk for fall due to : Impaired balance/gait History of fall(s);Impaired mobility;Impaired balance/gait Impaired balance/gait;Impaired mobility Impaired balance/gait;Impaired mobility History of fall(s);Medication side effect;Impaired mobility  Follow up Falls prevention discussed - Falls prevention discussed Education provided Falls prevention discussed   Functional Status Survey:     She is afebrile pulse is 90 respirations of 18 blood pressure 129/73 Physical Exam   General this is a very pleasant female in no distress sitting comfortably in her wheelchair.  Her skin is warm and dry.  She does have venous stasis changes of her lower extremities with chronic hyperpigmentation.  Eyes visual acuity appears to be intact sclera and conjunctive are relatively clear.  Oropharynx clear mucous membranes moist.  Chest-  continues to have some scattered diffuse rhonchi this is baseline there is no labored breathing  Heart is regular rate and rhythm without murmur gallop or rub she has baseline lower extremity edema pedal pulses are palpable but reduced because of the edema.  Abdomen is somewhat obese soft nontender with positive bowel sounds.  Musculoskeletal moves her extremities at baseline is largely ambulates in a wheelchair- continues to be baseline venous stasis edema of her legs- could not really appreciate tenderness  to palpation of her knees does not really appear to have discomfort with gentle flexion and extension of the knee-- do not note any concerning warmth or erythema  Neurologic is grossly intact could not  really appreciate lateralizing findings speech is clear.  Psych she continues to be pleasant and appropriate Labs reviewed: Recent Labs    06/02/18 0356 06/03/18 0548 06/04/18 0501 06/07/18 07/06/18 09/06/18  NA 137 136 136 140 142 138  K 3.4* 3.7 3.6 4.3 4.4 4.3  CL 106 104 104  --   --   --   CO2 23 25 25   --   --   --  GLUCOSE 104* 137* 126*  --   --   --   BUN 12 13 17  25* 21 16  CREATININE 1.05* 1.09* 0.91 0.8 1.1 1.2*  CALCIUM 8.1* 8.5* 8.6*  --   --   --    Recent Labs    04/25/18 0635 05/31/18 1401  AST 18 34  ALT 13 17  ALKPHOS 67 62  BILITOT 0.4 0.6  PROT 9.0* 9.1*  ALBUMIN 3.2* 3.4*   Recent Labs    06/01/18 0035 06/03/18 0548 06/04/18 0501 06/07/18 09/06/18 09/28/18  WBC 12.9* 13.5* 13.7* 16.9 14.5 13.6  NEUTROABS 9.6*  --   --  14 11 10   HGB 12.0 10.5* 11.3* 12.6 10.7* 10.7*  HCT 37.9 34.1* 35.8* 38 32* 32*  MCV 90.2 90.2 89.5  --   --   --   PLT 395 424* 415* 433* 422* 412*   Lab Results  Component Value Date   TSH 1.89 10/02/2018   Lab Results  Component Value Date   HGBA1C 5.9 09/06/2018   Lab Results  Component Value Date   CHOL 178 08/30/2012   HDL 49 08/30/2012   LDLCALC 92 08/30/2012   TRIG 187 (H) 08/30/2012   CHOLHDL 3.6 08/30/2012    Significant Diagnostic Results in last 30 days:  No results found.  Assessment/Plan #1 knee pain there is some chronicity to this currently she is denying pain- says she is getting relief from the nighttime Tylenol when she takes it a bit later in the night and that gives her relief through the morning hours.  Apparently she is doing well in therapy at this point will monitor continue the routine Tylenol at night as well as as needed every 6 hours during the day- she also has orders for Biofreeze twice daily in addition to Naprosyn when needed for more severe pain we are trying to minimize its use  #2 type 2 diabetes this remains under decent control on Glucophage CBGs continue to be by enlarge in  the lower 100's she is on Glucophage 500 mg twice daily.  3.  Hypertension appears stable she is on Norvasc 10 mg a day as well as lisinopril 40 mg a day recent blood pressures 129/73-104/61- 133/78-114/60.   [place] PA-C [phone]

## 2018-12-28 ENCOUNTER — Encounter: Payer: Self-pay | Admitting: Internal Medicine

## 2018-12-28 DIAGNOSIS — M6281 Muscle weakness (generalized): Secondary | ICD-10-CM | POA: Diagnosis not present

## 2018-12-28 DIAGNOSIS — I872 Venous insufficiency (chronic) (peripheral): Secondary | ICD-10-CM | POA: Diagnosis not present

## 2018-12-28 DIAGNOSIS — I10 Essential (primary) hypertension: Secondary | ICD-10-CM | POA: Diagnosis not present

## 2018-12-28 DIAGNOSIS — Z9181 History of falling: Secondary | ICD-10-CM | POA: Diagnosis not present

## 2018-12-31 ENCOUNTER — Telehealth: Payer: Self-pay | Admitting: *Deleted

## 2018-12-31 ENCOUNTER — Non-Acute Institutional Stay (SKILLED_NURSING_FACILITY): Payer: Medicare Other | Admitting: Internal Medicine

## 2018-12-31 DIAGNOSIS — M25562 Pain in left knee: Secondary | ICD-10-CM | POA: Diagnosis not present

## 2018-12-31 DIAGNOSIS — M25561 Pain in right knee: Secondary | ICD-10-CM

## 2018-12-31 DIAGNOSIS — G8929 Other chronic pain: Secondary | ICD-10-CM

## 2018-12-31 DIAGNOSIS — E119 Type 2 diabetes mellitus without complications: Secondary | ICD-10-CM

## 2018-12-31 DIAGNOSIS — M6281 Muscle weakness (generalized): Secondary | ICD-10-CM | POA: Diagnosis not present

## 2018-12-31 DIAGNOSIS — R635 Abnormal weight gain: Secondary | ICD-10-CM

## 2018-12-31 DIAGNOSIS — Z9181 History of falling: Secondary | ICD-10-CM | POA: Diagnosis not present

## 2018-12-31 DIAGNOSIS — I10 Essential (primary) hypertension: Secondary | ICD-10-CM | POA: Diagnosis not present

## 2018-12-31 DIAGNOSIS — I872 Venous insufficiency (chronic) (peripheral): Secondary | ICD-10-CM | POA: Diagnosis not present

## 2018-12-31 NOTE — Progress Notes (Signed)
This is an acute visit.  Level of care skilled.  Facility is [facility].  Chief complaint-acute visit secondary to possible weight gain.  History of present illness.  Patient is a pleasant 68 year old female who is a long-term resident of facility.  With a history of osteoarthritis as well as GERD hypertension vitamin D deficiency and type 2 diabetes.  She also has significant venous stasis and continues on Lasix 40 mg a day.  Dietitian has left a note that she has gained about 8% and weight the past months current weight 192.6.  Patient continues to deny any shortness of breath or chest pain her venous stasis edema appears to be relatively baseline.  Today she does not really have any complaints.  She has complained of some knee pain occasionally but she says currently the Tylenol appears to be effective when taken at certain times during the day and before bedtime.  She also has topical Biofreeze and Naprosyn as needed  Past Medical History:  Diagnosis Date  . Allergic rhinitis   . Arthritis    "ankles, feet, knees" (10/30/2017)  . Asthma   . Bursitis   . Carpal tunnel syndrome   . GERD (gastroesophageal reflux disease)   . HTN (hypertension)   . Migraine    "a couple/yr" (10/30/2017)  . Obesity   . Peripheral vascular disease ([place])   . Pneumonia 1960s X 1  . Sciatica   . Tendinitis   . Type II diabetes mellitus (H[facility])    Type II  . UTI (urinary tract infection)         Past Surgical History:  Procedure Laterality Date  . CRANIOTOMY N/A 01/19/2018   Procedure: ENDOSCOPIC TRANSPHENOIDAL RESECTION OF PITUITARY TUMOR;  Surgeon: Consuella Lose, MD;  Location: [facility];  Service: Neurosurgery;  Laterality: N/A;  ENDOSCOPIC TRANSPHENOIDAL RESECTION OF PITUITARY TUMOR  . GANGLION CYST EXCISION Left   . KNEE ARTHROSCOPY Left 2003  . PLANTAR FASCIA RELEASE Right 1995   "heel"  . SINUS ENDO WITH FUSION N/A 01/19/2018   Procedure: SINUS ENDO WITH  FUSION;  Surgeon: Jerrell Belfast, MD;  Location: [facility];  Service: ENT;  Laterality: N/A;  . TONSILLECTOMY  1958  . TRANSPHENOIDAL APPROACH EXPOSURE N/A 01/19/2018   Procedure: TRANSPHENOIDAL  RESECTION OF PITUITARY TUMOR;  Surgeon: Jerrell Belfast, MD;  Location: [facility];  Service: ENT;  Laterality: N/A;  . [place]; 1997   Dr [name]         Allergies  Allergen Reactions  . Penicillins Hives and Other (See Comments)    ++ got keflex in 2013 and 2019++ Has patient had a PCN reaction causing immediate rash, facial/tongue/throat swelling, SOB or lightheadedness with hypotension: No Has patient had a PCN reaction causing severe rash involving mucus membranes or skin necrosis: No PATIENT HAS HAD A PCN REACTION THAT REQUIRED HOSPITALIZATION:  #  #  YES  #  #  Has patient had a PCN reaction occurring within the last 10 years: No   . Codeine Nausea And Vomiting  . Indomethacin Diarrhea     MEDICATIONS        Medication Sig  . acetaminophen (TYLENOL) 650 MG CR tablet Take 650 mg by mouth every 8 (eight) hours as needed for pain.  [name] ADVAIR DISKUS 100-50 MCG/DOSE AEPB Inhale 1 puff into the lungs 2 (two) times daily.  [name] albuterol (PROVENTIL HFA;VENTOLIN HFA) 108 (90 Base) MCG/ACT inhaler Inhale 2 puffs into the lungs every 6 (six) hours as  needed for wheezing or shortness of breath.  . AMLODIPINE BESYLATE PO 10 mg. TAKE 1 TABLET BY MOUTH ONCE DAILY FOR HTN  . bisacodyl (DULCOLAX) 10 MG suppository Constipation (2 of 4): If not relieved by MOM, give 10 mg Bisacodyl suppositiory rectally X 1 dose in 24 hours as needed (Do not use constipation standing orders for residents with renal failure/CFR less than 30. Contact MD for orders)  . Cholecalciferol (VITAMIN D3) 125 MCG (5000 UT) CAPS GIVE 1 CAPSULE BY MOUTH ONCE DAILY  . esomeprazole (NEXIUM) 20 MG capsule Take 1 capsule (20 mg total) by mouth every morning. For acid reflex  . furosemide (LASIX) 40 MG tablet TAKE  ONE TABLET BY MOUTH EVERY DAY  . levETIRAcetam (KEPPRA) 500 MG tablet Take 1 tablet (500 mg total) by mouth 2 (two) times daily.  [name] lisinopril (PRINIVIL,ZESTRIL) 40 MG tablet Take 40 mg by mouth daily.   . magnesium hydroxide (MILK OF MAGNESIA) 400 MG/5ML suspension Constipation (1 of 4): If no BM in 3 days, give 30 cc Milk of Magnesium p.o. x 1 dose in 24 hours as needed (Do not use standing constipation orders for residents with renal failure CFR less than 30. Contact MD for orders)  . Menthol, Topical Analgesic, (BIOFREEZE) 4 % GEL APPLY TOPICALLY TO BILATERAL KNEES TWICE DAILY FOR OSTEOARTHRITIS  . metFORMIN (GLUCOPHAGE) 500 MG tablet Take 500 mg by mouth 2 (two) times daily with a meal.  . mometasone (NASONEX) 50 MCG/ACT nasal spray Place 1 spray into the nose as needed.  . naproxen (NAPROSYN) 500 MG tablet Take 500 mg by mouth 2 (two) times daily as needed for moderate pain (Joint pain). Take after meals  . Nutritional Supplements (NUTRITIONAL SUPPLEMENT PO) Take 1 each by mouth daily. Magic Cup  . [DISCONTINUED] acetaminophen (TYLENOL) 325 MG tablet Take 650 mg by mouth every 8 (eight) hours as needed for mild pain or moderate pain.    No facility-administered encounter medications on file as of 12/27/2018.     Review of systems.  In general she does not complain of any fever chills.  Skin does not complain of rashes or itching she does have venous stasis changes most prominently of her lower extremities.  Head ears eyes nose mouth throat does not complain of any visual changes or sore throat.  Respiratory is not complaining of shortness of breath or cough.  Does have a history of asthma   Cardiac does not complain of chest pain he says have chronic lower extremity edema.  GI is not complaining of abdominal pain nausea vomiting diarrhea constipation.  GU does not complain of dysuria.  Musculoskeletal does have a history of chronic knee pain osteoarthritis she says current  medications appear to be fairly effective.  Neurologic does not complain of dizziness headache or syncope.  And psych does not complain of being overtly depressed or anxious  Physical exam.  She is afebrile pulse is 88 respirations of 16 blood pressure 132/60.  General this is a pleasant elderly female in no distress.  Her skin is warm and dry she does have venous stasis changes lower extremities with chronic hyperpigmentation this appears relatively baseline.  Eyes visual acuity appears to be intact sclera and conjunctive are clear.  Oropharynx is clear mucous membranes moist.  Chest is clear to auscultation on past exams has had some diffuse rhonchi but I do not appreciate that on today's exam.  Heart is regular rate and rhythm without murmur gallop or rub her edema appears  to be relatively baseline.  Abdomen is obese soft nontender with positive bowel sounds.  Musculoskeletal does move all extremities x4 she ambulates largely in a wheelchair she does have arthritic changes of her knees bilaterally cannot really appreciate any concerning erythema or increased edema from baseline-- there is no significant warmth.  Neurologic appears grossly intact her speech is clear cannot really appreciate lateralizing findings.  Psych she is pleasant and appropriate has some mild cognitive deficits at baseline largely pleasant and appropriate.  Labs.  September 28, 2018.  WBC 13.6 hemoglobin 10.7 platelets 412.  Sodium 138 potassium 4.3 BUN 16.1 creatinine 1.24.  Assessment and plan.  1.  Weight gain- I spoke with nursing apparently her appetite is relatively baseline eats well at times other times not so great- I could not really appreciate any increased edema from baseline she does have venous stasis changes.  At this point will monitor but will consider possibly increasing her Lasix slightly for short-term if this continues to go up.  Physical exam was quite benign lungs were actually  clearer than I have heard in the past-but this will have to be watched and would have a low threshold for increasing her Lasix for short-term if this progresses. This is complicated somewhat with mild renal insufficiency with a creatinine of 1.24-  Also will await Dr. Clayborn Heron input.  2.-  History of knee pain-this appears to be controlled currently at times she says her knees hurt the Tylenol appears to be effective she also has PRN Biofreeze- we have been trying to minimize the use of Naprosyn  #3 type 2 diabetes this appears to be under good control with blood sugars largely in the 80s to low 100s pretty consistently she is on Glucophage  [phone]

## 2018-12-31 NOTE — Telephone Encounter (Signed)
SPOKE WITH VICKY AT [facility].  WILL HAVE HAVE DEBRA TO RETURN CALL TOMORROW  TO [facility]  10-11 A.M.

## 2019-01-01 ENCOUNTER — Telehealth: Payer: Self-pay | Admitting: *Deleted

## 2019-01-01 ENCOUNTER — Encounter: Payer: Self-pay | Admitting: Internal Medicine

## 2019-01-01 DIAGNOSIS — M6281 Muscle weakness (generalized): Secondary | ICD-10-CM | POA: Diagnosis not present

## 2019-01-01 DIAGNOSIS — I872 Venous insufficiency (chronic) (peripheral): Secondary | ICD-10-CM | POA: Diagnosis not present

## 2019-01-01 DIAGNOSIS — I10 Essential (primary) hypertension: Secondary | ICD-10-CM | POA: Diagnosis not present

## 2019-01-01 DIAGNOSIS — Z9181 History of falling: Secondary | ICD-10-CM | POA: Diagnosis not present

## 2019-01-01 NOTE — Telephone Encounter (Signed)
LEFT MESSAGE WITH OFFICE FOR [name] TO RETURN CALL TO [facility] AT [phone] REGARDING GYN REFERRAL FOR THIS PATIENT./ MS DEBRA WAS NOT IN TODAY.

## 2019-01-02 DIAGNOSIS — I10 Essential (primary) hypertension: Secondary | ICD-10-CM | POA: Diagnosis not present

## 2019-01-02 DIAGNOSIS — I872 Venous insufficiency (chronic) (peripheral): Secondary | ICD-10-CM | POA: Diagnosis not present

## 2019-01-02 DIAGNOSIS — M6281 Muscle weakness (generalized): Secondary | ICD-10-CM | POA: Diagnosis not present

## 2019-01-02 DIAGNOSIS — Z9181 History of falling: Secondary | ICD-10-CM | POA: Diagnosis not present

## 2019-01-03 ENCOUNTER — Telehealth: Payer: Self-pay | Admitting: Obstetrics & Gynecology

## 2019-01-03 DIAGNOSIS — M6281 Muscle weakness (generalized): Secondary | ICD-10-CM | POA: Diagnosis not present

## 2019-01-03 DIAGNOSIS — Z9181 History of falling: Secondary | ICD-10-CM | POA: Diagnosis not present

## 2019-01-03 DIAGNOSIS — I10 Essential (primary) hypertension: Secondary | ICD-10-CM | POA: Diagnosis not present

## 2019-01-03 DIAGNOSIS — I872 Venous insufficiency (chronic) (peripheral): Secondary | ICD-10-CM | POA: Diagnosis not present

## 2019-01-03 NOTE — Telephone Encounter (Signed)
Mariah [name] from [facility] call to make ms.Weill an appt, I scheduled her a Virtual Appt with Dr.Dove, they will have her download the [employer] app.

## 2019-01-04 DIAGNOSIS — I872 Venous insufficiency (chronic) (peripheral): Secondary | ICD-10-CM | POA: Diagnosis not present

## 2019-01-04 DIAGNOSIS — M6281 Muscle weakness (generalized): Secondary | ICD-10-CM | POA: Diagnosis not present

## 2019-01-04 DIAGNOSIS — Z9181 History of falling: Secondary | ICD-10-CM | POA: Diagnosis not present

## 2019-01-04 DIAGNOSIS — I10 Essential (primary) hypertension: Secondary | ICD-10-CM | POA: Diagnosis not present

## 2019-01-07 DIAGNOSIS — I872 Venous insufficiency (chronic) (peripheral): Secondary | ICD-10-CM | POA: Diagnosis not present

## 2019-01-07 DIAGNOSIS — Z9181 History of falling: Secondary | ICD-10-CM | POA: Diagnosis not present

## 2019-01-07 DIAGNOSIS — I10 Essential (primary) hypertension: Secondary | ICD-10-CM | POA: Diagnosis not present

## 2019-01-07 DIAGNOSIS — M6281 Muscle weakness (generalized): Secondary | ICD-10-CM | POA: Diagnosis not present

## 2019-01-08 DIAGNOSIS — I10 Essential (primary) hypertension: Secondary | ICD-10-CM | POA: Diagnosis not present

## 2019-01-08 DIAGNOSIS — M6281 Muscle weakness (generalized): Secondary | ICD-10-CM | POA: Diagnosis not present

## 2019-01-08 DIAGNOSIS — I872 Venous insufficiency (chronic) (peripheral): Secondary | ICD-10-CM | POA: Diagnosis not present

## 2019-01-08 DIAGNOSIS — Z9181 History of falling: Secondary | ICD-10-CM | POA: Diagnosis not present

## 2019-01-08 IMAGING — US US THYROID
1 series · 13 of 25 positions shown · non-contrast
Comparison: CT chest 09/18/2017

CLINICAL DATA: 66-year-old female with a history of goiter

EXAM:
THYROID ULTRASOUND
TECHNIQUE: Ultrasound examination of the thyroid gland and adjacent soft
tissues was performed.

[Series 1: us thyroid · 0.09mm/px · 13 of 48 slices shown]
[im 1/48]
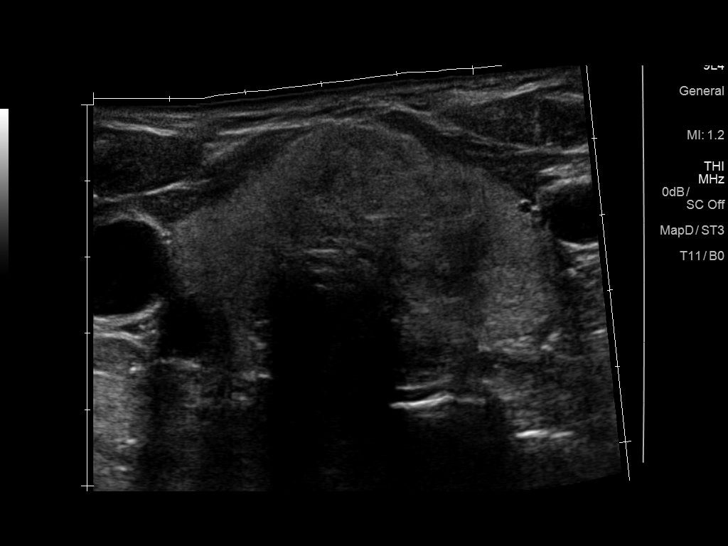
[im 4/48]
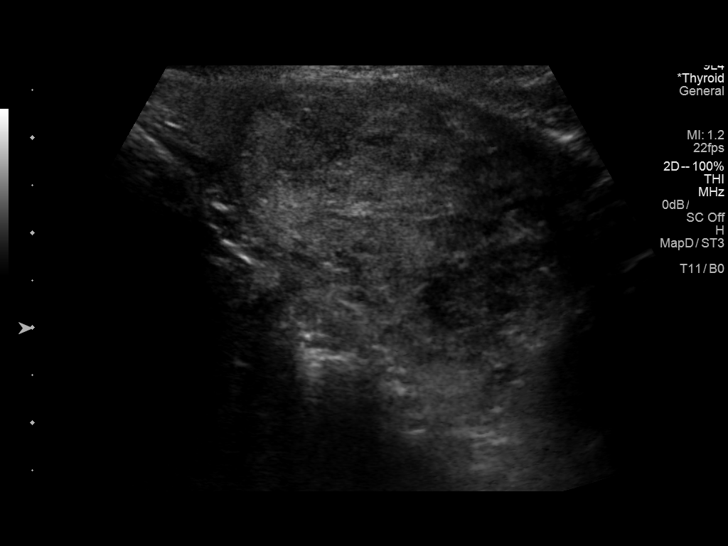
[im 8/48]
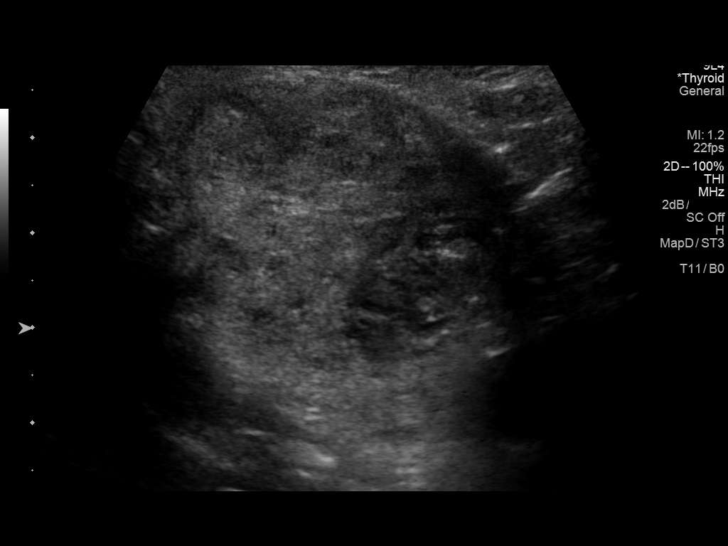
[im 12/48]
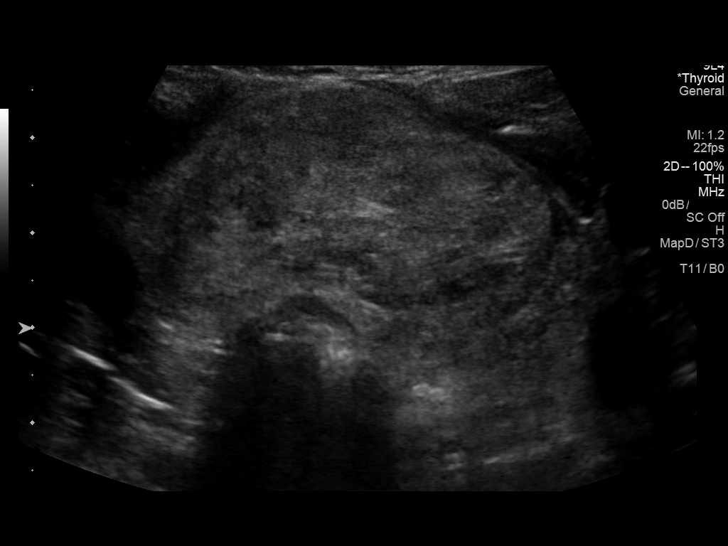
[im 16/48]
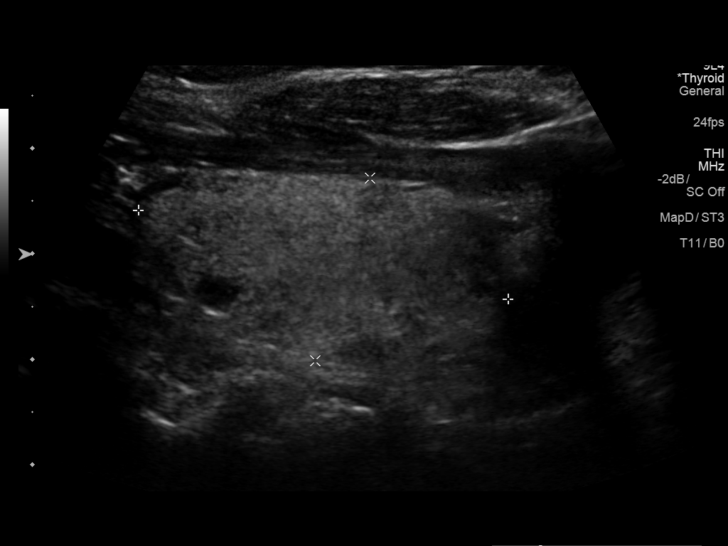
[im 20/48]
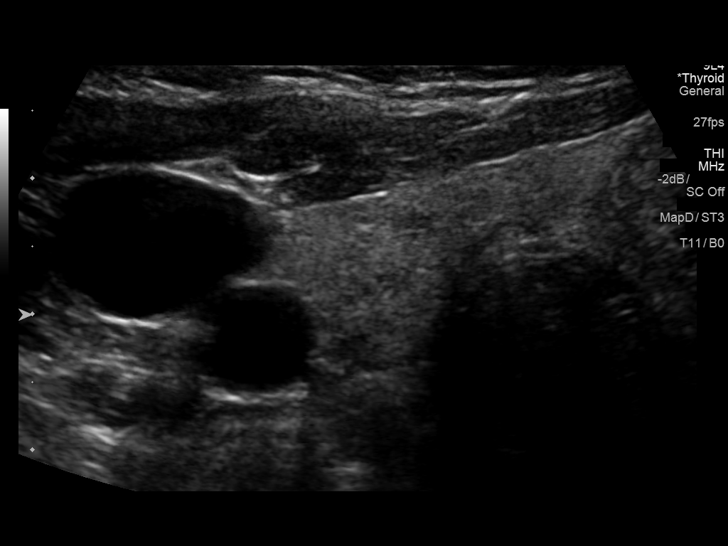
[im 24/48]
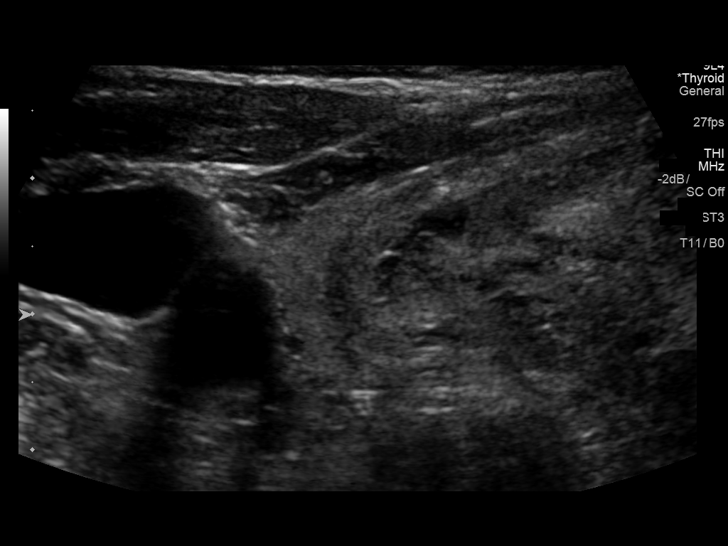
[im 28/48]
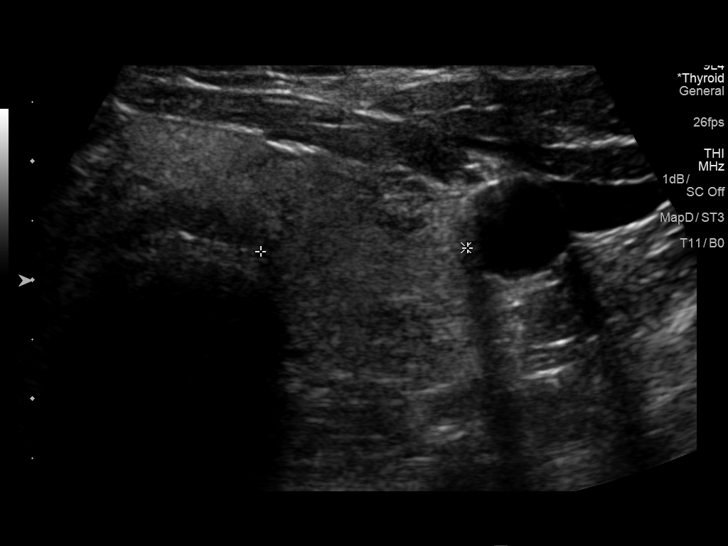
[im 32/48]
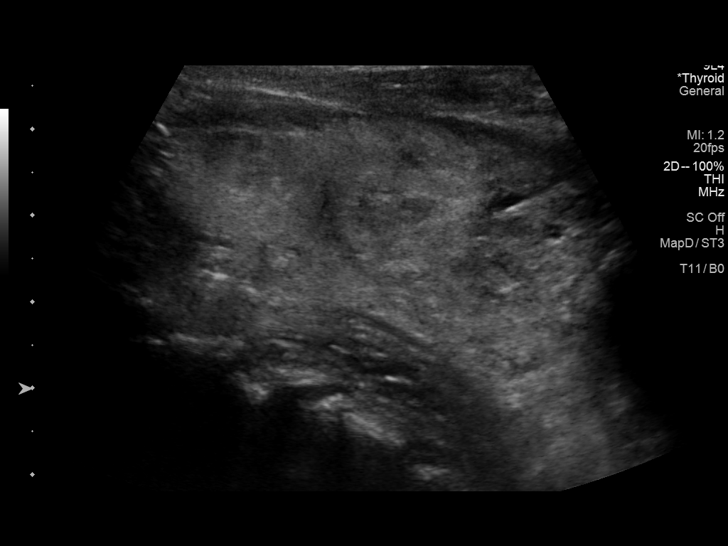
[im 36/48]
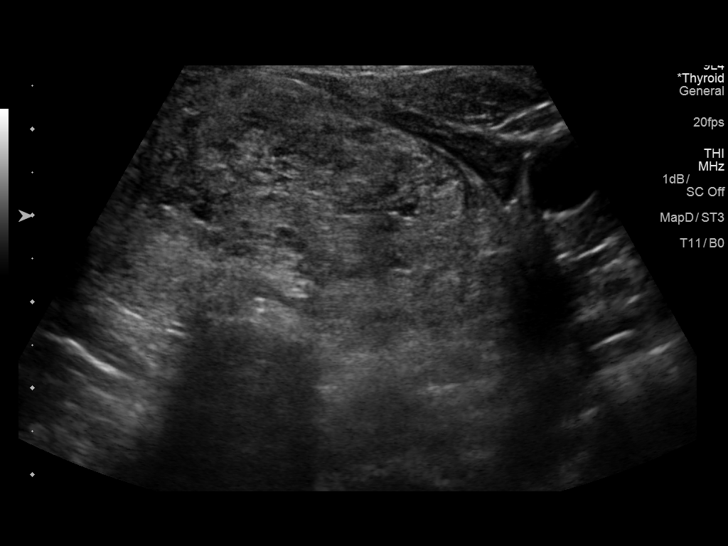
[im 40/48]
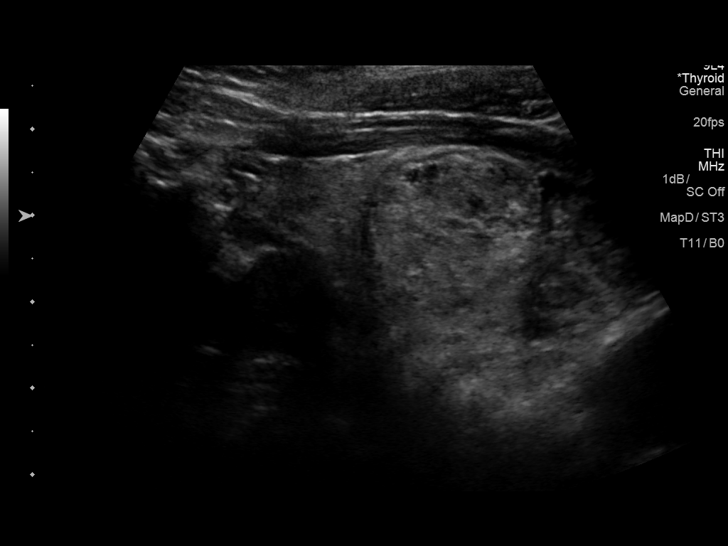
[im 44/48]
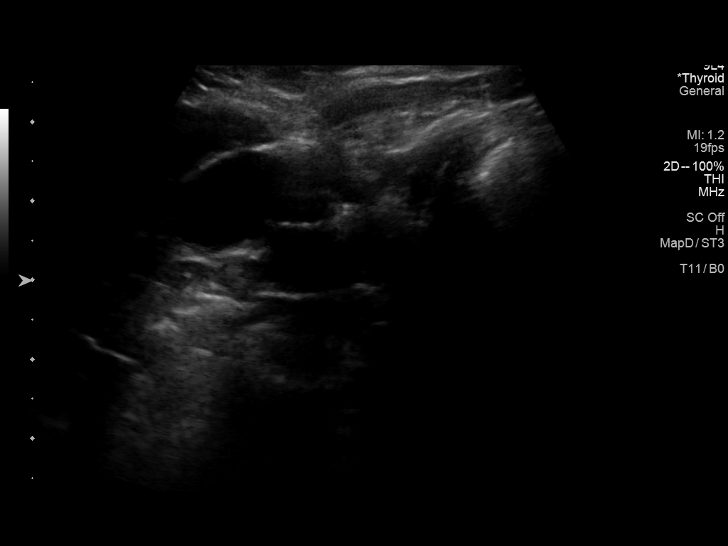
[im 48/48]
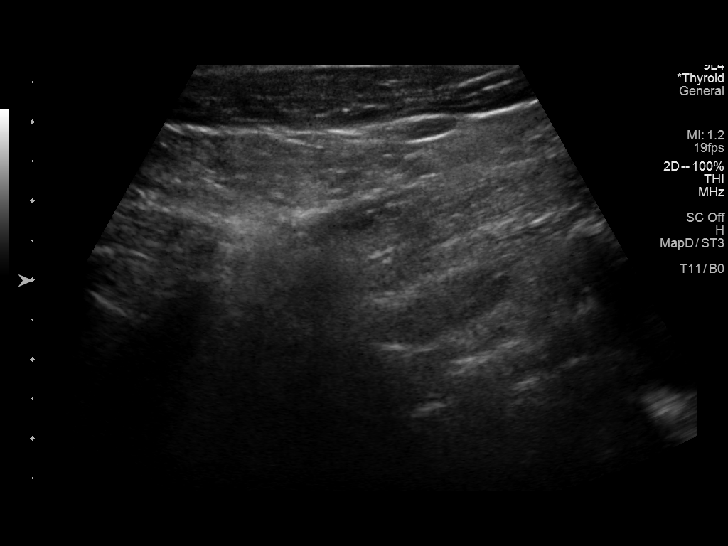

[13 of 25 positions shown; findings below may reference images not displayed]

FINDINGS: Parenchymal Echotexture: Markedly heterogenous

Isthmus: 1.5 cm

Right lobe: 3.6 cm x 1.8 cm x 1.7 cm

Left lobe: 5.8 cm x 2.5 cm x 1.7 cm

_________________________________________________________

Estimated total number of nodules >/= 1 cm: 2

Number of spongiform nodules >/=  2 cm not described below (TR1): 0

Number of mixed cystic and solid nodules >/= 1.5 cm not described
below (TR2): 0

_________________________________________________________

Nodule # 1:

Location: Isthmus; Inferior

Maximum size: 4.2 cm; Other 2 dimensions: 2.4 cm x 2.1 cm

Composition: solid/almost completely solid (2)

Echogenicity: isoechoic (1)

Shape: not taller-than-wide (0)

Margins: ill-defined (0)

Echogenic foci: none (0)

ACR TI-RADS total points: 3.

ACR TI-RADS risk category: TR3 (3 points).

ACR TI-RADS recommendations:

Nodule meets criteria for biopsy

_________________________________________________________

Nodule # 2:

Location: Isthmus; Inferior

Maximum size: 2.1 cm; Other 2 dimensions: 1.4 cm x 1.1 cm

Composition: mixed cystic and solid (1)

Echogenicity: isoechoic (1)

Shape: not taller-than-wide (0)

Margins: ill-defined (0)

Echogenic foci: none (0)

ACR TI-RADS total points: 2.

ACR TI-RADS risk category: TR2 (2 points).

ACR TI-RADS recommendations:

Nodule appears to be centered within the larger nodule of the
isthmus

_________________________________________________________

No adenopathy
IMPRESSION: Lower isthmic thyroid (labeled 1) nodule meets criteria for biopsy,
as designated by the newly established ACR TI-RADS criteria, and
referral for biopsy is recommended.

Recommendations follow those established by the new ACR TI-RADS
criteria ([HOSPITAL] 1069;[DATE]).

## 2019-01-09 DIAGNOSIS — I872 Venous insufficiency (chronic) (peripheral): Secondary | ICD-10-CM | POA: Diagnosis not present

## 2019-01-09 DIAGNOSIS — I10 Essential (primary) hypertension: Secondary | ICD-10-CM | POA: Diagnosis not present

## 2019-01-09 DIAGNOSIS — Z9181 History of falling: Secondary | ICD-10-CM | POA: Diagnosis not present

## 2019-01-09 DIAGNOSIS — M6281 Muscle weakness (generalized): Secondary | ICD-10-CM | POA: Diagnosis not present

## 2019-01-10 DIAGNOSIS — I10 Essential (primary) hypertension: Secondary | ICD-10-CM | POA: Diagnosis not present

## 2019-01-10 DIAGNOSIS — I872 Venous insufficiency (chronic) (peripheral): Secondary | ICD-10-CM | POA: Diagnosis not present

## 2019-01-10 DIAGNOSIS — Z9181 History of falling: Secondary | ICD-10-CM | POA: Diagnosis not present

## 2019-01-10 DIAGNOSIS — M6281 Muscle weakness (generalized): Secondary | ICD-10-CM | POA: Diagnosis not present

## 2019-01-11 DIAGNOSIS — I872 Venous insufficiency (chronic) (peripheral): Secondary | ICD-10-CM | POA: Diagnosis not present

## 2019-01-11 DIAGNOSIS — Z9181 History of falling: Secondary | ICD-10-CM | POA: Diagnosis not present

## 2019-01-11 DIAGNOSIS — I10 Essential (primary) hypertension: Secondary | ICD-10-CM | POA: Diagnosis not present

## 2019-01-11 DIAGNOSIS — M6281 Muscle weakness (generalized): Secondary | ICD-10-CM | POA: Diagnosis not present

## 2019-01-16 ENCOUNTER — Encounter: Payer: Self-pay | Admitting: Internal Medicine

## 2019-01-16 ENCOUNTER — Non-Acute Institutional Stay (SKILLED_NURSING_FACILITY): Payer: Medicare Other | Admitting: Internal Medicine

## 2019-01-16 DIAGNOSIS — I10 Essential (primary) hypertension: Secondary | ICD-10-CM | POA: Diagnosis not present

## 2019-01-16 DIAGNOSIS — G8929 Other chronic pain: Secondary | ICD-10-CM

## 2019-01-16 DIAGNOSIS — R635 Abnormal weight gain: Secondary | ICD-10-CM

## 2019-01-16 DIAGNOSIS — M25561 Pain in right knee: Secondary | ICD-10-CM

## 2019-01-16 DIAGNOSIS — E119 Type 2 diabetes mellitus without complications: Secondary | ICD-10-CM

## 2019-01-16 DIAGNOSIS — M25562 Pain in left knee: Secondary | ICD-10-CM | POA: Diagnosis not present

## 2019-01-16 NOTE — Progress Notes (Signed)
Location:    [facility] Room Number: 203/A Place of Service:  SNF (31) Provider:  Granville Lewis  No primary care provider on file.  No care team member to display  Extended Emergency Contact Information Primary Emergency Contact: Keyry, [place] Home Phone: [phone] Relation: Sister Secondary Emergency Contact: Sandria Manly Mobile Phone: [phone] Relation: Niece  Code Status:  Full Code Goals of care: Advanced Directive information Advanced Directives 01/16/2019  Does Patient Have a Medical Advance Directive? Yes  Type of Advance Directive (No Data)  Does patient want to make changes to medical advance directive? No - Patient declined  Would patient like information on creating a medical advance directive? -  Pre-existing out of facility DNR order (yellow form or pink MOST form) -     Chief Complaint  Patient presents with  . Acute Visit    Patients c/o Knee Pain    HPI:  Pt is a 68 y.o. female seen today for an acute visit for complaints of knee pain.  Long-term resident of facility with a history of osteoporosis in addition to GERD type 2 diabetes vitamin D deficiency and hypertension.  He has a history of chronic knee pain at one point had an order for Tylenol as needed we made this routine at night so overnight she said this did help some- also has orders for PRN Tylenol every 6 hours but according to her-she at times has difficulty receiving it.  She also has an order for topical Biofreeze twice a day and Naprosyn twice daily as needed we have been trying to avoid using Naprosyn frequently.  Otherwise she has no complaints today she was seen at one point for weight gain but this appears stabilized at around 192 pounds her venous stasis edema appears to be baseline she is on Lasix 40 mg a day- continues to deny any increased shortness of breath or chest pain.  Hypertension appears well controlled as well on lisinopril  Lasix and Norvasc recent blood pressures 124/60- 118/74--116/73.  In regards to type 2 diabetes she is on Glucophage and this appears stable with blood sugars largely in the lower to mid 100s hemoglobin A1c was 5.9 back in March     Past Medical History:  Diagnosis Date  . Allergic rhinitis   . Arthritis    "ankles, feet, knees" (10/30/2017)  . Asthma   . Bursitis   . Carpal tunnel syndrome   . GERD (gastroesophageal reflux disease)   . HTN (hypertension)   . Migraine    "a couple/yr" (10/30/2017)  . Obesity   . Peripheral vascular disease ([place])   . Pneumonia 1960s X 1  . Sciatica   . Tendinitis   . Type II diabetes mellitus (HCC)    Type II  . UTI (urinary tract infection)    Past Surgical History:  Procedure Laterality Date  . CRANIOTOMY N/A 01/19/2018   Procedure: ENDOSCOPIC TRANSPHENOIDAL RESECTION OF PITUITARY TUMOR;  Surgeon: Consuella Lose, MD;  Location: [facility];  Service: Neurosurgery;  Laterality: N/A;  ENDOSCOPIC TRANSPHENOIDAL RESECTION OF PITUITARY TUMOR  . GANGLION CYST EXCISION Left   . KNEE ARTHROSCOPY Left 2003  . PLANTAR FASCIA RELEASE Right 1995   "heel"  . SINUS ENDO WITH FUSION N/A 01/19/2018   Procedure: SINUS ENDO WITH FUSION;  Surgeon: Jerrell Belfast, MD;  Location: [facility];  Service: ENT;  Laterality: N/A;  . TONSILLECTOMY  1958  . TRANSPHENOIDAL APPROACH EXPOSURE N/A 01/19/2018   Procedure:  TRANSPHENOIDAL  RESECTION OF PITUITARY TUMOR;  Surgeon: Jerrell Belfast, MD;  Location: [facility];  Service: ENT;  Laterality: N/A;  . [place]; 1997   Dr [name]    Allergies  Allergen Reactions  . Penicillins Hives and Other (See Comments)    ++ got keflex in 2013 and 2019++ Has patient had a PCN reaction causing immediate rash, facial/tongue/throat swelling, SOB or lightheadedness with hypotension: No Has patient had a PCN reaction causing severe rash involving mucus membranes or skin necrosis: No PATIENT HAS HAD A PCN REACTION THAT REQUIRED  HOSPITALIZATION:  #  #  YES  #  #  Has patient had a PCN reaction occurring within the last 10 years: No   . Codeine Nausea And Vomiting  . Indomethacin Diarrhea    Outpatient Encounter Medications as of 01/16/2019  Medication Sig  . acetaminophen (TYLENOL) 650 MG CR tablet Take 650 mg by mouth at bedtime.   [name] acetaminophen (TYLENOL) 650 MG CR tablet Take 650 mg by mouth every 6 (six) hours as needed for pain.  [name] ADVAIR DISKUS 100-50 MCG/DOSE AEPB Inhale 1 puff into the lungs 2 (two) times daily.  [name] albuterol (PROVENTIL HFA;VENTOLIN HFA) 108 (90 Base) MCG/ACT inhaler Inhale 2 puffs into the lungs every 6 (six) hours as needed for wheezing or shortness of breath.  . AMLODIPINE BESYLATE PO 10 mg. TAKE 1 TABLET BY MOUTH ONCE DAILY FOR HTN  . bisacodyl (DULCOLAX) 10 MG suppository Constipation (2 of 4): If not relieved by MOM, give 10 mg Bisacodyl suppositiory rectally X 1 dose in 24 hours as needed (Do not use constipation standing orders for residents with renal failure/CFR less than 30. Contact MD for orders)  . Cholecalciferol (VITAMIN D3) 125 MCG (5000 UT) CAPS GIVE 1 CAPSULE BY MOUTH ONCE DAILY  . esomeprazole (NEXIUM) 20 MG capsule Take 1 capsule (20 mg total) by mouth every morning. For acid reflex  . furosemide (LASIX) 40 MG tablet TAKE ONE TABLET BY MOUTH EVERY DAY  . levETIRAcetam (KEPPRA) 500 MG tablet Take 1 tablet (500 mg total) by mouth 2 (two) times daily.  [name] lisinopril (PRINIVIL,ZESTRIL) 40 MG tablet Take 40 mg by mouth daily.   . magnesium hydroxide (MILK OF MAGNESIA) 400 MG/5ML suspension Constipation (1 of 4): If no BM in 3 days, give 30 cc Milk of Magnesium p.o. x 1 dose in 24 hours as needed (Do not use standing constipation orders for residents with renal failure CFR less than 30. Contact MD for orders)  . Menthol, Topical Analgesic, (BIOFREEZE) 4 % GEL APPLY TOPICALLY TO BILATERAL KNEES TWICE DAILY FOR OSTEOARTHRITIS  . metFORMIN (GLUCOPHAGE) 500 MG tablet Take 500 mg by  mouth 2 (two) times daily with a meal.  . mometasone (NASONEX) 50 MCG/ACT nasal spray Place 1 spray into the nose as needed.  . naproxen (NAPROSYN) 500 MG tablet Take 500 mg by mouth 2 (two) times daily as needed for moderate pain (Joint pain). Take after meals  . Nutritional Supplements (NUTRITIONAL SUPPLEMENT PO) Take 1 each by mouth daily. Magic Cup   No facility-administered encounter medications on file as of 01/16/2019.     Review of Systems   In general she is not complaining of any fever chills her weight appears to have stabilized.  Skin does not complain of rashes or itching.  Head ears eyes nose mouth and throat is not complaining of visual changes or sore throat.  Respiratory does not complain of shortness of breath or  cough.  She does have a an order Advair as needed  Cardiac does not complain of chest pain or palpitations she does have chronic venous stasis edema.  GI is not complaining of abdominal pain nausea vomiting diarrhea or constipation.  GU does not complain of dysuria.  She does have some history apparently of vaginal bleeding and she has a GYN consult pending which is been arranged for later this month it appears I believe this will be a tele-visit  Musculoskeletal again positive for chronic knee pain she says the Tylenol does help but apparently does not feel she probably gets it enough on the PRN basis  Neurologic does not complain of dizziness headache or numbness.  Psych does not complain of overt anxiety or depression.      Immunization History  Administered Date(s) Administered  . Influenza Split 03/08/2013  . Influenza Whole 09/03/2009, 06/03/2010  . Influenza,inj,Quad PF,6+ Mos 06/25/2014  . Influenza-Unspecified 06/08/2017  . Pneumococcal Polysaccharide-23 07/25/2002, 10/31/2017  . Td 07/25/2005   Pertinent  Health Maintenance Due  Topic Date Due  . MAMMOGRAM  01/18/2019 (Originally 01/14/2017)  . OPHTHALMOLOGY EXAM  01/19/2019  (Originally 10/11/2018)  . DEXA SCAN  01/19/2019 (Originally 09/02/2017)  . COLONOSCOPY  01/19/2019 (Originally 09/02/2002)  . PNA vac Low Risk Adult (2 of 2 - PCV13) 01/19/2019 (Originally 11/01/2018)  . HEMOGLOBIN A1C  01/26/2019 (Originally 12/05/2018)  . INFLUENZA VACCINE  02/23/2019  . FOOT EXAM  10/09/2019   Fall Risk  03/06/2018 10/20/2017 09/01/2017 08/15/2017 07/28/2017  Falls in the past year? Yes Yes Yes Yes Yes  Number falls in past yr: 1 2 or more 2 or more 1 2 or more  Comment - - 7 times  - -  Injury with Fall? Yes Yes No No Yes  Comment - Broken jaw - - -  Risk Factor Category  High Fall Risk High Fall Risk High Fall Risk High Fall Risk High Fall Risk  Risk for fall due to : Impaired balance/gait History of fall(s);Impaired mobility;Impaired balance/gait Impaired balance/gait;Impaired mobility Impaired balance/gait;Impaired mobility History of fall(s);Medication side effect;Impaired mobility  Follow up Falls prevention discussed - Falls prevention discussed Education provided Falls prevention discussed   Functional Status Survey:    Vitals:   01/16/19 1430  BP: 124/60  Pulse: 60  Resp: 18  Temp: (!) 97.5 F (36.4 C)  TempSrc: Oral  SpO2: 95%  Weight: 192 lb 3.2 oz (87.2 kg)  Height: 5\' 1"  (1.549 m)   Body mass index is 36.32 kg/m. Physical Exam   In general this is a pleasant female in no distress sitting comfortably as usual in her wheelchair.  Her skin is warm and dry.  She does have venous stasis changes of lower extremities with hyperpigmentation which is baseline.  Eyes visual acuity appears grossly intact sclera and conjunctive are clear.  Oropharynx is clear mucous membranes moist.  Chest does have some scattered diffuse rhonchi at baseline there is no labored breathing.  Heart is regular rate and rhythm without murmur gallop or rub she has baseline lower extremity edema this is fairly cool to touch with venous stasis changes.  Musculoskeletal does move all  extremities x4 at baseline with baseline lower extremity weakness she does have flexion and extension at the knees she says it is not hurting now because apparently she got Tylenol.  Continues to have baseline edema- it was limited exam because she did have somewhat tight pants on it was difficult to fully visualize her knees from what  I was able to visualize did not see increased erythema or warmth  Neurologic is grossly intact speech is clear could not really appreciate lateralizing findings.  Psych she continues to be pleasant and appropriate.  Did not have difficulty understanding what I was discussing  Labs reviewed:  September 28, 2018.  WBC 13.6 hemoglobin 10.7 platelets 412.  Sodium 138 potassium 4.3 BUN 16.1 creatinine 1.24.  Hemoglobin A1c 5.9 Recent Labs    06/02/18 0356 06/03/18 0548 06/04/18 0501 06/07/18 07/06/18 09/06/18  NA 137 136 136 140 142 138  K 3.4* 3.7 3.6 4.3 4.4 4.3  CL 106 104 104  --   --   --   CO2 23 25 25   --   --   --   GLUCOSE 104* 137* 126*  --   --   --   BUN 12 13 17  25* 21 16  CREATININE 1.05* 1.09* 0.91 0.8 1.1 1.2*  CALCIUM 8.1* 8.5* 8.6*  --   --   --    Recent Labs    04/25/18 0635 05/31/18 1401  AST 18 34  ALT 13 17  ALKPHOS 67 62  BILITOT 0.4 0.6  PROT 9.0* 9.1*  ALBUMIN 3.2* 3.4*   Recent Labs    06/01/18 0035 06/03/18 0548 06/04/18 0501 06/07/18 09/06/18 09/28/18  WBC 12.9* 13.5* 13.7* 16.9 14.5 13.6  NEUTROABS 9.6*  --   --  14 11 10   HGB 12.0 10.5* 11.3* 12.6 10.7* 10.7*  HCT 37.9 34.1* 35.8* 38 32* 32*  MCV 90.2 90.2 89.5  --   --   --   PLT 395 424* 415* 433* 422* 412*   Lab Results  Component Value Date   TSH 1.89 10/02/2018   Lab Results  Component Value Date   HGBA1C 5.9 09/06/2018   Lab Results  Component Value Date   CHOL 178 08/30/2012   HDL 49 08/30/2012   LDLCALC 92 08/30/2012   TRIG 187 (H) 08/30/2012   CHOLHDL 3.6 08/30/2012    Significant Diagnostic Results in last 30 days:  No results found.   Assessment/Plan #1 chronic knee pain- will make Tylenol scheduled once during the day as well as continue at night and continue every 6 hours as needed no more than 3 g of Tylenol total in 24-hour..  At this point will monitor- she says she will let us know if not effective-but at this point would only want the Tylenol twice routinely.  She continues on Biofreeze topical and she does have Naprosyn for more severe pain   #2- history of hypertension as stated above this appears to be under decent control on Lasix 40 mg a day lisinopril 40 mg a day and Norvasc 10 mg a day  #3-history of type 2 diabetes she continues on Glucophage --appears stable as noted above hemoglobin A1c was 5.9 in March- blood sugars are largely in the lower to mid 100s.  4.  Weight gain-this appears to have moderated stable recently at 192 pounds- this point monitor she does not complain of any increased edema or shortness of breath physical exam was relatively baseline  [id number]  Granville Lewis PA-C [phone]

## 2019-01-21 ENCOUNTER — Non-Acute Institutional Stay (SKILLED_NURSING_FACILITY): Payer: Medicare Other | Admitting: Internal Medicine

## 2019-01-21 ENCOUNTER — Encounter: Payer: Self-pay | Admitting: Internal Medicine

## 2019-01-21 DIAGNOSIS — I10 Essential (primary) hypertension: Secondary | ICD-10-CM

## 2019-01-21 DIAGNOSIS — E119 Type 2 diabetes mellitus without complications: Secondary | ICD-10-CM

## 2019-01-21 DIAGNOSIS — I872 Venous insufficiency (chronic) (peripheral): Secondary | ICD-10-CM | POA: Diagnosis not present

## 2019-01-21 DIAGNOSIS — K760 Fatty (change of) liver, not elsewhere classified: Secondary | ICD-10-CM

## 2019-01-21 NOTE — Assessment & Plan Note (Signed)
Glucose ranges 85-148 suggesting excellent control.  A1c will be updated once the [employer] is lifted.

## 2019-01-21 NOTE — Assessment & Plan Note (Addendum)
AHT lists both scheduled & prn Tylenol with the potential for 3900 mg a day.  The maintenance Tylenol will be discontinued and Tylenol every 6 hours continued as needed.

## 2019-01-21 NOTE — Assessment & Plan Note (Signed)
Amlodipine may be contributing to the persistent edema.  See notes under hypertension.

## 2019-01-21 NOTE — Assessment & Plan Note (Signed)
Blood pressure is controlled but the amlodipine at 10 mg may be contributing to the significant edema.  Amlodipine will be discontinued and metoprolol initiated as a trial.

## 2019-01-21 NOTE — Patient Instructions (Signed)
See assessment and plan under each diagnosis in the problem list and acutely for this visit 
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
## 2019-01-28 ENCOUNTER — Other Ambulatory Visit: Payer: Self-pay

## 2019-01-28 ENCOUNTER — Other Ambulatory Visit: Payer: Self-pay | Admitting: Obstetrics & Gynecology

## 2019-01-28 ENCOUNTER — Ambulatory Visit: Payer: Medicare Other | Admitting: Obstetrics & Gynecology

## 2019-01-28 DIAGNOSIS — N950 Postmenopausal bleeding: Secondary | ICD-10-CM

## 2019-01-28 NOTE — Progress Notes (Signed)
1430::Called pt regarding her appointment.  Pt did not pick up.  Left message with facility staff for her to contact the clinic and advise her that she will be contacted again in 15 minutes.   1445::Called pt regarding her appointment.  Pt stated that she did not know she had an appointment and wanted to cancel it.  Per Dr. Hulan Fray, pt needs an ultrasound appointment. Ultrasound scheduled for 02/11/19, pt to arrive at 1345. Pt verbalized understanding.

## 2019-01-28 NOTE — Progress Notes (Signed)
This lady declines to come in today. She gives a history of some ill-defined post menopausal bleeding in the last 6-8 months, none currently. I have rec'd and ordered a gyn u/s to be followed by an in person visit. I stressed the importance of checking on PMB.

## 2019-02-01 ENCOUNTER — Non-Acute Institutional Stay (SKILLED_NURSING_FACILITY): Payer: Medicare Other | Admitting: Adult Health

## 2019-02-01 ENCOUNTER — Encounter: Payer: Self-pay | Admitting: Adult Health

## 2019-02-01 DIAGNOSIS — E119 Type 2 diabetes mellitus without complications: Secondary | ICD-10-CM

## 2019-02-01 NOTE — Progress Notes (Signed)
Location:  [facility] Room Number: 203/A Place of Service:  SNF (31) Provider:  Durenda Age, DNP, FNP-BC  Patient Care Team: Earlene Plater, MD as PCP - General  Extended Emergency Contact Information Primary Emergency Contact: Marysol, [place] Home Phone: [phone] Relation: Sister Secondary Emergency Contact: Sandria Manly Mobile Phone: [phone] Relation: Niece  Code Status:  Full Code  Goals of care: Advanced Directive information Advanced Directives 02/01/2019  Does Patient Have a Medical Advance Directive? Yes  Type of Advance Directive (No Data)  Does patient want to make changes to medical advance directive? No - Patient declined  Would patient like information on creating a medical advance directive? -  Pre-existing out of facility DNR order (yellow form or pink MOST form) -     Chief Complaint  Patient presents with  . Acute Visit    Diabetic Management    HPI:  Pt is a 68 y.o. female seen today for diabetic management. Latest hgbA1c 5.9. CBGs are taken daily. She is currently on Metformin 500 mg BID for diabetes mellitus. She was seen in her room today. She denies any pain. She has PMH of CAD, diabetes mellitus, carpal tunnel syndrome and asthma.   Past Medical History:  Diagnosis Date  . Allergic rhinitis   . Arthritis    "ankles, feet, knees" (10/30/2017)  . Asthma   . Bursitis   . Carpal tunnel syndrome   . GERD (gastroesophageal reflux disease)   . HTN (hypertension)   . Migraine    "a couple/yr" (10/30/2017)  . Obesity   . Peripheral vascular disease ([place])   . Pneumonia 1960s X 1  . Sciatica   . Tendinitis   . Type II diabetes mellitus (HCC)    Type II  . UTI (urinary tract infection)    Past Surgical History:  Procedure Laterality Date  . CRANIOTOMY N/A 01/19/2018   Procedure: ENDOSCOPIC TRANSPHENOIDAL RESECTION OF PITUITARY TUMOR;  Surgeon: Consuella Lose, MD;  Location: [facility];  Service:  Neurosurgery;  Laterality: N/A;  ENDOSCOPIC TRANSPHENOIDAL RESECTION OF PITUITARY TUMOR  . GANGLION CYST EXCISION Left   . KNEE ARTHROSCOPY Left 2003  . PLANTAR FASCIA RELEASE Right 1995   "heel"  . SINUS ENDO WITH FUSION N/A 01/19/2018   Procedure: SINUS ENDO WITH FUSION;  Surgeon: Jerrell Belfast, MD;  Location: [facility];  Service: ENT;  Laterality: N/A;  . TONSILLECTOMY  1958  . TRANSPHENOIDAL APPROACH EXPOSURE N/A 01/19/2018   Procedure: TRANSPHENOIDAL  RESECTION OF PITUITARY TUMOR;  Surgeon: Jerrell Belfast, MD;  Location: [facility];  Service: ENT;  Laterality: N/A;  . [place]; 1997   Dr [name]    Allergies  Allergen Reactions  . Penicillins Hives and Other (See Comments)    ++ got keflex in 2013 and 2019++ Has patient had a PCN reaction causing immediate rash, facial/tongue/throat swelling, SOB or lightheadedness with hypotension: No Has patient had a PCN reaction causing severe rash involving mucus membranes or skin necrosis: No PATIENT HAS HAD A PCN REACTION THAT REQUIRED HOSPITALIZATION:  #  #  YES  #  #  Has patient had a PCN reaction occurring within the last 10 years: No   . Codeine Nausea And Vomiting  . Indomethacin Diarrhea    Outpatient Encounter Medications as of 02/01/2019  Medication Sig  . acetaminophen (TYLENOL) 650 MG CR tablet Take 650 mg by mouth daily.   [name] acetaminophen (TYLENOL) 650 MG CR tablet Take 650 mg by mouth every 6 (  six) hours as needed for pain.  [name] ADVAIR DISKUS 100-50 MCG/DOSE AEPB Inhale 1 puff into the lungs 2 (two) times daily.  [name] albuterol (PROVENTIL HFA;VENTOLIN HFA) 108 (90 Base) MCG/ACT inhaler Inhale 2 puffs into the lungs every 6 (six) hours as needed for wheezing or shortness of breath.  . bisacodyl (DULCOLAX) 10 MG suppository Constipation (2 of 4): If not relieved by MOM, give 10 mg Bisacodyl suppositiory rectally X 1 dose in 24 hours as needed (Do not use constipation standing orders for residents with renal failure/CFR  less than 30. Contact MD for orders)  . Carboxymeth-Glycerin-Polysorb (REFRESH OPTIVE ADVANCED OP) Apply 1 drop to eye 4 (four) times daily. OU  . Cholecalciferol (VITAMIN D3) 125 MCG (5000 UT) CAPS GIVE 1 CAPSULE BY MOUTH ONCE DAILY  . esomeprazole (NEXIUM) 20 MG capsule Take 1 capsule (20 mg total) by mouth every morning. For acid reflex  . furosemide (LASIX) 40 MG tablet TAKE ONE TABLET BY MOUTH EVERY DAY  . levETIRAcetam (KEPPRA) 500 MG tablet Take 1 tablet (500 mg total) by mouth 2 (two) times daily.  [name] lisinopril (PRINIVIL,ZESTRIL) 40 MG tablet Take 40 mg by mouth daily.   . magnesium hydroxide (MILK OF MAGNESIA) 400 MG/5ML suspension Constipation (1 of 4): If no BM in 3 days, give 30 cc Milk of Magnesium p.o. x 1 dose in 24 hours as needed (Do not use standing constipation orders for residents with renal failure CFR less than 30. Contact MD for orders)  . Menthol, Topical Analgesic, (BIOFREEZE) 4 % GEL APPLY TOPICALLY TO BILATERAL KNEES TWICE DAILY FOR OSTEOARTHRITIS  . metFORMIN (GLUCOPHAGE) 500 MG tablet Take 500 mg by mouth 2 (two) times daily with a meal.  . metoprolol succinate (TOPROL-XL) 25 MG 24 hr tablet Take 25 mg by mouth 2 (two) times daily.  . mometasone (NASONEX) 50 MCG/ACT nasal spray Place 1 spray into the nose as needed.  . naproxen (NAPROSYN) 500 MG tablet Take 500 mg by mouth 2 (two) times daily as needed for moderate pain (Joint pain). Take after meals  . [DISCONTINUED] AMLODIPINE BESYLATE PO 10 mg. TAKE 1 TABLET BY MOUTH ONCE DAILY FOR HTN   No facility-administered encounter medications on file as of 02/01/2019.     Review of Systems  GENERAL: No change in appetite, no fatigue, no weight changes, no fever, chills or weakness MOUTH and THROAT: Denies oral discomfort, gingival pain or bleeding RESPIRATORY: no cough, SOB, DOE, wheezing, hemoptysis CARDIAC: No chest pain, edema or palpitations GI: No abdominal pain, diarrhea, constipation, heart burn, nausea or  vomiting GU: Denies dysuria, frequency, hematuria, incontinence, or discharge NEUROLOGICAL: Denies dizziness, syncope, numbness, or headache PSYCHIATRIC: Denies feelings of depression or anxiety. No report of hallucinations, insomnia, paranoia, or agitation .   Immunization History  Administered Date(s) Administered  . Influenza Split 03/08/2013  . Influenza Whole 09/03/2009, 06/03/2010  . Influenza,inj,Quad PF,6+ Mos 06/25/2014  . Influenza-Unspecified 06/08/2017, 03/25/2018  . Pneumococcal Polysaccharide-23 07/25/2002, 10/31/2017  . Td 07/25/2005   Pertinent  Health Maintenance Due  Topic Date Due  . MAMMOGRAM  03/04/2019 (Originally 01/14/2017)  . HEMOGLOBIN A1C  03/04/2019 (Originally 12/05/2018)  . OPHTHALMOLOGY EXAM  03/04/2019 (Originally 10/11/2018)  . DEXA SCAN  03/04/2019 (Originally 09/02/2017)  . COLONOSCOPY  03/04/2019 (Originally 09/02/2002)  . PNA vac Low Risk Adult (2 of 2 - PCV13) 03/04/2019 (Originally 11/01/2018)  . INFLUENZA VACCINE  02/23/2019  . FOOT EXAM  10/09/2019   Fall Risk  03/06/2018 10/20/2017 09/01/2017 08/15/2017 07/28/2017  Falls  in the past year? Yes Yes Yes Yes Yes  Number falls in past yr: 1 2 or more 2 or more 1 2 or more  Comment - - 7 times  - -  Injury with Fall? Yes Yes No No Yes  Comment - Broken jaw - - -  Risk Factor Category  High Fall Risk High Fall Risk High Fall Risk High Fall Risk High Fall Risk  Risk for fall due to : Impaired balance/gait History of fall(s);Impaired mobility;Impaired balance/gait Impaired balance/gait;Impaired mobility Impaired balance/gait;Impaired mobility History of fall(s);Medication side effect;Impaired mobility  Follow up Falls prevention discussed - Falls prevention discussed Education provided Falls prevention discussed     Vitals:   02/01/19 1048  BP: (!) 141/74  Pulse: 82  Resp: 20  Temp: 98.3 F (36.8 C)  TempSrc: Oral  SpO2: 97%  Weight: 182 lb (82.6 kg)  Height: 5\' 1"  (1.549 m)   Body mass index is  34.39 kg/m.  Physical Exam  GENERAL APPEARANCE: Well nourished. In no acute distress. Obese SKIN:  Skin is warm and dry.  MOUTH and THROAT: Lips are without lesions. Oral mucosa is moist and without lesions. Tongue is normal in shape, size, and color and without lesions RESPIRATORY: Breathing is even & unlabored, BS CTAB CARDIAC: RRR, no murmur,no extra heart sounds, BLE 1-2+ edema GI: Abdomen soft, normal BS, no masses, no tenderness EXTREMITIES significant:  Able to move X 4 extremities NEUROLOGICAL: There is no tremor. Speech is clear.Alert and oriented X 3.    PSYCHIATRIC: Affect and behavior are appropriate  Labs reviewed: Recent Labs    06/02/18 0356 06/03/18 0548 06/04/18 0501 06/07/18 07/06/18 09/06/18  NA 137 136 136 140 142 138  K 3.4* 3.7 3.6 4.3 4.4 4.3  CL 106 104 104  --   --   --   CO2 23 25 25   --   --   --   GLUCOSE 104* 137* 126*  --   --   --   BUN 12 13 17  25* 21 16  CREATININE 1.05* 1.09* 0.91 0.8 1.1 1.2*  CALCIUM 8.1* 8.5* 8.6*  --   --   --    Recent Labs    04/25/18 0635 05/31/18 1401  AST 18 34  ALT 13 17  ALKPHOS 67 62  BILITOT 0.4 0.6  PROT 9.0* 9.1*  ALBUMIN 3.2* 3.4*   Recent Labs    06/01/18 0035 06/03/18 0548 06/04/18 0501 06/07/18 09/06/18 09/28/18  WBC 12.9* 13.5* 13.7* 16.9 14.5 13.6  NEUTROABS 9.6*  --   --  14 11 10   HGB 12.0 10.5* 11.3* 12.6 10.7* 10.7*  HCT 37.9 34.1* 35.8* 38 32* 32*  MCV 90.2 90.2 89.5  --   --   --   PLT 395 424* 415* 433* 422* 412*   Lab Results  Component Value Date   TSH 1.89 10/02/2018   Lab Results  Component Value Date   HGBA1C 5.9 09/06/2018   Lab Results  Component Value Date   CHOL 178 08/30/2012   HDL 49 08/30/2012   LDLCALC 92 08/30/2012   TRIG 187 (H) 08/30/2012   CHOLHDL 3.6 08/30/2012    Significant Diagnostic Results in last 30 days:  No results found.  Assessment/Plan  1. Type 2 diabetes mellitus treated without insulin (HCC) Lab Results  Component Value Date    HGBA1C 5.9 09/06/2018  - will continue Metformin 500 mg BID and decrease CBG checks from daily to MWF    Family/  staff Communication: Discussed plan of care with resident.  Labs/tests ordered:  None  Goals of care:   Long-term care    Durenda Age, DNP, FNP-BC [facility] [phone] (Monday-Friday 8:00 a.m. - 5:00 p.m.) [phone] (after hours)

## 2019-02-05 ENCOUNTER — Non-Acute Institutional Stay (SKILLED_NURSING_FACILITY): Payer: Medicare Other | Admitting: Adult Health

## 2019-02-05 ENCOUNTER — Encounter: Payer: Self-pay | Admitting: Adult Health

## 2019-02-05 DIAGNOSIS — I10 Essential (primary) hypertension: Secondary | ICD-10-CM

## 2019-02-05 DIAGNOSIS — M170 Bilateral primary osteoarthritis of knee: Secondary | ICD-10-CM

## 2019-02-05 DIAGNOSIS — K219 Gastro-esophageal reflux disease without esophagitis: Secondary | ICD-10-CM

## 2019-02-05 DIAGNOSIS — E119 Type 2 diabetes mellitus without complications: Secondary | ICD-10-CM

## 2019-02-05 DIAGNOSIS — G40909 Epilepsy, unspecified, not intractable, without status epilepticus: Secondary | ICD-10-CM

## 2019-02-05 NOTE — Progress Notes (Signed)
Location:  [facility] Room Number: 230/A Place of Service:  SNF (31) Provider:  Durenda Age, DNP, FNP-BC  Patient Care Team: Earlene Plater, MD as PCP - General  Extended Emergency Contact Information Primary Emergency Contact: Tosca, [place] Home Phone: [phone] Relation: Sister Secondary Emergency Contact: Sandria Manly Mobile Phone: [phone] Relation: Niece  Code Status:  Full Code  Goals of care: Advanced Directive information Advanced Directives 02/05/2019  Does Patient Have a Medical Advance Directive? Yes  Type of Advance Directive (No Data)  Does patient want to make changes to medical advance directive? No - Patient declined  Would patient like information on creating a medical advance directive? -  Pre-existing out of facility DNR order (yellow form or pink MOST form) -     Chief Complaint  Patient presents with  . Medical Management of Chronic Issues    [place] visit of medical management, BS 134 mg/dl  . Immunizations    T-Dap, [id number]  . Quality Metric Gaps    Eye Exam, Hemoglobin A1-C, Mammogram, Colonoscopy  . Best Practice Recommendations    Dexa Scan, Hep-C Screening    HPI:  Pt is a 68 y.o. female seen today for medical management of chronic diseases.  She has PMH of CAD, diabetes mellitus, carpal tunnel syndrome and asthma. Latest hgbA1C done on 2/20 was 5.9 so CBG checks were decreased from daily to 3X/week. She attends restorative exercises - stretching, walking and dressing/grooming. Routine eye exam, mammogram, colonoscopy and dexa scan cannot be done at this time due to COVID-19 pandemic.    Past Medical History:  Diagnosis Date  . Allergic rhinitis   . Arthritis    "ankles, feet, knees" (10/30/2017)  . Asthma   . Bursitis   . Carpal tunnel syndrome   . GERD (gastroesophageal reflux disease)   . HTN (hypertension)   . Migraine    "a couple/yr" (10/30/2017)  . Obesity   . Peripheral vascular  disease ([place])   . Pneumonia 1960s X 1  . Sciatica   . Tendinitis   . Type II diabetes mellitus (HCC)    Type II  . UTI (urinary tract infection)    Past Surgical History:  Procedure Laterality Date  . CRANIOTOMY N/A 01/19/2018   Procedure: ENDOSCOPIC TRANSPHENOIDAL RESECTION OF PITUITARY TUMOR;  Surgeon: Consuella Lose, MD;  Location: [facility];  Service: Neurosurgery;  Laterality: N/A;  ENDOSCOPIC TRANSPHENOIDAL RESECTION OF PITUITARY TUMOR  . GANGLION CYST EXCISION Left   . KNEE ARTHROSCOPY Left 2003  . PLANTAR FASCIA RELEASE Right 1995   "heel"  . SINUS ENDO WITH FUSION N/A 01/19/2018   Procedure: SINUS ENDO WITH FUSION;  Surgeon: Jerrell Belfast, MD;  Location: [facility];  Service: ENT;  Laterality: N/A;  . TONSILLECTOMY  1958  . TRANSPHENOIDAL APPROACH EXPOSURE N/A 01/19/2018   Procedure: TRANSPHENOIDAL  RESECTION OF PITUITARY TUMOR;  Surgeon: Jerrell Belfast, MD;  Location: [facility];  Service: ENT;  Laterality: N/A;  . [place]; 1997   Dr [name]    Allergies  Allergen Reactions  . Penicillins Hives and Other (See Comments)    ++ got keflex in 2013 and 2019++ Has patient had a PCN reaction causing immediate rash, facial/tongue/throat swelling, SOB or lightheadedness with hypotension: No Has patient had a PCN reaction causing severe rash involving mucus membranes or skin necrosis: No PATIENT HAS HAD A PCN REACTION THAT REQUIRED HOSPITALIZATION:  #  #  YES  #  #  Has patient had a  PCN reaction occurring within the last 10 years: No   . Codeine Nausea And Vomiting  . Indomethacin Diarrhea    Outpatient Encounter Medications as of 02/05/2019  Medication Sig  . acetaminophen (TYLENOL) 650 MG CR tablet Take 650 mg by mouth daily.   [name] acetaminophen (TYLENOL) 650 MG CR tablet Take 650 mg by mouth every 6 (six) hours as needed for pain.  [name] ADVAIR DISKUS 100-50 MCG/DOSE AEPB Inhale 1 puff into the lungs 2 (two) times daily.  [name] albuterol (PROVENTIL HFA;VENTOLIN HFA) 108  (90 Base) MCG/ACT inhaler Inhale 2 puffs into the lungs every 6 (six) hours as needed for wheezing or shortness of breath.  . bisacodyl (DULCOLAX) 10 MG suppository Constipation (2 of 4): If not relieved by MOM, give 10 mg Bisacodyl suppositiory rectally X 1 dose in 24 hours as needed (Do not use constipation standing orders for residents with renal failure/CFR less than 30. Contact MD for orders)  . Carboxymeth-Glycerin-Polysorb (REFRESH OPTIVE ADVANCED OP) Apply 1 drop to eye 4 (four) times daily. OU  . Cholecalciferol (VITAMIN D3) 125 MCG (5000 UT) CAPS GIVE 1 CAPSULE BY MOUTH ONCE DAILY  . esomeprazole (NEXIUM) 20 MG capsule Take 1 capsule (20 mg total) by mouth every morning. For acid reflex  . furosemide (LASIX) 40 MG tablet TAKE ONE TABLET BY MOUTH EVERY DAY  . levETIRAcetam (KEPPRA) 500 MG tablet Take 1 tablet (500 mg total) by mouth 2 (two) times daily.  [name] lisinopril (PRINIVIL,ZESTRIL) 40 MG tablet Take 40 mg by mouth daily.   . magnesium hydroxide (MILK OF MAGNESIA) 400 MG/5ML suspension Constipation (1 of 4): If no BM in 3 days, give 30 cc Milk of Magnesium p.o. x 1 dose in 24 hours as needed (Do not use standing constipation orders for residents with renal failure CFR less than 30. Contact MD for orders)  . Menthol, Topical Analgesic, (BIOFREEZE) 4 % GEL APPLY TOPICALLY TO BILATERAL KNEES TWICE DAILY FOR OSTEOARTHRITIS  . metFORMIN (GLUCOPHAGE) 500 MG tablet Take 500 mg by mouth 2 (two) times daily with a meal.  . metoprolol succinate (TOPROL-XL) 25 MG 24 hr tablet Take 25 mg by mouth 2 (two) times daily.  . mometasone (NASONEX) 50 MCG/ACT nasal spray Place 1 spray into the nose as needed.  . naproxen (NAPROSYN) 500 MG tablet Take 500 mg by mouth 2 (two) times daily as needed for moderate pain (Joint pain). Take after meals   No facility-administered encounter medications on file as of 02/05/2019.     Review of Systems  GENERAL: No change in appetite, no fatigue, no fever, chills or  weakness MOUTH and THROAT: Denies oral discomfort, gingival pain or bleeding, pain from teeth or hoarseness   RESPIRATORY: no cough, SOB, DOE, wheezing, hemoptysis CARDIAC: No chest pain, or palpitations GI: No abdominal pain, diarrhea, constipation, heart burn, nausea or vomiting GU: Denies dysuria, frequency, hematuria, or discharge NEUROLOGICAL: Denies dizziness, syncope, numbness, or headache PSYCHIATRIC: Denies feelings of depression or anxiety. No report of hallucinations, insomnia, paranoia, or agitation    Immunization History  Administered Date(s) Administered  . Influenza Split 03/08/2013  . Influenza Whole 09/03/2009, 06/03/2010  . Influenza,inj,Quad PF,6+ Mos 06/25/2014  . Influenza-Unspecified 06/08/2017, 03/25/2018  . Pneumococcal Polysaccharide-23 07/25/2002, 10/31/2017  . Td 07/25/2005   Pertinent  Health Maintenance Due  Topic Date Due  . MAMMOGRAM  03/04/2019 (Originally 01/14/2017)  . HEMOGLOBIN A1C  03/04/2019 (Originally 12/05/2018)  . OPHTHALMOLOGY EXAM  03/04/2019 (Originally 10/11/2018)  . DEXA SCAN  03/04/2019 (Originally  09/02/2017)  . COLONOSCOPY  03/04/2019 (Originally 09/02/2002)  . PNA vac Low Risk Adult (2 of 2 - PCV13) 03/04/2019 (Originally 11/01/2018)  . INFLUENZA VACCINE  02/23/2019  . FOOT EXAM  10/09/2019   Fall Risk  03/06/2018 10/20/2017 09/01/2017 08/15/2017 07/28/2017  Falls in the past year? Yes Yes Yes Yes Yes  Number falls in past yr: 1 2 or more 2 or more 1 2 or more  Comment - - 7 times  - -  Injury with Fall? Yes Yes No No Yes  Comment - Broken jaw - - -  Risk Factor Category  High Fall Risk High Fall Risk High Fall Risk High Fall Risk High Fall Risk  Risk for fall due to : Impaired balance/gait History of fall(s);Impaired mobility;Impaired balance/gait Impaired balance/gait;Impaired mobility Impaired balance/gait;Impaired mobility History of fall(s);Medication side effect;Impaired mobility  Follow up Falls prevention discussed - Falls prevention  discussed Education provided Falls prevention discussed     Vitals:   02/05/19 0911  BP: (!) 149/87  Pulse: 67  Resp: 16  Temp: 98 F (36.7 C)  TempSrc: Oral  SpO2: 98%  Weight: 182 lb (82.6 kg)  Height: 5\' 1"  (1.549 m)   Body mass index is 34.39 kg/m.  Physical Exam  GENERAL APPEARANCE: Well nourished. In no acute distress. Obese SKIN:  Skin is warm and dry.  MOUTH and THROAT: Lips are without lesions. Oral mucosa is moist and without lesions. Tongue is normal in shape, size, and color and without lesions RESPIRATORY: Breathing is even & unlabored, BS CTAB CARDIAC: RRR, no murmur,no extra heart sounds, no edema GI: Abdomen soft, normal BS, no masses, no tenderness EXTREMITIES:  Able to move X 4 extremities NEUROLOGICAL: There is no tremor. Speech is clear. Alert to self, disoriented to time and place. PSYCHIATRIC:  Affect and behavior are appropriate  Labs reviewed: Recent Labs    06/02/18 0356 06/03/18 0548 06/04/18 0501 06/07/18 07/06/18 09/06/18  NA 137 136 136 140 142 138  K 3.4* 3.7 3.6 4.3 4.4 4.3  CL 106 104 104  --   --   --   CO2 23 25 25   --   --   --   GLUCOSE 104* 137* 126*  --   --   --   BUN 12 13 17  25* 21 16  CREATININE 1.05* 1.09* 0.91 0.8 1.1 1.2*  CALCIUM 8.1* 8.5* 8.6*  --   --   --    Recent Labs    04/25/18 0635 05/31/18 1401  AST 18 34  ALT 13 17  ALKPHOS 67 62  BILITOT 0.4 0.6  PROT 9.0* 9.1*  ALBUMIN 3.2* 3.4*   Recent Labs    06/01/18 0035 06/03/18 0548 06/04/18 0501 06/07/18 09/06/18 09/28/18  WBC 12.9* 13.5* 13.7* 16.9 14.5 13.6  NEUTROABS 9.6*  --   --  14 11 10   HGB 12.0 10.5* 11.3* 12.6 10.7* 10.7*  HCT 37.9 34.1* 35.8* 38 32* 32*  MCV 90.2 90.2 89.5  --   --   --   PLT 395 424* 415* 433* 422* 412*   Lab Results  Component Value Date   TSH 1.89 10/02/2018   Lab Results  Component Value Date   HGBA1C 5.9 09/06/2018   Lab Results  Component Value Date   CHOL 178 08/30/2012   HDL 49 08/30/2012   LDLCALC 92  08/30/2012   TRIG 187 (H) 08/30/2012   CHOLHDL 3.6 08/30/2012    Significant Diagnostic Results in last 30 days:  No results found.  Assessment/Plan  1. Type 2 diabetes mellitus treated without insulin (HCC) Lab Results  Component Value Date   HGBA1C 5.9 09/06/2018  -Well-controlled, continue metformin 500 mg twice a day and recheck hemoglobin A1c   2. Essential hypertension -Well-controlled, continue metoprolol tartrate 25 mg 1 tab twice a day, Lasix 40 mg 1 tab daily and lisinopril 40 mg 1 tab daily  3. Primary osteoarthritis of both knees -Stable, continue Biofreeze 4% gel topically to bilateral knees twice daily, Naprosyn 500 mg twice a day as needed, Tylenol arthritis ER 650 mg 1 tablet every 6 hours as needed for pain  4.  Seizure disorder -No Recent seizures, Continue  Keppra 500 mg 1 tab twice a day  5. GERD - Stable, continue esomeprazole 20 mg 1 capsule daily      Family/ staff Communication: Discussed plan of care with resident.  Labs/tests ordered: Hemoglobin A1c and hepatitis C virus antibody  Goals of care:   Long-term care   Durenda Age, DNP, FNP-BC [facility] [phone] (Monday-Friday 8:00 a.m. - 5:00 p.m.) [phone] (after hours)

## 2019-02-06 DIAGNOSIS — N179 Acute kidney failure, unspecified: Secondary | ICD-10-CM | POA: Diagnosis not present

## 2019-02-06 DIAGNOSIS — E119 Type 2 diabetes mellitus without complications: Secondary | ICD-10-CM | POA: Diagnosis not present

## 2019-02-06 DIAGNOSIS — I872 Venous insufficiency (chronic) (peripheral): Secondary | ICD-10-CM | POA: Diagnosis not present

## 2019-02-06 DIAGNOSIS — E669 Obesity, unspecified: Secondary | ICD-10-CM | POA: Diagnosis not present

## 2019-02-06 LAB — HEMOGLOBIN A1C: Hemoglobin A1C: 5.8

## 2019-02-11 ENCOUNTER — Ambulatory Visit (HOSPITAL_COMMUNITY): Admission: RE | Admit: 2019-02-11 | Payer: Medicare Other | Source: Ambulatory Visit

## 2019-02-14 ENCOUNTER — Ambulatory Visit: Payer: Medicare Other | Admitting: Obstetrics & Gynecology

## 2019-02-14 ENCOUNTER — Other Ambulatory Visit: Payer: Self-pay

## 2019-02-14 NOTE — Progress Notes (Signed)
Called pt @ 1543 at both provided telephone numbers unable to LM due to one phone line was busy and the other the phone kept ringing. Will attempt to call back in 74min.    Called pt @ 1600 and was connected to a nursing facility.  I was informed that they did not know about the appt and so we will have to call them to reschedule the appt.  [place] office notified to call the facility to schedule an appt.

## 2019-02-15 DIAGNOSIS — B342 Coronavirus infection, unspecified: Secondary | ICD-10-CM | POA: Diagnosis not present

## 2019-02-21 ENCOUNTER — Other Ambulatory Visit: Payer: Self-pay

## 2019-02-27 ENCOUNTER — Encounter: Payer: Self-pay | Admitting: Adult Health

## 2019-02-27 ENCOUNTER — Non-Acute Institutional Stay (SKILLED_NURSING_FACILITY): Payer: Medicare Other | Admitting: Adult Health

## 2019-02-27 DIAGNOSIS — M170 Bilateral primary osteoarthritis of knee: Secondary | ICD-10-CM

## 2019-02-27 DIAGNOSIS — Z1211 Encounter for screening for malignant neoplasm of colon: Secondary | ICD-10-CM | POA: Diagnosis not present

## 2019-02-27 DIAGNOSIS — E119 Type 2 diabetes mellitus without complications: Secondary | ICD-10-CM

## 2019-02-27 DIAGNOSIS — G40909 Epilepsy, unspecified, not intractable, without status epilepticus: Secondary | ICD-10-CM

## 2019-02-27 NOTE — Progress Notes (Signed)
Location:  [place] Room Number: 203/A Place of Service:  SNF (31) Provider:  Durenda Age, DNP, FNP-BC  Patient Care Team: Hendricks Limes, MD as PCP - General (Internal Medicine) Medina-Vargas, Senaida Lange, NP as Nurse Practitioner (Internal Medicine)  Extended Emergency Contact Information Primary Emergency Contact: Vela, Render Home Phone: [phone] Relation: Sister Secondary Emergency Contact: Sandria Manly Mobile Phone: [phone] Relation: Niece  Code Status:  Full Code  Goals of care: Advanced Directive information Advanced Directives 02/27/2019  Does Patient Have a Medical Advance Directive? Yes  Type of Advance Directive (No Data)  Does patient want to make changes to medical advance directive? No - Patient declined  Would patient like information on creating a medical advance directive? -  Pre-existing out of facility DNR order (yellow form or pink MOST form) -     Chief Complaint  Patient presents with  . Medical Management of Chronic Issues    Routine Visit  . Quality Metric Gaps    stool occult blood    HPI:  Pt is a 68 y.o. female seen today for medical management of chronic diseases. He has PMH of CAD, diabetes mellitus, carpal tunnel syndrome and asthma. CBGs 117, 91, 135, 105, 96, 97, 110. She currently takes Metformin for diabetes mellitus. No recent seizures. He takes Keppra for seizure. She was seen standing infront of the bathroom. She said that it takes her time to move because her knees hurts, especially early in the morning.   Past Medical History:  Diagnosis Date  . Allergic rhinitis   . Arthritis    "ankles, feet, knees" (10/30/2017)  . Asthma   . Bursitis   . Carpal tunnel syndrome   . GERD (gastroesophageal reflux disease)   . HTN (hypertension)   . Migraine    "a couple/yr" (10/30/2017)  . Obesity   . Peripheral vascular disease ([place])   . Pneumonia 1960s X 1  . Sciatica   . Tendinitis   . Type  II diabetes mellitus (HCC)    Type II  . UTI (urinary tract infection)    Past Surgical History:  Procedure Laterality Date  . CRANIOTOMY N/A 01/19/2018   Procedure: ENDOSCOPIC TRANSPHENOIDAL RESECTION OF PITUITARY TUMOR;  Surgeon: Consuella Lose, MD;  Location: [place];  Service: Neurosurgery;  Laterality: N/A;  ENDOSCOPIC TRANSPHENOIDAL RESECTION OF PITUITARY TUMOR  . GANGLION CYST EXCISION Left   . KNEE ARTHROSCOPY Left 2003  . PLANTAR FASCIA RELEASE Right 1995   "heel"  . SINUS ENDO WITH FUSION N/A 01/19/2018   Procedure: SINUS ENDO WITH FUSION;  Surgeon: Jerrell Belfast, MD;  Location: [place];  Service: ENT;  Laterality: N/A;  . TONSILLECTOMY  1958  . TRANSPHENOIDAL APPROACH EXPOSURE N/A 01/19/2018   Procedure: TRANSPHENOIDAL  RESECTION OF PITUITARY TUMOR;  Surgeon: Jerrell Belfast, MD;  Location: [place];  Service: ENT;  Laterality: N/A;  . [place]; 1997   Dr [name]    Allergies  Allergen Reactions  . Penicillins Hives and Other (See Comments)    ++ got keflex in 2013 and 2019++ Has patient had a PCN reaction causing immediate rash, facial/tongue/throat swelling, SOB or lightheadedness with hypotension: No Has patient had a PCN reaction causing severe rash involving mucus membranes or skin necrosis: No PATIENT HAS HAD A PCN REACTION THAT REQUIRED HOSPITALIZATION:  #  #  YES  #  #  Has patient had a PCN reaction occurring within the last 10 years: No   . Codeine  Nausea And Vomiting  . Indomethacin Diarrhea    Outpatient Encounter Medications as of 02/27/2019  Medication Sig  . acetaminophen (TYLENOL) 650 MG CR tablet Take 650 mg by mouth every 6 (six) hours as needed for pain.  [name] ADVAIR DISKUS 100-50 MCG/DOSE AEPB Inhale 1 puff into the lungs 2 (two) times daily.  [name] albuterol (PROVENTIL HFA;VENTOLIN HFA) 108 (90 Base) MCG/ACT inhaler Inhale 2 puffs into the lungs every 6 (six) hours as needed for wheezing or shortness of breath.  . bisacodyl (DULCOLAX) 10  MG suppository Constipation (2 of 4): If not relieved by MOM, give 10 mg Bisacodyl suppositiory rectally X 1 dose in 24 hours as needed (Do not use constipation standing orders for residents with renal failure/CFR less than 30. Contact MD for orders)  . Carboxymeth-Glycerin-Polysorb (REFRESH OPTIVE ADVANCED OP) Apply 1 drop to eye 4 (four) times daily. OU  . Cholecalciferol (VITAMIN D3) 125 MCG (5000 UT) CAPS GIVE 1 CAPSULE BY MOUTH ONCE DAILY  . esomeprazole (NEXIUM) 20 MG capsule Take 1 capsule (20 mg total) by mouth every morning. For acid reflex  . furosemide (LASIX) 40 MG tablet TAKE ONE TABLET BY MOUTH EVERY DAY  . levETIRAcetam (KEPPRA) 500 MG tablet Take 1 tablet (500 mg total) by mouth 2 (two) times daily.  [name] lisinopril (PRINIVIL,ZESTRIL) 40 MG tablet Take 40 mg by mouth daily.   . magnesium hydroxide (MILK OF MAGNESIA) 400 MG/5ML suspension Constipation (1 of 4): If no BM in 3 days, give 30 cc Milk of Magnesium p.o. x 1 dose in 24 hours as needed (Do not use standing constipation orders for residents with renal failure CFR less than 30. Contact MD for orders)  . Menthol, Topical Analgesic, (BIOFREEZE) 4 % GEL APPLY TOPICALLY TO BILATERAL KNEES TWICE DAILY FOR OSTEOARTHRITIS  . metFORMIN (GLUCOPHAGE) 500 MG tablet Take 500 mg by mouth 2 (two) times daily with a meal.  . metoprolol succinate (TOPROL-XL) 25 MG 24 hr tablet Take 25 mg by mouth 2 (two) times daily.  . mometasone (NASONEX) 50 MCG/ACT nasal spray Place 1 spray into the nose as needed.  . naproxen (NAPROSYN) 500 MG tablet Take 500 mg by mouth 2 (two) times daily as needed for moderate pain (Joint pain). Take after meals  . [DISCONTINUED] acetaminophen (TYLENOL) 650 MG CR tablet Take 650 mg by mouth daily.    No facility-administered encounter medications on file as of 02/27/2019.     Review of Systems  GENERAL: No change in appetite, no fatigue, no weight changes, no fever, chills or weakness MOUTH and THROAT: Denies oral  discomfort, gingival pain or bleeding RESPIRATORY: no cough, SOB, DOE, wheezing, hemoptysis CARDIAC: No chest pain or palpitations GI: No abdominal pain, diarrhea, constipation, heart burn, nausea or vomiting GU: Denies dysuria, frequency, hematuria, incontinence, or discharge NEUROLOGICAL: Denies dizziness, syncope, numbness, or headache PSYCHIATRIC: Denies feelings of depression or anxiety. No report of hallucinations, insomnia, paranoia, or agitation    Immunization History  Administered Date(s) Administered  . Influenza Split 03/08/2013  . Influenza Whole 09/03/2009, 06/03/2010  . Influenza,inj,Quad PF,6+ Mos 06/25/2014  . Influenza-Unspecified 06/08/2017, 03/25/2018  . Pneumococcal Polysaccharide-23 07/25/2002, 10/31/2017  . Td 07/25/2005   Pertinent  Health Maintenance Due  Topic Date Due  . COLONOSCOPY  09/02/2002  . MAMMOGRAM  01/14/2017  . DEXA SCAN  09/02/2017  . OPHTHALMOLOGY EXAM  10/11/2018  . PNA vac Low Risk Adult (2 of 2 - PCV13) 11/01/2018  . INFLUENZA VACCINE  03/30/2019 (Originally 02/23/2019)  .  HEMOGLOBIN A1C  05/09/2019  . FOOT EXAM  10/09/2019   Fall Risk  03/06/2018 10/20/2017 09/01/2017 08/15/2017 07/28/2017  Falls in the past year? Yes Yes Yes Yes Yes  Number falls in past yr: 1 2 or more 2 or more 1 2 or more  Comment - - 7 times  - -  Injury with Fall? Yes Yes No No Yes  Comment - Broken jaw - - -  Risk Factor Category  High Fall Risk High Fall Risk High Fall Risk High Fall Risk High Fall Risk  Risk for fall due to : Impaired balance/gait History of fall(s);Impaired mobility;Impaired balance/gait Impaired balance/gait;Impaired mobility Impaired balance/gait;Impaired mobility History of fall(s);Medication side effect;Impaired mobility  Follow up Falls prevention discussed - Falls prevention discussed Education provided Falls prevention discussed     Vitals:   02/27/19 0819  BP: 138/80  Pulse: 78  Resp: 20  Temp: 97.8 F (36.6 C)  TempSrc: Oral   SpO2: 99%  Weight: 189 lb 6.4 oz (85.9 kg)  Height: 5\' 1"  (1.549 m)   Body mass index is 35.79 kg/m.  Physical Exam  GENERAL APPEARANCE: Well nourished. In no acute distress. Obese SKIN:  Skin is warm and dry.  MOUTH and THROAT: Lips are without lesions. Oral mucosa is moist and without lesions. Tongue is normal in shape, size, and color and without lesions RESPIRATORY: Breathing is even & unlabored, BS CTAB CARDIAC: RRR, no murmur,no extra heart sounds, BLE 2+edema GI: Abdomen soft, normal BS, no masses, no tenderness EXTREMITIES:  Able to move X 4 extremities NEUROLOGICAL: There is no tremor. Speech is clear. Alert and oriented X 3. PSYCHIATRIC:  Affect and behavior are appropriate   Labs reviewed: Recent Labs    06/02/18 0356 06/03/18 0548 06/04/18 0501 06/07/18 07/06/18 09/06/18  NA 137 136 136 140 142 138  K 3.4* 3.7 3.6 4.3 4.4 4.3  CL 106 104 104  --   --   --   CO2 23 25 25   --   --   --   GLUCOSE 104* 137* 126*  --   --   --   BUN 12 13 17  25* 21 16  CREATININE 1.05* 1.09* 0.91 0.8 1.1 1.2*  CALCIUM 8.1* 8.5* 8.6*  --   --   --    Recent Labs    04/25/18 0635 05/31/18 1401  AST 18 34  ALT 13 17  ALKPHOS 67 62  BILITOT 0.4 0.6  PROT 9.0* 9.1*  ALBUMIN 3.2* 3.4*   Recent Labs    06/01/18 0035 06/03/18 0548 06/04/18 0501 06/07/18 09/06/18 09/28/18  WBC 12.9* 13.5* 13.7* 16.9 14.5 13.6  NEUTROABS 9.6*  --   --  14 11 10   HGB 12.0 10.5* 11.3* 12.6 10.7* 10.7*  HCT 37.9 34.1* 35.8* 38 32* 32*  MCV 90.2 90.2 89.5  --   --   --   PLT 395 424* 415* 433* 422* 412*   Lab Results  Component Value Date   TSH 1.89 10/02/2018   Lab Results  Component Value Date   HGBA1C 5.8 02/06/2019   Lab Results  Component Value Date   CHOL 178 08/30/2012   HDL 49 08/30/2012   LDLCALC 92 08/30/2012   TRIG 187 (H) 08/30/2012   CHOLHDL 3.6 08/30/2012     Assessment/Plan  1. Primary osteoarthritis of both knees - knees ar hurting, will increase Biofreeze 4%  gel from twice daily to 4 times daily  2. Type 2 diabetes mellitus treated without  insulin [facility]) Lab Results  Component Value Date   HGBA1C 5.8 02/06/2019  -Well-controlled, continue metformin  3. Seizure disorder ([place]) -No recent seizures, continue Keppra  4. Screening for colon cancer - will check stool occult blood x3   Family/ staff Communication:  Discussed plan of care with resident.  Labs/tests ordered:   Stool occult blood X 3  Goals of care:  Long-term care    Durenda Age, DNP, FNP-BC [facility] [phone] (Monday-Friday 8:00 a.m. - 5:00 p.m.) [phone] (after hours)

## 2019-03-07 ENCOUNTER — Encounter: Payer: Self-pay | Admitting: Adult Health

## 2019-03-07 ENCOUNTER — Non-Acute Institutional Stay (SKILLED_NURSING_FACILITY): Payer: Medicare Other | Admitting: Adult Health

## 2019-03-07 DIAGNOSIS — E119 Type 2 diabetes mellitus without complications: Secondary | ICD-10-CM

## 2019-03-07 DIAGNOSIS — Z7189 Other specified counseling: Secondary | ICD-10-CM

## 2019-03-07 DIAGNOSIS — G40909 Epilepsy, unspecified, not intractable, without status epilepticus: Secondary | ICD-10-CM | POA: Diagnosis not present

## 2019-03-07 DIAGNOSIS — I10 Essential (primary) hypertension: Secondary | ICD-10-CM

## 2019-03-07 NOTE — Progress Notes (Signed)
Location:  [place] Room Number: 203-A Place of Service:  SNF (31) Provider:  Durenda Age, DNP, FNP-BC  Patient Care Team: Hendricks Limes, MD as PCP - General (Internal Medicine) Medina-Vargas, Senaida Lange, NP as Nurse Practitioner (Internal Medicine)  Extended Emergency Contact Information Primary Emergency Contact: Courtnee, Myer Home Phone: [phone] Relation: Sister Secondary Emergency Contact: Sandria Manly Mobile Phone: [phone] Relation: Niece  Code Status:  Full Code  Goals of care: Advanced Directive information Advanced Directives 02/27/2019  Does Patient Have a Medical Advance Directive? Yes  Type of Advance Directive (No Data)  Does patient want to make changes to medical advance directive? No - Patient declined  Would patient like information on creating a medical advance directive? -  Pre-existing out of facility DNR order (yellow form or pink MOST form) -     Chief Complaint  Patient presents with  . Advanced Directive    Care Plan Meeting    HPI:  Pt is a 68 y.o. female seen today for a care plan meeting. She is a long-term care resident of [facility]. She has a PMH of CAD, DM, carpal tunnel syndrome, and asthma. Care plan meeting was attended by NP, social worker and MDS coordinator. Ms. Cubillos does not want to attend meeting. She continues to want to be full code. Medications were discussed. No recent seizures. Latest weight is 193.6 lbs Body mass index is 36.58 kg/m.  She has been gaining weight. Her CBGs are well-controlled. The meeting lasted for 20 minutes.   Past Medical History:  Diagnosis Date  . Allergic rhinitis   . Arthritis    "ankles, feet, knees" (10/30/2017)  . Asthma   . Bursitis   . Carpal tunnel syndrome   . GERD (gastroesophageal reflux disease)   . HTN (hypertension)   . Migraine    "a couple/yr" (10/30/2017)  . Obesity   . Peripheral vascular disease ([place])   .  Pneumonia 1960s X 1  . Sciatica   . Tendinitis   . Type II diabetes mellitus (HCC)    Type II  . UTI (urinary tract infection)    Past Surgical History:  Procedure Laterality Date  . CRANIOTOMY N/A 01/19/2018   Procedure: ENDOSCOPIC TRANSPHENOIDAL RESECTION OF PITUITARY TUMOR;  Surgeon: Consuella Lose, MD;  Location: [place];  Service: Neurosurgery;  Laterality: N/A;  ENDOSCOPIC TRANSPHENOIDAL RESECTION OF PITUITARY TUMOR  . GANGLION CYST EXCISION Left   . KNEE ARTHROSCOPY Left 2003  . PLANTAR FASCIA RELEASE Right 1995   "heel"  . SINUS ENDO WITH FUSION N/A 01/19/2018   Procedure: SINUS ENDO WITH FUSION;  Surgeon: Jerrell Belfast, MD;  Location: [place];  Service: ENT;  Laterality: N/A;  . TONSILLECTOMY  1958  . TRANSPHENOIDAL APPROACH EXPOSURE N/A 01/19/2018   Procedure: TRANSPHENOIDAL  RESECTION OF PITUITARY TUMOR;  Surgeon: Jerrell Belfast, MD;  Location: [place];  Service: ENT;  Laterality: N/A;  . [place]; 1997   Dr [name]    Allergies  Allergen Reactions  . Penicillins Hives and Other (See Comments)    ++ got keflex in 2013 and 2019++ Has patient had a PCN reaction causing immediate rash, facial/tongue/throat swelling, SOB or lightheadedness with hypotension: No Has patient had a PCN reaction causing severe rash involving mucus membranes or skin necrosis: No PATIENT HAS HAD A PCN REACTION THAT REQUIRED HOSPITALIZATION:  #  #  YES  #  #  Has patient had a PCN reaction occurring within  the last 10 years: No   . Codeine Nausea And Vomiting  . Indomethacin Diarrhea    Outpatient Encounter Medications as of 03/07/2019  Medication Sig  . acetaminophen (TYLENOL) 650 MG CR tablet Take 650 mg by mouth every 6 (six) hours as needed for pain.  [name] ADVAIR DISKUS 100-50 MCG/DOSE AEPB Inhale 1 puff into the lungs 2 (two) times daily.  [name] albuterol (PROVENTIL HFA;VENTOLIN HFA) 108 (90 Base) MCG/ACT inhaler Inhale 2 puffs into the lungs every 6 (six) hours as needed for  wheezing or shortness of breath.  . bisacodyl (DULCOLAX) 10 MG suppository Constipation (2 of 4): If not relieved by MOM, give 10 mg Bisacodyl suppositiory rectally X 1 dose in 24 hours as needed (Do not use constipation standing orders for residents with renal failure/CFR less than 30. Contact MD for orders)  . Carboxymeth-Glycerin-Polysorb (REFRESH OPTIVE ADVANCED OP) Apply 1 drop to eye 4 (four) times daily. OU  . Cholecalciferol (VITAMIN D3) 125 MCG (5000 UT) CAPS Take 1 capsule by mouth daily.   [name] esomeprazole (NEXIUM) 20 MG capsule Take 1 capsule (20 mg total) by mouth every morning. For acid reflex  . furosemide (LASIX) 40 MG tablet TAKE ONE TABLET BY MOUTH EVERY DAY  . levETIRAcetam (KEPPRA) 500 MG tablet Take 1 tablet (500 mg total) by mouth 2 (two) times daily.  [name] lisinopril (PRINIVIL,ZESTRIL) 40 MG tablet Take 40 mg by mouth daily.   . magnesium hydroxide (MILK OF MAGNESIA) 400 MG/5ML suspension Constipation (1 of 4): If no BM in 3 days, give 30 cc Milk of Magnesium p.o. x 1 dose in 24 hours as needed (Do not use standing constipation orders for residents with renal failure CFR less than 30. Contact MD for orders)  . Menthol, Topical Analgesic, (BIOFREEZE) 4 % GEL Apply 1 application topically 4 (four) times daily. Apply to bilateral knees for osteoarthritis  . metFORMIN (GLUCOPHAGE) 500 MG tablet Take 500 mg by mouth 2 (two) times daily with a meal.  . metoprolol succinate (TOPROL-XL) 25 MG 24 hr tablet Take 25 mg by mouth 2 (two) times daily.   . mometasone (NASONEX) 50 MCG/ACT nasal spray Place 1 spray into the nose as needed.  . naproxen (NAPROSYN) 500 MG tablet Take 500 mg by mouth 2 (two) times daily as needed for moderate pain (Joint pain). Take after meals   No facility-administered encounter medications on file as of 03/07/2019.     Review of Systems  GENERAL: No change in appetite, no fatigue, no weight changes, no fever, chills or weakness MOUTH and THROAT: Denies oral  discomfort, gingival pain or bleeding RESPIRATORY: no cough, SOB, DOE, wheezing, hemoptysis CARDIAC: No chest pain, edema or palpitations GI: No abdominal pain, diarrhea, constipation, heart burn, nausea or vomiting GU: Denies dysuria, frequency, hematuria, incontinence, or discharge NEUROLOGICAL: Denies dizziness, syncope, numbness, or headache PSYCHIATRIC: Denies feelings of depression or anxiety. No report of hallucinations, insomnia, paranoia, or agitation    Immunization History  Administered Date(s) Administered  . Influenza Split 03/08/2013  . Influenza Whole 09/03/2009, 06/03/2010  . Influenza,inj,Quad PF,6+ Mos 06/25/2014  . Influenza-Unspecified 06/08/2017, 03/25/2018  . Pneumococcal Polysaccharide-23 07/25/2002, 10/31/2017  . Td 07/25/2005   Pertinent  Health Maintenance Due  Topic Date Due  . COLONOSCOPY  09/02/2002  . MAMMOGRAM  01/14/2017  . DEXA SCAN  09/02/2017  . PNA vac Low Risk Adult (2 of 2 - PCV13) 11/01/2018  . INFLUENZA VACCINE  03/30/2019 (Originally 02/23/2019)  . HEMOGLOBIN A1C  05/09/2019  .  OPHTHALMOLOGY EXAM  09/08/2019  . FOOT EXAM  10/09/2019   Fall Risk  03/06/2018 10/20/2017 09/01/2017 08/15/2017 07/28/2017  Falls in the past year? Yes Yes Yes Yes Yes  Number falls in past yr: 1 2 or more 2 or more 1 2 or more  Comment - - 7 times  - -  Injury with Fall? Yes Yes No No Yes  Comment - Broken jaw - - -  Risk Factor Category  High Fall Risk High Fall Risk High Fall Risk High Fall Risk High Fall Risk  Risk for fall due to : Impaired balance/gait History of fall(s);Impaired mobility;Impaired balance/gait Impaired balance/gait;Impaired mobility Impaired balance/gait;Impaired mobility History of fall(s);Medication side effect;Impaired mobility  Follow up Falls prevention discussed - Falls prevention discussed Education provided Falls prevention discussed     Vitals:   03/07/19 0913  BP: 131/77  Pulse: 79  Resp: 20  Temp: 98.4 F (36.9 C)  TempSrc: Oral   SpO2: 99%  Weight: 193 lb 9.6 oz (87.8 kg)  Height: 5\' 1"  (1.549 m)   Body mass index is 36.58 kg/m.  Physical Exam  GENERAL APPEARANCE: Well nourished. In no acute distress. Obese SKIN:  Skin is warm and dry. EARS: Pinnae are normal. Patient hears normal voice tunes of the examiner MOUTH and THROAT: Lips are without lesions. Oral mucosa is moist and without lesions. Tongue is normal in shape, size, and color and without lesions RESPIRATORY: Breathing is even & unlabored, BS CTAB CARDIAC: RRR, no murmur,no extra heart sounds, BLE 2+ edema GI: Abdomen soft, normal BS, no masses, no tenderness EXTREMITIES:  Able to move X 4 extremities NEUROLOGICAL: There is no tremor. Speech is clear. Alert to self, disoriented to time and place. PSYCHIATRIC: . Affect and behavior are appropriate   Labs reviewed: Recent Labs    06/02/18 0356 06/03/18 0548 06/04/18 0501 06/07/18 07/06/18 09/06/18  NA 137 136 136 140 142 138  K 3.4* 3.7 3.6 4.3 4.4 4.3  CL 106 104 104  --   --   --   CO2 23 25 25   --   --   --   GLUCOSE 104* 137* 126*  --   --   --   BUN 12 13 17  25* 21 16  CREATININE 1.05* 1.09* 0.91 0.8 1.1 1.2*  CALCIUM 8.1* 8.5* 8.6*  --   --   --    Recent Labs    04/25/18 0635 05/31/18 1401  AST 18 34  ALT 13 17  ALKPHOS 67 62  BILITOT 0.4 0.6  PROT 9.0* 9.1*  ALBUMIN 3.2* 3.4*   Recent Labs    06/01/18 0035 06/03/18 0548 06/04/18 0501 06/07/18 09/06/18 09/28/18  WBC 12.9* 13.5* 13.7* 16.9 14.5 13.6  NEUTROABS 9.6*  --   --  14 11 10   HGB 12.0 10.5* 11.3* 12.6 10.7* 10.7*  HCT 37.9 34.1* 35.8* 38 32* 32*  MCV 90.2 90.2 89.5  --   --   --   PLT 395 424* 415* 433* 422* 412*   Lab Results  Component Value Date   TSH 1.89 10/02/2018   Lab Results  Component Value Date   HGBA1C 5.8 02/06/2019   Lab Results  Component Value Date   CHOL 178 08/30/2012   HDL 49 08/30/2012   LDLCALC 92 08/30/2012   TRIG 187 (H) 08/30/2012   CHOLHDL 3.6 08/30/2012     Assessment/Plan  1. Seizure disorder ([place]) -No reported seizures, continue Keppra  2. Type 2 diabetes mellitus treated  without insulin ([place]) Lab Results  Component Value Date   HGBA1C 5.8 02/06/2019  -Well-controlled, continue metformin  3. Essential hypertension -Well-controlled, continue metoprolol tartrate  4. Advance care planning -Discussed medications, vital signs and weights    Family/ staff Communication:  Discussed plan of care with IDT.  Labs/tests ordered:  None  Goals of care:   Long-term care.   Durenda Age, DNP, FNP-BC [facility] [phone] (Monday-Friday 8:00 a.m. - 5:00 p.m.) [phone] (after hours)

## 2019-03-08 ENCOUNTER — Other Ambulatory Visit: Payer: Self-pay

## 2019-03-08 ENCOUNTER — Ambulatory Visit: Payer: Medicare Other | Admitting: Obstetrics & Gynecology

## 2019-03-08 DIAGNOSIS — N950 Postmenopausal bleeding: Secondary | ICD-10-CM

## 2019-03-08 NOTE — Progress Notes (Signed)
@  954am unable to reach, lives in a nursing facility and the secretary doesn't know how to reach her today they said they assumed the appointment was at 1115am and that she should be back in the facility by then. And to go ahead and reschedule and advised the secretary of the time of the appointment so that they can have the patient ready and a device available to do the [employer]. The # to call is [phone].

## 2019-03-18 ENCOUNTER — Other Ambulatory Visit (HOSPITAL_COMMUNITY): Payer: Medicare Other

## 2019-03-22 ENCOUNTER — Ambulatory Visit: Payer: Medicare Other | Admitting: Obstetrics & Gynecology

## 2019-03-28 ENCOUNTER — Ambulatory Visit: Payer: Medicare Other | Admitting: Obstetrics & Gynecology

## 2019-04-04 ENCOUNTER — Encounter: Payer: Self-pay | Admitting: Internal Medicine

## 2019-04-04 ENCOUNTER — Non-Acute Institutional Stay (SKILLED_NURSING_FACILITY): Payer: Medicare Other | Admitting: Internal Medicine

## 2019-04-04 DIAGNOSIS — E559 Vitamin D deficiency, unspecified: Secondary | ICD-10-CM | POA: Diagnosis not present

## 2019-04-04 DIAGNOSIS — E041 Nontoxic single thyroid nodule: Secondary | ICD-10-CM | POA: Diagnosis not present

## 2019-04-04 DIAGNOSIS — E119 Type 2 diabetes mellitus without complications: Secondary | ICD-10-CM

## 2019-04-04 DIAGNOSIS — D497 Neoplasm of unspecified behavior of endocrine glands and other parts of nervous system: Secondary | ICD-10-CM

## 2019-04-04 NOTE — Assessment & Plan Note (Signed)
04/04/2019 I cannot definitely palpate any nodules on exam.  This can be reassessed by Dr. Cruzita Lederer when seen in the next 6 months.

## 2019-04-04 NOTE — Assessment & Plan Note (Signed)
Endocrinology appointment is scheduled for 10/02/2019 with Dr. Cruzita Lederer

## 2019-04-04 NOTE — Progress Notes (Signed)
NURSING HOME LOCATION:  [facility] ROOM NUMBER:  203-A  CODE STATUS:  Full Code  PCP:  Hendricks Limes, MD  [place] [address]  This is a nursing facility follow up of chronic medical diagnoses.  Interim medical record and care since last [place] visit was updated with review of diagnostic studies and change in clinical status since last visit were documented.  HPI: She is a permanent resident facility with medical diagnoses of essential hypertension, asthma, NASH, GERD, non-insulin-dependent diabetes, pituitary macroadenoma with extracellular extension, seizure disorder, neurocognitive defects, migraine headache, PVD, and vitamin D deficiency. Endoscopic transphenoidal resection of pituitary macroadenoma was completed in 12/2017. She continues to be followed by an Endocrinologist, Dr. Cruzita Lederer. Remotely she had uterine fibroid surgery.  Fasting blood glucoses range from 91 up to 166.  11 of the 12 values are 102 or less.  A1c was prediabetic at 5.8% on 02/06/2019.  Review of systems: Her chief complaint is pain in the knees with rainy weather.  Topical agent has been of benefit.  She does state that her knees swell when symptomatic.  She describes an occasional rattly nonproductive cough.  Rare clear sputum production reported.  She feels this is related to allergies but denies significant allergic symptoms at this time.  She describes a "little depression" as that she has "things I have to do because my parents and brothers have died".  She does not mention hallucinations about her parents today as noted previously.   She also describes occasional occipital headache which responds to as needed meds.  She denies any COVID related symptoms or signs.  Constitutional: No fever, significant weight change, fatigue  Eyes: No redness, discharge, pain, vision change ENT/mouth: No nasal congestion,  purulent discharge, earache, change in hearing, sore  throat  Cardiovascular: No chest pain, palpitations, paroxysmal nocturnal dyspnea, claudication, edema  Respiratory: No hemoptysis, DOE, significant snoring, apnea   Gastrointestinal: No heartburn, dysphagia, abdominal pain, nausea /vomiting, rectal bleeding, melena, change in bowels Genitourinary: No dysuria, hematuria, pyuria, incontinence, nocturia Dermatologic: No rash, pruritus, change in appearance of skin Neurologic: No dizziness, headache, syncope, seizures, numbness, tingling Psychiatric: No insomnia, anorexia Endocrine: No change in hair/skin/nails, excessive thirst, excessive hunger, excessive urination  Hematologic/lymphatic: No significant bruising, lymphadenopathy, abnormal bleeding Allergy/immunology: No itchy/watery eyes, significant sneezing, urticaria, angioedema  Physical exam:  Pertinent or positive findings: Affect is markedly flat.  She has slight ptosis of the right eye.  There is no visual field deficit to confrontation.  There is slight decrease in the left nasolabial fold.  Thyroid is smooth without palpable nodule.  She has mild diffuse low-grade rhonchi on expiration.  Grade 1-1 0.5 systolic murmur is present at the base.  Abdomen is protuberant.  Dorsalis pedis pulses are stronger than posterior tibial pulses.  She has one half-1+ pitting edema.  There is fusiform enlargement of the knees with subjective pain with flexion.  I cannot appreciate effusion.  The left lower extremity is weaker than the right lower extremity.  Strength in the upper extremities is good.  General appearance: Adequately nourished; no acute distress, increased work of breathing is present.   Lymphatic: No lymphadenopathy about the head, neck, axilla. Eyes: No conjunctival inflammation or lid edema is present. There is no scleral icterus. Ears:  External ear exam shows no significant lesions or deformities.   Nose:  External nasal examination shows no deformity or inflammation. Nasal mucosa are  pink and moist without lesions, exudates Oral exam:  Lips and gums are healthy appearing. There is no oropharyngeal erythema or exudate. Neck:  No thyromegaly, masses, tenderness noted.    Heart:  Normal rate and regular rhythm. S1 and S2 normal without gallop, click, rub .  Lungs:  without wheezes,  rales, rubs. Abdomen: Bowel sounds are normal. Abdomen is soft and nontender with no organomegaly, hernias, masses. GU: Deferred  Extremities:  No cyanosis, clubbing  Neurologic exam : Balance, Rhomberg, finger to nose testing could not be completed due to clinical state Skin: Warm & dry w/o tenting. No significant lesions or rash.  See summary under each active problem in the Problem List with associated updated therapeutic plan

## 2019-04-04 NOTE — Assessment & Plan Note (Signed)
A1c value was 5.8 on 7/15 indicating prediabetes.  Glucoses have been well controlled with only 1 out of 12 values over 102. Fasting blood sugars will be checked every Monday with request for report if over 200.

## 2019-04-04 NOTE — Patient Instructions (Signed)
See assessment and plan under each diagnosis in the problem list and acutely for this visit 

## 2019-04-04 NOTE — Assessment & Plan Note (Signed)
Vitamin D level was 16.78 on 10/30/2018.  Vitamin D level will be updated as she is on high-dose supplementation.

## 2019-04-08 DIAGNOSIS — E669 Obesity, unspecified: Secondary | ICD-10-CM | POA: Diagnosis not present

## 2019-04-08 DIAGNOSIS — E559 Vitamin D deficiency, unspecified: Secondary | ICD-10-CM | POA: Diagnosis not present

## 2019-04-08 DIAGNOSIS — I872 Venous insufficiency (chronic) (peripheral): Secondary | ICD-10-CM | POA: Diagnosis not present

## 2019-04-08 LAB — VITAMIN D 25 HYDROXY (VIT D DEFICIENCY, FRACTURES): Vit D, 25-Hydroxy: 46.83

## 2019-05-01 ENCOUNTER — Non-Acute Institutional Stay (SKILLED_NURSING_FACILITY): Payer: Medicare Other | Admitting: Adult Health

## 2019-05-01 ENCOUNTER — Encounter: Payer: Self-pay | Admitting: Adult Health

## 2019-05-01 DIAGNOSIS — I872 Venous insufficiency (chronic) (peripheral): Secondary | ICD-10-CM

## 2019-05-01 DIAGNOSIS — K219 Gastro-esophageal reflux disease without esophagitis: Secondary | ICD-10-CM | POA: Diagnosis not present

## 2019-05-01 DIAGNOSIS — I10 Essential (primary) hypertension: Secondary | ICD-10-CM

## 2019-05-01 DIAGNOSIS — M170 Bilateral primary osteoarthritis of knee: Secondary | ICD-10-CM

## 2019-05-01 DIAGNOSIS — E119 Type 2 diabetes mellitus without complications: Secondary | ICD-10-CM

## 2019-05-01 DIAGNOSIS — G40909 Epilepsy, unspecified, not intractable, without status epilepticus: Secondary | ICD-10-CM | POA: Diagnosis not present

## 2019-05-01 DIAGNOSIS — Z23 Encounter for immunization: Secondary | ICD-10-CM

## 2019-05-01 NOTE — Progress Notes (Addendum)
Location:  [place] Room Number: 203/A Place of Service:  SNF (31) Provider:  Durenda Age, DNP, FNP-BC  Patient Care Team: Hendricks Limes, MD as PCP - General (Internal Medicine) Medina-Vargas, Senaida Lange, NP as Nurse Practitioner (Internal Medicine)  Extended Emergency Contact Information Primary Emergency Contact: Juda, Defreitas Home Phone: [phone] Relation: Sister Secondary Emergency Contact: Sandria Manly Mobile Phone: [phone] Relation: Niece  Code Status:  Full Code  Goals of care: Advanced Directive information Advanced Directives 05/01/2019  Does Patient Have a Medical Advance Directive? Yes  Type of Advance Directive (No Data)  Does patient want to make changes to medical advance directive? -  Would patient like information on creating a medical advance directive? -  Pre-existing out of facility DNR order (yellow form or pink MOST form) -     Chief Complaint  Patient presents with  . Medical Management of Chronic Issues    Routine visit of medical management, Blood Sugar 100 mg/dl  . Immunizations    T-Dap, Prevnar-13    HPI:  Pt is a 68 y.o. female seen today for medical management of chronic diseases. She has PMH of CAD, DM, carpal tunnel syndrome and asthma. She was seen in the room today. She is currently having restorative dressing/grooming and AROM BUE. CBGs are below 200s - 100, 113, 98, 95, 94, 98,97. She takes Metformin for DM. No recent seizures. She takes Keppra for seizure.   Past Medical History:  Diagnosis Date  . Allergic rhinitis   . Arthritis    "ankles, feet, knees" (10/30/2017)  . Asthma   . Bursitis   . Carpal tunnel syndrome   . GERD (gastroesophageal reflux disease)   . HTN (hypertension)   . Migraine    "a couple/yr" (10/30/2017)  . Obesity   . Peripheral vascular disease ([place])   . Pneumonia 1960s X 1  . Sciatica   . Tendinitis   . Type II diabetes mellitus (HCC)    Type II  . UTI  (urinary tract infection)    Past Surgical History:  Procedure Laterality Date  . CRANIOTOMY N/A 01/19/2018   Procedure: ENDOSCOPIC TRANSPHENOIDAL RESECTION OF PITUITARY TUMOR;  Surgeon: Consuella Lose, MD;  Location: [facility];  Service: Neurosurgery;  Laterality: N/A;  ENDOSCOPIC TRANSPHENOIDAL RESECTION OF PITUITARY TUMOR  . GANGLION CYST EXCISION Left   . KNEE ARTHROSCOPY Left 2003  . PLANTAR FASCIA RELEASE Right 1995   "heel"  . SINUS ENDO WITH FUSION N/A 01/19/2018   Procedure: SINUS ENDO WITH FUSION;  Surgeon: Jerrell Belfast, MD;  Location: [facility];  Service: ENT;  Laterality: N/A;  . TONSILLECTOMY  1958  . TRANSPHENOIDAL APPROACH EXPOSURE N/A 01/19/2018   Procedure: TRANSPHENOIDAL  RESECTION OF PITUITARY TUMOR;  Surgeon: Jerrell Belfast, MD;  Location: [facility];  Service: ENT;  Laterality: N/A;  . [place]; 1997   Dr [name]    Allergies  Allergen Reactions  . Penicillins Hives and Other (See Comments)    ++ got keflex in 2013 and 2019++ Has patient had a PCN reaction causing immediate rash, facial/tongue/throat swelling, SOB or lightheadedness with hypotension: No Has patient had a PCN reaction causing severe rash involving mucus membranes or skin necrosis: No PATIENT HAS HAD A PCN REACTION THAT REQUIRED HOSPITALIZATION:  #  #  YES  #  #  Has patient had a PCN reaction occurring within the last 10 years: No   . Codeine Nausea And Vomiting  . Indomethacin Diarrhea  Outpatient Encounter Medications as of 05/01/2019  Medication Sig  . acetaminophen (TYLENOL) 650 MG CR tablet Take 650 mg by mouth every 6 (six) hours as needed for pain.  [name] ADVAIR DISKUS 100-50 MCG/DOSE AEPB Inhale 1 puff into the lungs 2 (two) times daily.  [name] albuterol (PROVENTIL HFA;VENTOLIN HFA) 108 (90 Base) MCG/ACT inhaler Inhale 2 puffs into the lungs every 6 (six) hours as needed for wheezing or shortness of breath.  . bisacodyl (DULCOLAX) 10 MG suppository Constipation (2 of 4): If not  relieved by MOM, give 10 mg Bisacodyl suppositiory rectally X 1 dose in 24 hours as needed (Do not use constipation standing orders for residents with renal failure/CFR less than 30. Contact MD for orders)  . Carboxymeth-Glycerin-Polysorb (REFRESH OPTIVE ADVANCED OP) Apply 1 drop to eye 4 (four) times daily. OU  . Cholecalciferol (VITAMIN D3) 125 MCG (5000 UT) CAPS Take 1 capsule by mouth daily.   [name] esomeprazole (NEXIUM) 20 MG capsule Take 1 capsule (20 mg total) by mouth every morning. For acid reflex  . furosemide (LASIX) 40 MG tablet TAKE ONE TABLET BY MOUTH EVERY DAY  . levETIRAcetam (KEPPRA) 500 MG tablet Take 1 tablet (500 mg total) by mouth 2 (two) times daily.  [name] lisinopril (PRINIVIL,ZESTRIL) 40 MG tablet Take 40 mg by mouth daily.   . magnesium hydroxide (MILK OF MAGNESIA) 400 MG/5ML suspension Constipation (1 of 4): If no BM in 3 days, give 30 cc Milk of Magnesium p.o. x 1 dose in 24 hours as needed (Do not use standing constipation orders for residents with renal failure CFR less than 30. Contact MD for orders)  . Menthol, Topical Analgesic, (BIOFREEZE) 4 % GEL Apply 1 application topically 4 (four) times daily. Apply to bilateral knees for osteoarthritis  . metFORMIN (GLUCOPHAGE) 500 MG tablet Take 500 mg by mouth 2 (two) times daily with a meal.  . metoprolol succinate (TOPROL-XL) 25 MG 24 hr tablet Take 25 mg by mouth 2 (two) times daily.   . mometasone (NASONEX) 50 MCG/ACT nasal spray Place 1 spray into the nose as needed.  . naproxen (NAPROSYN) 500 MG tablet Take 500 mg by mouth 2 (two) times daily as needed for moderate pain (Joint pain). Take after meals   No facility-administered encounter medications on file as of 05/01/2019.     Review of Systems  GENERAL: No change in appetite, no fatigue, no weight changes, no fever, chills or weakness MOUTH and THROAT: Denies oral discomfort, gingival pain or bleeding, pain from teeth or hoarseness   RESPIRATORY: no cough, SOB, DOE,  wheezing, hemoptysis CARDIAC: No chest pain or palpitations GI: No abdominal pain, diarrhea, constipation, heart burn, nausea or vomiting GU: Denies dysuria, frequency, hematuria, incontinence, or discharge NEUROLOGICAL: Denies dizziness, syncope, numbness, or headache PSYCHIATRIC: Denies feelings of depression or anxiety. No report of hallucinations, insomnia, paranoia, or agitation   Immunization History  Administered Date(s) Administered  . Influenza Split 03/08/2013  . Influenza Whole 09/03/2009, 06/03/2010  . Influenza,inj,Quad PF,6+ Mos 06/25/2014  . Influenza-Unspecified 06/08/2017, 03/25/2018  . Pneumococcal Polysaccharide-23 07/25/2002, 10/31/2017  . Td 07/25/2005   Pertinent  Health Maintenance Due  Topic Date Due  . COLONOSCOPY  09/02/2002  . MAMMOGRAM  01/14/2017  . DEXA SCAN  09/02/2017  . PNA vac Low Risk Adult (2 of 2 - PCV13) 11/01/2018  . INFLUENZA VACCINE  02/23/2019  . HEMOGLOBIN A1C  05/09/2019  . OPHTHALMOLOGY EXAM  09/08/2019  . FOOT EXAM  10/09/2019   Fall Risk  03/06/2018  10/20/2017 09/01/2017 08/15/2017 07/28/2017  Falls in the past year? Yes Yes Yes Yes Yes  Number falls in past yr: 1 2 or more 2 or more 1 2 or more  Comment - - 7 times  - -  Injury with Fall? Yes Yes No No Yes  Comment - Broken jaw - - -  Risk Factor Category  High Fall Risk High Fall Risk High Fall Risk High Fall Risk High Fall Risk  Risk for fall due to : Impaired balance/gait History of fall(s);Impaired mobility;Impaired balance/gait Impaired balance/gait;Impaired mobility Impaired balance/gait;Impaired mobility History of fall(s);Medication side effect;Impaired mobility  Follow up Falls prevention discussed - Falls prevention discussed Education provided Falls prevention discussed     Vitals:   05/01/19 0833  BP: (!) 172/97  Pulse: 89  Resp: 17  Temp: 97.9 F (36.6 C)  TempSrc: Oral  SpO2: 98%  Weight: 193 lb 9.6 oz (87.8 kg)  Height: 5\' 1"  (1.549 m)   Body mass index is  36.58 kg/m.  Physical Exam  GENERAL APPEARANCE: Well nourished. In no acute distress. Obese SKIN:  Skin is warm and dry.  MOUTH and THROAT: Lips are without lesions. Oral mucosa is moist and without lesions. Tongue is normal in shape, size, and color and without lesions RESPIRATORY: Breathing is even & unlabored, BS CTAB CARDIAC: RRR, no murmur,no extra heart sounds, BLE 1-2+ edema GI: Abdomen soft, normal BS, no masses, no tenderness EXTREMITIES:  Able to move X 4 extremities NEUROLOGICAL: There is no tremor. Speech is clear. Alert to self and time, disoriented to place PSYCHIATRIC:  Affect and behavior are appropriate  Labs reviewed: Recent Labs    06/02/18 0356 06/03/18 0548 06/04/18 0501 06/07/18 07/06/18 09/06/18  NA 137 136 136 140 142 138  K 3.4* 3.7 3.6 4.3 4.4 4.3  CL 106 104 104  --   --   --   CO2 23 25 25   --   --   --   GLUCOSE 104* 137* 126*  --   --   --   BUN 12 13 17  25* 21 16  CREATININE 1.05* 1.09* 0.91 0.8 1.1 1.2*  CALCIUM 8.1* 8.5* 8.6*  --   --   --    Recent Labs    05/31/18 1401  AST 34  ALT 17  ALKPHOS 62  BILITOT 0.6  PROT 9.1*  ALBUMIN 3.4*   Recent Labs    06/01/18 0035 06/03/18 0548 06/04/18 0501 06/07/18 09/06/18 09/28/18  WBC 12.9* 13.5* 13.7* 16.9 14.5 13.6  NEUTROABS 9.6*  --   --  14 11 10   HGB 12.0 10.5* 11.3* 12.6 10.7* 10.7*  HCT 37.9 34.1* 35.8* 38 32* 32*  MCV 90.2 90.2 89.5  --   --   --   PLT 395 424* 415* 433* 422* 412*   Lab Results  Component Value Date   TSH 1.89 10/02/2018   Lab Results  Component Value Date   HGBA1C 5.8 02/06/2019   Lab Results  Component Value Date   CHOL 178 08/30/2012   HDL 49 08/30/2012   LDLCALC 92 08/30/2012   TRIG 187 (H) 08/30/2012   CHOLHDL 3.6 08/30/2012     Assessment/Plan  1. Seizure disorder ([place]) - no seizure, continue Keppra, fall precautions  2. Primary osteoarthritis of both knees - stable, continue Biofreeze 4% gel, MAPAP Arthritis ER PRN and Naprosyn  3.  Essential hypertension - stable, continue Metoprolol tartrate and lisinopril  4. Chronic venous insufficiency - Continue Lasix  5. Gastroesophageal reflux disease without esophagitis - denies heartburns, continue esomeprazole  6. Type 2 diabetes mellitus treated without insulin (HCC) Lab Results  Component Value Date   HGBA1C 5.8 02/06/2019  -Continue metformin, CBG checks  7. Need for vaccination - ordered Prevnar 0.5 ml IM X 1 and Tdap 0.5 ml IM X 1     Family/ staff Communication:  Discussed plan of care with resident.  Labs/tests ordered:  None  Goals of care:   Long-term care   Durenda Age, DNP, FNP-BC [facility] and [facility] [phone] (Monday-Friday 8:00 a.m. - 5:00 p.m.) [phone] (after hours)

## 2019-05-06 DIAGNOSIS — Z23 Encounter for immunization: Secondary | ICD-10-CM | POA: Diagnosis not present

## 2019-05-07 DIAGNOSIS — J069 Acute upper respiratory infection, unspecified: Secondary | ICD-10-CM | POA: Diagnosis not present

## 2019-05-23 ENCOUNTER — Non-Acute Institutional Stay (SKILLED_NURSING_FACILITY): Payer: Medicare Other | Admitting: Adult Health

## 2019-05-23 ENCOUNTER — Encounter: Payer: Self-pay | Admitting: Adult Health

## 2019-05-23 DIAGNOSIS — G40909 Epilepsy, unspecified, not intractable, without status epilepticus: Secondary | ICD-10-CM | POA: Diagnosis not present

## 2019-05-23 DIAGNOSIS — E119 Type 2 diabetes mellitus without complications: Secondary | ICD-10-CM | POA: Diagnosis not present

## 2019-05-23 DIAGNOSIS — K219 Gastro-esophageal reflux disease without esophagitis: Secondary | ICD-10-CM | POA: Diagnosis not present

## 2019-05-23 DIAGNOSIS — G8929 Other chronic pain: Secondary | ICD-10-CM

## 2019-05-23 DIAGNOSIS — I10 Essential (primary) hypertension: Secondary | ICD-10-CM

## 2019-05-23 DIAGNOSIS — J45909 Unspecified asthma, uncomplicated: Secondary | ICD-10-CM | POA: Diagnosis not present

## 2019-05-23 DIAGNOSIS — M25562 Pain in left knee: Secondary | ICD-10-CM | POA: Diagnosis not present

## 2019-05-23 DIAGNOSIS — M25561 Pain in right knee: Secondary | ICD-10-CM | POA: Diagnosis not present

## 2019-05-23 NOTE — Progress Notes (Signed)
Location:  [facility] Room Number: 203/A Place of Service:  SNF (31) Provider:  Durenda Age, DNP, FNP-BC  Patient Care Team: Hendricks Limes, MD as PCP - General (Internal Medicine) Medina-Vargas, Senaida Lange, NP as Nurse Practitioner (Internal Medicine)  Extended Emergency Contact Information Primary Emergency Contact: Dolphine, Skalicky Home Phone: [phone] Relation: Sister Secondary Emergency Contact: Sandria Manly Mobile Phone: [phone] Relation: Niece  Code Status:  Full Code  Goals of care: Advanced Directive information Advanced Directives 05/23/2019  Does Patient Have a Medical Advance Directive? Yes  Type of Advance Directive (No Data)  Does patient want to make changes to medical advance directive? -  Would patient like information on creating a medical advance directive? -  Pre-existing out of facility DNR order (yellow form or pink MOST form) -     Chief Complaint  Patient presents with  . Medical Management of Chronic Issues    Routine visit of medical management    HPI:  Pt is a 68 y.o. female seen today for medical management of chronic diseases. She has PMH of CAD, DM, carpal tunnel syndrome and asthma. She is having restorative program - AROM BUE, walking program and dressing/grooming. No reported seizures. She takes Levetiracetam for seizure. BPs 146/76, 138/70, 145/75. She takes Metoprolol tartrate and Lisinopril for hypertension.    Past Medical History:  Diagnosis Date  . Allergic rhinitis   . Arthritis    "ankles, feet, knees" (10/30/2017)  . Asthma   . Bursitis   . Carpal tunnel syndrome   . GERD (gastroesophageal reflux disease)   . HTN (hypertension)   . Migraine    "a couple/yr" (10/30/2017)  . Obesity   . Peripheral vascular disease ([place])   . Pneumonia 1960s X 1  . Sciatica   . Tendinitis   . Type II diabetes mellitus (HCC)    Type II  . UTI (urinary tract infection)    Past Surgical History:   Procedure Laterality Date  . CRANIOTOMY N/A 01/19/2018   Procedure: ENDOSCOPIC TRANSPHENOIDAL RESECTION OF PITUITARY TUMOR;  Surgeon: Consuella Lose, MD;  Location: [facility];  Service: Neurosurgery;  Laterality: N/A;  ENDOSCOPIC TRANSPHENOIDAL RESECTION OF PITUITARY TUMOR  . GANGLION CYST EXCISION Left   . KNEE ARTHROSCOPY Left 2003  . PLANTAR FASCIA RELEASE Right 1995   "heel"  . SINUS ENDO WITH FUSION N/A 01/19/2018   Procedure: SINUS ENDO WITH FUSION;  Surgeon: Jerrell Belfast, MD;  Location: [facility];  Service: ENT;  Laterality: N/A;  . TONSILLECTOMY  1958  . TRANSPHENOIDAL APPROACH EXPOSURE N/A 01/19/2018   Procedure: TRANSPHENOIDAL  RESECTION OF PITUITARY TUMOR;  Surgeon: Jerrell Belfast, MD;  Location: [facility];  Service: ENT;  Laterality: N/A;  . [place]; 1997   Dr [name]    Allergies  Allergen Reactions  . Penicillins Hives and Other (See Comments)    ++ got keflex in 2013 and 2019++ Has patient had a PCN reaction causing immediate rash, facial/tongue/throat swelling, SOB or lightheadedness with hypotension: No Has patient had a PCN reaction causing severe rash involving mucus membranes or skin necrosis: No PATIENT HAS HAD A PCN REACTION THAT REQUIRED HOSPITALIZATION:  #  #  YES  #  #  Has patient had a PCN reaction occurring within the last 10 years: No   . Codeine Nausea And Vomiting  . Indomethacin Diarrhea    Outpatient Encounter Medications as of 05/23/2019  Medication Sig  . acetaminophen (TYLENOL) 650 MG CR tablet Take  650 mg by mouth every 6 (six) hours as needed for pain.  [name] ADVAIR DISKUS 100-50 MCG/DOSE AEPB Inhale 1 puff into the lungs 2 (two) times daily.  [name] albuterol (PROVENTIL HFA;VENTOLIN HFA) 108 (90 Base) MCG/ACT inhaler Inhale 2 puffs into the lungs every 6 (six) hours as needed for wheezing or shortness of breath.  . bisacodyl (DULCOLAX) 10 MG suppository Constipation (2 of 4): If not relieved by MOM, give 10 mg Bisacodyl suppositiory  rectally X 1 dose in 24 hours as needed (Do not use constipation standing orders for residents with renal failure/CFR less than 30. Contact MD for orders)  . Cholecalciferol (VITAMIN D3) 125 MCG (5000 UT) CAPS Take 1 capsule by mouth daily.   [name] esomeprazole (NEXIUM) 20 MG capsule Take 1 capsule (20 mg total) by mouth every morning. For acid reflex  . furosemide (LASIX) 40 MG tablet TAKE ONE TABLET BY MOUTH EVERY DAY  . levETIRAcetam (KEPPRA) 500 MG tablet Take 1 tablet (500 mg total) by mouth 2 (two) times daily.  [name] lisinopril (PRINIVIL,ZESTRIL) 40 MG tablet Take 40 mg by mouth daily.   . magnesium hydroxide (MILK OF MAGNESIA) 400 MG/5ML suspension Constipation (1 of 4): If no BM in 3 days, give 30 cc Milk of Magnesium p.o. x 1 dose in 24 hours as needed (Do not use standing constipation orders for residents with renal failure CFR less than 30. Contact MD for orders)  . Menthol, Topical Analgesic, (BIOFREEZE) 4 % GEL Apply 1 application topically 4 (four) times daily. Apply to bilateral knees for osteoarthritis  . metFORMIN (GLUCOPHAGE) 500 MG tablet Take 500 mg by mouth 2 (two) times daily with a meal.  . metoprolol succinate (TOPROL-XL) 25 MG 24 hr tablet Take 25 mg by mouth 2 (two) times daily.   . mometasone (NASONEX) 50 MCG/ACT nasal spray Place 1 spray into the nose as needed.  . naproxen (NAPROSYN) 500 MG tablet Take 500 mg by mouth 2 (two) times daily as needed for moderate pain (Joint pain). Take after meals  . protective barrier (RESTORE) CREA Apply topically. Apply barrier cream to buttocks topically BID PRN  . [DISCONTINUED] Carboxymeth-Glycerin-Polysorb (REFRESH OPTIVE ADVANCED OP) Apply 1 drop to eye 4 (four) times daily. OU   No facility-administered encounter medications on file as of 05/23/2019.     Review of Systems  GENERAL: No change in appetite, no fatigue, no weight changes, no fever, chills or weakness MOUTH and THROAT: Denies oral discomfort, gingival pain or bleeding  RESPIRATORY: no cough, SOB, DOE, wheezing, hemoptysis CARDIAC: No chest pain, edema or palpitations GI: No abdominal pain, diarrhea, constipation, heart burn, nausea or vomiting GU: Denies dysuria, frequency, hematuria, incontinence, or discharge NEUROLOGICAL: Denies dizziness, syncope, numbness, or headache PSYCHIATRIC: Denies feelings of depression or anxiety. No report of hallucinations, insomnia, paranoia, or agitation   Immunization History  Administered Date(s) Administered  . Influenza Split 03/08/2013  . Influenza Whole 09/03/2009, 06/03/2010  . Influenza,inj,Quad PF,6+ Mos 06/25/2014  . Influenza-Unspecified 06/08/2017, 03/25/2018, 05/06/2019  . Pneumococcal Polysaccharide-23 07/25/2002, 10/31/2017  . Pneumococcal-Unspecified 05/07/2019  . Td 07/25/2005  . Tdap 05/03/2019   Pertinent  Health Maintenance Due  Topic Date Due  . COLONOSCOPY  09/02/2002  . MAMMOGRAM  01/14/2017  . DEXA SCAN  09/02/2017  . HEMOGLOBIN A1C  05/09/2019  . OPHTHALMOLOGY EXAM  09/08/2019  . FOOT EXAM  10/09/2019  . PNA vac Low Risk Adult (2 of 2 - PCV13) 05/06/2020  . INFLUENZA VACCINE  Completed   Fall Risk  03/06/2018 10/20/2017 09/01/2017 08/15/2017 07/28/2017  Falls in the past year? Yes Yes Yes Yes Yes  Number falls in past yr: 1 2 or more 2 or more 1 2 or more  Comment - - 7 times  - -  Injury with Fall? Yes Yes No No Yes  Comment - Broken jaw - - -  Risk Factor Category  High Fall Risk High Fall Risk High Fall Risk High Fall Risk High Fall Risk  Risk for fall due to : Impaired balance/gait History of fall(s);Impaired mobility;Impaired balance/gait Impaired balance/gait;Impaired mobility Impaired balance/gait;Impaired mobility History of fall(s);Medication side effect;Impaired mobility  Follow up Falls prevention discussed - Falls prevention discussed Education provided Falls prevention discussed     Vitals:   05/23/19 0915  BP: (!) 146/76  Pulse: 84  Resp: 17  Temp: 98.4 F (36.9 C)   TempSrc: Oral  SpO2: 99%  Weight: 193 lb 9.6 oz (87.8 kg)  Height: 5\' 1"  (1.549 m)   Body mass index is 36.58 kg/m.  Physical Exam  GENERAL APPEARANCE: Well nourished. In no acute distress. Obese SKIN:  Skin is warm and dry.  MOUTH and THROAT: Lips are without lesions. Oral mucosa is moist and without lesions. Tongue is normal in shape, size, and color and without lesions RESPIRATORY: Breathing is even & unlabored, BS CTAB CARDIAC: RRR, no murmur,no extra heart sounds, BLE trace edema GI: Abdomen soft, normal BS, no masses, no tenderness EXTREMITIES:  Able to move X 4 extremities NEUROLOGICAL: There is no tremor. Speech is clear. Alert and oriented X 3. PSYCHIATRIC:  Affect and behavior are appropriate  Labs reviewed: Recent Labs    06/02/18 0356 06/03/18 0548 06/04/18 0501 06/07/18 07/06/18 09/06/18  NA 137 136 136 140 142 138  K 3.4* 3.7 3.6 4.3 4.4 4.3  CL 106 104 104  --   --   --   CO2 23 25 25   --   --   --   GLUCOSE 104* 137* 126*  --   --   --   BUN 12 13 17  25* 21 16  CREATININE 1.05* 1.09* 0.91 0.8 1.1 1.2*  CALCIUM 8.1* 8.5* 8.6*  --   --   --    Recent Labs    05/31/18 1401  AST 34  ALT 17  ALKPHOS 62  BILITOT 0.6  PROT 9.1*  ALBUMIN 3.4*   Recent Labs    06/01/18 0035 06/03/18 0548 06/04/18 0501 06/07/18 09/06/18 09/28/18  WBC 12.9* 13.5* 13.7* 16.9 14.5 13.6  NEUTROABS 9.6*  --   --  14 11 10   HGB 12.0 10.5* 11.3* 12.6 10.7* 10.7*  HCT 37.9 34.1* 35.8* 38 32* 32*  MCV 90.2 90.2 89.5  --   --   --   PLT 395 424* 415* 433* 422* 412*   Lab Results  Component Value Date   TSH 1.89 10/02/2018   Lab Results  Component Value Date   HGBA1C 5.8 02/06/2019   Lab Results  Component Value Date   CHOL 178 08/30/2012   HDL 49 08/30/2012   LDLCALC 92 08/30/2012   TRIG 187 (H) 08/30/2012   CHOLHDL 3.6 08/30/2012    Assessment/Plan  1. Type 2 diabetes mellitus treated without insulin (HCC) Lab Results  Component Value Date   HGBA1C 5.8  02/06/2019  -  Well-controlled, continue Metformin  2. Moderate asthma without complication, unspecified whether persistent - no wheezing nor SOB, continue Albuterol PRN, Mometasone PRN and Advair  3. Seizure disorder ([place]) -  stable, continue Levetiracetam  4. Essential hypertension - stable, continue Metoprolol tartrate, Lisinopril and Lasix  5. Gastroesophageal reflux disease without esophagitis - continue Esomeprazole  6. Chronic pain of both knees - well-controlled, continue Biofreeze 4% gel, MAPAP Arthritis PRN and Naprosyn   Family/ staff Communication:  Discussed plan of care with resident.   Labs/tests ordered:  None  Goals of care:   Long-term care   Durenda Age, DNP, FNP-BC [facility] [phone] (Monday-Friday 8:00 a.m. - 5:00 p.m.) [phone] (after hours)

## 2019-05-24 LAB — HM DIABETES FOOT EXAM

## 2019-06-06 ENCOUNTER — Encounter: Payer: Self-pay | Admitting: Adult Health

## 2019-06-06 ENCOUNTER — Non-Acute Institutional Stay (SKILLED_NURSING_FACILITY): Payer: Medicare Other | Admitting: Adult Health

## 2019-06-06 DIAGNOSIS — G40909 Epilepsy, unspecified, not intractable, without status epilepticus: Secondary | ICD-10-CM | POA: Diagnosis not present

## 2019-06-06 DIAGNOSIS — E119 Type 2 diabetes mellitus without complications: Secondary | ICD-10-CM

## 2019-06-06 DIAGNOSIS — Z7189 Other specified counseling: Secondary | ICD-10-CM

## 2019-06-06 DIAGNOSIS — I10 Essential (primary) hypertension: Secondary | ICD-10-CM

## 2019-06-06 NOTE — Progress Notes (Signed)
Location:  [facility] Room Number: 203/A Place of Service:  SNF (31) Provider:  Durenda Age, DNP, FNP-BC  Patient Care Team: Hendricks Limes, MD as PCP - General (Internal Medicine) Medina-Vargas, Senaida Lange, NP as Nurse Practitioner (Internal Medicine)  Extended Emergency Contact Information Primary Emergency Contact: Daisi, Gayton Home Phone: [phone] Relation: Sister Secondary Emergency Contact: Sandria Manly Mobile Phone: [phone] Relation: Niece  Code Status:  Full Code  Goals of care: Advanced Directive information Advanced Directives 06/06/2019  Does Patient Have a Medical Advance Directive? Yes  Type of Advance Directive (No Data)  Does patient want to make changes to medical advance directive? No - Patient declined  Would patient like information on creating a medical advance directive? -  Pre-existing out of facility DNR order (yellow form or pink MOST form) -     Chief Complaint  Patient presents with  . Advanced Directive    Advanced Care Plan Meeting    HPI:  Mariah [name] is a 68 y.o. female seen for advanced care planning meeting today. The meeting was attended by resident, NP, social worker, MDS coordinator, niece, Ihor Austin, and her husband who attended via teleconference. She remains to be full code. She was recently seen by a podiatrist for routine foot care. She wanted for her hair to be washed so CNAs will help her with that. Medications, vital signs and weights were discussed. BPs 140/74, 125/67, 135/73, 114/59, 135/73. She takes Lisinopril for hypertension. She is currently having restortive walking, dressing/grooming and AROM BUE. The meeting lasted for 25 minutes.  Past Medical History:  Diagnosis Date  . Allergic rhinitis   . Arthritis    "ankles, feet, knees" (10/30/2017)  . Asthma   . Bursitis   . Carpal tunnel syndrome   . GERD (gastroesophageal reflux disease)   . HTN (hypertension)   . Migraine    "a  couple/yr" (10/30/2017)  . Obesity   . Peripheral vascular disease ([place])   . Pneumonia 1960s X 1  . Sciatica   . Tendinitis   . Type II diabetes mellitus (HCC)    Type II  . UTI (urinary tract infection)    Past Surgical History:  Procedure Laterality Date  . CRANIOTOMY N/A 01/19/2018   Procedure: ENDOSCOPIC TRANSPHENOIDAL RESECTION OF PITUITARY TUMOR;  Surgeon: Consuella Lose, MD;  Location: [facility];  Service: Neurosurgery;  Laterality: N/A;  ENDOSCOPIC TRANSPHENOIDAL RESECTION OF PITUITARY TUMOR  . GANGLION CYST EXCISION Left   . KNEE ARTHROSCOPY Left 2003  . PLANTAR FASCIA RELEASE Right 1995   "heel"  . SINUS ENDO WITH FUSION N/A 01/19/2018   Procedure: SINUS ENDO WITH FUSION;  Surgeon: Jerrell Belfast, MD;  Location: [facility];  Service: ENT;  Laterality: N/A;  . TONSILLECTOMY  1958  . TRANSPHENOIDAL APPROACH EXPOSURE N/A 01/19/2018   Procedure: TRANSPHENOIDAL  RESECTION OF PITUITARY TUMOR;  Surgeon: Jerrell Belfast, MD;  Location: [facility];  Service: ENT;  Laterality: N/A;  . [place]; 1997   Dr [name]    Allergies  Allergen Reactions  . Penicillins Hives and Other (See Comments)    ++ got keflex in 2013 and 2019++ Has patient had a PCN reaction causing immediate rash, facial/tongue/throat swelling, SOB or lightheadedness with hypotension: No Has patient had a PCN reaction causing severe rash involving mucus membranes or skin necrosis: No PATIENT HAS HAD A PCN REACTION THAT REQUIRED HOSPITALIZATION:  #  #  YES  #  #  Has patient had a PCN  reaction occurring within the last 10 years: No   . Codeine Nausea And Vomiting  . Indomethacin Diarrhea    Outpatient Encounter Medications as of 06/06/2019  Medication Sig  . acetaminophen (TYLENOL) 650 MG CR tablet Take 650 mg by mouth every 6 (six) hours as needed for pain.  [name] ADVAIR DISKUS 100-50 MCG/DOSE AEPB Inhale 1 puff into the lungs 2 (two) times daily.  [name] albuterol (PROVENTIL HFA;VENTOLIN HFA) 108 (90 Base)  MCG/ACT inhaler Inhale 2 puffs into the lungs every 6 (six) hours as needed for wheezing or shortness of breath.  . bisacodyl (DULCOLAX) 10 MG suppository Constipation (2 of 4): If not relieved by MOM, give 10 mg Bisacodyl suppositiory rectally X 1 dose in 24 hours as needed (Do not use constipation standing orders for residents with renal failure/CFR less than 30. Contact MD for orders)  . Cholecalciferol (VITAMIN D3) 125 MCG (5000 UT) CAPS Take 1 capsule by mouth daily.   [name] esomeprazole (NEXIUM) 20 MG capsule Take 1 capsule (20 mg total) by mouth every morning. For acid reflex  . furosemide (LASIX) 40 MG tablet TAKE ONE TABLET BY MOUTH EVERY DAY  . levETIRAcetam (KEPPRA) 500 MG tablet Take 1 tablet (500 mg total) by mouth 2 (two) times daily.  [name] lisinopril (PRINIVIL,ZESTRIL) 40 MG tablet Take 40 mg by mouth daily.   . magnesium hydroxide (MILK OF MAGNESIA) 400 MG/5ML suspension Constipation (1 of 4): If no BM in 3 days, give 30 cc Milk of Magnesium p.o. x 1 dose in 24 hours as needed (Do not use standing constipation orders for residents with renal failure CFR less than 30. Contact MD for orders)  . Menthol, Topical Analgesic, (BIOFREEZE) 4 % GEL Apply 1 application topically 4 (four) times daily. Apply to bilateral knees for osteoarthritis  . metFORMIN (GLUCOPHAGE) 500 MG tablet Take 500 mg by mouth 2 (two) times daily with a meal.  . metoprolol succinate (TOPROL-XL) 25 MG 24 hr tablet Take 25 mg by mouth 2 (two) times daily.   . mometasone (NASONEX) 50 MCG/ACT nasal spray Place 1 spray into the nose as needed.  . naproxen (NAPROSYN) 500 MG tablet Take 500 mg by mouth 2 (two) times daily as needed for moderate pain (Joint pain). Take after meals  . protective barrier (RESTORE) CREA Apply topically. Apply barrier cream to buttocks topically BID PRN   No facility-administered encounter medications on file as of 06/06/2019.     Review of Systems  GENERAL: No change in appetite, no fatigue, no  weight changes, no fever, chills or weakness MOUTH and THROAT: Denies oral discomfort, gingival pain or bleeding, pain from teeth or hoarseness   RESPIRATORY: no cough, SOB, DOE, wheezing, hemoptysis CARDIAC: No chest pain, edema or palpitations GI: No abdominal pain, diarrhea, constipation, heart burn, nausea or vomiting GU: Denies dysuria, frequency, hematuria, incontinence, or discharge NEUROLOGICAL: Denies dizziness, syncope, numbness, or headache PSYCHIATRIC: Denies feelings of depression or anxiety. No report of hallucinations, insomnia, paranoia, or agitation   Immunization History  Administered Date(s) Administered  . Influenza Split 03/08/2013  . Influenza Whole 09/03/2009, 06/03/2010  . Influenza,inj,Quad PF,6+ Mos 06/25/2014  . Influenza-Unspecified 06/08/2017, 03/25/2018, 05/06/2019  . Pneumococcal Polysaccharide-23 07/25/2002, 10/31/2017  . Pneumococcal-Unspecified 05/07/2019  . Td 07/25/2005  . Tdap 05/03/2019   Pertinent  Health Maintenance Due  Topic Date Due  . COLONOSCOPY  09/02/2002  . MAMMOGRAM  01/14/2017  . DEXA SCAN  09/02/2017  . HEMOGLOBIN A1C  05/09/2019  . OPHTHALMOLOGY EXAM  09/08/2019  .  FOOT EXAM  10/09/2019  . PNA vac Low Risk Adult (2 of 2 - PCV13) 05/06/2020  . INFLUENZA VACCINE  Completed   Fall Risk  03/06/2018 10/20/2017 09/01/2017 08/15/2017 07/28/2017  Falls in the past year? Yes Yes Yes Yes Yes  Number falls in past yr: 1 2 or more 2 or more 1 2 or more  Comment - - 7 times  - -  Injury with Fall? Yes Yes No No Yes  Comment - Broken jaw - - -  Risk Factor Category  High Fall Risk High Fall Risk High Fall Risk High Fall Risk High Fall Risk  Risk for fall due to : Impaired balance/gait History of fall(s);Impaired mobility;Impaired balance/gait Impaired balance/gait;Impaired mobility Impaired balance/gait;Impaired mobility History of fall(s);Medication side effect;Impaired mobility  Follow up Falls prevention discussed - Falls prevention discussed  Education provided Falls prevention discussed     Vitals:   06/06/19 1429  BP: (!) 149/92  Pulse: 85  Resp: 18  Temp: 98.6 F (37 C)  TempSrc: Oral  SpO2: 96%  Weight: 195 lb 3.2 oz (88.5 kg)  Height: 5\' 1"  (1.549 m)   Body mass index is 36.88 kg/m.  Physical Exam  GENERAL APPEARANCE: Well nourished. In no acute distress. Obese SKIN:  Skin is warm and dry.  MOUTH and THROAT: Lips are without lesions. Oral mucosa is moist and without lesions. Tongue is normal in shape, size, and color and without lesions RESPIRATORY: Breathing is even & unlabored, BS CTAB CARDIAC: RRR, no murmur,no extra heart sounds, BLE 1+ edema GI: Abdomen soft, normal BS, no masses, no tenderness EXTREMITIES:  Able to move X 4 extremities NEUROLOGICAL: There is no tremor. Speech is clear. Alert and oriented X 3. PSYCHIATRIC:  Affect and behavior are appropriate  Labs reviewed: Recent Labs    06/07/18 07/06/18 09/06/18  NA 140 142 138  K 4.3 4.4 4.3  BUN 25* 21 16  CREATININE 0.8 1.1 1.2*    Recent Labs    06/07/18 09/06/18 09/28/18  WBC 16.9 14.5 13.6  NEUTROABS 14 11 10   HGB 12.6 10.7* 10.7*  HCT 38 32* 32*  PLT 433* 422* 412*   Lab Results  Component Value Date   TSH 1.89 10/02/2018   Lab Results  Component Value Date   HGBA1C 5.8 02/06/2019   Lab Results  Component Value Date   CHOL 178 08/30/2012   HDL 49 08/30/2012   LDLCALC 92 08/30/2012   TRIG 187 (H) 08/30/2012   CHOLHDL 3.6 08/30/2012     Assessment/Plan  1. Essential hypertension - stable, continue lisinopril and metoprolol tartrate  2. Seizure disorder ([place]) -No recent seizures, continue levetiracetam  3. Type 2 diabetes mellitus treated without insulin (HCC) Lab Results  Component Value Date   HGBA1C 5.8 02/06/2019  -Continue Metformin  4. Advance care planning -Discussed code status, medications, vital signs and weights   Family/ staff Communication:   Discussed plan of care with resident, niece and  her husband and IDT.  Labs/tests ordered:  None  Goals of care:  Long-term care    Durenda Age, DNP, FNP-BC [facility] [phone] (Monday-Friday 8:00 a.m. - 5:00 p.m.) [phone] (after hours)

## 2019-06-27 ENCOUNTER — Non-Acute Institutional Stay (SKILLED_NURSING_FACILITY): Payer: Medicare Other | Admitting: Internal Medicine

## 2019-06-27 ENCOUNTER — Encounter: Payer: Self-pay | Admitting: Internal Medicine

## 2019-06-27 DIAGNOSIS — J45909 Unspecified asthma, uncomplicated: Secondary | ICD-10-CM

## 2019-06-27 DIAGNOSIS — E041 Nontoxic single thyroid nodule: Secondary | ICD-10-CM

## 2019-06-27 DIAGNOSIS — E119 Type 2 diabetes mellitus without complications: Secondary | ICD-10-CM

## 2019-06-27 DIAGNOSIS — D352 Benign neoplasm of pituitary gland: Secondary | ICD-10-CM | POA: Insufficient documentation

## 2019-06-27 DIAGNOSIS — I10 Essential (primary) hypertension: Secondary | ICD-10-CM

## 2019-06-27 DIAGNOSIS — I739 Peripheral vascular disease, unspecified: Secondary | ICD-10-CM | POA: Insufficient documentation

## 2019-06-27 DIAGNOSIS — K219 Gastro-esophageal reflux disease without esophagitis: Secondary | ICD-10-CM

## 2019-06-27 NOTE — Assessment & Plan Note (Signed)
Edema is significantly improved.  There is a component of venous insufficiency as well as arterial insufficiency.  Only the right dorsalis pedis pulse could be palpated.

## 2019-06-27 NOTE — Patient Instructions (Signed)
See assessment and plan under each diagnosis in the problem list and acutely for this visit 

## 2019-06-27 NOTE — Assessment & Plan Note (Addendum)
Blood pressures have ranged from a low of 122/59 up to a high of 169/93.  The lower number is an outlier as all others are over [id number] systolic.  ACE-I will be changed to ARB due to cough.

## 2019-06-27 NOTE — Assessment & Plan Note (Signed)
She denies any extrinsic symptoms or significant asthma symptoms except for occasional sneezing.  She does have evidence of expiratory rhonchi with associated cough on exam.  The high-dose ACE inhibitor will be changed to an ARB to see if this will help the reactive airway issue.

## 2019-06-27 NOTE — Assessment & Plan Note (Addendum)
She does continue to have central obesity without associated uncontrolled diabetes.  Her dementia will prevent dietary compliance.

## 2019-06-27 NOTE — Assessment & Plan Note (Signed)
Endocrinology follow-up is still scheduled 10/02/2019.

## 2019-06-27 NOTE — Assessment & Plan Note (Addendum)
She remains on a PPI; she denies dysphagia or other GI symptoms.  She has twice daily as needed naproxen ordered for joint pain.  This will be discontinued.

## 2019-06-27 NOTE — Assessment & Plan Note (Signed)
Last A1c was in July with a value of 5.8%, indicating prediabetes. Fasting glucoses have ranged from 98 up to 150 max.

## 2019-06-27 NOTE — Progress Notes (Signed)
NURSING HOME LOCATION:  [facility] ROOM NUMBER:  203-A  CODE STATUS:  FULL CODE  PCP:  Hendricks Limes, MD  [place] [address]   This is a nursing facility follow up of chronic medical diagnoses.  Interim medical record and care since last [facility] visit was updated with review of diagnostic studies and change in clinical status since last visit were documented.  HPI: She is a permanent resident of the facility with medical diagnoses of essential hypertension, chronic venous insufficiency, extrinsic rhinitis/asthma, morbid obesity, seizure disorder, thyroid nodule, neurocognitive deficits, and history of pituitary macroadenoma with extrasellar extension.  The pituitary adenoma was removed 01/19/2018. She is on a PPI for GERD but also has naproxen 500 mg twice daily as needed for joint pain.  She also has topical menthol gel ordered 4 times daily as needed.  Review of systems: Dementia invalidated responses. Date given as "December 20th".  She could not name the president but stated that he is "cutting up" because of the election. She had great difficulty with word retrieval.  For instance she could not remember if she had "applied for something".  After long pause she mentioned "disability".  She states she could not remember her mother had died.  She confabulates and rambles about exercising even though she is wheelchair-bound. She states that her right knee hurts when it is cold or damp.  She states this started when she hit her right knee last year on a door or a table; she cannot remember the nature of the insult.  She does validate that menthol topically helps.  She did not mention taking the NSAIDs.  She has occasional frontal headache which she will take Tylenol or let it resolve spontaneously.  She describes occasional sneezing but denies other extrinsic or pulmonary symptoms.  She states that her weight is "up-and-down".  Constitutional:  No fever,  fatigue  Eyes: No redness, discharge, pain, vision change ENT/mouth: No nasal congestion,  purulent discharge, earache, change in hearing, sore throat  Cardiovascular: No chest pain, palpitations, paroxysmal nocturnal dyspnea Respiratory: No cough (see exam), sputum production, hemoptysis,  significant snoring, apnea  Gastrointestinal: No heartburn, dysphagia, abdominal pain, nausea /vomiting, rectal bleeding, melena, change in bowels Genitourinary: No dysuria, hematuria, pyuria, incontinence, nocturia Dermatologic: No rash, pruritus, change in appearance of skin Neurologic: No dizziness, headache, syncope, seizures, numbness, tingling Psychiatric: No significant anxiety, depression, insomnia, anorexia Endocrine: No change in hair/skin/nails, excessive thirst, excessive hunger, excessive urination  Hematologic/lymphatic: No significant bruising, lymphadenopathy, abnormal bleeding Allergy/immunology: No itchy/watery eyes, urticaria, angioedema  Physical exam:  Pertinent or positive findings: Ptosis is present on the right.  Eyebrows are thin laterally.  The left nasolabial fold is decreased.  The posterior oropharyngeal area could not be visualized well.  Grade 1/2 systolic murmur is present at the base.  She has expiratory rhonchi and coughs intermittently with exhalation.  As noted she denied having significant pulmonary issues despite these findings.  Abdomen is protuberant.  There is central obesity.  Her legs are weak to opposition, profoundly so. She has 1/2+ pitting edema.  Only the right dorsalis pedis pulse is palpable.  There is hyperpigmented irregular keratotic lesion over the left shin.  This is proximal to an area of relative hypopigmentation with the appearance of a healed ulcer.  General appearance: Adequately nourished; no acute distress, increased work of breathing is present.   Lymphatic: No lymphadenopathy about the head, neck, axilla. Eyes: No conjunctival  inflammation or  lid edema is present. There is no scleral icterus. Ears:  External ear exam shows no significant lesions or deformities.   Nose:  External nasal examination shows no deformity or inflammation. Nasal mucosa are pink and moist without lesions, exudates Oral exam:  Lips and gums are healthy appearing. There is no oropharyngeal erythema or exudate. Neck:  No thyromegaly, masses, tenderness noted.    Heart:  Normal rate and regular rhythm. S1 and S2 normal without gallop,  click, rub .  Abdomen: Bowel sounds are normal. Abdomen is soft and nontender with no organomegaly, hernias, masses. GU: Deferred  Extremities:  No cyanosis, clubbing  Neurologic exam : Balance, Rhomberg, finger to nose testing could not be completed due to clinical state Skin: Warm & dry w/o tenting. No significant  rash.  See summary under each active problem in the Problem List with associated updated therapeutic plan

## 2019-06-27 NOTE — Assessment & Plan Note (Signed)
Endocrinology follow-up is scheduled 10/02/2019 with Dr.Gherghe

## 2019-06-27 NOTE — Assessment & Plan Note (Signed)
Endocrinology follow-up appointment March 2021.

## 2019-07-22 ENCOUNTER — Encounter: Payer: Self-pay | Admitting: Adult Health

## 2019-07-22 ENCOUNTER — Non-Acute Institutional Stay (SKILLED_NURSING_FACILITY): Payer: Medicare Other | Admitting: Adult Health

## 2019-07-22 DIAGNOSIS — I10 Essential (primary) hypertension: Secondary | ICD-10-CM

## 2019-07-22 DIAGNOSIS — E119 Type 2 diabetes mellitus without complications: Secondary | ICD-10-CM

## 2019-07-22 DIAGNOSIS — G40909 Epilepsy, unspecified, not intractable, without status epilepticus: Secondary | ICD-10-CM

## 2019-07-22 DIAGNOSIS — K219 Gastro-esophageal reflux disease without esophagitis: Secondary | ICD-10-CM

## 2019-07-22 NOTE — Progress Notes (Signed)
Location:  [facility] Room Number: 203/A Place of Service:  SNF (31) Provider:  Durenda Age, DNP, FNP-BC  Patient Care Team: Hendricks Limes, MD as PCP - General (Internal Medicine) Medina-Vargas, Senaida Lange, NP as Nurse Practitioner (Internal Medicine)  Extended Emergency Contact Information Primary Emergency Contact: Kaylise, Fauss Home Phone: [phone] Relation: Sister Secondary Emergency Contact: Sandria Manly Mobile Phone: [phone] Relation: Niece  Code Status:  Full Code  Goals of care: Advanced Directive information Advanced Directives 07/22/2019  Does Patient Have a Medical Advance Directive? Yes  Type of Advance Directive (No Data)  Does patient want to make changes to medical advance directive? No - Patient declined  Would patient like information on creating a medical advance directive? -  Pre-existing out of facility DNR order (yellow form or pink MOST form) -     Chief Complaint  Patient presents with  . Medical Management of Chronic Issues    Routine visit of medical management    HPI:  Mariah [name] is a 68 y.o. female seen today for medical management of chronic diseases.  She has PMH of CAD, diabetes mellitus and asthma.  BPs 164/84, 184/98, 136/53, 145/79.  She takes metoprolol tartrate and losartan for hypertension.  She denies headache nor dizziness.  She requested ginger ale for lunch and dinner.   Past Medical History:  Diagnosis Date  . Allergic rhinitis   . Arthritis    "ankles, feet, knees" (10/30/2017)  . Asthma   . Bursitis   . Carpal tunnel syndrome   . GERD (gastroesophageal reflux disease)   . HTN (hypertension)   . Migraine    "a couple/yr" (10/30/2017)  . Obesity   . Peripheral vascular disease ([place])   . Pneumonia 1960s X 1  . Sciatica   . Tendinitis   . Type II diabetes mellitus (HCC)    Type II  . UTI (urinary tract infection)    Past Surgical History:  Procedure Laterality Date  . CRANIOTOMY  N/A 01/19/2018   Procedure: ENDOSCOPIC TRANSPHENOIDAL RESECTION OF PITUITARY TUMOR;  Surgeon: Consuella Lose, MD;  Location: [facility];  Service: Neurosurgery;  Laterality: N/A;  ENDOSCOPIC TRANSPHENOIDAL RESECTION OF PITUITARY TUMOR  . GANGLION CYST EXCISION Left   . KNEE ARTHROSCOPY Left 2003  . PLANTAR FASCIA RELEASE Right 1995   "heel"  . SINUS ENDO WITH FUSION N/A 01/19/2018   Procedure: SINUS ENDO WITH FUSION;  Surgeon: Jerrell Belfast, MD;  Location: [facility];  Service: ENT;  Laterality: N/A;  . TONSILLECTOMY  1958  . TRANSPHENOIDAL APPROACH EXPOSURE N/A 01/19/2018   Procedure: TRANSPHENOIDAL  RESECTION OF PITUITARY TUMOR;  Surgeon: Jerrell Belfast, MD;  Location: [facility];  Service: ENT;  Laterality: N/A;  . [place]; 1997   Dr [name]    Allergies  Allergen Reactions  . Penicillins Hives and Other (See Comments)    ++ got keflex in 2013 and 2019++ Has patient had a PCN reaction causing immediate rash, facial/tongue/throat swelling, SOB or lightheadedness with hypotension: No Has patient had a PCN reaction causing severe rash involving mucus membranes or skin necrosis: No PATIENT HAS HAD A PCN REACTION THAT REQUIRED HOSPITALIZATION:  #  #  YES  #  #  Has patient had a PCN reaction occurring within the last 10 years: No   . Naproxen     History of GI intolerance with indomethacin.  She is on PPI for GERD.  NSAIDs should not be used long-term or as maintenance medication due  to the comorbidities and age as per [place]' List  . Codeine Nausea And Vomiting  . Indomethacin Diarrhea    Outpatient Encounter Medications as of 07/22/2019  Medication Sig  . acetaminophen (TYLENOL) 650 MG CR tablet Take 650 mg by mouth every 6 (six) hours as needed for pain.  [name] ADVAIR DISKUS 100-50 MCG/DOSE AEPB Inhale 1 puff into the lungs 2 (two) times daily.  [name] albuterol (PROVENTIL HFA;VENTOLIN HFA) 108 (90 Base) MCG/ACT inhaler Inhale 2 puffs into the lungs every 6 (six) hours as needed  for wheezing or shortness of breath.  . bisacodyl (DULCOLAX) 10 MG suppository Constipation (2 of 4): If not relieved by MOM, give 10 mg Bisacodyl suppositiory rectally X 1 dose in 24 hours as needed (Do not use constipation standing orders for residents with renal failure/CFR less than 30. Contact MD for orders)  . Carboxymeth-Glycerin-Polysorb (REFRESH OPTIVE ADVANCED OP) Place 1 drop into both eyes 4 (four) times daily.  . Cholecalciferol (VITAMIN D3) 125 MCG (5000 UT) CAPS Take 1 capsule by mouth daily.   [name] esomeprazole (NEXIUM) 20 MG capsule Take 1 capsule (20 mg total) by mouth every morning. For acid reflex  . furosemide (LASIX) 40 MG tablet TAKE ONE TABLET BY MOUTH EVERY DAY  . levETIRAcetam (KEPPRA) 500 MG tablet Take 1 tablet (500 mg total) by mouth 2 (two) times daily.  [name] losartan (COZAAR) 50 MG tablet Take 50 mg by mouth daily.  . magnesium hydroxide (MILK OF MAGNESIA) 400 MG/5ML suspension Constipation (1 of 4): If no BM in 3 days, give 30 cc Milk of Magnesium p.o. x 1 dose in 24 hours as needed (Do not use standing constipation orders for residents with renal failure CFR less than 30. Contact MD for orders)  . Menthol, Topical Analgesic, (BIOFREEZE) 4 % GEL Apply 1 application topically 4 (four) times daily. Apply to bilateral knees for osteoarthritis  . metFORMIN (GLUCOPHAGE) 500 MG tablet Take 500 mg by mouth 2 (two) times daily with a meal.  . metoprolol succinate (TOPROL-XL) 25 MG 24 hr tablet Take 25 mg by mouth 2 (two) times daily.   . mometasone (NASONEX) 50 MCG/ACT nasal spray Place 1 spray into the nose as needed.  . protective barrier (RESTORE) CREA Apply topically. Apply barrier cream to buttocks topically BID PRN  . [DISCONTINUED] lisinopril (PRINIVIL,ZESTRIL) 40 MG tablet Take 40 mg by mouth daily.   . [DISCONTINUED] naproxen (NAPROSYN) 500 MG tablet Take 500 mg by mouth 2 (two) times daily as needed for moderate pain (Joint pain). Take after meals   No  facility-administered encounter medications on file as of 07/22/2019.    Review of Systems  GENERAL: No change in appetite, no fatigue, no weight changes, no fever, chills or weakness MOUTH and THROAT: Denies oral discomfort, gingival pain or bleeding RESPIRATORY: no cough, SOB, DOE, wheezing, hemoptysis CARDIAC: No chest pain or palpitations GI: No abdominal pain, diarrhea, constipation, heart burn, nausea or vomiting GU: Denies dysuria, frequency, hematuria, incontinence, or discharge NEUROLOGICAL: Denies dizziness, syncope, numbness, or headache PSYCHIATRIC: Denies feelings of depression or anxiety. No report of hallucinations, insomnia, paranoia, or agitation   Immunization History  Administered Date(s) Administered  . Influenza Split 03/08/2013  . Influenza Whole 09/03/2009, 06/03/2010  . Influenza,inj,Quad PF,6+ Mos 06/25/2014  . Influenza-Unspecified 06/08/2017, 03/25/2018, 05/06/2019  . Pneumococcal Polysaccharide-23 07/25/2002, 10/31/2017  . Pneumococcal-Unspecified 05/07/2019  . Td 07/25/2005  . Tdap 05/03/2019   Pertinent  Health Maintenance Due  Topic Date Due  . COLONOSCOPY  09/02/2002  .  MAMMOGRAM  01/14/2017  . DEXA SCAN  09/02/2017  . HEMOGLOBIN A1C  05/09/2019  . OPHTHALMOLOGY EXAM  09/08/2019  . FOOT EXAM  10/09/2019  . PNA vac Low Risk Adult (2 of 2 - PCV13) 05/06/2020  . INFLUENZA VACCINE  Completed   Fall Risk  02/21/2019 03/06/2018 10/20/2017 09/01/2017 08/15/2017  Falls in the past year? (No Data) Yes Yes Yes Yes  Comment Emmi Telephone Survey: data to providers prior to load - - - -  Number falls in past yr: (No Data) 1 2 or more 2 or more 1  Comment Emmi Telephone Survey Actual Response =  - - 7 times  -  Injury with Fall? - Yes Yes No No  Comment - - Broken jaw - -  Risk Factor Category  - High Fall Risk High Fall Risk High Fall Risk High Fall Risk  Risk for fall due to : - Impaired balance/gait History of fall(s);Impaired mobility;Impaired  balance/gait Impaired balance/gait;Impaired mobility Impaired balance/gait;Impaired mobility  Follow up - Falls prevention discussed - Falls prevention discussed Education provided     Vitals:   07/22/19 0905  BP: (!) 164/84  Pulse: 80  Resp: 19  Temp: 98.9 F (37.2 C)  TempSrc: Oral  SpO2: 99%  Weight: 191 lb 12.8 oz (87 kg)  Height: 5\' 1"  (1.549 m)   Body mass index is 36.24 kg/m.  Physical Exam  GENERAL APPEARANCE: Well nourished. In no acute distress. Obese SKIN:  Skin is warm and dry.  MOUTH and THROAT: Lips are without lesions. Oral mucosa is moist and without lesions. Tongue is normal in shape, size, and color and without lesions RESPIRATORY: Breathing is even & unlabored, BS CTAB CARDIAC: RRR, no murmur,no extra heart sounds, BLE1+ edema GI: Abdomen soft, normal BS, no masses, no tenderness EXTREMITIES:  Able to move X 4 extremities NEUROLOGICAL: There is no tremor. Speech is clear. Alert to self, disoriented to time and place. PSYCHIATRIC: Affect and behavior are appropriate  Labs reviewed: Recent Labs    09/06/18 0000  NA 138  K 4.3  BUN 16  CREATININE 1.2*    Recent Labs    09/06/18 0000 09/28/18 0000  WBC 14.5 13.6  NEUTROABS 11 10  HGB 10.7* 10.7*  HCT 32* 32*  PLT 422* 412*   Lab Results  Component Value Date   TSH 1.89 10/02/2018   Lab Results  Component Value Date   HGBA1C 5.8 02/06/2019   Lab Results  Component Value Date   CHOL 178 08/30/2012   HDL 49 08/30/2012   LDLCALC 92 08/30/2012   TRIG 187 (H) 08/30/2012   CHOLHDL 3.6 08/30/2012    Assessment/Plan  1. Essential hypertension - BPs elevated, will increase losartan from 50 mg daily to 75 mg daily, monitor BPs -Continue metoprolol tartrate  2. Seizure disorder ([place]) -No recent seizures, continue Keppra  3. Type 2 diabetes mellitus treated without insulin (HCC) Lab Results  Component Value Date   HGBA1C 5.8 02/06/2019  -Continue Metformin  4. Gastroesophageal  reflux disease without esophagitis -Stable, continue esomeprazole     Family/ staff Communication: Discussed plan of care with resident.  Labs/tests ordered: None  Goals of care:   Long-term care   Durenda Age, DNP, FNP-BC [facility] [phone] (Monday-Friday 8:00 a.m. - 5:00 p.m.) [phone] (after hours)

## 2019-08-12 DIAGNOSIS — Z23 Encounter for immunization: Secondary | ICD-10-CM | POA: Diagnosis not present

## 2019-08-13 DIAGNOSIS — R41841 Cognitive communication deficit: Secondary | ICD-10-CM | POA: Diagnosis not present

## 2019-08-13 DIAGNOSIS — Z9181 History of falling: Secondary | ICD-10-CM | POA: Diagnosis not present

## 2019-08-14 ENCOUNTER — Encounter: Payer: Self-pay | Admitting: Adult Health

## 2019-08-14 DIAGNOSIS — R41841 Cognitive communication deficit: Secondary | ICD-10-CM | POA: Diagnosis not present

## 2019-08-14 DIAGNOSIS — Z9181 History of falling: Secondary | ICD-10-CM | POA: Diagnosis not present

## 2019-08-14 NOTE — Progress Notes (Signed)
Location:  [place] Room Number: 203/A Place of Service:  SNF (31) Provider:  Durenda Age, DNP, FNP-BC  Patient Care Team: Hendricks Limes, MD as PCP - General (Internal Medicine) Medina-Vargas, Senaida Lange, NP as Nurse Practitioner (Internal Medicine)  Extended Emergency Contact Information Primary Emergency Contact: Soriya, Ashlin Home Phone: [phone] Relation: Sister Secondary Emergency Contact: Sandria Manly Mobile Phone: [phone] Relation: Niece  Code Status:  Full Code  Goals of care: Advanced Directive information Advanced Directives 08/14/2019  Does Patient Have a Medical Advance Directive? Yes  Type of Advance Directive (No Data)  Does patient want to make changes to medical advance directive? No - Patient declined  Would patient like information on creating a medical advance directive? -  Pre-existing out of facility DNR order (yellow form or pink MOST form) -     Chief Complaint  Patient presents with  . Medical Management of Chronic Issues    Routine visit of medical management  . Best Practice Recommendations    Hemaglobin A1C    HPI:  Pt is a 69 y.o. female seen today for medical management of chronic diseases.     Past Medical History:  Diagnosis Date  . Allergic rhinitis   . Arthritis    "ankles, feet, knees" (10/30/2017)  . Asthma   . Bursitis   . Carpal tunnel syndrome   . GERD (gastroesophageal reflux disease)   . HTN (hypertension)   . Migraine    "a couple/yr" (10/30/2017)  . Obesity   . Peripheral vascular disease ([place])   . Pneumonia 1960s X 1  . Sciatica   . Tendinitis   . Type II diabetes mellitus (HCC)    Type II  . UTI (urinary tract infection)    Past Surgical History:  Procedure Laterality Date  . CRANIOTOMY N/A 01/19/2018   Procedure: ENDOSCOPIC TRANSPHENOIDAL RESECTION OF PITUITARY TUMOR;  Surgeon: Consuella Lose, MD;  Location: [place];  Service: Neurosurgery;  Laterality: N/A;   ENDOSCOPIC TRANSPHENOIDAL RESECTION OF PITUITARY TUMOR  . GANGLION CYST EXCISION Left   . KNEE ARTHROSCOPY Left 2003  . PLANTAR FASCIA RELEASE Right 1995   "heel"  . SINUS ENDO WITH FUSION N/A 01/19/2018   Procedure: SINUS ENDO WITH FUSION;  Surgeon: Jerrell Belfast, MD;  Location: [place];  Service: ENT;  Laterality: N/A;  . TONSILLECTOMY  1958  . TRANSPHENOIDAL APPROACH EXPOSURE N/A 01/19/2018   Procedure: TRANSPHENOIDAL  RESECTION OF PITUITARY TUMOR;  Surgeon: Jerrell Belfast, MD;  Location: [place];  Service: ENT;  Laterality: N/A;  . [place]; 1997   Dr [name]    Allergies  Allergen Reactions  . Penicillins Hives and Other (See Comments)    ++ got keflex in 2013 and 2019++ Has patient had a PCN reaction causing immediate rash, facial/tongue/throat swelling, SOB or lightheadedness with hypotension: No Has patient had a PCN reaction causing severe rash involving mucus membranes or skin necrosis: No PATIENT HAS HAD A PCN REACTION THAT REQUIRED HOSPITALIZATION:  #  #  YES  #  #  Has patient had a PCN reaction occurring within the last 10 years: No   . Naproxen     History of GI intolerance with indomethacin.  She is on PPI for GERD.  NSAIDs should not be used long-term or as maintenance medication due to the comorbidities and age as per Beers' List  . Codeine Nausea And Vomiting  . Indomethacin Diarrhea    Outpatient Encounter Medications as of 08/14/2019  Medication Sig  . acetaminophen (TYLENOL) 650 MG CR tablet Take 650 mg by mouth every 6 (six) hours as needed for pain.  [name] ADVAIR DISKUS 100-50 MCG/DOSE AEPB Inhale 1 puff into the lungs 2 (two) times daily.  [name] albuterol (PROVENTIL HFA;VENTOLIN HFA) 108 (90 Base) MCG/ACT inhaler Inhale 2 puffs into the lungs every 6 (six) hours as needed for wheezing or shortness of breath.  . bisacodyl (DULCOLAX) 10 MG suppository Constipation (2 of 4): If not relieved by MOM, give 10 mg Bisacodyl suppositiory rectally X 1 dose in  24 hours as needed (Do not use constipation standing orders for residents with renal failure/CFR less than 30. Contact MD for orders)  . Carboxymeth-Glycerin-Polysorb (REFRESH OPTIVE ADVANCED OP) Place 1 drop into both eyes 4 (four) times daily.  . cetaphil (CETAPHIL) cream APPLY CREAM TOPICALLY TO BILATERAL LOWER LEGS AT BEDTIME DX: DRY SKIN  . Cholecalciferol (VITAMIN D3) 125 MCG (5000 UT) CAPS Take 1 capsule by mouth daily.   [name] esomeprazole (NEXIUM) 20 MG capsule Take 1 capsule (20 mg total) by mouth every morning. For acid reflex  . furosemide (LASIX) 40 MG tablet TAKE ONE TABLET BY MOUTH EVERY DAY  . levETIRAcetam (KEPPRA) 500 MG tablet Take 1 tablet (500 mg total) by mouth 2 (two) times daily.  [name] losartan (COZAAR) 50 MG tablet Take 7.5 mg by mouth daily.   . magnesium hydroxide (MILK OF MAGNESIA) 400 MG/5ML suspension Constipation (1 of 4): If no BM in 3 days, give 30 cc Milk of Magnesium p.o. x 1 dose in 24 hours as needed (Do not use standing constipation orders for residents with renal failure CFR less than 30. Contact MD for orders)  . Menthol, Topical Analgesic, (BIOFREEZE) 4 % GEL Apply 1 application topically 4 (four) times daily. Apply to bilateral knees for osteoarthritis  . metFORMIN (GLUCOPHAGE) 500 MG tablet Take 500 mg by mouth 2 (two) times daily with a meal.  . metoprolol succinate (TOPROL-XL) 25 MG 24 hr tablet Take 25 mg by mouth 2 (two) times daily.   . mometasone (NASONEX) 50 MCG/ACT nasal spray Place 1 spray into the nose as needed.  . protective barrier (RESTORE) CREA Apply topically. Apply barrier cream to buttocks topically BID PRN   No facility-administered encounter medications on file as of 08/14/2019.    Review of Systems  GENERAL: No change in appetite, no fatigue, no weight changes, no fever, chills or weakness SKIN: Denies rash, itching, wounds, ulcer sores, or nail abnormalities EYES: Denies change in vision, dry eyes, eye pain, itching or discharge EARS:  Denies change in hearing, ringing in ears, or earache NOSE: Denies nasal congestion or epistaxis MOUTH and THROAT: Denies oral discomfort, gingival pain or bleeding, pain from teeth or hoarseness   RESPIRATORY: no cough, SOB, DOE, wheezing, hemoptysis CARDIAC: No chest pain, edema or palpitations GI: No abdominal pain, diarrhea, constipation, heart burn, nausea or vomiting GU: Denies dysuria, frequency, hematuria, incontinence, or discharge MUSCULOSKELETAL: Denies joint pain, muscle pain, back pain, restricted movement, or unusual weakness CIRCULATION: Denies claudication, edema of legs, varicosities, or cold extremities NEUROLOGICAL: Denies dizziness, syncope, numbness, or headache PSYCHIATRIC: Denies feelings of depression or anxiety. No report of hallucinations, insomnia, paranoia, or agitation ENDOCRINE: Denies polyphagia, polyuria, polydipsia, heat or cold intolerance HEME/LYMPH: Denies excessive bruising, petechia, enlarged lymph nodes, or bleeding problems IMMUNOLOGIC: Denies history of frequent infections, AIDS, or use of immunosuppressive agents   Immunization History  Administered Date(s) Administered  . Influenza Split 03/08/2013  . Influenza Whole  09/03/2009, 06/03/2010  . Influenza,inj,Quad PF,6+ Mos 06/25/2014  . Influenza-Unspecified 06/08/2017, 03/25/2018, 05/06/2019  . Pneumococcal Polysaccharide-23 07/25/2002, 10/31/2017  . Pneumococcal-Unspecified 05/07/2019  . Td 07/25/2005  . Tdap 05/01/2019   Pertinent  Health Maintenance Due  Topic Date Due  . COLONOSCOPY  09/02/2002  . MAMMOGRAM  01/14/2017  . DEXA SCAN  09/02/2017  . HEMOGLOBIN A1C  05/09/2019  . OPHTHALMOLOGY EXAM  09/08/2019  . FOOT EXAM  10/09/2019  . PNA vac Low Risk Adult (2 of 2 - PCV13) 05/06/2020  . INFLUENZA VACCINE  Completed   Fall Risk  02/21/2019 03/06/2018 10/20/2017 09/01/2017 08/15/2017  Falls in the past year? (No Data) Yes Yes Yes Yes  Comment Emmi Telephone Survey: data to providers prior  to load - - - -  Number falls in past yr: (No Data) 1 2 or more 2 or more 1  Comment Emmi Telephone Survey Actual Response =  - - 7 times  -  Injury with Fall? - Yes Yes No No  Comment - - Broken jaw - -  Risk Factor Category  - High Fall Risk High Fall Risk High Fall Risk High Fall Risk  Risk for fall due to : - Impaired balance/gait History of fall(s);Impaired mobility;Impaired balance/gait Impaired balance/gait;Impaired mobility Impaired balance/gait;Impaired mobility  Follow up - Falls prevention discussed - Falls prevention discussed Education provided     Vitals:   08/14/19 0950  BP: (!) 157/81  Pulse: 79  Resp: 20  Temp: 98.3 F (36.8 C)  TempSrc: Oral  SpO2: 99%  Weight: 187 lb 6.4 oz (85 kg)  Height: 5\' 1"  (1.549 m)   Body mass index is 35.41 kg/m.  Physical Exam  GENERAL APPEARANCE: Well nourished. In no acute distress. Normal body habitus SKIN:  Skin is warm and dry. There are no suspicious lesions or rash HEAD: Normal in size and contour. No evidence of trauma EYES: Lids open and close normally. No blepharitis, entropion or ectropion. PERRL. Conjunctivae are clear and sclerae are white. Lenses are without opacity EARS: Pinnae are normal. Patient hears normal voice tunes of the examiner MOUTH and THROAT: Lips are without lesions. Oral mucosa is moist and without lesions. Tongue is normal in shape, size, and color and without lesions NECK: supple, trachea midline, no neck masses, no thyroid tenderness, no thyromegaly LYMPHATICS: No LAN in the neck, no supraclavicular LAN RESPIRATORY: Breathing is even & unlabored, BS CTAB CARDIAC: RRR, no murmur,no extra heart sounds, no edema GI: Abdomen soft, normal BS, no masses, no tenderness, no hepatomegaly, no splenomegaly MUSCULOSKELETAL: No deformities. Movement at each extremity is full and painless. Strength is 5/5 at each extremity. Back is without kyphosis or scoliosis CIRCULATION: Pedal pulses are 2+. There is no edema  of the legs, ankles and feet NEUROLOGICAL: There is no tremor. Speech is clear PSYCHIATRIC: Alert and oriented X 3. Affect and behavior are appropriate  Labs reviewed: Recent Labs    09/06/18 0000  NA 138  K 4.3  BUN 16  CREATININE 1.2*   No results for input(s): AST, ALT, ALKPHOS, BILITOT, PROT, ALBUMIN in the last 8760 hours. Recent Labs    09/06/18 0000 09/28/18 0000  WBC 14.5 13.6  NEUTROABS 11 10  HGB 10.7* 10.7*  HCT 32* 32*  PLT 422* 412*   Lab Results  Component Value Date   TSH 1.89 10/02/2018   Lab Results  Component Value Date   HGBA1C 5.8 02/06/2019   Lab Results  Component Value Date   CHOL  178 08/30/2012   HDL 49 08/30/2012   LDLCALC 92 08/30/2012   TRIG 187 (H) 08/30/2012   CHOLHDL 3.6 08/30/2012    Significant Diagnostic Results in last 30 days:  No results found.  Assessment/Plan    Family/ staff Communication:   Labs/tests ordered:    Goals of care:      Durenda Age, DNP, FNP-BC [facility] [phone] (Monday-Friday 8:00 a.m. - 5:00 p.m.) [phone] (after hours)  This encounter was created in error - please disregard.

## 2019-08-15 ENCOUNTER — Encounter: Payer: Self-pay | Admitting: Adult Health

## 2019-08-15 ENCOUNTER — Non-Acute Institutional Stay (SKILLED_NURSING_FACILITY): Payer: Medicare Other | Admitting: Adult Health

## 2019-08-15 DIAGNOSIS — K219 Gastro-esophageal reflux disease without esophagitis: Secondary | ICD-10-CM

## 2019-08-15 DIAGNOSIS — H04123 Dry eye syndrome of bilateral lacrimal glands: Secondary | ICD-10-CM | POA: Diagnosis not present

## 2019-08-15 DIAGNOSIS — E119 Type 2 diabetes mellitus without complications: Secondary | ICD-10-CM | POA: Diagnosis not present

## 2019-08-15 DIAGNOSIS — M170 Bilateral primary osteoarthritis of knee: Secondary | ICD-10-CM

## 2019-08-15 DIAGNOSIS — G40909 Epilepsy, unspecified, not intractable, without status epilepticus: Secondary | ICD-10-CM

## 2019-08-15 DIAGNOSIS — I10 Essential (primary) hypertension: Secondary | ICD-10-CM

## 2019-08-15 NOTE — Progress Notes (Signed)
Location:  [place] Room Number: 203/A Place of Service:  SNF (31) Provider:  Durenda Age, DNP, FNP-BC  Patient Care Team: Hendricks Limes, MD as PCP - General (Internal Medicine) Medina-Vargas, Senaida Lange, NP as Nurse Practitioner (Internal Medicine)  Extended Emergency Contact Information Primary Emergency Contact: [place], Sheehy Home Phone: [phone] Relation: Sister Secondary Emergency Contact: Sandria Manly Mobile Phone: [phone] Relation: Niece  Code Status:  Full Code  Goals of care: Advanced Directive information Advanced Directives 08/14/2019  Does Patient Have a Medical Advance Directive? Yes  Type of Advance Directive (No Data)  Does patient want to make changes to medical advance directive? No - Patient declined  Would patient like information on creating a medical advance directive? -  Pre-existing out of facility DNR order (yellow form or pink MOST form) -     Chief Complaint  Patient presents with  . Medical Management of Chronic Issues    Routine visit of medical management    HPI:  Pt is a 69 y.o. female seen today for medical management of chronic diseases. She has PMH of CAD, diabetes mellitus and asthma.  No reported seizures.  She takes Keppra for seizures.  CBGs 157, 101, 98, 118, 97, 101.  She takes Metformin for diabetes mellitus.  She denies eye pain and continues to take refresh of the eyedrops for dry eyes.   Past Medical History:  Diagnosis Date  . Allergic rhinitis   . Arthritis    "ankles, feet, knees" (10/30/2017)  . Asthma   . Bursitis   . Carpal tunnel syndrome   . GERD (gastroesophageal reflux disease)   . HTN (hypertension)   . Migraine    "a couple/yr" (10/30/2017)  . Obesity   . Peripheral vascular disease ([place])   . Pneumonia 1960s X 1  . Sciatica   . Tendinitis   . Type II diabetes mellitus (HCC)    Type II  . UTI (urinary tract infection)    Past Surgical History:  Procedure  Laterality Date  . CRANIOTOMY N/A 01/19/2018   Procedure: ENDOSCOPIC TRANSPHENOIDAL RESECTION OF PITUITARY TUMOR;  Surgeon: Consuella Lose, MD;  Location: [place];  Service: Neurosurgery;  Laterality: N/A;  ENDOSCOPIC TRANSPHENOIDAL RESECTION OF PITUITARY TUMOR  . GANGLION CYST EXCISION Left   . KNEE ARTHROSCOPY Left 2003  . PLANTAR FASCIA RELEASE Right 1995   "heel"  . SINUS ENDO WITH FUSION N/A 01/19/2018   Procedure: SINUS ENDO WITH FUSION;  Surgeon: Jerrell Belfast, MD;  Location: [place];  Service: ENT;  Laterality: N/A;  . TONSILLECTOMY  1958  . TRANSPHENOIDAL APPROACH EXPOSURE N/A 01/19/2018   Procedure: TRANSPHENOIDAL  RESECTION OF PITUITARY TUMOR;  Surgeon: Jerrell Belfast, MD;  Location: [place];  Service: ENT;  Laterality: N/A;  . [place]; 1997   Dr [name]    Allergies  Allergen Reactions  . Penicillins Hives and Other (See Comments)    ++ got keflex in 2013 and 2019++ Has patient had a PCN reaction causing immediate rash, facial/tongue/throat swelling, SOB or lightheadedness with hypotension: No Has patient had a PCN reaction causing severe rash involving mucus membranes or skin necrosis: No PATIENT HAS HAD A PCN REACTION THAT REQUIRED HOSPITALIZATION:  #  #  YES  #  #  Has patient had a PCN reaction occurring within the last 10 years: No   . Naproxen     History of GI intolerance with indomethacin.  She is on PPI for GERD.  NSAIDs  should not be used long-term or as maintenance medication due to the comorbidities and age as per Beers' List  . Codeine Nausea And Vomiting  . Indomethacin Diarrhea    Outpatient Encounter Medications as of 08/15/2019  Medication Sig  . acetaminophen (TYLENOL) 650 MG CR tablet Take 650 mg by mouth every 6 (six) hours as needed for pain.  [name] ADVAIR DISKUS 100-50 MCG/DOSE AEPB Inhale 1 puff into the lungs 2 (two) times daily.  [name] albuterol (PROVENTIL HFA;VENTOLIN HFA) 108 (90 Base) MCG/ACT inhaler Inhale 2 puffs into the lungs  every 6 (six) hours as needed for wheezing or shortness of breath.  . bisacodyl (DULCOLAX) 10 MG suppository Constipation (2 of 4): If not relieved by MOM, give 10 mg Bisacodyl suppositiory rectally X 1 dose in 24 hours as needed (Do not use constipation standing orders for residents with renal failure/CFR less than 30. Contact MD for orders)  . Carboxymeth-Glycerin-Polysorb (REFRESH OPTIVE ADVANCED OP) Place 1 drop into both eyes 4 (four) times daily.  . cetaphil (CETAPHIL) cream APPLY CREAM TOPICALLY TO BILATERAL LOWER LEGS AT BEDTIME DX: DRY SKIN  . Cholecalciferol (VITAMIN D3) 125 MCG (5000 UT) CAPS Take 1 capsule by mouth daily.   [name] esomeprazole (NEXIUM) 20 MG capsule Take 1 capsule (20 mg total) by mouth every morning. For acid reflex  . furosemide (LASIX) 40 MG tablet TAKE ONE TABLET BY MOUTH EVERY DAY  . levETIRAcetam (KEPPRA) 500 MG tablet Take 1 tablet (500 mg total) by mouth 2 (two) times daily.  [name] losartan (COZAAR) 50 MG tablet Take 7.5 mg by mouth daily.   . magnesium hydroxide (MILK OF MAGNESIA) 400 MG/5ML suspension Constipation (1 of 4): If no BM in 3 days, give 30 cc Milk of Magnesium p.o. x 1 dose in 24 hours as needed (Do not use standing constipation orders for residents with renal failure CFR less than 30. Contact MD for orders)  . Menthol, Topical Analgesic, (BIOFREEZE) 4 % GEL Apply 1 application topically 4 (four) times daily. Apply to bilateral knees for osteoarthritis  . metFORMIN (GLUCOPHAGE) 500 MG tablet Take 500 mg by mouth 2 (two) times daily with a meal.  . metoprolol succinate (TOPROL-XL) 25 MG 24 hr tablet Take 25 mg by mouth 2 (two) times daily.   . mometasone (NASONEX) 50 MCG/ACT nasal spray Place 1 spray into the nose as needed.  . protective barrier (RESTORE) CREA Apply topically. Apply barrier cream to buttocks topically BID PRN   No facility-administered encounter medications on file as of 08/15/2019.    Review of Systems  GENERAL: No change in appetite, no  fatigue, no weight changes, no fever, chills or weakness MOUTH and THROAT: Denies oral discomfort, gingival pain or bleeding RESPIRATORY: no cough, SOB, DOE, wheezing, hemoptysis CARDIAC: No chest pain, edema or palpitations GI: No abdominal pain, diarrhea, constipation, heart burn, nausea or vomiting GU: Denies dysuria, frequency, hematuria, incontinence, or discharge NEUROLOGICAL: Denies dizziness, syncope, numbness, or headache PSYCHIATRIC: Denies feelings of depression or anxiety. No report of hallucinations, insomnia, paranoia, or agitation   Immunization History  Administered Date(s) Administered  . Influenza Split 03/08/2013  . Influenza Whole 09/03/2009, 06/03/2010  . Influenza,inj,Quad PF,6+ Mos 06/25/2014  . Influenza-Unspecified 06/08/2017, 03/25/2018, 05/06/2019  . Pneumococcal Polysaccharide-23 07/25/2002, 10/31/2017  . Pneumococcal-Unspecified 05/07/2019  . Td 07/25/2005  . Tdap 05/01/2019   Pertinent  Health Maintenance Due  Topic Date Due  . COLONOSCOPY  09/02/2002  . MAMMOGRAM  01/14/2017  . DEXA SCAN  09/02/2017  .  HEMOGLOBIN A1C  05/09/2019  . OPHTHALMOLOGY EXAM  09/08/2019  . FOOT EXAM  10/09/2019  . PNA vac Low Risk Adult (2 of 2 - PCV13) 05/06/2020  . INFLUENZA VACCINE  Completed   Fall Risk  02/21/2019 03/06/2018 10/20/2017 09/01/2017 08/15/2017  Falls in the past year? (No Data) Yes Yes Yes Yes  Comment Emmi Telephone Survey: data to providers prior to load - - - -  Number falls in past yr: (No Data) 1 2 or more 2 or more 1  Comment Emmi Telephone Survey Actual Response =  - - 7 times  -  Injury with Fall? - Yes Yes No No  Comment - - Broken jaw - -  Risk Factor Category  - High Fall Risk High Fall Risk High Fall Risk High Fall Risk  Risk for fall due to : - Impaired balance/gait History of fall(s);Impaired mobility;Impaired balance/gait Impaired balance/gait;Impaired mobility Impaired balance/gait;Impaired mobility  Follow up - Falls prevention discussed -  Falls prevention discussed Education provided     Vitals:   08/15/19 1022  BP: (!) 148/79  Pulse: 84  Resp: 20  Temp: 97.7 F (36.5 C)  TempSrc: Oral  SpO2: 99%  Weight: 187 lb 6.4 oz (85 kg)  Height: 5\' 1"  (1.549 m)   Body mass index is 35.41 kg/m.  Physical Exam  GENERAL APPEARANCE: Well nourished. In no acute distress.  Obese SKIN:  Skin is warm and dry.  MOUTH and THROAT: Lips are without lesions. Oral mucosa is moist and without lesions. Tongue is normal in shape, size, and color and without lesions RESPIRATORY: Breathing is even & unlabored, BS CTAB CARDIAC: RRR, no murmur,no extra heart sounds, no edema GI: Abdomen soft, normal BS, no masses, no tenderness NEUROLOGICAL: There is no tremor. Speech is clear. Alert and oriented X 3.  PSYCHIATRIC: Affect and behavior are appropriate  Labs reviewed: Recent Labs    09/06/18 0000  NA 138  K 4.3  BUN 16  CREATININE 1.2*    Recent Labs    09/06/18 0000 09/28/18 0000  WBC 14.5 13.6  NEUTROABS 11 10  HGB 10.7* 10.7*  HCT 32* 32*  PLT 422* 412*   Lab Results  Component Value Date   TSH 1.89 10/02/2018   Lab Results  Component Value Date   HGBA1C 5.8 02/06/2019   Lab Results  Component Value Date   CHOL 178 08/30/2012   HDL 49 08/30/2012   LDLCALC 92 08/30/2012   TRIG 187 (H) 08/30/2012   CHOLHDL 3.6 08/30/2012    Assessment/Plan  1. Seizure disorder ([place]) -No reported seizures, continue Keppra, seizure precautions  2. Type 2 diabetes mellitus treated without insulin ([facility]) Lab Results  Component Value Date   HGBA1C 5.8 02/06/2019  -CBGs stable, continue Metformin  3. Primary osteoarthritis of both knees - she is sometimes seen standing in one position before stepping forward, will continue MAPAP arthritis and Biofreeze 4% gel topically  4. Essential hypertension -Stable, continue metoprolol tartrate and losartan  5. Dry eyes - denies eye pain nor change in vision, continue refresh  eyedrops  6. Gastroesophageal reflux disease without esophagitis -Stable, continue esomeprazole    Family/ staff Communication: Discussed plan of care with resident.  Labs/tests ordered: None  Goals of care:   Long-term care   Durenda Age, DNP, FNP-BC [facility] [phone] (Monday-Friday 8:00 a.m. - 5:00 p.m.) [phone] (after hours)

## 2019-08-15 NOTE — Progress Notes (Signed)
This encounter was created in error - please disregard.

## 2019-08-16 DIAGNOSIS — R41841 Cognitive communication deficit: Secondary | ICD-10-CM | POA: Diagnosis not present

## 2019-08-16 DIAGNOSIS — Z9181 History of falling: Secondary | ICD-10-CM | POA: Diagnosis not present

## 2019-08-19 DIAGNOSIS — R41841 Cognitive communication deficit: Secondary | ICD-10-CM | POA: Diagnosis not present

## 2019-08-19 DIAGNOSIS — Z9181 History of falling: Secondary | ICD-10-CM | POA: Diagnosis not present

## 2019-08-20 DIAGNOSIS — Z9181 History of falling: Secondary | ICD-10-CM | POA: Diagnosis not present

## 2019-08-20 DIAGNOSIS — R41841 Cognitive communication deficit: Secondary | ICD-10-CM | POA: Diagnosis not present

## 2019-08-21 DIAGNOSIS — R41841 Cognitive communication deficit: Secondary | ICD-10-CM | POA: Diagnosis not present

## 2019-08-21 DIAGNOSIS — Z9181 History of falling: Secondary | ICD-10-CM | POA: Diagnosis not present

## 2019-08-22 DIAGNOSIS — Z9181 History of falling: Secondary | ICD-10-CM | POA: Diagnosis not present

## 2019-08-22 DIAGNOSIS — R41841 Cognitive communication deficit: Secondary | ICD-10-CM | POA: Diagnosis not present

## 2019-08-23 DIAGNOSIS — Z9181 History of falling: Secondary | ICD-10-CM | POA: Diagnosis not present

## 2019-08-23 DIAGNOSIS — R41841 Cognitive communication deficit: Secondary | ICD-10-CM | POA: Diagnosis not present

## 2019-08-26 DIAGNOSIS — Z9181 History of falling: Secondary | ICD-10-CM | POA: Diagnosis not present

## 2019-08-26 DIAGNOSIS — R41841 Cognitive communication deficit: Secondary | ICD-10-CM | POA: Diagnosis not present

## 2019-08-26 DIAGNOSIS — G9340 Encephalopathy, unspecified: Secondary | ICD-10-CM | POA: Diagnosis not present

## 2019-08-27 DIAGNOSIS — R41841 Cognitive communication deficit: Secondary | ICD-10-CM | POA: Diagnosis not present

## 2019-08-27 DIAGNOSIS — G9340 Encephalopathy, unspecified: Secondary | ICD-10-CM | POA: Diagnosis not present

## 2019-08-27 DIAGNOSIS — Z9181 History of falling: Secondary | ICD-10-CM | POA: Diagnosis not present

## 2019-09-04 ENCOUNTER — Encounter: Payer: Self-pay | Admitting: Adult Health

## 2019-09-04 ENCOUNTER — Non-Acute Institutional Stay (SKILLED_NURSING_FACILITY): Payer: Medicare Other | Admitting: Adult Health

## 2019-09-04 DIAGNOSIS — R600 Localized edema: Secondary | ICD-10-CM | POA: Diagnosis not present

## 2019-09-04 DIAGNOSIS — G40909 Epilepsy, unspecified, not intractable, without status epilepticus: Secondary | ICD-10-CM

## 2019-09-04 DIAGNOSIS — E119 Type 2 diabetes mellitus without complications: Secondary | ICD-10-CM | POA: Diagnosis not present

## 2019-09-04 DIAGNOSIS — I10 Essential (primary) hypertension: Secondary | ICD-10-CM | POA: Diagnosis not present

## 2019-09-04 NOTE — Progress Notes (Addendum)
Location:  [place] Room Number: 203/A Place of Service:  SNF (31) Provider:  Durenda Age, DNP, FNP-BC  Patient Care Team: Hendricks Limes, MD as PCP - General (Internal Medicine) Medina-Vargas, Senaida Lange, NP as Nurse Practitioner (Internal Medicine)  Extended Emergency Contact Information Primary Emergency Contact: Yashira, Kuhrt Home Phone: [phone] Relation: Sister Secondary Emergency Contact: Sandria Manly Mobile Phone: [phone] Relation: Niece  Code Status:  Full Code  Goals of care: Advanced Directive information Advanced Directives 09/04/2019  Does Patient Have a Medical Advance Directive? Yes  Type of Advance Directive (No Data)  Does patient want to make changes to medical advance directive? No - Patient declined  Would patient like information on creating a medical advance directive? -  Pre-existing out of facility DNR order (yellow form or pink MOST form) -     Chief Complaint  Patient presents with  . Medical Management of Chronic Issues    Routine visit of medical management    HPI:  Pt is a 69 y.o. female seen today for medical management of chronic diseases.  She has PMH of CAD, diabetes mellitus and asthma.  She was seen in her room today.  She requested to have ginger ale for breakfast and dinner trays.   Past Medical History:  Diagnosis Date  . Allergic rhinitis   . Arthritis    "ankles, feet, knees" (10/30/2017)  . Asthma   . Bursitis   . Carpal tunnel syndrome   . GERD (gastroesophageal reflux disease)   . HTN (hypertension)   . Migraine    "a couple/yr" (10/30/2017)  . Obesity   . Peripheral vascular disease ([place])   . Pneumonia 1960s X 1  . Sciatica   . Tendinitis   . Type II diabetes mellitus (HCC)    Type II  . UTI (urinary tract infection)    Past Surgical History:  Procedure Laterality Date  . CRANIOTOMY N/A 01/19/2018   Procedure: ENDOSCOPIC TRANSPHENOIDAL RESECTION OF PITUITARY TUMOR;   Surgeon: Consuella Lose, MD;  Location: [facility];  Service: Neurosurgery;  Laterality: N/A;  ENDOSCOPIC TRANSPHENOIDAL RESECTION OF PITUITARY TUMOR  . GANGLION CYST EXCISION Left   . KNEE ARTHROSCOPY Left 2003  . PLANTAR FASCIA RELEASE Right 1995   "heel"  . SINUS ENDO WITH FUSION N/A 01/19/2018   Procedure: SINUS ENDO WITH FUSION;  Surgeon: Jerrell Belfast, MD;  Location: [facility];  Service: ENT;  Laterality: N/A;  . TONSILLECTOMY  1958  . TRANSPHENOIDAL APPROACH EXPOSURE N/A 01/19/2018   Procedure: TRANSPHENOIDAL  RESECTION OF PITUITARY TUMOR;  Surgeon: Jerrell Belfast, MD;  Location: [facility];  Service: ENT;  Laterality: N/A;  . [place]; 1997   Dr [name]    Allergies  Allergen Reactions  . Penicillins Hives and Other (See Comments)    ++ got keflex in 2013 and 2019++ Has patient had a PCN reaction causing immediate rash, facial/tongue/throat swelling, SOB or lightheadedness with hypotension: No Has patient had a PCN reaction causing severe rash involving mucus membranes or skin necrosis: No PATIENT HAS HAD A PCN REACTION THAT REQUIRED HOSPITALIZATION:  #  #  YES  #  #  Has patient had a PCN reaction occurring within the last 10 years: No   . Naproxen     History of GI intolerance with indomethacin.  She is on PPI for GERD.  NSAIDs should not be used long-term or as maintenance medication due to the comorbidities and age as per Beers' List  .  Codeine Nausea And Vomiting  . Indomethacin Diarrhea    Outpatient Encounter Medications as of 09/04/2019  Medication Sig  . acetaminophen (TYLENOL) 650 MG CR tablet Take 650 mg by mouth every 6 (six) hours as needed for pain.  [name] ADVAIR DISKUS 100-50 MCG/DOSE AEPB Inhale 1 puff into the lungs 2 (two) times daily.  [name] albuterol (PROVENTIL HFA;VENTOLIN HFA) 108 (90 Base) MCG/ACT inhaler Inhale 2 puffs into the lungs every 6 (six) hours as needed for wheezing or shortness of breath.  . bisacodyl (DULCOLAX) 10 MG suppository  Constipation (2 of 4): If not relieved by MOM, give 10 mg Bisacodyl suppositiory rectally X 1 dose in 24 hours as needed (Do not use constipation standing orders for residents with renal failure/CFR less than 30. Contact MD for orders)  . Carboxymeth-Glycerin-Polysorb (REFRESH OPTIVE ADVANCED OP) Place 1 drop into both eyes 4 (four) times daily.  . cetaphil (CETAPHIL) cream APPLY CREAM TOPICALLY TO BILATERAL LOWER LEGS AT BEDTIME DX: DRY SKIN  . Cholecalciferol (VITAMIN D3) 125 MCG (5000 UT) CAPS Take 1 capsule by mouth daily.   [name] esomeprazole (NEXIUM) 20 MG capsule Take 1 capsule (20 mg total) by mouth every morning. For acid reflex  . furosemide (LASIX) 40 MG tablet TAKE ONE TABLET BY MOUTH EVERY DAY  . levETIRAcetam (KEPPRA) 500 MG tablet Take 1 tablet (500 mg total) by mouth 2 (two) times daily.  [name] losartan (COZAAR) 50 MG tablet Take 75 mg by mouth daily.   . magnesium hydroxide (MILK OF MAGNESIA) 400 MG/5ML suspension Constipation (1 of 4): If no BM in 3 days, give 30 cc Milk of Magnesium p.o. x 1 dose in 24 hours as needed (Do not use standing constipation orders for residents with renal failure CFR less than 30. Contact MD for orders)  . Menthol, Topical Analgesic, (BIOFREEZE) 4 % GEL Apply 1 application topically 4 (four) times daily. Apply to bilateral knees for osteoarthritis  . metFORMIN (GLUCOPHAGE) 500 MG tablet Take 500 mg by mouth 2 (two) times daily with a meal.  . metoprolol succinate (TOPROL-XL) 25 MG 24 hr tablet Take 25 mg by mouth 2 (two) times daily.   . mometasone (NASONEX) 50 MCG/ACT nasal spray Place 1 spray into the nose as needed.  . protective barrier (RESTORE) CREA Apply topically. Apply barrier cream to buttocks topically BID PRN   No facility-administered encounter medications on file as of 09/04/2019.    Review of Systems  GENERAL: No change in appetite, no fatigue, no weight changes, no fever, chills or weakness MOUTH and THROAT: Denies oral discomfort, gingival  pain or bleeding RESPIRATORY: no cough, SOB, DOE, wheezing, hemoptysis CARDIAC: No chest pain or palpitations GI: No abdominal pain, diarrhea, constipation, heart burn, nausea or vomiting GU: Denies dysuria, frequency, hematuria, incontinence, or discharge NEUROLOGICAL: Denies dizziness, syncope, numbness, or headache PSYCHIATRIC: Denies feelings of depression or anxiety. No report of hallucinations, insomnia, paranoia, or agitation    Immunization History  Administered Date(s) Administered  . Influenza Split 03/08/2013  . Influenza Whole 09/03/2009, 06/03/2010  . Influenza,inj,Quad PF,6+ Mos 06/25/2014  . Influenza-Unspecified 06/08/2017, 03/25/2018, 05/06/2019  . Moderna SARS-COVID-2 Vaccination 08/12/2019  . Pneumococcal Polysaccharide-23 07/25/2002, 10/31/2017  . Pneumococcal-Unspecified 05/07/2019  . Td 07/25/2005  . Tdap 05/03/2019   Pertinent  Health Maintenance Due  Topic Date Due  . COLONOSCOPY  09/02/2002  . MAMMOGRAM  01/14/2017  . DEXA SCAN  09/02/2017  . HEMOGLOBIN A1C  05/09/2019  . OPHTHALMOLOGY EXAM  09/08/2019  . FOOT EXAM  10/09/2019  . PNA vac Low Risk Adult (2 of 2 - PCV13) 05/06/2020  . INFLUENZA VACCINE  Completed   Fall Risk  02/21/2019 03/06/2018 10/20/2017 09/01/2017 08/15/2017  Falls in the past year? (No Data) Yes Yes Yes Yes  Comment Emmi Telephone Survey: data to providers prior to load - - - -  Number falls in past yr: (No Data) 1 2 or more 2 or more 1  Comment Emmi Telephone Survey Actual Response =  - - 7 times  -  Injury with Fall? - Yes Yes No No  Comment - - Broken jaw - -  Risk Factor Category  - High Fall Risk High Fall Risk High Fall Risk High Fall Risk  Risk for fall due to : - Impaired balance/gait History of fall(s);Impaired mobility;Impaired balance/gait Impaired balance/gait;Impaired mobility Impaired balance/gait;Impaired mobility  Follow up - Falls prevention discussed - Falls prevention discussed Education provided     Vitals:    09/04/19 1528  BP: (!) 154/73  Pulse: 86  Resp: 18  Temp: 98.9 F (37.2 C)  TempSrc: Oral  SpO2: 99%  Weight: 199 lb 6.4 oz (90.4 kg)  Height: 5\' 1"  (1.549 m)   Body mass index is 37.68 kg/m.  Physical Exam  GENERAL APPEARANCE: Well nourished. In no acute distress. Morbidly obese SKIN:  Skin is warm and dry.  MOUTH and THROAT: Lips are without lesions. Oral mucosa is moist and without lesions. Tongue is normal in shape, size, and color and without lesions RESPIRATORY: Breathing is even & unlabored, BS CTAB CARDIAC: RRR, no murmur,no extra heart sounds, no edema GI: Abdomen soft, normal BS, no masses, no tenderness NEUROLOGICAL: There is no tremor. Speech is clear. Alert and oriented X 3. PSYCHIATRIC:  Affect and behavior are appropriate  Labs reviewed: Recent Labs    09/06/18 0000  NA 138  K 4.3  BUN 16  CREATININE 1.2*    Recent Labs    09/06/18 0000 09/28/18 0000  WBC 14.5 13.6  NEUTROABS 11 10  HGB 10.7* 10.7*  HCT 32* 32*  PLT 422* 412*   Lab Results  Component Value Date   TSH 1.89 10/02/2018   Lab Results  Component Value Date   HGBA1C 5.8 02/06/2019   Lab Results  Component Value Date   CHOL 178 08/30/2012   HDL 49 08/30/2012   LDLCALC 92 08/30/2012   TRIG 187 (H) 08/30/2012   CHOLHDL 3.6 08/30/2012     Assessment/Plan  1. Uncontrolled hypertension - BPs elevated, will increase losartan from 75 mg to 100 mg daily, continue metoprolol tartrate, monitor BPs  2. Seizure disorder (HCC) -No reported seizures, continue Keppra  3. Type 2 diabetes mellitus treated without insulin (HCC) -Controlled, continue Metformin  4. Lower extremity edema -Due to chronic venous insufficiency, Lasix 40 mg daily    Family/ staff Communication:  Discussed plan of care with resident.  Labs/tests ordered:  CBC, CMP, A1c  Goals of care:   Long-term care   Durenda Age, DNP, FNP-BC [facility] [phone]  (Monday-Friday 8:00 a.m. - 5:00 p.m.) [phone] (after hours)

## 2019-09-06 DIAGNOSIS — R600 Localized edema: Secondary | ICD-10-CM | POA: Insufficient documentation

## 2019-09-09 DIAGNOSIS — Z23 Encounter for immunization: Secondary | ICD-10-CM | POA: Diagnosis not present

## 2019-09-10 DIAGNOSIS — D6489 Other specified anemias: Secondary | ICD-10-CM | POA: Diagnosis not present

## 2019-09-10 DIAGNOSIS — E119 Type 2 diabetes mellitus without complications: Secondary | ICD-10-CM | POA: Diagnosis not present

## 2019-09-10 DIAGNOSIS — D649 Anemia, unspecified: Secondary | ICD-10-CM | POA: Diagnosis not present

## 2019-09-10 LAB — COMPREHENSIVE METABOLIC PANEL
Albumin: 3.6 (ref 3.5–5.0)
Calcium: 9.3 (ref 8.7–10.7)
GFR calc Af Amer: 42.16
GFR calc non Af Amer: 36.37
Globulin: 4.7

## 2019-09-10 LAB — BASIC METABOLIC PANEL
BUN: 13 (ref 4–21)
CO2: 25 — AB (ref 13–22)
Chloride: 103 (ref 99–108)
Creatinine: 1.5 — AB (ref 0.5–1.1)
Glucose: 147
Potassium: 4 (ref 3.4–5.3)
Sodium: 139 (ref 137–147)

## 2019-09-10 LAB — CBC AND DIFFERENTIAL
HCT: 33 — AB (ref 36–46)
Hemoglobin: 10.9 — AB (ref 12.0–16.0)
Neutrophils Absolute: 8
Platelets: 428 — AB (ref 150–399)
WBC: 10.9

## 2019-09-10 LAB — HEPATIC FUNCTION PANEL
ALT: 10 (ref 7–35)
AST: 13 (ref 13–35)
Alkaline Phosphatase: 104 (ref 25–125)
Bilirubin, Total: 0.4

## 2019-09-10 LAB — CBC: RBC: 3.65 — AB (ref 3.87–5.11)

## 2019-09-10 LAB — HEMOGLOBIN A1C: Hemoglobin A1C: 6.1

## 2019-09-18 ENCOUNTER — Non-Acute Institutional Stay (SKILLED_NURSING_FACILITY): Payer: Medicare Other | Admitting: Adult Health

## 2019-09-18 ENCOUNTER — Encounter: Payer: Self-pay | Admitting: Adult Health

## 2019-09-18 DIAGNOSIS — M170 Bilateral primary osteoarthritis of knee: Secondary | ICD-10-CM

## 2019-09-18 DIAGNOSIS — I10 Essential (primary) hypertension: Secondary | ICD-10-CM

## 2019-09-18 DIAGNOSIS — J45909 Unspecified asthma, uncomplicated: Secondary | ICD-10-CM

## 2019-09-18 DIAGNOSIS — E119 Type 2 diabetes mellitus without complications: Secondary | ICD-10-CM | POA: Diagnosis not present

## 2019-09-18 DIAGNOSIS — G40909 Epilepsy, unspecified, not intractable, without status epilepticus: Secondary | ICD-10-CM | POA: Diagnosis not present

## 2019-09-18 DIAGNOSIS — Z7189 Other specified counseling: Secondary | ICD-10-CM | POA: Diagnosis not present

## 2019-09-18 NOTE — Progress Notes (Signed)
Location:  [place] Room Number: 203/A Place of Service:  SNF (31) Provider:  Durenda Age, DNP, FNP-BC  Patient Care Team: Hendricks Limes, MD as PCP - General (Internal Medicine) Medina-Vargas, Senaida Lange, NP as Nurse Practitioner (Internal Medicine)  Extended Emergency Contact Information Primary Emergency Contact: Nuha, Valdivieso Home Phone: [phone] Relation: Sister Secondary Emergency Contact: Sandria Manly Mobile Phone: [phone] Relation: Niece  Code Status:  Full Code  Goals of care: Advanced Directive information Advanced Directives 09/18/2019  Does Patient Have a Medical Advance Directive? Yes  Type of Advance Directive (No Data)  Does patient want to make changes to medical advance directive? No - Patient declined  Would patient like information on creating a medical advance directive? -  Pre-existing out of facility DNR order (yellow form or pink MOST form) -     Chief Complaint  Patient presents with  . Advanced Directive    Advanced Care Plan Meeting    HPI:  Pt is a 69 y.o. female who had an advance care planning today attended by resident, NP, social worker, Scientist, physiological, dietician, MDS coordinator and niece, Ardelle Park, with her husband who attended via telephone conference. Code status remain to be full code. Discussed medications, vital signs and weights. Latest weight is 196.6 lbs, which is stable. She ambulates using the wheelchair as her walker in going to the bathroom and then uses the wheelchair. She currently participates in restorative programs such as walking, dressing/grooming and AROM BUE. No inappropriate behavior reported.  Knees wanted resident to stay at the facility until it is safe for her to go out in the community since there is a pandemic going on. The meeting lasted for 25 minutes.   Past Medical History:  Diagnosis Date  . Allergic rhinitis   . Arthritis    "ankles, feet, knees" (10/30/2017)   . Asthma   . Bursitis   . Carpal tunnel syndrome   . GERD (gastroesophageal reflux disease)   . HTN (hypertension)   . Migraine    "a couple/yr" (10/30/2017)  . Obesity   . Peripheral vascular disease ([place])   . Pneumonia 1960s X 1  . Sciatica   . Tendinitis   . Type II diabetes mellitus (HCC)    Type II  . UTI (urinary tract infection)    Past Surgical History:  Procedure Laterality Date  . CRANIOTOMY N/A 01/19/2018   Procedure: ENDOSCOPIC TRANSPHENOIDAL RESECTION OF PITUITARY TUMOR;  Surgeon: Consuella Lose, MD;  Location: [facility];  Service: Neurosurgery;  Laterality: N/A;  ENDOSCOPIC TRANSPHENOIDAL RESECTION OF PITUITARY TUMOR  . GANGLION CYST EXCISION Left   . KNEE ARTHROSCOPY Left 2003  . PLANTAR FASCIA RELEASE Right 1995   "heel"  . SINUS ENDO WITH FUSION N/A 01/19/2018   Procedure: SINUS ENDO WITH FUSION;  Surgeon: Jerrell Belfast, MD;  Location: [facility];  Service: ENT;  Laterality: N/A;  . TONSILLECTOMY  1958  . TRANSPHENOIDAL APPROACH EXPOSURE N/A 01/19/2018   Procedure: TRANSPHENOIDAL  RESECTION OF PITUITARY TUMOR;  Surgeon: Jerrell Belfast, MD;  Location: [facility];  Service: ENT;  Laterality: N/A;  . [place]; 1997   Dr [name]    Allergies  Allergen Reactions  . Penicillins Hives and Other (See Comments)    ++ got keflex in 2013 and 2019++ Has patient had a PCN reaction causing immediate rash, facial/tongue/throat swelling, SOB or lightheadedness with hypotension: No Has patient had a PCN reaction causing severe rash involving mucus membranes or skin necrosis:  No PATIENT HAS HAD A PCN REACTION THAT REQUIRED HOSPITALIZATION:  #  #  YES  #  #  Has patient had a PCN reaction occurring within the last 10 years: No   . Naproxen     History of GI intolerance with indomethacin.  She is on PPI for GERD.  NSAIDs should not be used long-term or as maintenance medication due to the comorbidities and age as per Beers' List  . Codeine Nausea And Vomiting  .  Indomethacin Diarrhea    Outpatient Encounter Medications as of 09/18/2019  Medication Sig  . acetaminophen (TYLENOL) 650 MG CR tablet Take 650 mg by mouth every 6 (six) hours as needed for pain.  [name] ADVAIR DISKUS 100-50 MCG/DOSE AEPB Inhale 1 puff into the lungs 2 (two) times daily.  [name] albuterol (PROVENTIL HFA;VENTOLIN HFA) 108 (90 Base) MCG/ACT inhaler Inhale 2 puffs into the lungs every 6 (six) hours as needed for wheezing or shortness of breath.  . bisacodyl (DULCOLAX) 10 MG suppository Constipation (2 of 4): If not relieved by MOM, give 10 mg Bisacodyl suppositiory rectally X 1 dose in 24 hours as needed (Do not use constipation standing orders for residents with renal failure/CFR less than 30. Contact MD for orders)  . cetaphil (CETAPHIL) cream APPLY CREAM TOPICALLY TO BILATERAL LOWER LEGS AT BEDTIME DX: DRY SKIN  . Cholecalciferol (VITAMIN D3) 125 MCG (5000 UT) CAPS Take 1 capsule by mouth daily.   [name] esomeprazole (NEXIUM) 20 MG capsule Take 1 capsule (20 mg total) by mouth every morning. For acid reflex  . furosemide (LASIX) 40 MG tablet TAKE ONE TABLET BY MOUTH EVERY DAY  . levETIRAcetam (KEPPRA) 500 MG tablet Take 1 tablet (500 mg total) by mouth 2 (two) times daily.  [name] losartan (COZAAR) 100 MG tablet Take 100 mg by mouth daily.  . magnesium hydroxide (MILK OF MAGNESIA) 400 MG/5ML suspension Constipation (1 of 4): If no BM in 3 days, give 30 cc Milk of Magnesium p.o. x 1 dose in 24 hours as needed (Do not use standing constipation orders for residents with renal failure CFR less than 30. Contact MD for orders)  . Menthol, Topical Analgesic, (BIOFREEZE) 4 % GEL Apply 1 application topically 4 (four) times daily. Apply to bilateral knees for osteoarthritis  . metFORMIN (GLUCOPHAGE) 500 MG tablet Take 500 mg by mouth 2 (two) times daily with a meal.  . metoprolol succinate (TOPROL-XL) 25 MG 24 hr tablet Take 50 mg by mouth 2 (two) times daily.   . mometasone (NASONEX) 50 MCG/ACT nasal spray  Place 1 spray into the nose as needed.  . protective barrier (RESTORE) CREA Apply topically. Apply barrier cream to buttocks topically BID PRN  . [DISCONTINUED] losartan (COZAAR) 50 MG tablet Take 75 mg by mouth daily.    No facility-administered encounter medications on file as of 09/18/2019.    Review of Systems  GENERAL: No change in appetite, no fatigue, no weight changes, no fever, chills or weakness MOUTH and THROAT: Denies oral discomfort, gingival pain or bleeding RESPIRATORY: no cough, SOB, DOE, wheezing, hemoptysis CARDIAC: No chest pain or palpitations GI: No abdominal pain, diarrhea, constipation, heart burn, nausea or vomiting GU: Denies dysuria, frequency, hematuria, incontinence, or discharge NEUROLOGICAL: Denies dizziness, syncope, numbness, or headache PSYCHIATRIC: Denies feelings of depression or anxiety. No report of hallucinations, insomnia, paranoia, or agitation   Immunization History  Administered Date(s) Administered  . Influenza Split 03/08/2013  . Influenza Whole 09/03/2009, 06/03/2010  . Influenza,inj,Quad PF,6+ Mos 06/25/2014  .  Influenza-Unspecified 06/08/2017, 03/25/2018, 05/06/2019  . Moderna SARS-COVID-2 Vaccination 08/12/2019, 09/09/2019  . Pneumococcal Polysaccharide-23 07/25/2002, 10/31/2017  . Pneumococcal-Unspecified 05/07/2019  . Td 07/25/2005  . Tdap 05/03/2019   Pertinent  Health Maintenance Due  Topic Date Due  . COLONOSCOPY  09/02/2002  . MAMMOGRAM  01/14/2017  . DEXA SCAN  09/02/2017  . OPHTHALMOLOGY EXAM  09/08/2019  . FOOT EXAM  10/09/2019  . HEMOGLOBIN A1C  12/08/2019  . PNA vac Low Risk Adult (2 of 2 - PCV13) 05/06/2020  . INFLUENZA VACCINE  Completed   Fall Risk  02/21/2019 03/06/2018 10/20/2017 09/01/2017 08/15/2017  Falls in the past year? (No Data) Yes Yes Yes Yes  Comment Emmi Telephone Survey: data to providers prior to load - - - -  Number falls in past yr: (No Data) 1 2 or more 2 or more 1  Comment Emmi Telephone Survey  Actual Response =  - - 7 times  -  Injury with Fall? - Yes Yes No No  Comment - - Broken jaw - -  Risk Factor Category  - High Fall Risk High Fall Risk High Fall Risk High Fall Risk  Risk for fall due to : - Impaired balance/gait History of fall(s);Impaired mobility;Impaired balance/gait Impaired balance/gait;Impaired mobility Impaired balance/gait;Impaired mobility  Follow up - Falls prevention discussed - Falls prevention discussed Education provided     Vitals:   09/18/19 1315  BP: (!) 173/88  Pulse: 76  Resp: 19  Temp: 98.9 F (37.2 C)  TempSrc: Oral  SpO2: 99%  Weight: 195 lb 6.4 oz (88.6 kg)  Height: 5\' 1"  (1.549 m)   Body mass index is 36.92 kg/m.  Physical Exam  GENERAL APPEARANCE: Well nourished. In no acute distress.  Morbidly obese  SKIN:  Skin is warm and dry.  MOUTH and THROAT: Lips are without lesions. Oral mucosa is moist and without lesions. Tongue is normal in shape, size, and color and without lesions RESPIRATORY: Breathing is even & unlabored, BS CTAB CARDIAC: RRR, no murmur,no extra heart sounds GI: Abdomen soft, normal BS, no masses, no tenderness EXTREMITIES:  Able to move X 4 extremities NEUROLOGICAL: There is no tremor. Speech is clear. Alert and oriented X 3. PSYCHIATRIC:  Affect and behavior are appropriate  Labs reviewed: Recent Labs    09/10/19 0000  NA 139  K 4.0  CL 103  CO2 25*  BUN 13  CREATININE 1.5*  CALCIUM 9.3   Recent Labs    09/10/19 0000  AST 13  ALT 10  ALKPHOS 104  ALBUMIN 3.6   Recent Labs    09/28/18 0000 09/10/19 0000  WBC 13.6 10.9  NEUTROABS 10 8  HGB 10.7* 10.9*  HCT 32* 33*  PLT 412* 428*   Lab Results  Component Value Date   TSH 1.89 10/02/2018   Lab Results  Component Value Date   HGBA1C 6.1 09/10/2019   Lab Results  Component Value Date   CHOL 178 08/30/2012   HDL 49 08/30/2012   LDLCALC 92 08/30/2012   TRIG 187 (H) 08/30/2012   CHOLHDL 3.6 08/30/2012      Assessment/Plan  1.  Advance care planning -Remains to have full code status -Medications, vital signs and weights  2. Type 2 diabetes mellitus treated without insulin (HCC) Lab Results  Component Value Date   HGBA1C 6.1 09/10/2019   -Continue Metformin and check CBGs  3. Moderate asthma without complication, unspecified whether persistent -No wheezing, continue Advair and PRN albuterol HFA inhaler  4. Seizure  disorder ([place]) -No recent seizures, continue Keppra  5. Essential hypertension - Stable, recently increased losartan to 100 mg daily, continue metoprolol titrate 50 mg twice a day  6. Primary osteoarthritis of both knees -Uses wheelchair to walk to the bathroom, continue Biofreeze 4% gel topically to bilateral knees TID and MAPAP arthritis ER 650 mg 1 tab every 6 hours PRN    Family/ staff Communication: Discussed plan of care with resident, niece with her husband and IDT.  Labs/tests ordered: None  Goals of care:   Long-term care   Durenda Age, DNP, FNP-BC [facility] [phone] (Monday-Friday 8:00 a.m. - 5:00 p.m.) [phone] (after hours)

## 2019-10-02 ENCOUNTER — Ambulatory Visit: Payer: Medicare Other | Admitting: Internal Medicine

## 2019-10-08 ENCOUNTER — Non-Acute Institutional Stay (SKILLED_NURSING_FACILITY): Payer: Medicare Other | Admitting: Internal Medicine

## 2019-10-08 ENCOUNTER — Encounter: Payer: Self-pay | Admitting: Internal Medicine

## 2019-10-08 DIAGNOSIS — R4189 Other symptoms and signs involving cognitive functions and awareness: Secondary | ICD-10-CM | POA: Diagnosis not present

## 2019-10-08 DIAGNOSIS — N950 Postmenopausal bleeding: Secondary | ICD-10-CM

## 2019-10-08 DIAGNOSIS — I10 Essential (primary) hypertension: Secondary | ICD-10-CM | POA: Diagnosis not present

## 2019-10-08 DIAGNOSIS — D352 Benign neoplasm of pituitary gland: Secondary | ICD-10-CM | POA: Diagnosis not present

## 2019-10-08 DIAGNOSIS — R29818 Other symptoms and signs involving the nervous system: Secondary | ICD-10-CM

## 2019-10-08 DIAGNOSIS — E1159 Type 2 diabetes mellitus with other circulatory complications: Secondary | ICD-10-CM

## 2019-10-08 DIAGNOSIS — I739 Peripheral vascular disease, unspecified: Secondary | ICD-10-CM

## 2019-10-08 DIAGNOSIS — D649 Anemia, unspecified: Secondary | ICD-10-CM | POA: Diagnosis not present

## 2019-10-08 NOTE — Assessment & Plan Note (Signed)
Apparently endocrinologic follow-up was scheduled for 3/10 but inadvertently not kept. Dr Arman Filter office will be sent a copy of this note to facilitate continuity of care.

## 2019-10-08 NOTE — Assessment & Plan Note (Signed)
Her peripheral edema is clinically stable.

## 2019-10-08 NOTE — Progress Notes (Signed)
NURSING HOME LOCATION:  [facility] ROOM NUMBER:  203/A  CODE STATUS: Full Code   PCP: Hendricks Limes MD.   This is a nursing facility follow up of chronic medical diagnoses  Interim medical record and care since last [facility] visit was updated with review of diagnostic studies and change in clinical status since last visit were documented.  HPI: She is a permanent resident of the facility with medical diagnoses of type 2 diabetes, peripheral vascular disease, essential hypertension, GERD, NASH, asthma and history of surgery for pituitary macroadenoma.  Labs performed last month revealed stage IIIa CKD as well as a mild anemia.  Hemoglobin 10.9/hematocrit 33.  A1c was prediabetic at 6.1%.  Review of systems: Dementia invalidated responses. Date given as 10/06/19, but she confabulates.  She focused on clothing which she believes has "disappeared and then reappeared".  She mentions "on the way over here I had 5 gowns and 3 pairs of shoes".  She talks about the skin over her shins changing color.  She states that her breathing overall has been good but she does have intermittent wheezing.  She emphasizes that she is "ready to come home".  Her main complaint was "right knee showing out", apparently meaning pain in the knee.  She relates this to change in the weather, especially rain. Her anemia is stable; she denies any bleeding dyscrasias except for vaginal bleeding.  She related vaginal bleeding occurring for 2-3 days every 4 months.  This has not been validated by the staff When the nurse practitioner student came into the room the patient acted as if she had known her through her "subbing at [facility] [employer] and [employer]".  The NP student had attended neither.  Identifying the NP student would have been difficult as she was wearing an N95 mask, face shield and head cover.  Constitutional: No fever, significant weight change, fatigue  Eyes: No redness, discharge,  pain, vision change ENT/mouth: No nasal congestion,  purulent discharge, earache, change in hearing, sore throat  Cardiovascular: No chest pain, palpitations, paroxysmal nocturnal dyspnea, claudication, edema  Respiratory: No cough, sputum production, hemoptysis, DOE, significant snoring, apnea   Gastrointestinal: No heartburn, dysphagia, abdominal pain, nausea /vomiting, rectal bleeding, melena, change in bowels Genitourinary: No dysuria, hematuria, pyuria, incontinence, nocturia Dermatologic: No rash, pruritus, change in appearance of skin Neurologic: No dizziness, headache, syncope, seizures, numbness, tingling Psychiatric: No significant anxiety, depression, insomnia, anorexia Endocrine: No change in hair/skin/nails, excessive thirst, excessive hunger, excessive urination  Hematologic/lymphatic: No significant bruising, lymphadenopathy, abnormal bleeding Allergy/immunology: No itchy/watery eyes, significant sneezing, urticaria, angioedema  Physical exam:  Pertinent or positive findings: She was noted to walk slowly with a wide based gait with apparent discomfort in the right knee.  There was no definite limp.   Bilateral ptosis is present.  Pupils are small.  She has a grade 0000000 systolic murmur at the base.  She has minimal expiratory rhonchi posteriorly.  Abdomen is protuberant.  Pedal pulses are decreased.  There is nonpitting edema of the lower extremities with thickened hyper pigmented skin.  Fusiform changes are noted of the knees.  I cannot appreciate definite effusion on the right. Significantly on the bedside table are multiple sets of plastic utensils and many daily menus suggesting hoarding behavior.  General appearance: Adequately nourished; no acute distress, increased work of breathing is present.   Lymphatic: No lymphadenopathy about the head, neck, axilla. Eyes: No conjunctival inflammation or lid edema is present. There is no scleral icterus.  Ears:  External ear exam shows  no significant lesions or deformities.   Nose:  External nasal examination shows no deformity or inflammation. Nasal mucosa are pink and moist without lesions, exudates Oral exam:  Lips and gums are healthy appearing. There is no oropharyngeal erythema or exudate. Neck:  No thyromegaly, masses, tenderness noted.    Heart:  Normal rate and regular rhythm. S1 and S2 normal without gallop,  click, rub .  Lungs:  without wheezes,  rales, rubs. Abdomen: Bowel sounds are normal. Abdomen is soft and nontender with no organomegaly, hernias, masses. GU: Deferred  Extremities:  No cyanosis, clubbing  Neurologic exam : Balance, Rhomberg, finger to nose testing could not be completed due to clinical state Skin: Warm & dry w/o tenting. No significant  rash.  See summary under each active problem in the Problem List with associated updated therapeutic plan

## 2019-10-08 NOTE — Assessment & Plan Note (Signed)
She exhibits confabulation and hoarding behaviors.  Psych NP follow-up will be requested.

## 2019-10-08 NOTE — Patient Instructions (Signed)
See assessment and plan under each diagnosis in the problem list and acutely for this visit 

## 2019-10-08 NOTE — Assessment & Plan Note (Addendum)
Anemia is stable @ 10.9/33 despite history of cyclical vaginal bleeding

## 2019-10-08 NOTE — Assessment & Plan Note (Signed)
BP controlled; no change in antihypertensive medications  

## 2019-10-08 NOTE — Assessment & Plan Note (Signed)
Today she apparently confabulated about vaginal bleeding every 4 months lasting 2-3 days.  None has been documented by staff.

## 2019-10-17 DIAGNOSIS — M6281 Muscle weakness (generalized): Secondary | ICD-10-CM | POA: Diagnosis not present

## 2019-10-17 DIAGNOSIS — I872 Venous insufficiency (chronic) (peripheral): Secondary | ICD-10-CM | POA: Diagnosis not present

## 2019-10-17 DIAGNOSIS — G9340 Encephalopathy, unspecified: Secondary | ICD-10-CM | POA: Diagnosis not present

## 2019-10-18 DIAGNOSIS — I872 Venous insufficiency (chronic) (peripheral): Secondary | ICD-10-CM | POA: Diagnosis not present

## 2019-10-18 DIAGNOSIS — M6281 Muscle weakness (generalized): Secondary | ICD-10-CM | POA: Diagnosis not present

## 2019-10-18 DIAGNOSIS — G9340 Encephalopathy, unspecified: Secondary | ICD-10-CM | POA: Diagnosis not present

## 2019-10-21 DIAGNOSIS — G9340 Encephalopathy, unspecified: Secondary | ICD-10-CM | POA: Diagnosis not present

## 2019-10-21 DIAGNOSIS — M6281 Muscle weakness (generalized): Secondary | ICD-10-CM | POA: Diagnosis not present

## 2019-10-21 DIAGNOSIS — I872 Venous insufficiency (chronic) (peripheral): Secondary | ICD-10-CM | POA: Diagnosis not present

## 2019-10-22 DIAGNOSIS — M6281 Muscle weakness (generalized): Secondary | ICD-10-CM | POA: Diagnosis not present

## 2019-10-22 DIAGNOSIS — I872 Venous insufficiency (chronic) (peripheral): Secondary | ICD-10-CM | POA: Diagnosis not present

## 2019-10-22 DIAGNOSIS — G9340 Encephalopathy, unspecified: Secondary | ICD-10-CM | POA: Diagnosis not present

## 2019-10-23 DIAGNOSIS — M6281 Muscle weakness (generalized): Secondary | ICD-10-CM | POA: Diagnosis not present

## 2019-10-23 DIAGNOSIS — I872 Venous insufficiency (chronic) (peripheral): Secondary | ICD-10-CM | POA: Diagnosis not present

## 2019-10-23 DIAGNOSIS — G9340 Encephalopathy, unspecified: Secondary | ICD-10-CM | POA: Diagnosis not present

## 2019-10-24 DIAGNOSIS — M6281 Muscle weakness (generalized): Secondary | ICD-10-CM | POA: Diagnosis not present

## 2019-10-24 DIAGNOSIS — I872 Venous insufficiency (chronic) (peripheral): Secondary | ICD-10-CM | POA: Diagnosis not present

## 2019-10-25 DIAGNOSIS — M6281 Muscle weakness (generalized): Secondary | ICD-10-CM | POA: Diagnosis not present

## 2019-10-25 DIAGNOSIS — I872 Venous insufficiency (chronic) (peripheral): Secondary | ICD-10-CM | POA: Diagnosis not present

## 2019-10-28 DIAGNOSIS — M6281 Muscle weakness (generalized): Secondary | ICD-10-CM | POA: Diagnosis not present

## 2019-10-28 DIAGNOSIS — I872 Venous insufficiency (chronic) (peripheral): Secondary | ICD-10-CM | POA: Diagnosis not present

## 2019-10-29 DIAGNOSIS — I872 Venous insufficiency (chronic) (peripheral): Secondary | ICD-10-CM | POA: Diagnosis not present

## 2019-10-29 DIAGNOSIS — M6281 Muscle weakness (generalized): Secondary | ICD-10-CM | POA: Diagnosis not present

## 2019-10-30 DIAGNOSIS — M6281 Muscle weakness (generalized): Secondary | ICD-10-CM | POA: Diagnosis not present

## 2019-10-30 DIAGNOSIS — I872 Venous insufficiency (chronic) (peripheral): Secondary | ICD-10-CM | POA: Diagnosis not present

## 2019-10-31 DIAGNOSIS — I872 Venous insufficiency (chronic) (peripheral): Secondary | ICD-10-CM | POA: Diagnosis not present

## 2019-10-31 DIAGNOSIS — M6281 Muscle weakness (generalized): Secondary | ICD-10-CM | POA: Diagnosis not present

## 2019-11-01 DIAGNOSIS — I872 Venous insufficiency (chronic) (peripheral): Secondary | ICD-10-CM | POA: Diagnosis not present

## 2019-11-01 DIAGNOSIS — M6281 Muscle weakness (generalized): Secondary | ICD-10-CM | POA: Diagnosis not present

## 2019-11-04 DIAGNOSIS — M6281 Muscle weakness (generalized): Secondary | ICD-10-CM | POA: Diagnosis not present

## 2019-11-04 DIAGNOSIS — I872 Venous insufficiency (chronic) (peripheral): Secondary | ICD-10-CM | POA: Diagnosis not present

## 2019-11-05 ENCOUNTER — Non-Acute Institutional Stay (SKILLED_NURSING_FACILITY): Payer: Medicare Other | Admitting: Adult Health

## 2019-11-05 ENCOUNTER — Encounter: Payer: Self-pay | Admitting: Adult Health

## 2019-11-05 DIAGNOSIS — G40909 Epilepsy, unspecified, not intractable, without status epilepticus: Secondary | ICD-10-CM

## 2019-11-05 DIAGNOSIS — J45909 Unspecified asthma, uncomplicated: Secondary | ICD-10-CM | POA: Diagnosis not present

## 2019-11-05 DIAGNOSIS — E1159 Type 2 diabetes mellitus with other circulatory complications: Secondary | ICD-10-CM

## 2019-11-05 DIAGNOSIS — M6281 Muscle weakness (generalized): Secondary | ICD-10-CM | POA: Diagnosis not present

## 2019-11-05 DIAGNOSIS — R4189 Other symptoms and signs involving cognitive functions and awareness: Secondary | ICD-10-CM | POA: Diagnosis not present

## 2019-11-05 DIAGNOSIS — I872 Venous insufficiency (chronic) (peripheral): Secondary | ICD-10-CM | POA: Diagnosis not present

## 2019-11-05 DIAGNOSIS — E1151 Type 2 diabetes mellitus with diabetic peripheral angiopathy without gangrene: Secondary | ICD-10-CM | POA: Diagnosis not present

## 2019-11-05 DIAGNOSIS — I10 Essential (primary) hypertension: Secondary | ICD-10-CM

## 2019-11-05 DIAGNOSIS — R29818 Other symptoms and signs involving the nervous system: Secondary | ICD-10-CM | POA: Diagnosis not present

## 2019-11-05 DIAGNOSIS — I152 Hypertension secondary to endocrine disorders: Secondary | ICD-10-CM

## 2019-11-05 NOTE — Progress Notes (Signed)
Location:  [facility] Room Number: 203-A Place of Service:  SNF (31) Provider:  Durenda Age, DNP, FNP-BC  Patient Care Team: Hendricks Limes, MD as PCP - General (Internal Medicine) Medina-Vargas, Senaida Lange, NP as Nurse Practitioner (Internal Medicine)  Extended Emergency Contact Information Primary Emergency Contact: [place], [place] Home Phone: [phone] Relation: Sister Secondary Emergency Contact: Sandria Manly Mobile Phone: [phone] Relation: Niece  Code Status:  Full Code  Goals of care: Advanced Directive information Advanced Directives 09/18/2019  Does Patient Have a Medical Advance Directive? Yes  Type of Advance Directive (No Data)  Does patient want to make changes to medical advance directive? No - Patient declined  Would patient like information on creating a medical advance directive? -  Pre-existing out of facility DNR order (yellow form or pink MOST form) -     Chief Complaint  Patient presents with  . Medical Management of Chronic Issues    Routine [facility] visit    HPI:  Pt is a 69 y.o. female seen today for medical management of chronic diseases.  She is a long-term care resident of [facility].  She has a PMH of CAD, diabetes mellitus and asthma. She is currently having ST and OT. Last BIMS score 7/10, severe cognitive impairment. She had a fall incident on 10/29/19 wherein she forgot to lock her wheelchair and she slid to the floor. She participates in restorative walking.    Past Medical History:  Diagnosis Date  . Allergic rhinitis   . Arthritis    "ankles, feet, knees" (10/30/2017)  . Asthma   . Bursitis   . Carpal tunnel syndrome   . GERD (gastroesophageal reflux disease)   . HTN (hypertension)   . Migraine    "a couple/yr" (10/30/2017)  . Obesity   . Peripheral vascular disease ([place])   . Pneumonia 1960s X 1  . Sciatica   . Tendinitis   . Type II diabetes mellitus (HCC)      Type II  . UTI (urinary tract infection)    Past Surgical History:  Procedure Laterality Date  . CRANIOTOMY N/A 01/19/2018   Procedure: ENDOSCOPIC TRANSPHENOIDAL RESECTION OF PITUITARY TUMOR;  Surgeon: Consuella Lose, MD;  Location: [facility];  Service: Neurosurgery;  Laterality: N/A;  ENDOSCOPIC TRANSPHENOIDAL RESECTION OF PITUITARY TUMOR  . GANGLION CYST EXCISION Left   . KNEE ARTHROSCOPY Left 2003  . PLANTAR FASCIA RELEASE Right 1995   "heel"  . SINUS ENDO WITH FUSION N/A 01/19/2018   Procedure: SINUS ENDO WITH FUSION;  Surgeon: Jerrell Belfast, MD;  Location: [facility];  Service: ENT;  Laterality: N/A;  . TONSILLECTOMY  1958  . TRANSPHENOIDAL APPROACH EXPOSURE N/A 01/19/2018   Procedure: TRANSPHENOIDAL  RESECTION OF PITUITARY TUMOR;  Surgeon: Jerrell Belfast, MD;  Location: [facility];  Service: ENT;  Laterality: N/A;  . [place]; 1997   Dr [name]    Allergies  Allergen Reactions  . Penicillins Hives and Other (See Comments)    ++ got keflex in 2013 and 2019++ Has patient had a PCN reaction causing immediate rash, facial/tongue/throat swelling, SOB or lightheadedness with hypotension: No Has patient had a PCN reaction causing severe rash involving mucus membranes or skin necrosis: No PATIENT HAS HAD A PCN REACTION THAT REQUIRED HOSPITALIZATION:  #  #  YES  #  #  Has patient had a PCN reaction occurring within the last 10 years: No   . Naproxen     History of  GI intolerance with indomethacin.  She is on PPI for GERD.  NSAIDs should not be used long-term or as maintenance medication due to the comorbidities and age as per [place]' List  . Codeine Nausea And Vomiting  . Indomethacin Diarrhea    Outpatient Encounter Medications as of 11/05/2019  Medication Sig  . acetaminophen (TYLENOL) 650 MG CR tablet Take 650 mg by mouth every 6 (six) hours as needed for pain.  [name] ADVAIR DISKUS 100-50 MCG/DOSE AEPB Inhale 1 puff into the lungs 2 (two) times daily.  [name] albuterol  (PROVENTIL HFA;VENTOLIN HFA) 108 (90 Base) MCG/ACT inhaler Inhale 2 puffs into the lungs every 6 (six) hours as needed for wheezing or shortness of breath.  . bisacodyl (DULCOLAX) 10 MG suppository Constipation (2 of 4): If not relieved by MOM, give 10 mg Bisacodyl suppositiory rectally X 1 dose in 24 hours as needed (Do not use constipation standing orders for residents with renal failure/CFR less than 30. Contact MD for orders)  . cetaphil (CETAPHIL) cream Apply 1 application topically at bedtime. Apply to bilateral lower legs for dry skin.  . Cholecalciferol (VITAMIN D3) 125 MCG (5000 UT) CAPS Take 1 capsule by mouth daily.   [name] esomeprazole (NEXIUM) 20 MG capsule Take 1 capsule (20 mg total) by mouth every morning. For acid reflex  . furosemide (LASIX) 40 MG tablet TAKE ONE TABLET BY MOUTH EVERY DAY  . levETIRAcetam (KEPPRA) 500 MG tablet Take 1 tablet (500 mg total) by mouth 2 (two) times daily.  [name] losartan (COZAAR) 100 MG tablet Take 100 mg by mouth daily.  . magnesium hydroxide (MILK OF MAGNESIA) 400 MG/5ML suspension Constipation (1 of 4): If no BM in 3 days, give 30 cc Milk of Magnesium p.o. x 1 dose in 24 hours as needed (Do not use standing constipation orders for residents with renal failure CFR less than 30. Contact MD for orders)  . Menthol, Topical Analgesic, (BIOFREEZE) 4 % GEL Apply 1 application topically 3 (three) times daily. Apply to bilateral knees for osteoarthritis  . metFORMIN (GLUCOPHAGE) 500 MG tablet Take 500 mg by mouth 2 (two) times daily with a meal.  . metoprolol succinate (TOPROL-XL) 25 MG 24 hr tablet Take 50 mg by mouth 2 (two) times daily.   . mometasone (NASONEX) 50 MCG/ACT nasal spray Place 1 spray into the nose as needed.  . protective barrier (RESTORE) CREA Apply topically. Apply barrier cream to buttocks topically BID PRN   No facility-administered encounter medications on file as of 11/05/2019.    Review of Systems  GENERAL: No change in appetite, no  fatigue, no weight changes, no fever, chills or weakness MOUTH and THROAT: Denies oral discomfort, gingival pain or bleeding RESPIRATORY: no cough, SOB, DOE, wheezing, hemoptysis CARDIAC: No chest pain or palpitations GI: No abdominal pain, diarrhea, constipation, heart burn, nausea or vomiting GU: Denies dysuria, frequency, hematuria, incontinence, or discharge NEUROLOGICAL: Denies dizziness, syncope, numbness, or headache PSYCHIATRIC: Denies feelings of depression or anxiety. No report of hallucinations, insomnia, paranoia, or agitation   Immunization History  Administered Date(s) Administered  . Influenza Split 03/08/2013  . Influenza Whole 09/03/2009, 06/03/2010  . Influenza,inj,Quad PF,6+ Mos 06/25/2014  . Influenza-Unspecified 06/08/2017, 03/25/2018, 05/06/2019  . Moderna SARS-COVID-2 Vaccination 08/12/2019, 09/09/2019  . Pneumococcal Polysaccharide-23 07/25/2002, 10/31/2017  . Pneumococcal-Unspecified 05/07/2019  . Td 07/25/2005  . Tdap 05/03/2019   Pertinent  Health Maintenance Due  Topic Date Due  . COLONOSCOPY  Never done  . MAMMOGRAM  01/14/2017  . DEXA SCAN  Never done  . OPHTHALMOLOGY EXAM  09/08/2019  . FOOT EXAM  10/09/2019  . HEMOGLOBIN A1C  12/08/2019  . INFLUENZA VACCINE  02/23/2020  . PNA vac Low Risk Adult (2 of 2 - PCV13) 05/06/2020   Fall Risk  02/21/2019 03/06/2018 10/20/2017 09/01/2017 08/15/2017  Falls in the past year? (No Data) Yes Yes Yes Yes  Comment Emmi Telephone Survey: data to providers prior to load - - - -  Number falls in past yr: (No Data) 1 2 or more 2 or more 1  Comment Emmi Telephone Survey Actual Response =  - - 7 times  -  Injury with Fall? - Yes Yes No No  Comment - - Broken jaw - -  Risk Factor Category  - High Fall Risk High Fall Risk High Fall Risk High Fall Risk  Risk for fall due to : - Impaired balance/gait History of fall(s);Impaired mobility;Impaired balance/gait Impaired balance/gait;Impaired mobility Impaired  balance/gait;Impaired mobility  Follow up - Falls prevention discussed - Falls prevention discussed Education provided     Vitals:   11/05/19 1004  BP: 133/69  Pulse: 87  Resp: 19  Temp: 97.7 F (36.5 C)  TempSrc: Oral  SpO2: 93%  Weight: 193 lb 9.6 oz (87.8 kg)  Height: 5\' 1"  (1.549 m)   Body mass index is 36.58 kg/m.  Physical Exam  GENERAL APPEARANCE: Well nourished. In no acute distress. Obese SKIN:  Skin is warm and dry.  MOUTH and THROAT: Lips are without lesions. Oral mucosa is moist and without lesions. Tongue is normal in shape, size, and color and without lesions RESPIRATORY: Breathing is even & unlabored, BS CTAB CARDIAC: RRR, no murmur,no extra heart sounds GI: Abdomen soft, normal BS, no masses, no tenderness EXTREMITIES:  Able to move X 4 extremities NEUROLOGICAL: There is no tremor. Speech is clear.  PSYCHIATRIC:  Affect and behavior are appropriate  Labs reviewed: Recent Labs    09/10/19 0000  NA 139  K 4.0  CL 103  CO2 25*  BUN 13  CREATININE 1.5*  CALCIUM 9.3   Recent Labs    09/10/19 0000  AST 13  ALT 10  ALKPHOS 104  ALBUMIN 3.6   Recent Labs    09/10/19 0000  WBC 10.9  NEUTROABS 8  HGB 10.9*  HCT 33*  PLT 428*   Lab Results  Component Value Date   TSH 1.89 10/02/2018   Lab Results  Component Value Date   HGBA1C 6.1 09/10/2019   Lab Results  Component Value Date   CHOL 178 08/30/2012   HDL 49 08/30/2012   LDLCALC 92 08/30/2012   TRIG 187 (H) 08/30/2012   CHOLHDL 3.6 08/30/2012    Assessment/Plan  1. Diabetes mellitus with peripheral vascular disease ([place]) Lab Results  Component Value Date   HGBA1C 6.1 09/10/2019   - stable, continue metformin 500 mg 1 tab twice a day  2. Seizure disorder ([place]) -No recent seizures, continue levetiracetam  -Seizure precautions  3. Hypertension associated with diabetes ([place]) -Improved, continue metoprolol tartrate 50 mg 1 tablet twice a day and losartan 100 mg 1 tab  daily  4. Moderate asthma without complication, unspecified whether persistent -No wheezing nor SOB, continue PRN albuterol  5. Neurocognitive deficits -  BIMS score 7/15, severe cognitive impairment -Currently having speech therapy    Family/ staff Communication: Discussed plan of care with resident and charge nurse.  Labs/tests ordered:  None  Goals of care:   Long-term care/   Rosanne Wohlfarth Medina-Vargas, DNP,  FNP-BC [facility] [phone] (Monday-Friday 8:00 a.m. - 5:00 p.m.) [phone] (after hours)

## 2019-11-06 DIAGNOSIS — I872 Venous insufficiency (chronic) (peripheral): Secondary | ICD-10-CM | POA: Diagnosis not present

## 2019-11-06 DIAGNOSIS — M6281 Muscle weakness (generalized): Secondary | ICD-10-CM | POA: Diagnosis not present

## 2019-11-07 DIAGNOSIS — M6281 Muscle weakness (generalized): Secondary | ICD-10-CM | POA: Diagnosis not present

## 2019-11-07 DIAGNOSIS — I872 Venous insufficiency (chronic) (peripheral): Secondary | ICD-10-CM | POA: Diagnosis not present

## 2019-11-08 DIAGNOSIS — I872 Venous insufficiency (chronic) (peripheral): Secondary | ICD-10-CM | POA: Diagnosis not present

## 2019-11-08 DIAGNOSIS — M6281 Muscle weakness (generalized): Secondary | ICD-10-CM | POA: Diagnosis not present

## 2019-11-11 DIAGNOSIS — I872 Venous insufficiency (chronic) (peripheral): Secondary | ICD-10-CM | POA: Diagnosis not present

## 2019-11-11 DIAGNOSIS — M6281 Muscle weakness (generalized): Secondary | ICD-10-CM | POA: Diagnosis not present

## 2019-11-12 DIAGNOSIS — I872 Venous insufficiency (chronic) (peripheral): Secondary | ICD-10-CM | POA: Diagnosis not present

## 2019-11-12 DIAGNOSIS — M6281 Muscle weakness (generalized): Secondary | ICD-10-CM | POA: Diagnosis not present

## 2019-11-13 DIAGNOSIS — I872 Venous insufficiency (chronic) (peripheral): Secondary | ICD-10-CM | POA: Diagnosis not present

## 2019-11-13 DIAGNOSIS — M6281 Muscle weakness (generalized): Secondary | ICD-10-CM | POA: Diagnosis not present

## 2019-11-14 DIAGNOSIS — I872 Venous insufficiency (chronic) (peripheral): Secondary | ICD-10-CM | POA: Diagnosis not present

## 2019-11-14 DIAGNOSIS — M6281 Muscle weakness (generalized): Secondary | ICD-10-CM | POA: Diagnosis not present

## 2019-11-15 DIAGNOSIS — M6281 Muscle weakness (generalized): Secondary | ICD-10-CM | POA: Diagnosis not present

## 2019-11-15 DIAGNOSIS — I872 Venous insufficiency (chronic) (peripheral): Secondary | ICD-10-CM | POA: Diagnosis not present

## 2019-11-18 DIAGNOSIS — M6281 Muscle weakness (generalized): Secondary | ICD-10-CM | POA: Diagnosis not present

## 2019-11-18 DIAGNOSIS — I872 Venous insufficiency (chronic) (peripheral): Secondary | ICD-10-CM | POA: Diagnosis not present

## 2019-11-19 DIAGNOSIS — M6281 Muscle weakness (generalized): Secondary | ICD-10-CM | POA: Diagnosis not present

## 2019-11-19 DIAGNOSIS — I872 Venous insufficiency (chronic) (peripheral): Secondary | ICD-10-CM | POA: Diagnosis not present

## 2019-11-20 DIAGNOSIS — M6281 Muscle weakness (generalized): Secondary | ICD-10-CM | POA: Diagnosis not present

## 2019-11-20 DIAGNOSIS — I872 Venous insufficiency (chronic) (peripheral): Secondary | ICD-10-CM | POA: Diagnosis not present

## 2019-11-21 DIAGNOSIS — I872 Venous insufficiency (chronic) (peripheral): Secondary | ICD-10-CM | POA: Diagnosis not present

## 2019-11-21 DIAGNOSIS — M6281 Muscle weakness (generalized): Secondary | ICD-10-CM | POA: Diagnosis not present

## 2019-11-23 DIAGNOSIS — M6281 Muscle weakness (generalized): Secondary | ICD-10-CM | POA: Diagnosis not present

## 2019-11-23 DIAGNOSIS — I872 Venous insufficiency (chronic) (peripheral): Secondary | ICD-10-CM | POA: Diagnosis not present

## 2019-11-25 DIAGNOSIS — M6281 Muscle weakness (generalized): Secondary | ICD-10-CM | POA: Diagnosis not present

## 2019-11-25 DIAGNOSIS — I872 Venous insufficiency (chronic) (peripheral): Secondary | ICD-10-CM | POA: Diagnosis not present

## 2019-11-26 DIAGNOSIS — M6281 Muscle weakness (generalized): Secondary | ICD-10-CM | POA: Diagnosis not present

## 2019-11-26 DIAGNOSIS — I872 Venous insufficiency (chronic) (peripheral): Secondary | ICD-10-CM | POA: Diagnosis not present

## 2019-11-27 DIAGNOSIS — I872 Venous insufficiency (chronic) (peripheral): Secondary | ICD-10-CM | POA: Diagnosis not present

## 2019-11-27 DIAGNOSIS — M6281 Muscle weakness (generalized): Secondary | ICD-10-CM | POA: Diagnosis not present

## 2019-11-28 DIAGNOSIS — M6281 Muscle weakness (generalized): Secondary | ICD-10-CM | POA: Diagnosis not present

## 2019-11-28 DIAGNOSIS — I872 Venous insufficiency (chronic) (peripheral): Secondary | ICD-10-CM | POA: Diagnosis not present

## 2019-11-29 ENCOUNTER — Non-Acute Institutional Stay (SKILLED_NURSING_FACILITY): Payer: Medicare Other | Admitting: Adult Health

## 2019-11-29 ENCOUNTER — Encounter: Payer: Self-pay | Admitting: Adult Health

## 2019-11-29 DIAGNOSIS — M6281 Muscle weakness (generalized): Secondary | ICD-10-CM | POA: Diagnosis not present

## 2019-11-29 DIAGNOSIS — G40909 Epilepsy, unspecified, not intractable, without status epilepticus: Secondary | ICD-10-CM | POA: Diagnosis not present

## 2019-11-29 DIAGNOSIS — I10 Essential (primary) hypertension: Secondary | ICD-10-CM

## 2019-11-29 DIAGNOSIS — D649 Anemia, unspecified: Secondary | ICD-10-CM | POA: Diagnosis not present

## 2019-11-29 DIAGNOSIS — I872 Venous insufficiency (chronic) (peripheral): Secondary | ICD-10-CM | POA: Diagnosis not present

## 2019-11-29 DIAGNOSIS — R350 Frequency of micturition: Secondary | ICD-10-CM

## 2019-11-29 DIAGNOSIS — E1151 Type 2 diabetes mellitus with diabetic peripheral angiopathy without gangrene: Secondary | ICD-10-CM

## 2019-11-29 DIAGNOSIS — E1159 Type 2 diabetes mellitus with other circulatory complications: Secondary | ICD-10-CM

## 2019-11-29 NOTE — Progress Notes (Signed)
Location:  [place] Room Number: 203-A Place of Service:  SNF (31) Provider:  Durenda Age, DNP, FNP-BC  Patient Care Team: Hendricks Limes, MD as PCP - General (Internal Medicine) Medina-Vargas, Senaida Lange, NP as Nurse Practitioner (Internal Medicine)  Extended Emergency Contact Information Primary Emergency Contact: Olamae, Diebel Home Phone: [phone] Relation: Sister Secondary Emergency Contact: Sandria Manly Mobile Phone: [phone] Relation: Niece  Code Status:  FULL CODE  Goals of care: Advanced Directive information Advanced Directives 09/18/2019  Does Patient Have a Medical Advance Directive? Yes  Type of Advance Directive (No Data)  Does patient want to make changes to medical advance directive? No - Patient declined  Would patient like information on creating a medical advance directive? -  Pre-existing out of facility DNR order (yellow form or pink MOST form) -     Chief Complaint  Patient presents with  . Medical Management of Chronic Issues    Routine [facility] visit  . Quality Metric Gaps    Needs colonoscopy, mammogram, DEXA, and diabetic eye exam.     HPI:  Pt is a 69 y.o. female seen today for medical management of chronic diseases.  She is a long-term care resident of [facility].  SHE HAS A PMH of diabetes mellitus type 2, PVD, essential hypertension, GERD, NASH and history of surgery for pituitary microadenoma. She is currently having OT and restorative  walking, AROM of BUE and dressing/grooming. She complained of having frequent urinary incontinence. She denies hematuria nor dysuria.   Past Medical History:  Diagnosis Date  . Allergic rhinitis   . Arthritis    "ankles, feet, knees" (10/30/2017)  . Asthma   . Bursitis   . Carpal tunnel syndrome   . GERD (gastroesophageal reflux disease)   . HTN (hypertension)   . Migraine    "a couple/yr" (10/30/2017)  . Obesity   . Peripheral  vascular disease ([place])   . Pneumonia 1960s X 1  . Sciatica   . Tendinitis   . Type II diabetes mellitus (HCC)    Type II  . UTI (urinary tract infection)    Past Surgical History:  Procedure Laterality Date  . CRANIOTOMY N/A 01/19/2018   Procedure: ENDOSCOPIC TRANSPHENOIDAL RESECTION OF PITUITARY TUMOR;  Surgeon: Consuella Lose, MD;  Location: [place];  Service: Neurosurgery;  Laterality: N/A;  ENDOSCOPIC TRANSPHENOIDAL RESECTION OF PITUITARY TUMOR  . GANGLION CYST EXCISION Left   . KNEE ARTHROSCOPY Left 2003  . PLANTAR FASCIA RELEASE Right 1995   "heel"  . SINUS ENDO WITH FUSION N/A 01/19/2018   Procedure: SINUS ENDO WITH FUSION;  Surgeon: Jerrell Belfast, MD;  Location: [place];  Service: ENT;  Laterality: N/A;  . TONSILLECTOMY  1958  . TRANSPHENOIDAL APPROACH EXPOSURE N/A 01/19/2018   Procedure: TRANSPHENOIDAL  RESECTION OF PITUITARY TUMOR;  Surgeon: Jerrell Belfast, MD;  Location: [place];  Service: ENT;  Laterality: N/A;  . [place]; 1997   Dr [name]    Allergies  Allergen Reactions  . Penicillins Hives and Other (See Comments)    ++ got keflex in 2013 and 2019++ Has patient had a PCN reaction causing immediate rash, facial/tongue/throat swelling, SOB or lightheadedness with hypotension: No Has patient had a PCN reaction causing severe rash involving mucus membranes or skin necrosis: No PATIENT HAS HAD A PCN REACTION THAT REQUIRED HOSPITALIZATION:  #  #  YES  #  #  Has patient had a PCN reaction occurring within the last  10 years: No   . Naproxen     History of GI intolerance with indomethacin.  She is on PPI for GERD.  NSAIDs should not be used long-term or as maintenance medication due to the comorbidities and age as per Beers' List  . Codeine Nausea And Vomiting  . Indomethacin Diarrhea    Outpatient Encounter Medications as of 11/29/2019  Medication Sig  . acetaminophen (TYLENOL) 650 MG CR tablet Take 650 mg by mouth every 6 (six) hours as needed for  pain.  [name] ADVAIR DISKUS 100-50 MCG/DOSE AEPB Inhale 1 puff into the lungs 2 (two) times daily.  [name] albuterol (PROVENTIL HFA;VENTOLIN HFA) 108 (90 Base) MCG/ACT inhaler Inhale 2 puffs into the lungs every 6 (six) hours as needed for wheezing or shortness of breath.  . bisacodyl (DULCOLAX) 10 MG suppository Constipation (2 of 4): If not relieved by MOM, give 10 mg Bisacodyl suppositiory rectally X 1 dose in 24 hours as needed (Do not use constipation standing orders for residents with renal failure/CFR less than 30. Contact MD for orders)  . cetaphil (CETAPHIL) cream Apply 1 application topically at bedtime. Apply to bilateral lower legs for dry skin.  . Cholecalciferol (VITAMIN D3) 125 MCG (5000 UT) CAPS Take 1 capsule by mouth daily.   [name] esomeprazole (NEXIUM) 20 MG capsule Take 1 capsule (20 mg total) by mouth every morning. For acid reflex  . furosemide (LASIX) 40 MG tablet TAKE ONE TABLET BY MOUTH EVERY DAY  . levETIRAcetam (KEPPRA) 500 MG tablet Take 1 tablet (500 mg total) by mouth 2 (two) times daily.  [name] losartan (COZAAR) 100 MG tablet Take 100 mg by mouth daily.  . magnesium hydroxide (MILK OF MAGNESIA) 400 MG/5ML suspension Constipation (1 of 4): If no BM in 3 days, give 30 cc Milk of Magnesium p.o. x 1 dose in 24 hours as needed (Do not use standing constipation orders for residents with renal failure CFR less than 30. Contact MD for orders)  . Menthol, Topical Analgesic, (BIOFREEZE) 4 % GEL Apply 1 application topically 3 (three) times daily. Apply to bilateral knees for osteoarthritis  . metFORMIN (GLUCOPHAGE) 500 MG tablet Take 500 mg by mouth 2 (two) times daily with a meal.  . metoprolol succinate (TOPROL-XL) 25 MG 24 hr tablet Take 50 mg by mouth 2 (two) times daily.   . mometasone (NASONEX) 50 MCG/ACT nasal spray Place 1 spray into the nose as needed.  . protective barrier (RESTORE) CREA Apply topically. Apply barrier cream to buttocks topically BID PRN   No facility-administered  encounter medications on file as of 11/29/2019.    Review of Systems  GENERAL: No change in appetite, no fatigue, no weight changes, no fever or chills  MOUTH and THROAT: Denies oral discomfort, gingival pain or bleeding RESPIRATORY: no cough, SOB, DOE, wheezing, hemoptysis CARDIAC: No chest pain, edema or palpitations GI: No abdominal pain, diarrhea, constipation, heart burn, nausea or vomiting GU: Denies dysuria, +urinary frequency NEUROLOGICAL: Denies dizziness, syncope, numbness, or headache PSYCHIATRIC: Denies feelings of depression or anxiety. No report of hallucinations, insomnia, paranoia, or agitation   Immunization History  Administered Date(s) Administered  . Influenza Split 03/08/2013  . Influenza Whole 09/03/2009, 06/03/2010  . Influenza,inj,Quad PF,6+ Mos 06/25/2014  . Influenza-Unspecified 06/08/2017, 03/25/2018, 05/06/2019  . Moderna SARS-COVID-2 Vaccination 08/12/2019, 09/09/2019  . Pneumococcal Polysaccharide-23 07/25/2002, 10/31/2017  . Pneumococcal-Unspecified 05/07/2019  . Td 07/25/2005  . Tdap 05/03/2019   Pertinent  Health Maintenance Due  Topic Date Due  . COLONOSCOPY  Never done  . MAMMOGRAM  01/14/2017  . DEXA SCAN  Never done  . OPHTHALMOLOGY EXAM  09/08/2019  . HEMOGLOBIN A1C  12/08/2019  . INFLUENZA VACCINE  02/23/2020  . PNA vac Low Risk Adult (2 of 2 - PCV13) 05/06/2020  . FOOT EXAM  05/23/2020   Fall Risk  02/21/2019 03/06/2018 10/20/2017 09/01/2017 08/15/2017  Falls in the past year? (No Data) Yes Yes Yes Yes  Comment Emmi Telephone Survey: data to providers prior to load - - - -  Number falls in past yr: (No Data) 1 2 or more 2 or more 1  Comment Emmi Telephone Survey Actual Response =  - - 7 times  -  Injury with Fall? - Yes Yes No No  Comment - - Broken jaw - -  Risk Factor Category  - High Fall Risk High Fall Risk High Fall Risk High Fall Risk  Risk for fall due to : - Impaired balance/gait History of fall(s);Impaired mobility;Impaired  balance/gait Impaired balance/gait;Impaired mobility Impaired balance/gait;Impaired mobility  Follow up - Falls prevention discussed - Falls prevention discussed Education provided     Vitals:   11/29/19 1112  BP: (!) 144/80  Pulse: 78  Resp: 20  Temp: 97.7 F (36.5 C)  TempSrc: Oral  SpO2: 97%  Weight: 193 lb 9.6 oz (87.8 kg)  Height: 5\' 1"  (1.549 m)   Body mass index is 36.58 kg/m.  Physical Exam  GENERAL APPEARANCE: Well nourished. In no acute distress. Obese SKIN:  Skin is warm and dry.  MOUTH and THROAT: Lips are without lesions. Oral mucosa is moist and without lesions. Tongue is normal in shape, size, and color and without lesions RESPIRATORY: Breathing is even & unlabored, BS CTAB CARDIAC: RRR, no murmur,no extra heart sounds, no edema GI: Abdomen soft, normal BS, no masses, no tenderness EXTREMITIES:  Able to move X 4 extremities  NEUROLOGICAL: There is no tremor. Speech is clear.  PSYCHIATRIC:  Affect and behavior are appropriate  Labs reviewed: Recent Labs    09/10/19 0000  NA 139  K 4.0  CL 103  CO2 25*  BUN 13  CREATININE 1.5*  CALCIUM 9.3   Recent Labs    09/10/19 0000  AST 13  ALT 10  ALKPHOS 104  ALBUMIN 3.6   Recent Labs    09/10/19 0000  WBC 10.9  NEUTROABS 8  HGB 10.9*  HCT 33*  PLT 428*   Lab Results  Component Value Date   TSH 1.89 10/02/2018   Lab Results  Component Value Date   HGBA1C 6.1 09/10/2019   Lab Results  Component Value Date   CHOL 178 08/30/2012   HDL 49 08/30/2012   LDLCALC 92 08/30/2012   TRIG 187 (H) 08/30/2012   CHOLHDL 3.6 08/30/2012    Assessment/Plan  1. Seizure disorder ([place]) -No recent seizures, continue levetiracetam 500 mg 1 tab twice a day -Seizure precautions  2. Diabetes mellitus with peripheral vascular disease ([place]) Lab Results  Component Value Date   HGBA1C 6.1 09/10/2019  -Stable, continue Metformin 500 mg twice a day -Continue Lasix 40 mg daily  3. Hypertension associated  with diabetes ([place]) -Stable, continue metoprolol tartrate 50 mg 1 tab twice a day  4. Anemia, unspecified type Lab Results  Component Value Date   HGB 10.9 (A) 09/10/2019   -Stable  5. Increased urinary frequency - Obtain UA with CS to rule out UTI   Family/ staff Communication:  Discussed plan of care with resident and charge nurse.  Labs/tests ordered: Urinalysis with culture and sensitivity  Goals of care: Long-term care   Durenda Age, DNP, FNP-BC [facility] [phone] (Monday-Friday 8:00 a.m. - 5:00 p.m.) [phone] (after hours)

## 2019-12-02 DIAGNOSIS — I872 Venous insufficiency (chronic) (peripheral): Secondary | ICD-10-CM | POA: Diagnosis not present

## 2019-12-02 DIAGNOSIS — N390 Urinary tract infection, site not specified: Secondary | ICD-10-CM | POA: Diagnosis not present

## 2019-12-02 DIAGNOSIS — M6281 Muscle weakness (generalized): Secondary | ICD-10-CM | POA: Diagnosis not present

## 2019-12-02 DIAGNOSIS — R319 Hematuria, unspecified: Secondary | ICD-10-CM | POA: Diagnosis not present

## 2019-12-03 DIAGNOSIS — I872 Venous insufficiency (chronic) (peripheral): Secondary | ICD-10-CM | POA: Diagnosis not present

## 2019-12-03 DIAGNOSIS — M6281 Muscle weakness (generalized): Secondary | ICD-10-CM | POA: Diagnosis not present

## 2019-12-04 ENCOUNTER — Non-Acute Institutional Stay (SKILLED_NURSING_FACILITY): Payer: Medicare Other | Admitting: Adult Health

## 2019-12-04 ENCOUNTER — Encounter: Payer: Self-pay | Admitting: Adult Health

## 2019-12-04 DIAGNOSIS — G40909 Epilepsy, unspecified, not intractable, without status epilepticus: Secondary | ICD-10-CM | POA: Diagnosis not present

## 2019-12-04 DIAGNOSIS — R600 Localized edema: Secondary | ICD-10-CM | POA: Diagnosis not present

## 2019-12-04 DIAGNOSIS — I10 Essential (primary) hypertension: Secondary | ICD-10-CM

## 2019-12-04 DIAGNOSIS — Z7189 Other specified counseling: Secondary | ICD-10-CM | POA: Diagnosis not present

## 2019-12-04 DIAGNOSIS — E1151 Type 2 diabetes mellitus with diabetic peripheral angiopathy without gangrene: Secondary | ICD-10-CM

## 2019-12-04 DIAGNOSIS — E1159 Type 2 diabetes mellitus with other circulatory complications: Secondary | ICD-10-CM

## 2019-12-04 DIAGNOSIS — M6281 Muscle weakness (generalized): Secondary | ICD-10-CM | POA: Diagnosis not present

## 2019-12-04 DIAGNOSIS — I872 Venous insufficiency (chronic) (peripheral): Secondary | ICD-10-CM | POA: Diagnosis not present

## 2019-12-04 DIAGNOSIS — I152 Hypertension secondary to endocrine disorders: Secondary | ICD-10-CM

## 2019-12-04 NOTE — Progress Notes (Signed)
Location:  [place] Room Number: 203-A Place of Service:  SNF (31) Provider:  Durenda Age, DNP, FNP-BC  Patient Care Team: Hendricks Limes, MD as PCP - General (Internal Medicine) Medina-Vargas, Senaida Lange, NP as Nurse Practitioner (Internal Medicine)  Extended Emergency Contact Information Primary Emergency Contact: Ameyah, Peyer Home Phone: [phone] Relation: Sister Secondary Emergency Contact: Sandria Manly Mobile Phone: [phone] Relation: Niece  Code Status:  FULL CODE  Goals of care: Advanced Directive information Advanced Directives 09/18/2019  Does Patient Have a Medical Advance Directive? Yes  Type of Advance Directive (No Data)  Does patient want to make changes to medical advance directive? No - Patient declined  Would patient like information on creating a medical advance directive? -  Pre-existing out of facility DNR order (yellow form or pink MOST form) -     Chief Complaint  Patient presents with  . Advanced Directive    Patient is seen for a Care Plan Meeting    HPI:  Pt is a 69 y.o. female who had advance care planning today attended by resident, NP, social worker, MDS coordinator, Glass blower/designer, and niece, Philis Nettle, and niece's husband. Resident remains to be full code. Discussed medications, vital signs and weights. Answered queries about medical concerns. She is not an active participant . activities. She prefers independent activities. Latest weight is 193.8 lbs, stable.She had a fall on April 6, 20201. An anti-roll back was attached to her wheelchair. Latest, 11/13/19, BIMS score is 9/15, ranging in moderate cognitive impairment.  She is a long-term care resident of  [facility]. She has a PMH of diabetes mellitus type 2, PVD, essential, GERD, NASH and history of surgery for pituitary microadenoma. The meeting lasted for 25 minutes.   Past Medical History:  Diagnosis Date  . Allergic  rhinitis   . Arthritis    "ankles, feet, knees" (10/30/2017)  . Asthma   . Bursitis   . Carpal tunnel syndrome   . GERD (gastroesophageal reflux disease)   . HTN (hypertension)   . Migraine    "a couple/yr" (10/30/2017)  . Obesity   . Peripheral vascular disease ([place])   . Pneumonia 1960s X 1  . Sciatica   . Tendinitis   . Type II diabetes mellitus (HCC)    Type II  . UTI (urinary tract infection)    Past Surgical History:  Procedure Laterality Date  . CRANIOTOMY N/A 01/19/2018   Procedure: ENDOSCOPIC TRANSPHENOIDAL RESECTION OF PITUITARY TUMOR;  Surgeon: Consuella Lose, MD;  Location: [place];  Service: Neurosurgery;  Laterality: N/A;  ENDOSCOPIC TRANSPHENOIDAL RESECTION OF PITUITARY TUMOR  . GANGLION CYST EXCISION Left   . KNEE ARTHROSCOPY Left 2003  . PLANTAR FASCIA RELEASE Right 1995   "heel"  . SINUS ENDO WITH FUSION N/A 01/19/2018   Procedure: SINUS ENDO WITH FUSION;  Surgeon: Jerrell Belfast, MD;  Location: [place];  Service: ENT;  Laterality: N/A;  . TONSILLECTOMY  1958  . TRANSPHENOIDAL APPROACH EXPOSURE N/A 01/19/2018   Procedure: TRANSPHENOIDAL  RESECTION OF PITUITARY TUMOR;  Surgeon: Jerrell Belfast, MD;  Location: [place];  Service: ENT;  Laterality: N/A;  . [place]; 1997   Dr [name]    Allergies  Allergen Reactions  . Penicillins Hives and Other (See Comments)    ++ got keflex in 2013 and 2019++ Has patient had a PCN reaction causing immediate rash, facial/tongue/throat swelling, SOB or lightheadedness with hypotension: No Has patient had a PCN reaction causing severe  rash involving mucus membranes or skin necrosis: No PATIENT HAS HAD A PCN REACTION THAT REQUIRED HOSPITALIZATION:  #  #  YES  #  #  Has patient had a PCN reaction occurring within the last 10 years: No   . Naproxen     History of GI intolerance with indomethacin.  She is on PPI for GERD.  NSAIDs should not be used long-term or as maintenance medication due to the comorbidities and  age as per Beers' List  . Codeine Nausea And Vomiting  . Indomethacin Diarrhea    Outpatient Encounter Medications as of 12/04/2019  Medication Sig  . acetaminophen (TYLENOL) 650 MG CR tablet Take 650 mg by mouth every 6 (six) hours as needed for pain.  [name] ADVAIR DISKUS 100-50 MCG/DOSE AEPB Inhale 1 puff into the lungs 2 (two) times daily.  [name] albuterol (PROVENTIL HFA;VENTOLIN HFA) 108 (90 Base) MCG/ACT inhaler Inhale 2 puffs into the lungs every 6 (six) hours as needed for wheezing or shortness of breath.  . bisacodyl (DULCOLAX) 10 MG suppository Constipation (2 of 4): If not relieved by MOM, give 10 mg Bisacodyl suppositiory rectally X 1 dose in 24 hours as needed (Do not use constipation standing orders for residents with renal failure/CFR less than 30. Contact MD for orders)  . cetaphil (CETAPHIL) cream Apply 1 application topically at bedtime. Apply to bilateral lower legs for dry skin.  . Cholecalciferol (VITAMIN D3) 125 MCG (5000 UT) CAPS Take 1 capsule by mouth daily.   [name] esomeprazole (NEXIUM) 20 MG capsule Take 1 capsule (20 mg total) by mouth every morning. For acid reflex  . furosemide (LASIX) 40 MG tablet TAKE ONE TABLET BY MOUTH EVERY DAY  . levETIRAcetam (KEPPRA) 500 MG tablet Take 1 tablet (500 mg total) by mouth 2 (two) times daily.  [name] losartan (COZAAR) 100 MG tablet Take 100 mg by mouth daily.  . magnesium hydroxide (MILK OF MAGNESIA) 400 MG/5ML suspension Constipation (1 of 4): If no BM in 3 days, give 30 cc Milk of Magnesium p.o. x 1 dose in 24 hours as needed (Do not use standing constipation orders for residents with renal failure CFR less than 30. Contact MD for orders)  . Menthol, Topical Analgesic, (BIOFREEZE) 4 % GEL Apply 1 application topically 3 (three) times daily. Apply to bilateral knees for osteoarthritis  . metFORMIN (GLUCOPHAGE) 500 MG tablet Take 500 mg by mouth 2 (two) times daily with a meal.  . metoprolol succinate (TOPROL-XL) 25 MG 24 hr tablet Take 50 mg by  mouth 2 (two) times daily.   . mometasone (NASONEX) 50 MCG/ACT nasal spray Place 1 spray into the nose as needed.  . protective barrier (RESTORE) CREA Apply topically. Apply barrier cream to buttocks topically BID PRN   No facility-administered encounter medications on file as of 12/04/2019.    Review of Systems  GENERAL: No change in appetite, no fatigue, no weight changes, no fever, chills or weakness MOUTH and THROAT: Denies oral discomfort, gingival pain or bleeding, pain from teeth or hoarseness   RESPIRATORY: no cough, SOB, DOE, wheezing, hemoptysis CARDIAC: No chest pain, edema or palpitations GI: No abdominal pain, diarrhea, constipation, heart burn, nausea or vomiting GU: Denies dysuria, frequency, hematuria, incontinence, or discharge NEUROLOGICAL: Denies dizziness, syncope, numbness, or headache PSYCHIATRIC: Denies feelings of depression or anxiety. No report of hallucinations, insomnia, paranoia, or agitation   Immunization History  Administered Date(s) Administered  . Influenza Split 03/08/2013  . Influenza Whole 09/03/2009, 06/03/2010  . Influenza,inj,Quad PF,6+  Mos 06/25/2014  . Influenza-Unspecified 06/08/2017, 03/25/2018, 05/06/2019  . Moderna SARS-COVID-2 Vaccination 08/12/2019, 09/09/2019  . Pneumococcal Polysaccharide-23 07/25/2002, 10/31/2017  . Pneumococcal-Unspecified 05/07/2019  . Td 07/25/2005  . Tdap 05/03/2019   Pertinent  Health Maintenance Due  Topic Date Due  . COLONOSCOPY  Never done  . MAMMOGRAM  01/14/2017  . DEXA SCAN  Never done  . OPHTHALMOLOGY EXAM  09/08/2019  . HEMOGLOBIN A1C  12/08/2019  . INFLUENZA VACCINE  02/23/2020  . PNA vac Low Risk Adult (2 of 2 - PCV13) 05/06/2020  . FOOT EXAM  05/23/2020   Fall Risk  02/21/2019 03/06/2018 10/20/2017 09/01/2017 08/15/2017  Falls in the past year? (No Data) Yes Yes Yes Yes  Comment Emmi Telephone Survey: data to providers prior to load - - - -  Number falls in past yr: (No Data) 1 2 or more 2 or  more 1  Comment Emmi Telephone Survey Actual Response =  - - 7 times  -  Injury with Fall? - Yes Yes No No  Comment - - Broken jaw - -  Risk Factor Category  - High Fall Risk High Fall Risk High Fall Risk High Fall Risk  Risk for fall due to : - Impaired balance/gait History of fall(s);Impaired mobility;Impaired balance/gait Impaired balance/gait;Impaired mobility Impaired balance/gait;Impaired mobility  Follow up - Falls prevention discussed - Falls prevention discussed Education provided     Vitals:   12/04/19 0959  BP: 120/76  Pulse: 76  Resp: 20  Temp: 97.7 F (36.5 C)  TempSrc: Oral  Weight: 193 lb 12.8 oz (87.9 kg)  Height: 5\' 1"  (1.549 m)   Body mass index is 36.62 kg/m.  Physical Exam  GENERAL APPEARANCE: Well nourished. In no acute distress. Obese SKIN:  Skin is warm and dry.  MOUTH and THROAT: Lips are without lesions. Oral mucosa is moist and without lesions. Tongue is normal in shape, size, and color and without lesions RESPIRATORY: Breathing is even & unlabored, BS CTAB CARDIAC: RRR, no murmur,no extra heart sounds, BLE 2+ edema GI: Abdomen soft, normal BS, no masses, no tenderness EXTREMITIES:  Able to move X 4 extremities NEUROLOGICAL: There is no tremor. Speech is clear. PSYCHIATRIC:  Affect and behavior are appropriate  Labs reviewed: Recent Labs    09/10/19 0000  NA 139  K 4.0  CL 103  CO2 25*  BUN 13  CREATININE 1.5*  CALCIUM 9.3   Recent Labs    09/10/19 0000  AST 13  ALT 10  ALKPHOS 104  ALBUMIN 3.6   Recent Labs    09/10/19 0000  WBC 10.9  NEUTROABS 8  HGB 10.9*  HCT 33*  PLT 428*   Lab Results  Component Value Date   TSH 1.89 10/02/2018   Lab Results  Component Value Date   HGBA1C 6.1 09/10/2019   Lab Results  Component Value Date   CHOL 178 08/30/2012   HDL 49 08/30/2012   LDLCALC 92 08/30/2012   TRIG 187 (H) 08/30/2012   CHOLHDL 3.6 08/30/2012    Assessment/Plan  1. ACP (advance care planning) - remains to  be full code -Discussed medications, vital signs and weights  2. Diabetes mellitus with peripheral vascular disease ([place]) Lab Results  Component Value Date   HGBA1C 6.1 09/10/2019   -Controlled, continue Metformin  3. Seizure disorder ([place]) -No recent seizures, continue levetiracetam  4. Hypertension associated with diabetes ([place]) -Stable, continue metoprolol tartrate and losartan  5. Lower extremity edema -Stable, continue furosemide  Family/ staff Communication: Discussed plan of care with resident, niece and niece's husband and IDT.  Labs/tests ordered: None  Goals of care:   Long-term care  Durenda Age, DNP, FNP-BC [facility] [phone] (Monday-Friday 8:00 a.m. - 5:00 p.m.) [phone] (after hours)

## 2019-12-05 DIAGNOSIS — M6281 Muscle weakness (generalized): Secondary | ICD-10-CM | POA: Diagnosis not present

## 2019-12-05 DIAGNOSIS — I872 Venous insufficiency (chronic) (peripheral): Secondary | ICD-10-CM | POA: Diagnosis not present

## 2019-12-06 DIAGNOSIS — M6281 Muscle weakness (generalized): Secondary | ICD-10-CM | POA: Diagnosis not present

## 2019-12-06 DIAGNOSIS — I872 Venous insufficiency (chronic) (peripheral): Secondary | ICD-10-CM | POA: Diagnosis not present

## 2019-12-09 DIAGNOSIS — M6281 Muscle weakness (generalized): Secondary | ICD-10-CM | POA: Diagnosis not present

## 2019-12-09 DIAGNOSIS — M8589 Other specified disorders of bone density and structure, multiple sites: Secondary | ICD-10-CM | POA: Diagnosis not present

## 2019-12-09 DIAGNOSIS — I872 Venous insufficiency (chronic) (peripheral): Secondary | ICD-10-CM | POA: Diagnosis not present

## 2019-12-10 DIAGNOSIS — M6281 Muscle weakness (generalized): Secondary | ICD-10-CM | POA: Diagnosis not present

## 2019-12-10 DIAGNOSIS — I872 Venous insufficiency (chronic) (peripheral): Secondary | ICD-10-CM | POA: Diagnosis not present

## 2019-12-11 DIAGNOSIS — I872 Venous insufficiency (chronic) (peripheral): Secondary | ICD-10-CM | POA: Diagnosis not present

## 2019-12-11 DIAGNOSIS — F4323 Adjustment disorder with mixed anxiety and depressed mood: Secondary | ICD-10-CM | POA: Diagnosis not present

## 2019-12-11 DIAGNOSIS — M6281 Muscle weakness (generalized): Secondary | ICD-10-CM | POA: Diagnosis not present

## 2019-12-12 DIAGNOSIS — M6281 Muscle weakness (generalized): Secondary | ICD-10-CM | POA: Diagnosis not present

## 2019-12-12 DIAGNOSIS — I872 Venous insufficiency (chronic) (peripheral): Secondary | ICD-10-CM | POA: Diagnosis not present

## 2019-12-13 DIAGNOSIS — M6281 Muscle weakness (generalized): Secondary | ICD-10-CM | POA: Diagnosis not present

## 2019-12-13 DIAGNOSIS — I872 Venous insufficiency (chronic) (peripheral): Secondary | ICD-10-CM | POA: Diagnosis not present

## 2019-12-16 DIAGNOSIS — M6281 Muscle weakness (generalized): Secondary | ICD-10-CM | POA: Diagnosis not present

## 2019-12-16 DIAGNOSIS — I872 Venous insufficiency (chronic) (peripheral): Secondary | ICD-10-CM | POA: Diagnosis not present

## 2019-12-17 DIAGNOSIS — I872 Venous insufficiency (chronic) (peripheral): Secondary | ICD-10-CM | POA: Diagnosis not present

## 2019-12-17 DIAGNOSIS — M6281 Muscle weakness (generalized): Secondary | ICD-10-CM | POA: Diagnosis not present

## 2019-12-18 DIAGNOSIS — F4323 Adjustment disorder with mixed anxiety and depressed mood: Secondary | ICD-10-CM | POA: Diagnosis not present

## 2019-12-25 DIAGNOSIS — F4323 Adjustment disorder with mixed anxiety and depressed mood: Secondary | ICD-10-CM | POA: Diagnosis not present

## 2019-12-31 ENCOUNTER — Encounter: Payer: Self-pay | Admitting: Internal Medicine

## 2019-12-31 ENCOUNTER — Non-Acute Institutional Stay (SKILLED_NURSING_FACILITY): Payer: Medicare Other | Admitting: Internal Medicine

## 2019-12-31 DIAGNOSIS — D649 Anemia, unspecified: Secondary | ICD-10-CM | POA: Diagnosis not present

## 2019-12-31 DIAGNOSIS — E119 Type 2 diabetes mellitus without complications: Secondary | ICD-10-CM | POA: Diagnosis not present

## 2019-12-31 DIAGNOSIS — D352 Benign neoplasm of pituitary gland: Secondary | ICD-10-CM

## 2019-12-31 DIAGNOSIS — E559 Vitamin D deficiency, unspecified: Secondary | ICD-10-CM

## 2019-12-31 DIAGNOSIS — I10 Essential (primary) hypertension: Secondary | ICD-10-CM

## 2019-12-31 DIAGNOSIS — I152 Hypertension secondary to endocrine disorders: Secondary | ICD-10-CM

## 2019-12-31 DIAGNOSIS — E1159 Type 2 diabetes mellitus with other circulatory complications: Secondary | ICD-10-CM | POA: Diagnosis not present

## 2019-12-31 NOTE — Assessment & Plan Note (Addendum)
On 2/16 A1c was 6.1% indicating prediabetes.  She is on Metformin 500 mg once daily in the context of CKD. BMET be rechecked.

## 2019-12-31 NOTE — Assessment & Plan Note (Addendum)
Serial blood blood pressures continue to exhibit systolic greater than 068.  Hydralazine 25 mg twice daily will be added to the present regimen.  Amlodipine is not an option because of peripheral edema.  Spironolactone is contraindicated due to CKD Diabetes is well controlled. Lab update indicated.

## 2019-12-31 NOTE — Assessment & Plan Note (Signed)
Vitamin D level therapeutic on 2/16. No change in present dose.

## 2019-12-31 NOTE — Assessment & Plan Note (Signed)
CBC will be rechecked.  No bleeding dyscrasias reported.

## 2019-12-31 NOTE — Patient Instructions (Signed)
See assessment and plan under each diagnosis in the problem list and acutely for this visit 

## 2019-12-31 NOTE — Progress Notes (Signed)
NURSING HOME LOCATION:  [facility] ROOM NUMBER:  203-A  CODE STATUS:  FULL CODE  PCP:  Hendricks Limes, MD  [place] [address]   This is a nursing facility follow up of chronic medical diagnoses.  Interim medical record and care since last [place] visit was updated with review of diagnostic studies and change in clinical status since last visit were documented.  HPI: She is a permanent resident of the facility with medical diagnoses of essential hypertension, GERD, chronic venous insufficiency with edema, extrinsic asthma, NASH, history of seizure disorder, dementia with behavioral disorders, and diabetes with vascular complications. Significantly she had a pituitary adenoma resected in 2019.  Last labs were 09/10/2019.  CKD stage III was present with a creatinine of 1.5 and GFR 42.16.  She had anemia with hemoglobin 10.9/hematocrit 33.  A1c was prediabetic at 6.1%. Glucoses recently have ranged from 89 up to 163.  The latter was an outlier.  All other values are less than 110.  She is on Metformin 500 mg once daily.  She has exhibited hypertension with ranges of 138/66 up to 162/84.  The systolic tends to be higher than 140 despite metoprolol 50 mg twice daily and losartan 100 mg daily..  Review of systems: Dementia invalidated responses. Date given as November 04, 1999.  She states the effusion of the right knee has improved.  She denies any active cardiopulmonary symptoms in relation to the edema. She made the comment that her "thigh will get lighter then darker".  This is 1 instance of repeated confabulation.  Her concern was "situation with my house".  She made the statement that apparently "at the sale I had "written low".   She does describe occasional constipation.  Constitutional: No fever, significant weight change, fatigue  Eyes: No redness, discharge, pain, vision change ENT/mouth: No nasal congestion,  purulent discharge, earache,  change in hearing, sore throat  Cardiovascular: No chest pain, palpitations, paroxysmal nocturnal dyspnea, claudication  Respiratory: No cough, sputum production, hemoptysis, DOE, significant snoring, apnea   Gastrointestinal: No heartburn, dysphagia, abdominal pain, nausea /vomiting, rectal bleeding, melena Genitourinary: No dysuria, hematuria, pyuria, incontinence, nocturia Dermatologic: No new rash, pruritus Neurologic: No dizziness, headache, syncope, seizures, numbness, tingling Psychiatric: No significant anxiety, depression, insomnia, anorexia Endocrine: No change in hair/skin/nails, excessive thirst, excessive hunger, excessive urination  Hematologic/lymphatic: No significant bruising, lymphadenopathy, abnormal bleeding Allergy/immunology: No itchy/watery eyes, significant sneezing, urticaria, angioedema  Physical exam:  Pertinent or positive findings: Affect is flat.  There is no change in the facial expression as she confabulates nonsensically.  Slight gallop cadence is suggested.  She has musical rhonchi which are diffuse and homogenous.  There is tight, nonpitting edema of the lower extremities.  There is some hyperpigmentation from the upper shins down to the feet.  Pedal pulses are not palpable.  The right knee is fusiform without definite effusion.  When she walks she ambulates slowly using items such as W/C & bed for support.  Valgus deformity is suggested.  General appearance: Adequately nourished; no acute distress, increased work of breathing is present.   Lymphatic: No lymphadenopathy about the head, neck, axilla. Eyes: No conjunctival inflammation or lid edema is present. There is no scleral icterus. Ears:  External ear exam shows no significant lesions or deformities.   Nose:  External nasal examination shows no deformity or inflammation. Nasal mucosa are pink and moist without lesions, exudates Oral exam:  Lips and gums are healthy appearing. There is  no oropharyngeal  erythema or exudate. Neck:  No thyromegaly, masses, tenderness noted.    Heart:  without  murmur, click, rub .  Lungs:  without wheezes, rales, rubs. Abdomen: Bowel sounds are normal. Abdomen is soft and nontender with no organomegaly, hernias, masses. GU: Deferred  Extremities:  No cyanosis, clubbing  Neurologic exam : Balance, Rhomberg, finger to nose testing could not be completed due to clinical state Skin: Warm & dry w/o tenting. No significant lesions   See summary under each active problem in the Problem List with associated updated therapeutic plan

## 2019-12-31 NOTE — Assessment & Plan Note (Signed)
Update TSH, free T3, and free T4.

## 2020-01-01 DIAGNOSIS — G3184 Mild cognitive impairment, so stated: Secondary | ICD-10-CM | POA: Diagnosis not present

## 2020-01-01 DIAGNOSIS — F4323 Adjustment disorder with mixed anxiety and depressed mood: Secondary | ICD-10-CM | POA: Diagnosis not present

## 2020-01-01 LAB — BASIC METABOLIC PANEL
BUN: 13 (ref 4–21)
CO2: 25 — AB (ref 13–22)
Chloride: 101 (ref 99–108)
Creatinine: 1.2 — AB (ref 0.5–1.1)
Glucose: 109
Potassium: 3.6 (ref 3.4–5.3)
Sodium: 139 (ref 137–147)

## 2020-01-01 LAB — COMPREHENSIVE METABOLIC PANEL
Calcium: 9.6 (ref 8.7–10.7)
GFR calc Af Amer: 54.76
GFR calc non Af Amer: 47.24

## 2020-01-01 LAB — CBC AND DIFFERENTIAL
HCT: 31 — AB (ref 36–46)
Hemoglobin: 10.1 — AB (ref 12.0–16.0)
Neutrophils Absolute: 6
Platelets: 389 (ref 150–399)
WBC: 9.3

## 2020-01-01 LAB — CBC: RBC: 3.45 — AB (ref 3.87–5.11)

## 2020-01-01 LAB — TSH: TSH: 2.34 (ref 0.41–5.90)

## 2020-01-08 DIAGNOSIS — F4323 Adjustment disorder with mixed anxiety and depressed mood: Secondary | ICD-10-CM | POA: Diagnosis not present

## 2020-01-08 DIAGNOSIS — G3184 Mild cognitive impairment, so stated: Secondary | ICD-10-CM | POA: Diagnosis not present

## 2020-01-23 DIAGNOSIS — I872 Venous insufficiency (chronic) (peripheral): Secondary | ICD-10-CM | POA: Diagnosis not present

## 2020-01-23 DIAGNOSIS — M6281 Muscle weakness (generalized): Secondary | ICD-10-CM | POA: Diagnosis not present

## 2020-01-24 DIAGNOSIS — M6281 Muscle weakness (generalized): Secondary | ICD-10-CM | POA: Diagnosis not present

## 2020-01-24 DIAGNOSIS — I872 Venous insufficiency (chronic) (peripheral): Secondary | ICD-10-CM | POA: Diagnosis not present

## 2020-01-27 DIAGNOSIS — M6281 Muscle weakness (generalized): Secondary | ICD-10-CM | POA: Diagnosis not present

## 2020-01-27 DIAGNOSIS — I872 Venous insufficiency (chronic) (peripheral): Secondary | ICD-10-CM | POA: Diagnosis not present

## 2020-01-28 ENCOUNTER — Non-Acute Institutional Stay (SKILLED_NURSING_FACILITY): Payer: Medicare Other | Admitting: Adult Health

## 2020-01-28 ENCOUNTER — Encounter: Payer: Self-pay | Admitting: Adult Health

## 2020-01-28 DIAGNOSIS — J45909 Unspecified asthma, uncomplicated: Secondary | ICD-10-CM

## 2020-01-28 DIAGNOSIS — M6281 Muscle weakness (generalized): Secondary | ICD-10-CM | POA: Diagnosis not present

## 2020-01-28 DIAGNOSIS — I872 Venous insufficiency (chronic) (peripheral): Secondary | ICD-10-CM | POA: Diagnosis not present

## 2020-01-28 DIAGNOSIS — G40909 Epilepsy, unspecified, not intractable, without status epilepticus: Secondary | ICD-10-CM | POA: Diagnosis not present

## 2020-01-28 DIAGNOSIS — E119 Type 2 diabetes mellitus without complications: Secondary | ICD-10-CM | POA: Diagnosis not present

## 2020-01-28 DIAGNOSIS — R600 Localized edema: Secondary | ICD-10-CM | POA: Diagnosis not present

## 2020-01-28 DIAGNOSIS — K219 Gastro-esophageal reflux disease without esophagitis: Secondary | ICD-10-CM

## 2020-01-28 DIAGNOSIS — E1159 Type 2 diabetes mellitus with other circulatory complications: Secondary | ICD-10-CM | POA: Diagnosis not present

## 2020-01-28 DIAGNOSIS — I10 Essential (primary) hypertension: Secondary | ICD-10-CM

## 2020-01-28 NOTE — Progress Notes (Signed)
Location:  [place] Room Number: 203-A Place of Service:  SNF (31) Provider:  Durenda Age, DNP, FNP-BC  Patient Care Team: Hendricks Limes, MD as PCP - General (Internal Medicine) Medina-Vargas, Senaida Lange, NP as Nurse Practitioner (Internal Medicine)  Extended Emergency Contact Information Primary Emergency Contact: Elanor, Cale Home Phone: [phone] Relation: Sister Secondary Emergency Contact: Sandria Manly Mobile Phone: [phone] Relation: Niece  Code Status:  FULL CODE  Goals of care: Advanced Directive information Advanced Directives 09/18/2019  Does Patient Have a Medical Advance Directive? Yes  Type of Advance Directive (No Data)  Does patient want to make changes to medical advance directive? No - Patient declined  Would patient like information on creating a medical advance directive? -  Pre-existing out of facility DNR order (yellow form or pink MOST form) -     Chief Complaint  Patient presents with  . Medical Management of Chronic Issues    Routine [facility] visit    HPI:  Pt is a 69 y.o. female seen today for medical management of chronic diseases.  She is a long-term care resident of [facility].  He has a PMH of diabetes mellitus type 2, PVD, essential hypertension, GERD, NASH and history of pituitary microadenoma. She was seen in her room today. She had a fall incident yesterday, 01/27/20. Left toe has 1+ edema. Imaging on left hip and left foot was negative for fracture.  He is participating in restorative programs such as 1) AROM BUE, and 2) walking 15 feet x 3 trials.  No reported seizures.  She takes levetiracetam for seizure.[name]  SBPs ranging from 122-146 with an outlier 156.  She continues to take metoprolol tartrate, losartan and hydralazine for hypertension.  Latest BIMS score 9/15, ranging in the moderately impaired cognitive deficit.   Past Medical History:  Diagnosis Date  .  Allergic rhinitis   . Arthritis    "ankles, feet, knees" (10/30/2017)  . Asthma   . Bursitis   . Carpal tunnel syndrome   . GERD (gastroesophageal reflux disease)   . HTN (hypertension)   . Migraine    "a couple/yr" (10/30/2017)  . Obesity   . Peripheral vascular disease ([place])   . Pneumonia 1960s X 1  . Sciatica   . Tendinitis   . Type II diabetes mellitus (HCC)    Type II  . UTI (urinary tract infection)    Past Surgical History:  Procedure Laterality Date  . CRANIOTOMY N/A 01/19/2018   Procedure: ENDOSCOPIC TRANSPHENOIDAL RESECTION OF PITUITARY TUMOR;  Surgeon: Consuella Lose, MD;  Location: [facility];  Service: Neurosurgery;  Laterality: N/A;  ENDOSCOPIC TRANSPHENOIDAL RESECTION OF PITUITARY TUMOR  . GANGLION CYST EXCISION Left   . KNEE ARTHROSCOPY Left 2003  . PLANTAR FASCIA RELEASE Right 1995   "heel"  . SINUS ENDO WITH FUSION N/A 01/19/2018   Procedure: SINUS ENDO WITH FUSION;  Surgeon: Jerrell Belfast, MD;  Location: [facility];  Service: ENT;  Laterality: N/A;  . TONSILLECTOMY  1958  . TRANSPHENOIDAL APPROACH EXPOSURE N/A 01/19/2018   Procedure: TRANSPHENOIDAL  RESECTION OF PITUITARY TUMOR;  Surgeon: Jerrell Belfast, MD;  Location: [facility];  Service: ENT;  Laterality: N/A;  . [place]; 1997   Dr [name]    Allergies  Allergen Reactions  . Penicillins Hives and Other (See Comments)    ++ got keflex in 2013 and 2019++ Has patient had a PCN reaction causing immediate rash, facial/tongue/throat swelling, SOB or lightheadedness with  hypotension: No Has patient had a PCN reaction causing severe rash involving mucus membranes or skin necrosis: No PATIENT HAS HAD A PCN REACTION THAT REQUIRED HOSPITALIZATION:  #  #  YES  #  #  Has patient had a PCN reaction occurring within the last 10 years: No   . Naproxen     History of GI intolerance with indomethacin.  She is on PPI for GERD.  NSAIDs should not be used long-term or as maintenance medication due to the  comorbidities and age as per [place] List  . Codeine Nausea And Vomiting  . Indomethacin Diarrhea    Outpatient Encounter Medications as of 01/28/2020  Medication Sig  . acetaminophen (TYLENOL) 650 MG CR tablet Take 650 mg by mouth every 6 (six) hours as needed for pain.  [name] ADVAIR DISKUS 100-50 MCG/DOSE AEPB Inhale 1 puff into the lungs 2 (two) times daily.  [name] albuterol (PROVENTIL HFA;VENTOLIN HFA) 108 (90 Base) MCG/ACT inhaler Inhale 2 puffs into the lungs every 6 (six) hours as needed for wheezing or shortness of breath.  . bisacodyl (DULCOLAX) 10 MG suppository Constipation (2 of 4): If not relieved by MOM, give 10 mg Bisacodyl suppositiory rectally X 1 dose in 24 hours as needed (Do not use constipation standing orders for residents with renal failure/CFR less than 30. Contact MD for orders)  . cetaphil (CETAPHIL) cream Apply 1 application topically at bedtime. Apply to bilateral lower legs for dry skin.  . Cholecalciferol (VITAMIN D3) 125 MCG (5000 UT) CAPS Take 1 capsule by mouth daily.   [name] esomeprazole (NEXIUM) 20 MG capsule Take 1 capsule (20 mg total) by mouth every morning. For acid reflex  . furosemide (LASIX) 40 MG tablet TAKE ONE TABLET BY MOUTH EVERY DAY  . hydrALAZINE (APRESOLINE) 25 MG tablet Take 25 mg by mouth in the morning and at bedtime.  . levETIRAcetam (KEPPRA) 500 MG tablet Take 1 tablet (500 mg total) by mouth 2 (two) times daily.  [name] losartan (COZAAR) 100 MG tablet Take 100 mg by mouth daily.  . magnesium hydroxide (MILK OF MAGNESIA) 400 MG/5ML suspension Constipation (1 of 4): If no BM in 3 days, give 30 cc Milk of Magnesium p.o. x 1 dose in 24 hours as needed (Do not use standing constipation orders for residents with renal failure CFR less than 30. Contact MD for orders)  . Menthol, Topical Analgesic, (BIOFREEZE) 4 % GEL Apply 1 application topically 3 (three) times daily. Apply to bilateral knees for osteoarthritis  . metFORMIN (GLUCOPHAGE) 500 MG tablet Take 500 mg by  mouth 2 (two) times daily with a meal.  . metoprolol succinate (TOPROL-XL) 25 MG 24 hr tablet Take 50 mg by mouth 2 (two) times daily.   . mometasone (NASONEX) 50 MCG/ACT nasal spray Place 1 spray into the nose as needed.  . protective barrier (RESTORE) CREA Apply topically. Apply barrier cream to buttocks topically BID PRN  . Sodium Fluoride (DENTA 5000 PLUS DT) Place 1 application onto teeth every evening.   No facility-administered encounter medications on file as of 01/28/2020.    Review of Systems  GENERAL: No change in appetite, no fatigue, no weight changes, no fever, chills or weakness MOUTH and THROAT: Denies oral discomfort, gingival pain or bleeding RESPIRATORY: no cough, SOB, DOE, wheezing, hemoptysis CARDIAC: No chest pain or palpitations GI: No abdominal pain, diarrhea, constipation, heart burn, nausea or vomiting GU: Denies dysuria, frequency, hematuria, incontinence, or discharge NEUROLOGICAL: Denies dizziness, syncope, numbness, or headache PSYCHIATRIC: Denies feelings  of depression or anxiety. No report of hallucinations, insomnia, paranoia, or agitation   Immunization History  Administered Date(s) Administered  . Influenza Split 03/08/2013  . Influenza Whole 09/03/2009, 06/03/2010  . Influenza,inj,Quad PF,6+ Mos 06/25/2014  . Influenza-Unspecified 06/08/2017, 03/25/2018, 05/06/2019  . Moderna SARS-COVID-2 Vaccination 08/12/2019, 09/09/2019  . Pneumococcal Polysaccharide-23 07/25/2002, 10/31/2017  . Pneumococcal-Unspecified 05/07/2019  . Td 07/25/2005  . Tdap 05/03/2019   Pertinent  Health Maintenance Due  Topic Date Due  . COLONOSCOPY  Never done  . MAMMOGRAM  01/14/2017  . DEXA SCAN  Never done  . OPHTHALMOLOGY EXAM  09/08/2019  . HEMOGLOBIN A1C  12/08/2019  . INFLUENZA VACCINE  02/23/2020  . PNA vac Low Risk Adult (2 of 2 - PCV13) 05/06/2020  . FOOT EXAM  05/23/2020   Fall Risk  02/21/2019 03/06/2018 10/20/2017 09/01/2017 08/15/2017  Falls in the past year?  (No Data) Yes Yes Yes Yes  Comment Emmi Telephone Survey: data to providers prior to load - - - -  Number falls in past yr: (No Data) 1 2 or more 2 or more 1  Comment Emmi Telephone Survey Actual Response =  - - 7 times  -  Injury with Fall? - Yes Yes No No  Comment - - Broken jaw - -  Risk Factor Category  - High Fall Risk High Fall Risk High Fall Risk High Fall Risk  Risk for fall due to : - Impaired balance/gait History of fall(s);Impaired mobility;Impaired balance/gait Impaired balance/gait;Impaired mobility Impaired balance/gait;Impaired mobility  Follow up - Falls prevention discussed - Falls prevention discussed Education provided     Vitals:   01/28/20 1522  BP: 138/73  Pulse: 84  Resp: 20  Temp: (!) 97.1 F (36.2 C)  TempSrc: Oral  Weight: 201 lb 12.8 oz (91.5 kg)  Height: 5\' 1"  (1.549 m)   Body mass index is 38.13 kg/m.  Physical Exam  GENERAL APPEARANCE: Well nourished. In no acute distress.  Morbidly obese  SKIN:  Skin is warm and dry.  MOUTH and THROAT: Lips are without lesions. Oral mucosa is moist and without lesions. Tongue is normal in shape, size, and color and without lesions RESPIRATORY: Breathing is even & unlabored, BS CTAB CARDIAC: RRR, no murmur,no extra heart sounds, BLE 2+edema GI: Abdomen soft, normal BS, no masses, no tenderness EXTREMITIES: Able to move x4 extremities NEUROLOGICAL: There is no tremor. Speech is clear.  Alert to self, disoriented to time and place PSYCHIATRIC:  Affect and behavior are appropriate  Labs reviewed: Recent Labs    09/10/19 0000 01/01/20 0000  NA 139 139  K 4.0 3.6  CL 103 101  CO2 25* 25*  BUN 13 13  CREATININE 1.5* 1.2*  CALCIUM 9.3 9.6   Recent Labs    09/10/19 0000  AST 13  ALT 10  ALKPHOS 104  ALBUMIN 3.6   Recent Labs    09/10/19 0000 01/01/20 0000  WBC 10.9 9.3  NEUTROABS 8 6  HGB 10.9* 10.1*  HCT 33* 31*  PLT 428* 389   Lab Results  Component Value Date   TSH 2.34 01/01/2020   Lab  Results  Component Value Date   HGBA1C 6.1 09/10/2019   Lab Results  Component Value Date   CHOL 178 08/30/2012   HDL 49 08/30/2012   LDLCALC 92 08/30/2012   TRIG 187 (H) 08/30/2012   CHOLHDL 3.6 08/30/2012    Assessment/Plan  1. Lower extremity edema -Stable, continue furosemide -Encouraged to elevate legs when sitting down for  a long time and when in bed  2. Type 2 diabetes mellitus treated without insulin (HCC) Lab Results  Component Value Date   HGBA1C 6.1 09/10/2019   -  Stable, continue Metformin  3. Seizure disorder ([place]) -  No reported seizures, continue levetiracetam  4. Hypertension associated with diabetes ([place]) -Stable, continue metoprolol tartrate, losartan and hydralazine  5. Gastroesophageal reflux disease without esophagitis -  Stable, continue esomeprazole  6. Mild asthma without complication, unspecified whether persistent -No SOB, continue Advair and PRN albuterol     Family/ staff Communication: Discussed plan of care with resident and charge nurse.  Labs/tests ordered: None  Goals of care:   Long-term care   Durenda Age, DNP, FNP-BC [facility] [phone] (Monday-Friday 8:00 a.m. - 5:00 p.m.) [phone] (after hours)

## 2020-01-29 DIAGNOSIS — G3184 Mild cognitive impairment, so stated: Secondary | ICD-10-CM | POA: Diagnosis not present

## 2020-01-29 DIAGNOSIS — I872 Venous insufficiency (chronic) (peripheral): Secondary | ICD-10-CM | POA: Diagnosis not present

## 2020-01-29 DIAGNOSIS — M6281 Muscle weakness (generalized): Secondary | ICD-10-CM | POA: Diagnosis not present

## 2020-01-29 DIAGNOSIS — F4323 Adjustment disorder with mixed anxiety and depressed mood: Secondary | ICD-10-CM | POA: Diagnosis not present

## 2020-01-31 DIAGNOSIS — M6281 Muscle weakness (generalized): Secondary | ICD-10-CM | POA: Diagnosis not present

## 2020-01-31 DIAGNOSIS — I872 Venous insufficiency (chronic) (peripheral): Secondary | ICD-10-CM | POA: Diagnosis not present

## 2020-02-03 DIAGNOSIS — M6281 Muscle weakness (generalized): Secondary | ICD-10-CM | POA: Diagnosis not present

## 2020-02-03 DIAGNOSIS — I872 Venous insufficiency (chronic) (peripheral): Secondary | ICD-10-CM | POA: Diagnosis not present

## 2020-02-04 DIAGNOSIS — M6281 Muscle weakness (generalized): Secondary | ICD-10-CM | POA: Diagnosis not present

## 2020-02-04 DIAGNOSIS — I872 Venous insufficiency (chronic) (peripheral): Secondary | ICD-10-CM | POA: Diagnosis not present

## 2020-02-05 DIAGNOSIS — I872 Venous insufficiency (chronic) (peripheral): Secondary | ICD-10-CM | POA: Diagnosis not present

## 2020-02-05 DIAGNOSIS — F4320 Adjustment disorder, unspecified: Secondary | ICD-10-CM | POA: Diagnosis not present

## 2020-02-05 DIAGNOSIS — M6281 Muscle weakness (generalized): Secondary | ICD-10-CM | POA: Diagnosis not present

## 2020-02-05 DIAGNOSIS — F4323 Adjustment disorder with mixed anxiety and depressed mood: Secondary | ICD-10-CM | POA: Diagnosis not present

## 2020-02-05 DIAGNOSIS — G3184 Mild cognitive impairment, so stated: Secondary | ICD-10-CM | POA: Diagnosis not present

## 2020-02-06 DIAGNOSIS — M6281 Muscle weakness (generalized): Secondary | ICD-10-CM | POA: Diagnosis not present

## 2020-02-06 DIAGNOSIS — I872 Venous insufficiency (chronic) (peripheral): Secondary | ICD-10-CM | POA: Diagnosis not present

## 2020-02-07 DIAGNOSIS — I872 Venous insufficiency (chronic) (peripheral): Secondary | ICD-10-CM | POA: Diagnosis not present

## 2020-02-07 DIAGNOSIS — M6281 Muscle weakness (generalized): Secondary | ICD-10-CM | POA: Diagnosis not present

## 2020-02-10 DIAGNOSIS — I872 Venous insufficiency (chronic) (peripheral): Secondary | ICD-10-CM | POA: Diagnosis not present

## 2020-02-10 DIAGNOSIS — F4320 Adjustment disorder, unspecified: Secondary | ICD-10-CM | POA: Diagnosis not present

## 2020-02-10 DIAGNOSIS — G3184 Mild cognitive impairment, so stated: Secondary | ICD-10-CM | POA: Diagnosis not present

## 2020-02-10 DIAGNOSIS — M6281 Muscle weakness (generalized): Secondary | ICD-10-CM | POA: Diagnosis not present

## 2020-02-11 DIAGNOSIS — I872 Venous insufficiency (chronic) (peripheral): Secondary | ICD-10-CM | POA: Diagnosis not present

## 2020-02-11 DIAGNOSIS — M6281 Muscle weakness (generalized): Secondary | ICD-10-CM | POA: Diagnosis not present

## 2020-02-12 DIAGNOSIS — M6281 Muscle weakness (generalized): Secondary | ICD-10-CM | POA: Diagnosis not present

## 2020-02-12 DIAGNOSIS — I872 Venous insufficiency (chronic) (peripheral): Secondary | ICD-10-CM | POA: Diagnosis not present

## 2020-02-13 DIAGNOSIS — I872 Venous insufficiency (chronic) (peripheral): Secondary | ICD-10-CM | POA: Diagnosis not present

## 2020-02-13 DIAGNOSIS — M6281 Muscle weakness (generalized): Secondary | ICD-10-CM | POA: Diagnosis not present

## 2020-02-19 DIAGNOSIS — G3184 Mild cognitive impairment, so stated: Secondary | ICD-10-CM | POA: Diagnosis not present

## 2020-02-19 DIAGNOSIS — F4323 Adjustment disorder with mixed anxiety and depressed mood: Secondary | ICD-10-CM | POA: Diagnosis not present

## 2020-02-26 DIAGNOSIS — F4323 Adjustment disorder with mixed anxiety and depressed mood: Secondary | ICD-10-CM | POA: Diagnosis not present

## 2020-02-26 DIAGNOSIS — G3184 Mild cognitive impairment, so stated: Secondary | ICD-10-CM | POA: Diagnosis not present

## 2020-03-02 ENCOUNTER — Non-Acute Institutional Stay (SKILLED_NURSING_FACILITY): Payer: Medicare Other | Admitting: Adult Health

## 2020-03-02 ENCOUNTER — Encounter: Payer: Self-pay | Admitting: Adult Health

## 2020-03-02 DIAGNOSIS — E1159 Type 2 diabetes mellitus with other circulatory complications: Secondary | ICD-10-CM

## 2020-03-02 DIAGNOSIS — I10 Essential (primary) hypertension: Secondary | ICD-10-CM | POA: Diagnosis not present

## 2020-03-02 DIAGNOSIS — J45909 Unspecified asthma, uncomplicated: Secondary | ICD-10-CM | POA: Diagnosis not present

## 2020-03-02 DIAGNOSIS — E119 Type 2 diabetes mellitus without complications: Secondary | ICD-10-CM | POA: Diagnosis not present

## 2020-03-02 DIAGNOSIS — R600 Localized edema: Secondary | ICD-10-CM | POA: Diagnosis not present

## 2020-03-02 DIAGNOSIS — K219 Gastro-esophageal reflux disease without esophagitis: Secondary | ICD-10-CM

## 2020-03-02 NOTE — Progress Notes (Signed)
Location:  [place] Room Number: 203-A Place of Service:  SNF (31) Provider:  Durenda Age, DNP, FNP-BC  Patient Care Team: Hendricks Limes, MD as PCP - General (Internal Medicine) Medina-Vargas, Senaida Lange, NP as Nurse Practitioner (Internal Medicine)  Extended Emergency Contact Information Primary Emergency Contact: Mintie, [place] Home Phone: [phone] Relation: Sister Secondary Emergency Contact: Sandria Manly Mobile Phone: [phone] Relation: Niece  Code Status:  FULL CODE  Goals of care: Advanced Directive information Advanced Directives 09/18/2019  Does Patient Have a Medical Advance Directive? Yes  Type of Advance Directive (No Data)  Does patient want to make changes to medical advance directive? No - Patient declined  Would patient like information on creating a medical advance directive? -  Pre-existing out of facility DNR order (yellow form or pink MOST form) -     Chief Complaint  Patient presents with  . Medical Management of Chronic Issues    Routine [facility] visit    HPI:  Pt is a 69 y.o. female for medical management of chronic diseases.  She is a long-term resident of [facility].  He has a PMH of diabetes mellitus type 2, PVD essential hypertension, GERD, NASH and history of pituitary microadenoma. She was seen in her room today. She was sleeping but woke up to verbal greetings. Currently participates in restorative 1)AROM BUE by doing armbike exercises for up to 5-10 minutes, and 2) walking by ambulating up to 15 ft X 3 trials using rolling walker, gait belt and wheelchair. She had a fall on 01/26/20 and no injury was sustained. No reported SOB/wheezing. She takes Advair and PRN Albuterol for asthma. SBPs ranging from 118 to 154. She takes Losartan and Hydralazine for hypertension.   Past Medical History:  Diagnosis Date  . Allergic rhinitis   . Arthritis    "ankles, feet, knees"  (10/30/2017)  . Asthma   . Bursitis   . Carpal tunnel syndrome   . GERD (gastroesophageal reflux disease)   . HTN (hypertension)   . Migraine    "a couple/yr" (10/30/2017)  . Obesity   . Peripheral vascular disease ([place])   . Pneumonia 1960s X 1  . Sciatica   . Tendinitis   . Type II diabetes mellitus (HCC)    Type II  . UTI (urinary tract infection)    Past Surgical History:  Procedure Laterality Date  . CRANIOTOMY N/A 01/19/2018   Procedure: ENDOSCOPIC TRANSPHENOIDAL RESECTION OF PITUITARY TUMOR;  Surgeon: Consuella Lose, MD;  Location: [place];  Service: Neurosurgery;  Laterality: N/A;  ENDOSCOPIC TRANSPHENOIDAL RESECTION OF PITUITARY TUMOR  . GANGLION CYST EXCISION Left   . KNEE ARTHROSCOPY Left 2003  . PLANTAR FASCIA RELEASE Right 1995   "heel"  . SINUS ENDO WITH FUSION N/A 01/19/2018   Procedure: SINUS ENDO WITH FUSION;  Surgeon: Jerrell Belfast, MD;  Location: [place];  Service: ENT;  Laterality: N/A;  . TONSILLECTOMY  1958  . TRANSPHENOIDAL APPROACH EXPOSURE N/A 01/19/2018   Procedure: TRANSPHENOIDAL  RESECTION OF PITUITARY TUMOR;  Surgeon: Jerrell Belfast, MD;  Location: [place];  Service: ENT;  Laterality: N/A;  . [place]; 1997   Dr [name]    Allergies  Allergen Reactions  . Penicillins Hives and Other (See Comments)    ++ got keflex in 2013 and 2019++ Has patient had a PCN reaction causing immediate rash, facial/tongue/throat swelling, SOB or lightheadedness with hypotension: No Has patient had a PCN reaction causing severe rash  involving mucus membranes or skin necrosis: No PATIENT HAS HAD A PCN REACTION THAT REQUIRED HOSPITALIZATION:  #  #  YES  #  #  Has patient had a PCN reaction occurring within the last 10 years: No   . Naproxen     History of GI intolerance with indomethacin.  She is on PPI for GERD.  NSAIDs should not be used long-term or as maintenance medication due to the comorbidities and age as per Beers' List  . Codeine Nausea And  Vomiting  . Indomethacin Diarrhea    Outpatient Encounter Medications as of 03/02/2020  Medication Sig  . acetaminophen (TYLENOL) 650 MG CR tablet Take 650 mg by mouth every 6 (six) hours as needed for pain.  [name] ADVAIR DISKUS 100-50 MCG/DOSE AEPB Inhale 1 puff into the lungs 2 (two) times daily.  [name] albuterol (PROVENTIL HFA;VENTOLIN HFA) 108 (90 Base) MCG/ACT inhaler Inhale 2 puffs into the lungs every 6 (six) hours as needed for wheezing or shortness of breath.  . bisacodyl (DULCOLAX) 10 MG suppository Constipation (2 of 4): If not relieved by MOM, give 10 mg Bisacodyl suppositiory rectally X 1 dose in 24 hours as needed (Do not use constipation standing orders for residents with renal failure/CFR less than 30. Contact MD for orders)  . cetaphil (CETAPHIL) cream Apply 1 application topically at bedtime. Apply to bilateral lower legs for dry skin.  . Cholecalciferol (VITAMIN D3) 125 MCG (5000 UT) CAPS Take 1 capsule by mouth daily.   [name] esomeprazole (NEXIUM) 20 MG capsule Take 1 capsule (20 mg total) by mouth every morning. For acid reflex  . furosemide (LASIX) 40 MG tablet TAKE ONE TABLET BY MOUTH EVERY DAY  . hydrALAZINE (APRESOLINE) 25 MG tablet Take 25 mg by mouth in the morning and at bedtime.  . levETIRAcetam (KEPPRA) 500 MG tablet Take 1 tablet (500 mg total) by mouth 2 (two) times daily.  [name] losartan (COZAAR) 100 MG tablet Take 100 mg by mouth daily.  . magnesium hydroxide (MILK OF MAGNESIA) 400 MG/5ML suspension Constipation (1 of 4): If no BM in 3 days, give 30 cc Milk of Magnesium p.o. x 1 dose in 24 hours as needed (Do not use standing constipation orders for residents with renal failure CFR less than 30. Contact MD for orders)  . Menthol, Topical Analgesic, (BIOFREEZE) 4 % GEL Apply 1 application topically 3 (three) times daily. Apply to bilateral knees for osteoarthritis  . metFORMIN (GLUCOPHAGE) 500 MG tablet Take 500 mg by mouth 2 (two) times daily with a meal.  . metoprolol succinate  (TOPROL-XL) 25 MG 24 hr tablet Take 50 mg by mouth 2 (two) times daily.   . mometasone (NASONEX) 50 MCG/ACT nasal spray Place 1 spray into the nose as needed.  . promethazine (PHENERGAN) 25 MG tablet Take 25 mg by mouth every 6 (six) hours as needed for nausea or vomiting. x3 doses  . protective barrier (RESTORE) CREA Apply topically. Apply barrier cream to buttocks topically BID PRN  . Sodium Fluoride (DENTA 5000 PLUS DT) Place 1 application onto teeth every evening.   No facility-administered encounter medications on file as of 03/02/2020.    Review of Systems  GENERAL: No change in appetite, no fatigue, no weight changes, no fever, chills or weakness MOUTH and THROAT: Denies oral discomfort, gingival pain or bleeding RESPIRATORY: no cough, SOB, DOE, wheezing, hemoptysis CARDIAC: No chest pain, edema or palpitations GI: No abdominal pain, diarrhea, constipation, heart burn, nausea or vomiting GU: Denies dysuria, frequency,  hematuria, incontinence, or discharge NEUROLOGICAL: Denies dizziness, syncope, numbness, or headache PSYCHIATRIC: Denies feelings of depression or anxiety. No report of hallucinations, insomnia, paranoia, or agitation   Immunization History  Administered Date(s) Administered  . Influenza Split 03/08/2013  . Influenza Whole 09/03/2009, 06/03/2010  . Influenza,inj,Quad PF,6+ Mos 06/25/2014  . Influenza-Unspecified 06/08/2017, 03/25/2018, 05/06/2019  . Moderna SARS-COVID-2 Vaccination 08/12/2019, 09/09/2019  . Pneumococcal Polysaccharide-23 07/25/2002, 10/31/2017  . Pneumococcal-Unspecified 05/07/2019  . Td 07/25/2005  . Tdap 05/03/2019   Pertinent  Health Maintenance Due  Topic Date Due  . COLONOSCOPY  Never done  . MAMMOGRAM  01/14/2017  . DEXA SCAN  Never done  . OPHTHALMOLOGY EXAM  09/08/2019  . HEMOGLOBIN A1C  12/08/2019  . INFLUENZA VACCINE  02/23/2020  . PNA vac Low Risk Adult (2 of 2 - PCV13) 05/06/2020  . FOOT EXAM  05/23/2020   Fall Risk   02/21/2019 03/06/2018 10/20/2017 09/01/2017 08/15/2017  Falls in the past year? (No Data) Yes Yes Yes Yes  Comment Emmi Telephone Survey: data to providers prior to load - - - -  Number falls in past yr: (No Data) 1 2 or more 2 or more 1  Comment Emmi Telephone Survey Actual Response =  - - 7 times  -  Injury with Fall? - Yes Yes No No  Comment - - Broken jaw - -  Risk Factor Category  - High Fall Risk High Fall Risk High Fall Risk High Fall Risk  Risk for fall due to : - Impaired balance/gait History of fall(s);Impaired mobility;Impaired balance/gait Impaired balance/gait;Impaired mobility Impaired balance/gait;Impaired mobility  Follow up - Falls prevention discussed - Falls prevention discussed Education provided     Vitals:   03/02/20 1200  BP: 128/64  Pulse: 75  Resp: 20  Temp: (!) 97.3 F (36.3 C)  TempSrc: Oral  Weight: 194 lb 9.6 oz (88.3 kg)  Height: 5\' 1"  (1.549 m)   Body mass index is 36.77 kg/m.  Physical Exam  GENERAL APPEARANCE: Well nourished. In no acute distress. Obese SKIN:  Skin is warm and dry.  MOUTH and THROAT: Lips are without lesions. Oral mucosa is moist and without lesions. Tongue is normal in shape, size, and color and without lesions RESPIRATORY: Breathing is even & unlabored, BS CTAB CARDIAC: RRR, no murmur,no extra heart sounds, BLE 1-2+ edema GI: Abdomen soft, normal BS, no masses, no tenderness EXTREMITIES:  Able to move X 4 extremities NEUROLOGICAL: There is no tremor. Speech is clear PSYCHIATRIC:  Affect and behavior are appropriate  Labs reviewed: Recent Labs    09/10/19 0000 01/01/20 0000  NA 139 139  K 4.0 3.6  CL 103 101  CO2 25* 25*  BUN 13 13  CREATININE 1.5* 1.2*  CALCIUM 9.3 9.6   Recent Labs    09/10/19 0000  AST 13  ALT 10  ALKPHOS 104  ALBUMIN 3.6   Recent Labs    09/10/19 0000 01/01/20 0000  WBC 10.9 9.3  NEUTROABS 8 6  HGB 10.9* 10.1*  HCT 33* 31*  PLT 428* 389   Lab Results  Component Value Date   TSH  2.34 01/01/2020   Lab Results  Component Value Date   HGBA1C 6.1 09/10/2019   Lab Results  Component Value Date   CHOL 178 08/30/2012   HDL 49 08/30/2012   LDLCALC 92 08/30/2012   TRIG 187 (H) 08/30/2012   CHOLHDL 3.6 08/30/2012     Assessment/Plan  1. Mild asthma without complication, unspecified whether persistent -  no SOB/wheezing, continue PRN albuterol and Advair  2. Hypertension associated with diabetes ([place]) -Stable, continue losartan  3. Lower extremity edema -Continue furosemide -Encouraged elevation of lower extremities  4. Type 2 diabetes mellitus treated without insulin (HCC) Lab Results  Component Value Date   HGBA1C 6.1 09/10/2019   -Continue Metformin and CBG checks  5. Gastroesophageal reflux disease without esophagitis -Stable, continue esomeprazole    Family/ staff Communication: Discussed plan of care with resident and charge nurse.  Labs/tests ordered: None  Goals of care:   Long-term care   Durenda Age, DNP, MSN, FNP-BC [facility] [phone] (Monday-Friday 8:00 a.m. - 5:00 p.m.) [phone] (after hours)

## 2020-03-03 ENCOUNTER — Encounter: Payer: Self-pay | Admitting: Adult Health

## 2020-03-04 DIAGNOSIS — F4323 Adjustment disorder with mixed anxiety and depressed mood: Secondary | ICD-10-CM | POA: Diagnosis not present

## 2020-03-04 DIAGNOSIS — G3184 Mild cognitive impairment, so stated: Secondary | ICD-10-CM | POA: Diagnosis not present

## 2020-03-11 ENCOUNTER — Encounter: Payer: Self-pay | Admitting: Adult Health

## 2020-03-11 DIAGNOSIS — F4323 Adjustment disorder with mixed anxiety and depressed mood: Secondary | ICD-10-CM | POA: Diagnosis not present

## 2020-03-11 DIAGNOSIS — G3184 Mild cognitive impairment, so stated: Secondary | ICD-10-CM | POA: Diagnosis not present

## 2020-03-11 NOTE — Progress Notes (Signed)
This encounter was created in error - please disregard.

## 2020-03-13 ENCOUNTER — Non-Acute Institutional Stay (SKILLED_NURSING_FACILITY): Payer: Medicare Other | Admitting: Adult Health

## 2020-03-13 ENCOUNTER — Encounter: Payer: Self-pay | Admitting: Adult Health

## 2020-03-13 DIAGNOSIS — I10 Essential (primary) hypertension: Secondary | ICD-10-CM | POA: Diagnosis not present

## 2020-03-13 DIAGNOSIS — Z7189 Other specified counseling: Secondary | ICD-10-CM

## 2020-03-13 DIAGNOSIS — G40909 Epilepsy, unspecified, not intractable, without status epilepticus: Secondary | ICD-10-CM | POA: Diagnosis not present

## 2020-03-13 DIAGNOSIS — E119 Type 2 diabetes mellitus without complications: Secondary | ICD-10-CM

## 2020-03-13 DIAGNOSIS — J45909 Unspecified asthma, uncomplicated: Secondary | ICD-10-CM

## 2020-03-13 DIAGNOSIS — E1159 Type 2 diabetes mellitus with other circulatory complications: Secondary | ICD-10-CM | POA: Diagnosis not present

## 2020-03-13 DIAGNOSIS — R600 Localized edema: Secondary | ICD-10-CM

## 2020-03-13 DIAGNOSIS — K219 Gastro-esophageal reflux disease without esophagitis: Secondary | ICD-10-CM | POA: Diagnosis not present

## 2020-03-13 DIAGNOSIS — I152 Hypertension secondary to endocrine disorders: Secondary | ICD-10-CM

## 2020-03-13 NOTE — Progress Notes (Signed)
Location:  [place] Room Number: 203-A Place of Service:  SNF (31) Provider:  Durenda Age, DNP, FNP-BC  Patient Care Team: Hendricks Limes, MD as PCP - General (Internal Medicine) Medina-Vargas, Senaida Lange, NP as Nurse Practitioner (Internal Medicine)  Extended Emergency Contact Information Primary Emergency Contact: Syncere, [place] Home Phone: [phone] Relation: Sister Secondary Emergency Contact: Sandria Manly Mobile Phone: [phone] Relation: Niece  Code Status:  FULL CODE  Goals of care: Advanced Directive information Advanced Directives 09/18/2019  Does Patient Have a Medical Advance Directive? Yes  Type of Advance Directive (No Data)  Does patient want to make changes to medical advance directive? No - Patient declined  Would patient like information on creating a medical advance directive? -  Pre-existing out of facility DNR order (yellow form or pink MOST form) -     Chief Complaint  Patient presents with  . Advanced Directive    Patient is seen for a care plan meeting    HPI:  Pt is a 68 y.o. female who had a care plan meeting today attended by [place] (niece), Vicente Males (husband of niece), and Education officer, museum.  Philis Nettle and Derek attended via telephone conference. She is a long-term care resident of [facility]. She has a PMH of diabetes mellitus type 2, essential hypertension, GERD, NASH and history of pituitary microadenoma. Resident remains to be full code. Discussed medications, vital signs and weights. Tosha requested for resident's mail to go to [place]. Philis Nettle said that she cannot request for it to go to [place] since she does not have POA papers. Resident cannot live by herself since she is needing 1 person assistance with her ADLs. Answered medical questions by family. The meeting lasted for 25 minutes.   Past Medical History:  Diagnosis Date  . Allergic rhinitis   . Arthritis    "ankles,  feet, knees" (10/30/2017)  . Asthma   . Bursitis   . Carpal tunnel syndrome   . GERD (gastroesophageal reflux disease)   . HTN (hypertension)   . Migraine    "a couple/yr" (10/30/2017)  . Obesity   . Peripheral vascular disease ([place])   . Pneumonia 1960s X 1  . Sciatica   . Tendinitis   . Type II diabetes mellitus (HCC)    Type II  . UTI (urinary tract infection)    Past Surgical History:  Procedure Laterality Date  . CRANIOTOMY N/A 01/19/2018   Procedure: ENDOSCOPIC TRANSPHENOIDAL RESECTION OF PITUITARY TUMOR;  Surgeon: Consuella Lose, MD;  Location: [place];  Service: Neurosurgery;  Laterality: N/A;  ENDOSCOPIC TRANSPHENOIDAL RESECTION OF PITUITARY TUMOR  . GANGLION CYST EXCISION Left   . KNEE ARTHROSCOPY Left 2003  . PLANTAR FASCIA RELEASE Right 1995   "heel"  . SINUS ENDO WITH FUSION N/A 01/19/2018   Procedure: SINUS ENDO WITH FUSION;  Surgeon: Jerrell Belfast, MD;  Location: [place];  Service: ENT;  Laterality: N/A;  . TONSILLECTOMY  1958  . TRANSPHENOIDAL APPROACH EXPOSURE N/A 01/19/2018   Procedure: TRANSPHENOIDAL  RESECTION OF PITUITARY TUMOR;  Surgeon: Jerrell Belfast, MD;  Location: [place];  Service: ENT;  Laterality: N/A;  . [place]; 1997   Dr [name]    Allergies  Allergen Reactions  . Penicillins Hives and Other (See Comments)    ++ got keflex in 2013 and 2019++ Has patient had a PCN reaction causing immediate rash, facial/tongue/throat swelling, SOB or lightheadedness with hypotension: No Has patient had a PCN reaction causing severe rash  involving mucus membranes or skin necrosis: No PATIENT HAS HAD A PCN REACTION THAT REQUIRED HOSPITALIZATION:  #  #  YES  #  #  Has patient had a PCN reaction occurring within the last 10 years: No   . Naproxen     History of GI intolerance with indomethacin.  She is on PPI for GERD.  NSAIDs should not be used long-term or as maintenance medication due to the comorbidities and age as per Beers' List  . Codeine  Nausea And Vomiting  . Indomethacin Diarrhea    Outpatient Encounter Medications as of 03/13/2020  Medication Sig  . acetaminophen (TYLENOL) 650 MG CR tablet Take 650 mg by mouth every 6 (six) hours as needed for pain.  [name] ADVAIR DISKUS 100-50 MCG/DOSE AEPB Inhale 1 puff into the lungs 2 (two) times daily.  [name] albuterol (PROVENTIL HFA;VENTOLIN HFA) 108 (90 Base) MCG/ACT inhaler Inhale 2 puffs into the lungs every 6 (six) hours as needed for wheezing or shortness of breath.  . bisacodyl (DULCOLAX) 10 MG suppository Constipation (2 of 4): If not relieved by MOM, give 10 mg Bisacodyl suppositiory rectally X 1 dose in 24 hours as needed (Do not use constipation standing orders for residents with renal failure/CFR less than 30. Contact MD for orders)  . cetaphil (CETAPHIL) cream Apply 1 application topically at bedtime. Apply to bilateral lower legs for dry skin.  . Cholecalciferol (VITAMIN D3) 125 MCG (5000 UT) CAPS Take 1 capsule by mouth daily.   [name] esomeprazole (NEXIUM) 20 MG capsule Take 1 capsule (20 mg total) by mouth every morning. For acid reflex  . furosemide (LASIX) 40 MG tablet TAKE ONE TABLET BY MOUTH EVERY DAY  . hydrALAZINE (APRESOLINE) 25 MG tablet Take 25 mg by mouth in the morning and at bedtime.  . levETIRAcetam (KEPPRA) 500 MG tablet Take 1 tablet (500 mg total) by mouth 2 (two) times daily.  [name] losartan (COZAAR) 100 MG tablet Take 100 mg by mouth daily.  . magnesium hydroxide (MILK OF MAGNESIA) 400 MG/5ML suspension Constipation (1 of 4): If no BM in 3 days, give 30 cc Milk of Magnesium p.o. x 1 dose in 24 hours as needed (Do not use standing constipation orders for residents with renal failure CFR less than 30. Contact MD for orders)  . Menthol, Topical Analgesic, (BIOFREEZE) 4 % GEL Apply 1 application topically 3 (three) times daily. Apply to bilateral knees for osteoarthritis  . metFORMIN (GLUCOPHAGE) 500 MG tablet Take 500 mg by mouth 2 (two) times daily with a meal.  . metoprolol  succinate (TOPROL-XL) 25 MG 24 hr tablet Take 50 mg by mouth 2 (two) times daily.   . mometasone (NASONEX) 50 MCG/ACT nasal spray Place 1 spray into the nose as needed.  . promethazine (PHENERGAN) 25 MG tablet Take 25 mg by mouth every 6 (six) hours as needed for nausea or vomiting. x3 doses  . protective barrier (RESTORE) CREA Apply topically. Apply barrier cream to buttocks topically BID PRN  . Sodium Fluoride (DENTA 5000 PLUS DT) Place 1 application onto teeth every evening.   No facility-administered encounter medications on file as of 03/13/2020.    Review of Systems  GENERAL: No change in appetite, no fatigue, no weight changes, no fever, chills or weakness MOUTH and THROAT: Denies oral discomfort, gingival pain or bleeding RESPIRATORY: no cough, SOB, DOE, wheezing, hemoptysis CARDIAC: No chest pain, edema or palpitations GI: No abdominal pain, diarrhea, constipation, heart burn, nausea or vomiting GU: Denies dysuria, frequency,  hematuria, incontinence, or discharge NEUROLOGICAL: Denies dizziness, syncope, numbness, or headache PSYCHIATRIC: Denies feelings of depression or anxiety. No report of hallucinations, insomnia, paranoia, or agitation   Immunization History  Administered Date(s) Administered  . Influenza Split 03/08/2013  . Influenza Whole 09/03/2009, 06/03/2010  . Influenza,inj,Quad PF,6+ Mos 06/25/2014  . Influenza-Unspecified 06/08/2017, 03/25/2018, 05/06/2019  . Moderna SARS-COVID-2 Vaccination 08/12/2019, 09/09/2019  . Pneumococcal Polysaccharide-23 07/25/2002, 10/31/2017  . Pneumococcal-Unspecified 05/07/2019  . Td 07/25/2005  . Tdap 05/03/2019   Pertinent  Health Maintenance Due  Topic Date Due  . COLONOSCOPY  Never done  . OPHTHALMOLOGY EXAM  09/08/2019  . HEMOGLOBIN A1C  12/08/2019  . INFLUENZA VACCINE  02/23/2020  . MAMMOGRAM  03/03/2021 (Originally 01/14/2017)  . DEXA SCAN  03/03/2021 (Originally 09/02/2017)  . PNA vac Low Risk Adult (2 of 2 - PCV13)  05/06/2020  . FOOT EXAM  05/23/2020   Fall Risk  02/21/2019 03/06/2018 10/20/2017 09/01/2017 08/15/2017  Falls in the past year? (No Data) Yes Yes Yes Yes  Comment Emmi Telephone Survey: data to providers prior to load - - - -  Number falls in past yr: (No Data) 1 2 or more 2 or more 1  Comment Emmi Telephone Survey Actual Response =  - - 7 times  -  Injury with Fall? - Yes Yes No No  Comment - - Broken jaw - -  Risk Factor Category  - High Fall Risk High Fall Risk High Fall Risk High Fall Risk  Risk for fall due to : - Impaired balance/gait History of fall(s);Impaired mobility;Impaired balance/gait Impaired balance/gait;Impaired mobility Impaired balance/gait;Impaired mobility  Follow up - Falls prevention discussed - Falls prevention discussed Education provided     Vitals:   03/13/20 1031  BP: (!) 158/87  Pulse: 78  Resp: 19  Temp: 97.9 F (36.6 C)  TempSrc: Oral  Weight: 194 lb 9.6 oz (88.3 kg)  Height: 5\' 1"  (1.549 m)   Body mass index is 36.77 kg/m.  Physical Exam  GENERAL APPEARANCE: Well nourished. In no acute distress. Obese SKIN:  Skin is warm and dry.  MOUTH and THROAT: Lips are without lesions. Oral mucosa is moist and without lesions. Tongue is normal in shape, size, and color and without lesions RESPIRATORY: Breathing is even & unlabored, BS CTAB CARDIAC: RRR, no murmur,no extra heart sounds, BLE trace edema GI: Abdomen soft, normal BS, no masses, no tenderness EXTREMITIES:  Able to move X 4 extremities NEUROLOGICAL: There is no tremor. Speech is clear. Alert to self and place, disoriented to time. PSYCHIATRIC:  Affect and behavior are appropriate  Labs reviewed: Recent Labs    09/10/19 0000 01/01/20 0000  NA 139 139  K 4.0 3.6  CL 103 101  CO2 25* 25*  BUN 13 13  CREATININE 1.5* 1.2*  CALCIUM 9.3 9.6   Recent Labs    09/10/19 0000  AST 13  ALT 10  ALKPHOS 104  ALBUMIN 3.6   Recent Labs    09/10/19 0000 01/01/20 0000  WBC 10.9 9.3  NEUTROABS  8 6  HGB 10.9* 10.1*  HCT 33* 31*  PLT 428* 389   Lab Results  Component Value Date   TSH 2.34 01/01/2020   Lab Results  Component Value Date   HGBA1C 6.1 09/10/2019   Lab Results  Component Value Date   CHOL 178 08/30/2012   HDL 49 08/30/2012   LDLCALC 92 08/30/2012   TRIG 187 (H) 08/30/2012   CHOLHDL 3.6 08/30/2012    Assessment/Plan  1. ACP (advance care planning) -Remains to be full code -Discussed medications, vital signs and weights  2. Type 2 diabetes mellitus treated without insulin (HCC) Lab Results  Component Value Date   HGBA1C 6.1 09/10/2019   -Controlled, continue Metformin and CBG checks  3. Hypertension associated with diabetes ([place]) -Stable, continue metoprolol tartrate, losartan and hydralazine  4. Lower extremity edema -Stable, continue furosemide  5. Seizure disorder ([place]) -No reported seizures, continue levetiracetam  6. Mild asthma without complication, unspecified whether persistent -No wheezing/SOB, continue Advair, PRN mometasone and PRN albuterol  7. Gastroesophageal reflux disease without esophagitis -Stable, continue esomeprazole     Family/ staff Communication: Discussed plan of care with resident, niece and husband, and IDT.  Labs/tests ordered: None  Goals of care: Long-term care   Durenda Age, DNP, MSN, FNP-BC [facility] [phone] (Monday-Friday 8:00 a.m. - 5:00 p.m.) [phone] (after hours)

## 2020-03-16 DIAGNOSIS — G9341 Metabolic encephalopathy: Secondary | ICD-10-CM | POA: Diagnosis not present

## 2020-03-16 DIAGNOSIS — R41841 Cognitive communication deficit: Secondary | ICD-10-CM | POA: Diagnosis not present

## 2020-03-18 DIAGNOSIS — G3184 Mild cognitive impairment, so stated: Secondary | ICD-10-CM | POA: Diagnosis not present

## 2020-03-18 DIAGNOSIS — F4323 Adjustment disorder with mixed anxiety and depressed mood: Secondary | ICD-10-CM | POA: Diagnosis not present

## 2020-04-06 ENCOUNTER — Encounter: Payer: Self-pay | Admitting: Adult Health

## 2020-04-06 ENCOUNTER — Non-Acute Institutional Stay (SKILLED_NURSING_FACILITY): Payer: Medicare Other | Admitting: Adult Health

## 2020-04-06 DIAGNOSIS — K219 Gastro-esophageal reflux disease without esophagitis: Secondary | ICD-10-CM

## 2020-04-06 DIAGNOSIS — E1159 Type 2 diabetes mellitus with other circulatory complications: Secondary | ICD-10-CM

## 2020-04-06 DIAGNOSIS — E1169 Type 2 diabetes mellitus with other specified complication: Secondary | ICD-10-CM | POA: Diagnosis not present

## 2020-04-06 DIAGNOSIS — J45909 Unspecified asthma, uncomplicated: Secondary | ICD-10-CM

## 2020-04-06 DIAGNOSIS — I739 Peripheral vascular disease, unspecified: Secondary | ICD-10-CM

## 2020-04-06 DIAGNOSIS — G40909 Epilepsy, unspecified, not intractable, without status epilepticus: Secondary | ICD-10-CM | POA: Diagnosis not present

## 2020-04-06 DIAGNOSIS — I10 Essential (primary) hypertension: Secondary | ICD-10-CM

## 2020-04-06 NOTE — Progress Notes (Signed)
Location:  [place] Room Number: 203-A Place of Service:  SNF (31) Provider:  Durenda Age, DNP, FNP-BC  Patient Care Team: Hendricks Limes, MD as PCP - General (Internal Medicine) Medina-Vargas, Senaida Lange, NP as Nurse Practitioner (Internal Medicine)  Extended Emergency Contact Information Primary Emergency Contact: Joyanna, Kleman Home Phone: [phone] Relation: Sister Secondary Emergency Contact: Sandria Manly Mobile Phone: [phone] Relation: Niece  Code Status:  FULL CODE  Goals of care: Advanced Directive information Advanced Directives 09/18/2019  Does Patient Have a Medical Advance Directive? Yes  Type of Advance Directive (No Data)  Does patient want to make changes to medical advance directive? No - Patient declined  Would patient like information on creating a medical advance directive? -  Pre-existing out of facility DNR order (yellow form or pink MOST form) -     Chief Complaint  Patient presents with   Medical Management of Chronic Issues    Routine [facility] visit   Quality Metric Gaps    Needs A1c    HPI:  Pt is a 69 y.o. female seen today for medical management of chronic diseases.  She is a long-term care resident of [facility].  She has a PMH of diabetes mellitus type 2, PVD, essential hypertension, GERD, NASH and history of pituitary microadenoma. She currently participates in restorative programs such as, 1) walking, ambulating up to 15 ft X 3 using a walker, and 2) AROM BUE, completes armbike exercises up to 5-10 minutes. CBGs ranging from 101 to 166, with outlier 86. She takes Metformin 500 mg BID for diabetes mellitus. No recent seizures. She takes Keppra 500 mg BID for seizures.     Past Medical History:  Diagnosis Date   Allergic rhinitis    Arthritis    "ankles, feet, knees" (10/30/2017)   Asthma    Bursitis    Carpal tunnel syndrome    GERD (gastroesophageal  reflux disease)    HTN (hypertension)    Migraine    "a couple/yr" (10/30/2017)   Obesity    Peripheral vascular disease ([place])    Pneumonia 1960s X 1   Sciatica    Tendinitis    Type II diabetes mellitus ([place])    Type II   UTI (urinary tract infection)    Past Surgical History:  Procedure Laterality Date   CRANIOTOMY N/A 01/19/2018   Procedure: ENDOSCOPIC TRANSPHENOIDAL RESECTION OF PITUITARY TUMOR;  Surgeon: Consuella Lose, MD;  Location: [place];  Service: Neurosurgery;  Laterality: N/A;  ENDOSCOPIC TRANSPHENOIDAL RESECTION OF PITUITARY TUMOR   GANGLION CYST EXCISION Left    KNEE ARTHROSCOPY Left 2003   PLANTAR FASCIA RELEASE Right 1995   "heel"   SINUS ENDO WITH FUSION N/A 01/19/2018   Procedure: SINUS ENDO WITH FUSION;  Surgeon: Jerrell Belfast, MD;  Location: [place];  Service: ENT;  Laterality: N/A;   [place] N/A 01/19/2018   Procedure: TRANSPHENOIDAL  RESECTION OF PITUITARY TUMOR;  Surgeon: Jerrell Belfast, MD;  Location: [place];  Service: ENT;  Laterality: N/A;   [place]; 1997   Dr [name]    Allergies  Allergen Reactions   Penicillins Hives and Other (See Comments)    ++ got keflex in 2013 and 2019++ Has patient had a PCN reaction causing immediate rash, facial/tongue/throat swelling, SOB or lightheadedness with hypotension: No Has patient had a PCN reaction causing severe rash involving mucus membranes or skin necrosis: No PATIENT HAS HAD  A PCN REACTION THAT REQUIRED HOSPITALIZATION:  #  #  YES  #  #  Has patient had a PCN reaction occurring within the last 10 years: No    Naproxen     History of GI intolerance with indomethacin.  She is on PPI for GERD.  NSAIDs should not be used long-term or as maintenance medication due to the comorbidities and age as per Beers' List   Codeine Nausea And Vomiting   Indomethacin Diarrhea    Outpatient Encounter Medications as of 04/06/2020    Medication Sig   bisacodyl (DULCOLAX) 10 MG suppository Constipation (2 of 4): If not relieved by MOM, give 10 mg Bisacodyl suppositiory rectally X 1 dose in 24 hours as needed (Do not use constipation standing orders for residents with renal failure/CFR less than 30. Contact MD for orders)   Sodium Fluoride (DENTA 5000 PLUS DT) Place 1 application onto teeth every evening.   acetaminophen (TYLENOL) 650 MG CR tablet Take 650 mg by mouth every 6 (six) hours as needed for pain.   ADVAIR DISKUS 100-50 MCG/DOSE AEPB Inhale 1 puff into the lungs 2 (two) times daily.   albuterol (PROVENTIL HFA;VENTOLIN HFA) 108 (90 Base) MCG/ACT inhaler Inhale 2 puffs into the lungs every 6 (six) hours as needed for wheezing or shortness of breath.   cetaphil (CETAPHIL) cream Apply 1 application topically at bedtime. Apply to bilateral lower legs for dry skin.   Cholecalciferol (VITAMIN D3) 125 MCG (5000 UT) CAPS Take 1 capsule by mouth daily.    esomeprazole (NEXIUM) 20 MG capsule Take 1 capsule (20 mg total) by mouth every morning. For acid reflex   furosemide (LASIX) 40 MG tablet TAKE ONE TABLET BY MOUTH EVERY DAY   hydrALAZINE (APRESOLINE) 25 MG tablet Take 25 mg by mouth in the morning and at bedtime.   levETIRAcetam (KEPPRA) 500 MG tablet Take 1 tablet (500 mg total) by mouth 2 (two) times daily.   losartan (COZAAR) 100 MG tablet Take 100 mg by mouth daily.   magnesium hydroxide (MILK OF MAGNESIA) 400 MG/5ML suspension Constipation (1 of 4): If no BM in 3 days, give 30 cc Milk of Magnesium p.o. x 1 dose in 24 hours as needed (Do not use standing constipation orders for residents with renal failure CFR less than 30. Contact MD for orders)   Menthol, Topical Analgesic, (BIOFREEZE) 4 % GEL Apply 1 application topically 3 (three) times daily. Apply to bilateral knees for osteoarthritis   metFORMIN (GLUCOPHAGE) 500 MG tablet Take 500 mg by mouth 2 (two) times daily with a meal.   metoprolol succinate  (TOPROL-XL) 25 MG 24 hr tablet Take 50 mg by mouth 2 (two) times daily.    mometasone (NASONEX) 50 MCG/ACT nasal spray Place 1 spray into the nose as needed.   promethazine (PHENERGAN) 25 MG tablet Take 25 mg by mouth every 6 (six) hours as needed for nausea or vomiting. x3 doses   protective barrier (RESTORE) CREA Apply topically. Apply barrier cream to buttocks topically BID PRN   No facility-administered encounter medications on file as of 04/06/2020.    Review of Systems  GENERAL: No change in appetite, no fatigue, no weight changes, no fever, chills or weakness MOUTH and THROAT: Denies oral discomfort, gingival pain or bleeding RESPIRATORY: no cough, SOB, DOE, wheezing, hemoptysis CARDIAC: No chest pain or palpitations GI: No abdominal pain, diarrhea, constipation, heart burn, nausea or vomiting GU: Denies dysuria, frequency, hematuria, incontinence, or discharge NEUROLOGICAL: Denies dizziness, syncope, numbness, or  headache PSYCHIATRIC: Denies feelings of depression or anxiety. No report of hallucinations, insomnia, paranoia, or agitation   Immunization History  Administered Date(s) Administered   Influenza Split 03/08/2013   Influenza Whole 09/03/2009, 06/03/2010   Influenza,inj,Quad PF,6+ Mos 06/25/2014   Influenza-Unspecified 06/08/2017, 03/25/2018, 05/06/2019   Moderna SARS-COVID-2 Vaccination 08/12/2019, 09/09/2019   Pneumococcal Polysaccharide-23 07/25/2002, 10/31/2017   Pneumococcal-Unspecified 05/07/2019   Td 07/25/2005   Tdap 05/03/2019   Pertinent  Health Maintenance Due  Topic Date Due   COLONOSCOPY  Never done   OPHTHALMOLOGY EXAM  09/08/2019   HEMOGLOBIN A1C  12/08/2019   INFLUENZA VACCINE  02/23/2020   MAMMOGRAM  03/03/2021 (Originally 01/14/2017)   DEXA SCAN  03/03/2021 (Originally 09/02/2017)   PNA vac Low Risk Adult (2 of 2 - PCV13) 05/06/2020   FOOT EXAM  05/23/2020   Fall Risk  02/21/2019 03/06/2018 10/20/2017 09/01/2017 08/15/2017  Falls  in the past year? (No Data) Yes Yes Yes Yes  Comment Emmi Telephone Survey: data to providers prior to load - - - -  Number falls in past yr: (No Data) 1 2 or more 2 or more 1  Comment Emmi Telephone Survey Actual Response =  - - 7 times  -  Injury with Fall? - Yes Yes No No  Comment - - Broken jaw - -  Risk Factor Category  - High Fall Risk High Fall Risk High Fall Risk High Fall Risk  Risk for fall due to : - Impaired balance/gait History of fall(s);Impaired mobility;Impaired balance/gait Impaired balance/gait;Impaired mobility Impaired balance/gait;Impaired mobility  Follow up - Falls prevention discussed - Falls prevention discussed Education provided     Vitals:   04/06/20 1514  BP: 118/78  Pulse: 78  Resp: 16  Temp: (!) 97.5 F (36.4 C)  TempSrc: Oral  Weight: 197 lb (89.4 kg)  Height: 5\' 1"  (1.549 m)   Body mass index is 37.22 kg/m.  Physical Exam  GENERAL APPEARANCE: Well nourished. In no acute distress. Morbidly obese SKIN:  Skin is warm and dry.  MOUTH and THROAT: Lips are without lesions. Oral mucosa is moist and without lesions. Tongue is normal in shape, size, and color and without lesions RESPIRATORY: Breathing is even & unlabored, BS CTAB CARDIAC: RRR, no murmur,no extra heart sounds, BLE 1+ edema GI: Abdomen soft, normal BS, no masses, no tenderness EXTREMITIES:  Able to move X 4 extremities NEUROLOGICAL: There is no tremor. Speech is clear. Alert to self, disoriented to time and place. PSYCHIATRIC:  Affect and behavior are appropriate  Labs reviewed: Recent Labs    09/10/19 0000 01/01/20 0000  NA 139 139  K 4.0 3.6  CL 103 101  CO2 25* 25*  BUN 13 13  CREATININE 1.5* 1.2*  CALCIUM 9.3 9.6   Recent Labs    09/10/19 0000  AST 13  ALT 10  ALKPHOS 104  ALBUMIN 3.6   Recent Labs    09/10/19 0000 01/01/20 0000  WBC 10.9 9.3  NEUTROABS 8 6  HGB 10.9* 10.1*  HCT 33* 31*  PLT 428* 389   Lab Results  Component Value Date   TSH 2.34  01/01/2020   Lab Results  Component Value Date   HGBA1C 6.1 09/10/2019   Lab Results  Component Value Date   CHOL 178 08/30/2012   HDL 49 08/30/2012   LDLCALC 92 08/30/2012   TRIG 187 (H) 08/30/2012   CHOLHDL 3.6 08/30/2012    Assessment/Plan  1. Type 2 diabetes mellitus with morbid obesity ([place]) -  CBGs stable, continue Metformin -  Check hgbA1c -  Discussed appropriate food choices  2. PVD (peripheral vascular disease) (H[facility]) -Stable, continue furosemide -Encourage elevation of lower extremities  3. Hypertension associated with diabetes ([place]) -Stable, continue losartan, hydralazine and metoprolol tartrate -Monitor BPs  4. Seizure disorder ([facility]) -No recent seizures, continue Keppra -Seizure precautions  5. Mild asthma without complication, unspecified whether persistent -No wheezing/SOB, continue Advair and PRN Albuterol  6. Gastroesophageal reflux disease without esophagitis -Stable, continue esomeprazole     Family/ staff Communication:   Discussed plan of care with resident and charge nurse.  Labs/tests ordered:  HgbA1c  Goals of care:   Long-term care   Durenda Age, DNP, MSN, FNP-BC [facility] [phone] (Monday-Friday 8:00 a.m. - 5:00 p.m.) [phone] (after hours)

## 2020-04-07 DIAGNOSIS — E1151 Type 2 diabetes mellitus with diabetic peripheral angiopathy without gangrene: Secondary | ICD-10-CM | POA: Diagnosis not present

## 2020-04-07 DIAGNOSIS — I872 Venous insufficiency (chronic) (peripheral): Secondary | ICD-10-CM | POA: Diagnosis not present

## 2020-04-07 DIAGNOSIS — G9341 Metabolic encephalopathy: Secondary | ICD-10-CM | POA: Diagnosis not present

## 2020-04-07 LAB — HEMOGLOBIN A1C: Hemoglobin A1C: 6.2

## 2020-04-08 DIAGNOSIS — G3184 Mild cognitive impairment, so stated: Secondary | ICD-10-CM | POA: Diagnosis not present

## 2020-04-08 DIAGNOSIS — F4323 Adjustment disorder with mixed anxiety and depressed mood: Secondary | ICD-10-CM | POA: Diagnosis not present

## 2020-04-20 DIAGNOSIS — I872 Venous insufficiency (chronic) (peripheral): Secondary | ICD-10-CM | POA: Diagnosis not present

## 2020-04-20 DIAGNOSIS — M6281 Muscle weakness (generalized): Secondary | ICD-10-CM | POA: Diagnosis not present

## 2020-04-21 DIAGNOSIS — I872 Venous insufficiency (chronic) (peripheral): Secondary | ICD-10-CM | POA: Diagnosis not present

## 2020-04-21 DIAGNOSIS — M6281 Muscle weakness (generalized): Secondary | ICD-10-CM | POA: Diagnosis not present

## 2020-04-22 DIAGNOSIS — I872 Venous insufficiency (chronic) (peripheral): Secondary | ICD-10-CM | POA: Diagnosis not present

## 2020-04-22 DIAGNOSIS — M6281 Muscle weakness (generalized): Secondary | ICD-10-CM | POA: Diagnosis not present

## 2020-04-23 DIAGNOSIS — M6281 Muscle weakness (generalized): Secondary | ICD-10-CM | POA: Diagnosis not present

## 2020-04-23 DIAGNOSIS — I872 Venous insufficiency (chronic) (peripheral): Secondary | ICD-10-CM | POA: Diagnosis not present

## 2020-04-24 DIAGNOSIS — M6281 Muscle weakness (generalized): Secondary | ICD-10-CM | POA: Diagnosis not present

## 2020-04-24 DIAGNOSIS — I872 Venous insufficiency (chronic) (peripheral): Secondary | ICD-10-CM | POA: Diagnosis not present

## 2020-04-27 DIAGNOSIS — I872 Venous insufficiency (chronic) (peripheral): Secondary | ICD-10-CM | POA: Diagnosis not present

## 2020-04-27 DIAGNOSIS — M6281 Muscle weakness (generalized): Secondary | ICD-10-CM | POA: Diagnosis not present

## 2020-04-28 DIAGNOSIS — I872 Venous insufficiency (chronic) (peripheral): Secondary | ICD-10-CM | POA: Diagnosis not present

## 2020-04-28 DIAGNOSIS — M6281 Muscle weakness (generalized): Secondary | ICD-10-CM | POA: Diagnosis not present

## 2020-04-29 DIAGNOSIS — I872 Venous insufficiency (chronic) (peripheral): Secondary | ICD-10-CM | POA: Diagnosis not present

## 2020-04-29 DIAGNOSIS — M6281 Muscle weakness (generalized): Secondary | ICD-10-CM | POA: Diagnosis not present

## 2020-04-30 DIAGNOSIS — M6281 Muscle weakness (generalized): Secondary | ICD-10-CM | POA: Diagnosis not present

## 2020-04-30 DIAGNOSIS — I872 Venous insufficiency (chronic) (peripheral): Secondary | ICD-10-CM | POA: Diagnosis not present

## 2020-05-02 DIAGNOSIS — I872 Venous insufficiency (chronic) (peripheral): Secondary | ICD-10-CM | POA: Diagnosis not present

## 2020-05-02 DIAGNOSIS — M6281 Muscle weakness (generalized): Secondary | ICD-10-CM | POA: Diagnosis not present

## 2020-05-04 DIAGNOSIS — M6281 Muscle weakness (generalized): Secondary | ICD-10-CM | POA: Diagnosis not present

## 2020-05-04 DIAGNOSIS — I872 Venous insufficiency (chronic) (peripheral): Secondary | ICD-10-CM | POA: Diagnosis not present

## 2020-05-05 ENCOUNTER — Encounter: Payer: Self-pay | Admitting: Adult Health

## 2020-05-05 ENCOUNTER — Non-Acute Institutional Stay (SKILLED_NURSING_FACILITY): Payer: Medicare Other | Admitting: Adult Health

## 2020-05-05 DIAGNOSIS — I872 Venous insufficiency (chronic) (peripheral): Secondary | ICD-10-CM | POA: Diagnosis not present

## 2020-05-05 DIAGNOSIS — R4189 Other symptoms and signs involving cognitive functions and awareness: Secondary | ICD-10-CM

## 2020-05-05 DIAGNOSIS — E1159 Type 2 diabetes mellitus with other circulatory complications: Secondary | ICD-10-CM

## 2020-05-05 DIAGNOSIS — G40909 Epilepsy, unspecified, not intractable, without status epilepticus: Secondary | ICD-10-CM

## 2020-05-05 DIAGNOSIS — I152 Hypertension secondary to endocrine disorders: Secondary | ICD-10-CM

## 2020-05-05 DIAGNOSIS — E1169 Type 2 diabetes mellitus with other specified complication: Secondary | ICD-10-CM | POA: Diagnosis not present

## 2020-05-05 DIAGNOSIS — K219 Gastro-esophageal reflux disease without esophagitis: Secondary | ICD-10-CM | POA: Diagnosis not present

## 2020-05-05 DIAGNOSIS — M6281 Muscle weakness (generalized): Secondary | ICD-10-CM | POA: Diagnosis not present

## 2020-05-05 DIAGNOSIS — R29818 Other symptoms and signs involving the nervous system: Secondary | ICD-10-CM

## 2020-05-05 DIAGNOSIS — I739 Peripheral vascular disease, unspecified: Secondary | ICD-10-CM

## 2020-05-05 NOTE — Progress Notes (Signed)
Location:  [place] Room Number: 203-A Place of Service:  SNF (31) Provider:  Durenda Age, DNP, FNP-BC  Patient Care Team: Hendricks Limes, MD as PCP - General (Internal Medicine) Medina-Vargas, Senaida Lange, NP as Nurse Practitioner (Internal Medicine)  Extended Emergency Contact Information Primary Emergency Contact: Marcedes, Tech Home Phone: [phone] Relation: Sister Secondary Emergency Contact: Sandria Manly Mobile Phone: [phone] Relation: Niece  Code Status:  FULL CODE  Goals of care: Advanced Directive information Advanced Directives 09/18/2019  Does Patient Have a Medical Advance Directive? Yes  Type of Advance Directive (No Data)  Does patient want to make changes to medical advance directive? No - Patient declined  Would patient like information on creating a medical advance directive? -  Pre-existing out of facility DNR order (yellow form or pink MOST form) -     Chief Complaint  Patient presents with  . Medical Management of Chronic Issues    Routine [facility] visit    HPI:  Pt is a 69 y.o. female seen today for medical management of chronic diseases.  She is a long-term care resident of [facility]. She has a PMH of diabetes mellitus type 2, PVD, essential hypertension, NASH and history of pituitary microadenoma. She participates in restorative exercises such as 1) walking, and 2) AROM BUE. Recent BIMS score was 11/15, ranging in moderate cognitive impairment. CBGs ranging from 88 to 152. She currently takes Metformin 500 mg BID for diabetes mellitus.   Past Medical History:  Diagnosis Date  . Allergic rhinitis   . Arthritis    "ankles, feet, knees" (10/30/2017)  . Asthma   . Bursitis   . Carpal tunnel syndrome   . GERD (gastroesophageal reflux disease)   . HTN (hypertension)   . Migraine    "a couple/yr" (10/30/2017)  . Obesity   . Peripheral vascular disease ([place])   . Pneumonia  1960s X 1  . Sciatica   . Tendinitis   . Type II diabetes mellitus (H[facility])    Type II  . UTI (urinary tract infection)    Past Surgical History:  Procedure Laterality Date  . CRANIOTOMY N/A 01/19/2018   Procedure: ENDOSCOPIC TRANSPHENOIDAL RESECTION OF PITUITARY TUMOR;  Surgeon: Consuella Lose, MD;  Location: [place];  Service: Neurosurgery;  Laterality: N/A;  ENDOSCOPIC TRANSPHENOIDAL RESECTION OF PITUITARY TUMOR  . GANGLION CYST EXCISION Left   . KNEE ARTHROSCOPY Left 2003  . PLANTAR FASCIA RELEASE Right 1995   "heel"  . SINUS ENDO WITH FUSION N/A 01/19/2018   Procedure: SINUS ENDO WITH FUSION;  Surgeon: Jerrell Belfast, MD;  Location: [place];  Service: ENT;  Laterality: N/A;  . TONSILLECTOMY  1958  . TRANSPHENOIDAL APPROACH EXPOSURE N/A 01/19/2018   Procedure: TRANSPHENOIDAL  RESECTION OF PITUITARY TUMOR;  Surgeon: Jerrell Belfast, MD;  Location: [place];  Service: ENT;  Laterality: N/A;  . [place]; 1997   Dr [name]    Allergies  Allergen Reactions  . Penicillins Hives and Other (See Comments)    ++ got keflex in 2013 and 2019++ Has patient had a PCN reaction causing immediate rash, facial/tongue/throat swelling, SOB or lightheadedness with hypotension: No Has patient had a PCN reaction causing severe rash involving mucus membranes or skin necrosis: No PATIENT HAS HAD A PCN REACTION THAT REQUIRED HOSPITALIZATION:  #  #  YES  #  #  Has patient had a PCN reaction occurring within the last 10 years: No   . Naproxen  History of GI intolerance with indomethacin.  She is on PPI for GERD.  NSAIDs should not be used long-term or as maintenance medication due to the comorbidities and age as per Beers' List  . Codeine Nausea And Vomiting  . Indomethacin Diarrhea    Outpatient Encounter Medications as of 05/05/2020  Medication Sig  . acetaminophen (TYLENOL) 650 MG CR tablet Take 650 mg by mouth every 6 (six) hours as needed for pain.  [name] ADVAIR DISKUS 100-50  MCG/DOSE AEPB Inhale 1 puff into the lungs 2 (two) times daily.  [name] albuterol (PROVENTIL HFA;VENTOLIN HFA) 108 (90 Base) MCG/ACT inhaler Inhale 2 puffs into the lungs every 6 (six) hours as needed for wheezing or shortness of breath.  . bisacodyl (DULCOLAX) 10 MG suppository Constipation (2 of 4): If not relieved by MOM, give 10 mg Bisacodyl suppositiory rectally X 1 dose in 24 hours as needed (Do not use constipation standing orders for residents with renal failure/CFR less than 30. Contact MD for orders)  . cetaphil (CETAPHIL) cream Apply 1 application topically at bedtime. Apply to bilateral lower legs for dry skin.  . Cholecalciferol (VITAMIN D3) 125 MCG (5000 UT) CAPS Take 1 capsule by mouth daily.   [name] esomeprazole (NEXIUM) 20 MG capsule Take 1 capsule (20 mg total) by mouth every morning. For acid reflex  . furosemide (LASIX) 40 MG tablet TAKE ONE TABLET BY MOUTH EVERY DAY  . hydrALAZINE (APRESOLINE) 25 MG tablet Take 25 mg by mouth in the morning and at bedtime.  . levETIRAcetam (KEPPRA) 500 MG tablet Take 1 tablet (500 mg total) by mouth 2 (two) times daily.  [name] loperamide (IMODIUM A-D) 2 MG tablet Take 2 mg by mouth as needed for diarrhea or loose stools. Give after each stool prn, not to exceed 8 mg in 24 hours  . losartan (COZAAR) 100 MG tablet Take 100 mg by mouth daily.  . magnesium hydroxide (MILK OF MAGNESIA) 400 MG/5ML suspension Constipation (1 of 4): If no BM in 3 days, give 30 cc Milk of Magnesium p.o. x 1 dose in 24 hours as needed (Do not use standing constipation orders for residents with renal failure CFR less than 30. Contact MD for orders)  . Menthol, Topical Analgesic, (BIOFREEZE) 4 % GEL Apply 1 application topically 3 (three) times daily. Apply to bilateral knees for osteoarthritis  . metFORMIN (GLUCOPHAGE) 500 MG tablet Take 500 mg by mouth 2 (two) times daily with a meal.  . metoprolol succinate (TOPROL-XL) 25 MG 24 hr tablet Take 50 mg by mouth 2 (two) times daily.   .  mometasone (NASONEX) 50 MCG/ACT nasal spray Place 1 spray into the nose as needed.  . promethazine (PHENERGAN) 25 MG tablet Take 25 mg by mouth every 6 (six) hours as needed for nausea or vomiting. x3 doses  . protective barrier (RESTORE) CREA Apply topically. Apply barrier cream to buttocks topically BID PRN  . Sodium Fluoride (DENTA 5000 PLUS DT) Place 1 application onto teeth every evening.   No facility-administered encounter medications on file as of 05/05/2020.    Review of Systems  GENERAL: No change in appetite, no fatigue, no weight changes, no fever, chills or weakness MOUTH and THROAT: Denies oral discomfort, gingival pain or bleeding RESPIRATORY: no cough, SOB, DOE, wheezing, hemoptysis CARDIAC: No chest pain or palpitations GI: No abdominal pain, diarrhea, constipation, heart burn, nausea or vomiting GU: Denies dysuria, frequency, hematuria, incontinence, or discharge NEUROLOGICAL: Denies dizziness, syncope, numbness, or headache PSYCHIATRIC: Denies feelings of depression  or anxiety. No report of hallucinations, insomnia, paranoia, or agitation   Immunization History  Administered Date(s) Administered  . Influenza Split 03/08/2013  . Influenza Whole 09/03/2009, 06/03/2010  . Influenza,inj,Quad PF,6+ Mos 06/25/2014  . Influenza-Unspecified 06/08/2017, 03/25/2018, 05/06/2019  . Moderna SARS-COVID-2 Vaccination 08/12/2019, 09/09/2019  . Pneumococcal Polysaccharide-23 07/25/2002, 10/31/2017  . Pneumococcal-Unspecified 05/07/2019  . Td 07/25/2005  . Tdap 05/03/2019   Pertinent  Health Maintenance Due  Topic Date Due  . COLONOSCOPY  Never done  . HEMOGLOBIN A1C  12/08/2019  . OPHTHALMOLOGY EXAM  07/24/2020 (Originally 09/08/2019)  . INFLUENZA VACCINE  10/22/2020 (Originally 02/23/2020)  . MAMMOGRAM  03/03/2021 (Originally 01/14/2017)  . DEXA SCAN  03/03/2021 (Originally 09/02/2017)  . PNA vac Low Risk Adult (2 of 2 - PCV13) 05/06/2020  . FOOT EXAM  05/23/2020   Fall Risk   02/21/2019 03/06/2018 10/20/2017 09/01/2017 08/15/2017  Falls in the past year? (No Data) Yes Yes Yes Yes  Comment Emmi Telephone Survey: data to providers prior to load - - - -  Number falls in past yr: (No Data) 1 2 or more 2 or more 1  Comment Emmi Telephone Survey Actual Response =  - - 7 times  -  Injury with Fall? - Yes Yes No No  Comment - - Broken jaw - -  Risk Factor Category  - High Fall Risk High Fall Risk High Fall Risk High Fall Risk  Risk for fall due to : - Impaired balance/gait History of fall(s);Impaired mobility;Impaired balance/gait Impaired balance/gait;Impaired mobility Impaired balance/gait;Impaired mobility  Follow up - Falls prevention discussed - Falls prevention discussed Education provided     Vitals:   05/05/20 1156  BP: (!) 102/56  Pulse: 63  Resp: 18  Temp: (!) 97.2 F (36.2 C)  TempSrc: Oral  Weight: 198 lb 6.4 oz (90 kg)  Height: 5\' 1"  (1.549 m)   Body mass index is 37.49 kg/m.  Physical Exam  GENERAL APPEARANCE: Well nourished. In no acute distress. Morbidly obese SKIN:  Skin is warm and dry.  MOUTH and THROAT: Lips are without lesions. Oral mucosa is moist and without lesions. Tongue is normal in shape, size, and color and without lesions RESPIRATORY: Breathing is even & unlabored, BS CTAB CARDIAC: RRR, no murmur,no extra heart sounds, BLE 1-2+ edema GI: Abdomen soft, normal BS, no masses, no tendernessNEUROLOGICAL: There is no tremor. Speech is clear. Alert to self and place, disoriented to time. PSYCHIATRIC:  Affect and behavior are appropriate  Labs reviewed: Recent Labs    09/10/19 0000 01/01/20 0000  NA 139 139  K 4.0 3.6  CL 103 101  CO2 25* 25*  BUN 13 13  CREATININE 1.5* 1.2*  CALCIUM 9.3 9.6   Recent Labs    09/10/19 0000  AST 13  ALT 10  ALKPHOS 104  ALBUMIN 3.6   Recent Labs    09/10/19 0000 01/01/20 0000  WBC 10.9 9.3  NEUTROABS 8 6  HGB 10.9* 10.1*  HCT 33* 31*  PLT 428* 389   Lab Results  Component Value  Date   TSH 2.34 01/01/2020   Lab Results  Component Value Date   HGBA1C 6.1 09/10/2019   Lab Results  Component Value Date   CHOL 178 08/30/2012   HDL 49 08/30/2012   LDLCALC 92 08/30/2012   TRIG 187 (H) 08/30/2012   CHOLHDL 3.6 08/30/2012     Assessment/Plan  1. PVD (peripheral vascular disease) (HCC) -  Stable, continue Furosemide for the BLE edema  2.  Hypertension associated with diabetes ([place]) -  Stable, continue metoprolol tartrate, losartan and hydralazine -Monitor BPs  3. Seizure disorder (HCC) -No reported seizures, continue treatment levetiracetam  4. Type 2 diabetes mellitus with morbid obesity ([place]) Lab Results  Component Value Date   HGBA1C 6.2 04/07/2020   -  Stable, continue Metformin and CBG checks  5. Gastroesophageal reflux disease without esophagitis -Stable, continue esomeprazole  6. Neurocognitive deficits -  BIMS score 11/15, ranging in moderate cognitive impairment -Continue supportive care and fall precautions    Family/ staff Communication:  Discussed plan of care with resident and charge nurse.  Labs/tests ordered:  None  Goals of care:   Long-term care   Durenda Age, DNP, MSN, FNP-BC [facility] [phone] (Monday-Friday 8:00 a.m. - 5:00 p.m.) [phone] (after hours)

## 2020-05-06 DIAGNOSIS — I872 Venous insufficiency (chronic) (peripheral): Secondary | ICD-10-CM | POA: Diagnosis not present

## 2020-05-06 DIAGNOSIS — M6281 Muscle weakness (generalized): Secondary | ICD-10-CM | POA: Diagnosis not present

## 2020-05-07 DIAGNOSIS — I872 Venous insufficiency (chronic) (peripheral): Secondary | ICD-10-CM | POA: Diagnosis not present

## 2020-05-07 DIAGNOSIS — M6281 Muscle weakness (generalized): Secondary | ICD-10-CM | POA: Diagnosis not present

## 2020-05-08 DIAGNOSIS — M6281 Muscle weakness (generalized): Secondary | ICD-10-CM | POA: Diagnosis not present

## 2020-05-08 DIAGNOSIS — I872 Venous insufficiency (chronic) (peripheral): Secondary | ICD-10-CM | POA: Diagnosis not present

## 2020-05-11 DIAGNOSIS — M6281 Muscle weakness (generalized): Secondary | ICD-10-CM | POA: Diagnosis not present

## 2020-05-11 DIAGNOSIS — I872 Venous insufficiency (chronic) (peripheral): Secondary | ICD-10-CM | POA: Diagnosis not present

## 2020-05-12 DIAGNOSIS — M6281 Muscle weakness (generalized): Secondary | ICD-10-CM | POA: Diagnosis not present

## 2020-05-12 DIAGNOSIS — I872 Venous insufficiency (chronic) (peripheral): Secondary | ICD-10-CM | POA: Diagnosis not present

## 2020-05-13 DIAGNOSIS — M6281 Muscle weakness (generalized): Secondary | ICD-10-CM | POA: Diagnosis not present

## 2020-05-13 DIAGNOSIS — I872 Venous insufficiency (chronic) (peripheral): Secondary | ICD-10-CM | POA: Diagnosis not present

## 2020-05-14 DIAGNOSIS — M6281 Muscle weakness (generalized): Secondary | ICD-10-CM | POA: Diagnosis not present

## 2020-05-14 DIAGNOSIS — Z23 Encounter for immunization: Secondary | ICD-10-CM | POA: Diagnosis not present

## 2020-05-14 DIAGNOSIS — I872 Venous insufficiency (chronic) (peripheral): Secondary | ICD-10-CM | POA: Diagnosis not present

## 2020-05-15 DIAGNOSIS — M6281 Muscle weakness (generalized): Secondary | ICD-10-CM | POA: Diagnosis not present

## 2020-05-15 DIAGNOSIS — I872 Venous insufficiency (chronic) (peripheral): Secondary | ICD-10-CM | POA: Diagnosis not present

## 2020-05-22 ENCOUNTER — Encounter: Payer: Self-pay | Admitting: Adult Health

## 2020-05-22 ENCOUNTER — Non-Acute Institutional Stay (INDEPENDENT_AMBULATORY_CARE_PROVIDER_SITE_OTHER): Payer: Medicare Other | Admitting: Adult Health

## 2020-05-22 DIAGNOSIS — Z0000 Encounter for general adult medical examination without abnormal findings: Secondary | ICD-10-CM

## 2020-05-22 NOTE — Progress Notes (Signed)
Subjective:   Mariah [name] is a 69 y.o. female who presents for Medicare Annual (Subsequent) preventive examination.  Review of Systems     Cardiac Risk Factors include: advanced age (>39men, >17 women);diabetes mellitus;obesity (BMI >30kg/m2)     Objective:    Today's Vitals   05/22/20 1042  BP: (!) 142/78  Pulse: 77  Resp: 18  Temp: (!) 97.5 F (36.4 C)  TempSrc: Oral  Weight: 198 lb 6.4 oz (90 kg)  Height: 5\' 1"  (1.549 m)   Body mass index is 37.49 kg/m.  Advanced Directives 05/22/2020 09/18/2019 09/04/2019 08/15/2019 08/14/2019 07/22/2019 06/06/2019  Does Patient Have a Medical Advance Directive? No Yes Yes Yes Yes Yes Yes  Type of Advance Directive - (No Data) (No Data) (No Data) (No Data) (No Data) (No Data)  Does patient want to make changes to medical advance directive? - No - Patient declined No - Patient declined No - Patient declined No - Patient declined No - Patient declined No - Patient declined  Would patient like information on creating a medical advance directive? No - Patient declined - - - - - -  Pre-existing out of facility DNR order (yellow form or pink MOST form) - - - - - - -    Current Medications (verified) Outpatient Encounter Medications as of 05/22/2020  Medication Sig  . acetaminophen (TYLENOL) 650 MG CR tablet Take 650 mg by mouth every 6 (six) hours as needed for pain.  [name] ADVAIR DISKUS 100-50 MCG/DOSE AEPB Inhale 1 puff into the lungs 2 (two) times daily.  [name] albuterol (PROVENTIL HFA;VENTOLIN HFA) 108 (90 Base) MCG/ACT inhaler Inhale 2 puffs into the lungs every 6 (six) hours as needed for wheezing or shortness of breath.  . bisacodyl (DULCOLAX) 10 MG suppository Constipation (2 of 4): If not relieved by MOM, give 10 mg Bisacodyl suppositiory rectally X 1 dose in 24 hours as needed (Do not use constipation standing orders for residents with renal failure/CFR less than 30. Contact MD for orders)  . cetaphil (CETAPHIL) cream Apply 1 application  topically at bedtime. Apply to bilateral lower legs for dry skin.  . Cholecalciferol (VITAMIN D3) 125 MCG (5000 UT) CAPS Take 1 capsule by mouth daily.   [name] esomeprazole (NEXIUM) 20 MG capsule Take 1 capsule (20 mg total) by mouth every morning. For acid reflex  . furosemide (LASIX) 40 MG tablet TAKE ONE TABLET BY MOUTH EVERY DAY  . hydrALAZINE (APRESOLINE) 25 MG tablet Take 25 mg by mouth in the morning and at bedtime.  . levETIRAcetam (KEPPRA) 500 MG tablet Take 1 tablet (500 mg total) by mouth 2 (two) times daily.  [name] loperamide (IMODIUM A-D) 2 MG tablet Take 2 mg by mouth as needed for diarrhea or loose stools. Give after each stool prn, not to exceed 8 mg in 24 hours  . losartan (COZAAR) 100 MG tablet Take 100 mg by mouth daily.  . magnesium hydroxide (MILK OF MAGNESIA) 400 MG/5ML suspension Constipation (1 of 4): If no BM in 3 days, give 30 cc Milk of Magnesium p.o. x 1 dose in 24 hours as needed (Do not use standing constipation orders for residents with renal failure CFR less than 30. Contact MD for orders)  . Menthol, Topical Analgesic, (BIOFREEZE) 4 % GEL Apply 1 application topically 3 (three) times daily. Apply to bilateral knees for osteoarthritis  . metFORMIN (GLUCOPHAGE) 500 MG tablet Take 500 mg by mouth 2 (two) times daily with a meal.  . metoprolol succinate (  TOPROL-XL) 25 MG 24 hr tablet Take 50 mg by mouth 2 (two) times daily.   . mometasone (NASONEX) 50 MCG/ACT nasal spray Place 1 spray into the nose as needed.  . promethazine (PHENERGAN) 25 MG tablet Take 25 mg by mouth every 6 (six) hours as needed for nausea or vomiting. x3 doses  . protective barrier (RESTORE) CREA Apply topically. Apply barrier cream to buttocks topically BID PRN  . Sodium Fluoride (DENTA 5000 PLUS DT) Place 1 application onto teeth every evening.   No facility-administered encounter medications on file as of 05/22/2020.    Allergies (verified) Penicillins, Naproxen, Codeine, and Indomethacin    History: Past Medical History:  Diagnosis Date  . Allergic rhinitis   . Arthritis    "ankles, feet, knees" (10/30/2017)  . Asthma   . Bursitis   . Carpal tunnel syndrome   . GERD (gastroesophageal reflux disease)   . HTN (hypertension)   . Migraine    "a couple/yr" (10/30/2017)  . Obesity   . Peripheral vascular disease ([place])   . Pneumonia 1960s X 1  . Sciatica   . Tendinitis   . Type II diabetes mellitus (HCC)    Type II  . UTI (urinary tract infection)    Past Surgical History:  Procedure Laterality Date  . CRANIOTOMY N/A 01/19/2018   Procedure: ENDOSCOPIC TRANSPHENOIDAL RESECTION OF PITUITARY TUMOR;  Surgeon: Consuella Lose, MD;  Location: [facility];  Service: Neurosurgery;  Laterality: N/A;  ENDOSCOPIC TRANSPHENOIDAL RESECTION OF PITUITARY TUMOR  . GANGLION CYST EXCISION Left   . KNEE ARTHROSCOPY Left 2003  . PLANTAR FASCIA RELEASE Right 1995   "heel"  . SINUS ENDO WITH FUSION N/A 01/19/2018   Procedure: SINUS ENDO WITH FUSION;  Surgeon: Jerrell Belfast, MD;  Location: [facility];  Service: ENT;  Laterality: N/A;  . TONSILLECTOMY  1958  . TRANSPHENOIDAL APPROACH EXPOSURE N/A 01/19/2018   Procedure: TRANSPHENOIDAL  RESECTION OF PITUITARY TUMOR;  Surgeon: Jerrell Belfast, MD;  Location: [facility];  Service: ENT;  Laterality: N/A;  . [place]; 1997   Dr [name]   Family History  Problem Relation Age of Onset  . Uterine cancer Mother   . Hypertension Mother   . Heart disease Mother   . Hypertension Father   . Alzheimer's disease Father   . Cancer Other    Social History   Socioeconomic History  . Marital status: Single    Spouse name: Not on file  . Number of children: 0  . Years of education: Not on file  . Highest education level: Not on file  Occupational History  . 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 Smoking status: Never Smoker  . Smokeless tobacco: Never  Used  Vaping Use  . Vaping Use: Never used  Substance and Sexual Activity  . Alcohol use: No  . Drug use: No  . Sexual activity: Not Currently    Birth control/protection: None  Other Topics Concern  . 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 Difficulty of Paying Living Expenses: Not on file  Food Insecurity:   . Worried About Charity fundraiser in the Last Year: Not on file  . Ran  Out of Food in the Last Year: Not on file  Transportation Needs:   . Lack of Transportation (Medical): Not on file  . Lack of Transportation (Non-Medical): Not on file  Physical Activity:   . Days of Exercise per Week: Not on file  . Minutes of Exercise per Session: Not on file  Stress:   . Feeling of Stress : Not on file  Social Connections:   . Frequency of Communication with Friends and Family: Not on file  . Frequency of Social Gatherings with Friends and Family: Not on file  . Attends Religious Services: Not on file  . Active Member of Clubs or Organizations: Not on file  . Attends Archivist Meetings: Not on file  . Marital Status: Not on file    Tobacco Counseling Counseling given: Not Answered   Clinical Intake:  Pre-visit preparation completed: No  Pain : No/denies pain     BMI - recorded: 37.41 Nutritional Status: BMI > 30  Obese Nutritional Risks: None Diabetes: Yes CBG done?: Yes CBG resulted in Enter/ Edit results?: Yes (135) Did pt. bring in CBG monitor from home?: No  How often do you need to have someone help you when you read instructions, pamphlets, or other written materials from your doctor or pharmacy?: 3 - Sometimes What is the last grade level you completed in school?: Graduate level  Diabetic? Yes  Interpreter Needed?: No  Information entered by :: [place] of Daily Living In your present state of health, do you have any difficulty performing the following activities: 05/22/2020  Hearing? N  Vision? N  Difficulty concentrating or making decisions? N  Walking or climbing stairs? Y  Comment Lives in LTC  Dressing or bathing? Y  Doing errands, shopping? Y  Preparing Food and eating ? Y  Using the Toilet? Y  In the past six months, have you accidently leaked urine? Y  Do you have problems with loss of bowel control? Y  Housekeeping or managing your Housekeeping? Y  Some recent data might be hidden    Patient Care Team: Hendricks Limes, MD as PCP - General (Internal Medicine) Medina-Vargas, Senaida Lange, NP as Nurse Practitioner (Internal Medicine)  Indicate any recent Medical Services you may have received from other than [facility] providers in the past year (date may be approximate).     Assessment:   This is a routine wellness examination for [place].  Hearing/Vision screen  Hearing Screening   125Hz  250Hz  500Hz  1000Hz  2000Hz  3000Hz  4000Hz  6000Hz  8000Hz   Right ear:           Left ear:           Comments: Not done  Vision Screening Comments: Done 01/02/20  Dietary issues and exercise activities discussed: Current Exercise Habits: Structured exercise class, Type of exercise: stretching;walking, Time (Minutes): 10, Frequency (Times/Week): 3, Weekly Exercise (Minutes/Week): 30, Intensity: Mild, Exercise limited by: neurologic condition(s);respiratory conditions(s)  Goals    .  Exercise 3x per week (30 min per time) (pt-stated)      -  Will ride the bike - More walking - ROM exercises of BUE and BLE      Depression Screen PHQ 2/9 Scores 05/22/2020 03/06/2018 10/20/2017 09/01/2017 08/15/2017 07/28/2017 07/14/2017  PHQ - 2 Score 1 0 0 0 0 1 1    Fall Risk Fall Risk  05/22/2020 02/21/2019 03/06/2018 10/20/2017 09/01/2017  Falls in the past year? 1 (No Data) Yes Yes Yes  Comment -  Emmi Telephone Survey: data to providers prior  to load - - -  Number falls in past yr: 1 (No Data) 1 2 or more 2 or more  Comment - Emmi Telephone Survey Actual Response =  - - 7 times   Injury with Fall? 0 - Yes Yes No  Comment - - - Broken jaw -  Risk Factor Category  - - High Fall Risk High Fall Risk High Fall Risk  Risk for fall due to : History of fall(s);Impaired balance/gait;Impaired mobility - Impaired balance/gait History of fall(s);Impaired mobility;Impaired balance/gait Impaired balance/gait;Impaired mobility  Follow up Falls evaluation completed;Education provided;Falls prevention discussed - Falls prevention discussed - Falls prevention discussed    Any stairs in or around the home? No  If so, are there any without handrails? No  Home free of loose throw rugs in walkways, pet beds, electrical cords, etc? Yes  Adequate lighting in your home to reduce risk of falls? Yes   ASSISTIVE DEVICES UTILIZED TO PREVENT FALLS:  Life alert?  No Use of a cane, walker or w/c? Yes  Grab bars in the bathroom? Yes  Shower chair or bench in shower? Yes  Elevated toilet seat or a handicapped toilet? Yes   TIMED UP AND GO:  Was the test performed? No .  Length of time to ambulate N/A  Gait unsteady with use of assistive device, provider informed and education provided.   Cognitive Function:     6CIT Screen 05/22/2020  What Year? 4 points  What month? 0 points  What time? 0 points  Count back from 20 4 points  Months in reverse 4 points  Repeat phrase 10 points  Total Score 22    Immunizations Immunization History  Administered Date(s) Administered  . Influenza Split 03/08/2013  . Influenza Whole 09/03/2009, 06/03/2010  . Influenza,inj,Quad PF,6+ Mos 06/25/2014  . Influenza-Unspecified 06/08/2017, 03/25/2018, 05/06/2019  . Moderna SARS-COVID-2 Vaccination 08/12/2019, 09/09/2019  . Pneumococcal Polysaccharide-23 07/25/2002, 10/31/2017  . Pneumococcal-Unspecified 05/07/2019  . Td 07/25/2005  . Tdap 05/03/2019    TDAP  status: Up to date Flu Vaccine status: Up to date Pneumococcal vaccine status: Up to date Covid-19 vaccine status: Completed vaccines  Qualifies for Shingles Vaccine? No   Zostavax completed No   Shingrix Completed?: No.    Education has been provided regarding the importance of this vaccine. Patient has been advised to call insurance company to determine out of pocket expense if they have not yet received this vaccine. Advised may also receive vaccine at local pharmacy or Health Dept. Verbalized acceptance and understanding.  Screening Tests Health Maintenance  Topic Date Due  . COLONOSCOPY  Never done  . PNA vac Low Risk Adult (2 of 2 - PCV13) 05/06/2020  . OPHTHALMOLOGY EXAM  07/24/2020 (Originally 09/08/2019)  . Hepatitis C Screening  07/25/2020 (Originally February 16, 1951)  . INFLUENZA VACCINE  10/22/2020 (Originally 02/23/2020)  . MAMMOGRAM  03/03/2021 (Originally 01/14/2017)  . DEXA SCAN  03/03/2021 (Originally 09/02/2017)  . FOOT EXAM  05/23/2020  . HEMOGLOBIN A1C  07/07/2020  . TETANUS/TDAP  05/02/2029  . COVID-19 Vaccine  Completed    Health Maintenance  Health Maintenance Due  Topic Date Due  . COLONOSCOPY  Never done  . PNA vac Low Risk Adult (2 of 2 - PCV13) 05/06/2020    Colorectal cancer screening: No longer required.  Mammogram status: No longer required.   Lung Cancer Screening: (Low Dose CT Chest recommended if Age 51-80 years, 30 pack-year currently smoking OR have quit w/in  15years.does not qualify.   Lung Cancer Screening Referral:  No  Additional Screening:  Hepatitis C Screening: does qualify; Completed Ordered  Vision Screening: Recommended annual ophthalmology exams for early detection of glaucoma and other disorders of the eye. Is the patient up to date with their annual eye exam?  Yes  Who is the provider or what is the name of the office in which the patient attends annual eye exams? Inhouse[facility] If pt is not established with a provider, would they like to  be referred to a provider to establish care? N/A.   Dental Screening: Recommended annual dental exams for proper oral hygiene  Community Resource Referral / Chronic Care Management: CRR required this visit?  Yes   CCM required this visit?  Yes      Plan:     I have personally reviewed and noted the following in the patient's chart:   . Medical and social history . Use of alcohol, tobacco or illicit drugs  . Current medications and supplements . Functional ability and status . Nutritional status . Physical activity . Advanced directives . List of other physicians . Hospitalizations, surgeries, and ER visits in previous 12 months . Vitals . Screenings to include cognitive, depression, and falls . Referrals and appointments  In addition, I have reviewed and discussed with patient certain preventive protocols, quality metrics, and best practice recommendations. A written personalized care plan for preventive services as well as general preventive health recommendations were provided to patient.     Phillips Goulette Medina-Vargas, NP   05/22/2020

## 2020-05-27 DIAGNOSIS — F4323 Adjustment disorder with mixed anxiety and depressed mood: Secondary | ICD-10-CM | POA: Diagnosis not present

## 2020-05-27 DIAGNOSIS — G3184 Mild cognitive impairment, so stated: Secondary | ICD-10-CM | POA: Diagnosis not present

## 2020-06-01 NOTE — Progress Notes (Signed)
Location:  [place] Room Number: 203-A Place of Service:  SNF (31) Provider:  Durenda Age, DNP, FNP-BC  Patient Care Team: Hendricks Limes, MD as PCP - General (Internal Medicine) Medina-Vargas, Senaida Lange, NP as Nurse Practitioner (Internal Medicine)  Extended Emergency Contact Information Primary Emergency Contact: Dameka, Younker Home Phone: [phone] Relation: Sister Secondary Emergency Contact: Sandria Manly Mobile Phone: [phone] Relation: Niece  Code Status:  FULL CODE  Goals of care: Advanced Directive information Advanced Directives 05/22/2020  Does Patient Have a Medical Advance Directive? No  Type of Advance Directive -  Does patient want to make changes to medical advance directive? -  Would patient like information on creating a medical advance directive? No - Patient declined  Pre-existing out of facility DNR order (yellow form or pink MOST form) -     Chief Complaint  Patient presents with  . Medical Management of Chronic Issues    Routine [facility] visit  . Quality Metric Gaps    Stool for occult blood x3 for colon cancer screening    HPI:  Pt is a 69 y.o. female seen today for medical management of chronic diseases.  She is a long-term care resident of [facility].  She has a PMH of diabetes mellitus type 2, PVD, essential hypertension, NASH and history of pituitary microadenoma.  She was seen in the room today. She denies having pain. SBPs ranging from 135 to 154. She denies headache. He is currently taking Hydralazine 25 mg BID, Losartan 100 mg daily and Metoprolol tartrate 50 mg BID for hypertension. CBGs ranging from 86 to 149 . Currently, he is taking Metformin 500 mg BID for diabetes mellitus.     Past Medical History:  Diagnosis Date  . Allergic rhinitis   . Arthritis    "ankles, feet, knees" (10/30/2017)  . Asthma   . Bursitis   . Carpal tunnel syndrome   . GERD  (gastroesophageal reflux disease)   . HTN (hypertension)   . Migraine    "a couple/yr" (10/30/2017)  . Obesity   . Peripheral vascular disease ([place])   . Pneumonia 1960s X 1  . Sciatica   . Tendinitis   . Type II diabetes mellitus (HCC)    Type II  . UTI (urinary tract infection)    Past Surgical History:  Procedure Laterality Date  . CRANIOTOMY N/A 01/19/2018   Procedure: ENDOSCOPIC TRANSPHENOIDAL RESECTION OF PITUITARY TUMOR;  Surgeon: Consuella Lose, MD;  Location: [place];  Service: Neurosurgery;  Laterality: N/A;  ENDOSCOPIC TRANSPHENOIDAL RESECTION OF PITUITARY TUMOR  . GANGLION CYST EXCISION Left   . KNEE ARTHROSCOPY Left 2003  . PLANTAR FASCIA RELEASE Right 1995   "heel"  . SINUS ENDO WITH FUSION N/A 01/19/2018   Procedure: SINUS ENDO WITH FUSION;  Surgeon: Jerrell Belfast, MD;  Location: [place];  Service: ENT;  Laterality: N/A;  . TONSILLECTOMY  1958  . TRANSPHENOIDAL APPROACH EXPOSURE N/A 01/19/2018   Procedure: TRANSPHENOIDAL  RESECTION OF PITUITARY TUMOR;  Surgeon: Jerrell Belfast, MD;  Location: [place];  Service: ENT;  Laterality: N/A;  . [place]; 1997   Dr [name]    Allergies  Allergen Reactions  . Penicillins Hives and Other (See Comments)    ++ got keflex in 2013 and 2019++ Has patient had a PCN reaction causing immediate rash, facial/tongue/throat swelling, SOB or lightheadedness with hypotension: No Has patient had a PCN reaction causing severe rash involving mucus membranes or skin necrosis:  No PATIENT HAS HAD A PCN REACTION THAT REQUIRED HOSPITALIZATION:  #  #  YES  #  #  Has patient had a PCN reaction occurring within the last 10 years: No   . Naproxen     History of GI intolerance with indomethacin.  She is on PPI for GERD.  NSAIDs should not be used long-term or as maintenance medication due to the comorbidities and age as per Beers' List  . Codeine Nausea And Vomiting and Other (See Comments)  . Indomethacin Diarrhea    Outpatient  Encounter Medications as of 06/02/2020  Medication Sig  . acetaminophen (TYLENOL) 650 MG CR tablet Take 650 mg by mouth every 6 (six) hours as needed for pain.  [name] ADVAIR DISKUS 100-50 MCG/DOSE AEPB Inhale 1 puff into the lungs 2 (two) times daily.  [name] albuterol (PROVENTIL HFA;VENTOLIN HFA) 108 (90 Base) MCG/ACT inhaler Inhale 2 puffs into the lungs every 6 (six) hours as needed for wheezing or shortness of breath.  . bisacodyl (DULCOLAX) 10 MG suppository Constipation (2 of 4): If not relieved by MOM, give 10 mg Bisacodyl suppositiory rectally X 1 dose in 24 hours as needed (Do not use constipation standing orders for residents with renal failure/CFR less than 30. Contact MD for orders)  . cetaphil (CETAPHIL) cream Apply 1 application topically at bedtime. Apply to bilateral lower legs for dry skin.  . Cholecalciferol (VITAMIN D3) 125 MCG (5000 UT) CAPS Take 1 capsule by mouth daily.   [name] esomeprazole (NEXIUM) 20 MG capsule Take 1 capsule (20 mg total) by mouth every morning. For acid reflex  . furosemide (LASIX) 40 MG tablet TAKE ONE TABLET BY MOUTH EVERY DAY  . hydrALAZINE (APRESOLINE) 25 MG tablet Take 25 mg by mouth in the morning and at bedtime.  . levETIRAcetam (KEPPRA) 500 MG tablet Take 1 tablet (500 mg total) by mouth 2 (two) times daily.  [name] loperamide (IMODIUM A-D) 2 MG tablet Take 2 mg by mouth as needed for diarrhea or loose stools. Give after each stool prn, not to exceed 8 mg in 24 hours  . losartan (COZAAR) 100 MG tablet Take 100 mg by mouth daily.  . magnesium hydroxide (MILK OF MAGNESIA) 400 MG/5ML suspension Constipation (1 of 4): If no BM in 3 days, give 30 cc Milk of Magnesium p.o. x 1 dose in 24 hours as needed (Do not use standing constipation orders for residents with renal failure CFR less than 30. Contact MD for orders)  . Menthol, Topical Analgesic, (BIOFREEZE) 4 % GEL Apply 1 application topically 3 (three) times daily. Apply to bilateral knees for osteoarthritis  . metFORMIN  (GLUCOPHAGE) 500 MG tablet Take 500 mg by mouth 2 (two) times daily with a meal.  . metoprolol succinate (TOPROL-XL) 25 MG 24 hr tablet Take 50 mg by mouth 2 (two) times daily.   . mometasone (NASONEX) 50 MCG/ACT nasal spray Place 1 spray into the nose as needed.  . promethazine (PHENERGAN) 25 MG tablet Take 25 mg by mouth every 6 (six) hours as needed for nausea or vomiting. x3 doses  . protective barrier (RESTORE) CREA Apply topically. Apply barrier cream to buttocks topically BID PRN  . Sodium Fluoride (DENTA 5000 PLUS DT) Place 1 application onto teeth every evening.   No facility-administered encounter medications on file as of 06/02/2020.    Review of Systems  GENERAL: No change in appetite, no fatigue, no weight changes, no fever, chills or weakness MOUTH and THROAT: Denies oral discomfort, gingival pain  or bleeding RESPIRATORY: no cough, SOB, DOE, wheezing, hemoptysis CARDIAC: No chest pain or palpitations GI: No abdominal pain, diarrhea, constipation, heart burn, nausea or vomiting GU: Denies dysuria, frequency, hematuria, incontinence, or discharge NEUROLOGICAL: Denies dizziness, syncope, numbness, or headache PSYCHIATRIC: Denies feelings of depression or anxiety. No report of hallucinations, insomnia, paranoia, or agitation   Immunization History  Administered Date(s) Administered  . Influenza Split 03/08/2013  . Influenza Whole 09/03/2009, 06/03/2010  . Influenza,inj,Quad PF,6+ Mos 06/25/2014  . Influenza-Unspecified 03/25/2018, 05/06/2019, 05/14/2020  . Moderna SARS-COVID-2 Vaccination 08/12/2019, 09/09/2019  . Pneumococcal Polysaccharide-23 07/25/2002, 10/31/2017  . Pneumococcal-Unspecified 05/07/2019  . Td 07/25/2005  . Tdap 05/03/2019   Pertinent  Health Maintenance Due  Topic Date Due  . COLONOSCOPY  Never done  . PNA vac Low Risk Adult (2 of 2 - PCV13) 05/06/2020  . FOOT EXAM  05/23/2020  . OPHTHALMOLOGY EXAM  07/24/2020 (Originally 09/08/2019)  . MAMMOGRAM   03/03/2021 (Originally 01/14/2017)  . DEXA SCAN  03/03/2021 (Originally 09/02/2017)  . HEMOGLOBIN A1C  07/07/2020  . INFLUENZA VACCINE  Completed   Fall Risk  05/22/2020 02/21/2019 03/06/2018 10/20/2017 09/01/2017  Falls in the past year? 1 (No Data) Yes Yes Yes  Comment - Emmi Telephone Survey: data to providers prior to load - - -  Number falls in past yr: 1 (No Data) 1 2 or more 2 or more  Comment - Emmi Telephone Survey Actual Response =  - - 7 times   Injury with Fall? 0 - Yes Yes No  Comment - - - Broken jaw -  Risk Factor Category  - - High Fall Risk High Fall Risk High Fall Risk  Risk for fall due to : History of fall(s);Impaired balance/gait;Impaired mobility - Impaired balance/gait History of fall(s);Impaired mobility;Impaired balance/gait Impaired balance/gait;Impaired mobility  Follow up Falls evaluation completed;Education provided;Falls prevention discussed - Falls prevention discussed - Falls prevention discussed     Vitals:   06/02/20 1100  BP: 138/79  Pulse: 69  Resp: 19  Temp: (!) 97.5 F (36.4 C)  Weight: 201 lb 12.8 oz (91.5 kg)  Height: 5\' 1"  (1.549 m)   Body mass index is 38.13 kg/m.  Physical Exam  GENERAL APPEARANCE: Well nourished. In no acute distress. Obese SKIN:  Skin is warm and dry.  MOUTH and THROAT: Lips are without lesions. Oral mucosa is moist and without lesions. Tongue is normal in shape, size, and color and without lesions RESPIRATORY: Breathing is even & unlabored, BS CTAB CARDIAC: RRR, no murmur,no extra heart sounds, BLE 2+edema GI: Abdomen soft, normal BS, no masses, no tenderness EXTREMITIES:  Able to move X 4 extremities NEUROLOGICAL: There is no tremor. Speech is clear. Alert to self, disoriented to time and place. PSYCHIATRIC:  Affect and behavior are appropriate  Labs reviewed: Recent Labs    09/10/19 0000 01/01/20 0000  NA 139 139  K 4.0 3.6  CL 103 101  CO2 25* 25*  BUN 13 13  CREATININE 1.5* 1.2*  CALCIUM 9.3 9.6    Recent Labs    09/10/19 0000  AST 13  ALT 10  ALKPHOS 104  ALBUMIN 3.6   Recent Labs    09/10/19 0000 01/01/20 0000  WBC 10.9 9.3  NEUTROABS 8 6  HGB 10.9* 10.1*  HCT 33* 31*  PLT 428* 389   Lab Results  Component Value Date   TSH 2.34 01/01/2020   Lab Results  Component Value Date   HGBA1C 6.2 04/07/2020   Lab Results  Component  Value Date   CHOL 178 08/30/2012   HDL 49 08/30/2012   LDLCALC 92 08/30/2012   TRIG 187 (H) 08/30/2012   CHOLHDL 3.6 08/30/2012    Assessment/Plan  1. Uncontrolled hypertension - will increase dose of hydralazine from 25 mg twice daily to 3 times daily -Continue metoprolol titrate and losartan -Monitor BPs  2. Seizure disorder (HCC) -No recent seizures, continue levetiracetam  3. Lower extremity edema -Encouraged to elevate legs -Continue furosemide  4. Type 2 diabetes mellitus with morbid obesity ([place]) Lab Results  Component Value Date   HGBA1C 6.2 04/07/2020   -Continue Metformin and CBG checks  5. Colon cancer screening -Stool occult blood x3     Family/ staff Communication:   Discussed plan of care with resident and charge nurse.  Labs/tests ordered:   Stool occult blood x3  Goals of care:   Long-term care   Durenda Age, DNP, MSN, FNP-BC [facility] [phone] (Monday-Friday 8:00 a.m. - 5:00 p.m.) [phone] (after hours)

## 2020-06-02 ENCOUNTER — Non-Acute Institutional Stay (SKILLED_NURSING_FACILITY): Payer: Medicare Other | Admitting: Adult Health

## 2020-06-02 ENCOUNTER — Encounter: Payer: Self-pay | Admitting: Adult Health

## 2020-06-02 DIAGNOSIS — E1169 Type 2 diabetes mellitus with other specified complication: Secondary | ICD-10-CM

## 2020-06-02 DIAGNOSIS — G40909 Epilepsy, unspecified, not intractable, without status epilepticus: Secondary | ICD-10-CM | POA: Diagnosis not present

## 2020-06-02 DIAGNOSIS — I10 Essential (primary) hypertension: Secondary | ICD-10-CM | POA: Diagnosis not present

## 2020-06-02 DIAGNOSIS — Z1211 Encounter for screening for malignant neoplasm of colon: Secondary | ICD-10-CM

## 2020-06-02 DIAGNOSIS — R600 Localized edema: Secondary | ICD-10-CM

## 2020-06-03 DIAGNOSIS — F4323 Adjustment disorder with mixed anxiety and depressed mood: Secondary | ICD-10-CM | POA: Diagnosis not present

## 2020-06-03 DIAGNOSIS — G3184 Mild cognitive impairment, so stated: Secondary | ICD-10-CM | POA: Diagnosis not present

## 2020-06-10 ENCOUNTER — Encounter: Payer: Self-pay | Admitting: Adult Health

## 2020-06-10 ENCOUNTER — Non-Acute Institutional Stay (SKILLED_NURSING_FACILITY): Payer: Medicare Other | Admitting: Adult Health

## 2020-06-10 DIAGNOSIS — Z7189 Other specified counseling: Secondary | ICD-10-CM | POA: Diagnosis not present

## 2020-06-10 DIAGNOSIS — F4323 Adjustment disorder with mixed anxiety and depressed mood: Secondary | ICD-10-CM | POA: Diagnosis not present

## 2020-06-10 DIAGNOSIS — G40909 Epilepsy, unspecified, not intractable, without status epilepticus: Secondary | ICD-10-CM | POA: Diagnosis not present

## 2020-06-10 DIAGNOSIS — I152 Hypertension secondary to endocrine disorders: Secondary | ICD-10-CM

## 2020-06-10 DIAGNOSIS — E1159 Type 2 diabetes mellitus with other circulatory complications: Secondary | ICD-10-CM

## 2020-06-10 DIAGNOSIS — E1169 Type 2 diabetes mellitus with other specified complication: Secondary | ICD-10-CM | POA: Diagnosis not present

## 2020-06-10 DIAGNOSIS — R600 Localized edema: Secondary | ICD-10-CM

## 2020-06-10 DIAGNOSIS — G3184 Mild cognitive impairment, so stated: Secondary | ICD-10-CM | POA: Diagnosis not present

## 2020-06-10 NOTE — Progress Notes (Signed)
Location:  [facility] [place] Room Number: 203-A Place of Service:  SNF (31) Provider:  Durenda Age, DNP, FNP-BC  Patient Care Team: Hendricks Limes, MD as PCP - General (Internal Medicine) Medina-Vargas, Senaida Lange, NP as Nurse Practitioner (Internal Medicine)  Extended Emergency Contact Information Primary Emergency Contact: Zohra, Clavel Home Phone: [phone] Relation: Sister Secondary Emergency Contact: Sandria Manly Mobile Phone: [phone] Relation: Niece  Code Status:  FULL CODE  Goals of care: Advanced Directive information Advanced Directives 05/22/2020  Does Patient Have a Medical Advance Directive? No  Type of Advance Directive -  Does patient want to make changes to medical advance directive? -  Would patient like information on creating a medical advance directive? No - Patient declined  Pre-existing out of facility DNR order (yellow form or pink MOST form) -     Chief Complaint  Patient presents with  . Advanced Directive    Patient seen for a Care Plan Meeting    HPI:  Pt is a 69 y.o. female seen today for care plan meeting.  She is a long-term care resident of [facility].  She has a PMH of diabetes mellitus type 2, PVD, essential hypertension, NASH and history of pituitary microadenoma. The care plan meeting was attended by resident, [place] coordinator, NP and social worker. Niece was called but did not answer.  She remains to be full code.  Discussed medications, vital signs and weights.  She currently participates in restorative programs such as 1) walking, 2) transfers, and 3) AROM BUE. Answered medical questions by resident.  The meeting lasted for 25 minutes.   Past Medical History:  Diagnosis Date  . Allergic rhinitis   . Arthritis    "ankles, feet, knees" (10/30/2017)  . Asthma   . Bursitis   . Carpal tunnel syndrome   . GERD (gastroesophageal reflux disease)   . HTN (hypertension)   .  Migraine    "a couple/yr" (10/30/2017)  . Obesity   . Peripheral vascular disease ([place])   . Pneumonia 1960s X 1  . Sciatica   . Tendinitis   . Type II diabetes mellitus (HCC)    Type II  . UTI (urinary tract infection)    Past Surgical History:  Procedure Laterality Date  . CRANIOTOMY N/A 01/19/2018   Procedure: ENDOSCOPIC TRANSPHENOIDAL RESECTION OF PITUITARY TUMOR;  Surgeon: Consuella Lose, MD;  Location: [place];  Service: Neurosurgery;  Laterality: N/A;  ENDOSCOPIC TRANSPHENOIDAL RESECTION OF PITUITARY TUMOR  . GANGLION CYST EXCISION Left   . KNEE ARTHROSCOPY Left 2003  . PLANTAR FASCIA RELEASE Right 1995   "heel"  . SINUS ENDO WITH FUSION N/A 01/19/2018   Procedure: SINUS ENDO WITH FUSION;  Surgeon: Jerrell Belfast, MD;  Location: [place];  Service: ENT;  Laterality: N/A;  . TONSILLECTOMY  1958  . TRANSPHENOIDAL APPROACH EXPOSURE N/A 01/19/2018   Procedure: TRANSPHENOIDAL  RESECTION OF PITUITARY TUMOR;  Surgeon: Jerrell Belfast, MD;  Location: [place];  Service: ENT;  Laterality: N/A;  . [place]; 1997   Dr [name]    Allergies  Allergen Reactions  . Penicillins Hives and Other (See Comments)    ++ got keflex in 2013 and 2019++ Has patient had a PCN reaction causing immediate rash, facial/tongue/throat swelling, SOB or lightheadedness with hypotension: No Has patient had a PCN reaction causing severe rash involving mucus membranes or skin necrosis: No PATIENT HAS HAD A PCN REACTION THAT REQUIRED HOSPITALIZATION:  #  #  YES  #  #  Has patient had a PCN reaction occurring within the last 10 years: No   . Naproxen     History of GI intolerance with indomethacin.  She is on PPI for GERD.  NSAIDs should not be used long-term or as maintenance medication due to the comorbidities and age as per Beers' List  . Codeine Nausea And Vomiting and Other (See Comments)  . Indomethacin Diarrhea    Outpatient Encounter Medications as of 06/10/2020  Medication Sig  .  acetaminophen (TYLENOL) 650 MG CR tablet Take 650 mg by mouth every 6 (six) hours as needed for pain.  [name] ADVAIR DISKUS 100-50 MCG/DOSE AEPB Inhale 1 puff into the lungs 2 (two) times daily.  [name] albuterol (PROVENTIL HFA;VENTOLIN HFA) 108 (90 Base) MCG/ACT inhaler Inhale 2 puffs into the lungs every 6 (six) hours as needed for wheezing or shortness of breath.  . bisacodyl (DULCOLAX) 10 MG suppository Constipation (2 of 4): If not relieved by MOM, give 10 mg Bisacodyl suppositiory rectally X 1 dose in 24 hours as needed (Do not use constipation standing orders for residents with renal failure/CFR less than 30. Contact MD for orders)  . cetaphil (CETAPHIL) cream Apply 1 application topically at bedtime. Apply to bilateral lower legs for dry skin.  . Cholecalciferol (VITAMIN D3) 125 MCG (5000 UT) CAPS Take 1 capsule by mouth daily.   [name] esomeprazole (NEXIUM) 20 MG capsule Take 1 capsule (20 mg total) by mouth every morning. For acid reflex  . furosemide (LASIX) 40 MG tablet TAKE ONE TABLET BY MOUTH EVERY DAY  . hydrALAZINE (APRESOLINE) 25 MG tablet Take 25 mg by mouth in the morning and at bedtime.  . levETIRAcetam (KEPPRA) 500 MG tablet Take 1 tablet (500 mg total) by mouth 2 (two) times daily.  [name] loperamide (IMODIUM A-D) 2 MG tablet Take 2 mg by mouth as needed for diarrhea or loose stools. Give after each stool prn, not to exceed 8 mg in 24 hours  . losartan (COZAAR) 100 MG tablet Take 100 mg by mouth daily.  . magnesium hydroxide (MILK OF MAGNESIA) 400 MG/5ML suspension Constipation (1 of 4): If no BM in 3 days, give 30 cc Milk of Magnesium p.o. x 1 dose in 24 hours as needed (Do not use standing constipation orders for residents with renal failure CFR less than 30. Contact MD for orders)  . Menthol, Topical Analgesic, (BIOFREEZE) 4 % GEL Apply 1 application topically 3 (three) times daily. Apply to bilateral knees for osteoarthritis  . metFORMIN (GLUCOPHAGE) 500 MG tablet Take 500 mg by mouth 2 (two)  times daily with a meal.  . metoprolol succinate (TOPROL-XL) 25 MG 24 hr tablet Take 50 mg by mouth 2 (two) times daily.   . mometasone (NASONEX) 50 MCG/ACT nasal spray Place 1 spray into the nose as needed.  . promethazine (PHENERGAN) 25 MG tablet Take 25 mg by mouth every 6 (six) hours as needed for nausea or vomiting. x3 doses  . protective barrier (RESTORE) CREA Apply topically. Apply barrier cream to buttocks topically BID PRN  . Sodium Fluoride (DENTA 5000 PLUS DT) Place 1 application onto teeth every evening.   No facility-administered encounter medications on file as of 06/10/2020.    Review of Systems  GENERAL: No change in appetite, no fatigue, no weight changes, no fever, chills or weakness MOUTH and THROAT: Denies oral discomfort, gingival pain or bleeding RESPIRATORY: no cough, SOB, DOE, wheezing, hemoptysis CARDIAC: No chest pain, edema or palpitations GI: No abdominal pain, diarrhea,  constipation, heart burn, nausea or vomiting GU: Denies dysuria, frequency, hematuria or discharge NEUROLOGICAL: Denies dizziness, syncope, numbness, or headache PSYCHIATRIC: Denies feelings of depression or anxiety. No report of hallucinations, insomnia, paranoia, or agitation   Immunization History  Administered Date(s) Administered  . Influenza Split 03/08/2013  . Influenza Whole 09/03/2009, 06/03/2010  . Influenza,inj,Quad PF,6+ Mos 06/25/2014  . Influenza-Unspecified 03/25/2018, 05/06/2019, 05/14/2020  . Moderna SARS-COVID-2 Vaccination 08/12/2019, 09/09/2019  . Pneumococcal Polysaccharide-23 07/25/2002, 10/31/2017  . Pneumococcal-Unspecified 05/07/2019  . Td 07/25/2005  . Tdap 05/03/2019   Pertinent  Health Maintenance Due  Topic Date Due  . COLONOSCOPY  Never done  . PNA vac Low Risk Adult (2 of 2 - PCV13) 05/06/2020  . FOOT EXAM  05/23/2020  . OPHTHALMOLOGY EXAM  07/24/2020 (Originally 09/08/2019)  . MAMMOGRAM  03/03/2021 (Originally 01/14/2017)  . DEXA SCAN  03/03/2021  (Originally 09/02/2017)  . HEMOGLOBIN A1C  07/07/2020  . INFLUENZA VACCINE  Completed   Fall Risk  05/22/2020 02/21/2019 03/06/2018 10/20/2017 09/01/2017  Falls in the past year? 1 (No Data) Yes Yes Yes  Comment - Emmi Telephone Survey: data to providers prior to load - - -  Number falls in past yr: 1 (No Data) 1 2 or more 2 or more  Comment - Emmi Telephone Survey Actual Response =  - - 7 times   Injury with Fall? 0 - Yes Yes No  Comment - - - Broken jaw -  Risk Factor Category  - - High Fall Risk High Fall Risk High Fall Risk  Risk for fall due to : History of fall(s);Impaired balance/gait;Impaired mobility - Impaired balance/gait History of fall(s);Impaired mobility;Impaired balance/gait Impaired balance/gait;Impaired mobility  Follow up Falls evaluation completed;Education provided;Falls prevention discussed - Falls prevention discussed - Falls prevention discussed     Vitals:   06/10/20 1117 06/10/20 1118  BP: (!) 161/87 134/74  Pulse: 91   Resp: 20   Temp: 97.9 F (36.6 C)   TempSrc: Oral   Weight: 201 lb 12.8 oz (91.5 kg)   Height: 5\' 1"  (1.549 m)    Body mass index is 38.13 kg/m.  Physical Exam  GENERAL APPEARANCE: Well nourished. In no acute distress. Obese SKIN:  Skin is warm and dry.  MOUTH and THROAT: Lips are without lesions. Oral mucosa is moist and without lesions. Tongue is normal in shape, size, and color and without lesions RESPIRATORY: Breathing is even & unlabored, BS CTAB CARDIAC: RRR, no murmur,no extra heart sounds, BLE 2+ edema GI: Abdomen soft, normal BS, no masses, no tenderness EXTREMITIES:  Able to move X 4 extremities NEUROLOGICAL: There is no tremor. Speech is clear. Alert to self, disoriented to time and place. PSYCHIATRIC:  Affect and behavior are appropriate  Labs reviewed: Recent Labs    09/10/19 0000 01/01/20 0000  NA 139 139  K 4.0 3.6  CL 103 101  CO2 25* 25*  BUN 13 13  CREATININE 1.5* 1.2*  CALCIUM 9.3 9.6   Recent Labs     09/10/19 0000  AST 13  ALT 10  ALKPHOS 104  ALBUMIN 3.6   Recent Labs    09/10/19 0000 01/01/20 0000  WBC 10.9 9.3  NEUTROABS 8 6  HGB 10.9* 10.1*  HCT 33* 31*  PLT 428* 389   Lab Results  Component Value Date   TSH 2.34 01/01/2020   Lab Results  Component Value Date   HGBA1C 6.2 04/07/2020   Lab Results  Component Value Date   CHOL 178 08/30/2012  HDL 49 08/30/2012   LDLCALC 92 08/30/2012   TRIG 187 (H) 08/30/2012   CHOLHDL 3.6 08/30/2012    Assessment/Plan  1. ACP (advance care planning) -Remains to be full code -Discussed medications, vital signs and weights  2. Hypertension associated with diabetes ([place]) -  BPs stable, continue metoprolol tartrate, hydralazine and losartan  3. Seizure disorder ([place]) -   No recent seizures, continue Keppra  4. Lower extremity edema -Stable, continue furosemide -Encourage elevation of lower extremities  5. Type 2 diabetes mellitus with morbid obesity ([place]) Lab Results  Component Value Date   HGBA1C 6.2 04/07/2020   -Continue Metformin and CBG checks     Family/ staff Communication: Discussed medications, vital signs and weights.  Labs/tests ordered: None  Goals of care:   Long-term care   Durenda Age, DNP, MSN, FNP-BC [facility] [phone] (Monday-Friday 8:00 a.m. - 5:00 p.m.) [phone] (after hours)

## 2020-06-15 DIAGNOSIS — F4323 Adjustment disorder with mixed anxiety and depressed mood: Secondary | ICD-10-CM | POA: Diagnosis not present

## 2020-06-15 DIAGNOSIS — G3184 Mild cognitive impairment, so stated: Secondary | ICD-10-CM | POA: Diagnosis not present

## 2020-06-24 DIAGNOSIS — G3184 Mild cognitive impairment, so stated: Secondary | ICD-10-CM | POA: Diagnosis not present

## 2020-06-24 DIAGNOSIS — F4323 Adjustment disorder with mixed anxiety and depressed mood: Secondary | ICD-10-CM | POA: Diagnosis not present

## 2020-06-26 DIAGNOSIS — J069 Acute upper respiratory infection, unspecified: Secondary | ICD-10-CM | POA: Diagnosis not present

## 2020-06-26 DIAGNOSIS — Z20818 Contact with and (suspected) exposure to other bacterial communicable diseases: Secondary | ICD-10-CM | POA: Diagnosis not present

## 2020-07-01 DIAGNOSIS — G3184 Mild cognitive impairment, so stated: Secondary | ICD-10-CM | POA: Diagnosis not present

## 2020-07-01 DIAGNOSIS — F411 Generalized anxiety disorder: Secondary | ICD-10-CM | POA: Diagnosis not present

## 2020-07-08 DIAGNOSIS — G3184 Mild cognitive impairment, so stated: Secondary | ICD-10-CM | POA: Diagnosis not present

## 2020-07-08 DIAGNOSIS — F411 Generalized anxiety disorder: Secondary | ICD-10-CM | POA: Diagnosis not present

## 2020-07-09 ENCOUNTER — Encounter: Payer: Self-pay | Admitting: Adult Health

## 2020-07-09 ENCOUNTER — Non-Acute Institutional Stay (SKILLED_NURSING_FACILITY): Payer: Medicare Other | Admitting: Adult Health

## 2020-07-09 DIAGNOSIS — I152 Hypertension secondary to endocrine disorders: Secondary | ICD-10-CM | POA: Diagnosis not present

## 2020-07-09 DIAGNOSIS — G40909 Epilepsy, unspecified, not intractable, without status epilepticus: Secondary | ICD-10-CM | POA: Diagnosis not present

## 2020-07-09 DIAGNOSIS — K219 Gastro-esophageal reflux disease without esophagitis: Secondary | ICD-10-CM | POA: Diagnosis not present

## 2020-07-09 DIAGNOSIS — E1169 Type 2 diabetes mellitus with other specified complication: Secondary | ICD-10-CM

## 2020-07-09 DIAGNOSIS — R600 Localized edema: Secondary | ICD-10-CM

## 2020-07-09 DIAGNOSIS — E1159 Type 2 diabetes mellitus with other circulatory complications: Secondary | ICD-10-CM

## 2020-07-09 NOTE — Progress Notes (Signed)
Location:  No Name Room Number: 203-A Place of Service:  SNF (31) Provider:  Durenda Age, DNP, FNP-BC  Patient Care Team: Hendricks Limes, MD as PCP - General (Internal Medicine) Medina-Vargas, Senaida Lange, NP as Nurse Practitioner (Internal Medicine)  Extended Emergency Contact Information Primary Emergency Contact: Nashalie, Sallis Home Phone: [phone] Relation: Sister Secondary Emergency Contact: Sandria Manly Mobile Phone: [phone] Relation: Niece  Code Status:  FULL CODE  Goals of care: Advanced Directive information Advanced Directives 05/22/2020  Does Patient Have a Medical Advance Directive? No  Type of Advance Directive -  Does patient want to make changes to medical advance directive? -  Would patient like information on creating a medical advance directive? No - Patient declined  Pre-existing out of facility DNR order (yellow form or pink MOST form) -     Chief Complaint  Patient presents with  . Medical Management of Chronic Issues    Routine [facility] visit    HPI:  Pt is a 69 y.o. female seen today for medical management of chronic diseases.  She is a long-term care resident of [facility].  She has a PMH of diabetes mellitus type 2, PVD, essential hypertension, NASH and history of pituitary microadenoma. No reported seizures. She takes Levetiracetam 500 mg BID for seizure. SBPs ranging from 117 to 148. She takes Metoprolol tartrate 50 mg BID, Losartan 100 mg daily and Hydralazine 25 mg TID for hypertension.   Past Medical History:  Diagnosis Date  . Allergic rhinitis   . Arthritis    "ankles, feet, knees" (10/30/2017)  . Asthma   . Bursitis   . Carpal tunnel syndrome   . GERD (gastroesophageal reflux disease)   . HTN (hypertension)   . Migraine    "a couple/yr" (10/30/2017)  . Obesity   . Peripheral vascular disease ([place])   . Pneumonia 1960s X 1  . Sciatica   . Tendinitis   .  Type II diabetes mellitus (HCC)    Type II  . UTI (urinary tract infection)    Past Surgical History:  Procedure Laterality Date  . CRANIOTOMY N/A 01/19/2018   Procedure: ENDOSCOPIC TRANSPHENOIDAL RESECTION OF PITUITARY TUMOR;  Surgeon: Consuella Lose, MD;  Location: [place];  Service: Neurosurgery;  Laterality: N/A;  ENDOSCOPIC TRANSPHENOIDAL RESECTION OF PITUITARY TUMOR  . GANGLION CYST EXCISION Left   . KNEE ARTHROSCOPY Left 2003  . PLANTAR FASCIA RELEASE Right 1995   "heel"  . SINUS ENDO WITH FUSION N/A 01/19/2018   Procedure: SINUS ENDO WITH FUSION;  Surgeon: Jerrell Belfast, MD;  Location: [place];  Service: ENT;  Laterality: N/A;  . TONSILLECTOMY  1958  . TRANSPHENOIDAL APPROACH EXPOSURE N/A 01/19/2018   Procedure: TRANSPHENOIDAL  RESECTION OF PITUITARY TUMOR;  Surgeon: Jerrell Belfast, MD;  Location: [place];  Service: ENT;  Laterality: N/A;  . [place]; 1997   Dr [name]    Allergies  Allergen Reactions  . Penicillins Hives and Other (See Comments)    ++ got keflex in 2013 and 2019++ Has patient had a PCN reaction causing immediate rash, facial/tongue/throat swelling, SOB or lightheadedness with hypotension: No Has patient had a PCN reaction causing severe rash involving mucus membranes or skin necrosis: No PATIENT HAS HAD A PCN REACTION THAT REQUIRED HOSPITALIZATION:  #  #  YES  #  #  Has patient had a PCN reaction occurring within the last 10 years: No   . Naproxen     History  of GI intolerance with indomethacin.  She is on PPI for GERD.  NSAIDs should not be used long-term or as maintenance medication due to the comorbidities and age as per Beers' List  . Codeine Nausea And Vomiting and Other (See Comments)  . Indomethacin Diarrhea    Outpatient Encounter Medications as of 07/09/2020  Medication Sig  . acetaminophen (TYLENOL) 650 MG CR tablet Take 650 mg by mouth every 6 (six) hours as needed for pain.  [name] ADVAIR DISKUS 100-50 MCG/DOSE AEPB Inhale 1  puff into the lungs 2 (two) times daily.  [name] albuterol (PROVENTIL HFA;VENTOLIN HFA) 108 (90 Base) MCG/ACT inhaler Inhale 2 puffs into the lungs every 6 (six) hours as needed for wheezing or shortness of breath.  . bisacodyl (DULCOLAX) 10 MG suppository Constipation (2 of 4): If not relieved by MOM, give 10 mg Bisacodyl suppositiory rectally X 1 dose in 24 hours as needed (Do not use constipation standing orders for residents with renal failure/CFR less than 30. Contact MD for orders)  . cetaphil (CETAPHIL) cream Apply 1 application topically at bedtime. Apply to bilateral lower legs for dry skin.  . Cholecalciferol (VITAMIN D3) 125 MCG (5000 UT) CAPS Take 1 capsule by mouth daily.   [name] esomeprazole (NEXIUM) 20 MG capsule Take 1 capsule (20 mg total) by mouth every morning. For acid reflex  . furosemide (LASIX) 40 MG tablet TAKE ONE TABLET BY MOUTH EVERY DAY  . hydrALAZINE (APRESOLINE) 25 MG tablet Take 25 mg by mouth 3 (three) times daily.   [name] levETIRAcetam (KEPPRA) 500 MG tablet Take 1 tablet (500 mg total) by mouth 2 (two) times daily.  [name] loperamide (IMODIUM A-D) 2 MG tablet Take 2 mg by mouth as needed for diarrhea or loose stools. Give after each stool prn, not to exceed 8 mg in 24 hours  . losartan (COZAAR) 100 MG tablet Take 100 mg by mouth daily.  . magnesium hydroxide (MILK OF MAGNESIA) 400 MG/5ML suspension Constipation (1 of 4): If no BM in 3 days, give 30 cc Milk of Magnesium p.o. x 1 dose in 24 hours as needed (Do not use standing constipation orders for residents with renal failure CFR less than 30. Contact MD for orders)  . Menthol, Topical Analgesic, (BIOFREEZE) 4 % GEL Apply 1 application topically 3 (three) times daily. Apply to bilateral knees for osteoarthritis  . metFORMIN (GLUCOPHAGE) 500 MG tablet Take 500 mg by mouth 2 (two) times daily with a meal.  . metoprolol succinate (TOPROL-XL) 25 MG 24 hr tablet Take 50 mg by mouth 2 (two) times daily.   . mometasone (NASONEX) 50 MCG/ACT  nasal spray Place 1 spray into the nose as needed.  . promethazine (PHENERGAN) 25 MG tablet Take 25 mg by mouth every 6 (six) hours as needed for nausea or vomiting. x3 doses  . protective barrier (RESTORE) CREA Apply topically. Apply barrier cream to buttocks topically BID PRN  . Sodium Fluoride (DENTA 5000 PLUS DT) Place 1 application onto teeth every evening.   No facility-administered encounter medications on file as of 07/09/2020.    Review of Systems  GENERAL: No change in appetite, no fatigue, no weight changes, no fever, chills or weakness MOUTH and THROAT: Denies oral discomfort, gingival pain or bleeding RESPIRATORY: no cough, SOB, DOE, wheezing, hemoptysis CARDIAC: No chest pain or palpitations GI: No abdominal pain, diarrhea, constipation, heart burn, nausea or vomiting GU: Denies dysuria, frequency, hematuria, incontinence, or discharge NEUROLOGICAL: Denies dizziness, syncope, numbness, or headache PSYCHIATRIC: Denies feelings  of depression or anxiety. No report of hallucinations, insomnia, paranoia, or agitation   Immunization History  Administered Date(s) Administered  . Influenza Split 03/08/2013  . Influenza Whole 09/03/2009, 06/03/2010  . Influenza,inj,Quad PF,6+ Mos 06/25/2014  . Influenza-Unspecified 03/25/2018, 05/06/2019, 05/14/2020  . Moderna Sars-Covid-2 Vaccination 08/12/2019, 09/09/2019  . Pneumococcal Polysaccharide-23 07/25/2002, 10/31/2017  . Pneumococcal-Unspecified 05/07/2019  . Td 07/25/2005  . Tdap 05/03/2019   Pertinent  Health Maintenance Due  Topic Date Due  . COLONOSCOPY  Never done  . PNA vac Low Risk Adult (2 of 2 - PCV13) 05/06/2020  . FOOT EXAM  05/23/2020  . HEMOGLOBIN A1C  07/07/2020  . OPHTHALMOLOGY EXAM  07/24/2020 (Originally 09/08/2019)  . MAMMOGRAM  03/03/2021 (Originally 01/14/2017)  . DEXA SCAN  03/03/2021 (Originally 09/02/2017)  . INFLUENZA VACCINE  Completed   Fall Risk  05/22/2020 02/21/2019 03/06/2018 10/20/2017 09/01/2017   Falls in the past year? 1 (No Data) Yes Yes Yes  Comment - Emmi Telephone Survey: data to providers prior to load - - -  Number falls in past yr: 1 (No Data) 1 2 or more 2 or more  Comment - Emmi Telephone Survey Actual Response =  - - 7 times   Injury with Fall? 0 - Yes Yes No  Comment - - - Broken jaw -  Risk Factor Category  - - High Fall Risk High Fall Risk High Fall Risk  Risk for fall due to : History of fall(s);Impaired balance/gait;Impaired mobility - Impaired balance/gait History of fall(s);Impaired mobility;Impaired balance/gait Impaired balance/gait;Impaired mobility  Follow up Falls evaluation completed;Education provided;Falls prevention discussed - Falls prevention discussed - Falls prevention discussed     Vitals:   07/09/20 1359  BP: 134/74  Pulse: 69  Resp: 16  Temp: 97.7 F (36.5 C)  TempSrc: Oral  Weight: 204 lb 6.4 oz (92.7 kg)  Height: 5\' 1"  (1.549 m)   Body mass index is 38.62 kg/m.  Physical Exam  GENERAL APPEARANCE: Well nourished. In no acute distress. Morbidly obese. SKIN:  Skin is warm and dry.  MOUTH and THROAT: Lips are without lesions. Oral mucosa is moist and without lesions. Tongue is normal in shape, size, and color and without lesions RESPIRATORY: Breathing is even & unlabored, BS CTAB CARDIAC: RRR, no murmur,no extra heart sounds, BLE 1+ edema GI: Abdomen soft, normal BS, no masses, no tenderness EXTREMITIES:  Able to move X 4 extremities NEUROLOGICAL: There is no tremor. Speech is clear. Alert to self, disoriented to time and place. PSYCHIATRIC:  Affect and behavior are appropriate  Labs reviewed: Recent Labs    09/10/19 0000 01/01/20 0000  NA 139 139  K 4.0 3.6  CL 103 101  CO2 25* 25*  BUN 13 13  CREATININE 1.5* 1.2*  CALCIUM 9.3 9.6   Recent Labs    09/10/19 0000  AST 13  ALT 10  ALKPHOS 104  ALBUMIN 3.6   Recent Labs    09/10/19 0000 01/01/20 0000  WBC 10.9 9.3  NEUTROABS 8 6  HGB 10.9* 10.1*  HCT 33* 31*   PLT 428* 389   Lab Results  Component Value Date   TSH 2.34 01/01/2020   Lab Results  Component Value Date   HGBA1C 6.2 04/07/2020   Lab Results  Component Value Date   CHOL 178 08/30/2012   HDL 49 08/30/2012   LDLCALC 92 08/30/2012   TRIG 187 (H) 08/30/2012   CHOLHDL 3.6 08/30/2012    Assessment/Plan  1. Seizure disorder ([place]) -  No  recent seizures, continue Levetiracetam -  Seizure precauitions  2. Hypertension associated with diabetes ([place]) -  Improved, continue Losartan, Hydralazine and Metoprolol tartrate  3. Lower extremity edema - stable, continue Furosemide -  Encourage to elevate BLE   4. Type 2 diabetes mellitus with morbid obesity ([place]) Lab Results  Component Value Date   HGBA1C 6.2 04/07/2020   -  CBGs stable, continue Metformin  5. Gastroesophageal reflux disease without esophagitis -  Stable, continue Esomeprazole     Family/ staff Communication:  Discussed plan of care with resident and charge nurse.  Labs/tests ordered:  None  Goals of care:   Long-term care   Durenda Age, DNP, MSN, FNP-BC [facility] [phone] (Monday-Friday 8:00 a.m. - 5:00 p.m.) [phone] (after hours)

## 2020-08-03 ENCOUNTER — Non-Acute Institutional Stay (SKILLED_NURSING_FACILITY): Payer: Medicare Other | Admitting: Adult Health

## 2020-08-03 ENCOUNTER — Encounter: Payer: Self-pay | Admitting: Adult Health

## 2020-08-03 DIAGNOSIS — E1159 Type 2 diabetes mellitus with other circulatory complications: Secondary | ICD-10-CM

## 2020-08-03 DIAGNOSIS — G40909 Epilepsy, unspecified, not intractable, without status epilepticus: Secondary | ICD-10-CM | POA: Diagnosis not present

## 2020-08-03 DIAGNOSIS — R600 Localized edema: Secondary | ICD-10-CM | POA: Diagnosis not present

## 2020-08-03 DIAGNOSIS — K219 Gastro-esophageal reflux disease without esophagitis: Secondary | ICD-10-CM

## 2020-08-03 DIAGNOSIS — I152 Hypertension secondary to endocrine disorders: Secondary | ICD-10-CM

## 2020-08-03 DIAGNOSIS — E119 Type 2 diabetes mellitus without complications: Secondary | ICD-10-CM | POA: Diagnosis not present

## 2020-08-03 NOTE — Progress Notes (Signed)
Location:  [place] Room Number: 203-A Place of Service:  SNF (31) Provider:  Durenda Age, DNP, FNP-BC  Patient Care Team: Hendricks Limes, MD as PCP - General (Internal Medicine) Medina-Vargas, Senaida Lange, NP as Nurse Practitioner (Internal Medicine)  Extended Emergency Contact Information Primary Emergency Contact: Janki, Dike Home Phone: [phone] Relation: Sister Secondary Emergency Contact: Sandria Manly Mobile Phone: [phone] Relation: Niece  Code Status:  FULL CODE  Goals of care: Advanced Directive information Advanced Directives 05/22/2020  Does Patient Have a Medical Advance Directive? No  Type of Advance Directive -  Does patient want to make changes to medical advance directive? -  Would patient like information on creating a medical advance directive? No - Patient declined  Pre-existing out of facility DNR order (yellow form or pink MOST form) -     Chief Complaint  Patient presents with  . Medical Management of Chronic Issues    Routine [facility] visit    HPI:  Pt is a 70 y.o. female seen today for medical management of chronic diseases. She is a long-term care resident of [facility]. She has a PMH of type 2 diabetes mellitus, essential hypertension, NASH and history of pituitary microadenoma. She currently participates in restorative programs such as 1) AROM BUE, 2) walking up to 15 ft X 3 trials, and 3) transfers. The 1st stool occult blood was negative. CBGs ranging from 91 to 159. She takes Metformin 500 mg BID for type 2 diabetes mellitus. No recent seizures. She takes Levetiracetam 500 mg BID for seizure.      Past Medical History:  Diagnosis Date  . Allergic rhinitis   . Arthritis    "ankles, feet, knees" (10/30/2017)  . Asthma   . Bursitis   . Carpal tunnel syndrome   . GERD (gastroesophageal reflux disease)   . HTN (hypertension)   . Migraine    "a couple/yr" (10/30/2017)   . Obesity   . Peripheral vascular disease ([place])   . Pneumonia 1960s X 1  . Sciatica   . Tendinitis   . Type II diabetes mellitus (HCC)    Type II  . UTI (urinary tract infection)    Past Surgical History:  Procedure Laterality Date  . CRANIOTOMY N/A 01/19/2018   Procedure: ENDOSCOPIC TRANSPHENOIDAL RESECTION OF PITUITARY TUMOR;  Surgeon: Consuella Lose, MD;  Location: [place];  Service: Neurosurgery;  Laterality: N/A;  ENDOSCOPIC TRANSPHENOIDAL RESECTION OF PITUITARY TUMOR  . GANGLION CYST EXCISION Left   . KNEE ARTHROSCOPY Left 2003  . PLANTAR FASCIA RELEASE Right 1995   "heel"  . SINUS ENDO WITH FUSION N/A 01/19/2018   Procedure: SINUS ENDO WITH FUSION;  Surgeon: Jerrell Belfast, MD;  Location: [place];  Service: ENT;  Laterality: N/A;  . TONSILLECTOMY  1958  . TRANSPHENOIDAL APPROACH EXPOSURE N/A 01/19/2018   Procedure: TRANSPHENOIDAL  RESECTION OF PITUITARY TUMOR;  Surgeon: Jerrell Belfast, MD;  Location: [place];  Service: ENT;  Laterality: N/A;  . [place]; 1997   Dr [name]    Allergies  Allergen Reactions  . Penicillins Hives and Other (See Comments)    ++ got keflex in 2013 and 2019++ Has patient had a PCN reaction causing immediate rash, facial/tongue/throat swelling, SOB or lightheadedness with hypotension: No Has patient had a PCN reaction causing severe rash involving mucus membranes or skin necrosis: No PATIENT HAS HAD A PCN REACTION THAT REQUIRED HOSPITALIZATION:  #  #  YES  #  #  Has patient had a PCN reaction occurring within the last 10 years: No   . Naproxen     History of GI intolerance with indomethacin.  She is on PPI for GERD.  NSAIDs should not be used long-term or as maintenance medication due to the comorbidities and age as per Beers' List  . Codeine Nausea And Vomiting and Other (See Comments)  . Indomethacin Diarrhea    Outpatient Encounter Medications as of 08/03/2020  Medication Sig  . acetaminophen (TYLENOL) 650 MG CR tablet  Take 650 mg by mouth every 6 (six) hours as needed for pain.  [name] ADVAIR DISKUS 100-50 MCG/DOSE AEPB Inhale 1 puff into the lungs 2 (two) times daily.  [name] albuterol (PROVENTIL HFA;VENTOLIN HFA) 108 (90 Base) MCG/ACT inhaler Inhale 2 puffs into the lungs every 6 (six) hours as needed for wheezing or shortness of breath.  . bisacodyl (DULCOLAX) 10 MG suppository Constipation (2 of 4): If not relieved by MOM, give 10 mg Bisacodyl suppositiory rectally X 1 dose in 24 hours as needed (Do not use constipation standing orders for residents with renal failure/CFR less than 30. Contact MD for orders)  . cetaphil (CETAPHIL) cream Apply 1 application topically at bedtime. Apply to bilateral lower legs for dry skin.  . Cholecalciferol (VITAMIN D3) 125 MCG (5000 UT) CAPS Take 1 capsule by mouth daily.   [name] esomeprazole (NEXIUM) 20 MG capsule Take 1 capsule (20 mg total) by mouth every morning. For acid reflex  . furosemide (LASIX) 40 MG tablet TAKE ONE TABLET BY MOUTH EVERY DAY  . hydrALAZINE (APRESOLINE) 25 MG tablet Take 25 mg by mouth 3 (three) times daily.   [name] levETIRAcetam (KEPPRA) 500 MG tablet Take 1 tablet (500 mg total) by mouth 2 (two) times daily.  [name] loperamide (IMODIUM A-D) 2 MG tablet Take 2 mg by mouth as needed for diarrhea or loose stools. Give after each stool prn, not to exceed 8 mg in 24 hours  . losartan (COZAAR) 100 MG tablet Take 100 mg by mouth daily.  . magnesium hydroxide (MILK OF MAGNESIA) 400 MG/5ML suspension Constipation (1 of 4): If no BM in 3 days, give 30 cc Milk of Magnesium p.o. x 1 dose in 24 hours as needed (Do not use standing constipation orders for residents with renal failure CFR less than 30. Contact MD for orders)  . Menthol, Topical Analgesic, 4 % GEL Apply 1 application topically 3 (three) times daily. Apply to bilateral knees for osteoarthritis  . metFORMIN (GLUCOPHAGE) 500 MG tablet Take 500 mg by mouth 2 (two) times daily with a meal.  . metoprolol succinate (TOPROL-XL)  25 MG 24 hr tablet Take 50 mg by mouth 2 (two) times daily.   . mometasone (NASONEX) 50 MCG/ACT nasal spray Place 1 spray into the nose as needed.  . promethazine (PHENERGAN) 25 MG tablet Take 25 mg by mouth every 6 (six) hours as needed for nausea or vomiting. x3 doses  . protective barrier (RESTORE) CREA Apply topically. Apply barrier cream to buttocks topically BID PRN  . Sodium Fluoride (DENTA 5000 PLUS DT) Place 1 application onto teeth every evening.   No facility-administered encounter medications on file as of 08/03/2020.    Review of Systems  GENERAL: No change in appetite, no fatigue, no weight changes, no fever, chills or weakness MOUTH and THROAT: Denies oral discomfort, gingival pain or bleeding RESPIRATORY: no cough, SOB, DOE, wheezing, hemoptysis CARDIAC: No chest pain or palpitations GI: No abdominal pain, diarrhea, constipation, heart burn,  nausea or vomiting GU: Denies dysuria, frequency, hematuria or discharge NEUROLOGICAL: Denies dizziness, syncope, numbness, or headache PSYCHIATRIC: Denies feelings of depression or anxiety. No report of hallucinations, insomnia, paranoia, or agitation    Immunization History  Administered Date(s) Administered  . Influenza Split 03/08/2013  . Influenza Whole 09/03/2009, 06/03/2010  . Influenza,inj,Quad PF,6+ Mos 06/25/2014  . Influenza-Unspecified 03/25/2018, 05/06/2019, 05/14/2020  . Moderna Sars-Covid-2 Vaccination 08/12/2019, 09/09/2019  . Pneumococcal Polysaccharide-23 07/25/2002, 10/31/2017  . Pneumococcal-Unspecified 05/07/2019  . Td 07/25/2005  . Tdap 05/03/2019   Pertinent  Health Maintenance Due  Topic Date Due  . COLONOSCOPY (Pts 45-23yrs Insurance coverage will need to be confirmed)  Never done  . OPHTHALMOLOGY EXAM  09/08/2019  . PNA vac Low Risk Adult (2 of 2 - PCV13) 05/06/2020  . FOOT EXAM  05/23/2020  . HEMOGLOBIN A1C  07/07/2020  . MAMMOGRAM  03/03/2021 (Originally 01/14/2017)  . DEXA SCAN  03/03/2021  (Originally 09/02/2017)  . INFLUENZA VACCINE  Completed   Fall Risk  05/22/2020 02/21/2019 03/06/2018 10/20/2017 09/01/2017  Falls in the past year? 1 (No Data) Yes Yes Yes  Comment - Emmi Telephone Survey: data to providers prior to load - - -  Number falls in past yr: 1 (No Data) 1 2 or more 2 or more  Comment - Emmi Telephone Survey Actual Response =  - - 7 times   Injury with Fall? 0 - Yes Yes No  Comment - - - Broken jaw -  Risk Factor Category  - - High Fall Risk High Fall Risk High Fall Risk  Risk for fall due to : History of fall(s);Impaired balance/gait;Impaired mobility - Impaired balance/gait History of fall(s);Impaired mobility;Impaired balance/gait Impaired balance/gait;Impaired mobility  Follow up Falls evaluation completed;Education provided;Falls prevention discussed - Falls prevention discussed - Falls prevention discussed     Vitals:   08/03/20 1617  BP: 135/73  Pulse: 87  Resp: 18  Temp: (!) 97.3 F (36.3 C)  TempSrc: Oral  Weight: 201 lb 6.4 oz (91.4 kg)  Height: 5\' 1"  (1.549 m)   Body mass index is 38.05 kg/m.  Physical Exam  GENERAL APPEARANCE: Well nourished. In no acute distress. Morbidly obese SKIN:  Skin is warm and dry.  MOUTH and THROAT: Lips are without lesions. Oral mucosa is moist and without lesions. Tongue is normal in shape, size, and color and without lesions RESPIRATORY: Breathing is even & unlabored, BS CTAB CARDIAC: RRR, no murmur,no extra heart sounds, BLE 2+edema GI: Abdomen soft, normal BS, no masses, no tenderness EXTREMITIES:  Able to move X 4 extremities NEUROLOGICAL: There is no tremor. Speech is clear. Alert to self, disoriented to time and place. PSYCHIATRIC:  Affect and behavior are appropriate  Labs reviewed: Recent Labs    09/10/19 0000 01/01/20 0000  NA 139 139  K 4.0 3.6  CL 103 101  CO2 25* 25*  BUN 13 13  CREATININE 1.5* 1.2*  CALCIUM 9.3 9.6   Recent Labs    09/10/19 0000  AST 13  ALT 10  ALKPHOS 104  ALBUMIN  3.6   Recent Labs    09/10/19 0000 01/01/20 0000  WBC 10.9 9.3  NEUTROABS 8 6  HGB 10.9* 10.1*  HCT 33* 31*  PLT 428* 389   Lab Results  Component Value Date   TSH 2.34 01/01/2020   Lab Results  Component Value Date   HGBA1C 6.2 04/07/2020   Lab Results  Component Value Date   CHOL 178 08/30/2012   HDL 49 08/30/2012  LDLCALC 92 08/30/2012   TRIG 187 (H) 08/30/2012   CHOLHDL 3.6 08/30/2012    Assessment/Plan  1. Type 2 diabetes mellitus treated without insulin (HCC) Lab Results  Component Value Date   HGBA1C 6.2 04/07/2020   -  CBGs stable, continue Metformin and CBG checks  2. Hypertension associated with diabetes ([place]) -  BPs stable, continue Metoprolol tartrate, Losartan and Hydralazine  3. Lower extremity edema -  Stable, continue Furosemide -  Encourage elevation of BLE when in bed  4. Seizure disorder ([place]) -  No recent seizures, continue Levetiracetam  5. Gastroesophageal reflux disease without esophagitis -  Stable, continue Esomeprazole     Family/ staff Communication:  Discussed plan of care with resident and charge nurse.  Labs/tests ordered:  None  Goals of care:   Long-term care   Durenda Age, DNP, MSN, FNP-BC [facility] [phone] (Monday-Friday 8:00 a.m. - 5:00 p.m.) [phone] (after hours)

## 2020-08-25 LAB — FECAL OCCULT BLOOD, GUAIAC: Fecal Occult Blood: NEGATIVE

## 2020-09-03 ENCOUNTER — Non-Acute Institutional Stay (SKILLED_NURSING_FACILITY): Payer: Medicare Other | Admitting: Adult Health

## 2020-09-03 ENCOUNTER — Encounter: Payer: Self-pay | Admitting: Adult Health

## 2020-09-03 DIAGNOSIS — G40909 Epilepsy, unspecified, not intractable, without status epilepticus: Secondary | ICD-10-CM | POA: Diagnosis not present

## 2020-09-03 DIAGNOSIS — R600 Localized edema: Secondary | ICD-10-CM

## 2020-09-03 DIAGNOSIS — E1159 Type 2 diabetes mellitus with other circulatory complications: Secondary | ICD-10-CM | POA: Diagnosis not present

## 2020-09-03 DIAGNOSIS — K219 Gastro-esophageal reflux disease without esophagitis: Secondary | ICD-10-CM

## 2020-09-03 DIAGNOSIS — I152 Hypertension secondary to endocrine disorders: Secondary | ICD-10-CM

## 2020-09-03 DIAGNOSIS — E119 Type 2 diabetes mellitus without complications: Secondary | ICD-10-CM

## 2020-09-03 NOTE — Progress Notes (Signed)
Location:  [place] Room Number: 203-A Place of Service:  SNF (31) Provider:  Durenda Age, DNP, FNP-BC  Patient Care Team: Hendricks Limes, MD as PCP - General (Internal Medicine) Medina-Vargas, Senaida Lange, NP as Nurse Practitioner (Internal Medicine)  Extended Emergency Contact Information Primary Emergency Contact: Melodye, Swor Home Phone: [phone] Relation: Sister Secondary Emergency Contact: Sandria Manly Mobile Phone: [phone] Relation: Niece  Code Status:  FULL CODE  Goals of care: Advanced Directive information Advanced Directives 05/22/2020  Does Patient Have a Medical Advance Directive? No  Type of Advance Directive -  Does patient want to make changes to medical advance directive? -  Would patient like information on creating a medical advance directive? No - Patient declined  Pre-existing out of facility DNR order (yellow form or pink MOST form) -     Chief Complaint  Patient presents with  . Medical Management of Chronic Issues    Routine [facility] visit     HPI:  Mariah [name] is a 70 y.o. female seen today for medical management of chronic diseases.  She is a long-term care resident of [facility].  She has a PMH of type 2 diabetes mellitus, essential hypertension, NASH and a history of pituitary microadenoma.  She participates in restorative programs such as 1) walking up to 15 feet x 2 trials, 2) transfers by moving from one surface to another, standing at sink while dressing, and 3) AROM BUE by doing arm bike for 5 to 10 minutes. SBPs ranging from 113 to 144, with outlier 156 and 157. She takes metoprolol tartrate 50 mg 1 tab twice a day, hydralazine and losartan 100 mg daily for hypertension. CBGs ranging from 92-187.  She takes Metformin 500 mg twice daily for diabetes mellitus.   Past Medical History:  Diagnosis Date  . Allergic rhinitis   . Arthritis    "ankles, feet, knees" (10/30/2017)  .  Asthma   . Bursitis   . Carpal tunnel syndrome   . GERD (gastroesophageal reflux disease)   . HTN (hypertension)   . Migraine    "a couple/yr" (10/30/2017)  . Obesity   . Peripheral vascular disease ([place])   . Pneumonia 1960s X 1  . Sciatica   . Tendinitis   . Type II diabetes mellitus (HCC)    Type II  . UTI (urinary tract infection)    Past Surgical History:  Procedure Laterality Date  . CRANIOTOMY N/A 01/19/2018   Procedure: ENDOSCOPIC TRANSPHENOIDAL RESECTION OF PITUITARY TUMOR;  Surgeon: Consuella Lose, MD;  Location: [place];  Service: Neurosurgery;  Laterality: N/A;  ENDOSCOPIC TRANSPHENOIDAL RESECTION OF PITUITARY TUMOR  . GANGLION CYST EXCISION Left   . KNEE ARTHROSCOPY Left 2003  . PLANTAR FASCIA RELEASE Right 1995   "heel"  . SINUS ENDO WITH FUSION N/A 01/19/2018   Procedure: SINUS ENDO WITH FUSION;  Surgeon: Jerrell Belfast, MD;  Location: [place];  Service: ENT;  Laterality: N/A;  . TONSILLECTOMY  1958  . TRANSPHENOIDAL APPROACH EXPOSURE N/A 01/19/2018   Procedure: TRANSPHENOIDAL  RESECTION OF PITUITARY TUMOR;  Surgeon: Jerrell Belfast, MD;  Location: [place];  Service: ENT;  Laterality: N/A;  . [place]; 1997   Dr [name]    Allergies  Allergen Reactions  . Penicillins Hives and Other (See Comments)    ++ got keflex in 2013 and 2019++ Has patient had a PCN reaction causing immediate rash, facial/tongue/throat swelling, SOB or lightheadedness with hypotension: No Has patient had  a PCN reaction causing severe rash involving mucus membranes or skin necrosis: No PATIENT HAS HAD A PCN REACTION THAT REQUIRED HOSPITALIZATION:  #  #  YES  #  #  Has patient had a PCN reaction occurring within the last 10 years: No   . Naproxen     History of GI intolerance with indomethacin.  She is on PPI for GERD.  NSAIDs should not be used long-term or as maintenance medication due to the comorbidities and age as per [place] List  . Codeine Nausea And Vomiting and Other  (See Comments)  . Indomethacin Diarrhea    Outpatient Encounter Medications as of 09/03/2020  Medication Sig  . acetaminophen (TYLENOL) 650 MG CR tablet Take 650 mg by mouth every 6 (six) hours as needed for pain.  [name] ADVAIR DISKUS 100-50 MCG/DOSE AEPB Inhale 1 puff into the lungs 2 (two) times daily.  [name] albuterol (PROVENTIL HFA;VENTOLIN HFA) 108 (90 Base) MCG/ACT inhaler Inhale 2 puffs into the lungs every 6 (six) hours as needed for wheezing or shortness of breath.  . bisacodyl (DULCOLAX) 10 MG suppository Constipation (2 of 4): If not relieved by MOM, give 10 mg Bisacodyl suppositiory rectally X 1 dose in 24 hours as needed (Do not use constipation standing orders for residents with renal failure/CFR less than 30. Contact MD for orders)  . cetaphil (CETAPHIL) cream Apply 1 application topically at bedtime. Apply to bilateral lower legs for dry skin.  . Cholecalciferol (VITAMIN D3) 125 MCG (5000 UT) CAPS Take 1 capsule by mouth daily.   [name] esomeprazole (NEXIUM) 20 MG capsule Take 1 capsule (20 mg total) by mouth every morning. For acid reflex  . furosemide (LASIX) 40 MG tablet TAKE ONE TABLET BY MOUTH EVERY DAY  . hydrALAZINE (APRESOLINE) 25 MG tablet Take 25 mg by mouth 3 (three) times daily.   [name] levETIRAcetam (KEPPRA) 500 MG tablet Take 1 tablet (500 mg total) by mouth 2 (two) times daily.  [name] loperamide (IMODIUM A-D) 2 MG tablet Take 2 mg by mouth as needed for diarrhea or loose stools. Give after each stool prn, not to exceed 8 mg in 24 hours  . losartan (COZAAR) 100 MG tablet Take 100 mg by mouth daily.  . magnesium hydroxide (MILK OF MAGNESIA) 400 MG/5ML suspension Constipation (1 of 4): If no BM in 3 days, give 30 cc Milk of Magnesium p.o. x 1 dose in 24 hours as needed (Do not use standing constipation orders for residents with renal failure CFR less than 30. Contact MD for orders)  . Menthol, Topical Analgesic, 4 % GEL Apply 1 application topically 3 (three) times daily. Apply to bilateral  knees for osteoarthritis  . metFORMIN (GLUCOPHAGE) 500 MG tablet Take 500 mg by mouth 2 (two) times daily with a meal.  . metoprolol succinate (TOPROL-XL) 25 MG 24 hr tablet Take 50 mg by mouth 2 (two) times daily.   . mometasone (NASONEX) 50 MCG/ACT nasal spray Place 1 spray into the nose as needed.  . promethazine (PHENERGAN) 25 MG tablet Take 25 mg by mouth every 6 (six) hours as needed for nausea or vomiting. x3 doses  . protective barrier (RESTORE) CREA Apply topically. Apply barrier cream to buttocks topically BID PRN  . Sodium Fluoride (DENTA 5000 PLUS DT) Place 1 application onto teeth every evening.   No facility-administered encounter medications on file as of 09/03/2020.    Review of Systems  GENERAL: No change in appetite, no fatigue, no fever, chills or weakness MOUTH  and THROAT: Denies oral discomfort, gingival pain or bleeding, pain from teeth or hoarseness   RESPIRATORY: no cough, SOB, DOE, wheezing, hemoptysis CARDIAC: No chest pain, edema or palpitations GI: No abdominal pain, diarrhea, constipation, heart burn, nausea or vomiting GU: Denies dysuria, frequency, hematuria or discharge NEUROLOGICAL: Denies dizziness, syncope, numbness, or headache PSYCHIATRIC: Denies feelings of depression or anxiety. No report of hallucinations, insomnia, paranoia, or agitation   Immunization History  Administered Date(s) Administered  . Influenza Split 03/08/2013  . Influenza Whole 09/03/2009, 06/03/2010  . Influenza,inj,Quad PF,6+ Mos 06/25/2014  . Influenza-Unspecified 03/25/2018, 05/06/2019, 05/14/2020  . Moderna Sars-Covid-2 Vaccination 08/12/2019, 09/09/2019  . Pneumococcal Polysaccharide-23 07/25/2002, 10/31/2017  . Pneumococcal-Unspecified 05/07/2019  . Td 07/25/2005  . Tdap 05/03/2019   Pertinent  Health Maintenance Due  Topic Date Due  . COLONOSCOPY (Pts 45-80yrs Insurance coverage will need to be confirmed)  Never done  . OPHTHALMOLOGY EXAM  09/08/2019  . PNA vac Low  Risk Adult (2 of 2 - PCV13) 05/06/2020  . FOOT EXAM  05/23/2020  . HEMOGLOBIN A1C  07/07/2020  . MAMMOGRAM  03/03/2021 (Originally 01/14/2017)  . DEXA SCAN  03/03/2021 (Originally 09/02/2017)  . INFLUENZA VACCINE  Completed   Fall Risk  05/22/2020 02/21/2019 03/06/2018 10/20/2017 09/01/2017  Falls in the past year? 1 (No Data) Yes Yes Yes  Comment - Emmi Telephone Survey: data to providers prior to load - - -  Number falls in past yr: 1 (No Data) 1 2 or more 2 or more  Comment - Emmi Telephone Survey Actual Response =  - - 7 times   Injury with Fall? 0 - Yes Yes No  Comment - - - Broken jaw -  Risk Factor Category  - - High Fall Risk High Fall Risk High Fall Risk  Risk for fall due to : History of fall(s);Impaired balance/gait;Impaired mobility - Impaired balance/gait History of fall(s);Impaired mobility;Impaired balance/gait Impaired balance/gait;Impaired mobility  Follow up Falls evaluation completed;Education provided;Falls prevention discussed - Falls prevention discussed - Falls prevention discussed     Vitals:   09/03/20 1638  BP: (!) 156/83  Pulse: 78  Resp: 17  Temp: (!) 97.2 F (36.2 C)  TempSrc: Oral  Weight: 195 lb 3.2 oz (88.5 kg)  Height: 5\' 1"  (1.549 m)   Body mass index is 36.88 kg/m.  Physical Exam  GENERAL APPEARANCE: Well nourished. In no acute distress.  Obese  SKIN:  Skin is warm and dry.  MOUTH and THROAT: Lips are without lesions. Oral mucosa is moist and without lesions. Tongue is normal in shape, size, and color and without lesions RESPIRATORY: Breathing is even & unlabored, BS CTAB CARDIAC: RRR, no murmur,no extra heart sounds, no edema GI: Abdomen soft, normal BS, no masses, no tenderness NEUROLOGICAL: There is no tremor. Speech is clear.  Alert to self, disoriented to time and place. PSYCHIATRIC:  Affect and behavior are appropriate  Labs reviewed: Recent Labs    09/10/19 0000 01/01/20 0000  NA 139 139  K 4.0 3.6  CL 103 101  CO2 25* 25*  BUN  13 13  CREATININE 1.5* 1.2*  CALCIUM 9.3 9.6   Recent Labs    09/10/19 0000  AST 13  ALT 10  ALKPHOS 104  ALBUMIN 3.6   Recent Labs    09/10/19 0000 01/01/20 0000  WBC 10.9 9.3  NEUTROABS 8 6  HGB 10.9* 10.1*  HCT 33* 31*  PLT 428* 389   Lab Results  Component Value Date   TSH 2.34  01/01/2020   Lab Results  Component Value Date   HGBA1C 6.2 04/07/2020   Lab Results  Component Value Date   CHOL 178 08/30/2012   HDL 49 08/30/2012   LDLCALC 92 08/30/2012   TRIG 187 (H) 08/30/2012   CHOLHDL 3.6 08/30/2012    Assessment/Plan  1. Hypertension associated with diabetes ([place]) -  BPs stable, continue metoprolol tartrate, losartan and hydralazine  2. Lower extremity edema -   Due to PVD -   Continue furosemide  3. Type 2 diabetes mellitus treated without insulin (HCC) Lab Results  Component Value Date   HGBA1C 6.2 04/07/2020   -    Continue Metformin and CBG checks  4. Seizure disorder ([place]) -   No recent seizures -   Continue Keppra  5. Gastroesophageal reflux disease without esophagitis -Stable, continue esomeprazole     Family/ staff Communication: Discussed plan of care with resident and charge nurse.  Labs/tests ordered: None  Goals of care:   Long-term care   Durenda Age, DNP, MSN, FNP-BC [facility] [phone] (Monday-Friday 8:00 a.m. - 5:00 p.m.) [phone] (after hours)

## 2020-09-29 ENCOUNTER — Encounter: Payer: Self-pay | Admitting: Internal Medicine

## 2020-09-29 ENCOUNTER — Non-Acute Institutional Stay (SKILLED_NURSING_FACILITY): Payer: Medicare Other | Admitting: Internal Medicine

## 2020-09-29 DIAGNOSIS — D649 Anemia, unspecified: Secondary | ICD-10-CM

## 2020-09-29 DIAGNOSIS — E1169 Type 2 diabetes mellitus with other specified complication: Secondary | ICD-10-CM

## 2020-09-29 DIAGNOSIS — D352 Benign neoplasm of pituitary gland: Secondary | ICD-10-CM

## 2020-09-29 DIAGNOSIS — E1159 Type 2 diabetes mellitus with other circulatory complications: Secondary | ICD-10-CM | POA: Diagnosis not present

## 2020-09-29 DIAGNOSIS — N1831 Chronic kidney disease, stage 3a: Secondary | ICD-10-CM

## 2020-09-29 DIAGNOSIS — I739 Peripheral vascular disease, unspecified: Secondary | ICD-10-CM

## 2020-09-29 DIAGNOSIS — I152 Hypertension secondary to endocrine disorders: Secondary | ICD-10-CM

## 2020-09-29 NOTE — Assessment & Plan Note (Signed)
Tense, nonpitting edema persist despite 40 mg of furosemide daily.BMET will be updated because of CKD. Note: She is not on amlodipine.

## 2020-09-29 NOTE — Assessment & Plan Note (Signed)
BP controlled; no change in antihypertensive medications  

## 2020-09-29 NOTE — Assessment & Plan Note (Signed)
Update CBC. 

## 2020-09-29 NOTE — Progress Notes (Signed)
° °  NURSING HOME LOCATION:  [facility]  ROOM NUMBER:  203/A  CODE STATUS:  Full Code  PCP: Hendricks Limes, MD   This is a nursing facility follow up visit of chronic medical diagnoses & to document compliance with Regulation 483.30 (c) in [employer] Manual Phase 2 which mandates caregiver visit ( visits can alternate among physician, PA or NP as per statutes) within 10 days of 30 days / 60 days/ 90 days post admission to SNF date    Interim medical record and care since last [place] visit was updated with review of diagnostic studies and change in clinical status since last visit were documented.  HPI: She is a permanent resident of this facility with medical diagnoses of diabetes with neurologic complications; PVD; history of migraine; essential hypertension; GERD; and neurocognitive deficits. Surgeries and procedures include transsphenoidal resection of pituitary tumor and uterine fibroid surgery.  Review of systems: Dementia invalidated responses.  She can provide no meaningful history.  Her stories were rambling and convoluted.  She kept referring to buying a new home versus staying in her condo.  She also was questioning whether to sell her [employer] Lei to get a new car.  The only physical symptoms she mentioned was "a tooth messing up" without clarification.  She did mention that she was having trouble going to the bathroom but could not remember "constipation".  Physical exam:  Pertinent or positive findings: NP student checked field of vision and these were normal.  Eyebrows are decreased laterally.  She has low-grade scattered rhonchi.  There is tense nonpitting edema of the lower extremities.  Pedal pulses are decreased and essentially not palpable.  She ambulates using a walker with a broad-based, unsteady gait.  General appearance: Adequately nourished; no acute distress, increased work of breathing is present.   Lymphatic: No lymphadenopathy  about the head, neck, axilla. Eyes: No conjunctival inflammation or lid edema is present. There is no scleral icterus. Ears:  External ear exam shows no significant lesions or deformities.   Nose:  External nasal examination shows no deformity or inflammation. Nasal mucosa are pink and moist without lesions, exudates Oral exam:  Lips and gums are healthy appearing. There is no oropharyngeal erythema or exudate. Neck:  No thyromegaly, masses, tenderness noted.    Heart:  Normal rate and regular rhythm. S1 and S2 normal without gallop, murmur, click, rub .  Lungs:  without wheezes, rales, rubs. Abdomen: Bowel sounds are normal. Abdomen is soft and nontender with no organomegaly, hernias, masses. GU: Deferred  Extremities:  No cyanosis, clubbing  Neurologic exam :Balance, Rhomberg, finger to nose testing could not be completed due to clinical state Skin: Warm & dry w/o tenting. No significant  rash.  See summary under each active problem in the Problem List with associated updated therapeutic plan

## 2020-09-29 NOTE — Assessment & Plan Note (Signed)
Most recent BMET revealed creatinine of 1.2 with GFR 35 indicating CKD stage IIIa.  Avoid nephrotoxic medications.  It is noted that she is on Metformin; but at low dose.

## 2020-09-29 NOTE — Assessment & Plan Note (Addendum)
Most recent TSH was therapeutic. FOV intact to confrontation. Endocrine F/U with Dr Cruzita Lederer

## 2020-09-29 NOTE — Patient Instructions (Signed)
See assessment and plan under each diagnosis in the problem list and acutely for this visit 

## 2020-09-30 LAB — BASIC METABOLIC PANEL
BUN: 18 (ref 4–21)
CO2: 25 — AB (ref 13–22)
Chloride: 99 (ref 99–108)
Creatinine: 1.5 — AB (ref 0.5–1.1)
Glucose: 81
Potassium: 4.2 (ref 3.4–5.3)
Sodium: 139 (ref 137–147)

## 2020-09-30 LAB — TSH: TSH: 4.22 (ref 0.41–5.90)

## 2020-09-30 LAB — COMPREHENSIVE METABOLIC PANEL
Calcium: 9.7 (ref 8.7–10.7)
GFR calc Af Amer: 40.2
GFR calc non Af Amer: 34.68

## 2020-09-30 LAB — CBC: RBC: 3.92 (ref 3.87–5.11)

## 2020-09-30 LAB — CBC AND DIFFERENTIAL
HCT: 33 — AB (ref 36–46)
Hemoglobin: 10.6 — AB (ref 12.0–16.0)
Platelets: 461 — AB (ref 150–399)
WBC: 9.5

## 2020-10-07 ENCOUNTER — Encounter: Payer: Self-pay | Admitting: Adult Health

## 2020-10-07 ENCOUNTER — Non-Acute Institutional Stay (SKILLED_NURSING_FACILITY): Payer: Medicare Other | Admitting: Adult Health

## 2020-10-07 DIAGNOSIS — Z7189 Other specified counseling: Secondary | ICD-10-CM

## 2020-10-07 DIAGNOSIS — G40909 Epilepsy, unspecified, not intractable, without status epilepticus: Secondary | ICD-10-CM | POA: Diagnosis not present

## 2020-10-07 DIAGNOSIS — E1169 Type 2 diabetes mellitus with other specified complication: Secondary | ICD-10-CM | POA: Diagnosis not present

## 2020-10-07 DIAGNOSIS — I152 Hypertension secondary to endocrine disorders: Secondary | ICD-10-CM

## 2020-10-07 DIAGNOSIS — E1159 Type 2 diabetes mellitus with other circulatory complications: Secondary | ICD-10-CM

## 2020-10-07 DIAGNOSIS — J45909 Unspecified asthma, uncomplicated: Secondary | ICD-10-CM

## 2020-10-07 NOTE — Progress Notes (Signed)
Location:  [place] Room Number: 203-A Place of Service:  SNF (31) Provider:  Durenda Age, DNP, FNP-BC  Patient Care Team: Hendricks Limes, MD as PCP - General (Internal Medicine) Medina-Vargas, Senaida Lange, NP as Nurse Practitioner (Internal Medicine)  Extended Emergency Contact Information Primary Emergency Contact: Viktoria, [place] Home Phone: [phone] Relation: Sister Secondary Emergency Contact: Sandria Manly Mobile Phone: [phone] Relation: Niece  Code Status: Full Code   Goals of care: Advanced Directive information Advanced Directives 10/07/2020  Does Patient Have a Medical Advance Directive? No  Type of Advance Directive -  Does patient want to make changes to medical advance directive? -  Would patient like information on creating a medical advance directive? No - Patient declined  Pre-existing out of facility DNR order (yellow form or pink MOST form) -     Chief Complaint  Patient presents with  . Acute Visit    Care Plan Meeting     HPI:  Pt is a 70 y.o. female who had a care plan meeting today. She is a long-term care resident of [facility]. She has a PMH of diabetes mellitus, essential hypertension, NASH and history of pituitary microadenoma. The meeting was attended by resident, Education officer, museum, NP, life enrichment and MDS coordinator. Resident remains to be full code. Discussed medications, vital signs and weights. Latest weight is 196.2, stable for five months. She is able to make her needs known to staff.  She replied to all inquiries.  Past Medical History:  Diagnosis Date  . Allergic rhinitis   . Arthritis    "ankles, feet, knees" (10/30/2017)  . Asthma   . Bursitis   . Carpal tunnel syndrome   . GERD (gastroesophageal reflux disease)   . HTN (hypertension)   . Migraine    "a couple/yr" (10/30/2017)  . Obesity   . Peripheral vascular disease ([place])   . Pneumonia 1960s X 1  . Sciatica    . Tendinitis   . Type II diabetes mellitus (HCC)    Type II  . UTI (urinary tract infection)    Past Surgical History:  Procedure Laterality Date  . CRANIOTOMY N/A 01/19/2018   Procedure: ENDOSCOPIC TRANSPHENOIDAL RESECTION OF PITUITARY TUMOR;  Surgeon: Consuella Lose, MD;  Location: [place];  Service: Neurosurgery;  Laterality: N/A;  ENDOSCOPIC TRANSPHENOIDAL RESECTION OF PITUITARY TUMOR  . GANGLION CYST EXCISION Left   . KNEE ARTHROSCOPY Left 2003  . PLANTAR FASCIA RELEASE Right 1995   "heel"  . SINUS ENDO WITH FUSION N/A 01/19/2018   Procedure: SINUS ENDO WITH FUSION;  Surgeon: Jerrell Belfast, MD;  Location: [place];  Service: ENT;  Laterality: N/A;  . TONSILLECTOMY  1958  . TRANSPHENOIDAL APPROACH EXPOSURE N/A 01/19/2018   Procedure: TRANSPHENOIDAL  RESECTION OF PITUITARY TUMOR;  Surgeon: Jerrell Belfast, MD;  Location: [place];  Service: ENT;  Laterality: N/A;  . [place]; 1997   Dr [name]    Allergies  Allergen Reactions  . Penicillins Hives and Other (See Comments)    ++ got keflex in 2013 and 2019++ Has patient had a PCN reaction causing immediate rash, facial/tongue/throat swelling, SOB or lightheadedness with hypotension: No Has patient had a PCN reaction causing severe rash involving mucus membranes or skin necrosis: No PATIENT HAS HAD A PCN REACTION THAT REQUIRED HOSPITALIZATION:  #  #  YES  #  #  Has patient had a PCN reaction occurring within the last 10 years: No   . Naproxen  History of GI intolerance with indomethacin.  She is on PPI for GERD.  NSAIDs should not be used long-term or as maintenance medication due to the comorbidities and age as per Beers' List  . Codeine Nausea And Vomiting and Other (See Comments)  . Indomethacin Diarrhea    Outpatient Encounter Medications as of 10/07/2020  Medication Sig  . acetaminophen (TYLENOL) 650 MG CR tablet Take 650 mg by mouth every 6 (six) hours as needed for pain.  [name] ADVAIR DISKUS 100-50  MCG/DOSE AEPB Inhale 1 puff into the lungs 2 (two) times daily.  [name] albuterol (PROVENTIL HFA;VENTOLIN HFA) 108 (90 Base) MCG/ACT inhaler Inhale 2 puffs into the lungs every 6 (six) hours as needed for wheezing or shortness of breath.  . bisacodyl (DULCOLAX) 10 MG suppository Constipation (2 of 4): If not relieved by MOM, give 10 mg Bisacodyl suppositiory rectally X 1 dose in 24 hours as needed (Do not use constipation standing orders for residents with renal failure/CFR less than 30. Contact MD for orders)  . cetaphil (CETAPHIL) cream Apply 1 application topically at bedtime. Apply to bilateral lower legs for dry skin.  . Cholecalciferol (VITAMIN D3) 125 MCG (5000 UT) CAPS Take 1 capsule by mouth daily.   [name] esomeprazole (NEXIUM) 20 MG capsule Take 1 capsule (20 mg total) by mouth every morning. For acid reflex  . furosemide (LASIX) 40 MG tablet TAKE ONE TABLET BY MOUTH EVERY DAY  . hydrALAZINE (APRESOLINE) 25 MG tablet Take 25 mg by mouth 3 (three) times daily.   [name] levETIRAcetam (KEPPRA) 500 MG tablet Take 1 tablet (500 mg total) by mouth 2 (two) times daily.  [name] loperamide (IMODIUM A-D) 2 MG tablet Take 2 mg by mouth as needed for diarrhea or loose stools. Give after each stool prn, not to exceed 8 mg in 24 hours  . losartan (COZAAR) 100 MG tablet Take 100 mg by mouth daily.  . magnesium hydroxide (MILK OF MAGNESIA) 400 MG/5ML suspension Constipation (1 of 4): If no BM in 3 days, give 30 cc Milk of Magnesium p.o. x 1 dose in 24 hours as needed (Do not use standing constipation orders for residents with renal failure CFR less than 30. Contact MD for orders)  . metFORMIN (GLUCOPHAGE) 500 MG tablet Take 500 mg by mouth 2 (two) times daily with a meal.  . metoprolol succinate (TOPROL-XL) 25 MG 24 hr tablet Take 50 mg by mouth 2 (two) times daily.   . mometasone (NASONEX) 50 MCG/ACT nasal spray Place 1 spray into the nose as needed.  . promethazine (PHENERGAN) 25 MG tablet Take 25 mg by mouth every 6 (six)  hours as needed for nausea or vomiting. X 3 doses notify MD id symptoms persist more than 24 hours  . protective barrier (RESTORE) CREA Apply topically. Apply barrier cream to buttocks topically BID PRN  . Sodium Fluoride (DENTA 5000 PLUS DT) Place 1 application onto teeth every evening.   No facility-administered encounter medications on file as of 10/07/2020.    Review of Systems  GENERAL: No change in appetite, no fatigue, no weight changes, no fever, chills or weakness SKIN: Denies rash, itching, wounds, ulcer sores, or nail abnormalities MOUTH and THROAT: Denies oral discomfort, gingival pain or bleeding, pain from teeth or hoarseness   RESPIRATORY: no cough, SOB, DOE, wheezing, hemoptysis CARDIAC: No chest pain, edema or palpitations GI: No abdominal pain, diarrhea, constipation, heart burn, nausea or vomiting GU: Denies dysuria, frequency, hematuria or discharge NEUROLOGICAL: Denies dizziness, syncope, numbness,  or headache PSYCHIATRIC: Denies feelings of depression or anxiety. No report of hallucinations, insomnia, paranoia, or agitation   Immunization History  Administered Date(s) Administered  . Influenza Split 03/08/2013  . Influenza Whole 09/03/2009, 06/03/2010  . Influenza,inj,Quad PF,6+ Mos 06/25/2014  . Influenza-Unspecified 03/25/2018, 05/06/2019, 05/14/2020  . Moderna Sars-Covid-2 Vaccination 08/12/2019, 09/09/2019  . Pneumococcal Polysaccharide-23 07/25/2002, 10/31/2017  . Pneumococcal-Unspecified 05/07/2019  . Td 07/25/2005  . Tdap 05/03/2019  . Unspecified SARS-COV-2 Vaccination 07/28/2020   Pertinent  Health Maintenance Due  Topic Date Due  . OPHTHALMOLOGY EXAM  09/08/2019  . PNA vac Low Risk Adult (2 of 2 - PCV13) 05/06/2020  . HEMOGLOBIN A1C  07/07/2020  . MAMMOGRAM  03/03/2021 (Originally 01/14/2017)  . DEXA SCAN  03/03/2021 (Originally 09/02/2017)  . COLONOSCOPY (Pts 45-33yrs Insurance coverage will need to be confirmed)  08/25/2021 (Originally 09/02/1997)   . FOOT EXAM  02/18/2021  . INFLUENZA VACCINE  Completed   Fall Risk  05/22/2020 02/21/2019 03/06/2018 10/20/2017 09/01/2017  Falls in the past year? 1 (No Data) Yes Yes Yes  Comment - Emmi Telephone Survey: data to providers prior to load - - -  Number falls in past yr: 1 (No Data) 1 2 or more 2 or more  Comment - Emmi Telephone Survey Actual Response =  - - 7 times   Injury with Fall? 0 - Yes Yes No  Comment - - - Broken jaw -  Risk Factor Category  - - High Fall Risk High Fall Risk High Fall Risk  Risk for fall due to : History of fall(s);Impaired balance/gait;Impaired mobility - Impaired balance/gait History of fall(s);Impaired mobility;Impaired balance/gait Impaired balance/gait;Impaired mobility  Follow up Falls evaluation completed;Education provided;Falls prevention discussed - Falls prevention discussed - Falls prevention discussed     Vitals:   10/07/20 1054  BP: (!) 144/77  Pulse: 62  Resp: (!) 22  Temp: 98.2 F (36.8 C)  SpO2: 98%  Weight: 196 lb 9.6 oz (89.2 kg)  Height: 5\' 1"  (1.549 m)   Body mass index is 37.15 kg/m.  Physical Exam  GENERAL APPEARANCE: Well nourished. In no acute distress. Morbidly obese. SKIN:  Skin is warm and dry.  MOUTH and THROAT: Lips are without lesions. Oral mucosa is moist and without lesions. Tongue is normal in shape, size, and color and without lesions RESPIRATORY: Breathing is even & unlabored, BS CTAB CARDIAC: RRR, no murmur,no extra heart sounds, BLE 2+edema GI: Abdomen soft, normal BS, no masses, no tenderness NEUROLOGICAL: There is no tremor. Speech is clear. Alert to self, disoriented to time and place. PSYCHIATRIC:  Affect and behavior are appropriate  Labs reviewed: Recent Labs    01/01/20 0000 09/30/20 0000  NA 139 139  K 3.6 4.2  CL 101 99  CO2 25* 25*  BUN 13 18  CREATININE 1.2* 1.5*  CALCIUM 9.6 9.7    Recent Labs    01/01/20 0000 09/30/20 0000  WBC 9.3 9.5  NEUTROABS 6  --   HGB 10.1* 10.6*  HCT 31* 33*   PLT 389 461*   Lab Results  Component Value Date   TSH 4.22 09/30/2020   Lab Results  Component Value Date   HGBA1C 6.2 04/07/2020   Lab Results  Component Value Date   CHOL 178 08/30/2012   HDL 49 08/30/2012   LDLCALC 92 08/30/2012   TRIG 187 (H) 08/30/2012   CHOLHDL 3.6 08/30/2012     Assessment/Plan  1. ACP (advance care planning) -   Discussed medications, vital signs  and weights -   Remains to be full code  2. Type 2 diabetes mellitus with morbid obesity ([place]) Lab Results  Component Value Date   HGBA1C 6.2 04/07/2020   -   CBGs is stable, continue Metformin  3. Seizure disorder ([place]) -    no recent seizures, continue levetiracetam  4. Hypertension associated with diabetes ([place]) -    BPs stable, continue losartan, hydralazine and metoprolol tartrate  5. Mild asthma without complication, unspecified whether persistent -   No wheezing, continue Advair inhaler, PRN mometasone and PRN albuterol    Family/ staff Communication: Discussed plan of care with resident, niece and IDT.  Labs/tests ordered: None  Goals of care:   Long-term care    Durenda Age, DNP, MSN, FNP-BC [facility] [phone] (Monday-Friday 8:00 a.m. - 5:00 p.m.) [phone] (after hours)

## 2020-10-28 ENCOUNTER — Non-Acute Institutional Stay (SKILLED_NURSING_FACILITY): Payer: Medicare Other | Admitting: Adult Health

## 2020-10-28 ENCOUNTER — Encounter: Payer: Self-pay | Admitting: Adult Health

## 2020-10-28 DIAGNOSIS — E1169 Type 2 diabetes mellitus with other specified complication: Secondary | ICD-10-CM | POA: Diagnosis not present

## 2020-10-28 DIAGNOSIS — E1159 Type 2 diabetes mellitus with other circulatory complications: Secondary | ICD-10-CM | POA: Diagnosis not present

## 2020-10-28 DIAGNOSIS — J45909 Unspecified asthma, uncomplicated: Secondary | ICD-10-CM | POA: Diagnosis not present

## 2020-10-28 DIAGNOSIS — I152 Hypertension secondary to endocrine disorders: Secondary | ICD-10-CM

## 2020-10-28 DIAGNOSIS — G40909 Epilepsy, unspecified, not intractable, without status epilepticus: Secondary | ICD-10-CM

## 2020-10-28 NOTE — Progress Notes (Signed)
Location:  [place] Room Number: 203 A Place of Service:  SNF (31) Provider:  Durenda Age, DNP, FNP-BC  Patient Care Team: Hendricks Limes, MD as PCP - General (Internal Medicine) Medina-Vargas, Senaida Lange, NP as Nurse Practitioner (Internal Medicine)  Extended Emergency Contact Information Primary Emergency Contact: Aryianna, [place] Home Phone: [phone] Relation: Sister Secondary Emergency Contact: Sandria Manly Mobile Phone: [phone] Relation: Niece  Code Status:    FullCode  Goals of care: Advanced Directive information Advanced Directives 10/07/2020  Does Patient Have a Medical Advance Directive? No  Type of Advance Directive -  Does patient want to make changes to medical advance directive? -  Would patient like information on creating a medical advance directive? No - Patient declined  Pre-existing out of facility DNR order (yellow form or pink MOST form) -     Chief Complaint  Patient presents with  . Medical Management of Chronic Issues    Routine Visit    HPI:  Pt is a 70 y.o. female seen today for medical management of chronic diseases.  She is a long-term care resident of [facility]. He has a PMH of diabetes mellitus, essential hypertension, NASH and a history of pituitary microadenoma. She participates in restorative exercises such as 1) AROM, bike exercises for up to 5 to 10 minutes, and 2) transfers by transferring from 1 surface to another and standing in front of the sink while dressing.  No reported seizures.  She takes levetiracetam 500 mg twice a day for seizure. CBGs ranging from 89-171, with outlier 43.  She takes Metformin 500 mg twice a day for diabetes mellitus.   Past Medical History:  Diagnosis Date  . Allergic rhinitis   . Arthritis    "ankles, feet, knees" (10/30/2017)  . Asthma   . Bursitis   . Carpal tunnel syndrome   . GERD (gastroesophageal reflux disease)   . HTN  (hypertension)   . Migraine    "a couple/yr" (10/30/2017)  . Obesity   . Peripheral vascular disease ([place])   . Pneumonia 1960s X 1  . Sciatica   . Tendinitis   . Type II diabetes mellitus (HCC)    Type II  . UTI (urinary tract infection)    Past Surgical History:  Procedure Laterality Date  . CRANIOTOMY N/A 01/19/2018   Procedure: ENDOSCOPIC TRANSPHENOIDAL RESECTION OF PITUITARY TUMOR;  Surgeon: Consuella Lose, MD;  Location: [place];  Service: Neurosurgery;  Laterality: N/A;  ENDOSCOPIC TRANSPHENOIDAL RESECTION OF PITUITARY TUMOR  . GANGLION CYST EXCISION Left   . KNEE ARTHROSCOPY Left 2003  . PLANTAR FASCIA RELEASE Right 1995   "heel"  . SINUS ENDO WITH FUSION N/A 01/19/2018   Procedure: SINUS ENDO WITH FUSION;  Surgeon: Jerrell Belfast, MD;  Location: [place];  Service: ENT;  Laterality: N/A;  . TONSILLECTOMY  1958  . TRANSPHENOIDAL APPROACH EXPOSURE N/A 01/19/2018   Procedure: TRANSPHENOIDAL  RESECTION OF PITUITARY TUMOR;  Surgeon: Jerrell Belfast, MD;  Location: [place];  Service: ENT;  Laterality: N/A;  . [place]; 1997   Dr [name]    Allergies  Allergen Reactions  . Penicillins Hives and Other (See Comments)    ++ got keflex in 2013 and 2019++ Has patient had a PCN reaction causing immediate rash, facial/tongue/throat swelling, SOB or lightheadedness with hypotension: No Has patient had a PCN reaction causing severe rash involving mucus membranes or skin necrosis: No PATIENT HAS HAD A PCN REACTION THAT REQUIRED HOSPITALIZATION:  #  #  YES  #  #  Has patient had a PCN reaction occurring within the last 10 years: No   . Naproxen     History of GI intolerance with indomethacin.  She is on PPI for GERD.  NSAIDs should not be used long-term or as maintenance medication due to the comorbidities and age as per Beers' List  . Codeine Nausea And Vomiting and Other (See Comments)  . Indomethacin Diarrhea    Outpatient Encounter Medications as of 10/28/2020   Medication Sig  . acetaminophen (TYLENOL) 650 MG CR tablet Take 650 mg by mouth every 6 (six) hours as needed for pain.  [name] ADVAIR DISKUS 100-50 MCG/DOSE AEPB Inhale 1 puff into the lungs 2 (two) times daily.  [name] albuterol (PROVENTIL HFA;VENTOLIN HFA) 108 (90 Base) MCG/ACT inhaler Inhale 2 puffs into the lungs every 6 (six) hours as needed for wheezing or shortness of breath.  . bisacodyl (DULCOLAX) 10 MG suppository Constipation (2 of 4): If not relieved by MOM, give 10 mg Bisacodyl suppositiory rectally X 1 dose in 24 hours as needed (Do not use constipation standing orders for residents with renal failure/CFR less than 30. Contact MD for orders)  . cetaphil (CETAPHIL) cream Apply 1 application topically at bedtime. Apply to bilateral lower legs for dry skin.  . Cholecalciferol (VITAMIN D3) 125 MCG (5000 UT) CAPS Take 1 capsule by mouth daily.   [name] esomeprazole (NEXIUM) 20 MG capsule Take 1 capsule (20 mg total) by mouth every morning. For acid reflex  . furosemide (LASIX) 40 MG tablet TAKE ONE TABLET BY MOUTH EVERY DAY  . hydrALAZINE (APRESOLINE) 25 MG tablet Take 25 mg by mouth 3 (three) times daily.   [name] levETIRAcetam (KEPPRA) 500 MG tablet Take 1 tablet (500 mg total) by mouth 2 (two) times daily.  [name] loperamide (IMODIUM A-D) 2 MG tablet Take 2 mg by mouth as needed for diarrhea or loose stools. Give after each stool prn, not to exceed 8 mg in 24 hours  . losartan (COZAAR) 100 MG tablet Take 100 mg by mouth daily.  . magnesium hydroxide (MILK OF MAGNESIA) 400 MG/5ML suspension Constipation (1 of 4): If no BM in 3 days, give 30 cc Milk of Magnesium p.o. x 1 dose in 24 hours as needed (Do not use standing constipation orders for residents with renal failure CFR less than 30. Contact MD for orders)  . metFORMIN (GLUCOPHAGE) 500 MG tablet Take 500 mg by mouth 2 (two) times daily with a meal.  . metoprolol succinate (TOPROL-XL) 25 MG 24 hr tablet Take 50 mg by mouth 2 (two) times daily.   . mometasone  (NASONEX) 50 MCG/ACT nasal spray Place 1 spray into the nose as needed.  . promethazine (PHENERGAN) 25 MG tablet Take 25 mg by mouth every 6 (six) hours as needed for nausea or vomiting. X 3 doses notify MD id symptoms persist more than 24 hours  . protective barrier (RESTORE) CREA Apply topically. Apply barrier cream to buttocks topically BID PRN  . Sodium Fluoride (DENTA 5000 PLUS DT) Place 1 application onto teeth every evening.   No facility-administered encounter medications on file as of 10/28/2020.    Review of Systems  GENERAL: No change in appetite, no fever or chills  MOUTH and THROAT: Denies oral discomfort, gingival pain or bleeding, pain from teeth or hoarseness   RESPIRATORY: no cough, SOB, DOE, wheezing, hemoptysis CARDIAC: No chest pain or palpitations GI: No abdominal pain, diarrhea, constipation, heart burn, nausea or vomiting GU:  Denies dysuria, frequency, hematuria or discharge NEUROLOGICAL: Denies dizziness, syncope, numbness, or headache PSYCHIATRIC: Denies feelings of depression or anxiety. No report of hallucinations, insomnia, paranoia, or agitation   Immunization History  Administered Date(s) Administered  . Influenza Split 03/08/2013  . Influenza Whole 09/03/2009, 06/03/2010  . Influenza,inj,Quad PF,6+ Mos 06/25/2014  . Influenza-Unspecified 03/25/2018, 05/06/2019, 05/14/2020  . Moderna Sars-Covid-2 Vaccination 08/12/2019, 09/09/2019  . Pneumococcal Polysaccharide-23 07/25/2002, 10/31/2017  . Pneumococcal-Unspecified 05/07/2019  . Td 07/25/2005  . Tdap 05/03/2019  . Unspecified SARS-COV-2 Vaccination 07/28/2020   Pertinent  Health Maintenance Due  Topic Date Due  . OPHTHALMOLOGY EXAM  09/08/2019  . PNA vac Low Risk Adult (2 of 2 - PCV13) 05/06/2020  . HEMOGLOBIN A1C  07/07/2020  . MAMMOGRAM  03/03/2021 (Originally 01/14/2017)  . DEXA SCAN  03/03/2021 (Originally 09/02/2017)  . COLONOSCOPY (Pts 45-90yrs Insurance coverage will need to be confirmed)   08/25/2021 (Originally 09/02/1997)  . FOOT EXAM  02/18/2021  . INFLUENZA VACCINE  02/22/2021   Fall Risk  05/22/2020 02/21/2019 03/06/2018 10/20/2017 09/01/2017  Falls in the past year? 1 (No Data) Yes Yes Yes  Comment - Emmi Telephone Survey: data to providers prior to load - - -  Number falls in past yr: 1 (No Data) 1 2 or more 2 or more  Comment - Emmi Telephone Survey Actual Response =  - - 7 times   Injury with Fall? 0 - Yes Yes No  Comment - - - Broken jaw -  Risk Factor Category  - - High Fall Risk High Fall Risk High Fall Risk  Risk for fall due to : History of fall(s);Impaired balance/gait;Impaired mobility - Impaired balance/gait History of fall(s);Impaired mobility;Impaired balance/gait Impaired balance/gait;Impaired mobility  Follow up Falls evaluation completed;Education provided;Falls prevention discussed - Falls prevention discussed - Falls prevention discussed     Vitals:   10/28/20 1000  BP: 132/68  Pulse: 87  Resp: 16  Temp: (!) 97.5 F (36.4 C)  Weight: 194 lb 12.8 oz (88.4 kg)  Height: 5\' 1"  (1.549 m)   Body mass index is 36.81 kg/m.  Physical Exam  GENERAL APPEARANCE: Well nourished. In no acute distress. Obese. SKIN:  Skin is warm and dry.  MOUTH and THROAT: Lips are without lesions. Oral mucosa is moist and without lesions.  RESPIRATORY: Breathing is even & unlabored, BS CTAB CARDIAC: RRR, no murmur,no extra heart sounds, BLE 2+ edema GI: Abdomen soft, normal BS, no masses, no tenderness EXTREMITIES:  Able to move X 4 extremities. NEUROLOGICAL: There is no tremor. Speech is clear. Alert to self, disoriented to time and place. PSYCHIATRIC:  Affect and behavior are appropriate  Labs reviewed: Recent Labs    01/01/20 0000 09/30/20 0000  NA 139 139  K 3.6 4.2  CL 101 99  CO2 25* 25*  BUN 13 18  CREATININE 1.2* 1.5*  CALCIUM 9.6 9.7    Recent Labs    01/01/20 0000 09/30/20 0000  WBC 9.3 9.5  NEUTROABS 6  --   HGB 10.1* 10.6*  HCT 31* 33*  PLT  389 461*   Lab Results  Component Value Date   TSH 4.22 09/30/2020   Lab Results  Component Value Date   HGBA1C 6.2 04/07/2020   Lab Results  Component Value Date   CHOL 178 08/30/2012   HDL 49 08/30/2012   LDLCALC 92 08/30/2012   TRIG 187 (H) 08/30/2012   CHOLHDL 3.6 08/30/2012     Assessment/Plan  1. Seizure disorder ([place]) -  No reported seizures -   Continue levetiracetam  2. Type 2 diabetes mellitus with morbid obesity ([place]) Lab Results  Component Value Date   HGBA1C 6.2 04/07/2020   -   Continue Metformin and CBG checks  3. Mild asthma without complication, unspecified whether persistent -  No wheezing nor SOB -  Continue PRN albuterol and PRN mometasone nasal spray  4. Hypertension associated with diabetes ([place]) - SBPs stable, continue losartan, metoprolol tartrate and hydralazine   Family/ staff Communication: Discussed plan of care with resident and charge nurse.  Labs/tests ordered: None  Goals of care:   Long-term care   Durenda Age, DNP, MSN, FNP-BC [facility] [phone] (Monday-Friday 8:00 a.m. - 5:00 p.m.) [phone] (after hours)

## 2020-11-10 LAB — HEMOGLOBIN A1C: Hemoglobin A1C: 6

## 2020-12-02 ENCOUNTER — Non-Acute Institutional Stay (SKILLED_NURSING_FACILITY): Payer: Medicare Other | Admitting: Adult Health

## 2020-12-02 ENCOUNTER — Encounter: Payer: Self-pay | Admitting: Adult Health

## 2020-12-02 DIAGNOSIS — E1169 Type 2 diabetes mellitus with other specified complication: Secondary | ICD-10-CM

## 2020-12-02 DIAGNOSIS — Z7189 Other specified counseling: Secondary | ICD-10-CM | POA: Diagnosis not present

## 2020-12-02 DIAGNOSIS — E1159 Type 2 diabetes mellitus with other circulatory complications: Secondary | ICD-10-CM

## 2020-12-02 DIAGNOSIS — G40909 Epilepsy, unspecified, not intractable, without status epilepticus: Secondary | ICD-10-CM

## 2020-12-02 DIAGNOSIS — J45909 Unspecified asthma, uncomplicated: Secondary | ICD-10-CM

## 2020-12-02 DIAGNOSIS — I152 Hypertension secondary to endocrine disorders: Secondary | ICD-10-CM

## 2020-12-02 NOTE — Progress Notes (Signed)
Location:  [facility] Room Number: 203-A Place of Service:  SNF (31) Provider:  Durenda Age, DNP, FNP-BC  Patient Care Team: Hendricks Limes, MD as PCP - General (Internal Medicine) Medina-Vargas, Senaida Lange, NP as Nurse Practitioner (Internal Medicine)  Extended Emergency Contact Information Primary Emergency Contact: Gwendolen, [place] Home Phone: [phone] Relation: Sister Secondary Emergency Contact: Sandria [place] Mobile Phone: [phone] Relation: Niece  Code Status:  Full Code  Goals of care: Advanced Directive information Advanced Directives 12/02/2020  Does Patient Have a Medical Advance Directive? Yes  Type of Advance Directive -  Does patient want to make changes to medical advance directive? No - Patient declined  Would patient like information on creating a medical advance directive? -  Pre-existing out of facility DNR order (yellow form or pink MOST form) -     Chief Complaint  Patient presents with  . Medical Management of Chronic Issues    Care plan meeting.     HPI:  Pt is a 70 y.o. female who had a care plan meeting attended by resident, MDS Coordinator X 2, Life enrichment, Education officer, museum and NP.Niece was called but did not answer. She remains to be full code. Discussed medications, vital signs and weights. She talked about not being in a position to move out of her condominium. Social worker will discuss her concerns with niece. Answered medical queries. The meeting lasted for 20 minutes.    Past Medical History:  Diagnosis Date  . Allergic rhinitis   . Arthritis    "ankles, feet, knees" (10/30/2017)  . Asthma   . Bursitis   . Carpal tunnel syndrome   . GERD (gastroesophageal reflux disease)   . HTN (hypertension)   . Migraine    "a couple/yr" (10/30/2017)  . Obesity   . Peripheral vascular disease ([place])   . Pneumonia 1960s X 1  . Sciatica   . Tendinitis   . Type II diabetes mellitus (HCC)    Type II  . UTI  (urinary tract infection)    Past Surgical History:  Procedure Laterality Date  . CRANIOTOMY N/A 01/19/2018   Procedure: ENDOSCOPIC TRANSPHENOIDAL RESECTION OF PITUITARY TUMOR;  Surgeon: Consuella Lose, MD;  Location: [place];  Service: Neurosurgery;  Laterality: N/A;  ENDOSCOPIC TRANSPHENOIDAL RESECTION OF PITUITARY TUMOR  . GANGLION CYST EXCISION Left   . KNEE ARTHROSCOPY Left 2003  . PLANTAR FASCIA RELEASE Right 1995   "heel"  . SINUS ENDO WITH FUSION N/A 01/19/2018   Procedure: SINUS ENDO WITH FUSION;  Surgeon: Jerrell Belfast, MD;  Location: [place];  Service: ENT;  Laterality: N/A;  . TONSILLECTOMY  1958  . TRANSPHENOIDAL APPROACH EXPOSURE N/A 01/19/2018   Procedure: TRANSPHENOIDAL  RESECTION OF PITUITARY TUMOR;  Surgeon: Jerrell Belfast, MD;  Location: [place];  Service: ENT;  Laterality: N/A;  . [place]; 1997   Dr [name]    Allergies  Allergen Reactions  . Penicillins Hives and Other (See Comments)    ++ got keflex in 2013 and 2019++ Has patient had a PCN reaction causing immediate rash, facial/tongue/throat swelling, SOB or lightheadedness with hypotension: No Has patient had a PCN reaction causing severe rash involving mucus membranes or skin necrosis: No PATIENT HAS HAD A PCN REACTION THAT REQUIRED HOSPITALIZATION:  #  #  YES  #  #  Has patient had a PCN reaction occurring within the last 10 years: No   . Naproxen     History of GI intolerance with  indomethacin.  She is on PPI for GERD.  NSAIDs should not be used long-term or as maintenance medication due to the comorbidities and age as per Beers' List  . Codeine Nausea And Vomiting and Other (See Comments)  . Indomethacin Diarrhea    Outpatient Encounter Medications as of 12/02/2020  Medication Sig  . acetaminophen (TYLENOL) 650 MG CR tablet Take 650 mg by mouth every 6 (six) hours as needed for pain.  [name] ADVAIR DISKUS 100-50 MCG/DOSE AEPB Inhale 1 puff into the lungs 2 (two) times daily.  [name] albuterol  (PROVENTIL HFA;VENTOLIN HFA) 108 (90 Base) MCG/ACT inhaler Inhale 2 puffs into the lungs every 6 (six) hours as needed for wheezing or shortness of breath.  . bisacodyl (DULCOLAX) 10 MG suppository Constipation (2 of 4): If not relieved by MOM, give 10 mg Bisacodyl suppositiory rectally X 1 dose in 24 hours as needed (Do not use constipation standing orders for residents with renal failure/CFR less than 30. Contact MD for orders)  . cetaphil (CETAPHIL) cream Apply 1 application topically at bedtime. Apply to bilateral lower legs for dry skin.  . Cholecalciferol (VITAMIN D3) 125 MCG (5000 UT) CAPS Take 1 capsule by mouth daily.   [name] esomeprazole (NEXIUM) 20 MG capsule Take 1 capsule (20 mg total) by mouth every morning. For acid reflex  . furosemide (LASIX) 40 MG tablet TAKE ONE TABLET BY MOUTH EVERY DAY  . hydrALAZINE (APRESOLINE) 25 MG tablet Take 25 mg by mouth 3 (three) times daily.   [name] levETIRAcetam (KEPPRA) 500 MG tablet Take 1 tablet (500 mg total) by mouth 2 (two) times daily.  [name] loperamide (IMODIUM A-D) 2 MG tablet Take 2 mg by mouth as needed for diarrhea or loose stools. Give after each stool prn, not to exceed 8 mg in 24 hours  . losartan (COZAAR) 100 MG tablet Take 100 mg by mouth daily.  . magnesium hydroxide (MILK OF MAGNESIA) 400 MG/5ML suspension Constipation (1 of 4): If no BM in 3 days, give 30 cc Milk of Magnesium p.o. x 1 dose in 24 hours as needed (Do not use standing constipation orders for residents with renal failure CFR less than 30. Contact MD for orders)  . metFORMIN (GLUCOPHAGE) 500 MG tablet Take 500 mg by mouth 2 (two) times daily with a meal.  . metoprolol succinate (TOPROL-XL) 25 MG 24 hr tablet Take 50 mg by mouth 2 (two) times daily.   . mometasone (NASONEX) 50 MCG/ACT nasal spray Place 1 spray into the nose as needed.  . promethazine (PHENERGAN) 25 MG tablet Take 25 mg by mouth every 6 (six) hours as needed for nausea or vomiting. X 3 doses notify MD id symptoms  persist more than 24 hours  . Sodium Fluoride (DENTA 5000 PLUS DT) Place 1 application onto teeth every evening.  . [DISCONTINUED] protective barrier (RESTORE) CREA Apply topically. Apply barrier cream to buttocks topically BID PRN   No facility-administered encounter medications on file as of 12/02/2020.    Review of Systems  GENERAL: No change in appetite, no fatigue no fever, chills or weakness MOUTH and THROAT: Denies oral discomfort, gingival pain or bleeding, pain from teeth or hoarseness   RESPIRATORY: no cough, SOB, DOE, wheezing, hemoptysis CARDIAC: No chest pain or palpitations GI: No abdominal pain, diarrhea, constipation, heart burn, nausea or vomiting GU: Denies dysuria, frequency, hematuria or discharge NEUROLOGICAL: Denies dizziness, syncope, numbness, or headache PSYCHIATRIC: Denies feelings of depression or anxiety. No report of hallucinations, insomnia, paranoia, or agitation  Immunization History  Administered Date(s) Administered  . Influenza Split 03/08/2013  . Influenza Whole 09/03/2009, 06/03/2010  . Influenza,inj,Quad PF,6+ Mos 06/25/2014  . Influenza-Unspecified 03/25/2018, 05/06/2019, 05/14/2020  . Moderna Sars-Covid-2 Vaccination 08/12/2019, 09/09/2019  . Pneumococcal Polysaccharide-23 07/25/2002, 10/31/2017  . Pneumococcal-Unspecified 05/07/2019  . Td 07/25/2005  . Tdap 05/03/2019  . Unspecified SARS-COV-2 Vaccination 07/28/2020   Pertinent  Health Maintenance Due  Topic Date Due  . OPHTHALMOLOGY EXAM  09/08/2019  . PNA vac Low Risk Adult (2 of 2 - PCV13) 05/06/2020  . HEMOGLOBIN A1C  07/07/2020  . MAMMOGRAM  03/03/2021 (Originally 01/14/2017)  . DEXA SCAN  03/03/2021 (Originally 09/02/2017)  . COLONOSCOPY (Pts 45-42yrs Insurance coverage will need to be confirmed)  08/25/2021 (Originally 09/02/1997)  . FOOT EXAM  02/18/2021  . INFLUENZA VACCINE  02/22/2021   Fall Risk  05/22/2020 02/21/2019 03/06/2018 10/20/2017 09/01/2017  Falls in the past year? 1 (No  Data) Yes Yes Yes  Comment - Emmi Telephone Survey: data to providers prior to load - - -  Number falls in past yr: 1 (No Data) 1 2 or more 2 or more  Comment - Emmi Telephone Survey Actual Response =  - - 7 times   Injury with Fall? 0 - Yes Yes No  Comment - - - Broken jaw -  Risk Factor Category  - - High Fall Risk High Fall Risk High Fall Risk  Risk for fall due to : History of fall(s);Impaired balance/gait;Impaired mobility - Impaired balance/gait History of fall(s);Impaired mobility;Impaired balance/gait Impaired balance/gait;Impaired mobility  Follow up Falls evaluation completed;Education provided;Falls prevention discussed - Falls prevention discussed - Falls prevention discussed     Vitals:   12/02/20 1625  BP: 132/63  Pulse: 87  Resp: 19  Temp: 97.9 F (36.6 C)  Height: 5\' 1"  (1.549 m)   Body mass index is 36.81 kg/m.  Physical Exam  GENERAL APPEARANCE: Well nourished. In no acute distress. Obese.SKIN:  Skin is warm and dry.  MOUTH and THROAT: Lips are without lesions. Oral mucosa is moist and without lesions. Tongue is normal in shape, size, and color and without lesions RESPIRATORY: Breathing is even & unlabored, BS CTAB CARDIAC: RRR, no murmur,no extra heart sounds, BLE 2+ edema GI: Abdomen soft, normal BS, no masses, no tenderness NEUROLOGICAL: There is no tremor. Speech is clear. Alert to self, disoriented to time and place. PSYCHIATRIC:  Affect and behavior are appropriate  Labs reviewed: Recent Labs    01/01/20 0000 09/30/20 0000  NA 139 139  K 3.6 4.2  CL 101 99  CO2 25* 25*  BUN 13 18  CREATININE 1.2* 1.5*  CALCIUM 9.6 9.7   No results for input(s): AST, ALT, ALKPHOS, BILITOT, PROT, ALBUMIN in the last 8760 hours. Recent Labs    01/01/20 0000 09/30/20 0000  WBC 9.3 9.5  NEUTROABS 6  --   HGB 10.1* 10.6*  HCT 31* 33*  PLT 389 461*   Lab Results  Component Value Date   TSH 4.22 09/30/2020   Lab Results  Component Value Date   HGBA1C 6.2  04/07/2020   Lab Results  Component Value Date   CHOL 178 08/30/2012   HDL 49 08/30/2012   LDLCALC 92 08/30/2012   TRIG 187 (H) 08/30/2012   CHOLHDL 3.6 08/30/2012     Assessment/Plan  1. ACP (advance care planning) -    Remains to be full code -    Discussed medications, vital signs and weights  2. Type 2 diabetes mellitus with  morbid obesity ([place]) Lab Results  Component Value Date   HGBA1C 6.2 04/07/2020   -   Continue metformin  3. Mild asthma without complication, unspecified whether persistent -  No wheezing -    Continue PRN albuterol inhaler and Advair disk  4. Seizure disorder ([place]) -   No recent seizures -    Continue levetiracetam  5. Hypertension associated with diabetes ([place]) -Losartan, Lasix, hydralazine, metoprolol tartrate and metoprolol tartrate     Family/ staff Communication:   Discussed plan of care with resident and IDT.  Labs/tests ordered:   None  Goals of care:   Long-term care   Durenda Age, DNP, MSN, FNP-BC [facility] [phone] (Monday-Friday 8:00 a.m. - 5:00 p.m.) [phone] (after hours)

## 2020-12-11 ENCOUNTER — Encounter: Payer: Self-pay | Admitting: Adult Health

## 2020-12-11 ENCOUNTER — Non-Acute Institutional Stay (SKILLED_NURSING_FACILITY): Payer: Medicare Other | Admitting: Adult Health

## 2020-12-11 DIAGNOSIS — G40909 Epilepsy, unspecified, not intractable, without status epilepticus: Secondary | ICD-10-CM

## 2020-12-11 DIAGNOSIS — E1159 Type 2 diabetes mellitus with other circulatory complications: Secondary | ICD-10-CM | POA: Diagnosis not present

## 2020-12-11 DIAGNOSIS — J45909 Unspecified asthma, uncomplicated: Secondary | ICD-10-CM | POA: Diagnosis not present

## 2020-12-11 DIAGNOSIS — E1169 Type 2 diabetes mellitus with other specified complication: Secondary | ICD-10-CM

## 2020-12-11 DIAGNOSIS — I152 Hypertension secondary to endocrine disorders: Secondary | ICD-10-CM

## 2020-12-11 NOTE — Progress Notes (Signed)
Location:  [place] Room Number: 203 A Place of Service:  SNF (31) Provider:  Durenda Age, DNP, FNP-BC  Patient Care Team: Hendricks Limes, MD as PCP - General (Internal Medicine) Medina-Vargas, Senaida Lange, NP as Nurse Practitioner (Internal Medicine)  Extended Emergency Contact Information Primary Emergency Contact: Najaya, [place] Home Phone: [phone] Relation: Sister Secondary Emergency Contact: Sandria [place] Mobile Phone: [phone] Relation: Niece  Code Status:  FULL CODE  Goals of care: Advanced Directive information Advanced Directives 12/11/2020  Does Patient Have a Medical Advance Directive? Yes  Type of Advance Directive -  Does patient want to make changes to medical advance directive? No - Patient declined  Would patient like information on creating a medical advance directive? -  Pre-existing out of facility DNR order (yellow form or pink MOST form) -     Chief Complaint  Patient presents with   Medical Management of Chronic Issues    Routine follow up visit.    HPI:  Pt is a 70 y.o. female seen today for medical management of chronic diseases. She is a long-term care resident of [facility]. She has a PMH of diabetes mellitus, essential hypertension, NASH and history of pituitary microadenoma.  She participates in restorative program wherein he does AROM exercises of BUE.  BIMS score 13/15, ranging in intact cognitive concerns. CBGs ranging from 81 to 171. She takes metformin 500 mg twice a day for diabetes mellitus.  Latest A1c 6.0, 11/10/2020.   Past Medical History:  Diagnosis Date   Allergic rhinitis    Arthritis    "ankles, feet, knees" (10/30/2017)   Asthma    Bursitis    Carpal tunnel syndrome    GERD (gastroesophageal reflux disease)    HTN (hypertension)    Migraine    "a couple/yr" (10/30/2017)   Obesity    Peripheral vascular disease ([place])    Pneumonia 1960s X 1   Sciatica     Tendinitis    Type II diabetes mellitus ([place])    Type II   UTI (urinary tract infection)    Past Surgical History:  Procedure Laterality Date   CRANIOTOMY N/A 01/19/2018   Procedure: ENDOSCOPIC TRANSPHENOIDAL RESECTION OF PITUITARY TUMOR;  Surgeon: Consuella Lose, MD;  Location: [place];  Service: Neurosurgery;  Laterality: N/A;  ENDOSCOPIC TRANSPHENOIDAL RESECTION OF PITUITARY TUMOR   GANGLION CYST EXCISION Left    KNEE ARTHROSCOPY Left 2003   PLANTAR FASCIA RELEASE Right 1995   "heel"   SINUS ENDO WITH FUSION N/A 01/19/2018   Procedure: SINUS ENDO WITH FUSION;  Surgeon: Jerrell Belfast, MD;  Location: [place];  Service: ENT;  Laterality: N/A;   [place] N/A 01/19/2018   Procedure: TRANSPHENOIDAL  RESECTION OF PITUITARY TUMOR;  Surgeon: Jerrell Belfast, MD;  Location: [place];  Service: ENT;  Laterality: N/A;   [place]; 1997   Dr [name]    Allergies  Allergen Reactions   Penicillins Hives and Other (See Comments)    ++ got keflex in 2013 and 2019++ Has patient had a PCN reaction causing immediate rash, facial/tongue/throat swelling, SOB or lightheadedness with hypotension: No Has patient had a PCN reaction causing severe rash involving mucus membranes or skin necrosis: No PATIENT HAS HAD A PCN REACTION THAT REQUIRED HOSPITALIZATION:  #  #  YES  #  #  Has patient had a PCN reaction occurring within the last 10 years: No    Naproxen  History of GI intolerance with indomethacin.  She is on PPI for GERD.  NSAIDs should not be used long-term or as maintenance medication due to the comorbidities and age as per Beers' List   Codeine Nausea And Vomiting and Other (See Comments)   Indomethacin Diarrhea    Outpatient Encounter Medications as of 12/11/2020  Medication Sig   acetaminophen (TYLENOL) 650 MG CR tablet Take 650 mg by mouth every 6 (six) hours as needed for pain.   ADVAIR DISKUS 100-50 MCG/DOSE AEPB Inhale 1  puff into the lungs 2 (two) times daily.   albuterol (PROVENTIL HFA;VENTOLIN HFA) 108 (90 Base) MCG/ACT inhaler Inhale 2 puffs into the lungs every 6 (six) hours as needed for wheezing or shortness of breath.   bisacodyl (DULCOLAX) 10 MG suppository Constipation (2 of 4): If not relieved by MOM, give 10 mg Bisacodyl suppositiory rectally X 1 dose in 24 hours as needed (Do not use constipation standing orders for residents with renal failure/CFR less than 30. Contact MD for orders)   cetaphil (CETAPHIL) cream Apply 1 application topically at bedtime. Apply to bilateral lower legs for dry skin.   Cholecalciferol (VITAMIN D3) 125 MCG (5000 UT) CAPS Take 1 capsule by mouth daily.    esomeprazole (NEXIUM) 20 MG capsule Take 1 capsule (20 mg total) by mouth every morning. For acid reflex   furosemide (LASIX) 40 MG tablet TAKE ONE TABLET BY MOUTH EVERY DAY   hydrALAZINE (APRESOLINE) 25 MG tablet Take 25 mg by mouth 3 (three) times daily.    levETIRAcetam (KEPPRA) 500 MG tablet Take 1 tablet (500 mg total) by mouth 2 (two) times daily.   loperamide (IMODIUM A-D) 2 MG tablet Take 2 mg by mouth as needed for diarrhea or loose stools. Give after each stool prn, not to exceed 8 mg in 24 hours   losartan (COZAAR) 100 MG tablet Take 100 mg by mouth daily.   magnesium hydroxide (MILK OF MAGNESIA) 400 MG/5ML suspension Constipation (1 of 4): If no BM in 3 days, give 30 cc Milk of Magnesium p.o. x 1 dose in 24 hours as needed (Do not use standing constipation orders for residents with renal failure CFR less than 30. Contact MD for orders)   metFORMIN (GLUCOPHAGE) 500 MG tablet Take 500 mg by mouth 2 (two) times daily with a meal.   metoprolol succinate (TOPROL-XL) 25 MG 24 hr tablet Take 50 mg by mouth 2 (two) times daily.    mometasone (NASONEX) 50 MCG/ACT nasal spray Place 1 spray into the nose as needed.   promethazine (PHENERGAN) 25 MG tablet Take 25 mg by mouth every 6 (six) hours as needed for nausea or  vomiting. X 3 doses notify MD id symptoms persist more than 24 hours   Sodium Fluoride (DENTA 5000 PLUS DT) Place 1 application onto teeth every evening.   No facility-administered encounter medications on file as of 12/11/2020.    Review of Systems  GENERAL: No change in appetite, no fatigue, no weight changes, no fever or chills SKIN: Denies rash, itching, wounds, ulcer sores, or nail abnormalities MOUTH and THROAT: Denies oral discomfort, gingival pain or bleeding RESPIRATORY: no cough, SOB, DOE, wheezing, hemoptysis CARDIAC: No chest pain or palpitations GI: No abdominal pain, diarrhea, constipation, heart burn, nausea or vomiting GU: Denies dysuria, frequency, hematuria, incontinence, or discharge NEUROLOGICAL: Denies dizziness, syncope, numbness, or headache PSYCHIATRIC: Denies feelings of depression or anxiety. No report of hallucinations, insomnia, paranoia, or agitation   Immunization History  Administered Date(s)  Administered   Influenza Split 03/08/2013   Influenza Whole 09/03/2009, 06/03/2010   Influenza,inj,Quad PF,6+ Mos 06/25/2014   Influenza-Unspecified 03/25/2018, 05/06/2019, 05/14/2020   Moderna Sars-Covid-2 Vaccination 08/12/2019, 09/09/2019, 07/28/2020   Pneumococcal Polysaccharide-23 07/25/2002, 10/31/2017   Pneumococcal-Unspecified 05/07/2019   Td 07/25/2005   Tdap 05/03/2019   Unspecified SARS-COV-2 Vaccination 07/28/2020   Pertinent  Health Maintenance Due  Topic Date Due   OPHTHALMOLOGY EXAM  09/08/2019   PNA vac Low Risk Adult (2 of 2 - PCV13) 05/06/2020   MAMMOGRAM  03/03/2021 (Originally 01/14/2017)   DEXA SCAN  03/03/2021 (Originally 09/02/2017)   COLONOSCOPY (Pts 45-39yrs Insurance coverage will need to be confirmed)  08/25/2021 (Originally 09/02/1997)   HEMOGLOBIN A1C  02/09/2021   FOOT EXAM  02/18/2021   INFLUENZA VACCINE  02/22/2021   Fall Risk  05/22/2020 02/21/2019 03/06/2018 10/20/2017 09/01/2017  Falls in the past year? 1 (No Data) Yes Yes Yes   Comment - Emmi Telephone Survey: data to providers prior to load - - -  Number falls in past yr: 1 (No Data) 1 2 or more 2 or more  Comment - Emmi Telephone Survey Actual Response =  - - 7 times   Injury with Fall? 0 - Yes Yes No  Comment - - - Broken jaw -  Risk Factor Category  - - High Fall Risk High Fall Risk High Fall Risk  Risk for fall due to : History of fall(s);Impaired balance/gait;Impaired mobility - Impaired balance/gait History of fall(s);Impaired mobility;Impaired balance/gait Impaired balance/gait;Impaired mobility  Follow up Falls evaluation completed;Education provided;Falls prevention discussed - Falls prevention discussed - Falls prevention discussed     Vitals:   12/11/20 1039  BP: 132/70  Pulse: 94  Resp: 20  Temp: 97.7 F (36.5 C)  Weight: 196 lb 12.8 oz (89.3 kg)  Height: 5\' 1"  (1.549 m)   Body mass index is 37.19 kg/m.  Physical Exam  GENERAL APPEARANCE: Well nourished. In no acute distress. Morbidly obese. SKIN:  Skin is warm and dry.  MOUTH and THROAT: Lips are without lesions. Oral mucosa is moist and without lesions.  RESPIRATORY: Breathing is even & unlabored, BS CTAB CARDIAC: RRR, no murmur,no extra heart sounds, BLE 2+ edema GI: Abdomen soft, normal BS, no masses, no tenderness NEUROLOGICAL: There is no tremor. Speech is clear. Alert to self, disoriented to time and place. PSYCHIATRIC:  Affect and behavior are appropriate  Labs reviewed: Recent Labs    09/30/20 0000  NA 139  K 4.2  CL 99  CO2 25*  BUN 18  CREATININE 1.5*  CALCIUM 9.7   No results for input(s): AST, ALT, ALKPHOS, BILITOT, PROT, ALBUMIN in the last 8760 hours. Recent Labs    09/30/20 0000  WBC 9.5  HGB 10.6*  HCT 33*  PLT 461*   Lab Results  Component Value Date   TSH 4.22 09/30/2020   Lab Results  Component Value Date   HGBA1C 6.0 11/10/2020   Lab Results  Component Value Date   CHOL 178 08/30/2012   HDL 49 08/30/2012   LDLCALC 92 08/30/2012   TRIG  187 (H) 08/30/2012   CHOLHDL 3.6 08/30/2012     Assessment/Plan  1. Type 2 diabetes mellitus with morbid obesity ([place]) Lab Results  Component Value Date   HGBA1C 6.0 11/10/2020   -  CBGs controlled -Continue metformin and CBG checks  2. Mild asthma without complication, unspecified whether persistent -     no SOB/wheezing -    continue PRN mometasone nasal spray,  PRN albuterol HFA and Advair  3. Hypertension associated with diabetes ([place])    Stable, continue losartan, hydralazine and furosemide -   Monitor BPs  4. Seizure disorder ([place]) -    no recent seizures -    continue levetiracetam -    seizure precautions    Family/ staff Communication:   Discussed plan of care with resident and charge nurse.  Labs/tests ordered: None  Goals of care:   Long-term care   Durenda Age, DNP, MSN, FNP-BC [facility] [phone] (Monday-Friday 8:00 a.m. - 5:00 p.m.) [phone] (after hours)

## 2021-02-01 ENCOUNTER — Non-Acute Institutional Stay (SKILLED_NURSING_FACILITY): Payer: Medicare Other | Admitting: Adult Health

## 2021-02-01 ENCOUNTER — Encounter: Payer: Self-pay | Admitting: Adult Health

## 2021-02-01 DIAGNOSIS — G40909 Epilepsy, unspecified, not intractable, without status epilepticus: Secondary | ICD-10-CM

## 2021-02-01 DIAGNOSIS — E1159 Type 2 diabetes mellitus with other circulatory complications: Secondary | ICD-10-CM

## 2021-02-01 DIAGNOSIS — E1169 Type 2 diabetes mellitus with other specified complication: Secondary | ICD-10-CM

## 2021-02-01 DIAGNOSIS — J45909 Unspecified asthma, uncomplicated: Secondary | ICD-10-CM | POA: Diagnosis not present

## 2021-02-01 DIAGNOSIS — I152 Hypertension secondary to endocrine disorders: Secondary | ICD-10-CM

## 2021-02-01 NOTE — Progress Notes (Signed)
Location:  [facility] Room Number: 203-A Place of Service:  SNF (31) Provider:  Durenda Age, DNP, FNP-BC  Patient Care Team: Hendricks Limes, MD as PCP - General (Internal Medicine) Medina-Vargas, Senaida Lange, NP as Nurse Practitioner (Internal Medicine)  Extended Emergency Contact Information Primary Emergency Contact: Gaye, Scorza Home Phone: [phone] Relation: Sister Secondary Emergency Contact: Sandria Manly Mobile Phone: [phone] Relation: Niece  Code Status: Full   Goals of care: Advanced Directive information Advanced Directives 02/01/2021  Does Patient Have a Medical Advance Directive? Yes  Type of Advance Directive -  Does patient want to make changes to medical advance directive? No - Patient declined  Would patient like information on creating a medical advance directive? -  Pre-existing out of facility DNR order (yellow form or pink MOST form) -     Chief Complaint  Patient presents with   Medical Management of Chronic Issues    Routine Visit    HPI:  Pt is a 70 y.o. female seen today for medical management of chronic diseases. She is a long-term resident of [facility]. She has a PMH of essential hypertension, diabetes mellitus, NASH and history of pituitary microadenoma. She participates in restorative exercises such as 1) walking and 2) AROM. SBPs ranging from 130 to 155. She takes metoprolol tartrate 50 mg 1 tab twice a day, losartan 100 mg 1 tab daily and hydralazine 25 mg 3 times a day for hypertension.  CBGs ranging from 90-184.  She takes metformin 500 mg twice a day.  No reported seizures.  She takes levetiracetam 500 mg twice a day for seizure.   Past Medical History:  Diagnosis Date   Allergic rhinitis    Arthritis    "ankles, feet, knees" (10/30/2017)   Asthma    Bursitis    Carpal tunnel syndrome    GERD (gastroesophageal reflux disease)    HTN (hypertension)    Migraine    "a  couple/yr" (10/30/2017)   Obesity    Peripheral vascular disease ([place])    Pneumonia 1960s X 1   Sciatica    Tendinitis    Type II diabetes mellitus ([place])    Type II   UTI (urinary tract infection)    Past Surgical History:  Procedure Laterality Date   CRANIOTOMY N/A 01/19/2018   Procedure: ENDOSCOPIC TRANSPHENOIDAL RESECTION OF PITUITARY TUMOR;  Surgeon: Consuella Lose, MD;  Location: [place];  Service: Neurosurgery;  Laterality: N/A;  ENDOSCOPIC TRANSPHENOIDAL RESECTION OF PITUITARY TUMOR   GANGLION CYST EXCISION Left    KNEE ARTHROSCOPY Left 2003   PLANTAR FASCIA RELEASE Right 1995   "heel"   SINUS ENDO WITH FUSION N/A 01/19/2018   Procedure: SINUS ENDO WITH FUSION;  Surgeon: Jerrell Belfast, MD;  Location: [place];  Service: ENT;  Laterality: N/A;   [place] N/A 01/19/2018   Procedure: TRANSPHENOIDAL  RESECTION OF PITUITARY TUMOR;  Surgeon: Jerrell Belfast, MD;  Location: [place];  Service: ENT;  Laterality: N/A;   [place]; 1997   Dr [name]    Allergies  Allergen Reactions   Penicillins Hives and Other (See Comments)    ++ got keflex in 2013 and 2019++ Has patient had a PCN reaction causing immediate rash, facial/tongue/throat swelling, SOB or lightheadedness with hypotension: No Has patient had a PCN reaction causing severe rash involving mucus membranes or skin necrosis: No PATIENT HAS HAD A PCN REACTION THAT REQUIRED HOSPITALIZATION:  #  #  YES  #  #  Has patient had a PCN reaction occurring within the last 10 years: No    Naproxen     History of GI intolerance with indomethacin.  She is on PPI for GERD.  NSAIDs should not be used long-term or as maintenance medication due to the comorbidities and age as per [place]   Codeine Nausea And Vomiting and Other (See Comments)   Indomethacin Diarrhea    Outpatient Encounter Medications as of 02/01/2021  Medication Sig   acetaminophen (TYLENOL) 650 MG CR tablet  Take 650 mg by mouth every 6 (six) hours as needed for pain.   ADVAIR DISKUS 100-50 MCG/DOSE AEPB Inhale 1 puff into the lungs 2 (two) times daily.   albuterol (PROVENTIL HFA;VENTOLIN HFA) 108 (90 Base) MCG/ACT inhaler Inhale 2 puffs into the lungs every 6 (six) hours as needed for wheezing or shortness of breath.   bisacodyl (DULCOLAX) 10 MG suppository Constipation (2 of 4): If not relieved by MOM, give 10 mg Bisacodyl suppositiory rectally X 1 dose in 24 hours as needed (Do not use constipation standing orders for residents with renal failure/CFR less than 30. Contact MD for orders)   cetaphil (CETAPHIL) cream Apply 1 application topically at bedtime. Apply to bilateral lower legs for dry skin.   Cholecalciferol (VITAMIN D3) 125 MCG (5000 UT) CAPS Take 1 capsule by mouth daily.    esomeprazole (NEXIUM) 20 MG capsule Take 1 capsule (20 mg total) by mouth every morning. For acid reflex   furosemide (LASIX) 40 MG tablet TAKE ONE TABLET BY MOUTH EVERY DAY   hydrALAZINE (APRESOLINE) 25 MG tablet Take 25 mg by mouth 3 (three) times daily.    levETIRAcetam (KEPPRA) 500 MG tablet Take 1 tablet (500 mg total) by mouth 2 (two) times daily.   loperamide (IMODIUM A-D) 2 MG tablet Take 2 mg by mouth as needed for diarrhea or loose stools. Give after each stool prn, not to exceed 8 mg in 24 hours   losartan (COZAAR) 100 MG tablet Take 100 mg by mouth daily.   magnesium hydroxide (MILK OF MAGNESIA) 400 MG/5ML suspension Constipation (1 of 4): If no BM in 3 days, give 30 cc Milk of Magnesium p.o. x 1 dose in 24 hours as needed (Do not use standing constipation orders for residents with renal failure CFR less than 30. Contact MD for orders)   metFORMIN (GLUCOPHAGE) 500 MG tablet Take 500 mg by mouth 2 (two) times daily with a meal.   metoprolol succinate (TOPROL-XL) 25 MG 24 hr tablet Take 50 mg by mouth 2 (two) times daily.    mometasone (NASONEX) 50 MCG/ACT nasal spray Place 1 spray into the nose as needed.    Sodium Fluoride (DENTA 5000 PLUS DT) Place 1 application onto teeth every evening.   [DISCONTINUED] promethazine (PHENERGAN) 25 MG tablet Take 25 mg by mouth every 6 (six) hours as needed for nausea or vomiting. X 3 doses notify MD id symptoms persist more than 24 hours   No facility-administered encounter medications on file as of 02/01/2021.    Review of Systems  GENERAL: No change in appetite, no fatigue, no weight changes, no fever or chills  MOUTH and THROAT: Denies oral discomfort, gingival pain or bleeding RESPIRATORY: no cough, SOB, DOE, wheezing, hemoptysis CARDIAC: No chest pain or palpitations GI: No abdominal pain, diarrhea, constipation, heart burn, nausea or vomiting GU: Denies dysuria, frequency, hematuria or discharge NEUROLOGICAL: Denies dizziness, syncope, numbness, or headache PSYCHIATRIC: Denies feelings of  depression or anxiety. No report of hallucinations, insomnia, paranoia, or agitation   Immunization History  Administered Date(s) Administered   Influenza Split 03/08/2013   Influenza Whole 09/03/2009, 06/03/2010   Influenza,inj,Quad PF,6+ Mos 06/25/2014   Influenza-Unspecified 03/25/2018, 05/06/2019, 05/14/2020   Moderna Sars-Covid-2 Vaccination 08/12/2019, 09/09/2019, 07/28/2020   Pneumococcal Polysaccharide-23 07/25/2002, 10/31/2017   Pneumococcal-Unspecified 05/07/2019   Td 07/25/2005   Tdap 05/03/2019   Unspecified SARS-COV-2 Vaccination 07/28/2020   Pertinent  Health Maintenance Due  Topic Date Due   OPHTHALMOLOGY EXAM  09/08/2019   PNA vac Low Risk Adult (2 of 2 - PCV13) 05/06/2020   MAMMOGRAM  03/03/2021 (Originally 01/14/2017)   DEXA SCAN  03/03/2021 (Originally 09/02/2017)   COLONOSCOPY (Pts 45-27yrs Insurance coverage will need to be confirmed)  08/25/2021 (Originally 09/02/1997)   HEMOGLOBIN A1C  02/09/2021   FOOT EXAM  02/18/2021   INFLUENZA VACCINE  02/22/2021   Fall Risk  05/22/2020 02/21/2019 03/06/2018 10/20/2017 09/01/2017  Falls in the past  year? 1 (No Data) Yes Yes Yes  Comment - [facility]i Telephone Survey: data to providers prior to load - - -  Number falls in past yr: 1 (No Data) 1 2 or more 2 or more  Comment - Emmi Telephone Survey Actual Response =  - - 7 times   Injury with Fall? 0 - Yes Yes No  Comment - - - Broken jaw -  Risk Factor Category  - - High Fall Risk High Fall Risk High Fall Risk  Risk for fall due to : History of fall(s);Impaired balance/gait;Impaired mobility - Impaired balance/gait History of fall(s);Impaired mobility;Impaired balance/gait Impaired balance/gait;Impaired mobility  Follow up Falls evaluation completed;Education provided;Falls prevention discussed - Falls prevention discussed - Falls prevention discussed     Vitals:   02/01/21 1334  BP: (!) 144/80  Pulse: 85  Resp: 18  Temp: 97.7 F (36.5 C)  Weight: 193 lb (87.5 kg)  Height: 5\' 1"  (1.549 m)   Body mass index is 36.47 kg/m.  Physical Exam  GENERAL APPEARANCE: Well nourished. In no acute distress. Obese SKIN:  Skin is warm and dry.  MOUTH and THROAT: Lips are without lesions. Oral mucosa is moist and without lesions.  RESPIRATORY: Breathing is even & unlabored, BS CTAB CARDIAC: RRR, no murmur,no extra heart sounds, BLE 2+ edema GI: Abdomen soft, normal BS, no masses, no tenderness EXTREMITIES: Able to move x4 extremities NEUROLOGICAL: There is no tremor. Speech is clear.  Alert to self and place, disoriented to time  PSYCHIATRIC:  Affect and behavior are appropriate  Labs reviewed: Recent Labs    09/30/20 0000  NA 139  K 4.2  CL 99  CO2 25*  BUN 18  CREATININE 1.5*  CALCIUM 9.7   No results for input(s): AST, ALT, ALKPHOS, BILITOT, PROT, ALBUMIN in the last 8760 hours. Recent Labs    09/30/20 0000  WBC 9.5  HGB 10.6*  HCT 33*  PLT 461*   Lab Results  Component Value Date   TSH 4.22 09/30/2020   Lab Results  Component Value Date   HGBA1C 6.0 11/10/2020   Lab Results  Component Value Date   CHOL 178  08/30/2012   HDL 49 08/30/2012   LDLCALC 92 08/30/2012   TRIG 187 (H) 08/30/2012   CHOLHDL 3.6 08/30/2012    Significant Diagnostic Results in last 30 days:  No results found.  Assessment/Plan  1. Hypertension associated with diabetes ([place]) -   BPs stable, continue metoprolol tartrate, losartan and hydralazine  2. Type 2  diabetes mellitus with morbid obesity ([place]) Lab Results  Component Value Date   HGBA1C 6.0 11/10/2020   -Continue metformin and CBG checks  3. Mild asthma without complication, unspecified whether persistent -No wheezing/SOB, continue Advair and PRN albuterol  4. Seizure disorder ([place]) -No recent seizures, continue levetiracetam     Family/ staff Communication: Discussed plan of care with resident and charge nurse.  Labs/tests ordered: None  Goals of care:   Long-term care   Durenda Age, DNP, MSN, FNP-BC [facility] [phone] (Monday-Friday 8:00 a.m. - 5:00 p.m.) [phone] (after hours)

## 2021-02-18 ENCOUNTER — Non-Acute Institutional Stay (SKILLED_NURSING_FACILITY): Payer: Medicare Other | Admitting: Internal Medicine

## 2021-02-18 ENCOUNTER — Encounter: Payer: Self-pay | Admitting: Internal Medicine

## 2021-02-18 DIAGNOSIS — E119 Type 2 diabetes mellitus without complications: Secondary | ICD-10-CM | POA: Insufficient documentation

## 2021-02-18 DIAGNOSIS — E1159 Type 2 diabetes mellitus with other circulatory complications: Secondary | ICD-10-CM | POA: Diagnosis not present

## 2021-02-18 DIAGNOSIS — E1122 Type 2 diabetes mellitus with diabetic chronic kidney disease: Secondary | ICD-10-CM | POA: Insufficient documentation

## 2021-02-18 DIAGNOSIS — E1322 Other specified diabetes mellitus with diabetic chronic kidney disease: Secondary | ICD-10-CM | POA: Diagnosis not present

## 2021-02-18 DIAGNOSIS — I129 Hypertensive chronic kidney disease with stage 1 through stage 4 chronic kidney disease, or unspecified chronic kidney disease: Secondary | ICD-10-CM

## 2021-02-18 DIAGNOSIS — N1832 Chronic kidney disease, stage 3b: Secondary | ICD-10-CM | POA: Diagnosis not present

## 2021-02-18 DIAGNOSIS — N183 Chronic kidney disease, stage 3 unspecified: Secondary | ICD-10-CM | POA: Insufficient documentation

## 2021-02-18 DIAGNOSIS — IMO0001 Reserved for inherently not codable concepts without codable children: Secondary | ICD-10-CM

## 2021-02-18 DIAGNOSIS — D649 Anemia, unspecified: Secondary | ICD-10-CM | POA: Diagnosis not present

## 2021-02-18 DIAGNOSIS — I152 Hypertension secondary to endocrine disorders: Secondary | ICD-10-CM

## 2021-02-18 NOTE — Assessment & Plan Note (Addendum)
Current A1c is prediabetic at 6%.  She is on metformin 500 twice daily in the context of CKD stage IIIb. Update BMET & A1c and consider decreasing metformin and ARB if CKD is progressing.

## 2021-02-18 NOTE — Assessment & Plan Note (Addendum)
Blood pressure range is 113-160/60-90.  This is on multiple high-dose agents including 100 mg of losartan.  Problematic is her CKD stage IIIb. If the CKD progresses; the ARB should be weaned. With significant edema, non vasodilating CCB could be substituted for the ARB. For example Amlodipine would not be the best choice because of her peripheral edema.

## 2021-02-18 NOTE — Assessment & Plan Note (Signed)
09/30/2020 H/H 10.6/32.6, stable to slightly improved. No bleeding dyscrasias reported at the SNF.

## 2021-02-18 NOTE — Progress Notes (Signed)
   NURSING HOME LOCATION:  [facility] ROOM NUMBER:  [id number]  CODE STATUS: Full  PCP:  Pascal Lux MD  This is a nursing facility follow up visit of chronic medical diagnoses & to document compliance with Regulation 483.30 (c) in The [place] Manual Phase 2 which mandates caregiver visit ( visits can alternate among physician, PA or NP as per statutes) within 10 days of 30 days / 60 days/ 90 days post admission to SNF date    Interim medical record and care since last SNF visit was updated with review of diagnostic studies and change in clinical status since last visit were documented.  HPI: She is a permanent resident of this facility with medical diagnoses of allergic rhinitis, history of asthma, GERD, NASH,essential hypertension, PVD, neurocognitive deficits,and diabetes with CKD. 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 Most recent labs were in March and April of this year.  Hemoglobin/hematocrit had improved slightly to values of 10.6/32.6 with normochromic, normocytic indices.  CKD stage IIIb was present with a creatinine of 1.53 and GFR 40.  TSH was therapeutic at 4.22.   Blood pressure at the facility has ranged from a low of 113 up to a high of 160/60-90.  Review of systems: Dementia invalidated responses. Date given as "February 03, 2019". She identified the POTUS as "Lance". See confabulates without focus. She infers that her appetite varies.  She describes some cramping in the upper abdominal area.  She also seems to validate some dysphagia but denies dyspepsia or frank melena.  Stool is described as "dark".  She states that her weight is "up-and-down".  Physical exam:  Pertinent or positive findings: As noted she is a poor historian with some confabulation.  Grade 1/2 systolic murmur is present at the left base.  Abdomen is protuberant but nontender.  Pedal pulses are decreased.  She has tense  edema of the lower extremities.  General appearance: Adequately nourished; no acute distress, increased work of breathing is present.   Lymphatic: No lymphadenopathy about the head, neck, axilla. Eyes: No conjunctival inflammation or lid edema is present. There is no scleral icterus. Ears:  External ear exam shows no significant lesions or deformities.   Nose:  External nasal examination shows no deformity or inflammation. Nasal mucosa are pink and moist without lesions, exudates Oral exam: There is no oropharyngeal erythema or exudate. Neck:  No thyromegaly, masses, tenderness noted.    Heart:  Normal rate and regular rhythm. S1 and S2 normal without gallop,  click, rub .  Lungs:  without wheezes, rhonchi, rales, rubs. Abdomen: Bowel sounds are normal. Abdomen is soft and nontender with no organomegaly, hernias, masses. GU: Deferred  Extremities:  No cyanosis, clubbing Neurologic exam :Balance, Rhomberg, finger to nose testing could not be completed due to clinical state Skin: Warm & dry w/o tenting. No significant lesions or rash.  See summary under each active problem in the Problem List with associated updated therapeutic plan

## 2021-02-18 NOTE — Assessment & Plan Note (Signed)
09/30/20 creat 1.53/40;CKD Stage 3b Medication List reviewed; on Metformin & high dose ARB  If CKD progresses  ARB will be weaned or discontinued. A1c 6% ; decrease Metformin if CKD progresses

## 2021-02-18 NOTE — Patient Instructions (Signed)
See assessment and plan under each diagnosis in the problem list and acutely for this visit 

## 2021-03-09 LAB — BASIC METABOLIC PANEL
BUN: 17 (ref 4–21)
CO2: 24 — AB (ref 13–22)
Chloride: 100 (ref 99–108)
Creatinine: 1.4 — AB (ref <=1.1)
Glucose: 73
Potassium: 3.9 (ref 3.4–5.3)
Sodium: 137 (ref 137–147)

## 2021-03-09 LAB — HEMOGLOBIN A1C: Hemoglobin A1C: 6.1

## 2021-03-09 LAB — COMPREHENSIVE METABOLIC PANEL
Calcium: 9 (ref 8.7–10.7)
GFR calc Af Amer: 44.23
GFR calc non Af Amer: 38.16

## 2021-03-15 ENCOUNTER — Non-Acute Institutional Stay (SKILLED_NURSING_FACILITY): Payer: Medicare Other | Admitting: Adult Health

## 2021-03-15 ENCOUNTER — Encounter: Payer: Self-pay | Admitting: Adult Health

## 2021-03-15 DIAGNOSIS — E1169 Type 2 diabetes mellitus with other specified complication: Secondary | ICD-10-CM | POA: Diagnosis not present

## 2021-03-15 DIAGNOSIS — G40909 Epilepsy, unspecified, not intractable, without status epilepticus: Secondary | ICD-10-CM | POA: Diagnosis not present

## 2021-03-15 DIAGNOSIS — J45909 Unspecified asthma, uncomplicated: Secondary | ICD-10-CM

## 2021-03-15 DIAGNOSIS — I10 Essential (primary) hypertension: Secondary | ICD-10-CM | POA: Diagnosis not present

## 2021-03-15 DIAGNOSIS — R600 Localized edema: Secondary | ICD-10-CM

## 2021-03-15 NOTE — Progress Notes (Signed)
Location:  [place] Room Number: 203-A Place of Service:  SNF (31) Provider:  Durenda Age, DNP, FNP-BC  Patient Care Team: Hendricks Limes, MD as PCP - General (Internal Medicine) Medina-Vargas, Senaida Lange, NP as Nurse Practitioner (Internal Medicine)  Extended Emergency Contact Information Primary Emergency Contact: Olana, Bartolini Home Phone: [phone] Relation: Sister Secondary Emergency Contact: Sandria Manly Mobile Phone: [phone] Relation: Niece  Code Status: Full code   Goals of care: Advanced Directive information Advanced Directives 03/15/2021  Does Patient Have a Medical Advance Directive? Yes  Type of Advance Directive -  Does patient want to make changes to medical advance directive? No - Patient declined  Would patient like information on creating a medical advance directive? -  Pre-existing out of facility DNR order (yellow form or pink MOST form) -     Chief Complaint  Patient presents with   Medical Management of Chronic Issues    Routine Visit    HPI:  Pt is a 70 y.o. female seen today for medical management of chronic diseases. She is a long-term care resident of [facility]. She has a PMH of diabetes mellitus, essential hypertension, NASH and history of pituitary microadenoma. SBPs ranging from 127 to 153. She takes metoprolol tartrate 50 mg twice a day, losartan 100 mg daily and hydralazine 25 mg 3 times a day for hypertension.  CBGs Ranging from 86-184.  She thinks metformin 500 mg twice a day for diabetes mellitus. No reported wheezing nor SOB.  She takes albuterol inhaler PRN and Advair 100-51 puff into the lungs twice a day for asthma.   Past Medical History:  Diagnosis Date   Allergic rhinitis    Arthritis    "ankles, feet, knees" (10/30/2017)   Asthma    Bursitis    Carpal tunnel syndrome    GERD (gastroesophageal reflux disease)    HTN (hypertension)    Migraine    "a  couple/yr" (10/30/2017)   Obesity    Peripheral vascular disease ([place])    Pneumonia 1960s X 1   Sciatica    Tendinitis    Type II diabetes mellitus ([place])    Type II   UTI (urinary tract infection)    Past Surgical History:  Procedure Laterality Date   CRANIOTOMY N/A 01/19/2018   Procedure: ENDOSCOPIC TRANSPHENOIDAL RESECTION OF PITUITARY TUMOR;  Surgeon: Consuella Lose, MD;  Location: [place];  Service: Neurosurgery;  Laterality: N/A;  ENDOSCOPIC TRANSPHENOIDAL RESECTION OF PITUITARY TUMOR   GANGLION CYST EXCISION Left    KNEE ARTHROSCOPY Left 2003   PLANTAR FASCIA RELEASE Right 1995   "heel"   SINUS ENDO WITH FUSION N/A 01/19/2018   Procedure: SINUS ENDO WITH FUSION;  Surgeon: Jerrell Belfast, MD;  Location: [place];  Service: ENT;  Laterality: N/A;   [place] N/A 01/19/2018   Procedure: TRANSPHENOIDAL  RESECTION OF PITUITARY TUMOR;  Surgeon: Jerrell Belfast, MD;  Location: [place];  Service: ENT;  Laterality: N/A;   [place]; 1997   Dr [name]    Allergies  Allergen Reactions   Penicillins Hives and Other (See Comments)    ++ got keflex in 2013 and 2019++ Has patient had a PCN reaction causing immediate rash, facial/tongue/throat swelling, SOB or lightheadedness with hypotension: No Has patient had a PCN reaction causing severe rash involving mucus membranes or skin necrosis: No PATIENT HAS HAD A PCN REACTION THAT REQUIRED HOSPITALIZATION:  #  #  YES  #  #  Has patient had a PCN reaction occurring within the last 10 years: No    Naproxen     History of GI intolerance with indomethacin.  She is on PPI for GERD.  NSAIDs should not be used long-term or as maintenance medication due to the comorbidities and age as per Beers' List   Codeine Nausea And Vomiting and Other (See Comments)   Indomethacin Diarrhea    Outpatient Encounter Medications as of 03/15/2021  Medication Sig   ADVAIR DISKUS 100-50 MCG/DOSE AEPB Inhale  1 puff into the lungs 2 (two) times daily.   albuterol (PROVENTIL HFA;VENTOLIN HFA) 108 (90 Base) MCG/ACT inhaler Inhale 2 puffs into the lungs every 6 (six) hours as needed for wheezing or shortness of breath.   bisacodyl (DULCOLAX) 10 MG suppository Constipation (2 of 4): If not relieved by MOM, give 10 mg Bisacodyl suppositiory rectally X 1 dose in 24 hours as needed (Do not use constipation standing orders for residents with renal failure/CFR less than 30. Contact MD for orders)   cetaphil (CETAPHIL) cream Apply 1 application topically at bedtime. Apply to bilateral lower legs for dry skin.   Cholecalciferol (VITAMIN D3) 125 MCG (5000 UT) CAPS Take 1 capsule by mouth daily.    esomeprazole (NEXIUM) 20 MG capsule Take 1 capsule (20 mg total) by mouth every morning. For acid reflex   furosemide (LASIX) 40 MG tablet TAKE ONE TABLET BY MOUTH EVERY DAY   hydrALAZINE (APRESOLINE) 25 MG tablet Take 25 mg by mouth 3 (three) times daily.    levETIRAcetam (KEPPRA) 500 MG tablet Take 1 tablet (500 mg total) by mouth 2 (two) times daily.   loperamide (IMODIUM A-D) 2 MG tablet Take 2 mg by mouth as needed for diarrhea or loose stools. Give after each stool prn, not to exceed 8 mg in 24 hours   losartan (COZAAR) 100 MG tablet Take 100 mg by mouth daily.   magnesium hydroxide (MILK OF MAGNESIA) 400 MG/5ML suspension Constipation (1 of 4): If no BM in 3 days, give 30 cc Milk of Magnesium p.o. x 1 dose in 24 hours as needed (Do not use standing constipation orders for residents with renal failure CFR less than 30. Contact MD for orders)   metFORMIN (GLUCOPHAGE) 500 MG tablet Take 500 mg by mouth 2 (two) times daily with a meal.   metoprolol succinate (TOPROL-XL) 25 MG 24 hr tablet Take 50 mg by mouth 2 (two) times daily.    mometasone (NASONEX) 50 MCG/ACT nasal spray Place 1 spray into the nose as needed.   Sodium Fluoride (DENTA 5000 PLUS DT) Place 1 application onto teeth every evening.   acetaminophen  (TYLENOL) 650 MG CR tablet Take 650 mg by mouth every 6 (six) hours as needed for pain.   No facility-administered encounter medications on file as of 03/15/2021.    Review of Systems  GENERAL: No change in appetite, no fatigue, no weight changes, no fever, chills or weakness MOUTH and THROAT: Denies oral discomfort, gingival pain or bleeding, pain from teeth or hoarseness   RESPIRATORY: no cough, SOB, DOE, wheezing, hemoptysis CARDIAC: No chest pain, edema or palpitations GI: No abdominal pain, diarrhea, constipation, heart burn, nausea or vomiting GU: Denies dysuria, frequency, hematuria or discharge NEUROLOGICAL: Denies dizziness, syncope, numbness, or headache PSYCHIATRIC: Denies feelings of depression or anxiety. No report of hallucinations, insomnia, paranoia, or agitation   Immunization History  Administered Date(s) Administered   Influenza Split 03/08/2013   Influenza Whole 09/03/2009, 06/03/2010   Influenza,inj,Quad  PF,6+ Mos 06/25/2014   Influenza-Unspecified 03/25/2018, 05/06/2019, 05/14/2020   Moderna Sars-Covid-2 Vaccination 08/12/2019, 09/09/2019, 07/28/2020   Pneumococcal Polysaccharide-23 07/25/2002, 10/31/2017   Pneumococcal-Unspecified 05/07/2019   Td 07/25/2005   Tdap 05/03/2019   Unspecified SARS-COV-2 Vaccination 07/28/2020   Pertinent  Health Maintenance Due  Topic Date Due   MAMMOGRAM  01/14/2017   DEXA SCAN  Never done   OPHTHALMOLOGY EXAM  09/08/2019   PNA vac Low Risk Adult (2 of 2 - PCV13) 05/06/2020   FOOT EXAM  02/18/2021   INFLUENZA VACCINE  02/22/2021   COLONOSCOPY (Pts 45-82yr Insurance coverage will need to be confirmed)  08/25/2021 (Originally [birth year]02/1995)   HEMOGLOBIN A1C  06/09/2021   Fall Risk  05/22/2020 02/21/2019 03/06/2018 10/20/2017 09/01/2017  Falls in the past year? 1 (No Data) Yes Yes Yes  Comment - Emmi Telephone Survey: data to providers prior to load - - -  Number falls in past yr: 1 (No Data) 1 2 or more 2 or more  Comment - Emmi  Telephone Survey Actual Response =  - - 7 times   Injury with Fall? 0 - Yes Yes No  Comment - - - Broken jaw -  Risk Factor Category  - - High Fall Risk High Fall Risk High Fall Risk  Risk for fall due to : History of fall(s);Impaired balance/gait;Impaired mobility - Impaired balance/gait History of fall(s);Impaired mobility;Impaired balance/gait Impaired balance/gait;Impaired mobility  Follow up Falls evaluation completed;Education provided;Falls prevention discussed - Falls prevention discussed - Falls prevention discussed     Vitals:   03/15/21 1429  BP: (!) 153/88  Pulse: 90  Resp: 18  Temp: 98.2 F (36.8 C)  Weight: 192 lb 6.4 oz (87.3 kg)  Height: '5\' 1"'$  (1.549 m)   Body mass index is 36.35 kg/m.  Physical Exam  GENERAL APPEARANCE: Well nourished. In no acute distress.  Obese SKIN:  Skin is warm and dry.  MOUTH and THROAT: Lips are without lesions. Oral mucosa is moist and without lesions.  RESPIRATORY: Breathing is even & unlabored, BS CTAB CARDIAC: RRR, no murmur,no extra heart sounds, BLE 2+ edema GI: Abdomen soft, normal BS, no masses, no tenderness, EXTREMITIES: Able to move x4 extremities NEUROLOGICAL: There is no tremor. Speech is clear.  Alert to self, disoriented to time and place. PSYCHIATRIC:  Affect and behavior are appropriate  Labs reviewed: Recent Labs    09/30/20 0000 03/09/21 0000  NA 139 137  K 4.2 3.9  CL 99 100  CO2 25* 24*  BUN 18 17  CREATININE 1.5* 1.4*  CALCIUM 9.7 9.0   No results for input(s): AST, ALT, ALKPHOS, BILITOT, PROT, ALBUMIN in the last 8760 hours. Recent Labs    09/30/20 0000  WBC 9.5  HGB 10.6*  HCT 33*  PLT 461*   Lab Results  Component Value Date   TSH 4.22 09/30/2020   Lab Results  Component Value Date   HGBA1C 6.1 03/09/2021   Lab Results  Component Value Date   CHOL 178 08/30/2012   HDL 49 08/30/2012   LDLCALC 92 08/30/2012   TRIG 187 (H) 08/30/2012   CHOLHDL 3.6 08/30/2012    Significant  Diagnostic Results in last 30 days:  No results found.  Assessment/Plan  1. Uncontrolled hypertension -  BPs elevated, will increase hydralazine from 25 mg 3 times a day to 50 mg 3 times a day -    Monitor BPs  2. Type 2 diabetes mellitus with morbid obesity ([facility] Lab Results  Component Value Date  HGBA1C 6.1 03/09/2021   -   Continue metformin 500 mg twice a day  3. Mild asthma without complication, unspecified whether persistent -   No asthma attacks, continue PRN albuterol inhaler and Advair disK  4. Seizure disorder ([place]) -   No recent seizures, continue Ples Specter is  5. Lower extremity edema -  stable, continue furosemide     Family/ staff Communication: Discussed plan of care with resident and charge nurse.  Labs/tests ordered:   None  Goals of care:   Long-term care   Durenda Age, DNP, MSN, FNP-BC [facility] [phone] (Monday-Friday 8:00 a.m. - 5:00 p.m.) [phone] (after hours)

## 2021-04-13 ENCOUNTER — Non-Acute Institutional Stay (SKILLED_NURSING_FACILITY): Payer: Medicare Other | Admitting: Adult Health

## 2021-04-13 ENCOUNTER — Encounter: Payer: Self-pay | Admitting: Adult Health

## 2021-04-13 DIAGNOSIS — R197 Diarrhea, unspecified: Secondary | ICD-10-CM

## 2021-04-13 DIAGNOSIS — J45909 Unspecified asthma, uncomplicated: Secondary | ICD-10-CM | POA: Diagnosis not present

## 2021-04-13 DIAGNOSIS — G40909 Epilepsy, unspecified, not intractable, without status epilepticus: Secondary | ICD-10-CM | POA: Diagnosis not present

## 2021-04-13 DIAGNOSIS — E1169 Type 2 diabetes mellitus with other specified complication: Secondary | ICD-10-CM | POA: Diagnosis not present

## 2021-04-13 NOTE — Progress Notes (Signed)
Location:  [place] Room Number: 203-A Place of Service:  SNF (31) Provider:  Durenda Age, DNP, FNP-BC  Patient Care Team: Hendricks Limes, MD as PCP - General (Internal Medicine) Medina-Vargas, Senaida Lange, NP as Nurse Practitioner (Internal Medicine)  Extended Emergency Contact Information Primary Emergency Contact: Elgie, Landino Home Phone: [phone] Relation: Sister Secondary Emergency Contact: Sandria Manly Mobile Phone: [phone] Relation: Niece  Code Status:  FULL CODE  Goals of care: Advanced Directive information Advanced Directives 04/13/2021  Does Patient Have a Medical Advance Directive? No  Type of Advance Directive -  Does patient want to make changes to medical advance directive? No - Patient declined  Would patient like information on creating a medical advance directive? -  Pre-existing out of facility DNR order (yellow form or pink MOST form) -     Chief Complaint  Patient presents with   Medical Management of Chronic Issues    Routine Visit.     HPI:  Pt is a 70 y.o. female seen today for medical management of chronic diseases. She is a long-term care resident of [facility]. She has a PMH of diabetes mellitus, essential hypertension and history of pituitary microadenoma.She was seen in her room today. She complained of having diarrhea X 2. She denies having abdominal pain, fever nor chills.  She takes levetiracetam 500 mg twice a day for seizure.  No recent seizures. CBGs ranging from 91-194, outlier 83 and 81.  She takes metformin 500 twice a day for diabetes mellitus.   Past Medical History:  Diagnosis Date   Allergic rhinitis    Arthritis    "ankles, feet, knees" (10/30/2017)   Asthma    Bursitis    Carpal tunnel syndrome    GERD (gastroesophageal reflux disease)    HTN (hypertension)    Migraine    "a couple/yr" (10/30/2017)   Obesity    Peripheral vascular disease ([place])     Pneumonia 1960s X 1   Sciatica    Tendinitis    Type II diabetes mellitus ([place])    Type II   UTI (urinary tract infection)    Past Surgical History:  Procedure Laterality Date   CRANIOTOMY N/A 01/19/2018   Procedure: ENDOSCOPIC TRANSPHENOIDAL RESECTION OF PITUITARY TUMOR;  Surgeon: Consuella Lose, MD;  Location: [place];  Service: Neurosurgery;  Laterality: N/A;  ENDOSCOPIC TRANSPHENOIDAL RESECTION OF PITUITARY TUMOR   GANGLION CYST EXCISION Left    KNEE ARTHROSCOPY Left 2003   PLANTAR FASCIA RELEASE Right 1995   "heel"   SINUS ENDO WITH FUSION N/A 01/19/2018   Procedure: SINUS ENDO WITH FUSION;  Surgeon: Jerrell Belfast, MD;  Location: [place];  Service: ENT;  Laterality: N/A;   [place] N/A 01/19/2018   Procedure: TRANSPHENOIDAL  RESECTION OF PITUITARY TUMOR;  Surgeon: Jerrell Belfast, MD;  Location: [place];  Service: ENT;  Laterality: N/A;   [place]; 1997   Dr [name]    Allergies  Allergen Reactions   Penicillins Hives and Other (See Comments)    ++ got keflex in 2013 and 2019++ Has patient had a PCN reaction causing immediate rash, facial/tongue/throat swelling, SOB or lightheadedness with hypotension: No Has patient had a PCN reaction causing severe rash involving mucus membranes or skin necrosis: No PATIENT HAS HAD A PCN REACTION THAT REQUIRED HOSPITALIZATION:  #  #  YES  #  #  Has patient had a PCN reaction occurring within the  last 10 years: No    Naproxen     History of GI intolerance with indomethacin.  She is on PPI for GERD.  NSAIDs should not be used long-term or as maintenance medication due to the comorbidities and age as per Beers' List   Codeine Nausea And Vomiting and Other (See Comments)   Indomethacin Diarrhea    Outpatient Encounter Medications as of 04/13/2021  Medication Sig   acetaminophen (TYLENOL) 650 MG CR tablet Take 650 mg by mouth every 6 (six) hours as needed for pain.   ADVAIR  DISKUS 100-50 MCG/DOSE AEPB Inhale 1 puff into the lungs 2 (two) times daily.   albuterol (PROVENTIL HFA;VENTOLIN HFA) 108 (90 Base) MCG/ACT inhaler Inhale 2 puffs into the lungs every 6 (six) hours as needed for wheezing or shortness of breath.   barrier cream (NON-SPECIFIED) CREA Apply 1 application topically 2 (two) times daily as needed.   bisacodyl (DULCOLAX) 10 MG suppository Constipation (2 of 4): If not relieved by MOM, give 10 mg Bisacodyl suppositiory rectally X 1 dose in 24 hours as needed (Do not use constipation standing orders for residents with renal failure/CFR less than 30. Contact MD for orders)   cetaphil (CETAPHIL) cream Apply 1 application topically at bedtime. Apply to bilateral lower legs for dry skin.   Cholecalciferol (VITAMIN D3) 125 MCG (5000 UT) CAPS Take 1 capsule by mouth daily.    esomeprazole (NEXIUM) 20 MG capsule Take 1 capsule (20 mg total) by mouth every morning. For acid reflex   furosemide (LASIX) 40 MG tablet TAKE ONE TABLET BY MOUTH EVERY DAY   hydrALAZINE (APRESOLINE) 25 MG tablet Take 25 mg by mouth 3 (three) times daily.    levETIRAcetam (KEPPRA) 500 MG tablet Take 1 tablet (500 mg total) by mouth 2 (two) times daily.   loperamide (IMODIUM A-D) 2 MG tablet Take 2 mg by mouth as needed for diarrhea or loose stools. Give after each stool prn, not to exceed 8 mg in 24 hours   losartan (COZAAR) 100 MG tablet Take 100 mg by mouth daily.   magnesium hydroxide (MILK OF MAGNESIA) 400 MG/5ML suspension Constipation (1 of 4): If no BM in 3 days, give 30 cc Milk of Magnesium p.o. x 1 dose in 24 hours as needed (Do not use standing constipation orders for residents with renal failure CFR less than 30. Contact MD for orders)   metFORMIN (GLUCOPHAGE) 500 MG tablet Take 500 mg by mouth 2 (two) times daily with a meal.   metoprolol succinate (TOPROL-XL) 25 MG 24 hr tablet Take 50 mg by mouth 2 (two) times daily.    mometasone (NASONEX) 50 MCG/ACT nasal spray Place 1 spray  into the nose as needed.   promethazine (PHENERGAN) 25 MG tablet Take 25 mg by mouth every 6 (six) hours as needed for nausea or vomiting.   Sodium Phosphates (RA SALINE ENEMA RE) Place rectally as needed.   [DISCONTINUED] Sodium Fluoride (DENTA 5000 PLUS DT) Place 1 application onto teeth every evening.   No facility-administered encounter medications on file as of 04/13/2021.    Review of Systems  GENERAL: No change in appetite, no fatigue, no weight changes, no fever, chills  MOUTH and THROAT: Denies oral discomfort, gingival pain or bleeding, pain from teeth or hoarseness   RESPIRATORY: no cough, SOB, DOE, wheezing, hemoptysis CARDIAC: No chest pain or palpitations GI: + Diarrhea GU: Denies dysuria, frequency, hematuria or discharge NEUROLOGICAL: Denies dizziness, syncope, numbness, or headache PSYCHIATRIC: Denies feelings of depression  or anxiety. No report of hallucinations, insomnia, paranoia, or agitation   Immunization History  Administered Date(s) Administered   Influenza Split 03/08/2013   Influenza Whole 09/03/2009, 06/03/2010   Influenza,inj,Quad PF,6+ Mos 06/25/2014   Influenza-Unspecified 03/25/2018, 05/06/2019, 05/14/2020   Moderna Sars-Covid-2 Vaccination 08/12/2019, 09/09/2019, 07/28/2020   Pneumococcal Polysaccharide-23 07/25/2002, 10/31/2017   Pneumococcal-Unspecified 05/07/2019   Td 07/25/2005   Tdap 05/03/2019   Unspecified SARS-COV-2 Vaccination 07/28/2020   Pertinent  Health Maintenance Due  Topic Date Due   MAMMOGRAM  01/14/2017   DEXA SCAN  Never done   OPHTHALMOLOGY EXAM  09/08/2019   FOOT EXAM  02/18/2021   INFLUENZA VACCINE  02/22/2021   COLONOSCOPY (Pts 45-83yrs Insurance coverage will need to be confirmed)  08/25/2021 (Originally 09/02/1995)   HEMOGLOBIN A1C  06/09/2021   Fall Risk  05/22/2020 02/21/2019 03/06/2018 10/20/2017 09/01/2017  Falls in the past year? 1 (No Data) Yes Yes Yes  Comment - Emmi Telephone Survey: data to providers prior to load  - - -  Number falls in past yr: 1 (No Data) 1 2 or more 2 or more  Comment - Emmi Telephone Survey Actual Response =  - - 7 times   Injury with Fall? 0 - Yes Yes No  Comment - - - Broken jaw -  Risk Factor Category  - - High Fall Risk High Fall Risk High Fall Risk  Risk for fall due to : History of fall(s);Impaired balance/gait;Impaired mobility - Impaired balance/gait History of fall(s);Impaired mobility;Impaired balance/gait Impaired balance/gait;Impaired mobility  Follow up Falls evaluation completed;Education provided;Falls prevention discussed - Falls prevention discussed - Falls prevention discussed     Vitals:   04/13/21 1014  BP: 125/71  Pulse: 85  Resp: 19  Temp: 98.2 F (36.8 C)  Height: 5\' 1"  (1.549 m)   Body mass index is 36.35 kg/m.  Physical Exam  GENERAL APPEARANCE: Well nourished. In no acute distress.  Morbidly obese  SKIN:  Skin is warm and dry.  MOUTH and THROAT: Lips are without lesions. Oral mucosa is moist and without lesions.  RESPIRATORY: Breathing is even & unlabored, BS CTAB CARDIAC: RRR, no murmur,no extra heart sounds GI: Abdomen soft, normal BS, no masses, no tenderness, EXTREMITIES: Able to move x4 extremities. NEUROLOGICAL: There is no tremor. Speech is clear.  Alert to self, disoriented to time and place. PSYCHIATRIC:  Affect and behavior are appropriate  Labs reviewed: Recent Labs    09/30/20 0000 03/09/21 0000  NA 139 137  K 4.2 3.9  CL 99 100  CO2 25* 24*  BUN 18 17  CREATININE 1.5* 1.4*  CALCIUM 9.7 9.0   No results for input(s): AST, ALT, ALKPHOS, BILITOT, PROT, ALBUMIN in the last 8760 hours. Recent Labs    09/30/20 0000  WBC 9.5  HGB 10.6*  HCT 33*  PLT 461*   Lab Results  Component Value Date   TSH 4.22 09/30/2020   Lab Results  Component Value Date   HGBA1C 6.1 03/09/2021   Lab Results  Component Value Date   CHOL 178 08/30/2012   HDL 49 08/30/2012   LDLCALC 92 08/30/2012   TRIG 187 (H) 08/30/2012    CHOLHDL 3.6 08/30/2012    Significant Diagnostic Results in last 30 days:  No results found.  Assessment/Plan  1. Diarrhea  -Stool occult for C. difficile, BMP and CBC with differentials  2. Seizure disorder ([place]) -   No recent seizures, continue levetiracetam -    Seizure precautions  3. Type 2 diabetes  mellitus with morbid obesity ([place]) Lab Results  Component Value Date   HGBA1C 6.1 03/09/2021   -   Continue metformin and CBG checks  4. Mild asthma without complication, unspecified whether persistent -  no SOB/wheezing, continue  PRN albuterol, mometasone furoate nasal spray and Advair   Family/ staff Communication: Discussed plan of care with resident and charge nurse.  Labs/tests ordered: None  Goals of care:   Long-term care   Durenda Age, DNP, MSN, FNP-BC [facility] [phone] (Monday-Friday 8:00 a.m. - 5:00 p.m.) [phone] (after hours)

## 2021-04-14 LAB — COMPREHENSIVE METABOLIC PANEL
Calcium: 9.6 (ref 8.7–10.7)
GFR calc Af Amer: 43.46
GFR calc non Af Amer: 37.49

## 2021-04-14 LAB — BASIC METABOLIC PANEL
BUN: 16 (ref 4–21)
CO2: 31 — AB (ref 13–22)
Chloride: 102 (ref 99–108)
Creatinine: 1.4 — AB (ref 0.5–1.1)
Glucose: 74
Potassium: 4 (ref 3.4–5.3)
Sodium: 142 (ref 137–147)

## 2021-04-14 LAB — CBC: RBC: 3.66 — AB (ref 3.87–5.11)

## 2021-04-14 LAB — CBC AND DIFFERENTIAL
HCT: 30 — AB (ref 36–46)
Hemoglobin: 9.7 — AB (ref 12.0–16.0)
Platelets: 481 — AB (ref 150–399)
WBC: 9.6

## 2021-05-12 ENCOUNTER — Encounter: Payer: Self-pay | Admitting: Internal Medicine

## 2021-05-12 ENCOUNTER — Non-Acute Institutional Stay (SKILLED_NURSING_FACILITY): Payer: Medicare Other | Admitting: Internal Medicine

## 2021-05-12 DIAGNOSIS — D649 Anemia, unspecified: Secondary | ICD-10-CM | POA: Diagnosis not present

## 2021-05-12 DIAGNOSIS — N1832 Chronic kidney disease, stage 3b: Secondary | ICD-10-CM

## 2021-05-12 DIAGNOSIS — R4189 Other symptoms and signs involving cognitive functions and awareness: Secondary | ICD-10-CM

## 2021-05-12 DIAGNOSIS — E1122 Type 2 diabetes mellitus with diabetic chronic kidney disease: Secondary | ICD-10-CM

## 2021-05-12 DIAGNOSIS — E1159 Type 2 diabetes mellitus with other circulatory complications: Secondary | ICD-10-CM | POA: Diagnosis not present

## 2021-05-12 DIAGNOSIS — R29818 Other symptoms and signs involving the nervous system: Secondary | ICD-10-CM

## 2021-05-12 DIAGNOSIS — I129 Hypertensive chronic kidney disease with stage 1 through stage 4 chronic kidney disease, or unspecified chronic kidney disease: Secondary | ICD-10-CM

## 2021-05-12 DIAGNOSIS — I152 Hypertension secondary to endocrine disorders: Secondary | ICD-10-CM

## 2021-05-12 DIAGNOSIS — N183 Chronic kidney disease, stage 3 unspecified: Secondary | ICD-10-CM

## 2021-05-12 NOTE — Assessment & Plan Note (Signed)
No indication to wean or discontinue metformin or ARB as CKD stage IIIb is stable.

## 2021-05-12 NOTE — Assessment & Plan Note (Addendum)
Diabetic control is excellent as demonstrated by A1c of 6.1% Creatinine is 1.41 and GFR 44.23 indicating stage IIIa CKD which is stable serially.

## 2021-05-12 NOTE — Assessment & Plan Note (Addendum)
As is usually the case she began to confabulate about her family, her work status & financial data.

## 2021-05-12 NOTE — Progress Notes (Signed)
NURSING HOME LOCATION:  [facility] ROOM NUMBER:  [id number] A  CODE STATUS:  Full Code  PCP:  Pascal Lux MD  This is a nursing facility follow up visit of chronic medical diagnoses & to document compliance with Regulation 483.30 (c) in The [place] Manual Phase 2 which mandates caregiver visit ( visits can alternate among physician, PA or NP as per statutes) within 10 days of 30 days / 60 days/ 90 days post admission to SNF date    Interim medical record and care since last SNF visit was updated with review of diagnostic studies and change in clinical status since last visit were documented. Most recent lab results are summarized & compared to prior results under Current Assessment & Plan in the Problem List.  HPI: She is a permanent resident of this facility with medical diagnoses of history of asthma, NASH, GERD, PVD, essential hypertension, neurocognitive deficits, and diabetes with CKD. 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  She was recently evaluated for possible infectious diarrhea.  C. difficile studies were negative.  White blood count was 9600 with normal differential.  She exhibits a normochromic, normocytic anemia with H/H of 9.7/30.3.  These values dropped from H/H of 10/33 on 09/30/2020.  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  Serial BMETs have been stable.  Diabetes control is excellent as demonstrated by an A1c of 6.1%.  Review of systems: Dementia invalidated responses.  She describes diarrhea off and on described as loose-watery stools.  Initially she thought this was dietary related.  Subsequently she states that she was drinking "the wrong kind of milk".  She apparently states that when she drinks "milk from the red carton it causes stool changes but not the burgundy carton".  With that she states she lost 4  pounds.  She describes her edema as " up-and-down".  She states that her respiratory status is stable with some intermittent cough and sputum.   At that point she began to confabulate about planning retirement and asking what financial papers she should complete.  This has been a pattern in the past as well.  Constitutional: No fever, fatigue  Eyes: No redness, discharge, pain, vision change ENT/mouth: No nasal congestion,  purulent discharge, earache, change in hearing, sore throat  Cardiovascular: No chest pain, palpitations, paroxysmal nocturnal dyspnea, claudication Respiratory: No cough, sputum production, hemoptysis, DOE, significant snoring, apnea   Gastrointestinal: No heartburn, dysphagia, abdominal pain, nausea /vomiting, rectal bleeding, melena Genitourinary: No dysuria, hematuria, pyuria, incontinence, nocturia Musculoskeletal: No joint stiffness, joint swelling, weakness, pain Dermatologic: No rash, pruritus, change in appearance of skin Neurologic: No dizziness, headache, syncope, seizures, numbness, tingling Psychiatric: No significant anxiety, depression, insomnia, anorexia Endocrine: No change in hair/skin/nails, excessive thirst, excessive hunger, excessive urination  Hematologic/lymphatic: No significant bruising, lymphadenopathy, abnormal bleeding Allergy/immunology: No itchy/watery eyes, significant sneezing, urticaria, angioedema  Physical exam:  Pertinent or positive findings: Facies are masked. There is slight ptosis on the right.  The left nasolabial fold is decreased.  S1 is slightly increased.  There is a extremely brief systolic murmur at the left base suggested clinically.  She has low-grade rhonchi diffusely posteriorly.Abdomen is protuberant. She has tense peripheral edema with 1/2+ pitting.  The upper extremities are stronger than the lower extremities.   General appearance: Adequately nourished; no acute distress, increased work of breathing is present.    Lymphatic: No lymphadenopathy about  the head, neck, axilla. Eyes: No conjunctival inflammation or lid edema is present. There is no scleral icterus. Ears:  External ear exam shows no significant lesions or deformities.   Nose:  External nasal examination shows no deformity or inflammation. Nasal mucosa are pink and moist without lesions, exudates Oral exam:  Lips and gums are healthy appearing. There is no oropharyngeal erythema or exudate. Neck:  No thyromegaly, masses, tenderness noted.    Heart:  Normal rate and regular rhythm. S2 normal without gallop, click, rub .  Lungs:  without wheezes,  rales, rubs. Abdomen: Bowel sounds are normal. Abdomen is soft and nontender with no organomegaly, hernias, masses. GU: Deferred  Extremities:  No cyanosis, clubbing  Neurologic exam :Balance, Rhomberg, finger to nose testing could not be completed due to clinical state Skin: Warm & dry w/o tenting. No significant lesions or rash.  See summary under each active problem in the Problem List with associated updated therapeutic plan

## 2021-05-12 NOTE — Assessment & Plan Note (Addendum)
BP adequately controlled; no change in antihypertensive medications unless average consistently > 140/90.

## 2021-05-12 NOTE — Assessment & Plan Note (Addendum)
There has been a slight drop in H/H from 10/33 down to 9.7/30.3.  No bleeding dyscrasias reported.  CBC will be monitored.

## 2021-05-12 NOTE — Patient Instructions (Signed)
See assessment and plan under each diagnosis in the problem list and acutely for this visit 

## 2021-06-04 ENCOUNTER — Encounter: Payer: Self-pay | Admitting: Adult Health

## 2021-06-04 ENCOUNTER — Non-Acute Institutional Stay (SKILLED_NURSING_FACILITY): Payer: Medicare Other | Admitting: Adult Health

## 2021-06-04 DIAGNOSIS — G40909 Epilepsy, unspecified, not intractable, without status epilepticus: Secondary | ICD-10-CM | POA: Diagnosis not present

## 2021-06-04 DIAGNOSIS — N183 Chronic kidney disease, stage 3 unspecified: Secondary | ICD-10-CM

## 2021-06-04 DIAGNOSIS — I129 Hypertensive chronic kidney disease with stage 1 through stage 4 chronic kidney disease, or unspecified chronic kidney disease: Secondary | ICD-10-CM

## 2021-06-04 DIAGNOSIS — I152 Hypertension secondary to endocrine disorders: Secondary | ICD-10-CM

## 2021-06-04 DIAGNOSIS — J45909 Unspecified asthma, uncomplicated: Secondary | ICD-10-CM | POA: Diagnosis not present

## 2021-06-04 DIAGNOSIS — E1122 Type 2 diabetes mellitus with diabetic chronic kidney disease: Secondary | ICD-10-CM

## 2021-06-04 DIAGNOSIS — E1159 Type 2 diabetes mellitus with other circulatory complications: Secondary | ICD-10-CM

## 2021-06-04 NOTE — Progress Notes (Signed)
Location:  [facility] Room Number: 203-A Place of Service:  SNF (31) Provider:  Durenda Age, DNP, FNP-BC  Patient Care Team: Hendricks Limes, MD as PCP - General (Internal Medicine) Medina-Vargas, Senaida Lange, NP as Nurse Practitioner (Internal Medicine)  Extended Emergency Contact Information Primary Emergency Contact: Wyvonne, Carda Home Phone: [phone] Relation: Sister Secondary Emergency Contact: Sandria Manly Mobile Phone: [phone] Relation: Niece  Code Status: Full Code   Goals of care: Advanced Directive information Advanced Directives 06/04/2021  Does Patient Have a Medical Advance Directive? No  Type of Advance Directive -  Does patient want to make changes to medical advance directive? No - Patient declined  Would patient like information on creating a medical advance directive? -  Pre-existing out of facility DNR order (yellow form or pink MOST form) -     Chief Complaint  Patient presents with   Medical Management of Chronic Issues    Routine Visit    HPI:  Pt is a 70 y.o. female seen today for medical management of chronic diseases. She is a long-term care resident of [facility].  She has a PMH of diabetes mellitus, essential hypertension and history of pituitary microadenoma.  No reported seizures.  She takes levetiracetam 500 mg twice a day for seizure.  No SOB nor wheezing.  She takes albuterol HFA 90 mcg inhaler inhale 2 puffs into the lungs every 6 hours  PRN, mometasone furoate 50 mcg 1 spray in each nostril daily PRN and Advair 100-50 DISK 1 puff into the lungs twice a day for asthma. SBPs ranging from 113-140, with outlier 146.  She takes metoprolol tartrate 50 mg 1 tab twice a day, losartan 100 mg 1 tab daily and hydralazine 25 mg 3 times a day for hypertension. CBGs ranging from 91-191, with outlier 79 and 230.  She takes metformin 500 mg twice a day for diabetes mellitus.  Latest A1c 6.1,  03/09/2021.   Past Medical History:  Diagnosis Date   Allergic rhinitis    Arthritis    "ankles, feet, knees" (10/30/2017)   Asthma    Bursitis    Carpal tunnel syndrome    GERD (gastroesophageal reflux disease)    HTN (hypertension)    Migraine    "a couple/yr" (10/30/2017)   Obesity    Peripheral vascular disease ([place])    Pneumonia 1960s X 1   Sciatica    Tendinitis    Type II diabetes mellitus ([place])    Type II   UTI (urinary tract infection)    Past Surgical History:  Procedure Laterality Date   CRANIOTOMY N/A 01/19/2018   Procedure: ENDOSCOPIC TRANSPHENOIDAL RESECTION OF PITUITARY TUMOR;  Surgeon: Consuella Lose, MD;  Location: [place];  Service: Neurosurgery;  Laterality: N/A;  ENDOSCOPIC TRANSPHENOIDAL RESECTION OF PITUITARY TUMOR   GANGLION CYST EXCISION Left    KNEE ARTHROSCOPY Left 2003   PLANTAR FASCIA RELEASE Right 1995   "heel"   SINUS ENDO WITH FUSION N/A 01/19/2018   Procedure: SINUS ENDO WITH FUSION;  Surgeon: Jerrell Belfast, MD;  Location: [place];  Service: ENT;  Laterality: N/A;   [place] N/A 01/19/2018   Procedure: TRANSPHENOIDAL  RESECTION OF PITUITARY TUMOR;  Surgeon: Jerrell Belfast, MD;  Location: [place];  Service: ENT;  Laterality: N/A;   [place]; 1997   Dr [name]    Allergies  Allergen Reactions   Penicillins Hives and Other (See Comments)    ++  got keflex in 2013 and 2019++ Has patient had a PCN reaction causing immediate rash, facial/tongue/throat swelling, SOB or lightheadedness with hypotension: No Has patient had a PCN reaction causing severe rash involving mucus membranes or skin necrosis: No PATIENT HAS HAD A PCN REACTION THAT REQUIRED HOSPITALIZATION:  #  #  YES  #  #  Has patient had a PCN reaction occurring within the last 10 years: No    Naproxen     History of GI intolerance with indomethacin.  She is on PPI for GERD.  NSAIDs should not be used long-term or as  maintenance medication due to the comorbidities and age as per [place]   Codeine Nausea And Vomiting and Other (See Comments)   Indomethacin Diarrhea    Outpatient Encounter Medications as of 06/04/2021  Medication Sig   acetaminophen (TYLENOL) 650 MG CR tablet Take 650 mg by mouth every 6 (six) hours as needed for pain.   ADVAIR DISKUS 100-50 MCG/DOSE AEPB Inhale 1 puff into the lungs 2 (two) times daily.   albuterol (PROVENTIL HFA;VENTOLIN HFA) 108 (90 Base) MCG/ACT inhaler Inhale 2 puffs into the lungs every 6 (six) hours as needed for wheezing or shortness of breath.   barrier cream (NON-SPECIFIED) CREA Apply 1 application topically 2 (two) times daily as needed.   bisacodyl (DULCOLAX) 10 MG suppository Constipation (2 of 4): If not relieved by MOM, give 10 mg Bisacodyl suppositiory rectally X 1 dose in 24 hours as needed (Do not use constipation standing orders for residents with renal failure/CFR less than 30. Contact MD for orders)   cetaphil (CETAPHIL) cream Apply 1 application topically at bedtime. Apply to bilateral lower legs for dry skin.   Cholecalciferol (VITAMIN D3) 125 MCG (5000 UT) CAPS Take 1 capsule by mouth daily.    esomeprazole (NEXIUM) 20 MG capsule Take 1 capsule (20 mg total) by mouth every morning. For acid reflex   furosemide (LASIX) 40 MG tablet TAKE ONE TABLET BY MOUTH EVERY DAY   hydrALAZINE (APRESOLINE) 25 MG tablet Take 25 mg by mouth 3 (three) times daily.    levETIRAcetam (KEPPRA) 500 MG tablet Take 1 tablet (500 mg total) by mouth 2 (two) times daily.   loperamide (IMODIUM A-D) 2 MG tablet Take 2 mg by mouth as needed for diarrhea or loose stools. Give after each stool prn, not to exceed 8 mg in 24 hours   losartan (COZAAR) 100 MG tablet Take 100 mg by mouth daily.   magnesium hydroxide (MILK OF MAGNESIA) 400 MG/5ML suspension Constipation (1 of 4): If no BM in 3 days, give 30 cc Milk of Magnesium p.o. x 1 dose in 24 hours as needed (Do not use standing  constipation orders for residents with renal failure CFR less than 30. Contact MD for orders)   metFORMIN (GLUCOPHAGE) 500 MG tablet Take 500 mg by mouth 2 (two) times daily with a meal.   metoprolol succinate (TOPROL-XL) 25 MG 24 hr tablet Take 50 mg by mouth 2 (two) times daily.    mometasone (NASONEX) 50 MCG/ACT nasal spray Place 1 spray into the nose as needed.   promethazine (PHENERGAN) 25 MG tablet Take 25 mg by mouth every 6 (six) hours as needed for nausea or vomiting.   Sodium Phosphates (RA SALINE ENEMA RE) Place rectally as needed.   No facility-administered encounter medications on file as of 06/04/2021.    Review of Systems  GENERAL: No change in appetite, no fatigue, no weight changes, no fever or chills  MOUTH and THROAT: Denies oral discomfort, gingival pain or bleeding RESPIRATORY: no cough, SOB, DOE, wheezing, hemoptysis CARDIAC: No chest pain or palpitations GI: No abdominal pain, diarrhea, constipation, heart burn, nausea or vomiting GU: Denies dysuria, frequency, hematuria or discharge NEUROLOGICAL: Denies dizziness, syncope, numbness, or headache PSYCHIATRIC: Denies feelings of depression or anxiety. No report of hallucinations, insomnia, paranoia, or agitation    Immunization History  Administered Date(s) Administered   Influenza Split 03/08/2013   Influenza Whole 09/03/2009, 06/03/2010   Influenza,inj,Quad PF,6+ Mos 06/25/2014   Influenza-Unspecified 03/25/2018, 05/06/2019, 05/14/2020   Moderna Sars-Covid-2 Vaccination 08/12/2019, 09/09/2019, 07/28/2020   Pneumococcal Polysaccharide-23 07/25/2002, 10/31/2017   Pneumococcal-Unspecified 05/07/2019   Td 07/25/2005   Tdap 05/03/2019   Unspecified SARS-COV-2 Vaccination 07/28/2020   Pertinent  Health Maintenance Due  Topic Date Due   MAMMOGRAM  01/14/2017   DEXA SCAN  Never done   OPHTHALMOLOGY EXAM  09/08/2019   FOOT EXAM  02/18/2021   INFLUENZA VACCINE  02/22/2021   HEMOGLOBIN A1C  06/09/2021    COLONOSCOPY (Pts 45-21yrs Insurance coverage will need to be confirmed)  08/25/2021 (Originally 09/02/1997)   Fall Risk 06/03/2018 06/04/2018 06/04/2018 02/21/2019 05/22/2020  Falls in the past year? - - - (No Data) 1  Number of falls in past year - - - Emmi Telephone Survey Actual Response =  -  Was there an injury with Fall? - - - - 0  Was there an injury with Fall? - - - - -  Fall Risk Category Calculator - - - - 2  Fall Risk Category - - - - Moderate  Patient Fall Risk Level High fall risk High fall risk High fall risk - Moderate fall risk  Patient at Risk for Falls Due to - - - - History of fall(s);Impaired balance/gait;Impaired mobility  Fall risk Follow up - - - - Falls evaluation completed;Education provided;Falls prevention discussed     Vitals:   06/04/21 0954  BP: (!) 113/59  Pulse: 94  Resp: 19  Temp: (!) 97.3 F (36.3 C)  Weight: 186 lb (84.4 kg)  Height: 5\' 1"  (1.549 m)   Body mass index is 35.14 kg/m.  Physical Exam  GENERAL APPEARANCE: Well nourished. In no acute distress. Obese. SKIN:  Skin is warm and dry.  MOUTH and THROAT: Lips are without lesions. Oral mucosa is moist and without lesions.  RESPIRATORY: Breathing is even & unlabored, BS CTAB CARDIAC: RRR, no murmur,no extra heart sounds, BLE 1- 2+ edema GI: Abdomen soft, normal BS, no masses, no tenderness, EXTREMITIES:  Able toto move X 4 extremities NEUROLOGICAL: There is no tremor. Speech is clear.  Alert to self, disoriented to time and place. PSYCHIATRIC:  Affect and behavior are appropriate  Labs reviewed: Recent Labs    09/30/20 0000 03/09/21 0000 04/14/21 0000  NA 139 137 142  K 4.2 3.9 4.0  CL 99 100 102  CO2 25* 24* 31*  BUN 18 17 16   CREATININE 1.5* 1.4* 1.4*  CALCIUM 9.7 9.0 9.6   No results for input(s): AST, ALT, ALKPHOS, BILITOT, PROT, ALBUMIN in the last 8760 hours. Recent Labs    09/30/20 0000 04/14/21 0000  WBC 9.5 9.6  HGB 10.6* 9.7*  HCT 33* 30*  PLT 461* 481*   Lab  Results  Component Value Date   TSH 4.22 09/30/2020   Lab Results  Component Value Date   HGBA1C 6.1 03/09/2021   Lab Results  Component Value Date   CHOL 178 08/30/2012   HDL 49  08/30/2012   LDLCALC 92 08/30/2012   TRIG 187 (H) 08/30/2012   CHOLHDL 3.6 08/30/2012    Significant Diagnostic Results in last 30 days:  No results found.  Assessment/Plan  1. Seizure disorder ([place]) -   No reported seizures, continue levetiracetam  2. Mild asthma without complication, unspecified whether persistent -  no SOB nor wheezing, stable -   Continue Advair DISK, PRN Mometasone nasal spray and PRN albuterol  3. Hypertension associated with diabetes ([place]) -  BPs stable, continue metoprolol tartrate, losartan and hydralazine  4. Type 2 DM with CKD stage 3 and hypertension ([place]) Lab Results  Component Value Date   HGBA1C 6.1 03/09/2021   -  CBGs controlled, continue metformin   Family/ staff Communication: Discussed plan of care with resident and charge nurse.  Labs/tests ordered:  None  Goals of care:   Long-term care   Durenda Age, DNP, MSN, FNP-BC [facility] [phone] (Monday-Friday 8:00 a.m. - 5:00 p.m.) [phone] (after hours)

## 2021-06-24 LAB — HM HEPATITIS C SCREENING LAB: HM Hepatitis Screen: NEGATIVE

## 2021-07-05 ENCOUNTER — Encounter: Payer: Self-pay | Admitting: Adult Health

## 2021-07-05 ENCOUNTER — Non-Acute Institutional Stay (SKILLED_NURSING_FACILITY): Payer: Medicare Other | Admitting: Adult Health

## 2021-07-05 DIAGNOSIS — R29818 Other symptoms and signs involving the nervous system: Secondary | ICD-10-CM

## 2021-07-05 DIAGNOSIS — J45909 Unspecified asthma, uncomplicated: Secondary | ICD-10-CM

## 2021-07-05 DIAGNOSIS — G40909 Epilepsy, unspecified, not intractable, without status epilepticus: Secondary | ICD-10-CM

## 2021-07-05 DIAGNOSIS — R4189 Other symptoms and signs involving cognitive functions and awareness: Secondary | ICD-10-CM

## 2021-07-05 DIAGNOSIS — I152 Hypertension secondary to endocrine disorders: Secondary | ICD-10-CM

## 2021-07-05 DIAGNOSIS — E1159 Type 2 diabetes mellitus with other circulatory complications: Secondary | ICD-10-CM

## 2021-07-05 DIAGNOSIS — N183 Chronic kidney disease, stage 3 unspecified: Secondary | ICD-10-CM

## 2021-07-05 DIAGNOSIS — E1122 Type 2 diabetes mellitus with diabetic chronic kidney disease: Secondary | ICD-10-CM

## 2021-07-05 DIAGNOSIS — I129 Hypertensive chronic kidney disease with stage 1 through stage 4 chronic kidney disease, or unspecified chronic kidney disease: Secondary | ICD-10-CM

## 2021-07-05 NOTE — Progress Notes (Signed)
Location:  [facility] Room Number: 203-A Place of Service:  SNF (31) Provider:  Durenda Age, DNP, FNP-BC  Patient Care Team: Hendricks Limes, MD as PCP - General (Internal Medicine) Medina-Vargas, Senaida Lange, NP as Nurse Practitioner (Internal Medicine)  Extended Emergency Contact Information Primary Emergency Contact: Maribeth, Jiles Home Phone: [phone] Relation: Sister Secondary Emergency Contact: Sandria Manly Mobile Phone: [phone] Relation: Niece  Code Status: Full Code   Goals of care: Advanced Directive information Advanced Directives 07/05/2021  Does Patient Have a Medical Advance Directive? No  Type of Advance Directive -  Does patient want to make changes to medical advance directive? No - Patient declined  Would patient like information on creating a medical advance directive? -  Pre-existing out of facility DNR order (yellow form or pink MOST form) -     Chief Complaint  Patient presents with   Medical Management of Chronic Issues    Routine    HPI:  Pt is a 70 y.o. female seen today for medical management of chronic diseases.  She is a long-term care resident of [facility]. She has a PMH of diabetes mellitus, essential hypertension and history of pituitary microadenoma.  SBP's ranging from 134-141.  She takes metoprolol tartrate 50 mg 1 tab twice a day , Hydralazine 25 mg TID and losartan 100 mg 1 tab daily for hypertension.  CBGs ranging from 82-222.  She takes metformin 500 mg twice a day for diabetes mellitus.  No SOB nor wheezing.  She takes Advair 100-50 mcg DISK 1 puff into the lungs twice a day and albuterol HFA 90 mcg inhaler inhale 2 puffs into the lungs every 6 hours PRN for asthma.   Past Medical History:  Diagnosis Date   Allergic rhinitis    Arthritis    "ankles, feet, knees" (10/30/2017)   Asthma    Bursitis    Carpal tunnel syndrome    GERD (gastroesophageal reflux disease)     HTN (hypertension)    Migraine    "a couple/yr" (10/30/2017)   Obesity    Peripheral vascular disease ([place])    Pneumonia 1960s X 1   Sciatica    Tendinitis    Type II diabetes mellitus ([place])    Type II   UTI (urinary tract infection)    Past Surgical History:  Procedure Laterality Date   CRANIOTOMY N/A 01/19/2018   Procedure: ENDOSCOPIC TRANSPHENOIDAL RESECTION OF PITUITARY TUMOR;  Surgeon: Consuella Lose, MD;  Location: [place];  Service: Neurosurgery;  Laterality: N/A;  ENDOSCOPIC TRANSPHENOIDAL RESECTION OF PITUITARY TUMOR   GANGLION CYST EXCISION Left    KNEE ARTHROSCOPY Left 2003   PLANTAR FASCIA RELEASE Right 1995   "heel"   SINUS ENDO WITH FUSION N/A 01/19/2018   Procedure: SINUS ENDO WITH FUSION;  Surgeon: Jerrell Belfast, MD;  Location: [place];  Service: ENT;  Laterality: N/A;   [place] N/A 01/19/2018   Procedure: TRANSPHENOIDAL  RESECTION OF PITUITARY TUMOR;  Surgeon: Jerrell Belfast, MD;  Location: [place];  Service: ENT;  Laterality: N/A;   [place]; 1997   Dr [name]    Allergies  Allergen Reactions   Penicillins Hives and Other (See Comments)    ++ got keflex in 2013 and 2019++ Has patient had a PCN reaction causing immediate rash, facial/tongue/throat swelling, SOB or lightheadedness with hypotension: No Has patient had a PCN reaction causing severe rash involving mucus membranes or skin  necrosis: No PATIENT HAS HAD A PCN REACTION THAT REQUIRED HOSPITALIZATION:  #  #  YES  #  #  Has patient had a PCN reaction occurring within the last 10 years: No    Naproxen     History of GI intolerance with indomethacin.  She is on PPI for GERD.  NSAIDs should not be used long-term or as maintenance medication due to the comorbidities and age as per [place]   Codeine Nausea And Vomiting and Other (See Comments)   Indomethacin Diarrhea    Outpatient Encounter Medications as of 07/05/2021  Medication Sig    acetaminophen (TYLENOL) 650 MG CR tablet Take 650 mg by mouth every 6 (six) hours as needed for pain.   ADVAIR DISKUS 100-50 MCG/DOSE AEPB Inhale 1 puff into the lungs 2 (two) times daily.   albuterol (PROVENTIL HFA;VENTOLIN HFA) 108 (90 Base) MCG/ACT inhaler Inhale 2 puffs into the lungs every 6 (six) hours as needed for wheezing or shortness of breath.   bisacodyl (DULCOLAX) 10 MG suppository Constipation (2 of 4): If not relieved by MOM, give 10 mg Bisacodyl suppositiory rectally X 1 dose in 24 hours as needed (Do not use constipation standing orders for residents with renal failure/CFR less than 30. Contact MD for orders)   cetaphil (CETAPHIL) cream Apply 1 application topically at bedtime. Apply to bilateral lower legs for dry skin.   Cholecalciferol (VITAMIN D3) 125 MCG (5000 UT) CAPS Take 1 capsule by mouth daily.    esomeprazole (NEXIUM) 20 MG capsule Take 1 capsule (20 mg total) by mouth every morning. For acid reflex   furosemide (LASIX) 40 MG tablet TAKE ONE TABLET BY MOUTH EVERY DAY   hydrALAZINE (APRESOLINE) 25 MG tablet Take 25 mg by mouth 3 (three) times daily.    levETIRAcetam (KEPPRA) 500 MG tablet Take 1 tablet (500 mg total) by mouth 2 (two) times daily.   loperamide (IMODIUM A-D) 2 MG tablet Take 2 mg by mouth as needed for diarrhea or loose stools. Give after each stool prn, not to exceed 8 mg in 24 hours   losartan (COZAAR) 100 MG tablet Take 100 mg by mouth daily.   magnesium hydroxide (MILK OF MAGNESIA) 400 MG/5ML suspension Constipation (1 of 4): If no BM in 3 days, give 30 cc Milk of Magnesium p.o. x 1 dose in 24 hours as needed (Do not use standing constipation orders for residents with renal failure CFR less than 30. Contact MD for orders)   metFORMIN (GLUCOPHAGE) 500 MG tablet Take 500 mg by mouth 2 (two) times daily with a meal.   metoprolol succinate (TOPROL-XL) 25 MG 24 hr tablet Take 50 mg by mouth 2 (two) times daily.    mometasone (NASONEX) 50 MCG/ACT nasal spray  Place 1 spray into the nose as needed.   Sodium Phosphates (RA SALINE ENEMA RE) Place rectally as needed.   barrier cream (NON-SPECIFIED) CREA Apply 1 application topically 2 (two) times daily as needed.   promethazine (PHENERGAN) 25 MG tablet Take 25 mg by mouth every 6 (six) hours as needed for nausea or vomiting.   No facility-administered encounter medications on file as of 07/05/2021.    Review of Systems  GENERAL: No change in appetite, no fatigue, no weight changes, no fever, orchills  MOUTH and THROAT: Denies oral discomfort, gingival pain or bleeding RESPIRATORY: no cough, SOB, DOE, wheezing, hemoptysis CARDIAC: No chest pain or palpitations GI: No abdominal pain, diarrhea, constipation, heart burn, nausea or vomiting GU: Denies dysuria,  frequency, hematuria or discharge NEUROLOGICAL: Denies dizziness, syncope, numbness, or headache PSYCHIATRIC: Denies feelings of depression or anxiety. No report of hallucinations, insomnia, paranoia, or agitation   Immunization History  Administered Date(s) Administered   Influenza Split 03/08/2013   Influenza Whole 09/03/2009, 06/03/2010   Influenza,inj,Quad PF,6+ Mos 06/25/2014   Influenza-Unspecified 03/25/2018, 05/06/2019, 05/14/2020   Moderna Sars-Covid-2 Vaccination 08/12/2019, 09/09/2019, 07/28/2020   Pneumococcal Polysaccharide-23 07/25/2002, 10/31/2017   Pneumococcal-Unspecified 05/07/2019   Td 07/25/2005   Tdap 05/03/2019   Unspecified SARS-COV-2 Vaccination 07/28/2020   Pertinent  Health Maintenance Due  Topic Date Due   MAMMOGRAM  01/14/2017   DEXA SCAN  Never done   OPHTHALMOLOGY EXAM  09/08/2019   FOOT EXAM  02/18/2021   INFLUENZA VACCINE  02/22/2021   HEMOGLOBIN A1C  06/09/2021   COLONOSCOPY (Pts 45-3yrs Insurance coverage will need to be confirmed)  08/25/2021 (Originally 09/02/1997)   Fall Risk 06/03/2018 06/04/2018 06/04/2018 02/21/2019 05/22/2020  Falls in the past year? - - - (No Data) 1  Number of falls in  past year - - - [place]i Telephone Survey Actual Response =  -  Was there an injury with Fall? - - - - 0  Was there an injury with Fall? - - - - -  Fall Risk Category Calculator - - - - 2  Fall Risk Category - - - - Moderate  Patient Fall Risk Level High fall risk High fall risk High fall risk - Moderate fall risk  Patient at Risk for Falls Due to - - - - History of fall(s);Impaired balance/gait;Impaired mobility  Fall risk Follow up - - - - Falls evaluation completed;Education provided;Falls prevention discussed     Vitals:   07/05/21 1001  BP: 129/72  Pulse: 62  Resp: 19  Temp: (!) 97.2 F (36.2 C)  Weight: 185 lb 6.4 oz (84.1 kg)  Height: 5\' 1"  (1.549 m)   Body mass index is 35.03 kg/m.  Physical Exam  GENERAL APPEARANCE: Well nourished. In no acute distress. Obese. SKIN:  Skin is warm and dry.  MOUTH and THROAT: Lips are without lesions. Oral mucosa is moist and without lesions.  RESPIRATORY: Breathing is even & unlabored, BS CTAB CARDIAC: RRR, no murmur,no extra heart sounds, no edema GI: Abdomen soft, normal BS, no masses, no tenderness EXTREMITIES:  Able to move X 4 extremities NEUROLOGICAL: There is no tremor. Speech is clear. Alert to self, disoriented to time and place. PSYCHIATRIC: . Affect and behavior are appropriate  Labs reviewed: Recent Labs    09/30/20 0000 03/09/21 0000 04/14/21 0000  NA 139 137 142  K 4.2 3.9 4.0  CL 99 100 102  CO2 25* 24* 31*  BUN 18 17 16   CREATININE 1.5* 1.4* 1.4*  CALCIUM 9.7 9.0 9.6   No results for input(s): AST, ALT, ALKPHOS, BILITOT, PROT, ALBUMIN in the last 8760 hours. Recent Labs    09/30/20 0000 04/14/21 0000  WBC 9.5 9.6  HGB 10.6* 9.7*  HCT 33* 30*  PLT 461* 481*   Lab Results  Component Value Date   TSH 4.22 09/30/2020   Lab Results  Component Value Date   HGBA1C 6.1 03/09/2021   Lab Results  Component Value Date   CHOL 178 08/30/2012   HDL 49 08/30/2012   LDLCALC 92 08/30/2012   TRIG 187 (H)  08/30/2012   CHOLHDL 3.6 08/30/2012    Significant Diagnostic Results in last 30 days:  No results found.  Assessment/Plan  1. Hypertension associated with diabetes ([place]) -  BPs stable, continue Metoprolol tartrate, Hydralazine and  Losartan  2. Type 2 DM with CKD stage 3 and hypertension ([place]) Lab Results  Component Value Date   HGBA1C 6.1 03/09/2021   -   CBGs stable, continue metformin and CBG checks  3. Mild asthma without complication, unspecified whether persistent -  no wheezing nor SOB, continue Advair and albuterol PRN  4. Seizure disorder ([facility]) -  no recent seizures -    Continue levetiracetam  5. Neurocognitive deficits -  BIMS score 7/15, ranging in severe cognitive impairment -  Continue supportive care    Family/ staff Communication: Discussed plan of care with resident and charge nurse.  Labs/tests ordered: A1c, CBC, CMP and vitamin D  Goals of care:   Long-term care   Durenda Age, DNP, MSN, FNP-BC [facility] [phone] (Monday-Friday 8:00 a.m. - 5:00 p.m.) [phone] (after hours)

## 2021-07-14 LAB — BASIC METABOLIC PANEL
BUN: 27 — AB (ref 4–21)
CO2: 28 — AB (ref 13–22)
Chloride: 105 (ref 99–108)
Creatinine: 1.9 — AB (ref 0.5–1.1)
Glucose: 150
Potassium: 4.1 (ref 3.4–5.3)
Sodium: 145 (ref 137–147)

## 2021-07-14 LAB — CBC: RBC: 3.45 — AB (ref 3.87–5.11)

## 2021-07-14 LAB — CBC AND DIFFERENTIAL
HCT: 29 — AB (ref 36–46)
Hemoglobin: 8.7 — AB (ref 12.0–16.0)
Platelets: 526 — AB (ref 150–399)
WBC: 10.6

## 2021-07-14 LAB — COMPREHENSIVE METABOLIC PANEL
Albumin: 3.3 — AB (ref 3.5–5.0)
Calcium: 9.4 (ref 8.7–10.7)
GFR calc Af Amer: 31.57
GFR calc non Af Amer: 27.24
Globulin: 4.7

## 2021-07-14 LAB — HEPATIC FUNCTION PANEL
ALT: 9 (ref 7–35)
AST: 12 — AB (ref 13–35)
Alkaline Phosphatase: 91 (ref 25–125)

## 2021-07-14 LAB — HEMOGLOBIN A1C: Hemoglobin A1C: 6

## 2021-07-14 LAB — VITAMIN D 25 HYDROXY (VIT D DEFICIENCY, FRACTURES): Vit D, 25-Hydroxy: 41.2

## 2021-07-15 LAB — IRON,TIBC AND FERRITIN PANEL
%SAT: 9.58
Ferritin: 5.5
Iron: 20
TIBC: 212
UIBC: 192

## 2021-07-15 LAB — VITAMIN B12: Vitamin B-12: 567

## 2021-08-06 ENCOUNTER — Encounter: Payer: Self-pay | Admitting: Internal Medicine

## 2021-08-06 ENCOUNTER — Non-Acute Institutional Stay (SKILLED_NURSING_FACILITY): Payer: Medicare Other | Admitting: Internal Medicine

## 2021-08-06 DIAGNOSIS — D352 Benign neoplasm of pituitary gland: Secondary | ICD-10-CM

## 2021-08-06 DIAGNOSIS — E1159 Type 2 diabetes mellitus with other circulatory complications: Secondary | ICD-10-CM | POA: Diagnosis not present

## 2021-08-06 DIAGNOSIS — I739 Peripheral vascular disease, unspecified: Secondary | ICD-10-CM | POA: Diagnosis not present

## 2021-08-06 DIAGNOSIS — I152 Hypertension secondary to endocrine disorders: Secondary | ICD-10-CM

## 2021-08-06 DIAGNOSIS — G40909 Epilepsy, unspecified, not intractable, without status epilepticus: Secondary | ICD-10-CM

## 2021-08-06 DIAGNOSIS — E1169 Type 2 diabetes mellitus with other specified complication: Secondary | ICD-10-CM | POA: Diagnosis not present

## 2021-08-06 NOTE — Progress Notes (Signed)
NURSING HOME LOCATION:  [facility] ROOM NUMBER:  [id number]  CODE STATUS:  Full Code  PCP:  Durenda Age NP,PSC  This is a nursing facility follow up visit of chronic medical diagnoses & to document compliance with Regulation 483.30 (c) in [employer] [place] Manual Phase 2 which mandates caregiver visit ( visits can alternate among physician, PA or NP as per statutes) within 10 days of 30 days / 60 days/ 90 days post admission to SNF date    Interim medical record and care since last SNF visit was updated with review of diagnostic studies and change in clinical status since last visit were documented.  HPI: She is a permanent resident of this facility with medical diagnoses of history of asthma, GERD, essential hypertension, history of migraine headaches, dementia and diabetes with PVD and CKD stage III.  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  Most recent labs were 04/14/2021.  Creatinine was 1.4 with a GFR of 43.46 indicating CKD stage IIIb.  There has been slight progression of anemia with the H/H dropping from 10.6/33 down to 9.7/30.  Most recent A1c was 03/09/2021 with a prediabetic value of 6.1%.  Serially the hemoglobin A1c had remained stable.  Review of systems: Dementia invalidated responses.  She states that the stool urgency she previously had was resolved with "the medicine".  She cannot name that agent.  At this time she is very concerned as she states that her mother, father, and brother died this Christmas season and "no one woke me up to tell me".  I believe that her parents have been dead for an extended period of time.  She also was upset as her license is expired and she was not informed of this. She denies any active GI symptoms at this time and also denies any bleeding dyscrasias.  Despite her concern about "recent" deaths in her family; she denies any  depression.  Constitutional: No fever, significant weight change, fatigue  Eyes: No redness, discharge, pain, vision change ENT/mouth: No nasal congestion,  purulent discharge, earache, change in hearing, sore throat  Cardiovascular: No chest pain, palpitations, paroxysmal nocturnal dyspnea, claudication, edema  Respiratory: No cough, sputum production, hemoptysis, DOE, significant snoring, apnea   Gastrointestinal: No heartburn, dysphagia, abdominal pain, nausea /vomiting, rectal bleeding, melena, change in bowels Genitourinary: No dysuria, hematuria, pyuria, incontinence, nocturia Musculoskeletal: No joint stiffness, joint swelling, weakness, pain Dermatologic: No rash, pruritus, change in appearance of skin Neurologic: No dizziness, headache, syncope, seizures, numbness, tingling Psychiatric: No significant anxiety, depression, insomnia, anorexia Endocrine: No change in hair/skin/nails, excessive thirst, excessive hunger, excessive urination  Hematologic/lymphatic: No significant bruising, lymphadenopathy, abnormal bleeding Allergy/immunology: No itchy/watery eyes, significant sneezing, urticaria, angioedema  Physical exam:  Pertinent or positive findings: When I entered the room she was coming out of the bathroom carrying her land line phone.  Gait was slow and shuffling.  She used the locked wheelchair and other items for support as she walked.  She agreed to the exam but then began to pick up items and rearrange them and remained distracted and unfocused. After repeated requests she finally sat for the interview & exam. Affect is flat and facies blank.  A faint grade 1/2 systolic murmur is suggested.  She has low-grade diffuse rhonchi.  Abdomen is protuberant.  Pedal pulses are decreased.  She has tense edema of the lower extremities from the knees down.  Slight clubbing the nailbeds  is suggested.  Fusiform change of the knees is suggested.  General appearance: Adequately nourished; no  acute distress, increased work of breathing is present.   Lymphatic: No lymphadenopathy about the head, neck, axilla. Eyes: No conjunctival inflammation or lid edema is present. There is no scleral icterus. Ears:  External ear exam shows no significant lesions or deformities.   Nose:  External nasal examination shows no deformity or inflammation. Nasal mucosa are pink and moist without lesions, exudates Oral exam:  Lips and gums are healthy appearing. There is no oropharyngeal erythema or exudate. Neck:  No thyromegaly, masses, tenderness noted.    Heart:  Normal rate and regular rhythm. S1 and S2 normal without gallop, click, rub .  Lungs:  without wheezes,rales, rubs. Abdomen: Bowel sounds are normal. Abdomen is soft and nontender with no organomegaly, hernias, masses. GU: Deferred  Extremities:  No cyanosis  Neurologic exam :Balance, Rhomberg, finger to nose testing could not be completed due to clinical state Skin: Warm & dry w/o tenting. No significant lesions or rash.  See summary under each active problem in the Problem List with associated updated therapeutic plan

## 2021-08-07 NOTE — Assessment & Plan Note (Signed)
No seizures reported by staff

## 2021-08-07 NOTE — Patient Instructions (Signed)
See assessment and plan under each diagnosis in the problem list and acutely for this visit 

## 2021-08-07 NOTE — Assessment & Plan Note (Signed)
Peripheral edema stable to possibly slightly improved. Compression hose not an option with dementia

## 2021-08-07 NOTE — Assessment & Plan Note (Signed)
BP controlled; no change in antihypertensive medications  

## 2021-08-07 NOTE — Assessment & Plan Note (Signed)
Endocrine F/U as per Dr [name]

## 2021-08-26 ENCOUNTER — Encounter: Payer: Self-pay | Admitting: Adult Health

## 2021-08-26 ENCOUNTER — Non-Acute Institutional Stay (SKILLED_NURSING_FACILITY): Payer: Medicare Other | Admitting: Adult Health

## 2021-08-26 DIAGNOSIS — N183 Chronic kidney disease, stage 3 unspecified: Secondary | ICD-10-CM

## 2021-08-26 DIAGNOSIS — E1122 Type 2 diabetes mellitus with diabetic chronic kidney disease: Secondary | ICD-10-CM | POA: Diagnosis not present

## 2021-08-26 DIAGNOSIS — I129 Hypertensive chronic kidney disease with stage 1 through stage 4 chronic kidney disease, or unspecified chronic kidney disease: Secondary | ICD-10-CM | POA: Diagnosis not present

## 2021-08-26 DIAGNOSIS — D508 Other iron deficiency anemias: Secondary | ICD-10-CM | POA: Diagnosis not present

## 2021-08-26 NOTE — Progress Notes (Signed)
Location:  [place] Room Number: 203-A Place of Service:  SNF (31) Provider:  Durenda Age, DNP, FNP-BC  Patient Care Team: Hendricks Limes, MD as PCP - General (Internal Medicine) Medina-Vargas, Senaida Lange, NP as Nurse Practitioner (Internal Medicine)  Extended Emergency Contact Information Primary Emergency Contact: Angelis, Gates Home Phone: [phone] Relation: Sister Secondary Emergency Contact: Sandria Manly Mobile Phone: [phone] Relation: Niece  Code Status:  Full Code  Goals of care: Advanced Directive information Advanced Directives 08/26/2021  Does Patient Have a Medical Advance Directive? No  Type of Advance Directive -  Does patient want to make changes to medical advance directive? No - Patient declined  Would patient like information on creating a medical advance directive? -  Pre-existing out of facility DNR order (yellow form or pink MOST form) -     Chief Complaint  Patient presents with   Acute Visit    Anemia     HPI:  Pt is a 71 y.o. female seen today for an acute visit regarding anemia. She was noted to have hgb 8.7, dropped from hgb 9.7. B12 567, 20, TIBC 007, folic acid 5.5 and ferritin 19.11.  There was no reported bloody stool. CBGs ranging from 82 to 201, with outlier high.  She takes metformin 500 mg 1 tab twice a day for diabetes mellitus. She is a long-term care resident of [facility].   Past Medical History:  Diagnosis Date   Allergic rhinitis    Arthritis    "ankles, feet, knees" (10/30/2017)   Asthma    Bursitis    Carpal tunnel syndrome    GERD (gastroesophageal reflux disease)    HTN (hypertension)    Migraine    "a couple/yr" (10/30/2017)   Obesity    Peripheral vascular disease ([place])    Pneumonia 1960s X 1   Sciatica    Tendinitis    Type II diabetes mellitus ([place])    Type II   UTI (urinary tract infection)    Past Surgical History:  Procedure Laterality Date    CRANIOTOMY N/A 01/19/2018   Procedure: ENDOSCOPIC TRANSPHENOIDAL RESECTION OF PITUITARY TUMOR;  Surgeon: Consuella Lose, MD;  Location: [place];  Service: Neurosurgery;  Laterality: N/A;  ENDOSCOPIC TRANSPHENOIDAL RESECTION OF PITUITARY TUMOR   GANGLION CYST EXCISION Left    KNEE ARTHROSCOPY Left 2003   PLANTAR FASCIA RELEASE Right 1995   "heel"   SINUS ENDO WITH FUSION N/A 01/19/2018   Procedure: SINUS ENDO WITH FUSION;  Surgeon: Jerrell Belfast, MD;  Location: [place];  Service: ENT;  Laterality: N/A;   [place] N/A 01/19/2018   Procedure: TRANSPHENOIDAL  RESECTION OF PITUITARY TUMOR;  Surgeon: Jerrell Belfast, MD;  Location: [place];  Service: ENT;  Laterality: N/A;   [place]; 1997   Dr [name]    Allergies  Allergen Reactions   Penicillins Hives and Other (See Comments)    ++ got keflex in 2013 and 2019++ Has patient had a PCN reaction causing immediate rash, facial/tongue/throat swelling, SOB or lightheadedness with hypotension: No Has patient had a PCN reaction causing severe rash involving mucus membranes or skin necrosis: No PATIENT HAS HAD A PCN REACTION THAT REQUIRED HOSPITALIZATION:  #  #  YES  #  #  Has patient had a PCN reaction occurring within the last 10 years: No    Naproxen     History of GI intolerance with indomethacin.  She is  on PPI for GERD.  NSAIDs should not be used long-term or as maintenance medication due to the comorbidities and age as per Beers' List   Codeine Nausea And Vomiting and Other (See Comments)   Indomethacin Diarrhea    Outpatient Encounter Medications as of 08/26/2021  Medication Sig   acetaminophen (TYLENOL) 650 MG CR tablet Take 650 mg by mouth every 6 (six) hours as needed for pain.   ADVAIR DISKUS 100-50 MCG/DOSE AEPB Inhale 1 puff into the lungs 2 (two) times daily.   albuterol (PROVENTIL HFA;VENTOLIN HFA) 108 (90 Base) MCG/ACT inhaler Inhale 2 puffs into the lungs every 6  (six) hours as needed for wheezing or shortness of breath.   barrier cream (NON-SPECIFIED) CREA Apply 1 application topically 2 (two) times daily as needed.   bisacodyl (DULCOLAX) 10 MG suppository Constipation (2 of 4): If not relieved by MOM, give 10 mg Bisacodyl suppositiory rectally X 1 dose in 24 hours as needed (Do not use constipation standing orders for residents with renal failure/CFR less than 30. Contact MD for orders)   cetaphil (CETAPHIL) cream Apply 1 application topically at bedtime. Apply to bilateral lower legs for dry skin.   Cholecalciferol (VITAMIN D3) 125 MCG (5000 UT) CAPS Take 1 capsule by mouth daily.    esomeprazole (NEXIUM) 20 MG capsule Take 1 capsule (20 mg total) by mouth every morning. For acid reflex   ferrous sulfate (FEROSUL) 325 (65 FE) MG tablet Take 325 mg by mouth in the morning and at bedtime.   furosemide (LASIX) 40 MG tablet TAKE ONE TABLET BY MOUTH EVERY DAY   hydrALAZINE (APRESOLINE) 25 MG tablet Take 25 mg by mouth 3 (three) times daily.    levETIRAcetam (KEPPRA) 500 MG tablet Take 1 tablet (500 mg total) by mouth 2 (two) times daily.   loperamide (IMODIUM A-D) 2 MG tablet Take 2 mg by mouth as needed for diarrhea or loose stools. Give after each stool prn, not to exceed 8 mg in 24 hours   losartan (COZAAR) 100 MG tablet Take 100 mg by mouth daily.   magnesium hydroxide (MILK OF MAGNESIA) 400 MG/5ML suspension Constipation (1 of 4): If no BM in 3 days, give 30 cc Milk of Magnesium p.o. x 1 dose in 24 hours as needed (Do not use standing constipation orders for residents with renal failure CFR less than 30. Contact MD for orders)   metFORMIN (GLUCOPHAGE) 500 MG tablet Take 500 mg by mouth 2 (two) times daily with a meal.   metoprolol succinate (TOPROL-XL) 25 MG 24 hr tablet Take 50 mg by mouth 2 (two) times daily.    mometasone (NASONEX) 50 MCG/ACT nasal spray Place 1 spray into the nose as needed.   promethazine (PHENERGAN) 25 MG tablet Take 25 mg by mouth  every 6 (six) hours as needed for nausea or vomiting.   senna-docusate (SENEXON-S) 8.6-50 MG tablet Take 1 tablet by mouth 2 (two) times daily.   Sodium Phosphates (RA SALINE ENEMA RE) Place rectally as needed.   vitamin C (ASCORBIC ACID) 500 MG tablet Take 500 mg by mouth 2 (two) times daily.   No facility-administered encounter medications on file as of 08/26/2021.    Review of Systems  Constitutional:  Negative for appetite change, chills, fatigue and fever.  HENT:  Negative for congestion, hearing loss, rhinorrhea and sore throat.   Eyes: Negative.   Respiratory:  Negative for cough, shortness of breath and wheezing.   Cardiovascular:  Positive for leg swelling. Negative for  chest pain and palpitations.  Gastrointestinal:  Negative for abdominal pain, constipation, diarrhea, nausea and vomiting.  Genitourinary:  Negative for dysuria.  Skin:  Negative for color change, rash and wound.  Neurological:  Negative for dizziness, weakness and headaches.  Psychiatric/Behavioral:  Negative for behavioral problems. The patient is not nervous/anxious.       Immunization History  Administered Date(s) Administered   Influenza Split 03/08/2013   Influenza Whole 09/03/2009, 06/03/2010   Influenza,inj,Quad PF,6+ Mos 06/25/2014   Influenza-Unspecified 03/25/2018, 05/06/2019, 05/14/2020   Moderna Sars-Covid-2 Vaccination 08/12/2019, 09/09/2019, 07/28/2020   Pneumococcal Polysaccharide-23 07/25/2002, 10/31/2017   Pneumococcal-Unspecified 05/07/2019   Td 07/25/2005   Tdap 05/03/2019   Unspecified SARS-COV-2 Vaccination 07/28/2020   Zoster Recombinat (Shingrix) 06/25/2021   Pertinent  Health Maintenance Due  Topic Date Due   COLONOSCOPY (Pts 45-36yrs Insurance coverage will need to be confirmed)  Never done   MAMMOGRAM  01/14/2017   OPHTHALMOLOGY EXAM  09/08/2019   FOOT EXAM  02/18/2021   INFLUENZA VACCINE  02/22/2021   HEMOGLOBIN A1C  10/12/2021   DEXA SCAN  Completed   Fall Risk  06/03/2018 06/04/2018 06/04/2018 02/21/2019 05/22/2020  Falls in the past year? - - - (No Data) 1  Number of falls in past year - - - Emmi Telephone Survey Actual Response =  -  Was there an injury with Fall? - - - - 0  Was there an injury with Fall? - - - - -  Fall Risk Category Calculator - - - - 2  Fall Risk Category - - - - Moderate  Patient Fall Risk Level High fall risk High fall risk High fall risk - Moderate fall risk  Patient at Risk for Falls Due to - - - - History of fall(s);Impaired balance/gait;Impaired mobility  Fall risk Follow up - - - - Falls evaluation completed;Education provided;Falls prevention discussed     Vitals:   08/26/21 0940  BP: 117/65  Pulse: 82  Resp: 18  Temp: (!) 97.5 F (36.4 C)  SpO2: 93%  Weight: 187 lb 12.8 oz (85.2 kg)  Height: 5\' 1"  (1.549 m)   Body mass index is 35.48 kg/m.  Physical Exam Constitutional:      Appearance: She is obese.  HENT:     Head: Normocephalic and atraumatic.     Nose: Nose normal.     Mouth/Throat:     Mouth: Mucous membranes are moist.  Eyes:     Conjunctiva/sclera: Conjunctivae normal.  Cardiovascular:     Rate and Rhythm: Normal rate and regular rhythm.  Pulmonary:     Effort: Pulmonary effort is normal.     Breath sounds: Normal breath sounds.  Abdominal:     General: Bowel sounds are normal.     Palpations: Abdomen is soft.  Musculoskeletal:        General: Swelling present.     Right lower leg: Edema present.     Left lower leg: Edema present.     Comments: BLE 2+edema  Skin:    General: Skin is warm and dry.  Neurological:     Mental Status: She is alert. Mental status is at baseline. She is disoriented.  Psychiatric:        Mood and Affect: Mood normal.        Behavior: Behavior normal.      Labs reviewed: Recent Labs    03/09/21 0000 04/14/21 0000 07/14/21 0000  NA 137 142 145  K 3.9 4.0 4.1  CL 100 102 105  CO2 24* 31* 28*  BUN 17 16 27*  CREATININE 1.4* 1.4* 1.9*  CALCIUM  9.0 9.6 9.4   Recent Labs    07/14/21 0000  AST 12*  ALT 9  ALKPHOS 91  ALBUMIN 3.3*   Recent Labs    09/30/20 0000 04/14/21 0000 07/14/21 0000  WBC 9.5 9.6 10.6  HGB 10.6* 9.7* 8.7*  HCT 33* 30* 29*  PLT 461* 481* 526*   Lab Results  Component Value Date   TSH 4.22 09/30/2020   Lab Results  Component Value Date   HGBA1C 6.0 07/14/2021   Lab Results  Component Value Date   CHOL 178 08/30/2012   HDL 49 08/30/2012   LDLCALC 92 08/30/2012   TRIG 187 (H) 08/30/2012   CHOLHDL 3.6 08/30/2012    Significant Diagnostic Results in last 30 days:  No results found.  Assessment/Plan  1. Other iron deficiency anemia Lab Results  Component Value Date   HGB 8.7 (A) 07/14/2021    -   dropped from 9.7, will start on ferrous sulfate 325 mg 1 tab twice a day and vitamin C 500 mg 1 tab twice a day for better absorption of iron  2. 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  Labs/tests ordered:   CBC on 09/08/2020    Durenda Age, DNP, MSN, FNP-BC [place] [phone] (Monday-Friday 8:00 a.m. - 5:00 p.m.) [phone] (after hours)

## 2021-09-08 ENCOUNTER — Non-Acute Institutional Stay (SKILLED_NURSING_FACILITY): Payer: Medicare Other | Admitting: Adult Health

## 2021-09-08 ENCOUNTER — Encounter: Payer: Self-pay | Admitting: Adult Health

## 2021-09-08 DIAGNOSIS — Z7189 Other specified counseling: Secondary | ICD-10-CM | POA: Diagnosis not present

## 2021-09-08 DIAGNOSIS — E1122 Type 2 diabetes mellitus with diabetic chronic kidney disease: Secondary | ICD-10-CM | POA: Diagnosis not present

## 2021-09-08 DIAGNOSIS — E1159 Type 2 diabetes mellitus with other circulatory complications: Secondary | ICD-10-CM

## 2021-09-08 DIAGNOSIS — I152 Hypertension secondary to endocrine disorders: Secondary | ICD-10-CM

## 2021-09-08 DIAGNOSIS — N183 Chronic kidney disease, stage 3 unspecified: Secondary | ICD-10-CM

## 2021-09-08 DIAGNOSIS — G40909 Epilepsy, unspecified, not intractable, without status epilepticus: Secondary | ICD-10-CM

## 2021-09-08 DIAGNOSIS — I129 Hypertensive chronic kidney disease with stage 1 through stage 4 chronic kidney disease, or unspecified chronic kidney disease: Secondary | ICD-10-CM

## 2021-09-08 DIAGNOSIS — F02C0 Dementia in other diseases classified elsewhere, severe, without behavioral disturbance, psychotic disturbance, mood disturbance, and anxiety: Secondary | ICD-10-CM

## 2021-09-08 DIAGNOSIS — G300 Alzheimer's disease with early onset: Secondary | ICD-10-CM

## 2021-09-08 LAB — CBC AND DIFFERENTIAL
HCT: 30 — AB (ref 36–46)
Hemoglobin: 9.4 — AB (ref 12.0–16.0)
Neutrophils Absolute: 8.4
Platelets: 507 — AB (ref 150–399)
WBC: 11.7

## 2021-09-08 LAB — CBC: RBC: 3.65 — AB (ref 3.87–5.11)

## 2021-09-08 NOTE — Progress Notes (Signed)
Location:  [place] Room Number: [place]:  SNF (31) Provider:  Durenda Age, DNP, FNP-BC  Patient Care Team: Hendricks Limes, MD as PCP - General (Internal Medicine) Medina-Vargas, Senaida Lange, NP as Nurse Practitioner (Internal Medicine)  Extended Emergency Contact Information Primary Emergency Contact: Annjanette, [place] Home Phone: [phone] Relation: Sister Secondary Emergency Contact: Sandria Manly Mobile Phone: [phone] Relation: Niece  Code Status:  Full Code  Goals of care: Advanced Directive information Advanced Directives 09/08/2021  Does Patient Have a Medical Advance Directive? No  Type of Advance Directive -  Does patient want to make changes to medical advance directive? -  Would patient like information on creating a medical advance directive? -  Pre-existing out of facility DNR order (yellow form or pink MOST form) -     Chief Complaint  Patient presents with   Acute Visit    Care plan meeting    HPI:  Pt is a 71 y.o. female who had a care plan meeting today attended by social worker X2, NP, dietitian, life enrichment and niece, Aniceto Boss, who attended via telephone conference. She was invited to the meeting but declined.  She remains to be full code.  Discussed medications, vital signs and weights. She participates in restorative programs : 1) walking program, 2) dressing and grooming, 3) AROM and transfer. She refused podiatry when  podiatrist was in the building. She prefers to stay in her room for activities. Latest BIMS score 7/15, ranging in severe cognitive impairment and SLUMS score 11/30, ranging in dementia range. The meeting lasted for 20 minutes.   Past Medical History:  Diagnosis Date   Allergic rhinitis    Arthritis    "ankles, feet, knees" (10/30/2017)   Asthma    Bursitis    Carpal tunnel syndrome    GERD (gastroesophageal reflux disease)    HTN (hypertension)    Migraine    "a couple/yr"  (10/30/2017)   Obesity    Peripheral vascular disease ([place])    Pneumonia 1960s X 1   Sciatica    Tendinitis    Type II diabetes mellitus ([place])    Type II   UTI (urinary tract infection)    Past Surgical History:  Procedure Laterality Date   CRANIOTOMY N/A 01/19/2018   Procedure: ENDOSCOPIC TRANSPHENOIDAL RESECTION OF PITUITARY TUMOR;  Surgeon: Consuella Lose, MD;  Location: [place];  Service: Neurosurgery;  Laterality: N/A;  ENDOSCOPIC TRANSPHENOIDAL RESECTION OF PITUITARY TUMOR   GANGLION CYST EXCISION Left    KNEE ARTHROSCOPY Left 2003   PLANTAR FASCIA RELEASE Right 1995   "heel"   SINUS ENDO WITH FUSION N/A 01/19/2018   Procedure: SINUS ENDO WITH FUSION;  Surgeon: Jerrell Belfast, MD;  Location: [place];  Service: ENT;  Laterality: N/A;   [place] N/A 01/19/2018   Procedure: TRANSPHENOIDAL  RESECTION OF PITUITARY TUMOR;  Surgeon: Jerrell Belfast, MD;  Location: [place];  Service: ENT;  Laterality: N/A;   [place]; 1997   Dr [name]    Allergies  Allergen Reactions   Penicillins Hives and Other (See Comments)    ++ got keflex in 2013 and 2019++ Has patient had a PCN reaction causing immediate rash, facial/tongue/throat swelling, SOB or lightheadedness with hypotension: No Has patient had a PCN reaction causing severe rash involving mucus membranes or skin necrosis: No PATIENT HAS HAD A PCN REACTION THAT REQUIRED HOSPITALIZATION:  #  #  YES  #  #  Has patient had a PCN reaction occurring within the last 10 years: No    Naproxen     History of GI intolerance with indomethacin.  She is on PPI for GERD.  NSAIDs should not be used long-term or as maintenance medication due to the comorbidities and age as per Beers' List   Codeine Nausea And Vomiting and Other (See Comments)   Indomethacin Diarrhea    Outpatient Encounter Medications as of 09/08/2021  Medication Sig   acetaminophen (TYLENOL) 650 MG CR tablet Take 650 mg  by mouth every 6 (six) hours as needed for pain.   ADVAIR DISKUS 100-50 MCG/DOSE AEPB Inhale 1 puff into the lungs 2 (two) times daily.   albuterol (PROVENTIL HFA;VENTOLIN HFA) 108 (90 Base) MCG/ACT inhaler Inhale 2 puffs into the lungs every 6 (six) hours as needed for wheezing or shortness of breath.   barrier cream (NON-SPECIFIED) CREA Apply 1 application topically 2 (two) times daily as needed.   bisacodyl (DULCOLAX) 10 MG suppository Constipation (2 of 4): If not relieved by MOM, give 10 mg Bisacodyl suppositiory rectally X 1 dose in 24 hours as needed (Do not use constipation standing orders for residents with renal failure/CFR less than 30. Contact MD for orders)   cetaphil (CETAPHIL) cream Apply 1 application topically at bedtime. Apply to bilateral lower legs for dry skin.   Cholecalciferol (VITAMIN D3) 125 MCG (5000 UT) CAPS Take 1 capsule by mouth daily.    esomeprazole (NEXIUM) 20 MG capsule Take 1 capsule (20 mg total) by mouth every morning. For acid reflex   ferrous sulfate 325 (65 FE) MG tablet Take 325 mg by mouth in the morning and at bedtime.   furosemide (LASIX) 40 MG tablet TAKE ONE TABLET BY MOUTH EVERY DAY   hydrALAZINE (APRESOLINE) 25 MG tablet Take 25 mg by mouth 3 (three) times daily.    levETIRAcetam (KEPPRA) 500 MG tablet Take 1 tablet (500 mg total) by mouth 2 (two) times daily.   loperamide (IMODIUM A-D) 2 MG tablet Take 2 mg by mouth as needed for diarrhea or loose stools. Give after each stool prn, not to exceed 8 mg in 24 hours   losartan (COZAAR) 100 MG tablet Take 100 mg by mouth daily.   magnesium hydroxide (MILK OF MAGNESIA) 400 MG/5ML suspension Constipation (1 of 4): If no BM in 3 days, give 30 cc Milk of Magnesium p.o. x 1 dose in 24 hours as needed (Do not use standing constipation orders for residents with renal failure CFR less than 30. Contact MD for orders)   metFORMIN (GLUCOPHAGE) 500 MG tablet Take 500 mg by mouth 2 (two) times daily with a meal.    metoprolol succinate (TOPROL-XL) 50 MG 24 hr tablet Take 50 mg by mouth 2 (two) times daily.    mometasone (NASONEX) 50 MCG/ACT nasal spray Place 1 spray into the nose as needed.   promethazine (PHENERGAN) 25 MG tablet Take 25 mg by mouth every 6 (six) hours as needed for nausea or vomiting.   senna-docusate (SENOKOT-S) 8.6-50 MG tablet Take 1 tablet by mouth 2 (two) times daily.   Sodium Phosphates (RA SALINE ENEMA RE) Place rectally as needed.   vitamin C (ASCORBIC ACID) 500 MG tablet Take 500 mg by mouth 2 (two) times daily.   No facility-administered encounter medications on file as of 09/08/2021.    Review of Systems  Constitutional:  Negative for appetite change, chills, fatigue and fever.  HENT:  Negative for congestion, hearing loss, rhinorrhea  and sore throat.   Eyes: Negative.   Respiratory:  Negative for cough, shortness of breath and wheezing.   Cardiovascular:  Positive for leg swelling. Negative for chest pain and palpitations.  Gastrointestinal:  Negative for abdominal pain, constipation, diarrhea, nausea and vomiting.  Genitourinary:  Negative for dysuria.  Musculoskeletal:  Negative for arthralgias, back pain and myalgias.  Skin:  Negative for color change, rash and wound.  Neurological:  Negative for dizziness, weakness and headaches.  Psychiatric/Behavioral:  Negative for behavioral problems. The patient is not nervous/anxious.       Immunization History  Administered Date(s) Administered   Fluad Quad(high Dose 65+) 05/04/2021   Influenza Split 03/08/2013   Influenza Whole 09/03/2009, 06/03/2010   Influenza,inj,Quad PF,6+ Mos 06/25/2014   Influenza-Unspecified 03/25/2018, 05/06/2019, 05/14/2020   Moderna Sars-Covid-2 Vaccination 08/12/2019, 09/09/2019, 07/28/2020   Pneumococcal Polysaccharide-23 07/25/2002, 10/31/2017   Pneumococcal-Unspecified 05/07/2019   Td 07/25/2005   Tdap 05/03/2019   Unspecified SARS-COV-2 Vaccination 07/28/2020   Zoster Recombinat  (Shingrix) 06/25/2021   Pertinent  Health Maintenance Due  Topic Date Due   COLONOSCOPY (Pts 45-88yrs Insurance coverage will need to be confirmed)  Never done   MAMMOGRAM  01/14/2017   OPHTHALMOLOGY EXAM  09/08/2019   FOOT EXAM  01/31/2022 (Originally 02/18/2021)   HEMOGLOBIN A1C  10/12/2021   INFLUENZA VACCINE  Completed   DEXA SCAN  Completed   Fall Risk 06/03/2018 06/04/2018 06/04/2018 02/21/2019 05/22/2020  Falls in the past year? - - - (No Data) 1  Number of falls in past year - - - Emmi Telephone Survey Actual Response =  -  Was there an injury with Fall? - - - - 0  Was there an injury with Fall? - - - - -  Fall Risk Category Calculator - - - - 2  Fall Risk Category - - - - Moderate  Patient Fall Risk Level High fall risk High fall risk High fall risk - Moderate fall risk  Patient at Risk for Falls Due to - - - - History of fall(s);Impaired balance/gait;Impaired mobility  Fall risk Follow up - - - - Falls evaluation completed;Education provided;Falls prevention discussed     Vitals:   09/08/21 0953  BP: 122/72  Pulse: 82  Resp: 18  Temp: (!) 97.3 F (36.3 C)  Weight: 183 lb 6.4 oz (83.2 kg)  Height: 5\' 1"  (1.549 m)   Body mass index is 34.65 kg/m.  Physical Exam Constitutional:      General: She is not in acute distress.    Appearance: She is obese.  HENT:     Head: Normocephalic and atraumatic.     Nose: Nose normal.     Mouth/Throat:     Mouth: Mucous membranes are moist.  Eyes:     Conjunctiva/sclera: Conjunctivae normal.  Cardiovascular:     Rate and Rhythm: Normal rate and regular rhythm.  Pulmonary:     Effort: Pulmonary effort is normal.     Breath sounds: Normal breath sounds.  Abdominal:     General: Bowel sounds are normal.     Palpations: Abdomen is soft.  Musculoskeletal:        General: No swelling.     Cervical back: Normal range of motion.     Right lower leg: Edema present.     Left lower leg: Edema present.     Comments: BLE 2+edema.   Skin:    General: Skin is warm and dry.  Neurological:     General: No focal deficit  present.     Mental Status: She is alert. She is disoriented.     Comments: Alert to self, disoriented to time and place.  Psychiatric:        Mood and Affect: Mood normal.        Behavior: Behavior normal.        Thought Content: Thought content normal.        Judgment: Judgment normal.       Labs reviewed: Recent Labs    03/09/21 0000 04/14/21 0000 07/14/21 0000  NA 137 142 145  K 3.9 4.0 4.1  CL 100 102 105  CO2 24* 31* 28*  BUN 17 16 27*  CREATININE 1.4* 1.4* 1.9*  CALCIUM 9.0 9.6 9.4   Recent Labs    07/14/21 0000  AST 12*  ALT 9  ALKPHOS 91  ALBUMIN 3.3*   Recent Labs    04/14/21 0000 07/14/21 0000 09/08/21 0000  WBC 9.6 10.6 11.7  NEUTROABS  --   --  8.40  HGB 9.7* 8.7* 9.4*  HCT 30* 29* 30*  PLT 481* 526* 507*   Lab Results  Component Value Date   TSH 4.22 09/30/2020   Lab Results  Component Value Date   HGBA1C 6.0 07/14/2021   Lab Results  Component Value Date   CHOL 178 08/30/2012   HDL 49 08/30/2012   LDLCALC 92 08/30/2012   TRIG 187 (H) 08/30/2012   CHOLHDL 3.6 08/30/2012    Significant Diagnostic Results in last 30 days:  No results found.  Assessment/Plan  1. ACP (advance care planning) -   Remains to be full code -    Discussed medications, vital signs and daily weights  2. Seizure disorder ([place]) -No recent seizures, continue Keppra  3. Type 2 DM with CKD stage 3 and hypertension ([place]) Lab Results  Component Value Date   HGBA1C 6.0 07/14/2021   -  CBGs stable, continue metformin  4. Hypertension associated with diabetes ([place]) -Blood pressure well controlled Continue current medications  5. 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  Labs/tests ordered:   None    Durenda Age, DNP, MSN, FNP-BC [facility] [phone] (Monday-Friday 8:00 a.m. - 5:00 p.m.) [phone] (after hours)

## 2021-09-13 ENCOUNTER — Non-Acute Institutional Stay (INDEPENDENT_AMBULATORY_CARE_PROVIDER_SITE_OTHER): Payer: Medicare Other | Admitting: Adult Health

## 2021-09-13 ENCOUNTER — Encounter: Payer: Self-pay | Admitting: Adult Health

## 2021-09-13 DIAGNOSIS — Z0000 Encounter for general adult medical examination without abnormal findings: Secondary | ICD-10-CM

## 2021-09-13 NOTE — Progress Notes (Signed)
Subjective:   Mariah [name] is a 71 y.o. female who presents for Medicare Annual (Subsequent) preventive examination.  Review of Systems    Cardiac Risk Factors include: advanced age (>34men, >57 women);diabetes mellitus;hypertension;obesity (BMI >30kg/m2)     Objective:    Today's Vitals   09/13/21 1018  BP: 123/61  Pulse: 99  Resp: 20  Temp: 97.7 F (36.5 C)  SpO2: 99%  Weight: 183 lb 6.4 oz (83.2 kg)  Height: 5\' 1"  (1.549 m)   Body mass index is 34.65 kg/m.  Advanced Directives 09/13/2021 09/08/2021 08/26/2021 07/05/2021 06/04/2021 04/13/2021 03/15/2021  Does Patient Have a Medical Advance Directive? No No No No No No Yes  Type of Advance Directive - - - - - - -  Does patient want to make changes to medical advance directive? No - Patient declined - No - Patient declined No - Patient declined No - Patient declined No - Patient declined No - Patient declined  Would patient like information on creating a medical advance directive? - - - - - - -  Pre-existing out of facility DNR order (yellow form or pink MOST form) - - - - - - -    Current Medications (verified) Outpatient Encounter Medications as of 09/13/2021  Medication Sig   acetaminophen (TYLENOL) 650 MG CR tablet Take 650 mg by mouth every 6 (six) hours as needed for pain.   ADVAIR DISKUS 100-50 MCG/DOSE AEPB Inhale 1 puff into the lungs 2 (two) times daily.   albuterol (PROVENTIL HFA;VENTOLIN HFA) 108 (90 Base) MCG/ACT inhaler Inhale 2 puffs into the lungs every 6 (six) hours as needed for wheezing or shortness of breath.   barrier cream (NON-SPECIFIED) CREA Apply 1 application topically 2 (two) times daily as needed.   bisacodyl (DULCOLAX) 10 MG suppository Constipation (2 of 4): If not relieved by MOM, give 10 mg Bisacodyl suppositiory rectally X 1 dose in 24 hours as needed (Do not use constipation standing orders for residents with renal failure/CFR less than 30. Contact MD for orders)   cetaphil (CETAPHIL) cream  Apply 1 application topically at bedtime. Apply to bilateral lower legs for dry skin.   Cholecalciferol (VITAMIN D3) 125 MCG (5000 UT) CAPS Take 1 capsule by mouth daily.    esomeprazole (NEXIUM) 20 MG capsule Take 1 capsule (20 mg total) by mouth every morning. For acid reflex   ferrous sulfate 325 (65 FE) MG tablet Take 325 mg by mouth in the morning and at bedtime.   furosemide (LASIX) 40 MG tablet TAKE ONE TABLET BY MOUTH EVERY DAY   hydrALAZINE (APRESOLINE) 25 MG tablet Take 25 mg by mouth 3 (three) times daily.    levETIRAcetam (KEPPRA) 500 MG tablet Take 1 tablet (500 mg total) by mouth 2 (two) times daily.   loperamide (IMODIUM A-D) 2 MG tablet Take 2 mg by mouth as needed for diarrhea or loose stools. Give after each stool prn, not to exceed 8 mg in 24 hours   losartan (COZAAR) 100 MG tablet Take 100 mg by mouth daily.   magnesium hydroxide (MILK OF MAGNESIA) 400 MG/5ML suspension Constipation (1 of 4): If no BM in 3 days, give 30 cc Milk of Magnesium p.o. x 1 dose in 24 hours as needed (Do not use standing constipation orders for residents with renal failure CFR less than 30. Contact MD for orders)   metFORMIN (GLUCOPHAGE) 500 MG tablet Take 500 mg by mouth 2 (two) times daily with a meal.   metoprolol  succinate (TOPROL-XL) 50 MG 24 hr tablet Take 50 mg by mouth 2 (two) times daily.    mometasone (NASONEX) 50 MCG/ACT nasal spray Place 1 spray into the nose as needed.   NON FORMULARY DIET: REGULAR, THIN LIQUIDS   promethazine (PHENERGAN) 25 MG tablet Take 25 mg by mouth every 6 (six) hours as needed for nausea or vomiting.   senna-docusate (SENOKOT-S) 8.6-50 MG tablet Take 1 tablet by mouth 2 (two) times daily.   Sodium Phosphates (RA SALINE ENEMA RE) Place rectally as needed.   vitamin C (ASCORBIC ACID) 500 MG tablet Take 500 mg by mouth 2 (two) times daily.   No facility-administered encounter medications on file as of 09/13/2021.    Allergies (verified) Penicillins, Naproxen,  Codeine, and Indomethacin   History: Past Medical History:  Diagnosis Date   Allergic rhinitis    Arthritis    "ankles, feet, knees" (10/30/2017)   Asthma    Bursitis    Carpal tunnel syndrome    GERD (gastroesophageal reflux disease)    HTN (hypertension)    Migraine    "a couple/yr" (10/30/2017)   Obesity    Peripheral vascular disease (HCC)    Pneumonia 1960s X 1   Sciatica    Tendinitis    Type II diabetes mellitus ([place])    Type II   UTI (urinary tract infection)    Past Surgical History:  Procedure Laterality Date   CRANIOTOMY N/A 01/19/2018   Procedure: ENDOSCOPIC TRANSPHENOIDAL RESECTION OF PITUITARY TUMOR;  Surgeon: Consuella Lose, MD;  Location: [facility];  Service: Neurosurgery;  Laterality: N/A;  ENDOSCOPIC TRANSPHENOIDAL RESECTION OF PITUITARY TUMOR   GANGLION CYST EXCISION Left    KNEE ARTHROSCOPY Left 2003   PLANTAR FASCIA RELEASE Right 1995   "heel"   SINUS ENDO WITH FUSION N/A 01/19/2018   Procedure: SINUS ENDO WITH FUSION;  Surgeon: Jerrell Belfast, MD;  Location: [facility];  Service: ENT;  Laterality: N/A;   [place] N/A 01/19/2018   Procedure: TRANSPHENOIDAL  RESECTION OF PITUITARY TUMOR;  Surgeon: Jerrell Belfast, MD;  Location: [facility] OR;  Service: ENT;  Laterality: N/A;   [place]; 1997   Dr [name]   Family History  Problem Relation Age of Onset   Uterine cancer Mother    Hypertension Mother    Heart disease Mother    Hypertension Father    Alzheimer's disease Father    Cancer Other    Social History   Socioeconomic History   Marital status: Single    Spouse name: Not on file   Number of children: 0   Years of education: Not on file   Highest education level: Not on file  Occupational History   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, 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: UNEMPLOYED  Tobacco Use   Smoking status: Never   Smokeless tobacco: Never  Vaping Use    Vaping Use: Never used  Substance and Sexual Activity   Alcohol use: No   Drug use: No   Sexual activity: Not Currently    Birth control/protection: None  Other Topics Concern   Not on file  Social History Narrative   Does not speak to brother   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 bring in CBG monitor from home?: Yes Glucose Meter Downloaded?: Yes  How often do you need to have someone help you when you read instructions, pamphlets, or other written materials from your doctor or pharmacy?: 4 - Often What is the last grade level you completed in school?: 2 Masteral degrees in counseling and ( can't remember the other one) and BS in Psychology  Diabetic? Yes  Interpreter Needed?: No  Information entered by :: Alby Schwabe Medina-Vargas DNP   Activities of Daily Living In your present state of health, do you have any difficulty performing the following activities: 09/13/2021  Hearing? N  Vision? N  Difficulty concentrating or making decisions? Y  Walking or climbing stairs? Y  Dressing or bathing? N  Doing errands, shopping? Y  Preparing Food and eating ? Y  Using the Toilet? N  In the past six months, have you accidently leaked urine? Y  Do you have problems with loss of bowel control? N  Managing your Medications? Y  Managing your Finances? Y  Housekeeping or managing your Housekeeping? Y  Some recent data might be hidden    Patient  Care Team: Hendricks Limes, MD as PCP - General (Internal Medicine) Medina-Vargas, Senaida Lange, NP as Nurse Practitioner (Internal Medicine)  Indicate any recent Medical Services you may have received from other than [facility] providers in the past year (date may be approximate).     Assessment:   This is a routine wellness examination for Pence.  Hearing/Vision screen No results found.  Dietary issues and exercise activities discussed: Current Exercise Habits: Structured exercise class, Type of exercise: strength training/weights, Time (Minutes): 10, Frequency (Times/Week): 3, Weekly Exercise (Minutes/Week): 30, Exercise limited by: neurologic condition(s)   Goals Addressed               This Visit's Progress     Exercise 3x per week (30 min per time) (pt-stated)         1.  Use the bike everyday in the gym. 2.   Get out of the room everyday. 3.   Do AROM on all 4 extremities while sitting on wheelchair.      Depression Screen PHQ 2/9 Scores 09/13/2021 05/22/2020 03/06/2018 10/20/2017 09/01/2017 08/15/2017 07/28/2017  PHQ - 2 Score 0 1 0 0 0 0 1    Fall Risk Fall Risk  09/13/2021 05/22/2020 02/21/2019 03/06/2018 10/20/2017  Falls in the past year? 0 1 (No Data) Yes Yes  Comment - - [facility] Telephone Survey: data to providers prior to load - -  Number falls in past yr: 0 1 (No Data) 1 2 or more  Comment - - [facility] Telephone Survey Actual Response =  - -  Injury with Fall? 0 0 - Yes Yes  Comment - - - - Broken jaw  Risk Factor Category  - - - High Fall Risk High Fall Risk  Risk for fall due to : Impaired balance/gait History of fall(s);Impaired balance/gait;Impaired mobility - Impaired balance/gait History of fall(s);Impaired mobility;Impaired balance/gait  Follow up Education provided;Falls prevention discussed Falls evaluation completed;Education provided;Falls prevention discussed -  Falls prevention discussed -    FALL RISK PREVENTION PERTAINING TO THE HOME:  Any stairs in or around the  home? No  If so, are there any without handrails? No  Home free of loose throw rugs in walkways, pet beds, electrical cords, etc? Yes  Adequate lighting in your home to reduce risk of falls? Yes   ASSISTIVE DEVICES UTILIZED TO PREVENT FALLS:  Life alert? No  Use of a cane, walker or w/c? Yes  Grab bars in the bathroom? Yes  Shower chair or bench in shower? Yes  Elevated toilet seat or a handicapped toilet? Yes   TIMED UP AND GO:  Was the test performed? No .  Length of time to ambulate 10 feet: N/A sec.   Gait unsteady with use of assistive device, provider informed and education provided.   Cognitive Function: MMSE - Mini Mental State Exam 09/13/2021  Orientation to time 4  Orientation to Place 1  Registration 3  Attention/ Calculation 3  Recall 3  Language- name 2 objects 2  Language- repeat 1  Language- follow 3 step command 3  Language- read & follow direction 1  Write a sentence 1  Copy design 1  Total score 23     6CIT Screen 09/13/2021 05/22/2020  What Year? 4 points 4 points  What month? 0 points 0 points  What time? 0 points 0 points  Count back from 20 4 points 4 points  Months in reverse 4 points 4 points  Repeat phrase 0 points 10 points  Total Score 12 22    Immunizations Immunization History  Administered Date(s) Administered   Fluad Quad(high Dose 65+) 05/04/2021   Influenza Split 03/08/2013   Influenza Whole 09/03/2009, 06/03/2010   Influenza,inj,Quad PF,6+ Mos 06/25/2014   Influenza-Unspecified 03/25/2018, 05/06/2019, 05/14/2020   Moderna SARS-COV2 Booster Vaccination 07/22/2021   Moderna Sars-Covid-2 Vaccination 08/12/2019, 09/09/2019, 07/28/2020   Pneumococcal Polysaccharide-23 07/25/2002, 10/31/2017   Pneumococcal-Unspecified 05/07/2019   Td 07/25/2005   Tdap 05/03/2019   Unspecified SARS-COV-2 Vaccination 07/28/2020   Zoster Recombinat (Shingrix) 06/25/2021    TDAP status: Up to date  Flu Vaccine status: Up to date  Pneumococcal  vaccine status: Declined,  Education has been provided regarding the importance of this vaccine but patient still declined. Advised may receive this vaccine at local pharmacy or Health Dept. Aware to provide a copy of the vaccination record if obtained from local pharmacy or Health Dept. Verbalized acceptance and understanding.   Covid-19 vaccine status: Information provided on how to obtain vaccines.   Qualifies for Shingles Vaccine? Yes   Zostavax completed No   Shingrix Completed?: No.    Education has been provided regarding the importance of this vaccine. Patient has been advised to call insurance company to determine out of pocket expense if they have not yet received this vaccine. Advised may also receive vaccine at local pharmacy or Health Dept. Verbalized acceptance and understanding.  Screening Tests Health Maintenance  Topic Date Due   COLONOSCOPY (Pts 45-78yrs Insurance coverage will need to be confirmed)  Never done   MAMMOGRAM  01/14/2017   OPHTHALMOLOGY EXAM  09/08/2019   Pneumonia Vaccine 64+ Years old (2 - PCV) 05/06/2020   Zoster Vaccines- Shingrix (2 of 2) 08/20/2021   COVID-19 Vaccine (5 - Booster for Moderna series) 09/16/2021   FOOT EXAM  01/31/2022 (Originally 02/18/2021)   HEMOGLOBIN A1C  10/12/2021   TETANUS/TDAP  05/02/2029   INFLUENZA VACCINE  Completed   DEXA SCAN  Completed   Hepatitis  C Screening  Completed   HPV VACCINES  Aged Out    Health Maintenance  Health Maintenance Due  Topic Date Due   COLONOSCOPY (Pts 45-73yrs Insurance coverage will need to be confirmed)  Never done   MAMMOGRAM  01/14/2017   OPHTHALMOLOGY EXAM  09/08/2019   Pneumonia Vaccine 83+ Years old (2 - PCV) 05/06/2020   Zoster Vaccines- Shingrix (2 of 2) 08/20/2021   COVID-19 Vaccine (5 - Booster for Moderna series) 09/16/2021    Colorectal cancer screening: Type of screening: Colonoscopy. Completed Refused. Repeat every N/A years  Mammogram status: Ordered Refused. Pt provided  with contact info and advised to call to schedule appt.   Bone Density status: Ordered Refused. Pt provided with contact info and advised to call to schedule appt.  Lung Cancer Screening: (Low Dose CT Chest recommended if Age 69-80 years, 30 pack-year currently smoking OR have quit w/in 15years.) does not qualify.   Lung Cancer Screening Referral: N/A  Additional Screening:  Hepatitis C Screening: does qualify; Completed 06/13/21  Vision Screening: Recommended annual ophthalmology exams for early detection of glaucoma and other disorders of the eye. Is the patient up to date with their annual eye exam?  No  Who is the provider or what is the name of the office in which the patient attends annual eye exams? [address] If pt is not established with a provider, would they like to be referred to a provider to establish care? Yes .   Dental Screening: Recommended annual dental exams for proper oral hygiene  Community Resource Referral / Chronic Care Management: CRR required this visit?  No   CCM required this visit?  No      Plan:     I have personally reviewed and noted the following in the patients chart:   Medical and social history Use of alcohol, tobacco or illicit drugs  Current medications and supplements including opioid prescriptions.  Functional ability and status Nutritional status Physical activity Advanced directives List of other physicians Hospitalizations, surgeries, and ER visits in previous 12 months Vitals Screenings to include cognitive, depression, and falls Referrals and appointments  In addition, I have reviewed and discussed with patient certain preventive protocols, quality metrics, and best practice recommendations. A written personalized care plan for preventive services as well as general preventive health recommendations were provided to patient.     Durenda Age, NP   09/13/2021   Nurse Notes: Will need Annual Wellness Visit  yearly.

## 2021-09-30 ENCOUNTER — Non-Acute Institutional Stay (SKILLED_NURSING_FACILITY): Payer: Medicare Other | Admitting: Adult Health

## 2021-09-30 ENCOUNTER — Encounter: Payer: Self-pay | Admitting: Adult Health

## 2021-09-30 DIAGNOSIS — N183 Chronic kidney disease, stage 3 unspecified: Secondary | ICD-10-CM

## 2021-09-30 DIAGNOSIS — D508 Other iron deficiency anemias: Secondary | ICD-10-CM

## 2021-09-30 DIAGNOSIS — G40909 Epilepsy, unspecified, not intractable, without status epilepticus: Secondary | ICD-10-CM | POA: Diagnosis not present

## 2021-09-30 DIAGNOSIS — E1122 Type 2 diabetes mellitus with diabetic chronic kidney disease: Secondary | ICD-10-CM

## 2021-09-30 DIAGNOSIS — I129 Hypertensive chronic kidney disease with stage 1 through stage 4 chronic kidney disease, or unspecified chronic kidney disease: Secondary | ICD-10-CM

## 2021-09-30 DIAGNOSIS — J45909 Unspecified asthma, uncomplicated: Secondary | ICD-10-CM

## 2021-09-30 NOTE — Progress Notes (Unsigned)
Location:  [facility] Room Number: 203-A Place of Service:  SNF (31) Provider:  Durenda Age, DNP, FNP-BC  Patient Care Team: Hendricks Limes, MD as PCP - General (Internal Medicine) Medina-Vargas, Senaida Lange, NP as Nurse Practitioner (Internal Medicine)  Extended Emergency Contact Information Primary Emergency Contact: Mignonne, Afonso Home Phone: [phone] Relation: Sister Secondary Emergency Contact: Sandria Manly Mobile Phone: [phone] Relation: Niece  Code Status:  FULL CODE  Goals of care: Advanced Directive information Advanced Directives 09/30/2021  Does Patient Have a Medical Advance Directive? No  Type of Advance Directive -  Does patient want to make changes to medical advance directive? No - Patient declined  Would patient like information on creating a medical advance directive? -  Pre-existing out of facility DNR order (yellow form or pink MOST form) -     Chief Complaint  Patient presents with   Medical Management of Chronic Issues    Routine Visit.   Health Maintenance    Discuss the need for Eye exam.   Immunizations    Discuss the need for Covid Booster.    HPI:  Pt is a 71 y.o. female seen today for medical management of chronic diseases.  ***   Past Medical History:  Diagnosis Date   Allergic rhinitis    Arthritis    "ankles, feet, knees" (10/30/2017)   Asthma    Bursitis    Carpal tunnel syndrome    GERD (gastroesophageal reflux disease)    HTN (hypertension)    Migraine    "a couple/yr" (10/30/2017)   Obesity    Peripheral vascular disease ([place])    Pneumonia 1960s X 1   Sciatica    Tendinitis    Type II diabetes mellitus ([place])    Type II   UTI (urinary tract infection)    Past Surgical History:  Procedure Laterality Date   CRANIOTOMY N/A 01/19/2018   Procedure: ENDOSCOPIC TRANSPHENOIDAL RESECTION OF PITUITARY TUMOR;  Surgeon: Consuella Lose, MD;  Location: [facility];  Service: Neurosurgery;   Laterality: N/A;  ENDOSCOPIC TRANSPHENOIDAL RESECTION OF PITUITARY TUMOR   GANGLION CYST EXCISION Left    KNEE ARTHROSCOPY Left 2003   PLANTAR FASCIA RELEASE Right 1995   "heel"   SINUS ENDO WITH FUSION N/A 01/19/2018   Procedure: SINUS ENDO WITH FUSION;  Surgeon: Jerrell Belfast, MD;  Location: [facility];  Service: ENT;  Laterality: N/A;   [place] N/A 01/19/2018   Procedure: TRANSPHENOIDAL  RESECTION OF PITUITARY TUMOR;  Surgeon: Jerrell Belfast, MD;  Location: [facility];  Service: ENT;  Laterality: N/A;   [place]; 1997   Dr [name]    Allergies  Allergen Reactions   Penicillins Hives and Other (See Comments)    ++ got keflex in 2013 and 2019++ Has patient had a PCN reaction causing immediate rash, facial/tongue/throat swelling, SOB or lightheadedness with hypotension: No Has patient had a PCN reaction causing severe rash involving mucus membranes or skin necrosis: No PATIENT HAS HAD A PCN REACTION THAT REQUIRED HOSPITALIZATION:  #  #  YES  #  #  Has patient had a PCN reaction occurring within the last 10 years: No    Naproxen     History of GI intolerance with indomethacin.  She is on PPI for GERD.  NSAIDs should not be used long-term or as maintenance medication due to the comorbidities and age as per Beers' List   Codeine Nausea And Vomiting and Other (  See Comments)   Indomethacin Diarrhea    Outpatient Encounter Medications as of 09/30/2021  Medication Sig   acetaminophen (TYLENOL) 650 MG CR tablet Take 650 mg by mouth every 6 (six) hours as needed for pain.   ADVAIR DISKUS 100-50 MCG/DOSE AEPB Inhale 1 puff into the lungs 2 (two) times daily.   albuterol (PROVENTIL HFA;VENTOLIN HFA) 108 (90 Base) MCG/ACT inhaler Inhale 2 puffs into the lungs every 6 (six) hours as needed for wheezing or shortness of breath.   barrier cream (NON-SPECIFIED) CREA Apply 1 application topically 2 (two) times daily as needed.   bisacodyl  (DULCOLAX) 10 MG suppository Constipation (2 of 4): If not relieved by MOM, give 10 mg Bisacodyl suppositiory rectally X 1 dose in 24 hours as needed (Do not use constipation standing orders for residents with renal failure/CFR less than 30. Contact MD for orders)   cetaphil (CETAPHIL) cream Apply 1 application topically at bedtime. Apply to bilateral lower legs for dry skin.   Cholecalciferol (VITAMIN D3) 125 MCG (5000 UT) CAPS Take 1 capsule by mouth daily.    esomeprazole (NEXIUM) 20 MG capsule Take 1 capsule (20 mg total) by mouth every morning. For acid reflex   ferrous sulfate 325 (65 FE) MG tablet Take 325 mg by mouth in the morning and at bedtime.   furosemide (LASIX) 40 MG tablet TAKE ONE TABLET BY MOUTH EVERY DAY   hydrALAZINE (APRESOLINE) 25 MG tablet Take 25 mg by mouth 3 (three) times daily.    levETIRAcetam (KEPPRA) 500 MG tablet Take 1 tablet (500 mg total) by mouth 2 (two) times daily.   loperamide (IMODIUM A-D) 2 MG tablet Take 2 mg by mouth as needed for diarrhea or loose stools. Give after each stool prn, not to exceed 8 mg in 24 hours   losartan (COZAAR) 100 MG tablet Take 100 mg by mouth daily.   magnesium hydroxide (MILK OF MAGNESIA) 400 MG/5ML suspension Constipation (1 of 4): If no BM in 3 days, give 30 cc Milk of Magnesium p.o. x 1 dose in 24 hours as needed (Do not use standing constipation orders for residents with renal failure CFR less than 30. Contact MD for orders)   metFORMIN (GLUCOPHAGE) 500 MG tablet Take 500 mg by mouth 2 (two) times daily with a meal.   metoprolol succinate (TOPROL-XL) 50 MG 24 hr tablet Take 50 mg by mouth 2 (two) times daily.    mometasone (NASONEX) 50 MCG/ACT nasal spray Place 1 spray into the nose as needed.   NON FORMULARY DIET: REGULAR, THIN LIQUIDS   promethazine (PHENERGAN) 25 MG tablet Take 25 mg by mouth every 6 (six) hours as needed for nausea or vomiting.   senna-docusate (SENOKOT-S) 8.6-50 MG tablet Take 1 tablet by mouth 2 (two) times  daily.   Sodium Phosphates (RA SALINE ENEMA RE) Place rectally as needed.   vitamin C (ASCORBIC ACID) 500 MG tablet Take 500 mg by mouth 2 (two) times daily.   No facility-administered encounter medications on file as of 09/30/2021.    Review of Systems ***    Immunization History  Administered Date(s) Administered   Fluad Quad(high Dose 65+) 05/04/2021   Influenza Split 03/08/2013   Influenza Whole 09/03/2009, 06/03/2010   Influenza,inj,Quad PF,6+ Mos 06/25/2014   Influenza-Unspecified 03/25/2018, 05/06/2019, 05/14/2020   Moderna SARS-COV2 Booster Vaccination 07/22/2021   Moderna Sars-Covid-2 Vaccination 08/12/2019, 09/09/2019, 07/28/2020   Pneumococcal Polysaccharide-23 07/25/2002, 10/31/2017   Pneumococcal-Unspecified 05/07/2019   Td 07/25/2005   Tdap 05/03/2019  Unspecified SARS-COV-2 Vaccination 07/28/2020   Zoster Recombinat (Shingrix) 06/25/2021   Pertinent  Health Maintenance Due  Topic Date Due   OPHTHALMOLOGY EXAM  09/08/2019   FOOT EXAM  01/31/2022 (Originally 02/18/2021)   MAMMOGRAM  09/13/2022 (Originally 01/14/2017)   COLONOSCOPY (Pts 45-47yr Insurance coverage will need to be confirmed)  09/13/2022 (Originally 09/02/1997)   HEMOGLOBIN A1C  10/12/2021   INFLUENZA VACCINE  Completed   DEXA SCAN  Completed   Fall Risk 06/04/2018 06/04/2018 02/21/2019 05/22/2020 09/13/2021  Falls in the past year? - - (No Data) 1 0  Number of falls in past year - - Emmi Telephone Survey Actual Response =  - -  Was there an injury with Fall? - - - 0 0  Was there an injury with Fall? - - - - -  Fall Risk Category Calculator - - - 2 0  Fall Risk Category - - - Moderate Low  Patient Fall Risk Level High fall risk High fall risk - Moderate fall risk Low fall risk  Patient at Risk for Falls Due to - - - History of fall(s);Impaired balance/gait;Impaired mobility Impaired balance/gait  Fall risk Follow up - - - Falls evaluation completed;Education provided;Falls prevention discussed  Education provided;Falls prevention discussed     Vitals:   09/30/21 1144  BP: (!) 141/86  Pulse: 76  Resp: 18  Temp: 97.9 F (36.6 C)  SpO2: 95%  Height: '5\' 1"'$  (1.549 m)   Body mass index is 34.65 kg/m.  Physical Exam     Labs reviewed: Recent Labs    03/09/21 0000 04/14/21 0000 07/14/21 0000  NA 137 142 145  K 3.9 4.0 4.1  CL 100 102 105  CO2 24* 31* 28*  BUN 17 16 27*  CREATININE 1.4* 1.4* 1.9*  CALCIUM 9.0 9.6 9.4   Recent Labs    07/14/21 0000  AST 12*  ALT 9  ALKPHOS 91  ALBUMIN 3.3*   Recent Labs    04/14/21 0000 07/14/21 0000 09/08/21 0000  WBC 9.6 10.6 11.7  NEUTROABS  --   --  8.40  HGB 9.7* 8.7* 9.4*  HCT 30* 29* 30*  PLT 481* 526* 507*   Lab Results  Component Value Date   TSH 4.22 09/30/2020   Lab Results  Component Value Date   HGBA1C 6.0 07/14/2021   Lab Results  Component Value Date   CHOL 178 08/30/2012   HDL 49 08/30/2012   LDLCALC 92 08/30/2012   TRIG 187 (H) 08/30/2012   CHOLHDL 3.6 08/30/2012    Significant Diagnostic Results in last 30 days:  No results found.  Assessment/Plan ***   Family/ staff Communication: Discussed plan of care with resident and charge nurse  Labs/tests ordered:     MDurenda Age DNP, MSN, FNP-BC [facility] [phone](Monday-Friday 8:00 a.m. - 5:00 p.m.) [phone](after hours)

## 2021-10-12 ENCOUNTER — Encounter: Payer: Self-pay | Admitting: Internal Medicine

## 2021-10-12 ENCOUNTER — Non-Acute Institutional Stay (SKILLED_NURSING_FACILITY): Payer: Medicare Other | Admitting: Internal Medicine

## 2021-10-12 DIAGNOSIS — G40909 Epilepsy, unspecified, not intractable, without status epilepticus: Secondary | ICD-10-CM

## 2021-10-12 DIAGNOSIS — R4189 Other symptoms and signs involving cognitive functions and awareness: Secondary | ICD-10-CM

## 2021-10-12 DIAGNOSIS — R195 Other fecal abnormalities: Secondary | ICD-10-CM | POA: Diagnosis not present

## 2021-10-12 DIAGNOSIS — F411 Generalized anxiety disorder: Secondary | ICD-10-CM | POA: Insufficient documentation

## 2021-10-12 DIAGNOSIS — R29818 Other symptoms and signs involving the nervous system: Secondary | ICD-10-CM | POA: Diagnosis not present

## 2021-10-12 NOTE — Assessment & Plan Note (Addendum)
Stool described as loose and not frankly diarrheal.  Obsession with stools results in her staying in the bathroom for extended periods of time.  Trial of probiotics will be initiated. ?

## 2021-10-12 NOTE — Assessment & Plan Note (Addendum)
No seizures reported by staff.  No change in present regimen ?

## 2021-10-12 NOTE — Assessment & Plan Note (Signed)
Today she confabulates about apparently being treated for an infectious process.  She refers to this as "Angel something".  She is believes that items of clothing are disappearing when she goes to exercise.  She is not exercising according to staff. ?

## 2021-10-12 NOTE — Progress Notes (Signed)
? ?  NURSING HOME LOCATION:  [facility] ?ROOM NUMBER:  203 A ? ?CODE STATUS:  Full Code ? ?PCP:  Durenda Age NP,PSC ? ?This is a nursing facility follow up visit for specific acute issue of Social Security Administration request for competency to manage benefits. ? ?Interim medical record and care since last SNF visit was updated with review of diagnostic studies and change in clinical status since last visit were documented. ? ?HPI: She is a permanent resident of the facility with medical diagnoses of history of asthma, allergic rhinitis, GERD, essential hypertension, diabetes with peripheral vascular disease, and documented neurocognitive deficits. ?Significant surgical procedures include transsphenoidal approach for pituitary macroadenoma in 2019. ? ?Mini-Mental status exams have been performed previously. On 09/27/18/2020 she scored 15 out of 30 indicating dementia.  She also exhibited confabulation and experienced hallucinations.  The latter mainly related to feeling that she had interaction with her parents who had been long dead.  Additionally she was exhibiting hoarding behaviors keeping the facility meal menus in her pocket book.  She also is concerned that her personal items are being stolen. ?On 03/16/2020 the score was 11 out of 30 suggesting progression of the dementia. ?Other behavioral issues include noncompliance with recommended health surveillance issues as the patient agrees to the appointment but will not go the day of the appointment.  She has had CNS surgery for pituitary macroadenoma.  Her father had Alzheimer's. ? ?Review of systems: She confabulates about items of clothing and other personal items being taken when she "goes to exercise".  She does not exercise.  She spends most of her time in the bathroom according to staff.  She describes intermittent constipation as well as diarrhea.  The stool is described as thick and not watery.  Staff states that she  spends excessively long periods of time in the bathroom with bowel function followed by methodical cleaning of the bathroom environment. ?When asked about any other active medical problems she replied "what is coming on right now? Angel something".  She stated they were sending "stuff from the drugstore" apparently meaning antivirals for recent COVID outbreak.  COVID review of systems was negative. ?She states that she wants to leave the facility and "go home" ; therefore , she has applied for housing at a specific address but she cannot remember the name of the street. ?   ?Physical exam:  ?Pertinent or positive findings: Indeed when we came to interview her she was in the bathroom.  It took almost 10 minutes for her to come out of the bathroom.  Her gait was broad-based and extremely slow.  She exhibits blank facies.  Her responses are also slow and unfocused as demonstrated in the review of systems comments. ? ?See summary under each active problem in the Problem List with associated updated therapeutic plan ? ? ?

## 2021-10-12 NOTE — Patient Instructions (Signed)
See assessment and plan under each diagnosis in the problem list and acutely for this visit 

## 2021-10-12 NOTE — Assessment & Plan Note (Addendum)
Clinically her GAD/depression is unchanged.  Psychiatry follow-up as clinically indicated. ?

## 2021-11-05 ENCOUNTER — Non-Acute Institutional Stay (SKILLED_NURSING_FACILITY): Payer: Medicare Other | Admitting: Adult Health

## 2021-11-05 ENCOUNTER — Encounter: Payer: Self-pay | Admitting: Adult Health

## 2021-11-05 DIAGNOSIS — E1122 Type 2 diabetes mellitus with diabetic chronic kidney disease: Secondary | ICD-10-CM

## 2021-11-05 DIAGNOSIS — G300 Alzheimer's disease with early onset: Secondary | ICD-10-CM

## 2021-11-05 DIAGNOSIS — G40909 Epilepsy, unspecified, not intractable, without status epilepticus: Secondary | ICD-10-CM

## 2021-11-05 DIAGNOSIS — J45909 Unspecified asthma, uncomplicated: Secondary | ICD-10-CM | POA: Diagnosis not present

## 2021-11-05 DIAGNOSIS — I129 Hypertensive chronic kidney disease with stage 1 through stage 4 chronic kidney disease, or unspecified chronic kidney disease: Secondary | ICD-10-CM

## 2021-11-05 DIAGNOSIS — E1159 Type 2 diabetes mellitus with other circulatory complications: Secondary | ICD-10-CM

## 2021-11-05 DIAGNOSIS — F02C0 Dementia in other diseases classified elsewhere, severe, without behavioral disturbance, psychotic disturbance, mood disturbance, and anxiety: Secondary | ICD-10-CM

## 2021-11-05 DIAGNOSIS — I152 Hypertension secondary to endocrine disorders: Secondary | ICD-10-CM

## 2021-11-05 DIAGNOSIS — N183 Chronic kidney disease, stage 3 unspecified: Secondary | ICD-10-CM

## 2021-11-05 NOTE — Progress Notes (Signed)
? ?Location:  [facility] ?Nursing Home Room Number: 203-A ?Place of Service:  SNF (31) ?Provider:  Durenda Age, DNP, FNP-BC ? ?Patient Care Team: ?Hendricks Limes, MD as PCP - General (Internal Medicine) ?Medina-Vargas, Senaida Lange, NP as Nurse Practitioner (Internal Medicine) ? ?Extended Emergency Contact Information ?Primary Emergency Contact: Eugenie Filler ?Home Phone: [phone] ?Relation: Sister ?Secondary Emergency Contact: Unknown, Patrice ?Mobile Phone: [phone] ?Relation: Niece ? ?Code Status:  Full Code ? ?Goals of care: Advanced Directive information ? ?  11/05/2021  ? 11:57 AM  ?Advanced Directives  ?Does Patient Have a Medical Advance Directive? No  ?Does patient want to make changes to medical advance directive? No - Patient declined  ? ? ? ?Chief Complaint  ?Patient presents with  ? Medical Management of Chronic Issues  ?  Routine Visit  ? ? ?HPI:  ?Pt is a 71 y.o. female seen today for medical management of chronic diseases. She is a long-term care resident of [facility]. She has a PMH of diabetes mellitus, essential hypertension and history of pituitary microadenoma. ? ?Seizure disorder ([place])  -   no reported seizures, takes levetiracetam 500 mg 1 twice a day ? ?Hypertension associated with diabetes ([place])  -   SBPs ranging from 116-147, takes losartan 100 mg 1 tablet daily and hydralazine 25 mg 1 tab 3 times a day ? ?Moderate asthma without complication, unspecified whether persistent - no reported SOB/wheezing takes albuterol HFA 90 mcg inhaler 2 puffs into the lungs every 6 hours PRN and Advair 100-50 disk 1 puff into the lungs twice a day a ? ?Type 2 DM with CKD stage 3 and hypertension ([facility]) -   CBGs ranging 84-151, takes metformin 500 mg 1 tab twice a day ? ? ?Severe early onset Alzheimer's dementia without behavioral disturbance, psychotic disturbance, mood disturbance, or anxiety ([facility])  -  BIMS score 7/15, ranging in severe cognitive  impairment ? ? ? ?Past Medical History:  ?Diagnosis Date  ? Allergic rhinitis   ? Arthritis   ? "ankles, feet, knees" (10/30/2017)  ? Asthma   ? Bursitis   ? Carpal tunnel syndrome   ? GERD (gastroesophageal reflux disease)   ? HTN (hypertension)   ? Migraine   ? "a couple/yr" (10/30/2017)  ? Obesity   ? Peripheral vascular disease ([place])   ? Pneumonia 1960s X 1  ? Sciatica   ? Tendinitis   ? Type II diabetes mellitus ([place])   ? Type II  ? UTI (urinary tract infection)   ? ?Past Surgical History:  ?Procedure Laterality Date  ? CRANIOTOMY N/A 01/19/2018  ? Procedure: ENDOSCOPIC TRANSPHENOIDAL RESECTION OF PITUITARY TUMOR;  Surgeon: Consuella Lose, MD;  Location: [facility];  Service: Neurosurgery;  Laterality: N/A;  ENDOSCOPIC TRANSPHENOIDAL RESECTION OF PITUITARY TUMOR  ? GANGLION CYST EXCISION Left   ? KNEE ARTHROSCOPY Left 2003  ? PLANTAR FASCIA RELEASE Right 1995  ? "heel"  ? SINUS ENDO WITH FUSION N/A 01/19/2018  ? Procedure: SINUS ENDO WITH FUSION;  Surgeon: Jerrell Belfast, MD;  Location: [facility];  Service: ENT;  Laterality: N/A;  ? TONSILLECTOMY  1958  ? TRANSPHENOIDAL APPROACH EXPOSURE N/A 01/19/2018  ? Procedure: TRANSPHENOIDAL  RESECTION OF PITUITARY TUMOR;  Surgeon: Jerrell Belfast, MD;  Location: [facility];  Service: ENT;  Laterality: N/A;  ? [place]; 1997  ? Dr [name]  ? ? ?Allergies  ?Allergen Reactions  ? Penicillins Hives and Other (See Comments)  ?  ++ got keflex in  2013 and 2019++ ?Has patient had a PCN reaction causing immediate rash, facial/tongue/throat swelling, SOB or lightheadedness with hypotension: No ?Has patient had a PCN reaction causing severe rash involving mucus membranes or skin necrosis: No ?PATIENT HAS HAD A PCN REACTION THAT REQUIRED HOSPITALIZATION:  #  #  YES  #  #  ?Has patient had a PCN reaction occurring within the last 10 years: No ?  ? Naproxen   ?  History of GI intolerance with indomethacin.  She is on PPI for GERD.  NSAIDs should not be used long-term or as  maintenance medication due to the comorbidities and age as per Beers' List  ? Codeine Nausea And Vomiting and Other (See Comments)  ? Indomethacin Diarrhea  ? ? ?Outpatient Encounter Medications as of 11/05/2021  ?Medication Sig  ? acetaminophen (TYLENOL) 650 MG CR tablet Take 650 mg by mouth every 6 (six) hours as needed for pain.  ? ADVAIR DISKUS 100-50 MCG/DOSE AEPB Inhale 1 puff into the lungs 2 (two) times daily.  ? albuterol (PROVENTIL HFA;VENTOLIN HFA) 108 (90 Base) MCG/ACT inhaler Inhale 2 puffs into the lungs every 6 (six) hours as needed for wheezing or shortness of breath.  ? barrier cream (NON-SPECIFIED) CREA Apply 1 application topically 2 (two) times daily as needed.  ? bisacodyl (DULCOLAX) 10 MG suppository Constipation (2 of 4): If not relieved by MOM, give 10 mg Bisacodyl suppositiory rectally X 1 dose in 24 hours as needed (Do not use constipation standing orders for residents with renal failure/CFR less than 30. Contact MD for orders)  ? cetaphil (CETAPHIL) cream Apply 1 application topically at bedtime. Apply to bilateral lower legs for dry skin.  ? Cholecalciferol (VITAMIN D3) 125 MCG (5000 UT) CAPS Take 1 capsule by mouth daily.   ? esomeprazole (NEXIUM) 20 MG capsule Take 1 capsule (20 mg total) by mouth every morning. For acid reflex  ? ferrous sulfate 325 (65 FE) MG tablet Take 325 mg by mouth in the morning and at bedtime.  ? furosemide (LASIX) 40 MG tablet TAKE ONE TABLET BY MOUTH EVERY DAY  ? hydrALAZINE (APRESOLINE) 25 MG tablet Take 25 mg by mouth 3 (three) times daily.   ? levETIRAcetam (KEPPRA) 500 MG tablet Take 1 tablet (500 mg total) by mouth 2 (two) times daily.  ? loperamide (IMODIUM A-D) 2 MG tablet Take 2 mg by mouth as needed for diarrhea or loose stools. Give after each stool prn, not to exceed 8 mg in 24 hours  ? losartan (COZAAR) 100 MG tablet Take 100 mg by mouth daily.  ? magnesium hydroxide (MILK OF MAGNESIA) 400 MG/5ML suspension Constipation (1 of 4): If no BM in 3  days, give 30 cc Milk of Magnesium p.o. x 1 dose in 24 hours as needed (Do not use standing constipation orders for residents with renal failure CFR less than 30. Contact MD for orders)  ? metFORMIN (GLUCOPHAGE) 500 MG tablet Take 500 mg by mouth 2 (two) times daily with a meal.  ? metoprolol succinate (TOPROL-XL) 50 MG 24 hr tablet Take 50 mg by mouth 2 (two) times daily.   ? mometasone (NASONEX) 50 MCG/ACT nasal spray Place 1 spray into the nose as needed.  ? NON FORMULARY DIET: REGULAR, THIN LIQUIDS  ? promethazine (PHENERGAN) 25 MG tablet Take 25 mg by mouth every 6 (six) hours as needed for nausea or vomiting.  ? senna-docusate (SENOKOT-S) 8.6-50 MG tablet Take 1 tablet by mouth 2 (two) times daily.  ? Sodium  Phosphates (RA SALINE ENEMA RE) Place rectally as needed.  ? vitamin C (ASCORBIC ACID) 500 MG tablet Take 500 mg by mouth 2 (two) times daily.  ? ?No facility-administered encounter medications on file as of 11/05/2021.  ? ? ?Review of Systems  ?Constitutional:  Negative for appetite change, chills, fatigue and fever.  ?HENT:  Negative for congestion, hearing loss, rhinorrhea and sore throat.   ?Eyes: Negative.   ?Respiratory:  Negative for cough, shortness of breath and wheezing.   ?Cardiovascular:  Negative for chest pain, palpitations and leg swelling.  ?Gastrointestinal:  Negative for abdominal pain, constipation, diarrhea, nausea and vomiting.  ?Genitourinary:  Negative for dysuria.  ?Musculoskeletal:  Negative for arthralgias, back pain and myalgias.  ?Skin:  Negative for color change, rash and wound.  ?Neurological:  Negative for dizziness, weakness and headaches.  ?Psychiatric/Behavioral:  Negative for behavioral problems. The patient is not nervous/anxious.    ? ? ? ?Immunization History  ?Administered Date(s) Administered  ? Fluad Quad(high Dose 65+) 05/04/2021  ? Influenza Split 03/08/2013  ? Influenza Whole 09/03/2009, 06/03/2010  ? Influenza,inj,Quad PF,6+ Mos 06/25/2014  ?  Influenza-Unspecified 03/25/2018, 05/06/2019, 05/14/2020  ? Moderna SARS-COV2 Booster Vaccination 07/22/2021  ? Moderna Sars-Covid-2 Vaccination 08/12/2019, 09/09/2019, 07/28/2020  ? Pneumococcal Polysaccharide-23 07/25/2002, 10/31/2017  ? Pne

## 2021-11-16 LAB — HEMOGLOBIN A1C: Hemoglobin A1C: 5.9

## 2021-11-16 LAB — BASIC METABOLIC PANEL
BUN: 21 (ref 4–21)
CO2: 26 — AB (ref 13–22)
Chloride: 106 (ref 99–108)
Creatinine: 1.4 — AB (ref 0.5–1.1)
Glucose: 76
Potassium: 3.9 mEq/L (ref 3.5–5.1)
Sodium: 140 (ref 137–147)

## 2021-11-16 LAB — CBC AND DIFFERENTIAL
HCT: 26 — AB (ref 36–46)
Hemoglobin: 8.6 — AB (ref 12.0–16.0)
Platelets: 436 10*3/uL — AB (ref 150–400)
WBC: 9.4

## 2021-11-16 LAB — CBC: RBC: 3.22 — AB (ref 3.87–5.11)

## 2021-11-16 LAB — COMPREHENSIVE METABOLIC PANEL
Calcium: 9.2 (ref 8.7–10.7)
eGFR: 42

## 2021-11-25 ENCOUNTER — Encounter: Payer: Self-pay | Admitting: Adult Health

## 2021-11-25 ENCOUNTER — Non-Acute Institutional Stay (SKILLED_NURSING_FACILITY): Payer: Medicare Other | Admitting: Adult Health

## 2021-11-25 DIAGNOSIS — E1159 Type 2 diabetes mellitus with other circulatory complications: Secondary | ICD-10-CM | POA: Diagnosis not present

## 2021-11-25 DIAGNOSIS — G40909 Epilepsy, unspecified, not intractable, without status epilepticus: Secondary | ICD-10-CM

## 2021-11-25 DIAGNOSIS — E1122 Type 2 diabetes mellitus with diabetic chronic kidney disease: Secondary | ICD-10-CM

## 2021-11-25 DIAGNOSIS — I129 Hypertensive chronic kidney disease with stage 1 through stage 4 chronic kidney disease, or unspecified chronic kidney disease: Secondary | ICD-10-CM

## 2021-11-25 DIAGNOSIS — J45909 Unspecified asthma, uncomplicated: Secondary | ICD-10-CM

## 2021-11-25 DIAGNOSIS — I152 Hypertension secondary to endocrine disorders: Secondary | ICD-10-CM

## 2021-11-25 DIAGNOSIS — N183 Chronic kidney disease, stage 3 unspecified: Secondary | ICD-10-CM

## 2021-11-25 NOTE — Progress Notes (Signed)
? ?Location:  [facility] ?Nursing Home Room Number: KV42.A ?Place of Service:  SNF (31) ?Provider:  Durenda Age, DNP, FNP-BC ? ?Patient Care Team: ?Hendricks Limes, MD as PCP - General (Internal Medicine) ?Medina-Vargas, Senaida Lange, NP as Nurse Practitioner (Internal Medicine) ? ?Extended Emergency Contact Information ?Primary Emergency Contact: Eugenie Filler ?Home Phone: [phone] ?Relation: Sister ?Secondary Emergency Contact: Unknown, Patrice ?Mobile Phone: [phone] ?Relation: Niece ? ?Code Status:  FULL ? ?Goals of care: Advanced Directive information ? ?  11/25/2021  ? 10:18 AM  ?Advanced Directives  ?Does Patient Have a Medical Advance Directive? No  ?Does patient want to make changes to medical advance directive? No - Patient declined  ? ? ? ?Chief Complaint  ?Patient presents with  ? Medical Management of Chronic Issues  ?  Routine Visit ?  ? ? ?HPI:  ?Pt is a 72 y.o. female seen today for medical management of chronic diseases. She is a long-term care resident of [facility]. ? ?Type 2 DM with CKD stage 3 and hypertension (HCC) -  CBGs ranging from 93-160, with an outlier 84 and 87, takes metformin 500 mg 1 tab twice a day  ? ?Hypertension associated with diabetes ([place])  -  SBPs ranging from 123 to 149, with outlier  107, takes metoprolol tartrate 20 mg 1 tab twice a day, hydralazine 25 mg 1 tab 3 times a day and losartan 100 mg 1 tab daily ? ?Seizure disorder ([place]) -   no recent seizures, takes levetiracetam 500 mg 1 tab twice a day ? ?Moderate asthma without complication, unspecified whether persistent -  no wheezing, takes  albuterol HFA PRN and Advair 100-50 DISK 1 puff into  lungs BID ? ? ?Past Medical History:  ?Diagnosis Date  ? Allergic rhinitis   ? Arthritis   ? "ankles, feet, knees" (10/30/2017)  ? Asthma   ? Bursitis   ? Carpal tunnel syndrome   ? GERD (gastroesophageal reflux disease)   ? HTN (hypertension)   ? Migraine   ? "a couple/yr" (10/30/2017)  ?  Obesity   ? Peripheral vascular disease ([place])   ? Pneumonia 1960s X 1  ? Sciatica   ? Tendinitis   ? Type II diabetes mellitus ([place])   ? Type II  ? UTI (urinary tract infection)   ? ?Past Surgical History:  ?Procedure Laterality Date  ? CRANIOTOMY N/A 01/19/2018  ? Procedure: ENDOSCOPIC TRANSPHENOIDAL RESECTION OF PITUITARY TUMOR;  Surgeon: Consuella Lose, MD;  Location: [place];  Service: Neurosurgery;  Laterality: N/A;  ENDOSCOPIC TRANSPHENOIDAL RESECTION OF PITUITARY TUMOR  ? GANGLION CYST EXCISION Left   ? KNEE ARTHROSCOPY Left 2003  ? PLANTAR FASCIA RELEASE Right 1995  ? "heel"  ? SINUS ENDO WITH FUSION N/A 01/19/2018  ? Procedure: SINUS ENDO WITH FUSION;  Surgeon: Jerrell Belfast, MD;  Location: [facility];  Service: ENT;  Laterality: N/A;  ? TONSILLECTOMY  1958  ? TRANSPHENOIDAL APPROACH EXPOSURE N/A 01/19/2018  ? Procedure: TRANSPHENOIDAL  RESECTION OF PITUITARY TUMOR;  Surgeon: Jerrell Belfast, MD;  Location: [place];  Service: ENT;  Laterality: N/A;  ? [place]; 1997  ? Dr [name]  ? ? ?Allergies  ?Allergen Reactions  ? Penicillins Hives and Other (See Comments)  ?  ++ got keflex in 2013 and 2019++ ?Has patient had a PCN reaction causing immediate rash, facial/tongue/throat swelling, SOB or lightheadedness with hypotension: No ?Has patient had a PCN reaction causing severe rash involving mucus membranes or skin necrosis:  No ?PATIENT HAS HAD A PCN REACTION THAT REQUIRED HOSPITALIZATION:  #  #  YES  #  #  ?Has patient had a PCN reaction occurring within the last 10 years: No ?  ? Naproxen   ?  History of GI intolerance with indomethacin.  She is on PPI for GERD.  NSAIDs should not be used long-term or as maintenance medication due to the comorbidities and age as per Beers' List  ? Codeine Nausea And Vomiting and Other (See Comments)  ? Indomethacin Diarrhea  ? ? ?Outpatient Encounter Medications as of 11/25/2021  ?Medication Sig  ? acetaminophen (TYLENOL) 650 MG CR tablet Take 650 mg by mouth every  6 (six) hours as needed for pain.  ? ADVAIR DISKUS 100-50 MCG/DOSE AEPB Inhale 1 puff into the lungs 2 (two) times daily.  ? albuterol (PROVENTIL HFA;VENTOLIN HFA) 108 (90 Base) MCG/ACT inhaler Inhale 2 puffs into the lungs every 6 (six) hours as needed for wheezing or shortness of breath.  ? barrier cream (NON-SPECIFIED) CREA Apply 1 application topically 2 (two) times daily as needed.  ? bisacodyl (DULCOLAX) 10 MG suppository Constipation (2 of 4): If not relieved by MOM, give 10 mg Bisacodyl suppositiory rectally X 1 dose in 24 hours as needed (Do not use constipation standing orders for residents with renal failure/CFR less than 30. Contact MD for orders)  ? cetaphil (CETAPHIL) cream Apply 1 application topically at bedtime. Apply to bilateral lower legs for dry skin.  ? Cholecalciferol (VITAMIN D3) 125 MCG (5000 UT) CAPS Take 1 capsule by mouth daily.   ? esomeprazole (NEXIUM) 20 MG capsule Take 1 capsule (20 mg total) by mouth every morning. For acid reflex  ? ferrous sulfate 325 (65 FE) MG tablet Take 325 mg by mouth in the morning and at bedtime.  ? furosemide (LASIX) 40 MG tablet TAKE ONE TABLET BY MOUTH EVERY DAY  ? hydrALAZINE (APRESOLINE) 25 MG tablet Take 25 mg by mouth 3 (three) times daily.   ? levETIRAcetam (KEPPRA) 500 MG tablet Take 1 tablet (500 mg total) by mouth 2 (two) times daily.  ? loperamide (IMODIUM A-D) 2 MG tablet Take 2 mg by mouth as needed for diarrhea or loose stools. Give after each stool prn, not to exceed 8 mg in 24 hours  ? losartan (COZAAR) 100 MG tablet Take 100 mg by mouth daily.  ? magnesium hydroxide (MILK OF MAGNESIA) 400 MG/5ML suspension Constipation (1 of 4): If no BM in 3 days, give 30 cc Milk of Magnesium p.o. x 1 dose in 24 hours as needed (Do not use standing constipation orders for residents with renal failure CFR less than 30. Contact MD for orders)  ? metFORMIN (GLUCOPHAGE) 500 MG tablet Take 500 mg by mouth 2 (two) times daily with a meal.  ? metoprolol succinate  (TOPROL-XL) 50 MG 24 hr tablet Take 50 mg by mouth 2 (two) times daily.   ? mometasone (NASONEX) 50 MCG/ACT nasal spray Place 1 spray into the nose as needed.  ? NON FORMULARY DIET: REGULAR, THIN LIQUIDS  ? promethazine (PHENERGAN) 25 MG tablet Take 25 mg by mouth every 6 (six) hours as needed for nausea or vomiting.  ? senna-docusate (SENOKOT-S) 8.6-50 MG tablet Take 1 tablet by mouth 2 (two) times daily.  ? Sodium Phosphates (RA SALINE ENEMA RE) Place rectally as needed.  ? vitamin C (ASCORBIC ACID) 500 MG tablet Take 500 mg by mouth 2 (two) times daily.  ? ?No facility-administered encounter medications on file  as of 11/25/2021.  ? ? ?Review of Systems  ?Constitutional:  Negative for appetite change, chills, fatigue and fever.  ?HENT:  Negative for congestion, hearing loss, rhinorrhea and sore throat.   ?Eyes: Negative.   ?Respiratory:  Negative for cough, shortness of breath and wheezing.   ?Cardiovascular:  Negative for chest pain and palpitations.  ?Gastrointestinal:  Negative for abdominal pain, constipation, diarrhea, nausea and vomiting.  ?Genitourinary:  Negative for dysuria.  ?Musculoskeletal:  Negative for arthralgias, back pain and myalgias.  ?Skin:  Negative for color change, rash and wound.  ?Neurological:  Negative for dizziness, weakness and headaches.  ?Psychiatric/Behavioral:  Negative for behavioral problems. The patient is not nervous/anxious.    ? ? ? ?Immunization History  ?Administered Date(s) Administered  ? Fluad Quad(high Dose 65+) 05/04/2021  ? Influenza Split 03/08/2013  ? Influenza Whole 09/03/2009, 06/03/2010  ? Influenza,inj,Quad PF,6+ Mos 06/25/2014  ? Influenza-Unspecified 03/25/2018, 05/06/2019, 05/14/2020  ? Moderna SARS-COV2 Booster Vaccination 07/22/2021  ? Moderna Sars-Covid-2 Vaccination 08/12/2019, 09/09/2019, 07/28/2020  ? Pneumococcal Polysaccharide-23 07/25/2002, 10/31/2017  ? Pneumococcal-Unspecified 05/07/2019  ? Td 07/25/2005  ? Tdap 05/03/2019  ? Unspecified SARS-COV-2  Vaccination 07/28/2020  ? Zoster Recombinat (Shingrix) 06/25/2021  ? ?Pertinent  Health Maintenance Due  ?Topic Date Due  ? OPHTHALMOLOGY EXAM  09/08/2019  ? HEMOGLOBIN A1C  10/12/2021  ? FOOT EXAM

## 2021-12-21 ENCOUNTER — Non-Acute Institutional Stay (SKILLED_NURSING_FACILITY): Payer: Medicare Other | Admitting: Adult Health

## 2021-12-21 ENCOUNTER — Encounter: Payer: Self-pay | Admitting: Adult Health

## 2021-12-21 DIAGNOSIS — Z7189 Other specified counseling: Secondary | ICD-10-CM | POA: Diagnosis not present

## 2021-12-21 DIAGNOSIS — J45909 Unspecified asthma, uncomplicated: Secondary | ICD-10-CM

## 2021-12-21 DIAGNOSIS — E1122 Type 2 diabetes mellitus with diabetic chronic kidney disease: Secondary | ICD-10-CM

## 2021-12-21 DIAGNOSIS — E1159 Type 2 diabetes mellitus with other circulatory complications: Secondary | ICD-10-CM

## 2021-12-21 DIAGNOSIS — N183 Chronic kidney disease, stage 3 unspecified: Secondary | ICD-10-CM

## 2021-12-21 DIAGNOSIS — I129 Hypertensive chronic kidney disease with stage 1 through stage 4 chronic kidney disease, or unspecified chronic kidney disease: Secondary | ICD-10-CM

## 2021-12-21 DIAGNOSIS — G40909 Epilepsy, unspecified, not intractable, without status epilepticus: Secondary | ICD-10-CM | POA: Diagnosis not present

## 2021-12-21 DIAGNOSIS — G300 Alzheimer's disease with early onset: Secondary | ICD-10-CM

## 2021-12-21 DIAGNOSIS — I152 Hypertension secondary to endocrine disorders: Secondary | ICD-10-CM

## 2021-12-21 DIAGNOSIS — F02C0 Dementia in other diseases classified elsewhere, severe, without behavioral disturbance, psychotic disturbance, mood disturbance, and anxiety: Secondary | ICD-10-CM

## 2021-12-21 NOTE — Progress Notes (Signed)
Location:  [place] Room Number: 203-A Place of Service:  SNF (31) Provider:  Durenda Age, DNP, FNP-BC  Patient Care Team: Hendricks Limes, MD as PCP - General (Internal Medicine) Medina-Vargas, Senaida Lange, NP as Nurse Practitioner (Internal Medicine)  Extended Emergency Contact Information Primary Emergency Contact: Kimmarie, [place] Home Phone: [phone] Relation: Sister Secondary Emergency Contact: Sandria Manly Mobile Phone: [phone] Relation: Niece  Code Status:  Full Code  Goals of care: Advanced Directive information    12/21/2021   11:57 AM  Advanced Directives  Does Patient Have a Medical Advance Directive? No  Does patient want to make changes to medical advance directive? No - Patient declined     Chief Complaint  Patient presents with   Acute Visit    Care plan meeting    HPI:  Pt is a 71 y.o. Mariah [name] who had a care plan meeting today attended by Education officer, museum and NP. Resident declined to attend the meeting. She remains to be full code. Discussed medications, vital signs and weights. Latest BIMS score 7/15, ranging in severe cognitive impairment. No new medication for the past 3 months. Latest weight is 181.6 lbs. The meeting lasted for 20 minutes.   Past Medical History:  Diagnosis Date   Allergic rhinitis    Arthritis    "ankles, feet, knees" (10/30/2017)   Asthma    Bursitis    Carpal tunnel syndrome    GERD (gastroesophageal reflux disease)    HTN (hypertension)    Migraine    "a couple/yr" (10/30/2017)   Obesity    Peripheral vascular disease ([place])    Pneumonia 1960s X 1   Sciatica    Tendinitis    Type II diabetes mellitus ([place])    Type II   UTI (urinary tract infection)    Past Surgical History:  Procedure Laterality Date   CRANIOTOMY N/A 01/19/2018   Procedure: ENDOSCOPIC TRANSPHENOIDAL RESECTION OF PITUITARY TUMOR;  Surgeon: Consuella Lose, MD;  Location: [place];  Service: Neurosurgery;   Laterality: N/A;  ENDOSCOPIC TRANSPHENOIDAL RESECTION OF PITUITARY TUMOR   GANGLION CYST EXCISION Left    KNEE ARTHROSCOPY Left 2003   PLANTAR FASCIA RELEASE Right 1995   "heel"   SINUS ENDO WITH FUSION N/A 01/19/2018   Procedure: SINUS ENDO WITH FUSION;  Surgeon: Jerrell Belfast, MD;  Location: [place];  Service: ENT;  Laterality: N/A;   [place] N/A 01/19/2018   Procedure: TRANSPHENOIDAL  RESECTION OF PITUITARY TUMOR;  Surgeon: Jerrell Belfast, MD;  Location: [facility];  Service: ENT;  Laterality: N/A;   [place]; 1997   Dr [name]    Allergies  Allergen Reactions   Penicillins Hives and Other (See Comments)    ++ got keflex in 2013 and 2019++ Has patient had a PCN reaction causing immediate rash, facial/tongue/throat swelling, SOB or lightheadedness with hypotension: No Has patient had a PCN reaction causing severe rash involving mucus membranes or skin necrosis: No PATIENT HAS HAD A PCN REACTION THAT REQUIRED HOSPITALIZATION:  #  #  YES  #  #  Has patient had a PCN reaction occurring within the last 10 years: No    Naproxen     History of GI intolerance with indomethacin.  She is on PPI for GERD.  NSAIDs should not be used long-term or as maintenance medication due to the comorbidities and age as per Beers' List   Codeine Nausea And Vomiting and Other (See Comments)  Indomethacin Diarrhea    Outpatient Encounter Medications as of 12/21/2021  Medication Sig   acetaminophen (TYLENOL) 650 MG CR tablet Take 650 mg by mouth every 6 (six) hours as needed for pain.   ADVAIR DISKUS 100-50 MCG/DOSE AEPB Inhale 1 puff into the lungs 2 (two) times daily.   albuterol (PROVENTIL HFA;VENTOLIN HFA) 108 (90 Base) MCG/ACT inhaler Inhale 2 puffs into the lungs every 6 (six) hours as needed for wheezing or shortness of breath.   barrier cream (NON-SPECIFIED) CREA Apply 1 application topically 2 (two) times daily as needed.   bisacodyl  (DULCOLAX) 10 MG suppository Constipation (2 of 4): If not relieved by MOM, give 10 mg Bisacodyl suppositiory rectally X 1 dose in 24 hours as needed (Do not use constipation standing orders for residents with renal failure/CFR less than 30. Contact MD for orders)   cetaphil (CETAPHIL) cream Apply 1 application topically at bedtime. Apply to bilateral lower legs for dry skin.   Cholecalciferol (VITAMIN D3) 125 MCG (5000 UT) CAPS Take 1 capsule by mouth daily.    esomeprazole (NEXIUM) 20 MG capsule Take 1 capsule (20 mg total) by mouth every morning. For acid reflex   ferrous sulfate 325 (65 FE) MG tablet Take 325 mg by mouth in the morning and at bedtime.   furosemide (LASIX) 40 MG tablet TAKE ONE TABLET BY MOUTH EVERY DAY   hydrALAZINE (APRESOLINE) 25 MG tablet Take 25 mg by mouth 3 (three) times daily.    levETIRAcetam (KEPPRA) 500 MG tablet Take 1 tablet (500 mg total) by mouth 2 (two) times daily.   loperamide (IMODIUM A-D) 2 MG tablet Take 2 mg by mouth as needed for diarrhea or loose stools. Give after each stool prn, not to exceed 8 mg in 24 hours   losartan (COZAAR) 100 MG tablet Take 100 mg by mouth daily.   magnesium hydroxide (MILK OF MAGNESIA) 400 MG/5ML suspension Constipation (1 of 4): If no BM in 3 days, give 30 cc Milk of Magnesium p.o. x 1 dose in 24 hours as needed (Do not use standing constipation orders for residents with renal failure CFR less than 30. Contact MD for orders)   metFORMIN (GLUCOPHAGE) 500 MG tablet Take 500 mg by mouth 2 (two) times daily with a meal.   metoprolol succinate (TOPROL-XL) 50 MG 24 hr tablet Take 50 mg by mouth 2 (two) times daily.    mometasone (NASONEX) 50 MCG/ACT nasal spray Place 1 spray into the nose as needed.   NON FORMULARY DIET: REGULAR, THIN LIQUIDS   promethazine (PHENERGAN) 25 MG tablet Take 25 mg by mouth every 6 (six) hours as needed for nausea or vomiting.   senna-docusate (SENOKOT-S) 8.6-50 MG tablet Take 1 tablet by mouth 2 (two) times  daily.   Sodium Phosphates (RA SALINE ENEMA RE) Place rectally as needed.   vitamin C (ASCORBIC ACID) 500 MG tablet Take 500 mg by mouth 2 (two) times daily.   No facility-administered encounter medications on file as of 12/21/2021.    Review of Systems  Constitutional:  Negative for appetite change, chills, fatigue and fever.  HENT:  Negative for congestion, hearing loss, rhinorrhea and sore throat.   Eyes: Negative.   Respiratory:  Negative for cough, shortness of breath and wheezing.   Cardiovascular:  Positive for leg swelling. Negative for chest pain and palpitations.  Gastrointestinal:  Negative for abdominal pain, constipation, diarrhea, nausea and vomiting.  Genitourinary:  Negative for dysuria.  Skin:  Negative for color change, rash  and wound.  Neurological:  Negative for dizziness, weakness and headaches.  Psychiatric/Behavioral:  Negative for behavioral problems. The patient is not nervous/anxious.       Immunization History  Administered Date(s) Administered   Fluad Quad(high Dose 65+) 05/04/2021   Influenza Split 03/08/2013   Influenza Whole 09/03/2009, 06/03/2010   Influenza,inj,Quad PF,6+ Mos 06/25/2014   Influenza-Unspecified 03/25/2018, 05/06/2019, 05/14/2020   Moderna SARS-COV2 Booster Vaccination 07/22/2021   Moderna Sars-Covid-2 Vaccination 08/12/2019, 09/09/2019, 07/28/2020   Pneumococcal Polysaccharide-23 07/25/2002, 10/31/2017   Pneumococcal-Unspecified 05/07/2019   Td 07/25/2005   Tdap 05/03/2019   Unspecified SARS-COV-2 Vaccination 07/28/2020   Zoster Recombinat (Shingrix) 06/25/2021   Pertinent  Health Maintenance Due  Topic Date Due   OPHTHALMOLOGY EXAM  09/08/2019   FOOT EXAM  01/31/2022 (Originally 02/18/2021)   MAMMOGRAM  09/13/2022 (Originally 01/14/2017)   COLONOSCOPY (Pts 45-13yr Insurance coverage will need to be confirmed)  09/13/2022 (Originally 09/02/1997)   HEMOGLOBIN A1C  02/15/2022   INFLUENZA VACCINE  02/22/2022   DEXA SCAN  Completed       06/04/2018    9:00 AM 06/04/2018   11:05 PM 02/21/2019    6:50 PM 05/22/2020   11:27 AM 09/13/2021    4:11 PM  Fall Risk  Falls in the past year?    1 0  Number of falls in past year - Comments   Emmi Telephone Survey Actual Response =     Was there an injury with Fall?    0 0  Fall Risk Category Calculator    2 0  Fall Risk Category    Moderate Low  Patient Fall Risk Level High fall risk High fall risk  Moderate fall risk Low fall risk  Patient at Risk for Falls Due to    History of fall(s);Impaired balance/gait;Impaired mobility Impaired balance/gait  Fall risk Follow up    Falls evaluation completed;Education provided;Falls prevention discussed Education provided;Falls prevention discussed     Vitals:   12/21/21 1152  BP: 111/69  Pulse: 70  Resp: 17  Temp: 98.3 F (36.8 C)  SpO2: 98%  Weight: 181 lb 9.6 oz (82.4 kg)  Height: '5\' 1"'$  (1.549 m)   Body mass index is 34.31 kg/m.  Physical Exam Constitutional:      General: She is in acute distress.     Appearance: She is obese.  HENT:     Head: Normocephalic and atraumatic.     Nose: Nose normal.     Mouth/Throat:     Mouth: Mucous membranes are moist.  Eyes:     Conjunctiva/sclera: Conjunctivae normal.  Cardiovascular:     Rate and Rhythm: Normal rate and regular rhythm.  Pulmonary:     Effort: Pulmonary effort is normal.     Breath sounds: Normal breath sounds.  Abdominal:     General: Bowel sounds are normal.     Palpations: Abdomen is soft.  Musculoskeletal:        General: Normal range of motion.     Cervical back: Normal range of motion.     Comments: BLE 2+edema  Skin:    General: Skin is warm and dry.  Neurological:     Mental Status: She is alert. Mental status is at baseline. She is disoriented.     Comments: Alert to self, disoriented to time and place.  Psychiatric:        Mood and Affect: Mood normal.        Behavior: Behavior normal.       Labs reviewed:  Recent Labs     04/14/21 0000 07/14/21 0000 11/16/21 0000  NA 142 145 140  K 4.0 4.1 3.9  CL 102 105 106  CO2 31* 28* 26*  BUN 16 27* 21  CREATININE 1.4* 1.9* 1.4*  CALCIUM 9.6 9.4 9.2   Recent Labs    07/14/21 0000  AST 12*  ALT 9  ALKPHOS 91  ALBUMIN 3.3*   Recent Labs    07/14/21 0000 09/08/21 0000 11/16/21 0000  WBC 10.6 11.7 9.4  NEUTROABS  --  8.40  --   HGB 8.7* 9.4* 8.6*  HCT 29* 30* 26*  PLT 526* 507* 436*   Lab Results  Component Value Date   TSH 4.22 09/30/2020   Lab Results  Component Value Date   HGBA1C 5.9 11/16/2021   Lab Results  Component Value Date   CHOL 178 08/30/2012   HDL 49 08/30/2012   LDLCALC 92 08/30/2012   TRIG 187 (H) 08/30/2012   CHOLHDL 3.6 08/30/2012    Significant Diagnostic Results in last 30 days:  No results found.  Assessment/Plan  1. ACP (advance care planning) -   remains to be full code -   Discussed discussed medications, vital signs and weights  2. Seizure disorder ([place]) -Note episodes of seizures for the past 3 months, -   Continue levetiracetam   3. Hypertension associated with diabetes ([place]) -Blood pressure well controlled Continue current medications  4. Moderate asthma without complication, unspecified whether persistent -No wheezing/SOB, continue Advair and PRN albuterol  5. Type 2 DM with CKD stage 3 and hypertension ([place]) Lab Results  Component Value Date   HGBA1C 5.9 11/16/2021   -   Continue metformin and CBG checks  6. Severe early onset Alzheimer's dementia without behavioral disturbance, psychotic disturbance, mood disturbance, or anxiety ([place]) -   BIMS score 7/15, ranging in severe cognitive impairment -    Continue supportive care    Family/ staff Communication: Discussed plan of care with resident and charge nurse.  Labs/tests ordered:   None    Durenda Age, DNP, MSN, FNP-BC [facility] [phone] (Monday-Friday 8:00 a.m. - 5:00 p.m.) [phone]  (after hours)

## 2022-07-27 ENCOUNTER — Encounter (HOSPITAL_COMMUNITY): Payer: Self-pay

## 2022-07-27 ENCOUNTER — Emergency Department (HOSPITAL_COMMUNITY): Payer: Medicare Other

## 2022-07-27 ENCOUNTER — Other Ambulatory Visit: Payer: Self-pay

## 2022-07-27 ENCOUNTER — Emergency Department (HOSPITAL_COMMUNITY)
Admission: EM | Admit: 2022-07-27 | Discharge: 2022-07-28 | Disposition: A | Discharge status: Skilled Nursing Facility | Payer: Medicare Other | Attending: Emergency Medicine | Admitting: Emergency Medicine

## 2022-07-27 DIAGNOSIS — R000 Tachycardia, unspecified: Secondary | ICD-10-CM | POA: Diagnosis not present

## 2022-07-27 DIAGNOSIS — M47816 Spondylosis without myelopathy or radiculopathy, lumbar region: Secondary | ICD-10-CM | POA: Diagnosis not present

## 2022-07-27 DIAGNOSIS — N3000 Acute cystitis without hematuria: Secondary | ICD-10-CM | POA: Diagnosis not present

## 2022-07-27 DIAGNOSIS — M47814 Spondylosis without myelopathy or radiculopathy, thoracic region: Secondary | ICD-10-CM | POA: Insufficient documentation

## 2022-07-27 DIAGNOSIS — R531 Weakness: Secondary | ICD-10-CM | POA: Diagnosis present

## 2022-07-27 LAB — COMPREHENSIVE METABOLIC PANEL
ALT: 21 U/L (ref 0–44)
AST: 29 U/L (ref 15–41)
Albumin: 2.4 g/dL — ABNORMAL LOW (ref 3.5–5.0)
Alkaline Phosphatase: 73 U/L (ref 38–126)
Anion gap: 9 (ref 5–15)
BUN: 26 mg/dL — ABNORMAL HIGH (ref 8–23)
CO2: 22 mmol/L (ref 22–32)
Calcium: 9 mg/dL (ref 8.9–10.3)
Chloride: 108 mmol/L (ref 98–111)
Creatinine, Ser: 1.95 mg/dL — ABNORMAL HIGH (ref 0.44–1.00)
GFR, Estimated: 27 mL/min — ABNORMAL LOW (ref >60)
Glucose, Bld: 94 mg/dL (ref 70–99)
Potassium: 4.5 mmol/L (ref 3.5–5.1)
Sodium: 139 mmol/L (ref 135–145)
Total Bilirubin: 0.5 mg/dL (ref 0.3–1.2)
Total Protein: 8.3 g/dL — ABNORMAL HIGH (ref 6.5–8.1)

## 2022-07-27 LAB — CBC WITH DIFFERENTIAL/PLATELET
Abs Immature Granulocytes: 0.08 10*3/uL — ABNORMAL HIGH (ref 0.00–0.07)
Basophils Absolute: 0 10*3/uL (ref 0.0–0.1)
Basophils Relative: 0 %
Eosinophils Absolute: 0.4 10*3/uL (ref 0.0–0.5)
Eosinophils Relative: 3 %
HCT: 32.6 % — ABNORMAL LOW (ref 36.0–46.0)
Hemoglobin: 10.1 g/dL — ABNORMAL LOW (ref 12.0–15.0)
Immature Granulocytes: 1 %
Lymphocytes Relative: 14 %
Lymphs Abs: 1.6 10*3/uL (ref 0.7–4.0)
MCH: 28.2 pg (ref 26.0–34.0)
MCHC: 31 g/dL (ref 30.0–36.0)
MCV: 91.1 fL (ref 80.0–100.0)
Monocytes Absolute: 0.8 10*3/uL (ref 0.1–1.0)
Monocytes Relative: 8 %
Neutro Abs: 8.1 10*3/uL — ABNORMAL HIGH (ref 1.7–7.7)
Neutrophils Relative %: 74 %
Platelets: 592 10*3/uL — ABNORMAL HIGH (ref 150–400)
RBC: 3.58 MIL/uL — ABNORMAL LOW (ref 3.87–5.11)
RDW: 15.6 % — ABNORMAL HIGH (ref 11.5–15.5)
WBC: 11 10*3/uL — ABNORMAL HIGH (ref 4.0–10.5)
nRBC: 0 % (ref 0.0–0.2)

## 2022-07-27 LAB — CK: Total CK: 257 U/L — ABNORMAL HIGH (ref 38–234)

## 2022-07-27 NOTE — ED Notes (Signed)
Called for vitals. No response

## 2022-07-27 NOTE — ED Notes (Signed)
No response

## 2022-07-27 NOTE — ED Notes (Signed)
Patient refuses to provide urine sample

## 2022-07-27 NOTE — ED Triage Notes (Signed)
Arrives EMS from [facility].   Unwitnessed fall. Has recurrent falls/ slides from wheelchair due to generalized weakness and painful legs.   After fall endorses right hip pain. Alert to person and place.

## 2022-07-27 NOTE — ED Provider Triage Note (Signed)
Emergency Medicine Provider Triage Evaluation Note  Mariah [name] , [name] 72 y.o. female  was evaluated in triage.  Pt complains of Fall.  From [facility]  Mechanical Fall, unsure per facility? HR 135 with EMS. No thinners.  Some confusion per EMS however appear to be at baseline per facility. Recurrent falls per facility. Some lower back pain, right hip pain  Review of Systems  Positive: Fall Negative: fever  Physical Exam  LMP 12/19/2017  Gen:   Awake, no distress   Resp:  Normal effort  MSK:   Moves extremities without difficulty  Other:    Medical Decision Making  Medically screening exam initiated at 6:53 AM.  Appropriate orders placed.  Mariah [name] was informed that the remainder of the evaluation will be completed by another provider, this initial triage assessment does not replace that evaluation, and the importance of remaining in the ED until their evaluation is complete.  [address] Mariah [name], [place] 07/27/22 [phone]

## 2022-07-27 NOTE — ED Notes (Addendum)
Pt was stuck twice and wasn't successful. Wasn't even bleeding afterwards. Try again in the back.

## 2022-07-27 NOTE — ED Notes (Signed)
Phlebotomy requested for pending labs

## 2022-07-28 DIAGNOSIS — N3000 Acute cystitis without hematuria: Secondary | ICD-10-CM | POA: Diagnosis not present

## 2022-07-28 LAB — URINALYSIS, ROUTINE W REFLEX MICROSCOPIC
Bilirubin Urine: NEGATIVE
Glucose, UA: NEGATIVE mg/dL
Ketones, ur: NEGATIVE mg/dL
Nitrite: POSITIVE — AB
Protein, ur: 30 mg/dL — AB
RBC / HPF: >50 RBC/hpf — ABNORMAL HIGH (ref 0–5)
Specific Gravity, Urine: 1.018 (ref 1.005–1.030)
WBC, UA: >50 WBC/hpf — ABNORMAL HIGH (ref 0–5)
pH: 5 (ref 5.0–8.0)

## 2022-07-28 LAB — BRAIN NATRIURETIC PEPTIDE: B Natriuretic Peptide: 90.4 pg/mL (ref 0.0–100.0)

## 2022-07-28 MED ORDER — CEPHALEXIN 500 MG PO CAPS: 500.0000 mg | ORAL_CAPSULE | Freq: Four times a day (QID) | ORAL | 0 refills | Status: DC

## 2022-07-28 MED ORDER — CEPHALEXIN 250 MG PO CAPS
1000.0000 mg | ORAL_CAPSULE | Freq: Once | ORAL | Status: AC
  Administered 2022-07-28: 1000 mg via ORAL
  Filled 2022-07-28: qty 4

## 2022-07-28 NOTE — ED Notes (Signed)
Pt's niece Ardelle Park updated with pt's care and d/c instructions. Pt will be transferred back to [facility] via [place].

## 2022-07-28 NOTE — ED Notes (Signed)
Patient verbalizes understanding of d/c instructions. Opportunities for questions and answers were provided. Pt d/c from ED and transferred back to [facility] via [place].

## 2022-07-28 NOTE — ED Notes (Signed)
Attempted to call [facility] to give report, no response.

## 2022-07-28 NOTE — ED Notes (Signed)
Ptar called 

## 2022-07-28 NOTE — ED Notes (Signed)
Pt ambulated to restroom x2 person assist. Pt would benefit with a walker and assistance in and out of bed. Pt had no c/o and preferred ambulating vs WC use.

## 2022-07-28 NOTE — ED Notes (Signed)
Attempted to call [facility] to give report, no answer at this time.

## 2022-07-29 LAB — URINE CULTURE

## 2022-07-29 LAB — LEVETIRACETAM LEVEL: Levetiracetam Lvl: 13.9 ug/mL (ref 10.0–40.0)

## 2022-07-29 NOTE — ED Provider Notes (Signed)
[facility] EMERGENCY DEPARTMENT Provider Note   CSN: [id number] Arrival date & time: 07/27/22  4098     History  Chief Complaint  Patient presents with   Lytle Michaels    Mariah [name] is a 72 y.o. female.  72 year old female who presents ER today after a fall.  Patient does not remember it but report from EMS that was obtained from her nursing facility is that she falls often secondary to pain in her legs and generalized weakness.  She always falls today so they sent her out to be evaluated.  Patient has no complaints at this time.  She feels well.  She is limited by mental capacity.   Fall       Home Medications Prior to Admission medications   Medication Sig Start Date End Date Taking? Authorizing Provider  cephALEXin (KEFLEX) 500 MG capsule Take 1 capsule (500 mg total) by mouth 4 (four) times daily. 07/28/22  Yes Jahlani Lorentz, Corene Cornea, MD  acetaminophen (TYLENOL) 650 MG CR tablet Take 650 mg by mouth every 6 (six) hours as needed for pain.    [provider]  ADVAIR DISKUS 100-50 MCG/DOSE AEPB Inhale 1 puff into the lungs 2 (two) times daily. 06/26/18   [name], MD  albuterol (PROVENTIL HFA;VENTOLIN HFA) 108 (90 Base) MCG/ACT inhaler Inhale 2 puffs into the lungs every 6 (six) hours as needed for wheezing or shortness of breath. 06/26/18   [name], MD  barrier cream (NON-SPECIFIED) CREA Apply 1 application topically 2 (two) times daily as needed.    [provider]  bisacodyl (DULCOLAX) 10 MG suppository Constipation (2 of 4): If not relieved by MOM, give 10 mg Bisacodyl suppositiory rectally X 1 dose in 24 hours as needed (Do not use constipation standing orders for residents with renal failure/CFR less than 30. Contact MD for orders)    [provider]  cetaphil (CETAPHIL) cream Apply 1 application topically at bedtime. Apply to bilateral lower legs for dry skin.    [provider]  Cholecalciferol (VITAMIN D3) 125 MCG (5000  UT) CAPS Take 1 capsule by mouth daily.     [provider]  esomeprazole (NEXIUM) 20 MG capsule Take 1 capsule (20 mg total) by mouth every morning. For acid reflex 06/27/18   [name], MD  ferrous sulfate 325 (65 FE) MG tablet Take 325 mg by mouth in the morning and at bedtime.    [provider]  furosemide (LASIX) 40 MG tablet TAKE ONE TABLET BY MOUTH EVERY DAY 02/27/18   Axel Filler, MD  hydrALAZINE (APRESOLINE) 25 MG tablet Take 25 mg by mouth 3 (three) times daily.     [provider]  levETIRAcetam (KEPPRA) 500 MG tablet Take 1 tablet (500 mg total) by mouth 2 (two) times daily. 05/26/18   [name], MD  loperamide (IMODIUM A-D) 2 MG tablet Take 2 mg by mouth as needed for diarrhea or loose stools. Give after each stool prn, not to exceed 8 mg in 24 hours    [provider]  losartan (COZAAR) 100 MG tablet Take 100 mg by mouth daily.    [provider]  magnesium hydroxide (MILK OF MAGNESIA) 400 MG/5ML suspension Constipation (1 of 4): If no BM in 3 days, give 30 cc Milk of Magnesium p.o. x 1 dose in 24 hours as needed (Do not use standing constipation orders for residents with renal failure CFR less than 30. Contact MD for orders)    [provider]  metFORMIN (GLUCOPHAGE) 500 MG tablet Take 500 mg by mouth 2 (two) times daily with a meal.    [provider]  metoprolol succinate (TOPROL-XL) 50 MG 24 hr tablet Take 50 mg by mouth 2 (two) times daily.     [provider]  mometasone (NASONEX) 50 MCG/ACT nasal spray Place 1 spray into the nose as needed. 12/12/17   [name], MD  NON FORMULARY DIET: REGULAR, [place]    [provider]  promethazine (PHENERGAN) 25 MG tablet Take 25 mg by mouth every 6 (six) hours as needed for nausea or vomiting.    [provider]  senna-docusate (SENOKOT-S) 8.6-50 MG tablet Take 1 tablet by mouth 2 (two) times daily.    [provider]   Sodium Phosphates (RA SALINE ENEMA RE) Place rectally as needed.    [provider]  vitamin C (ASCORBIC ACID) 500 MG tablet Take 500 mg by mouth 2 (two) times daily.    [provider]      Allergies    Penicillins, Naproxen, Codeine, and Indomethacin    Review of Systems   Review of Systems  Physical Exam Updated Vital Signs BP 111/65   Pulse 60   Temp (!) 97.5 F (36.4 C) (Oral)   Resp 16   LMP 12/19/2017   SpO2 100%  Physical Exam Vitals and nursing note reviewed.  Constitutional:      Appearance: She is well-developed.  HENT:     Head: Normocephalic and atraumatic.  Eyes:     Pupils: Pupils are equal, round, and reactive to light.  Cardiovascular:     Rate and Rhythm: Normal rate and regular rhythm.  Pulmonary:     Effort: No respiratory distress.     Breath sounds: No stridor.  Abdominal:     General: Abdomen is flat. There is no distension.  Musculoskeletal:     Cervical back: Normal range of motion.  Skin:    General: Skin is warm and dry.  Neurological:     General: No focal deficit present.     Mental Status: She is alert.     ED Results / Procedures / Treatments   Labs (all labs ordered are listed, but only abnormal results are displayed) Labs Reviewed  CBC WITH DIFFERENTIAL/PLATELET - Abnormal; Notable for the following components:      Result Value   WBC 11.0 (*)    RBC 3.58 (*)    Hemoglobin 10.1 (*)    HCT 32.6 (*)    RDW 15.6 (*)    Platelets 592 (*)    Neutro Abs 8.1 (*)    Abs Immature Granulocytes 0.08 (*)    All other components within normal limits  COMPREHENSIVE METABOLIC PANEL - Abnormal; Notable for the following components:   BUN 26 (*)    Creatinine, Ser 1.95 (*)    Total Protein 8.3 (*)    Albumin 2.4 (*)    GFR, Estimated 27 (*)    All other components within normal limits  URINALYSIS, ROUTINE W REFLEX MICROSCOPIC - Abnormal; Notable for the following components:   Color, Urine AMBER (*)    APPearance  CLOUDY (*)    Hgb urine dipstick LARGE (*)    Protein, ur 30 (*)    Nitrite POSITIVE (*)    Leukocytes,Ua LARGE (*)    RBC / HPF >50 (*)    WBC, UA >50 (*)    Bacteria, UA MANY (*)    All other  components within normal limits  CK - Abnormal; Notable for the following components:   Total CK 257 (*)    All other components within normal limits  URINE CULTURE  BRAIN NATRIURETIC PEPTIDE  LEVETIRACETAM LEVEL    EKG EKG Interpretation  Date/Time:  Wednesday July 27 2022 07:00:35 EST Ventricular Rate:  127 PR Interval:  128 QRS Duration: 58 QT Interval:  288 QTC Calculation: 418 R Axis:   -2 Text Interpretation: Sinus tachycardia Low voltage QRS Septal infarct , age undetermined Abnormal ECG When compared with ECG of 31-May-2018 13:01, PREVIOUS ECG IS PRESENT Confirmed by Malvin Johns [phone]) on 07/27/2022 8:24:48 AM  Radiology CT HEAD WO CONTRAST (5MM)  Result Date: 07/27/2022 CLINICAL DATA:  Trauma EXAM: CT HEAD WITHOUT CONTRAST CT CERVICAL SPINE WITHOUT CONTRAST TECHNIQUE: Multidetector CT imaging of the head and cervical spine was performed following the standard protocol without intravenous contrast. Multiplanar CT image reconstructions of the cervical spine were also generated. RADIATION DOSE REDUCTION: This exam was performed according to the departmental dose-optimization program which includes automated exposure control, adjustment of the mA and/or kV according to patient size and/or use of iterative reconstruction technique. COMPARISON:  CT head and cervical spine 10/30/2017 and CT head 05/31/2018 FINDINGS: CT HEAD FINDINGS Brain: There is no acute intracranial hemorrhage, extra-axial fluid collection, or acute infarct. Parenchymal volume is stable and within normal limits. The ventricles are normal in size. Patchy hypodensity in the supratentorial white matter likely reflecting sequela of chronic small-vessel ischemic change is stable. There is a remote infarct in the right  thalamic capsular region which is new since 2019. The known pituitary macro adenoma is grossly similar to 2019, though suboptimally evaluated on the current study. There is no new solid mass lesion. There is no midline shift. Vascular: There is calcification of the bilateral carotid siphons. Skull: Normal. Negative for fracture or focal lesion. Sinuses/Orbits: There has been interval endoscopic sinus surgery with mild mucosal thickening in the imaged portions of the postsurgical sinonasal cavity. The imaged globes and orbits are unremarkable. Other: None. CT CERVICAL SPINE FINDINGS Alignment: There is reversal of the normal cervical curvature centered at C5, similar to 2019. There is no jumped or perched facet or other evidence of traumatic malalignment. Skull base and vertebrae: Skull base alignment is maintained. Vertebral body heights are preserved. There is no evidence of acute fracture. There is no suspicious osseous lesion. There is ankylosis of the right posterior elements at C2-C3, unchanged. There is no suspicious osseous lesion. Soft tissues and spinal canal: No prevertebral fluid or swelling. No visible canal hematoma. Disc levels: There is disc space narrowing and degenerative endplate change most advanced at C5-C6 and C6-C7. There is overall mild-to-moderate multilevel facet arthropathy. There is a disc protrusion at C4-C5 with least mild-to-moderate spinal canal stenosis. Upper chest: The imaged lung apices are clear. Other: The midline thyroid mass measuring up to 3.6 cm is similar to 2019. IMPRESSION: 1. No acute intracranial pathology. 2. No acute fracture or traumatic malalignment of the cervical spine. 3. Remote appearing right thalamic capsular infarct, new since 2019. 4. Grossly stable pituitary macro adenoma. 5. Similar-appearing 3.6 cm midline thyroid mass. If not already performed, recommend nonemergent thyroid ultrasound. Electronically Signed   By: Valetta Mole M.D.   On: 07/27/2022 08:36    CT Cervical Spine Wo Contrast  Result Date: 07/27/2022 CLINICAL DATA:  Trauma EXAM: CT HEAD WITHOUT CONTRAST CT CERVICAL SPINE WITHOUT CONTRAST TECHNIQUE: Multidetector CT imaging of the head and cervical spine was  performed following the standard protocol without intravenous contrast. Multiplanar CT image reconstructions of the cervical spine were also generated. RADIATION DOSE REDUCTION: This exam was performed according to the departmental dose-optimization program which includes automated exposure control, adjustment of the mA and/or kV according to patient size and/or use of iterative reconstruction technique. COMPARISON:  CT head and cervical spine 10/30/2017 and CT head 05/31/2018 FINDINGS: CT HEAD FINDINGS Brain: There is no acute intracranial hemorrhage, extra-axial fluid collection, or acute infarct. Parenchymal volume is stable and within normal limits. The ventricles are normal in size. Patchy hypodensity in the supratentorial white matter likely reflecting sequela of chronic small-vessel ischemic change is stable. There is a remote infarct in the right thalamic capsular region which is new since 2019. The known pituitary macro adenoma is grossly similar to 2019, though suboptimally evaluated on the current study. There is no new solid mass lesion. There is no midline shift. Vascular: There is calcification of the bilateral carotid siphons. Skull: Normal. Negative for fracture or focal lesion. Sinuses/Orbits: There has been interval endoscopic sinus surgery with mild mucosal thickening in the imaged portions of the postsurgical sinonasal cavity. The imaged globes and orbits are unremarkable. Other: None. CT CERVICAL SPINE FINDINGS Alignment: There is reversal of the normal cervical curvature centered at C5, similar to 2019. There is no jumped or perched facet or other evidence of traumatic malalignment. Skull base and vertebrae: Skull base alignment is maintained. Vertebral body heights are  preserved. There is no evidence of acute fracture. There is no suspicious osseous lesion. There is ankylosis of the right posterior elements at C2-C3, unchanged. There is no suspicious osseous lesion. Soft tissues and spinal canal: No prevertebral fluid or swelling. No visible canal hematoma. Disc levels: There is disc space narrowing and degenerative endplate change most advanced at C5-C6 and C6-C7. There is overall mild-to-moderate multilevel facet arthropathy. There is a disc protrusion at C4-C5 with least mild-to-moderate spinal canal stenosis. Upper chest: The imaged lung apices are clear. Other: The midline thyroid mass measuring up to 3.6 cm is similar to 2019. IMPRESSION: 1. No acute intracranial pathology. 2. No acute fracture or traumatic malalignment of the cervical spine. 3. Remote appearing right thalamic capsular infarct, new since 2019. 4. Grossly stable pituitary macro adenoma. 5. Similar-appearing 3.6 cm midline thyroid mass. If not already performed, recommend nonemergent thyroid ultrasound. Electronically Signed   By: Valetta Mole M.D.   On: 07/27/2022 08:36   DG Chest 2 View  Result Date: 07/27/2022 CLINICAL DATA:  Fall EXAM: CHEST - 2 VIEW COMPARISON:  Radiograph 05/31/2018 FINDINGS: Unchanged cardiomediastinal silhouette. There is no focal airspace consolidation. There is no pleural effusion or evidence of pneumothorax. There is no acute osseous abnormality. Thoracic spondylosis. IMPRESSION: No evidence of acute cardiopulmonary disease. Electronically Signed   By: Maurine Simmering M.D.   On: 07/27/2022 08:10   DG Hip Unilat W or Wo Pelvis 2-3 Views Right  Result Date: 07/27/2022 CLINICAL DATA:  Fall EXAM: DG HIP (WITH OR WITHOUT PELVIS) 2-3V RIGHT COMPARISON:  None Available. FINDINGS: There is no evidence of acute hip fracture. There are mild degenerative changes of the hip. Pelvic calcifications likely calcified uterine fibroids IMPRESSION: No evidence of acute right hip fracture. Mild right  hip osteoarthritis. Electronically Signed   By: Maurine Simmering M.D.   On: 07/27/2022 08:08   DG Lumbar Spine Complete  Result Date: 07/27/2022 CLINICAL DATA:  Fall EXAM: LUMBAR SPINE - COMPLETE 4+ VIEW COMPARISON:  None Available. FINDINGS: There is no  evidence of lumbar spine fracture. There is grade 1 degenerative anterolisthesis at L5-S1. There is mild multilevel degenerative disc disease most prominent L4-L5 and L5-S1. There is mild to moderate lower lumbar predominant facet arthropathy. IMPRESSION: No evidence of lumbar spine fracture. Mild multilevel degenerative disc disease most prominent at L4-L5 and L5-S1. Mild to moderate lower lumbar predominant facet arthropathy. Degenerative grade 1 anterolisthesis at L5-S1. Electronically Signed   By: Maurine Simmering M.D.   On: 07/27/2022 08:05   DG Thoracic Spine 2 View  Result Date: 07/27/2022 CLINICAL DATA:  Fall EXAM: THORACIC SPINE 2 VIEWS COMPARISON:  Two-view chest radiograph 10/30/2017 FINDINGS: There is no radiographic evidence of thoracic spine fracture. Slight dextroconvex curvature. There are moderate multilevel degenerative changes. IMPRESSION: No evidence of thoracic spine fracture. Moderate multilevel degenerative changes. Electronically Signed   By: Maurine Simmering M.D.   On: 07/27/2022 08:04    Procedures Procedures    Medications Ordered in ED Medications  cephALEXin (KEFLEX) capsule 1,000 mg (1,000 mg Oral Given 07/28/22 0503)    ED Course/ Medical Decision Making/ A&P                           Medical Decision Making Amount and/or Complexity of Data Reviewed Labs: ordered.  Risk Prescription drug management.   Patient found to have UTI.  This was treated.  She is tolerates p.o.  She is able to ambulate at her baseline.  I do not see any indication for admission at this time.  She can be discharged back to facility on antibiotics and follow-up with her PCP for further management.   Final Clinical Impression(s) / ED Diagnoses Final  diagnoses:  Acute cystitis without hematuria    Rx / DC Orders ED Discharge Orders          Ordered    cephALEXin (KEFLEX) 500 MG capsule  4 times daily        07/28/22 0523              Emmanuel Ercole, Corene Cornea, MD 07/29/22 0981

## 2022-11-23 ENCOUNTER — Emergency Department (HOSPITAL_COMMUNITY): Payer: Medicare Other

## 2022-11-23 ENCOUNTER — Inpatient Hospital Stay (HOSPITAL_COMMUNITY)
Admission: EM | Admit: 2022-11-23 | Discharge: 2022-11-26 | DRG: 871 | Disposition: A | Discharge status: Skilled Nursing Facility | Payer: Medicare Other | Source: Skilled Nursing Facility | Attending: Internal Medicine | Admitting: Internal Medicine

## 2022-11-23 ENCOUNTER — Other Ambulatory Visit: Payer: Self-pay

## 2022-11-23 ENCOUNTER — Encounter (HOSPITAL_COMMUNITY): Payer: Self-pay | Admitting: Internal Medicine

## 2022-11-23 DIAGNOSIS — E669 Obesity, unspecified: Secondary | ICD-10-CM | POA: Diagnosis present

## 2022-11-23 DIAGNOSIS — I10 Essential (primary) hypertension: Secondary | ICD-10-CM | POA: Diagnosis not present

## 2022-11-23 DIAGNOSIS — K529 Noninfective gastroenteritis and colitis, unspecified: Secondary | ICD-10-CM | POA: Diagnosis present

## 2022-11-23 DIAGNOSIS — E1151 Type 2 diabetes mellitus with diabetic peripheral angiopathy without gangrene: Secondary | ICD-10-CM | POA: Diagnosis present

## 2022-11-23 DIAGNOSIS — K219 Gastro-esophageal reflux disease without esophagitis: Secondary | ICD-10-CM | POA: Diagnosis not present

## 2022-11-23 DIAGNOSIS — Z888 Allergy status to other drugs, medicaments and biological substances status: Secondary | ICD-10-CM

## 2022-11-23 DIAGNOSIS — R6520 Severe sepsis without septic shock: Secondary | ICD-10-CM | POA: Diagnosis present

## 2022-11-23 DIAGNOSIS — E119 Type 2 diabetes mellitus without complications: Secondary | ICD-10-CM | POA: Diagnosis not present

## 2022-11-23 DIAGNOSIS — Z809 Family history of malignant neoplasm, unspecified: Secondary | ICD-10-CM

## 2022-11-23 DIAGNOSIS — J45909 Unspecified asthma, uncomplicated: Secondary | ICD-10-CM | POA: Diagnosis present

## 2022-11-23 DIAGNOSIS — Z886 Allergy status to analgesic agent status: Secondary | ICD-10-CM

## 2022-11-23 DIAGNOSIS — R4182 Altered mental status, unspecified: Secondary | ICD-10-CM

## 2022-11-23 DIAGNOSIS — G40909 Epilepsy, unspecified, not intractable, without status epilepticus: Secondary | ICD-10-CM | POA: Diagnosis present

## 2022-11-23 DIAGNOSIS — I872 Venous insufficiency (chronic) (peripheral): Secondary | ICD-10-CM | POA: Diagnosis present

## 2022-11-23 DIAGNOSIS — R791 Abnormal coagulation profile: Secondary | ICD-10-CM | POA: Diagnosis present

## 2022-11-23 DIAGNOSIS — E1122 Type 2 diabetes mellitus with diabetic chronic kidney disease: Secondary | ICD-10-CM | POA: Diagnosis present

## 2022-11-23 DIAGNOSIS — J189 Pneumonia, unspecified organism: Secondary | ICD-10-CM | POA: Diagnosis present

## 2022-11-23 DIAGNOSIS — N179 Acute kidney failure, unspecified: Secondary | ICD-10-CM | POA: Diagnosis present

## 2022-11-23 DIAGNOSIS — F0390 Unspecified dementia without behavioral disturbance: Secondary | ICD-10-CM | POA: Diagnosis present

## 2022-11-23 DIAGNOSIS — G9341 Metabolic encephalopathy: Secondary | ICD-10-CM | POA: Insufficient documentation

## 2022-11-23 DIAGNOSIS — D75839 Thrombocytosis, unspecified: Secondary | ICD-10-CM | POA: Diagnosis present

## 2022-11-23 DIAGNOSIS — A419 Sepsis, unspecified organism: Secondary | ICD-10-CM | POA: Diagnosis present

## 2022-11-23 DIAGNOSIS — Z8049 Family history of malignant neoplasm of other genital organs: Secondary | ICD-10-CM

## 2022-11-23 DIAGNOSIS — Z6831 Body mass index (BMI) 31.0-31.9, adult: Secondary | ICD-10-CM | POA: Diagnosis not present

## 2022-11-23 DIAGNOSIS — Z7951 Long term (current) use of inhaled steroids: Secondary | ICD-10-CM

## 2022-11-23 DIAGNOSIS — E079 Disorder of thyroid, unspecified: Secondary | ICD-10-CM

## 2022-11-23 DIAGNOSIS — I129 Hypertensive chronic kidney disease with stage 1 through stage 4 chronic kidney disease, or unspecified chronic kidney disease: Secondary | ICD-10-CM | POA: Diagnosis present

## 2022-11-23 DIAGNOSIS — Z1152 Encounter for screening for COVID-19: Secondary | ICD-10-CM

## 2022-11-23 DIAGNOSIS — Z885 Allergy status to narcotic agent status: Secondary | ICD-10-CM

## 2022-11-23 DIAGNOSIS — J90 Pleural effusion, not elsewhere classified: Secondary | ICD-10-CM | POA: Diagnosis present

## 2022-11-23 DIAGNOSIS — Z880 Allergy status to penicillin: Secondary | ICD-10-CM | POA: Diagnosis not present

## 2022-11-23 DIAGNOSIS — Z8249 Family history of ischemic heart disease and other diseases of the circulatory system: Secondary | ICD-10-CM

## 2022-11-23 DIAGNOSIS — N183 Chronic kidney disease, stage 3 unspecified: Secondary | ICD-10-CM | POA: Diagnosis present

## 2022-11-23 DIAGNOSIS — Z820 Family history of epilepsy and other diseases of the nervous system: Secondary | ICD-10-CM

## 2022-11-23 DIAGNOSIS — Z79899 Other long term (current) drug therapy: Secondary | ICD-10-CM

## 2022-11-23 LAB — LACTIC ACID, PLASMA: Lactic Acid, Venous: 1.2 mmol/L (ref 0.5–1.9)

## 2022-11-23 LAB — COMPREHENSIVE METABOLIC PANEL
ALT: 12 U/L (ref 0–44)
AST: 19 U/L (ref 15–41)
Albumin: 1.9 g/dL — ABNORMAL LOW (ref 3.5–5.0)
Alkaline Phosphatase: 74 U/L (ref 38–126)
Anion gap: 10 (ref 5–15)
BUN: 36 mg/dL — ABNORMAL HIGH (ref 8–23)
CO2: 21 mmol/L — ABNORMAL LOW (ref 22–32)
Calcium: 8.6 mg/dL — ABNORMAL LOW (ref 8.9–10.3)
Chloride: 111 mmol/L (ref 98–111)
Creatinine, Ser: 2.68 mg/dL — ABNORMAL HIGH (ref 0.44–1.00)
GFR, Estimated: 19 mL/min — ABNORMAL LOW (ref >60)
Glucose, Bld: 79 mg/dL (ref 70–99)
Potassium: 4.3 mmol/L (ref 3.5–5.1)
Sodium: 142 mmol/L (ref 135–145)
Total Bilirubin: 0.6 mg/dL (ref 0.3–1.2)
Total Protein: 7.5 g/dL (ref 6.5–8.1)

## 2022-11-23 LAB — CBC WITH DIFFERENTIAL/PLATELET
Abs Immature Granulocytes: 0.09 10*3/uL — ABNORMAL HIGH (ref 0.00–0.07)
Basophils Absolute: 0 10*3/uL (ref 0.0–0.1)
Basophils Relative: 0 %
Eosinophils Absolute: 0.2 10*3/uL (ref 0.0–0.5)
Eosinophils Relative: 1 %
HCT: 27.7 % — ABNORMAL LOW (ref 36.0–46.0)
Hemoglobin: 8.2 g/dL — ABNORMAL LOW (ref 12.0–15.0)
Immature Granulocytes: 1 %
Lymphocytes Relative: 8 %
Lymphs Abs: 1.1 10*3/uL (ref 0.7–4.0)
MCH: 27.2 pg (ref 26.0–34.0)
MCHC: 29.6 g/dL — ABNORMAL LOW (ref 30.0–36.0)
MCV: 92 fL (ref 80.0–100.0)
Monocytes Absolute: 1 10*3/uL (ref 0.1–1.0)
Monocytes Relative: 7 %
Neutro Abs: 11.7 10*3/uL — ABNORMAL HIGH (ref 1.7–7.7)
Neutrophils Relative %: 83 %
Platelets: 581 10*3/uL — ABNORMAL HIGH (ref 150–400)
RBC: 3.01 MIL/uL — ABNORMAL LOW (ref 3.87–5.11)
RDW: 15.6 % — ABNORMAL HIGH (ref 11.5–15.5)
WBC: 14 10*3/uL — ABNORMAL HIGH (ref 4.0–10.5)
nRBC: 0 % (ref 0.0–0.2)

## 2022-11-23 LAB — URINALYSIS, ROUTINE W REFLEX MICROSCOPIC
Bilirubin Urine: NEGATIVE
Glucose, UA: NEGATIVE mg/dL
Hgb urine dipstick: NEGATIVE
Ketones, ur: NEGATIVE mg/dL
Leukocytes,Ua: NEGATIVE
Nitrite: NEGATIVE
Protein, ur: NEGATIVE mg/dL
Specific Gravity, Urine: 1.012 (ref 1.005–1.030)
pH: 5 (ref 5.0–8.0)

## 2022-11-23 LAB — PROTIME-INR
INR: 1.7 — ABNORMAL HIGH (ref 0.8–1.2)
Prothrombin Time: 19.5 seconds — ABNORMAL HIGH (ref 11.4–15.2)

## 2022-11-23 LAB — GLUCOSE, CAPILLARY
Glucose-Capillary: 84 mg/dL (ref 70–99)
Glucose-Capillary: 110 mg/dL — ABNORMAL HIGH (ref 70–99)
Glucose-Capillary: 120 mg/dL — ABNORMAL HIGH (ref 70–99)

## 2022-11-23 LAB — RESP PANEL BY RT-PCR (RSV, FLU A&B, COVID)  RVPGX2
Influenza A by PCR: NEGATIVE
Influenza B by PCR: NEGATIVE
Resp Syncytial Virus by PCR: NEGATIVE
SARS Coronavirus 2 by RT PCR: NEGATIVE

## 2022-11-23 LAB — HEMOGLOBIN A1C
Hgb A1c MFr Bld: 6 % — ABNORMAL HIGH (ref 4.8–5.6)
Mean Plasma Glucose: 125.5 mg/dL

## 2022-11-23 LAB — STREP PNEUMONIAE URINARY ANTIGEN: Strep Pneumo Urinary Antigen: NEGATIVE

## 2022-11-23 LAB — PROCALCITONIN: Procalcitonin: 0.12 ng/mL

## 2022-11-23 LAB — HIV ANTIBODY (ROUTINE TESTING W REFLEX): HIV Screen 4th Generation wRfx: NONREACTIVE

## 2022-11-23 MED ORDER — SODIUM CHLORIDE 0.9 % IV SOLN: INTRAVENOUS | Status: DC

## 2022-11-23 MED ORDER — SODIUM CHLORIDE 0.9% FLUSH: 3.0000 mL | INTRAVENOUS | Status: DC | PRN

## 2022-11-23 MED ORDER — POLYETHYLENE GLYCOL 3350 17 G PO PACK: 17.0000 g | PACK | Freq: Every day | ORAL | Status: DC | PRN

## 2022-11-23 MED ORDER — ALBUTEROL SULFATE (2.5 MG/3ML) 0.083% IN NEBU: 2.5000 mg | INHALATION_SOLUTION | RESPIRATORY_TRACT | Status: DC | PRN

## 2022-11-23 MED ORDER — SODIUM CHLORIDE 0.9 % IV SOLN
1.0000 g | Freq: Once | INTRAVENOUS | Status: AC
  Administered 2022-11-23: 1 g via INTRAVENOUS
  Filled 2022-11-23: qty 10

## 2022-11-23 MED ORDER — LEVETIRACETAM 500 MG PO TABS
500.0000 mg | ORAL_TABLET | Freq: Two times a day (BID) | ORAL | Status: DC
  Administered 2022-11-23 – 2022-11-26 (×7): 500 mg via ORAL
  Filled 2022-11-23 (×7): qty 1

## 2022-11-23 MED ORDER — ACETAMINOPHEN 650 MG RE SUPP: 650.0000 mg | Freq: Four times a day (QID) | RECTAL | Status: DC | PRN

## 2022-11-23 MED ORDER — SODIUM CHLORIDE 0.9 % IV SOLN: 250.0000 mL | INTRAVENOUS | Status: DC | PRN

## 2022-11-23 MED ORDER — ACETAMINOPHEN 500 MG PO TABS
1000.0000 mg | ORAL_TABLET | ORAL | Status: AC
  Administered 2022-11-23: 1000 mg via ORAL
  Filled 2022-11-23: qty 2

## 2022-11-23 MED ORDER — SODIUM CHLORIDE 0.9 % IV SOLN
500.0000 mg | Freq: Once | INTRAVENOUS | Status: AC
  Administered 2022-11-23: 500 mg via INTRAVENOUS
  Filled 2022-11-23: qty 5

## 2022-11-23 MED ORDER — INSULIN ASPART 100 UNIT/ML IJ SOLN: 0.0000 [IU] | Freq: Three times a day (TID) | INTRAMUSCULAR | Status: DC

## 2022-11-23 MED ORDER — INSULIN ASPART 100 UNIT/ML IJ SOLN: 0.0000 [IU] | Freq: Every day | INTRAMUSCULAR | Status: DC

## 2022-11-23 MED ORDER — FLUTICASONE PROPIONATE 50 MCG/ACT NA SUSP
1.0000 | Freq: Every day | NASAL | Status: DC
  Administered 2022-11-24: 1 via NASAL
  Filled 2022-11-23: qty 16

## 2022-11-23 MED ORDER — SODIUM CHLORIDE 0.9 % IV SOLN
500.0000 mg | INTRAVENOUS | Status: DC
  Administered 2022-11-24: 500 mg via INTRAVENOUS
  Filled 2022-11-23: qty 5

## 2022-11-23 MED ORDER — SENNOSIDES-DOCUSATE SODIUM 8.6-50 MG PO TABS: 1.0000 | ORAL_TABLET | Freq: Two times a day (BID) | ORAL | Status: DC | PRN

## 2022-11-23 MED ORDER — ACETAMINOPHEN 325 MG PO TABS
650.0000 mg | ORAL_TABLET | Freq: Four times a day (QID) | ORAL | Status: DC | PRN
  Administered 2022-11-23 – 2022-11-26 (×5): 650 mg via ORAL
  Filled 2022-11-23 (×5): qty 2

## 2022-11-23 MED ORDER — HYDRALAZINE HCL 25 MG PO TABS
25.0000 mg | ORAL_TABLET | Freq: Three times a day (TID) | ORAL | Status: DC
  Administered 2022-11-23 – 2022-11-25 (×4): 25 mg via ORAL
  Filled 2022-11-23 (×7): qty 1

## 2022-11-23 MED ORDER — HEPARIN SODIUM (PORCINE) 5000 UNIT/ML IJ SOLN
5000.0000 [IU] | Freq: Three times a day (TID) | INTRAMUSCULAR | Status: DC
  Administered 2022-11-23 – 2022-11-26 (×10): 5000 [IU] via SUBCUTANEOUS
  Filled 2022-11-23 (×10): qty 1

## 2022-11-23 MED ORDER — VITAMIN D3 125 MCG (5000 UT) PO CAPS: 1.0000 | ORAL_CAPSULE | Freq: Every day | ORAL | Status: DC

## 2022-11-23 MED ORDER — HYDRALAZINE HCL 20 MG/ML IJ SOLN: 10.0000 mg | Freq: Four times a day (QID) | INTRAMUSCULAR | Status: DC | PRN

## 2022-11-23 MED ORDER — LACTATED RINGERS IV BOLUS
1000.0000 mL | Freq: Once | INTRAVENOUS | Status: AC
  Administered 2022-11-23: 1000 mL via INTRAVENOUS

## 2022-11-23 MED ORDER — METOPROLOL SUCCINATE ER 50 MG PO TB24
50.0000 mg | ORAL_TABLET | Freq: Two times a day (BID) | ORAL | Status: DC
  Administered 2022-11-23 – 2022-11-26 (×7): 50 mg via ORAL
  Filled 2022-11-23 (×7): qty 1

## 2022-11-23 MED ORDER — MOMETASONE FURO-FORMOTEROL FUM 100-5 MCG/ACT IN AERO
2.0000 | INHALATION_SPRAY | Freq: Two times a day (BID) | RESPIRATORY_TRACT | Status: DC
  Administered 2022-11-23 – 2022-11-25 (×6): 2 via RESPIRATORY_TRACT
  Filled 2022-11-23: qty 8.8

## 2022-11-23 MED ORDER — SODIUM CHLORIDE 0.9% FLUSH: 3.0000 mL | Freq: Two times a day (BID) | INTRAVENOUS | Status: DC

## 2022-11-23 MED ORDER — SODIUM CHLORIDE 0.9 % IV SOLN
2.0000 g | INTRAVENOUS | Status: DC
  Administered 2022-11-24 – 2022-11-25 (×2): 2 g via INTRAVENOUS
  Filled 2022-11-23 (×3): qty 20

## 2022-11-23 MED ORDER — SODIUM CHLORIDE 0.9 % IV SOLN
1.0000 g | INTRAVENOUS | Status: AC
  Administered 2022-11-23: 1 g via INTRAVENOUS
  Filled 2022-11-23: qty 10

## 2022-11-23 MED ORDER — GUAIFENESIN ER 600 MG PO TB12
600.0000 mg | ORAL_TABLET | Freq: Two times a day (BID) | ORAL | Status: DC
  Administered 2022-11-23 – 2022-11-26 (×5): 600 mg via ORAL
  Filled 2022-11-23 (×6): qty 1

## 2022-11-23 MED ORDER — VITAMIN C 500 MG PO TABS
500.0000 mg | ORAL_TABLET | Freq: Two times a day (BID) | ORAL | Status: DC
  Administered 2022-11-23 – 2022-11-26 (×7): 500 mg via ORAL
  Filled 2022-11-23 (×7): qty 1

## 2022-11-23 NOTE — ED Triage Notes (Signed)
Pt arrives from [place] with c/o high wbc and low hgb. Hr 104, sbp 110 en route. Given 250ns by staff at facility PTA

## 2022-11-23 NOTE — ED Notes (Signed)
Gave pt orange juice and a hot pack for her hand because she said "it hurts" but it is not well known where, or why she is not able to explain.

## 2022-11-23 NOTE — ED Provider Notes (Signed)
[place] EMERGENCY DEPARTMENT AT [facility] Provider Note   CSN: [id number] Arrival date & time: 11/23/22  4098     History  No chief complaint on file.   Mariah [name] is a 72 y.o. female.  HPI  Pt is a 72 year old woman w a PMHx of asthma, chronic venus statis dermatitis, thyroid nodule, obesity, anemia, CKD, encephalopathy, seizure disorder, vitamin D deficiency, DM2, migraines, UTI, pneumonia  She is present emergency room today with complaints of AMS (AO x3 at baseline).  According to RN staff she's been coughing more overnight.  D overnight RN states that she has not seen the patient in the past 5 days since she has been off work however she states that today when she arrived at work she noticed that the patient was more fatigued and disoriented than usual.  She also reviewed her lab work that had been done and saw that there was a new WBC elevation at 14 but not been present previously.  Patient was transported by EMS emergency room and found to have a fever of 100.9.   Jasmine (daytime RN) will be available after 7:30 am for via phone      Home Medications Prior to Admission medications   Medication Sig Start Date End Date Taking? Authorizing Provider  acetaminophen (TYLENOL) 650 MG CR tablet Take 650 mg by mouth every 6 (six) hours as needed for pain.    [provider]  ADVAIR DISKUS 100-50 MCG/DOSE AEPB Inhale 1 puff into the lungs 2 (two) times daily. 06/26/18   Arnetha Courser, MD  albuterol (PROVENTIL HFA;VENTOLIN HFA) 108 (90 Base) MCG/ACT inhaler Inhale 2 puffs into the lungs every 6 (six) hours as needed for wheezing or shortness of breath. 06/26/18   Arnetha Courser, MD  barrier cream (NON-SPECIFIED) CREA Apply 1 application topically 2 (two) times daily as needed.    [provider]  bisacodyl (DULCOLAX) 10 MG suppository Constipation (2 of 4): If not relieved by MOM, give 10 mg Bisacodyl suppositiory rectally X 1 dose in 24 hours as  needed (Do not use constipation standing orders for residents with renal failure/CFR less than 30. Contact MD for orders)    [provider]  cephALEXin (KEFLEX) 500 MG capsule Take 1 capsule (500 mg total) by mouth 4 (four) times daily. 07/28/22   Mesner, Barbara Cower, MD  cetaphil (CETAPHIL) cream Apply 1 application topically at bedtime. Apply to bilateral lower legs for dry skin.    [provider]  Cholecalciferol (VITAMIN D3) 125 MCG (5000 UT) CAPS Take 1 capsule by mouth daily.     [provider]  esomeprazole (NEXIUM) 20 MG capsule Take 1 capsule (20 mg total) by mouth every morning. For acid reflex 06/27/18   Arnetha Courser, MD  ferrous sulfate 325 (65 FE) MG tablet Take 325 mg by mouth in the morning and at bedtime.    [provider]  furosemide (LASIX) 40 MG tablet TAKE ONE TABLET BY MOUTH EVERY DAY 02/27/18   Tyson Alias, MD  hydrALAZINE (APRESOLINE) 25 MG tablet Take 25 mg by mouth 3 (three) times daily.     [provider]  levETIRAcetam (KEPPRA) 500 MG tablet Take 1 tablet (500 mg total) by mouth 2 (two) times daily. 05/26/18   Arnetha Courser, MD  loperamide (IMODIUM A-D) 2 MG tablet Take 2 mg by mouth as needed for diarrhea or loose stools. Give after each stool prn, not to exceed 8 mg in 24 hours  [provider]  losartan (COZAAR) 100 MG tablet Take 100 mg by mouth daily.    [provider]  magnesium hydroxide (MILK OF MAGNESIA) 400 MG/5ML suspension Constipation (1 of 4): If no BM in 3 days, give 30 cc Milk of Magnesium p.o. x 1 dose in 24 hours as needed (Do not use standing constipation orders for residents with renal failure CFR less than 30. Contact MD for orders)    [provider]  metFORMIN (GLUCOPHAGE) 500 MG tablet Take 500 mg by mouth 2 (two) times daily with a meal.    [provider]  metoprolol succinate (TOPROL-XL) 50 MG 24 hr tablet Take 50 mg by mouth 2 (two) times daily.     [provider]  mometasone (NASONEX) 50 MCG/ACT nasal spray Place 1 spray into the nose as needed. 12/12/17   Arnetha Courser, MD  NON FORMULARY DIET: REGULAR, THIN LIQUIDS    [provider]  promethazine (PHENERGAN) 25 MG tablet Take 25 mg by mouth every 6 (six) hours as needed for nausea or vomiting.    [provider]  senna-docusate (SENOKOT-S) 8.6-50 MG tablet Take 1 tablet by mouth 2 (two) times daily.    [provider]  Sodium Phosphates (RA SALINE ENEMA RE) Place rectally as needed.    [provider]  vitamin C (ASCORBIC ACID) 500 MG tablet Take 500 mg by mouth 2 (two) times daily.    [provider]      Allergies    Penicillins, Naproxen, Codeine, and Indomethacin    Review of Systems   Review of Systems  Physical Exam Updated Vital Signs BP 136/78   Pulse (!) 107   Temp (!) 100.9 F (38.3 C) (Rectal)   Resp 20   LMP 12/19/2017   SpO2 99%  Physical Exam Vitals and nursing note reviewed.  Constitutional:      General: She is not in acute distress. HENT:     Head: Normocephalic and atraumatic.     Nose: Nose normal.     Mouth/Throat:     Mouth: Mucous membranes are moist.  Eyes:     General: No scleral icterus. Cardiovascular:     Rate and Rhythm: Normal rate and regular rhythm.     Pulses: Normal pulses.     Heart sounds: Normal heart sounds.  Pulmonary:     Effort: Pulmonary effort is normal. No respiratory distress.     Breath sounds: Rales present. No wheezing.     Comments: Faint crackles in left base, no wheezing Abdominal:     Palpations: Abdomen is soft.     Tenderness: There is abdominal tenderness. There is no guarding or rebound.     Comments: Mild suprapubic and left lower quadrant abdominal tenderness no guarding or rebound.  Musculoskeletal:     Cervical back: Normal range of motion.     Right lower leg: No edema.     Left lower leg: No edema.     Comments: Guarding L wrist  Skin:    General: Skin  is warm and dry.     Capillary Refill: Capillary refill takes less than 2 seconds.  Neurological:     Mental Status: She is alert. Mental status is at baseline.     Comments: Oriented to self.   Psychiatric:        Mood and Affect: Mood normal.        Behavior: Behavior normal.     ED Results / Procedures /  Treatments   Labs (all labs ordered are listed, but only abnormal results are displayed) Labs Reviewed  COMPREHENSIVE METABOLIC PANEL - Abnormal; Notable for the following components:      Result Value   CO2 21 (*)    BUN 36 (*)    Creatinine, Ser 2.68 (*)    Calcium 8.6 (*)    Albumin 1.9 (*)    GFR, Estimated 19 (*)    All other components within normal limits  CBC WITH DIFFERENTIAL/PLATELET - Abnormal; Notable for the following components:   WBC 14.0 (*)    RBC 3.01 (*)    Hemoglobin 8.2 (*)    HCT 27.7 (*)    MCHC 29.6 (*)    RDW 15.6 (*)    Platelets 581 (*)    Neutro Abs 11.7 (*)    Abs Immature Granulocytes 0.09 (*)    All other components within normal limits  PROTIME-INR - Abnormal; Notable for the following components:   Prothrombin Time 19.5 (*)    INR 1.7 (*)    All other components within normal limits  RESP PANEL BY RT-PCR (RSV, FLU A&B, COVID)  RVPGX2  CULTURE, BLOOD (ROUTINE X 2)  CULTURE, BLOOD (ROUTINE X 2)  LACTIC ACID, PLASMA  URINALYSIS, ROUTINE W REFLEX MICROSCOPIC    EKG EKG Interpretation  Date/Time:  Wednesday Nov 23 2022 05:22:15 EDT Ventricular Rate:  99 PR Interval:  137 QRS Duration: 75 QT Interval:  335 QTC Calculation: 430 R Axis:   22 Text Interpretation: Sinus rhythm Low voltage, precordial leads RSR' in V1 or V2, probably normal variant Borderline T abnormalities, diffuse leads Artifact in lead(s) V2 Confirmed by Zadie Rhine ([id number]) on 11/23/2022 5:37:44 AM  Radiology CT CHEST ABDOMEN PELVIS WO CONTRAST  Result Date: 11/23/2022 CLINICAL DATA:  Abnormal x-ray. Lung opacities and coughing. Concern for pneumonia.  Leukocytosis. EXAM: CT CHEST, ABDOMEN AND PELVIS WITHOUT CONTRAST TECHNIQUE: Multidetector CT imaging of the chest, abdomen and pelvis was performed following the standard protocol without IV contrast. RADIATION DOSE REDUCTION: This exam was performed according to the departmental dose-optimization program which includes automated exposure control, adjustment of the mA and/or kV according to patient size and/or use of iterative reconstruction technique. COMPARISON:  PA and lateral chest today and 07/27/2022, and CTA chest, abdomen and pelvis 02/06/2016 FINDINGS: CT CHEST FINDINGS Cardiovascular: The cardiac size is normal. There is a small chronic pericardial effusion to the right anteriorly. There are no visible coronary calcifications. There is aortic atherosclerosis without aneurysm with normal great vessel branching and normal caliber pulmonary veins and arteries. Mediastinum/Nodes: There is heterogeneous 4.2 cm mass of the thyroid isthmus and left lobe which has previously been recommended for biopsy as of the last file ultrasound from 09/26/2017. There is no record of biopsy having been performed. If this was not performed elsewhere then biopsy should be considered at this time. Small hiatal hernia. There is mild thickening of the distal thoracic esophagus probably due to esophagitis and most likely reflux related. Shotty subcentimeter short axis prevascular and right paratracheal lymph nodes are unchanged. No intrathoracic or axillary adenopathy is seen. Lungs/Pleura: There are small symmetric layering pleural effusions new since the prior study. There are mild posterior atelectatic changes. There is diffuse bronchial thickening without bronchiectasis or bronchial plugging. Subpleural septal prominence is seen in the lung bases and could be due to interstitial edema or interstitial pneumonitis. The lungs are otherwise clear. Musculoskeletal: There is osteopenia, degenerative changes of the spine and slight  thoracic dextroscoliosis. The  ribcage is intact. CT ABDOMEN PELVIS FINDINGS Hepatobiliary: There is homogeneous noncontrast attenuation of the liver. No mass is seen without contrast. Multiple stones interval developed in the gallbladder but no wall thickening or biliary dilatation. Pancreas: Unremarkable without contrast. Spleen: Unremarkable without contrast. Adrenals/Urinary Tract: There is no adrenal mass. No focal abnormality in the unenhanced renal cortex. There are occasional punctate nonobstructive caliceal stones in the left kidney but no nephrolithiasis seen on the right. There are no obstructing stones or hydroureteronephrosis on either side. There is no bladder thickening. The superior bladder is indented by the enlarged lobular fibroid uterus. Stomach/Bowel: Small hiatal hernia. Otherwise unremarkable stomach and small bowel. The appendix is normal. Small bowel again noted extending out lateral to the ascending colon consistent with a nonobstructive transmesenteric internal hernia. The ascending and proximal transverse colon are unremarkable apart from retained stool. There is mild wall thickening and stranding consistent with colitis in the distal transverse colon sparing the splenic flexure. Beginning in the mid third of the descending colon and continuing through most of the sigmoid segment, there is wall thickening and and more prominent inflammatory stranding consistent with additional colitis. No pneumatosis is seen. There are diverticula over portions of the affected colon, but the length of involvement would be atypical for diverticulitis. Infectious or inflammatory colitis are more likely. Ischemic etiology is less likely given the distribution. The rectal wall is unremarkable. Vascular/Lymphatic: Aortic atherosclerosis. No enlarged abdominal or pelvic lymph nodes. Borderline prominent external iliac chain and bilateral inguinal borderline sized lymph nodes are unchanged in the interval since  2017. No other adenopathy is seen. There is mild aortoiliac calcification. No significant visceral branch vessel calcific plaques including the SMA and IMA, but no arterial patency is not established with a noncontrast exam. Reproductive: Enlarged fibroid uterus is again noted with lobulation, multiple calcified fibroids and superior mass effect on the bladder. The ovaries do not appear enlarged. Other: There is mild body wall edema in the flanks and thighs. There is no bowel pneumatosis. There is trace reactive fluid in left pericolic gutter but no other free fluid. There is no free air, abscess or free hemorrhage. There are no incarcerated hernias. Small umbilical fat hernia. Musculoskeletal: There is ankylosis across the SI joints anteriorly with bridging osteophytes. There is pelvic enthesopathy. Osteopenia and degenerative change lumbar spine. There is advanced L4-5 facet hypertrophy with grade 1 degenerative anterolisthesis. IMPRESSION: 1. Small layering pleural effusions with basilar subpleural septal thickening which could be due to interstitial edema or interstitial pneumonitis. The cardiac size is normal. 2. Diffuse bronchial thickening without bronchiectasis or bronchial plugging. 3. Aortic atherosclerosis. 4. Small hiatal hernia with probable reflux esophagitis in the distal thoracic esophagus. 5. There is no record of thyroid biopsy having been performed. If this was not performed elsewhere then biopsy should be considered at this time for the 4.2 cm thyroid mass noted today and previously. 6. Distal transverse colitis, with separate mid/distal descending and sigmoid colitis, most likely infectious or inflammatory. Ischemic etiology is less likely given the distribution. There are diverticula over portions of the affected colon, but the length of involvement would be atypical for diverticulitis. 7. Nonobstructive left micronephrolithiasis. 8. Cholelithiasis. 9. Nonobstructive transmesenteric internal  hernia, unchanged. 10. Enlarged fibroid uterus. 11. Body wall edema in the flanks and thighs. Aortic Atherosclerosis (ICD10-I70.0). Electronically Signed   By: Almira Bar M.D.   On: 11/23/2022 06:15   DG Hand Complete Left  Result Date: 11/23/2022 CLINICAL DATA:  Dementia pain EXAM: LEFT HAND -  COMPLETE 3+ VIEW COMPARISON:  None Available. FINDINGS: No fracture or malalignment. Chronic appearing deformity of distal scaphoid. Mild degenerative arthritis at the first [facility] joint, second MCP joint and at the distal IP joint. IMPRESSION: No acute osseous abnormality Electronically Signed   By: Jasmine Pang M.D.   On: 11/23/2022 03:32   DG Wrist Complete Left  Result Date: 11/23/2022 CLINICAL DATA:  Wrist pain EXAM: LEFT WRIST - COMPLETE 3+ VIEW COMPARISON:  None Available. FINDINGS: No fracture or malalignment. Degenerative changes at the first [facility] joint. Possible chronic deformity of the scaphoid. Diffuse wrist swelling. IMPRESSION: No acute osseous abnormality. Degenerative changes at the first [facility] joint. Possible chronic deformity of the scaphoid. Electronically Signed   By: Jasmine Pang M.D.   On: 11/23/2022 03:31   DG Chest 2 View  Result Date: 11/23/2022 CLINICAL DATA:  Suspected sepsis EXAM: CHEST - 2 VIEW COMPARISON:  07/27/2022 FINDINGS: Low lung volumes. Cardiomegaly with diffuse interstitial opacity most likely representing pulmonary edema. Possible small pleural effusions. Aortic atherosclerosis. No focal airspace disease or pneumothorax IMPRESSION: Cardiomegaly with diffuse interstitial opacity most likely representing pulmonary edema. Possible small pleural effusions. Electronically Signed   By: Jasmine Pang M.D.   On: 11/23/2022 03:29    Procedures Procedures    Medications Ordered in ED Medications  azithromycin (ZITHROMAX) 500 mg in sodium chloride 0.9 % 250 mL IVPB (500 mg Intravenous New Bag/Given 11/23/22 0615)  acetaminophen (TYLENOL) tablet 1,000 mg (1,000 mg Oral Given 11/23/22  0326)  lactated ringers bolus 1,000 mL (0 mLs Intravenous Stopped 11/23/22 0505)  cefTRIAXone (ROCEPHIN) 1 g in sodium chloride 0.9 % 100 mL IVPB (0 g Intravenous Stopped 11/23/22 0616)    ED Course/ Medical Decision Making/ A&P Clinical Course as of 11/23/22 3244  Wed Nov 23, 2022  0547 EKG with some artifact however NSR with no ST-T wave abnormalities of note. [WF]    Clinical Course User Index [WF] Gailen Shelter, PA                             Medical Decision Making Amount and/or Complexity of Data Reviewed Labs: ordered. Radiology: ordered. ECG/medicine tests: ordered.  Risk OTC drugs. Decision regarding hospitalization.    This patient presents to the ED for concern of fever, AMS, this involves a number of treatment options, and is a complaint that carries with it a high risk of complications and morbidity. A differential diagnosis was considered for the patient's symptoms which is discussed below:   Patient does meet sepsis criteria with tachycardia, fever, leukocytosis.  I suspect most likely pneumonia as source given that patient has been coughing during my examination and according to facility she has been coughing more overnight.  She denies any chest pain and there is been no hemoptysis have a low suspicion for PE.  She is also not hypoxic.  COVID influenza RSV negative urinalysis with no signs of UTI.   Co morbidities: Discussed in HPI   Brief History:  Pt is a 73 year old woman w a PMHx of asthma, chronic venus statis dermatitis, thyroid nodule, obesity, anemia, CKD, encephalopathy, seizure disorder, vitamin D deficiency, DM2, migraines, UTI, pneumonia  She is present emergency room today with complaints of AMS (AO x3 at baseline).  According to RN staff she's been coughing more overnight.  D overnight RN states that she has not seen the patient in the past 5 days since she has been off work  however she states that today when she arrived at work she noticed  that the patient was more fatigued and disoriented than usual.  She also reviewed her lab work that had been done and saw that there was a new WBC elevation at 14 but not been present previously.  Patient was transported by EMS emergency room and found to have a fever of 100.9.   Jasmine (daytime RN) will be available after 7:30 am for via phone    EMR reviewed including pt PMHx, past surgical history and past visits to ER.   See HPI for more details   Lab Tests:   I ordered and independently interpreted labs. Labs notable for Leukocytosis of 14.0, anemia of 8.2 not significantly lower than it has been previously.  Stool normal color brown during diaper change, CMP notable for new AKI with creatinine elevated 2.7 with baseline of 1.95/1.5  Imaging Studies:  Abnormal findings. I personally reviewed all imaging studies. Imaging notable for Hand and wrist x-ray without bony abnormalities.  CT chest abdomen pelvis without contrast obtained and shows consolidation of the left lower lobe of lung concerning for pneumonia is also diffuse bronchial thickening    IMPRESSION:  1. Small layering pleural effusions with basilar subpleural septal  thickening which could be due to interstitial edema or interstitial  pneumonitis. The cardiac size is normal.  2. Diffuse bronchial thickening without bronchiectasis or bronchial  plugging.  3. Aortic atherosclerosis.  4. Small hiatal hernia with probable reflux esophagitis in the  distal thoracic esophagus.  5. There is no record of thyroid biopsy having been performed. If  this was not performed elsewhere then biopsy should be considered at  this time for the 4.2 cm thyroid mass noted today and previously.  6. Distal transverse colitis, with separate mid/distal descending  and sigmoid colitis, most likely infectious or inflammatory.  Ischemic etiology is less likely given the distribution. There are  diverticula over portions of the affected  colon, but the length of  involvement would be atypical for diverticulitis.  7. Nonobstructive left micronephrolithiasis.  8. Cholelithiasis.  9. Nonobstructive transmesenteric internal hernia, unchanged.  10. Enlarged fibroid uterus.  11. Body wall edema in the flanks and thighs.    Cardiac Monitoring:  The patient was maintained on a cardiac monitor.  I personally viewed and interpreted the cardiac monitored which showed an underlying rhythm of: NSR EKG non-ischemic   Medicines ordered:  I ordered medication including azithro/rocephin  for pna. Tylenol, LR 1L Reevaluation of the patient after these medicines showed that the patient improved I have reviewed the patients home medicines and have made adjustments as needed   Critical Interventions:     Consults/Attending Physician   I discussed this case with my attending physician who cosigned this note including patient's presenting symptoms, physical exam, and planned diagnostics and interventions. Attending physician stated agreement with plan or made changes to plan which were implemented.   Attending physician assessed patient at bedside.    Reevaluation:  After the interventions noted above I re-evaluated patient and found that they have :improved some improvement in mental status.    Social Determinants of Health:     Problem List / ED Course:  Altered mental status in the setting of fever most likely due to pneumonia. AKI receiving gentle hydration. Fever - see above Thyroid mass   Dispostion:  After consideration of the diagnostic results and the patients response to treatment, I feel that the patent would benefit from admission for  altered mental status in the setting of fever most likely due to pneumonia.    Final Clinical Impression(s) / ED Diagnoses Final diagnoses:  Pneumonia of left lower lobe due to infectious organism  Altered mental status, unspecified altered mental status type  AKI  (acute kidney injury) [facility])  Thyroid mass    Rx / DC Orders ED Discharge Orders     None         Gailen Shelter, [place] 11/23/22 8119    Zadie Rhine, MD 11/23/22 0725

## 2022-11-23 NOTE — ED Notes (Signed)
ED TO INPATIENT HANDOFF REPORT  ED Nurse Name and Phone #: Delice Bison, RN  S Name/Age/Gender Mariah [name] 72 y.o. female Room/Bed: 046C/046C  Code Status   Code Status: Full Code  Home/SNF/Other Nursing Home Patient oriented to: self Is this baseline? Yes   Triage Complete: Triage complete  Chief Complaint Pneumonia [J18.9]  Triage Note Pt arrives from [facility] with c/o high wbc and low hgb. Hr 104, sbp 110 en route. Given 250ns by staff at facility PTA    Allergies Allergies  Allergen Reactions   Penicillins Hives and Other (See Comments)    ++ got keflex in 2013 and 2019++  Has patient had a PCN reaction causing immediate rash, facial/tongue/throat swelling, SOB or lightheadedness with hypotension: No  Has patient had a PCN reaction causing severe rash involving mucus membranes or skin necrosis: No  PATIENT HAS HAD A PCN REACTION THAT REQUIRED HOSPITALIZATION:  #  #  YES  #  #   Has patient had a PCN reaction occurring within the last 10 years: No   Naproxen     History of GI intolerance with indomethacin.  She is on PPI for GERD.  NSAIDs should not be used long-term or as maintenance medication due to the comorbidities and age as per [place]   Codeine Nausea And Vomiting and Other (See Comments)   Indomethacin Diarrhea    Level of Care/Admitting Diagnosis ED Disposition     ED Disposition  Admit   Condition  --   Comment  Hospital Area: [facility] [[id number]]  Level of Care: Med-Surg [16]  May admit patient to [facility] or [facility] if equivalent level of care is available:: No  Covid Evaluation: Confirmed COVID Negative  Diagnosis: Pneumonia [227785]  Admitting Physician: Joycelyn Das [[id number]]  Attending Physician: Joycelyn Das [phone]  Certification:: I certify this patient will need inpatient services for at least 2 midnights  Estimated Length of Stay: 2          B Medical/Surgery History Past Medical History:   Diagnosis Date   Allergic rhinitis    Arthritis    "ankles, feet, knees" (10/30/2017)   Asthma    Bursitis    Carpal tunnel syndrome    GERD (gastroesophageal reflux disease)    HTN (hypertension)    Migraine    "a couple/yr" (10/30/2017)   Obesity    Peripheral vascular disease (HCC)    Pneumonia 1960s X 1   Sciatica    Tendinitis    Type II diabetes mellitus (HCC)    Type II   UTI (urinary tract infection)    Past Surgical History:  Procedure Laterality Date   CRANIOTOMY N/A 01/19/2018   Procedure: ENDOSCOPIC TRANSPHENOIDAL RESECTION OF PITUITARY TUMOR;  Surgeon: Lisbeth Renshaw, MD;  Location: [facility] OR;  Service: Neurosurgery;  Laterality: N/A;  ENDOSCOPIC TRANSPHENOIDAL RESECTION OF PITUITARY TUMOR   GANGLION CYST EXCISION Left    KNEE ARTHROSCOPY Left 2003   PLANTAR FASCIA RELEASE Right 1995   "heel"   SINUS ENDO WITH FUSION N/A 01/19/2018   Procedure: SINUS ENDO WITH FUSION;  Surgeon: Osborn Coho, MD;  Location: [facility] OR;  Service: ENT;  Laterality: N/A;   TONSILLECTOMY  1958   TRANSPHENOIDAL APPROACH EXPOSURE N/A 01/19/2018   Procedure: TRANSPHENOIDAL  RESECTION OF PITUITARY TUMOR;  Surgeon: Osborn Coho, MD;  Location: [facility] OR;  Service: ENT;  Laterality: N/A;   UTERINE FIBROID SURGERY  1988; 1997   Dr Randell Patient     A IV  Location/Drains/Wounds Patient Lines/Drains/Airways Status     Active Line/Drains/Airways     Name Placement date Placement time Site Days   Peripheral IV 11/23/22 18 G Right Antecubital 11/23/22  0306  Antecubital  less than 1   External Urinary Catheter 06/03/18  1200  --  1634            Intake/Output Last 24 hours No intake or output data in the 24 hours ending 11/23/22 1006  Labs/Imaging Results for orders placed or performed during the hospital encounter of 11/23/22 (from the past 48 hour(s))  Comprehensive metabolic panel     Status: Abnormal   Collection Time: 11/23/22  2:52 AM  Result Value Ref Range   Sodium 142 135 - 145  mmol/L   Potassium 4.3 3.5 - 5.1 mmol/L   Chloride 111 98 - 111 mmol/L   CO2 21 (L) 22 - 32 mmol/L   Glucose, Bld 79 70 - 99 mg/dL    Comment: Glucose reference range applies only to samples taken after fasting for at least 8 hours.   BUN 36 (H) 8 - 23 mg/dL   Creatinine, Ser 1.61 (H) 0.44 - 1.00 mg/dL   Calcium 8.6 (L) 8.9 - 10.3 mg/dL   Total Protein 7.5 6.5 - 8.1 g/dL   Albumin 1.9 (L) 3.5 - 5.0 g/dL   AST 19 15 - 41 U/L   ALT 12 0 - 44 U/L   Alkaline Phosphatase 74 38 - 126 U/L   Total Bilirubin 0.6 0.3 - 1.2 mg/dL   GFR, Estimated 19 (L) >60 mL/min    Comment: (NOTE) Calculated using the CKD-EPI Creatinine Equation (2021)    Anion gap 10 5 - 15    Comment: Performed at [facility], [address] [place], [place] [address]  Lactic acid, plasma     Status: None   Collection Time: 11/23/22  2:52 AM  Result Value Ref Range   Lactic Acid, Venous 1.2 0.5 - 1.9 mmol/L    Comment: Performed at [facility], [address], [place], [place] [address]  CBC with Differential     Status: Abnormal   Collection Time: 11/23/22  2:52 AM  Result Value Ref Range   WBC 14.0 (H) 4.0 - 10.5 K/uL   RBC 3.01 (L) 3.87 - 5.11 MIL/uL   Hemoglobin 8.2 (L) 12.0 - 15.0 g/dL   HCT 11.9 (L) 14.7 - 82.9 %   MCV 92.0 80.0 - 100.0 fL   MCH 27.2 26.0 - 34.0 pg   MCHC 29.6 (L) 30.0 - 36.0 g/dL   RDW 56.2 (H) 13.0 - 86.5 %   Platelets 581 (H) 150 - 400 K/uL   nRBC 0.0 0.0 - 0.2 %   Neutrophils Relative % 83 %   Neutro Abs 11.7 (H) 1.7 - 7.7 K/uL   Lymphocytes Relative 8 %   Lymphs Abs 1.1 0.7 - 4.0 K/uL   Monocytes Relative 7 %   Monocytes Absolute 1.0 0.1 - 1.0 K/uL   Eosinophils Relative 1 %   Eosinophils Absolute 0.2 0.0 - 0.5 K/uL   Basophils Relative 0 %   Basophils Absolute 0.0 0.0 - 0.1 K/uL   Immature Granulocytes 1 %   Abs Immature Granulocytes 0.09 (H) 0.00 - 0.07 K/uL    Comment: Performed at [facility], [address], [place], [place] [address]  Protime-INR      Status: Abnormal   Collection Time: 11/23/22  2:52 AM  Result Value Ref  Range   Prothrombin Time 19.5 (H) 11.4 - 15.2 seconds   INR 1.7 (H) 0.8 - 1.2    Comment: (NOTE) INR goal varies based on device and disease states. Performed at [facility], [address], [place], [place] [address]   Urinalysis, Routine w reflex microscopic -Urine, Catheterized     Status: None   Collection Time: 11/23/22  3:06 AM  Result Value Ref Range   Color, Urine YELLOW YELLOW   APPearance CLEAR CLEAR   Specific Gravity, Urine 1.012 1.005 - 1.030   pH 5.0 5.0 - 8.0   Glucose, UA NEGATIVE NEGATIVE mg/dL   Hgb urine dipstick NEGATIVE NEGATIVE   Bilirubin Urine NEGATIVE NEGATIVE   Ketones, ur NEGATIVE NEGATIVE mg/dL   Protein, ur NEGATIVE NEGATIVE mg/dL   Nitrite NEGATIVE NEGATIVE   Leukocytes,Ua NEGATIVE NEGATIVE    Comment: Performed at [facility], [address] [place], [place] [address]  Resp panel by RT-PCR (RSV, Flu A&B, Covid) Anterior Nasal Swab     Status: None   Collection Time: 11/23/22  3:27 AM   Specimen: Anterior Nasal Swab  Result Value Ref Range   SARS Coronavirus 2 by RT PCR NEGATIVE NEGATIVE   Influenza A by PCR NEGATIVE NEGATIVE   Influenza B by PCR NEGATIVE NEGATIVE    Comment: (NOTE) The Xpert Xpress SARS-CoV-2/FLU/RSV plus assay is intended as an aid in the diagnosis of influenza from Nasopharyngeal swab specimens and should not be used as a sole basis for treatment. Nasal washings and aspirates are unacceptable for Xpert Xpress SARS-CoV-2/FLU/RSV testing.  Fact Sheet for Patients: BloggerCourse.com  Fact Sheet for Healthcare Providers: SeriousBroker.it  This test is not yet approved or cleared by the [place] FDA and has been authorized for detection and/or diagnosis of SARS-CoV-2 by FDA under an Emergency Use Authorization (EUA). This EUA will remain in effect (meaning this test can be used) for the  duration of the COVID-19 declaration under Section 564(b)(1) of the Act, 21 U.S.C. section 360bbb-3(b)(1), unless the authorization is terminated or revoked.     Resp Syncytial Virus by PCR NEGATIVE NEGATIVE    Comment: (NOTE) Fact Sheet for Patients: BloggerCourse.com  Fact Sheet for Healthcare Providers: SeriousBroker.it  This test is not yet approved or cleared by the [place] FDA and has been authorized for detection and/or diagnosis of SARS-CoV-2 by FDA under an Emergency Use Authorization (EUA). This EUA will remain in effect (meaning this test can be used) for the duration of the COVID-19 declaration under Section 564(b)(1) of the Act, 21 U.S.C. section 360bbb-3(b)(1), unless the authorization is terminated or revoked.  Performed at [facility], [address] [place], [place] [address]    CT CHEST ABDOMEN PELVIS WO CONTRAST  Result Date: 11/23/2022 CLINICAL DATA:  Abnormal x-ray. Lung opacities and coughing. Concern for pneumonia. Leukocytosis. EXAM: CT CHEST, ABDOMEN AND PELVIS WITHOUT CONTRAST TECHNIQUE: Multidetector CT imaging of the chest, abdomen and pelvis was performed following the standard protocol without IV contrast. RADIATION DOSE REDUCTION: This exam was performed according to the departmental dose-optimization program which includes automated exposure control, adjustment of the mA and/or kV according to patient size and/or use of iterative reconstruction technique. COMPARISON:  PA and lateral chest today and 07/27/2022, and CTA chest, abdomen and pelvis 02/06/2016 FINDINGS: CT CHEST FINDINGS Cardiovascular: The cardiac size is normal. There is a small chronic pericardial effusion to the right anteriorly. There are no visible coronary calcifications. There is aortic atherosclerosis without aneurysm  with normal great vessel branching and normal caliber pulmonary veins and arteries. 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 There is no record of biopsy having been performed. If this was not performed elsewhere then biopsy should be considered at this time. Small hiatal hernia. There is mild thickening of the distal thoracic esophagus probably due to esophagitis and most likely reflux related. Shotty subcentimeter short axis prevascular and right paratracheal lymph nodes are unchanged. No intrathoracic or axillary adenopathy is seen. Lungs/Pleura: There are small symmetric layering pleural effusions new since the prior study. There are mild posterior atelectatic changes. There is diffuse bronchial thickening without bronchiectasis or bronchial plugging. Subpleural septal prominence is seen in the lung bases and could be due to interstitial edema or interstitial pneumonitis. The lungs are otherwise clear. Musculoskeletal: There is osteopenia, degenerative changes of the spine and slight thoracic dextroscoliosis. The ribcage is intact. CT ABDOMEN PELVIS FINDINGS Hepatobiliary: There is homogeneous noncontrast attenuation of the liver. No mass is seen without contrast. Multiple stones interval developed in the gallbladder but no wall thickening or biliary dilatation. Pancreas: Unremarkable without contrast. Spleen: Unremarkable without contrast. Adrenals/Urinary Tract: There is no adrenal mass. No focal abnormality in the unenhanced renal cortex. There are occasional punctate nonobstructive caliceal stones in the left kidney but no nephrolithiasis seen on the right. There are no obstructing stones or hydroureteronephrosis on either side. There is no bladder thickening. The superior bladder is indented by the enlarged lobular fibroid uterus. Stomach/Bowel: Small hiatal hernia. Otherwise unremarkable stomach and small bowel. The appendix is normal. Small bowel again noted extending  out lateral to the ascending colon consistent with a nonobstructive transmesenteric internal hernia. The ascending and proximal transverse colon are unremarkable apart from retained stool. There is mild wall thickening and stranding consistent with colitis in the distal transverse colon sparing the splenic flexure. Beginning in the mid third of the descending colon and continuing through most of the sigmoid segment, there is wall thickening and and more prominent inflammatory stranding consistent with additional colitis. No pneumatosis is seen. There are diverticula over portions of the affected colon, but the length of involvement would be atypical for diverticulitis. Infectious or inflammatory colitis are more likely. Ischemic etiology is less likely given the distribution. The rectal wall is unremarkable. Vascular/Lymphatic: Aortic atherosclerosis. No enlarged abdominal or pelvic lymph nodes. Borderline prominent external iliac chain and bilateral inguinal borderline sized lymph nodes are unchanged in the interval since 2017. No other adenopathy is seen. There is mild aortoiliac calcification. No significant visceral branch vessel calcific plaques including the SMA and IMA, but no arterial patency is not established with a noncontrast exam. Reproductive: Enlarged fibroid uterus is again noted with lobulation, multiple calcified fibroids and superior mass effect on the bladder. The ovaries do not appear enlarged. Other: There is mild body wall edema in the flanks and thighs. There is no bowel pneumatosis. There is trace reactive fluid in left pericolic gutter but no other free fluid. There is no free air, abscess or free hemorrhage. There are no incarcerated hernias. Small umbilical fat hernia. Musculoskeletal: There is ankylosis across the SI joints anteriorly with bridging osteophytes. There is pelvic enthesopathy. Osteopenia and degenerative change lumbar spine. There is advanced L4-5 facet hypertrophy with  grade 1 degenerative anterolisthesis. IMPRESSION: 1. Small layering pleural effusions with basilar subpleural septal thickening which could be due to interstitial  edema or interstitial pneumonitis. The cardiac size is normal. 2. Diffuse bronchial thickening without bronchiectasis or bronchial plugging. 3. Aortic atherosclerosis. 4. Small hiatal hernia with probable reflux esophagitis in the distal thoracic esophagus. 5. There is no record of thyroid biopsy having been performed. If this was not performed elsewhere then biopsy should be considered at this time for the 4.2 cm thyroid mass noted today and previously. 6. Distal transverse colitis, with separate mid/distal descending and sigmoid colitis, most likely infectious or inflammatory. Ischemic etiology is less likely given the distribution. There are diverticula over portions of the affected colon, but the length of involvement would be atypical for diverticulitis. 7. Nonobstructive left micronephrolithiasis. 8. Cholelithiasis. 9. Nonobstructive transmesenteric internal hernia, unchanged. 10. Enlarged fibroid uterus. 11. Body wall edema in the flanks and thighs. Aortic Atherosclerosis (ICD10-I70.0). Electronically Signed   By: Almira Bar M.D.   On: 11/23/2022 06:15   DG Hand Complete Left  Result Date: 11/23/2022 CLINICAL DATA:  Dementia pain EXAM: LEFT HAND - COMPLETE 3+ VIEW COMPARISON:  None Available. FINDINGS: No fracture or malalignment. Chronic appearing deformity of distal scaphoid. Mild degenerative arthritis at the first [facility] joint, second MCP joint and at the distal IP joint. IMPRESSION: No acute osseous abnormality Electronically Signed   By: Jasmine Pang M.D.   On: 11/23/2022 03:32   DG Wrist Complete Left  Result Date: 11/23/2022 CLINICAL DATA:  Wrist pain EXAM: LEFT WRIST - COMPLETE 3+ VIEW COMPARISON:  None Available. FINDINGS: No fracture or malalignment. Degenerative changes at the first [facility] joint. Possible chronic deformity of the  scaphoid. Diffuse wrist swelling. IMPRESSION: No acute osseous abnormality. Degenerative changes at the first [facility] joint. Possible chronic deformity of the scaphoid. Electronically Signed   By: Jasmine Pang M.D.   On: 11/23/2022 03:31   DG Chest 2 View  Result Date: 11/23/2022 CLINICAL DATA:  Suspected sepsis EXAM: CHEST - 2 VIEW COMPARISON:  07/27/2022 FINDINGS: Low lung volumes. Cardiomegaly with diffuse interstitial opacity most likely representing pulmonary edema. Possible small pleural effusions. Aortic atherosclerosis. No focal airspace disease or pneumothorax IMPRESSION: Cardiomegaly with diffuse interstitial opacity most likely representing pulmonary edema. Possible small pleural effusions. Electronically Signed   By: Jasmine Pang M.D.   On: 11/23/2022 03:29    Pending Labs Unresulted Labs (From admission, onward)     Start     Ordered   11/24/22 0500  Comprehensive metabolic panel  Tomorrow morning,   R        11/23/22 0845   11/24/22 0500  CBC  Tomorrow morning,   R        11/23/22 0845   11/24/22 0500  Magnesium  Tomorrow morning,   R        11/23/22 0845   11/23/22 0846  HIV Antibody (routine testing w rflx)  (HIV Antibody (Routine testing w reflex) panel)  Once,   R        11/23/22 0845   11/23/22 0846  Legionella Pneumophila Serogp 1 Ur Ag  (COPD / Pneumonia / Cellulitis / Lower Extremity Wound)  Once,   R        11/23/22 0845   11/23/22 0846  Strep pneumoniae urinary antigen  (COPD / Pneumonia / Cellulitis / Lower Extremity Wound)  Once,   R        11/23/22 0845   11/23/22 0846  CBC  (heparin)  Once,   R       Comments: Baseline for heparin therapy IF NOT ALREADY DRAWN.  Notify MD if PLT < 100 K.    11/23/22 0845   11/23/22 0846  Creatinine, serum  (heparin)  Once,   R       Comments: Baseline for heparin therapy IF NOT ALREADY DRAWN.    11/23/22 0845   11/23/22 0238  Culture, blood (Routine x 2)  BLOOD CULTURE X 2,   R (with STAT occurrences)      11/23/22 0237             Vitals/Pain Today's Vitals   11/23/22 0800 11/23/22 0824 11/23/22 0841 11/23/22 0900  BP: (!) 117/58   109/62  Pulse: 97  92 (!) 104  Resp: 19  19 20   Temp:  97.9 F (36.6 C)    TempSrc:  Oral    SpO2: 99%  98% 99%  PainSc: Asleep       Isolation Precautions No active isolations  Medications Medications  hydrALAZINE (APRESOLINE) tablet 25 mg (has no administration in time range)  mometasone-formoterol (DULERA) 100-5 MCG/ACT inhaler 2 puff (has no administration in time range)  guaiFENesin (MUCINEX) 12 hr tablet 600 mg (has no administration in time range)  fluticasone (FLONASE) 50 MCG/ACT nasal spray 1 spray (has no administration in time range)  cefTRIAXone (ROCEPHIN) 2 g in sodium chloride 0.9 % 100 mL IVPB (has no administration in time range)  azithromycin (ZITHROMAX) 500 mg in sodium chloride 0.9 % 250 mL IVPB (has no administration in time range)  heparin injection 5,000 Units (5,000 Units Subcutaneous Given 11/23/22 0923)  sodium chloride flush (NS) 0.9 % injection 3 mL (has no administration in time range)  sodium chloride flush (NS) 0.9 % injection 3 mL (has no administration in time range)  0.9 %  sodium chloride infusion (has no administration in time range)  acetaminophen (TYLENOL) tablet 650 mg (has no administration in time range)    Or  acetaminophen (TYLENOL) suppository 650 mg (has no administration in time range)  polyethylene glycol (MIRALAX / GLYCOLAX) packet 17 g (has no administration in time range)  albuterol (PROVENTIL) (2.5 MG/3ML) 0.083% nebulizer solution 2.5 mg (has no administration in time range)  hydrALAZINE (APRESOLINE) injection 10 mg (has no administration in time range)  acetaminophen (TYLENOL) tablet 1,000 mg (1,000 mg Oral Given 11/23/22 0326)  lactated ringers bolus 1,000 mL (0 mLs Intravenous Stopped 11/23/22 0505)  cefTRIAXone (ROCEPHIN) 1 g in sodium chloride 0.9 % 100 mL IVPB (0 g Intravenous Stopped 11/23/22 0616)  azithromycin  (ZITHROMAX) 500 mg in sodium chloride 0.9 % 250 mL IVPB (0 mg Intravenous Stopped 11/23/22 0720)  cefTRIAXone (ROCEPHIN) 1 g in sodium chloride 0.9 % 100 mL IVPB (1 g Intravenous New Bag/Given 11/23/22 0454)    Mobility non-ambulatory     Focused Assessments Cardiac Assessment Handoff:    Lab Results  Component Value Date   CKTOTAL 257 (H) 07/27/2022   No results found for: "DDIMER" Does the Patient currently have chest pain? No    R Recommendations: See Admitting Provider Note  Report given to:   Additional Notes:

## 2022-11-23 NOTE — H&P (Signed)
[facility] History and Physical  COLLEEN KOTLARZ ZOX:[id number] DOB: 1951/03/20 DOA: 11/23/2022  Referring physician: ED  PCP: Marinda Elk, MD   Patient is coming from: Skilled nursing facility  Chief Complaint: Altered mental status  HPI:  Patient is a 72 years old female with past medical history of asthma, chronic venous stasis, thyroid nodule, obesity, anemia, CKD, dementia, history of falls, encephalopathy, seizure disorder, vitamin D deficiency, DM2, migraines, UTI, pneumonia presented to the hospital from skilled nursing facility with altered mental status with increased cough.  Patient was more fatigued and disoriented than usual.  Patient was then brought into the hospital and was noted to have temperature of 100.9 F.  Patient denied any nausea, vomiting, or abdominal pain.  Denies any urinary urgency frequency or dysuria.  Has been having some cough without sputum production.  Patient does have history of falls and debility and has been at the skilled nursing facility for several years now.  In the ED, COVID influenza and RSV negative.  Labs showed leukocytosis at 14 K, anemia with hemoglobin of 8.2, thrombocytosis, creatinine elevated at 2.60, lactate at 1.2.  Urinalysis was negative.  INR was elevated at 1.7.  CT scan of the chest showed pneumonitis.  Mild colitis reported as well.  Small layering pleural effusions and diffuse bronchial thickening.  Patient received Rocephin and Zithromax, Ringer lactate in the ED and was admitted to the hospital for further evaluation and treatment.  Assessment and plan.  Acute metabolic encephalopathy likely secondary to pneumonia.  On the background of dementia as per the family..  Urinalysis was negative.  Patient febrile with a temperature of 100.9 F.  Improving.  Sepsis secondary to pneumonia.  Patient was febrile, with leukocytosis and altered mental status with underlying pneumonia suggestive of sepsis.  Continue Rocephin and  Zithromax.  Blood cultures have been sent from the ED.  Will follow recommendations.  Patient is febrile with leukocytosis.  Check urinary Legionella and strep antigen.  History of asthma, Continue bronchodilators.  Closely monitor.  History of hypertension.  Hold losartan for now.  Continue hydralazine.  Mild AKI on chronic kidney disease, Closely monitor creatinine levels.  Creatinine today at 2.6.  Last creatinine of 1.9, 3 months back.  Received IV fluids in the ED.  Will continue to monitor.  Will hold losartan and Lasix for now.  Continue normal saline 75 mill per hour for 1 day and reassess in AM.  History of seizure disorder, On Keppra as outpatient.  Will resume  Diabetes mellitus type 2, Last hemoglobin A1c of 5.9, 1-year back.  Patient is on metformin as outpatient.  Continue sliding scale insulin while in the hospital.  History of fall and debility. Patient has been at the skilled nursing facility for the last 5 years   DVT Prophylaxis: Heparin subcu  Review of Systems:  All systems were reviewed and were negative unless otherwise mentioned in the HPI   Past Medical History:  Diagnosis Date   Allergic rhinitis    Arthritis    "ankles, feet, knees" (10/30/2017)   Asthma    Bursitis    Carpal tunnel syndrome    GERD (gastroesophageal reflux disease)    HTN (hypertension)    Migraine    "a couple/yr" (10/30/2017)   Obesity    Peripheral vascular disease (HCC)    Pneumonia 1960s X 1   Sciatica    Tendinitis    Type II diabetes mellitus (HCC)    Type II   UTI (  urinary tract infection)    Past Surgical History:  Procedure Laterality Date   CRANIOTOMY N/A 01/19/2018   Procedure: ENDOSCOPIC TRANSPHENOIDAL RESECTION OF PITUITARY TUMOR;  Surgeon: Lisbeth Renshaw, MD;  Location: [facility] OR;  Service: Neurosurgery;  Laterality: N/A;  ENDOSCOPIC TRANSPHENOIDAL RESECTION OF PITUITARY TUMOR   GANGLION CYST EXCISION Left    KNEE ARTHROSCOPY Left 2003   PLANTAR FASCIA RELEASE  Right 1995   "heel"   SINUS ENDO WITH FUSION N/A 01/19/2018   Procedure: SINUS ENDO WITH FUSION;  Surgeon: Osborn Coho, MD;  Location: [facility] OR;  Service: ENT;  Laterality: N/A;   TONSILLECTOMY  1958   TRANSPHENOIDAL APPROACH EXPOSURE N/A 01/19/2018   Procedure: TRANSPHENOIDAL  RESECTION OF PITUITARY TUMOR;  Surgeon: Osborn Coho, MD;  Location: [facility] OR;  Service: ENT;  Laterality: N/A;   UTERINE FIBROID SURGERY  1988; 1997   Dr Randell Patient    Social History:  reports that she has never smoked. She has never used smokeless tobacco. She reports that she does not drink alcohol and does not use drugs.  Allergies  Allergen Reactions   Penicillins Hives and Other (See Comments)    ++ got keflex in 2013 and 2019++  Has patient had a PCN reaction causing immediate rash, facial/tongue/throat swelling, SOB or lightheadedness with hypotension: No  Has patient had a PCN reaction causing severe rash involving mucus membranes or skin necrosis: No  PATIENT HAS HAD A PCN REACTION THAT REQUIRED HOSPITALIZATION:  #  #  YES  #  #   Has patient had a PCN reaction occurring within the last 10 years: No   Naproxen     History of GI intolerance with indomethacin.  She is on PPI for GERD.  NSAIDs should not be used long-term or as maintenance medication due to the comorbidities and age as per [place]   Codeine Nausea And Vomiting and Other (See Comments)   Indomethacin Diarrhea    Family History  Problem Relation Age of Onset   Uterine cancer Mother    Hypertension Mother    Heart disease Mother    Hypertension Father    Alzheimer's disease Father    Cancer Other      Prior to Admission medications   Medication Sig Start Date End Date Taking? Authorizing Provider  acetaminophen (TYLENOL) 650 MG CR tablet Take 650 mg by mouth every 6 (six) hours as needed for pain.   Yes [provider]  ADVAIR DISKUS 100-50 MCG/DOSE AEPB Inhale 1 puff into the lungs 2 (two) times daily. 06/26/18  Yes  Arnetha Courser, MD  albuterol (PROVENTIL HFA;VENTOLIN HFA) 108 (90 Base) MCG/ACT inhaler Inhale 2 puffs into the lungs every 6 (six) hours as needed for wheezing or shortness of breath. 06/26/18  Yes Arnetha Courser, MD  barrier cream (NON-SPECIFIED) CREA Apply 1 application topically 2 (two) times daily as needed.   Yes [provider]  bisacodyl (DULCOLAX) 10 MG suppository Constipation (2 of 4): If not relieved by MOM, give 10 mg Bisacodyl suppositiory rectally X 1 dose in 24 hours as needed (Do not use constipation standing orders for residents with renal failure/CFR less than 30. Contact MD for orders)   Yes [provider]  cetaphil (CETAPHIL) cream Apply 1 application topically at bedtime. Apply to bilateral lower legs for dry skin.   Yes [provider]  Cholecalciferol (VITAMIN D3) 125 MCG (5000 UT) CAPS Take 1 capsule by mouth daily.    Yes [provider]  ferrous sulfate 325 (  65 FE) MG tablet Take 325 mg by mouth in the morning and at bedtime.   Yes [provider]  furosemide (LASIX) 40 MG tablet TAKE ONE TABLET BY MOUTH EVERY DAY Patient taking differently: Take 40 mg by mouth daily. 02/27/18  Yes Tyson Alias, MD  guaiFENesin (MUCINEX) 600 MG 12 hr tablet Take 600 mg by mouth 2 (two) times daily.   Yes [provider]  hydrALAZINE (APRESOLINE) 25 MG tablet Take 25 mg by mouth 3 (three) times daily.    Yes [provider]  levETIRAcetam (KEPPRA) 500 MG tablet Take 1 tablet (500 mg total) by mouth 2 (two) times daily. 05/26/18  Yes Arnetha Courser, MD  loperamide (IMODIUM A-D) 2 MG tablet Take 2 mg by mouth as needed for diarrhea or loose stools. Give after each stool prn, not to exceed 8 mg in 24 hours   Yes [provider]  losartan (COZAAR) 100 MG tablet Take 100 mg by mouth daily.   Yes [provider]  magnesium hydroxide (MILK OF MAGNESIA) 400 MG/5ML suspension Constipation (1 of 4): If no BM in 3 days,  give 30 cc Milk of Magnesium p.o. x 1 dose in 24 hours as needed (Do not use standing constipation orders for residents with renal failure CFR less than 30. Contact MD for orders)   Yes [provider]  metoprolol succinate (TOPROL-XL) 50 MG 24 hr tablet Take 50 mg by mouth 2 (two) times daily.    Yes [provider]  mometasone (NASONEX) 50 MCG/ACT nasal spray Place 1 spray into the nose as needed. Patient taking differently: Place 1 spray into the nose daily as needed (for allergies). 12/12/17  Yes Arnetha Courser, MD  promethazine (PHENERGAN) 25 MG tablet Take 25 mg by mouth every 6 (six) hours as needed for nausea or vomiting.   Yes [provider]  senna-docusate (SENOKOT-S) 8.6-50 MG tablet Take 1 tablet by mouth 2 (two) times daily as needed for mild constipation.   Yes [provider]  vitamin C (ASCORBIC ACID) 500 MG tablet Take 500 mg by mouth 2 (two) times daily.   Yes [provider]  cephALEXin (KEFLEX) 500 MG capsule Take 1 capsule (500 mg total) by mouth 4 (four) times daily. Patient not taking: Reported on 11/23/2022 07/28/22   Mesner, Barbara Cower, MD  NON FORMULARY DIET: REGULAR, THIN LIQUIDS    [provider]    Physical Exam: Vitals:   11/23/22 0841 11/23/22 0900 11/23/22 1000 11/23/22 1058  BP:  109/62 (!) 104/59 118/70  Pulse: 92 (!) 104 (!) 105 (!) 107  Resp: 19 20 18    Temp:   98 F (36.7 C) 97.9 F (36.6 C)  TempSrc:   Oral Oral  SpO2: 98% 99% 97% 99%   Wt Readings from Last 3 Encounters:  12/21/21 82.4 kg  11/25/21 82.4 kg  11/05/21 83.1 kg   There is no height or weight on file to calculate BMI.  General:  Average built, not in obvious distress, alert awake and mildly Communicative, HENT: Normocephalic, No scleral pallor or icterus noted. Oral mucosa is moist.  Chest:   Diminished breath sounds noted. CVS: S1 &S2 heard. No murmur.  Regular rate and rhythm. Abdomen: Soft, nontender, nondistended.  Bowel sounds are  heard. No abdominal mass palpated Extremities: No cyanosis, clubbing or edema.  Peripheral pulses are palpable. Psych: Alert, awake and oriented place, mildly anxious, CNS:  No cranial nerve deficits.  Power equal in all extremities.  Skin: Warm and dry.  No rashes noted.  Labs on Admission:   CBC: Recent Labs  Lab 11/23/22 0252  WBC 14.0*  NEUTROABS 11.7*  HGB 8.2*  HCT 27.7*  MCV 92.0  PLT 581*    Basic Metabolic Panel: Recent Labs  Lab 11/23/22 0252  NA 142  K 4.3  CL 111  CO2 21*  GLUCOSE 79  BUN 36*  CREATININE 2.68*  CALCIUM 8.6*    Liver Function Tests: Recent Labs  Lab 11/23/22 0252  AST 19  ALT 12  ALKPHOS 74  BILITOT 0.6  PROT 7.5  ALBUMIN 1.9*   No results for input(s): "LIPASE", "AMYLASE" in the last 168 hours. No results for input(s): "AMMONIA" in the last 168 hours.  Cardiac Enzymes: No results for input(s): "CKTOTAL", "CKMB", "CKMBINDEX", "TROPONINI" in the last 168 hours.  BNP (last 3 results) Recent Labs    07/27/22 2050  BNP 90.4    ProBNP (last 3 results) No results for input(s): "PROBNP" in the last 8760 hours.  CBG: No results for input(s): "GLUCAP" in the last 168 hours.  Lipase  No results found for: "LIPASE"   Urinalysis    Component Value Date/Time   COLORURINE YELLOW 11/23/2022 0306   APPEARANCEUR CLEAR 11/23/2022 0306   LABSPEC 1.012 11/23/2022 0306   PHURINE 5.0 11/23/2022 0306   GLUCOSEU NEGATIVE 11/23/2022 0306   HGBUR NEGATIVE 11/23/2022 0306   HGBUR negative 10/02/2008 1542   BILIRUBINUR NEGATIVE 11/23/2022 0306   KETONESUR NEGATIVE 11/23/2022 0306   PROTEINUR NEGATIVE 11/23/2022 0306   UROBILINOGEN 0.2 10/02/2008 1542   NITRITE NEGATIVE 11/23/2022 0306   LEUKOCYTESUR NEGATIVE 11/23/2022 0306     CT CHEST ABDOMEN PELVIS WO CONTRAST  Result Date: 11/23/2022 CLINICAL DATA:  Abnormal x-ray. Lung opacities and coughing. Concern for pneumonia. Leukocytosis. EXAM: CT CHEST, ABDOMEN AND PELVIS WITHOUT  CONTRAST TECHNIQUE: Multidetector CT imaging of the chest, abdomen and pelvis was performed following the standard protocol without IV contrast. RADIATION DOSE REDUCTION: This exam was performed according to the departmental dose-optimization program which includes automated exposure control, adjustment of the mA and/or kV according to patient size and/or use of iterative reconstruction technique. COMPARISON:  PA and lateral chest today and 07/27/2022, and CTA chest, abdomen and pelvis 02/06/2016 FINDINGS: CT CHEST FINDINGS Cardiovascular: The cardiac size is normal. There is a small chronic pericardial effusion to the right anteriorly. There are no visible coronary calcifications. There is aortic atherosclerosis without aneurysm with normal great vessel branching and normal caliber pulmonary veins and arteries. Mediastinum/Nodes: There is heterogeneous 4.2 cm mass of the thyroid isthmus and left lobe which has previously been recommended for biopsy as of the last file ultrasound from 09/26/2017. There is no record of biopsy having been performed. If this was not performed elsewhere then biopsy should be considered at this time. Small hiatal hernia. There is mild thickening of the distal thoracic esophagus probably due to esophagitis and most likely reflux related. Shotty subcentimeter short axis prevascular and right paratracheal lymph nodes are unchanged. No intrathoracic or axillary adenopathy is seen. Lungs/Pleura: There are small symmetric layering pleural effusions new since the prior study. There are mild posterior atelectatic changes. There is diffuse bronchial thickening without bronchiectasis or bronchial plugging. Subpleural septal prominence is seen in the lung bases and could be due to interstitial edema or interstitial pneumonitis. The lungs are otherwise clear. Musculoskeletal: There is osteopenia, degenerative changes of the spine and slight thoracic dextroscoliosis. The ribcage is intact. CT ABDOMEN  PELVIS FINDINGS  Hepatobiliary: There is homogeneous noncontrast attenuation of the liver. No mass is seen without contrast. Multiple stones interval developed in the gallbladder but no wall thickening or biliary dilatation. Pancreas: Unremarkable without contrast. Spleen: Unremarkable without contrast. Adrenals/Urinary Tract: There is no adrenal mass. No focal abnormality in the unenhanced renal cortex. There are occasional punctate nonobstructive caliceal stones in the left kidney but no nephrolithiasis seen on the right. There are no obstructing stones or hydroureteronephrosis on either side. There is no bladder thickening. The superior bladder is indented by the enlarged lobular fibroid uterus. Stomach/Bowel: Small hiatal hernia. Otherwise unremarkable stomach and small bowel. The appendix is normal. Small bowel again noted extending out lateral to the ascending colon consistent with a nonobstructive transmesenteric internal hernia. The ascending and proximal transverse colon are unremarkable apart from retained stool. There is mild wall thickening and stranding consistent with colitis in the distal transverse colon sparing the splenic flexure. Beginning in the mid third of the descending colon and continuing through most of the sigmoid segment, there is wall thickening and and more prominent inflammatory stranding consistent with additional colitis. No pneumatosis is seen. There are diverticula over portions of the affected colon, but the length of involvement would be atypical for diverticulitis. Infectious or inflammatory colitis are more likely. Ischemic etiology is less likely given the distribution. The rectal wall is unremarkable. Vascular/Lymphatic: Aortic atherosclerosis. No enlarged abdominal or pelvic lymph nodes. Borderline prominent external iliac chain and bilateral inguinal borderline sized lymph nodes are unchanged in the interval since 2017. No other adenopathy is seen. There is mild aortoiliac  calcification. No significant visceral branch vessel calcific plaques including the SMA and IMA, but no arterial patency is not established with a noncontrast exam. Reproductive: Enlarged fibroid uterus is again noted with lobulation, multiple calcified fibroids and superior mass effect on the bladder. The ovaries do not appear enlarged. Other: There is mild body wall edema in the flanks and thighs. There is no bowel pneumatosis. There is trace reactive fluid in left pericolic gutter but no other free fluid. There is no free air, abscess or free hemorrhage. There are no incarcerated hernias. Small umbilical fat hernia. Musculoskeletal: There is ankylosis across the SI joints anteriorly with bridging osteophytes. There is pelvic enthesopathy. Osteopenia and degenerative change lumbar spine. There is advanced L4-5 facet hypertrophy with grade 1 degenerative anterolisthesis. IMPRESSION: 1. Small layering pleural effusions with basilar subpleural septal thickening which could be due to interstitial edema or interstitial pneumonitis. The cardiac size is normal. 2. Diffuse bronchial thickening without bronchiectasis or bronchial plugging. 3. Aortic atherosclerosis. 4. Small hiatal hernia with probable reflux esophagitis in the distal thoracic esophagus. 5. There is no record of thyroid biopsy having been performed. If this was not performed elsewhere then biopsy should be considered at this time for the 4.2 cm thyroid mass noted today and previously. 6. Distal transverse colitis, with separate mid/distal descending and sigmoid colitis, most likely infectious or inflammatory. Ischemic etiology is less likely given the distribution. There are diverticula over portions of the affected colon, but the length of involvement would be atypical for diverticulitis. 7. Nonobstructive left micronephrolithiasis. 8. Cholelithiasis. 9. Nonobstructive transmesenteric internal hernia, unchanged. 10. Enlarged fibroid uterus. 11. Body wall  edema in the flanks and thighs. Aortic Atherosclerosis (ICD10-I70.0). Electronically Signed   By: Almira Bar M.D.   On: 11/23/2022 06:15   DG Hand Complete Left  Result Date: 11/23/2022 CLINICAL DATA:  Dementia pain EXAM: LEFT HAND - COMPLETE 3+ VIEW COMPARISON:  None  Available. FINDINGS: No fracture or malalignment. Chronic appearing deformity of distal scaphoid. Mild degenerative arthritis at the first [facility] joint, second MCP joint and at the distal IP joint. IMPRESSION: No acute osseous abnormality Electronically Signed   By: Jasmine Pang M.D.   On: 11/23/2022 03:32   DG Wrist Complete Left  Result Date: 11/23/2022 CLINICAL DATA:  Wrist pain EXAM: LEFT WRIST - COMPLETE 3+ VIEW COMPARISON:  None Available. FINDINGS: No fracture or malalignment. Degenerative changes at the first [facility] joint. Possible chronic deformity of the scaphoid. Diffuse wrist swelling. IMPRESSION: No acute osseous abnormality. Degenerative changes at the first [facility] joint. Possible chronic deformity of the scaphoid. Electronically Signed   By: Jasmine Pang M.D.   On: 11/23/2022 03:31   DG Chest 2 View  Result Date: 11/23/2022 CLINICAL DATA:  Suspected sepsis EXAM: CHEST - 2 VIEW COMPARISON:  07/27/2022 FINDINGS: Low lung volumes. Cardiomegaly with diffuse interstitial opacity most likely representing pulmonary edema. Possible small pleural effusions. Aortic atherosclerosis. No focal airspace disease or pneumothorax IMPRESSION: Cardiomegaly with diffuse interstitial opacity most likely representing pulmonary edema. Possible small pleural effusions. Electronically Signed   By: Jasmine Pang M.D.   On: 11/23/2022 03:29    EKG: Personally reviewed by me which shows normal sinus rhythm   Consultant: None  Code Status: Full code  Microbiology blood culture sent from the ED  Antibiotics: Rocephin and Zithromax  Family Communication:  Patients' condition and plan of care including tests being ordered have been discussed with the  patient and his niece on the phone who indicate understanding and agree with the plan.   Severity of Illness: The appropriate patient status for this patient is INPATIENT. Inpatient status is judged to be reasonable and necessary in order to provide the required intensity of service to ensure the patient's safety. The patient's presenting symptoms, physical exam findings, and initial radiographic and laboratory data in the context of their chronic comorbidities is felt to place them at high risk for further clinical deterioration. Furthermore, it is not anticipated that the patient will be medically stable for discharge from the hospital within 2 midnights of admission.   I certify that at the point of admission it is my clinical judgment that the patient will require inpatient hospital care spanning beyond 2 midnights from the point of admission due to high intensity of service, high risk for further deterioration and high frequency of surveillance required.  Signed, Joycelyn Das, MD [facility] 11/23/2022

## 2022-11-24 DIAGNOSIS — J45909 Unspecified asthma, uncomplicated: Secondary | ICD-10-CM | POA: Diagnosis not present

## 2022-11-24 DIAGNOSIS — I872 Venous insufficiency (chronic) (peripheral): Secondary | ICD-10-CM | POA: Diagnosis not present

## 2022-11-24 DIAGNOSIS — J189 Pneumonia, unspecified organism: Secondary | ICD-10-CM | POA: Diagnosis not present

## 2022-11-24 DIAGNOSIS — G9341 Metabolic encephalopathy: Secondary | ICD-10-CM | POA: Diagnosis not present

## 2022-11-24 LAB — MAGNESIUM: Magnesium: 1.9 mg/dL (ref 1.7–2.4)

## 2022-11-24 LAB — CBC
HCT: 23.6 % — ABNORMAL LOW (ref 36.0–46.0)
Hemoglobin: 7.2 g/dL — ABNORMAL LOW (ref 12.0–15.0)
MCH: 27.1 pg (ref 26.0–34.0)
MCHC: 30.5 g/dL (ref 30.0–36.0)
MCV: 88.7 fL (ref 80.0–100.0)
Platelets: 520 10*3/uL — ABNORMAL HIGH (ref 150–400)
RBC: 2.66 MIL/uL — ABNORMAL LOW (ref 3.87–5.11)
RDW: 15.4 % (ref 11.5–15.5)
WBC: 11.6 10*3/uL — ABNORMAL HIGH (ref 4.0–10.5)
nRBC: 0 % (ref 0.0–0.2)

## 2022-11-24 LAB — CULTURE, BLOOD (ROUTINE X 2)
Culture: NO GROWTH
Special Requests: ADEQUATE

## 2022-11-24 LAB — COMPREHENSIVE METABOLIC PANEL
ALT: 11 U/L (ref 0–44)
AST: 19 U/L (ref 15–41)
Albumin: 1.5 g/dL — ABNORMAL LOW (ref 3.5–5.0)
Alkaline Phosphatase: 62 U/L (ref 38–126)
Anion gap: 9 (ref 5–15)
BUN: 27 mg/dL — ABNORMAL HIGH (ref 8–23)
CO2: 21 mmol/L — ABNORMAL LOW (ref 22–32)
Calcium: 8 mg/dL — ABNORMAL LOW (ref 8.9–10.3)
Chloride: 110 mmol/L (ref 98–111)
Creatinine, Ser: 2.38 mg/dL — ABNORMAL HIGH (ref 0.44–1.00)
GFR, Estimated: 21 mL/min — ABNORMAL LOW (ref >60)
Glucose, Bld: 76 mg/dL (ref 70–99)
Potassium: 3.7 mmol/L (ref 3.5–5.1)
Sodium: 140 mmol/L (ref 135–145)
Total Bilirubin: 0.5 mg/dL (ref 0.3–1.2)
Total Protein: 6.5 g/dL (ref 6.5–8.1)

## 2022-11-24 LAB — C DIFFICILE QUICK SCREEN W PCR REFLEX
C Diff antigen: NEGATIVE
C Diff interpretation: NOT DETECTED
C Diff toxin: NEGATIVE

## 2022-11-24 LAB — GLUCOSE, CAPILLARY
Glucose-Capillary: 76 mg/dL (ref 70–99)
Glucose-Capillary: 92 mg/dL (ref 70–99)
Glucose-Capillary: 132 mg/dL — ABNORMAL HIGH (ref 70–99)
Glucose-Capillary: 122 mg/dL — ABNORMAL HIGH (ref 70–99)

## 2022-11-24 MED ORDER — GLUCERNA SHAKE PO LIQD
237.0000 mL | Freq: Two times a day (BID) | ORAL | Status: DC
  Administered 2022-11-25 – 2022-11-26 (×3): 237 mL via ORAL

## 2022-11-24 MED ORDER — AZITHROMYCIN 500 MG PO TABS
500.0000 mg | ORAL_TABLET | Freq: Every day | ORAL | Status: DC
  Administered 2022-11-25 – 2022-11-26 (×2): 500 mg via ORAL
  Filled 2022-11-24 (×2): qty 1

## 2022-11-24 MED ORDER — ADULT MULTIVITAMIN W/MINERALS CH
1.0000 | ORAL_TABLET | Freq: Every day | ORAL | Status: DC
  Administered 2022-11-24 – 2022-11-26 (×3): 1 via ORAL
  Filled 2022-11-24 (×3): qty 1

## 2022-11-24 NOTE — Progress Notes (Signed)
Initial Nutrition Assessment  DOCUMENTATION CODES:   Obesity unspecified  INTERVENTION:  - Add Glucerna Shake po BID, each supplement provides 220 kcal and 10 grams of protein  - Add MVI q day.   NUTRITION DIAGNOSIS:   Inadequate oral intake related to poor appetite as evidenced by per patient/family report.  GOAL:   Patient will meet greater than or equal to 90% of their needs  MONITOR:   PO intake, Supplement acceptance  REASON FOR ASSESSMENT:   Malnutrition Screening Tool    ASSESSMENT:   72 y.o. female admits related to AMS. PMH includes: arthritis, GERD, HTN, PVD, pneumonia, sciatica, T2DM, UTI. Pt is currently receiving medical management related to pneumonia.  Meds reviewed: Vit C, sliding scale insulin. Labs reviewed: BUN/Creatinine elevated.   RD working remotely. Pt is currently oriented x2. No intakes documented per record at this time. No significant wt loss per record. RD will add supplements. Will continue to closely monitor PO intakes.   NUTRITION - FOCUSED PHYSICAL EXAM:  Attempt at f/u.   Diet Order:   Diet Order             DIET SOFT Room service appropriate? No; Fluid consistency: Thin  Diet effective now                   EDUCATION NEEDS:   Not appropriate for education at this time  Skin:  Skin Assessment: Reviewed RN Assessment  Last BM:  5/2 - type 7  Height:   Ht Readings from Last 1 Encounters:  12/21/21 5\' 1"  (1.549 m)    Weight:   Wt Readings from Last 1 Encounters:  11/24/22 73.1 kg    Ideal Body Weight:     BMI:  Body mass index is 30.45 kg/m.  Estimated Nutritional Needs:   Kcal:  1825-2190 kcals  Protein:  90-110 gm  Fluid:  >/= 1.8 L  Bethann Humble, RD, LDN, CNSC.

## 2022-11-24 NOTE — Progress Notes (Addendum)
PROGRESS NOTE    Mariah [name]  WUJ:[id number] DOB: 1950-11-13 DOA: 11/23/2022 PCP: Marinda Elk, MD    Brief Narrative:   Patient is a 71 years old female with past medical history of asthma, chronic venous stasis, thyroid nodule, obesity, anemia, CKD, dementia, history of falls, encephalopathy, seizure disorder, vitamin D deficiency, DM2, migraines, UTI, pneumonia presented to the hospital from skilled nursing facility with altered mental status with increased cough.  Patient was more fatigued and disoriented than usual. In the ED,patient was afebrile.  COVID influenza and RSV negative.  Labs showed leukocytosis at 14 K, anemia with hemoglobin of 8.2, thrombocytosis, creatinine elevated at 2.60, lactate at 1.2.  Urinalysis was negative.  INR was elevated at 1.7.  CT scan of the chest showed pneumonitis.  Mild colitis reported as well.  Small layering pleural effusions and diffuse bronchial thickening.  Patient received Rocephin and Zithromax, Ringer lactate in the ED and was admitted to the hospital for further evaluation and treatment.  Assessment and plan.  Acute metabolic encephalopathy likely secondary to pneumonia.  On the background of dementia as per the family..  Urinalysis was negative.  Patient febrile with a temperature of 100.9 F on presentation..  Improving.  Temperature max of 99.9 at this time.  Sepsis secondary to pneumonia.  Patient was febrile, with leukocytosis and altered mental status with underlying pneumonia suggestive of sepsis.  Continue IV Rocephin and Zithromax.  Pending blood cultures from the ED.  Urinary history of antigen negative.  Pending Legionella antigen.  WBC today at 11.6 from initial 14.0.  Trending down CBC.  History of asthma, Continue bronchodilators.  Closely monitor.  Compensated.  History of hypertension.  Continue to hold losartan for now.  Continue hydralazine.  Mild AKI on chronic kidney disease, Closely monitor creatinine levels.   Creatinine today at 2.3 from 2.6..  Last creatinine of 1.9, 3 months back.  Continue normal saline 75/h.  History of seizure disorder, On Keppra as outpatient.  Will resume  Diabetes mellitus type 2, Last hemoglobin A1c of 5.9, 1-year back.  Patient is on metformin as outpatient.  Continue sliding scale insulin while in the hospital.  History of fall and debility. Patient has been at the skilled nursing facility for the last 5 years.  Plan for skilled nursing facility on discharge.  TOC has been consulted.    DVT prophylaxis: heparin injection 5,000 Units Start: 11/23/22 0900   Code Status:     Code Status: Full Code  Disposition: Skilled nursing facility when medically okay.  Likely in 1 to 2 days  Status is: Inpatient  Remains inpatient appropriate because: IV antibiotic, altered mental status, pneumonia,   Family Communication: Spoke with the patient's niece on the phone 11/23/2022  Consultants:  None  Procedures:  None  Antimicrobials:  Rocephin azithromycin  Anti-infectives (From admission, onward)    Start     Dose/Rate Route Frequency Ordered Stop   11/24/22 1000  cefTRIAXone (ROCEPHIN) 2 g in sodium chloride 0.9 % 100 mL IVPB        2 g 200 mL/hr over 30 Minutes Intravenous Every 24 hours 11/23/22 0845 11/28/22 0959   11/24/22 0600  azithromycin (ZITHROMAX) 500 mg in sodium chloride 0.9 % 250 mL IVPB        500 mg 250 mL/hr over 60 Minutes Intravenous Every 24 hours 11/23/22 0845 11/28/22 0559   11/23/22 0900  cefTRIAXone (ROCEPHIN) 1 g in sodium chloride 0.9 % 100 mL IVPB  1 g 200 mL/hr over 30 Minutes Intravenous NOW 11/23/22 0850 11/23/22 0953   11/23/22 0515  cefTRIAXone (ROCEPHIN) 1 g in sodium chloride 0.9 % 100 mL IVPB        1 g 200 mL/hr over 30 Minutes Intravenous  Once 11/23/22 0504 11/23/22 0616   11/23/22 0515  azithromycin (ZITHROMAX) 500 mg in sodium chloride 0.9 % 250 mL IVPB        500 mg 250 mL/hr over 60 Minutes Intravenous  Once  11/23/22 0504 11/23/22 0720      Subjective: Today, patient was seen and examined at bedside.  Patient states that that she feels little better today has no nausea vomiting fever dyspnea or chills.  Objective: Vitals:   11/24/22 0500 11/24/22 0640 11/24/22 0736 11/24/22 0740  BP:  111/62 (!) 108/55   Pulse:  (!) 110 (!) 103   Resp:   18   Temp:  99.5 F (37.5 C) 98 F (36.7 C)   TempSrc:  Oral    SpO2:  100% 98% 98%  Weight: 73.1 kg       Intake/Output Summary (Last 24 hours) at 11/24/2022 0952 Last data filed at 11/24/2022 0830 Gross per 24 hour  Intake 297 ml  Output --  Net 297 ml   Filed Weights   11/24/22 0500  Weight: 73.1 kg    Physical Examination: Body mass index is 30.45 kg/m.  General:  Average built, not in obvious distress HENT:   No scleral pallor or icterus noted. Oral mucosa is moist.  Chest:    Diminished breath sounds bilaterally. No crackles or wheezes.  CVS: S1 &S2 heard. No murmur.  Regular rate and rhythm. Abdomen: Soft, nontender, nondistended.  Bowel sounds are heard.   Extremities: No cyanosis, clubbing or edema.  Peripheral pulses are palpable. Psych: Alert, awake and oriented to place, mildly anxious CNS:  No cranial nerve deficits.  Power equal in all extremities.   Skin: Warm and dry.  Bilateral lower extremity chronic venous stasis changes.  Data Reviewed:   CBC: Recent Labs  Lab 11/23/22 0252 11/24/22 0757  WBC 14.0* 11.6*  NEUTROABS 11.7*  --   HGB 8.2* 7.2*  HCT 27.7* 23.6*  MCV 92.0 88.7  PLT 581* 520*    Basic Metabolic Panel: Recent Labs  Lab 11/23/22 0252 11/24/22 0757  NA 142 140  K 4.3 3.7  CL 111 110  CO2 21* 21*  GLUCOSE 79 76  BUN 36* 27*  CREATININE 2.68* 2.38*  CALCIUM 8.6* 8.0*  MG  --  1.9    Liver Function Tests: Recent Labs  Lab 11/23/22 0252 11/24/22 0757  AST 19 19  ALT 12 11  ALKPHOS 74 62  BILITOT 0.6 0.5  PROT 7.5 6.5  ALBUMIN 1.9* 1.5*     Radiology Studies: CT CHEST ABDOMEN  PELVIS WO CONTRAST  Result Date: 11/23/2022 CLINICAL DATA:  Abnormal x-ray. Lung opacities and coughing. Concern for pneumonia. Leukocytosis. EXAM: CT CHEST, ABDOMEN AND PELVIS WITHOUT CONTRAST TECHNIQUE: Multidetector CT imaging of the chest, abdomen and pelvis was performed following the standard protocol without IV contrast. RADIATION DOSE REDUCTION: This exam was performed according to the departmental dose-optimization program which includes automated exposure control, adjustment of the mA and/or kV according to patient size and/or use of iterative reconstruction technique. COMPARISON:  PA and lateral chest today and 07/27/2022, and CTA chest, abdomen and pelvis 02/06/2016 FINDINGS: CT CHEST FINDINGS Cardiovascular: The cardiac size is normal. There is a small chronic pericardial effusion  to the right anteriorly. There are no visible coronary calcifications. There is aortic atherosclerosis without aneurysm with normal great vessel branching and normal caliber pulmonary veins and arteries. Mediastinum/Nodes: There is heterogeneous 4.2 cm mass of the thyroid isthmus and left lobe which has previously been recommended for biopsy as of the last file ultrasound from 09/26/2017. There is no record of biopsy having been performed. If this was not performed elsewhere then biopsy should be considered at this time. Small hiatal hernia. There is mild thickening of the distal thoracic esophagus probably due to esophagitis and most likely reflux related. Shotty subcentimeter short axis prevascular and right paratracheal lymph nodes are unchanged. No intrathoracic or axillary adenopathy is seen. Lungs/Pleura: There are small symmetric layering pleural effusions new since the prior study. There are mild posterior atelectatic changes. There is diffuse bronchial thickening without bronchiectasis or bronchial plugging. Subpleural septal prominence is seen in the lung bases and could be due to interstitial edema or interstitial  pneumonitis. The lungs are otherwise clear. Musculoskeletal: There is osteopenia, degenerative changes of the spine and slight thoracic dextroscoliosis. The ribcage is intact. CT ABDOMEN PELVIS FINDINGS Hepatobiliary: There is homogeneous noncontrast attenuation of the liver. No mass is seen without contrast. Multiple stones interval developed in the gallbladder but no wall thickening or biliary dilatation. Pancreas: Unremarkable without contrast. Spleen: Unremarkable without contrast. Adrenals/Urinary Tract: There is no adrenal mass. No focal abnormality in the unenhanced renal cortex. There are occasional punctate nonobstructive caliceal stones in the left kidney but no nephrolithiasis seen on the right. There are no obstructing stones or hydroureteronephrosis on either side. There is no bladder thickening. The superior bladder is indented by the enlarged lobular fibroid uterus. Stomach/Bowel: Small hiatal hernia. Otherwise unremarkable stomach and small bowel. The appendix is normal. Small bowel again noted extending out lateral to the ascending colon consistent with a nonobstructive transmesenteric internal hernia. The ascending and proximal transverse colon are unremarkable apart from retained stool. There is mild wall thickening and stranding consistent with colitis in the distal transverse colon sparing the splenic flexure. Beginning in the mid third of the descending colon and continuing through most of the sigmoid segment, there is wall thickening and and more prominent inflammatory stranding consistent with additional colitis. No pneumatosis is seen. There are diverticula over portions of the affected colon, but the length of involvement would be atypical for diverticulitis. Infectious or inflammatory colitis are more likely. Ischemic etiology is less likely given the distribution. The rectal wall is unremarkable. Vascular/Lymphatic: Aortic atherosclerosis. No enlarged abdominal or pelvic lymph nodes.  Borderline prominent external iliac chain and bilateral inguinal borderline sized lymph nodes are unchanged in the interval since 2017. No other adenopathy is seen. There is mild aortoiliac calcification. No significant visceral branch vessel calcific plaques including the SMA and IMA, but no arterial patency is not established with a noncontrast exam. Reproductive: Enlarged fibroid uterus is again noted with lobulation, multiple calcified fibroids and superior mass effect on the bladder. The ovaries do not appear enlarged. Other: There is mild body wall edema in the flanks and thighs. There is no bowel pneumatosis. There is trace reactive fluid in left pericolic gutter but no other free fluid. There is no free air, abscess or free hemorrhage. There are no incarcerated hernias. Small umbilical fat hernia. Musculoskeletal: There is ankylosis across the SI joints anteriorly with bridging osteophytes. There is pelvic enthesopathy. Osteopenia and degenerative change lumbar spine. There is advanced L4-5 facet hypertrophy with grade 1 degenerative anterolisthesis. IMPRESSION: 1.  Small layering pleural effusions with basilar subpleural septal thickening which could be due to interstitial edema or interstitial pneumonitis. The cardiac size is normal. 2. Diffuse bronchial thickening without bronchiectasis or bronchial plugging. 3. Aortic atherosclerosis. 4. Small hiatal hernia with probable reflux esophagitis in the distal thoracic esophagus. 5. There is no record of thyroid biopsy having been performed. If this was not performed elsewhere then biopsy should be considered at this time for the 4.2 cm thyroid mass noted today and previously. 6. Distal transverse colitis, with separate mid/distal descending and sigmoid colitis, most likely infectious or inflammatory. Ischemic etiology is less likely given the distribution. There are diverticula over portions of the affected colon, but the length of involvement would be atypical  for diverticulitis. 7. Nonobstructive left micronephrolithiasis. 8. Cholelithiasis. 9. Nonobstructive transmesenteric internal hernia, unchanged. 10. Enlarged fibroid uterus. 11. Body wall edema in the flanks and thighs. Aortic Atherosclerosis (ICD10-I70.0). Electronically Signed   By: Almira Bar M.D.   On: 11/23/2022 06:15   DG Hand Complete Left  Result Date: 11/23/2022 CLINICAL DATA:  Dementia pain EXAM: LEFT HAND - COMPLETE 3+ VIEW COMPARISON:  None Available. FINDINGS: No fracture or malalignment. Chronic appearing deformity of distal scaphoid. Mild degenerative arthritis at the first [facility] joint, second MCP joint and at the distal IP joint. IMPRESSION: No acute osseous abnormality Electronically Signed   By: Jasmine Pang M.D.   On: 11/23/2022 03:32   DG Wrist Complete Left  Result Date: 11/23/2022 CLINICAL DATA:  Wrist pain EXAM: LEFT WRIST - COMPLETE 3+ VIEW COMPARISON:  None Available. FINDINGS: No fracture or malalignment. Degenerative changes at the first [facility] joint. Possible chronic deformity of the scaphoid. Diffuse wrist swelling. IMPRESSION: No acute osseous abnormality. Degenerative changes at the first [facility] joint. Possible chronic deformity of the scaphoid. Electronically Signed   By: Jasmine Pang M.D.   On: 11/23/2022 03:31   DG Chest 2 View  Result Date: 11/23/2022 CLINICAL DATA:  Suspected sepsis EXAM: CHEST - 2 VIEW COMPARISON:  07/27/2022 FINDINGS: Low lung volumes. Cardiomegaly with diffuse interstitial opacity most likely representing pulmonary edema. Possible small pleural effusions. Aortic atherosclerosis. No focal airspace disease or pneumothorax IMPRESSION: Cardiomegaly with diffuse interstitial opacity most likely representing pulmonary edema. Possible small pleural effusions. Electronically Signed   By: Jasmine Pang M.D.   On: 11/23/2022 03:29      LOS: 1 day    Joycelyn Das, MD [facility] Available via Epic secure chat 7am-7pm After these hours, please  refer to coverage provider listed on [email or web address] 11/24/2022, 9:52 AM

## 2022-11-24 NOTE — Progress Notes (Signed)
PHARMACIST - PHYSICIAN COMMUNICATION DR:   Tyson Babinski CONCERNING: Antibiotic IV to Oral Route Change Policy  RECOMMENDATION: This patient is receiving azithromycin by the intravenous route.  Based on criteria approved by the Pharmacy and Therapeutics Committee, the antibiotic(s) is/are being converted to the equivalent oral dose form(s).   DESCRIPTION: These criteria include: Patient being treated for a respiratory tract infection, urinary tract infection, cellulitis or clostridium difficile associated diarrhea if on metronidazole The patient is not neutropenic and does not exhibit a GI malabsorption state The patient is eating (either orally or via tube) and/or has been taking other orally administered medications for a least 24 hours The patient is improving clinically and has a Tmax < 100.5  If you have questions about this conversion, please contact the Pharmacy Department  []   [phone] )  [facility] []   [phone] )  [facility] [x]   [phone] )  [facility] []   [phone] )  [facility] []   [phone] )  [facility]

## 2022-11-25 DIAGNOSIS — I872 Venous insufficiency (chronic) (peripheral): Secondary | ICD-10-CM | POA: Diagnosis not present

## 2022-11-25 DIAGNOSIS — J189 Pneumonia, unspecified organism: Secondary | ICD-10-CM | POA: Diagnosis not present

## 2022-11-25 DIAGNOSIS — J45909 Unspecified asthma, uncomplicated: Secondary | ICD-10-CM | POA: Diagnosis not present

## 2022-11-25 DIAGNOSIS — G9341 Metabolic encephalopathy: Secondary | ICD-10-CM | POA: Diagnosis not present

## 2022-11-25 LAB — GLUCOSE, CAPILLARY
Glucose-Capillary: 72 mg/dL (ref 70–99)
Glucose-Capillary: 115 mg/dL — ABNORMAL HIGH (ref 70–99)
Glucose-Capillary: 80 mg/dL (ref 70–99)
Glucose-Capillary: 109 mg/dL — ABNORMAL HIGH (ref 70–99)

## 2022-11-25 LAB — BASIC METABOLIC PANEL
Anion gap: 8 (ref 5–15)
BUN: 20 mg/dL (ref 8–23)
CO2: 19 mmol/L — ABNORMAL LOW (ref 22–32)
Calcium: 7.9 mg/dL — ABNORMAL LOW (ref 8.9–10.3)
Chloride: 109 mmol/L (ref 98–111)
Creatinine, Ser: 2.27 mg/dL — ABNORMAL HIGH (ref 0.44–1.00)
GFR, Estimated: 23 mL/min — ABNORMAL LOW (ref >60)
Glucose, Bld: 80 mg/dL (ref 70–99)
Potassium: 3.5 mmol/L (ref 3.5–5.1)
Sodium: 136 mmol/L (ref 135–145)

## 2022-11-25 LAB — CBC
HCT: 23.6 % — ABNORMAL LOW (ref 36.0–46.0)
Hemoglobin: 7 g/dL — ABNORMAL LOW (ref 12.0–15.0)
MCH: 27.1 pg (ref 26.0–34.0)
MCHC: 29.7 g/dL — ABNORMAL LOW (ref 30.0–36.0)
MCV: 91.5 fL (ref 80.0–100.0)
Platelets: 491 10*3/uL — ABNORMAL HIGH (ref 150–400)
RBC: 2.58 MIL/uL — ABNORMAL LOW (ref 3.87–5.11)
RDW: 15.8 % — ABNORMAL HIGH (ref 11.5–15.5)
WBC: 10.2 10*3/uL (ref 4.0–10.5)
nRBC: 0 % (ref 0.0–0.2)

## 2022-11-25 LAB — LEGIONELLA PNEUMOPHILA SEROGP 1 UR AG: L. pneumophila Serogp 1 Ur Ag: NEGATIVE

## 2022-11-25 LAB — MAGNESIUM: Magnesium: 1.8 mg/dL (ref 1.7–2.4)

## 2022-11-25 MED ORDER — LOPERAMIDE HCL 2 MG PO CAPS
2.0000 mg | ORAL_CAPSULE | ORAL | Status: DC | PRN
  Administered 2022-11-25 (×2): 2 mg via ORAL
  Filled 2022-11-25 (×2): qty 1

## 2022-11-25 NOTE — Evaluation (Signed)
Physical Therapy Evaluation Patient Details Name: Mariah [name] MRN: [id number] DOB: 03/30/1951 Today's Date: 11/25/2022  History of Present Illness  72 yo female admitted from SNF with AMS. CT shows pneumonitis and pleural effusions. Pt workup underway for PNA with secondary sepsis and acute metabolic encephalopathy PMH asthma, chronic venous stasis, thyroid nodule, obesity, anemia, CKA, dementia hx of falls, encephalopathy seizure DM2  Clinical Impression  PTA, pt is from [facility], where she uses a Rollator to ambulate short distances and is independent for bathroom transfers. Pt uses a wheelchair for longer distance mobility. Pt received supine in bed without awareness of large episode of stool incontinence. Pt requiring two person moderate assist for rolling to R/L for peri care and linen change. Further mobility deferred this date due to frequency of diarrhea. Will follow acutely to continue to progress mobility as tolerated.     Recommendations for follow up therapy are one component of a multi-disciplinary discharge planning process, led by the attending physician.  Recommendations may be updated based on patient status, additional functional criteria and insurance authorization.  Follow Up Recommendations Can patient physically be transported by private vehicle: No     Assistance Recommended at Discharge Frequent or constant Supervision/Assistance  Patient can return home with the following  A lot of help with walking and/or transfers;A lot of help with bathing/dressing/bathroom    Equipment Recommendations None recommended by PT  Recommendations for Other Services       Functional Status Assessment Patient has had a recent decline in their functional status and demonstrates the ability to make significant improvements in function in a reasonable and predictable amount of time.     Precautions / Restrictions Precautions Precautions: Fall      Mobility  Bed  Mobility Overal bed mobility: Needs Assistance Bed Mobility: Rolling Rolling: +2 for physical assistance, Mod assist         General bed mobility comments: Assist to initiate and use contralateral arm to reach for rail    Transfers                   General transfer comment: deferred due to diarrhea    Ambulation/Gait                  Stairs            Wheelchair Mobility    Modified Rankin (Stroke Patients Only)       Balance                                             Pertinent Vitals/Pain Pain Assessment Pain Assessment: Faces Faces Pain Scale: Hurts whole lot Pain Location: ankles, R wrist and any tactile to calf area on R LE Pain Descriptors / Indicators: Discomfort, Grimacing, Guarding Pain Intervention(s): Limited activity within patient's tolerance, Monitored during session    Home Living Family/patient expects to be discharged to:: Skilled nursing facility                   Additional Comments: unable to provide any PTA information. Noted from chart review from SNF level care.    Prior Function Prior Level of Function : Needs assist             Mobility Comments: using Rollator for room distances, w/c for longer distances. independent bathroom transfers  Hand Dominance   Dominant Hand: Right    Extremity/Trunk Assessment   Upper Extremity Assessment Upper Extremity Assessment: Defer to OT evaluation RUE Deficits / Details: using R UE to guard L UE and hold LUE Deficits / Details: wrist in flexion with complaint of pain, Pt AAROM elbow flexion WFL and should flexion. Pt noted to have scapula elevation due to discomfort of R wrist, pt able to completely make a fist LUE Coordination: decreased fine motor    Lower Extremity Assessment Lower Extremity Assessment: Generalized weakness    Cervical / Trunk Assessment Cervical / Trunk Assessment: Kyphotic  Communication      Cognition  Arousal/Alertness: Awake/alert Behavior During Therapy: Flat affect Overall Cognitive Status: History of cognitive impairments - at baseline                                          General Comments General comments (skin integrity, edema, etc.): high risk for skin break down. barrrier cream applied due to frequency of incontience at this time    Exercises     Assessment/Plan    PT Assessment Patient needs continued PT services  PT Problem List Decreased strength;Decreased activity tolerance;Decreased mobility;Decreased balance;Decreased cognition;Decreased safety awareness       PT Treatment Interventions DME instruction;Gait training;Functional mobility training;Therapeutic activities;Therapeutic exercise;Balance training;Patient/family education    PT Goals (Current goals can be found in the Care Plan section)  Acute Rehab PT Goals Patient Stated Goal: did not state PT Goal Formulation: With patient Time For Goal Achievement: 12/09/22 Potential to Achieve Goals: Fair    Frequency Min 3X/week     Co-evaluation               AM-PAC PT "6 Clicks" Mobility  Outcome Measure Help needed turning from your back to your side while in a flat bed without using bedrails?: A Lot Help needed moving from lying on your back to sitting on the side of a flat bed without using bedrails?: A Lot Help needed moving to and from a bed to a chair (including a wheelchair)?: A Lot Help needed standing up from a chair using your arms (e.g., wheelchair or bedside chair)?: A Lot Help needed to walk in hospital room?: Total Help needed climbing 3-5 steps with a railing? : Total 6 Click Score: 10    End of Session   Activity Tolerance: Other (comment) (limited by diarrhea) Patient left: in bed;with call bell/phone within reach;with bed alarm set Nurse Communication: Mobility status PT Visit Diagnosis: Muscle weakness (generalized) (M62.81)    Time: 1610-9604 PT Time  Calculation (min) (ACUTE ONLY): 25 min   Charges:   PT Evaluation $PT Eval Moderate Complexity: 1 Mod PT Treatments $Therapeutic Activity: 8-22 mins        Lillia Pauls, PT, DPT Acute Rehabilitation Services Office [phone]   Norval Morton 11/25/2022, 12:44 PM

## 2022-11-25 NOTE — Progress Notes (Addendum)
PROGRESS NOTE    Mariah [name]  ZOX:[id number] DOB: 06-Dec-1950 DOA: 11/23/2022 PCP: Marinda Elk, MD    Brief Narrative:   Patient is a 72 years old female with past medical history of asthma, chronic venous stasis, thyroid nodule, obesity, anemia, CKD, dementia, history of falls, encephalopathy, seizure disorder, vitamin D deficiency, DM2, migraines, UTI, pneumonia presented to the hospital from skilled nursing facility with altered mental status with increased cough.  Patient was more fatigued and disoriented than usual. In the ED,patient was afebrile.  COVID influenza and RSV negative.  Labs showed leukocytosis at 14 K, anemia with hemoglobin of 8.2, thrombocytosis, creatinine elevated at 2.60, lactate at 1.2.  Urinalysis was negative.  INR was elevated at 1.7.  CT scan of the chest showed pneumonitis.  Mild colitis reported as well.  Small layering pleural effusions and diffuse bronchial thickening.  Patient received Rocephin and Zithromax, Ringer lactate in the ED and was admitted to the hospital for further evaluation and treatment.  Assessment and plan.  Acute metabolic encephalopathy likely secondary to pneumonia.  On the background of dementia as per the family..  Urinalysis was negative.  Patient febrile with a temperature of 100.9 F on presentation..  Improving.  Temperature max of 59F at this time.  Sepsis secondary to pneumonia.    Continue IV Rocephin and Zithromax.  Negative blood cultures in 2 days..  Urinary history of antigen negative.  Pending Legionella antigen.  WBC trended down to 10.2 from 14.0 on presentation.  History of asthma, Continue bronchodilators.  Closely monitor.  Compensated.  History of hypertension.  Continue to hold losartan for now.  Continue hydralazine.  Mild AKI on chronic kidney disease, Closely monitor creatinine levels.  Creatinine today at 2.2 from initial 2.6..  Last creatinine of 1.9, 3 months back.  CV normal saline during  hospitalization.  History of seizure disorder, Continue Keppra.  Diabetes mellitus type 2, Last hemoglobin A1c of 5.9, 1-year back.  Patient is on metformin as outpatient.  Continue sliding scale insulin while in the hospital.  History of fall and debility. Patient has been at the skilled nursing facility for the last 5 years.  Plan for skilled nursing facility on discharge.  TOC has been consulted.  PT OT has been consulted.  Multiple episodes of diarrhea.  C. difficile was negative.  Will add loperamide.  Obesity. Body mass index is 31.62 kg/m.  Would benefit from continued weight loss as outpatient.    DVT prophylaxis: heparin injection 5,000 Units Start: 11/23/22 0900   Code Status:     Code Status: Full Code  Disposition: Skilled nursing facility likely on 11/26/2022 if remains clinically stable..  Status is: Inpatient  Remains inpatient appropriate because: IV antibiotic, altered mental status, pneumonia,   Family Communication: Spoke with the patient's niece on the phone 11/23/2022  Consultants:  None  Procedures:  None  Antimicrobials:  Rocephin azithromycin  Anti-infectives (From admission, onward)    Start     Dose/Rate Route Frequency Ordered Stop   11/25/22 1000  azithromycin (ZITHROMAX) tablet 500 mg        500 mg Oral Daily 11/24/22 1032 11/28/22 0959   11/24/22 1000  cefTRIAXone (ROCEPHIN) 2 g in sodium chloride 0.9 % 100 mL IVPB        2 g 200 mL/hr over 30 Minutes Intravenous Every 24 hours 11/23/22 0845 11/28/22 0959   11/24/22 0600  azithromycin (ZITHROMAX) 500 mg in sodium chloride 0.9 % 250 mL IVPB  Status:  Discontinued        500 mg 250 mL/hr over 60 Minutes Intravenous Every 24 hours 11/23/22 0845 11/24/22 1032   11/23/22 0900  cefTRIAXone (ROCEPHIN) 1 g in sodium chloride 0.9 % 100 mL IVPB        1 g 200 mL/hr over 30 Minutes Intravenous NOW 11/23/22 0850 11/23/22 0953   11/23/22 0515  cefTRIAXone (ROCEPHIN) 1 g in sodium chloride 0.9 % 100  mL IVPB        1 g 200 mL/hr over 30 Minutes Intravenous  Once 11/23/22 0504 11/23/22 0616   11/23/22 0515  azithromycin (ZITHROMAX) 500 mg in sodium chloride 0.9 % 250 mL IVPB        500 mg 250 mL/hr over 60 Minutes Intravenous  Once 11/23/22 0504 11/23/22 0720      Subjective: Today, patient was seen and examined at bedside.  Poor historian.  Feels a little better.  Has less cough.  Has generalized weakness.  Denies dyspnea or chest pain.  Nursing staff reported that the patient did have multiple episodes of diarrhea.  Objective: Vitals:   11/24/22 2001 11/25/22 0500 11/25/22 0727 11/25/22 0755  BP:  102/62  129/88  Pulse:  (!) 102    Resp:  16  18  Temp:  98.8 F (37.1 C)  98 F (36.7 C)  TempSrc:  Oral    SpO2: 100% 100% 98% 100%  Weight:  75.9 kg      Intake/Output Summary (Last 24 hours) at 11/25/2022 1037 Last data filed at 11/25/2022 0008 Gross per 24 hour  Intake 3198.09 ml  Output --  Net 3198.09 ml    Filed Weights   11/24/22 0500 11/25/22 0500  Weight: 73.1 kg 75.9 kg    Physical Examination: Body mass index is 31.62 kg/m.   General: Obese built, not in obvious distress, on room air, Communicative, HENT:   No scleral pallor or icterus noted. Oral mucosa is moist.  Chest:    Diminished breath sounds bilaterally. No crackles or wheezes.  CVS: S1 &S2 heard. No murmur.  Regular rate and rhythm. Abdomen: Soft, nontender, nondistended.  Bowel sounds are heard.   Extremities: No cyanosis, clubbing or edema.  Peripheral pulses are palpable. Psych: Alert, awake and oriented to place, has underlying dementia, CNS:  No cranial nerve deficits.  Power equal in all extremities.   Skin: Warm and dry.  Bilateral lower extremity chronic venous stasis changes, not much edema..  Data Reviewed:   CBC: Recent Labs  Lab 11/23/22 0252 11/24/22 0757 11/25/22 0753  WBC 14.0* 11.6* 10.2  NEUTROABS 11.7*  --   --   HGB 8.2* 7.2* 7.0*  HCT 27.7* 23.6* 23.6*  MCV 92.0 88.7  91.5  PLT 581* 520* 491*     Basic Metabolic Panel: Recent Labs  Lab 11/23/22 0252 11/24/22 0757 11/25/22 0753  NA 142 140 136  K 4.3 3.7 3.5  CL 111 110 109  CO2 21* 21* 19*  GLUCOSE 79 76 80  BUN 36* 27* 20  CREATININE 2.68* 2.38* 2.27*  CALCIUM 8.6* 8.0* 7.9*  MG  --  1.9 1.8     Liver Function Tests: Recent Labs  Lab 11/23/22 0252 11/24/22 0757  AST 19 19  ALT 12 11  ALKPHOS 74 62  BILITOT 0.6 0.5  PROT 7.5 6.5  ALBUMIN 1.9* 1.5*      Radiology Studies: No results found.    LOS: 2 days    Joycelyn Das, MD [facility] Available  via Epic secure chat 7am-7pm After these hours, please refer to coverage provider listed on [email or web address] 11/25/2022, 10:37 AM

## 2022-11-25 NOTE — Care Management Important Message (Signed)
Important Message  Patient Details  Name: Mariah [name] MRN: [id number] Date of Birth: 1951/01/29   Medicare Important Message Given:  Yes     Karliah Kowalchuk Stefan Church 11/25/2022, 2:50 PM

## 2022-11-25 NOTE — Plan of Care (Signed)
Patient is resting in bed with stable vitals. Pain has been addressed and managed. All meds have been given. Patient has multiple occurrences of diarrhea that poses risk to her skin. MD has been notified for an anti-diarrheal agent. Safety precautions are in place. Plan of care is ongoing.  Problem: Activity: Goal: Ability to tolerate increased activity will improve 11/25/2022 1303 by Clydie Braun, RN Outcome: Progressing 11/25/2022 1303 by Clydie Braun, RN Outcome: Progressing   Problem: Clinical Measurements: Goal: Ability to maintain a body temperature in the normal range will improve 11/25/2022 1303 by Clydie Braun, RN Outcome: Progressing 11/25/2022 1303 by Clydie Braun, RN Outcome: Progressing   Problem: Respiratory: Goal: Ability to maintain adequate ventilation will improve 11/25/2022 1303 by Clydie Braun, RN Outcome: Progressing 11/25/2022 1303 by Clydie Braun, RN Outcome: Progressing Goal: Ability to maintain a clear airway will improve 11/25/2022 1303 by Clydie Braun, RN Outcome: Progressing 11/25/2022 1303 by Clydie Braun, RN Outcome: Progressing   Problem: Education: Goal: Ability to describe self-care measures that may prevent or decrease complications (Diabetes Survival Skills Education) will improve 11/25/2022 1303 by Clydie Braun, RN Outcome: Progressing 11/25/2022 1303 by Clydie Braun, RN Outcome: Progressing Goal: Individualized Educational Video(s) 11/25/2022 1303 by Clydie Braun, RN Outcome: Progressing 11/25/2022 1303 by Clydie Braun, RN Outcome: Progressing   Problem: Coping: Goal: Ability to adjust to condition or change in health will improve 11/25/2022 1303 by Clydie Braun, RN Outcome: Progressing 11/25/2022 1303 by Clydie Braun, RN Outcome: Progressing   Problem: Fluid Volume: Goal: Ability to maintain a balanced intake and output will improve 11/25/2022 1303 by Clydie Braun, RN Outcome: Progressing 11/25/2022 1303 by Clydie Braun, RN Outcome: Progressing   Problem: Health Behavior/Discharge Planning: Goal: Ability to identify and utilize available resources and services will improve 11/25/2022 1303 by Clydie Braun, RN Outcome: Progressing 11/25/2022 1303 by Clydie Braun, RN Outcome: Progressing Goal: Ability to manage health-related needs will improve 11/25/2022 1303 by Clydie Braun, RN Outcome: Progressing 11/25/2022 1303 by Clydie Braun, RN Outcome: Progressing   Problem: Metabolic: Goal: Ability to maintain appropriate glucose levels will improve 11/25/2022 1303 by Clydie Braun, RN Outcome: Progressing 11/25/2022 1303 by Clydie Braun, RN Outcome: Progressing   Problem: Nutritional: Goal: Maintenance of adequate nutrition will improve 11/25/2022 1303 by Clydie Braun, RN Outcome: Progressing 11/25/2022 1303 by Clydie Braun, RN Outcome: Progressing Goal: Progress toward achieving an optimal weight will improve 11/25/2022 1303 by Clydie Braun, RN Outcome: Progressing 11/25/2022 1303 by Clydie Braun, RN Outcome: Progressing   Problem: Skin Integrity: Goal: Risk for impaired skin integrity will decrease 11/25/2022 1303 by Clydie Braun, RN Outcome: Progressing 11/25/2022 1303 by Clydie Braun, RN Outcome: Progressing   Problem: Tissue Perfusion: Goal: Adequacy of tissue perfusion will improve 11/25/2022 1303 by Clydie Braun, RN Outcome: Progressing 11/25/2022 1303 by Clydie Braun, RN Outcome: Progressing   Problem: Education: Goal: Knowledge of General Education information will improve Description: Including pain rating scale, medication(s)/side effects and non-pharmacologic comfort measures 11/25/2022 1303 by Clydie Braun, RN Outcome: Progressing 11/25/2022 1303 by Clydie Braun, RN Outcome: Progressing   Problem: Health  Behavior/Discharge Planning: Goal: Ability to manage health-related needs will improve 11/25/2022 1303 by Clydie Braun, RN Outcome: Progressing 11/25/2022 1303 by Clydie Braun, RN Outcome: Progressing   Problem: Clinical Measurements: Goal: Ability to maintain clinical measurements within normal limits  will improve 11/25/2022 1303 by Clydie Braun, RN Outcome: Progressing 11/25/2022 1303 by Clydie Braun, RN Outcome: Progressing Goal: Will remain free from infection 11/25/2022 1303 by Clydie Braun, RN Outcome: Progressing 11/25/2022 1303 by Clydie Braun, RN Outcome: Progressing Goal: Diagnostic test results will improve 11/25/2022 1303 by Clydie Braun, RN Outcome: Progressing 11/25/2022 1303 by Clydie Braun, RN Outcome: Progressing Goal: Respiratory complications will improve 11/25/2022 1303 by Clydie Braun, RN Outcome: Progressing 11/25/2022 1303 by Clydie Braun, RN Outcome: Progressing Goal: Cardiovascular complication will be avoided 11/25/2022 1303 by Clydie Braun, RN Outcome: Progressing 11/25/2022 1303 by Clydie Braun, RN Outcome: Progressing   Problem: Activity: Goal: Risk for activity intolerance will decrease 11/25/2022 1303 by Clydie Braun, RN Outcome: Progressing 11/25/2022 1303 by Clydie Braun, RN Outcome: Progressing   Problem: Nutrition: Goal: Adequate nutrition will be maintained 11/25/2022 1303 by Clydie Braun, RN Outcome: Progressing 11/25/2022 1303 by Clydie Braun, RN Outcome: Progressing   Problem: Coping: Goal: Level of anxiety will decrease 11/25/2022 1303 by Clydie Braun, RN Outcome: Progressing 11/25/2022 1303 by Clydie Braun, RN Outcome: Progressing   Problem: Elimination: Goal: Will not experience complications related to bowel motility 11/25/2022 1303 by Clydie Braun, RN Outcome: Progressing 11/25/2022 1303 by Clydie Braun, RN Outcome:  Progressing Goal: Will not experience complications related to urinary retention 11/25/2022 1303 by Clydie Braun, RN Outcome: Progressing 11/25/2022 1303 by Clydie Braun, RN Outcome: Progressing   Problem: Pain Managment: Goal: General experience of comfort will improve 11/25/2022 1303 by Clydie Braun, RN Outcome: Progressing 11/25/2022 1303 by Clydie Braun, RN Outcome: Progressing   Problem: Safety: Goal: Ability to remain free from injury will improve 11/25/2022 1303 by Clydie Braun, RN Outcome: Progressing 11/25/2022 1303 by Clydie Braun, RN Outcome: Progressing   Problem: Skin Integrity: Goal: Risk for impaired skin integrity will decrease 11/25/2022 1303 by Clydie Braun, RN Outcome: Progressing 11/25/2022 1303 by Clydie Braun, RN Outcome: Progressing

## 2022-11-25 NOTE — TOC Initial Note (Addendum)
Transition of Care [facility]) - Initial/Assessment Note    Patient Details  Name: Mariah [name] MRN: [id number] Date of Birth: 01-16-1951  [facility] CM/SW Contact:    Santiana Glidden A [place], Theresia Majors Phone Number: 11/25/2022, 1:16 PM  Clinical Narrative:                 TOC met with pt at bedside. CSW informed her that she was be able to discharge back to facility, [facility] tomorrow if provider said she was still medically stable. She nodded ok. CSW contacted facility, spoke with nurse Leavy Cella, who said she could accept pt on Saturday if DC.   No other TOC needs at this time. Please contact TOC if other needs arise.   Expected Discharge Plan: Long Term Nursing Home Barriers to Discharge: Continued Medical Work up   Patient Goals and CMS Choice            Expected Discharge Plan and Services       Living arrangements for the past 2 months: Skilled Nursing Facility                                      Prior Living Arrangements/Services Living arrangements for the past 2 months: Skilled Nursing Facility Lives with:: Facility Resident          Need for Family Participation in Patient Care: No (Comment) Care giver support system in place?: Yes (comment)   Criminal Activity/Legal Involvement Pertinent to Current Situation/Hospitalization: No - Comment as needed  Activities of Daily Living Home Assistive Devices/Equipment: Wheelchair ADL Screening (condition at time of admission) Patient's cognitive ability adequate to safely complete daily activities?: No Is the patient deaf or have difficulty hearing?: No Does the patient have difficulty seeing, even when wearing glasses/contacts?: No Does the patient have difficulty concentrating, remembering, or making decisions?: Yes Patient able to express need for assistance with ADLs?: Yes Does the patient have difficulty dressing or bathing?: Yes Independently performs ADLs?: No Communication: Independent Dressing  (OT): Needs assistance Grooming: Needs assistance Feeding: Independent Bathing: Dependent Toileting: Dependent In/Out Bed: Dependent Walks in Home: Dependent Does the patient have difficulty walking or climbing stairs?: Yes Weakness of Legs: Both Weakness of Arms/Hands: Left  Permission Sought/Granted                  Emotional Assessment Appearance:: Appears older than stated age Attitude/Demeanor/Rapport: Lethargic Affect (typically observed): Appropriate Orientation: : Oriented to Self Alcohol / Substance Use: Not Applicable Psych Involvement: No (comment)  Admission diagnosis:  Pneumonia [J18.9] Thyroid mass [E07.9] AKI (acute kidney injury) (HCC) [N17.9] Altered mental status, unspecified altered mental status type [R41.82] Pneumonia of left lower lobe due to infectious organism [J18.9] Patient Active Problem List   Diagnosis Date Noted   Obesity 11/23/2022   HTN (hypertension) 11/23/2022   Pneumonia 11/23/2022   Acute metabolic encephalopathy 11/23/2022   Change in consistency of stool 10/12/2021   GAD (generalized anxiety disorder) 10/12/2021   Type II diabetes mellitus (HCC) 02/18/2021   Lower extremity edema 09/06/2019   PVD (peripheral vascular disease) (HCC) 06/27/2019   Pituitary macroadenoma with extrasellar extension (HCC) 06/27/2019   Chronic venous insufficiency 05/01/2019   Need for vaccination 05/01/2019   Advance care planning 03/07/2019   Osteoarthritis of knees, bilateral 02/05/2019   Seizure disorder (HCC) 02/05/2019   Vitamin D deficiency 10/10/2018   Normochromic anemia 09/27/2018   Neurocognitive deficits 09/27/2018  Leukocytosis 06/07/2018   Altered mental status    CKD (chronic kidney disease), stage III (HCC) 06/02/2018   Cellulitis of leg, left 06/01/2018   Acute encephalopathy 10/30/2017   Postmenopausal bleeding 09/01/2017   Screening for colon cancer 09/01/2017   Thyroid nodule 09/01/2017   Daytime sleepiness 08/15/2017    Bilateral knee pain 08/15/2017   Screening for breast cancer 05/25/2017   NAFLD (nonalcoholic fatty liver disease) 40/98/1191   Morbid obesity (HCC) 10/21/2012   Type 2 diabetes mellitus treated without insulin (HCC) 09/18/2012   Chronic venous stasis dermatitis of both lower extremities 07/10/2008   Hypertension associated with diabetes (HCC) 04/26/2007   Allergic rhinitis 04/26/2007   Asthma 04/26/2007   GERD (gastroesophageal reflux disease) 04/26/2007   PCP:  Marinda Elk, MD Pharmacy:   [facility] [place], [place] - [address] [place] [address] Phone: [phone] Fax: [phone]     Social Determinants of Health (SDOH) Social History: SDOH Screenings   Food Insecurity: No Food Insecurity (11/23/2022)  Housing: Low Risk  (11/23/2022)  Transportation Needs: No Transportation Needs (11/23/2022)  Utilities: Not At Risk (11/23/2022)  Depression (PHQ2-9): Low Risk  (09/13/2021)  Tobacco Use: Low Risk  (11/23/2022)   SDOH Interventions:     Readmission Risk Interventions     No data to display

## 2022-11-25 NOTE — Evaluation (Signed)
Occupational Therapy Evaluation Patient Details Name: Mariah [name] MRN: [id number] DOB: [birth year] Today's Date: 11/25/2022   History of Present Illness 72 yo female admitted from SNF with AMS. CT shows pneumonitis and pleural effusions. Pt workup underway for PNA with secondary sepsis and acute metabolic encephalopathy PMH asthma, chronic venous stasis, thyroid nodule, obesity, anemia, CKA, dementia hx of falls, encephalopathy seizure DM2   Clinical Impression   PT admitted with sepsis with acute medabolic encephalopathy secondary to PNA. Pt currently with functional limitiations due to the deficits listed below (see OT problem list). Pt is from [facility] where she uses a rollator indep in her room and completed basic transfers. Pt is able to self toilet without (A) from staff at baseline. Pt uses a w/c in the common areas when out of her room. Staff report that patient likes to remain in her room and sit on the eob dangling throughout the day. Staff must cue her throughout the day to elevate her BIL LE. Staff report that she does not complain of joint pain but does complain of pain in legs with the edema often. Pt this session dependent for bed level care with incontinence of stool and no awareness. Pt complains for L ankle and L wrist pain at the joints. Pt is bed level care due to frequency of incontinence and need for 2 person (A) to safely transfer.  Pt will benefit from skilled OT to increase their independence and safety with adls and balance to allow discharge skilled inpatient follow up therapy, <3 hours/day. .       Recommendations for follow up therapy are one component of a multi-disciplinary discharge planning process, led by the attending physician.  Recommendations may be updated based on patient status, additional functional criteria and insurance authorization.   Assistance Recommended at Discharge Intermittent Supervision/Assistance  Patient can return home with the following  Two people to help with walking and/or transfers;Two people to help with bathing/dressing/bathroom    Functional Status Assessment  Patient has had a recent decline in their functional status and demonstrates the ability to make significant improvements in function in a reasonable and predictable amount of time.  Equipment Recommendations  Other (comment) (has DME at SNF currently)    Recommendations for Other Services       Precautions / Restrictions Precautions Precautions: Fall      Mobility Bed Mobility Overal bed mobility: Needs Assistance Bed Mobility: Rolling Rolling: +2 for physical assistance, Max assist         General bed mobility comments: pt requires staff to complete rolling. pt calling out with position change and input to tactile input at the legs    Transfers                   General transfer comment: deferred due to diarrhea      Balance                                           ADL either performed or assessed with clinical judgement   ADL Overall ADL's : Needs assistance/impaired Eating/Feeding: Minimal assistance   Grooming: Moderate assistance   Upper Body Bathing: Moderate assistance   Lower Body Bathing: Maximal assistance   Upper Body Dressing : Moderate assistance   Lower Body Dressing: Maximal assistance  General ADL Comments: pt incontinent on arrival with diarrhea     Vision   Additional Comments: difficult to fully assess but patient tracking in R and L side of the bed. pt is tracking staff in the room     Perception     Praxis      Pertinent Vitals/Pain Pain Assessment Pain Assessment: Faces Faces Pain Scale: Hurts whole lot Pain Location: ankles, R wrist and any tactile to calf area on R LE Pain Descriptors / Indicators: Discomfort, Grimacing, Guarding Pain Intervention(s): Monitored during session, Premedicated before session, Repositioned, Limited activity within  patient's tolerance     Hand Dominance Right   Extremity/Trunk Assessment Upper Extremity Assessment Upper Extremity Assessment: Generalized weakness;RUE deficits/detail;LUE deficits/detail RUE Deficits / Details: using R UE to guard L UE and hold LUE Deficits / Details: wrist in flexion with complaint of pain, Pt AAROM elbow flexion WFL and should flexion. Pt noted to have scapula elevation due to discomfort of R wrist, pt able to completely make a fist LUE Coordination: decreased fine motor   Lower Extremity Assessment Lower Extremity Assessment: Generalized weakness;Defer to PT evaluation   Cervical / Trunk Assessment Cervical / Trunk Assessment: Kyphotic   Communication     Cognition Arousal/Alertness: Awake/alert Behavior During Therapy: Flat affect Overall Cognitive Status: History of cognitive impairments - at baseline                                       General Comments  high risk for skin break down. barrrier cream applied due to frequency of incontience at this time    Exercises     Shoulder Instructions      Home Living Family/patient expects to be discharged to:: Skilled nursing facility                                 Additional Comments: unable to provide any PTA information. Noted from chart review from SNF level care.      Prior Functioning/Environment                          OT Problem List: Decreased strength;Decreased activity tolerance;Impaired balance (sitting and/or standing);Decreased safety awareness;Decreased knowledge of precautions;Decreased knowledge of use of DME or AE;Pain;Obesity      OT Treatment/Interventions: Self-care/ADL training;Therapeutic exercise;Energy conservation;DME and/or AE instruction;Manual therapy;Therapeutic activities;Patient/family education;Balance training    OT Goals(Current goals can be found in the care plan section) Acute Rehab OT Goals Patient Stated Goal: none  stated by patient OT Goal Formulation: Patient unable to participate in goal setting Time For Goal Achievement: 12/09/22 Potential to Achieve Goals: Fair  OT Frequency: Min 1X/week    Co-evaluation              AM-PAC OT "6 Clicks" Daily Activity     Outcome Measure Help from another person eating meals?: A Little Help from another person taking care of personal grooming?: A Little Help from another person toileting, which includes using toliet, bedpan, or urinal?: A Lot Help from another person bathing (including washing, rinsing, drying)?: A Lot Help from another person to put on and taking off regular upper body clothing?: A Lot Help from another person to put on and taking off regular lower body clothing?: Total 6 Click Score: 13   End of Session Nurse  Communication: Mobility status;Precautions  Activity Tolerance: Patient tolerated treatment well Patient left: in bed;with call bell/phone within reach;with bed alarm set  OT Visit Diagnosis: Unsteadiness on feet (R26.81)                Time: 1117-1140 OT Time Calculation (min): 23 min Charges:  OT General Charges $OT Visit: 1 Visit OT Evaluation $OT Eval Moderate Complexity: 1 Mod   Brynn, OTR/L  Acute Rehabilitation Services Office: [phone] .   Mateo Flow 11/25/2022, 11:55 AM

## 2022-11-26 DIAGNOSIS — G9341 Metabolic encephalopathy: Secondary | ICD-10-CM | POA: Diagnosis not present

## 2022-11-26 DIAGNOSIS — J189 Pneumonia, unspecified organism: Secondary | ICD-10-CM | POA: Diagnosis not present

## 2022-11-26 DIAGNOSIS — I872 Venous insufficiency (chronic) (peripheral): Secondary | ICD-10-CM | POA: Diagnosis not present

## 2022-11-26 DIAGNOSIS — J45909 Unspecified asthma, uncomplicated: Secondary | ICD-10-CM | POA: Diagnosis not present

## 2022-11-26 LAB — CBC
HCT: 23.7 % — ABNORMAL LOW (ref 36.0–46.0)
Hemoglobin: 7.4 g/dL — ABNORMAL LOW (ref 12.0–15.0)
MCH: 27.4 pg (ref 26.0–34.0)
MCHC: 31.2 g/dL (ref 30.0–36.0)
MCV: 87.8 fL (ref 80.0–100.0)
Platelets: 491 10*3/uL — ABNORMAL HIGH (ref 150–400)
RBC: 2.7 MIL/uL — ABNORMAL LOW (ref 3.87–5.11)
RDW: 15.7 % — ABNORMAL HIGH (ref 11.5–15.5)
WBC: 9.5 10*3/uL (ref 4.0–10.5)
nRBC: 0.2 % (ref 0.0–0.2)

## 2022-11-26 LAB — BASIC METABOLIC PANEL
Anion gap: 7 (ref 5–15)
BUN: 22 mg/dL (ref 8–23)
CO2: 20 mmol/L — ABNORMAL LOW (ref 22–32)
Calcium: 8.1 mg/dL — ABNORMAL LOW (ref 8.9–10.3)
Chloride: 110 mmol/L (ref 98–111)
Creatinine, Ser: 2.21 mg/dL — ABNORMAL HIGH (ref 0.44–1.00)
GFR, Estimated: 23 mL/min — ABNORMAL LOW (ref >60)
Glucose, Bld: 76 mg/dL (ref 70–99)
Potassium: 3.8 mmol/L (ref 3.5–5.1)
Sodium: 137 mmol/L (ref 135–145)

## 2022-11-26 LAB — GLUCOSE, CAPILLARY: Glucose-Capillary: 99 mg/dL (ref 70–99)

## 2022-11-26 LAB — MAGNESIUM: Magnesium: 2 mg/dL (ref 1.7–2.4)

## 2022-11-26 LAB — CULTURE, BLOOD (ROUTINE X 2): Culture: NO GROWTH

## 2022-11-26 MED ORDER — GLUCERNA SHAKE PO LIQD: 237.0000 mL | Freq: Two times a day (BID) | ORAL | 0 refills | Status: DC

## 2022-11-26 MED ORDER — CEFDINIR 300 MG PO CAPS: 300.0000 mg | ORAL_CAPSULE | Freq: Every day | ORAL | 0 refills | Status: AC

## 2022-11-26 MED ORDER — POLYETHYLENE GLYCOL 3350 17 G PO PACK: 17.0000 g | PACK | Freq: Every day | ORAL | 0 refills | Status: DC | PRN

## 2022-11-26 MED ORDER — AZITHROMYCIN 500 MG PO TABS: 500.0000 mg | ORAL_TABLET | Freq: Every day | ORAL | 0 refills | Status: AC

## 2022-11-26 MED ORDER — ADULT MULTIVITAMIN W/MINERALS CH: 1.0000 | ORAL_TABLET | Freq: Every day | ORAL | Status: DC

## 2022-11-26 NOTE — Discharge Summary (Signed)
Physician Discharge Summary  KORTLYN TAKANO VWU:[id number] DOB: 01-30-1951 DOA: 11/23/2022  PCP: Marinda Elk, MD  Admit date: 11/23/2022 Discharge date: 11/26/2022  Admitted From: Skilled nursing facility  Discharge disposition: Skilled nursing facility  Recommendations for Outpatient Follow-Up:   Follow up with your primary care provider at the skilled nursing facility in 3 to 5 days. Check CBC, BMP, magnesium in the next visit   Discharge Diagnosis:   Principal Problem:   Pneumonia Active Problems:   Asthma   GERD (gastroesophageal reflux disease)   Chronic venous stasis dermatitis of both lower extremities   Type 2 diabetes mellitus treated without insulin (HCC)   Type II diabetes mellitus (HCC)   Obesity   HTN (hypertension)   Acute metabolic encephalopathy   Discharge Condition: Improved.  Diet recommendation: Low sodium, heart healthy.  Carbohydrate-modified.   Wound care: None.  Code status: Full.   History of Present Illness:   Patient is a 72 years old female with past medical history of asthma, chronic venous stasis, thyroid nodule, obesity, anemia, CKD, dementia, history of falls, encephalopathy, seizure disorder, vitamin D deficiency, DM2, migraines, UTI, pneumonia presented to the hospital from skilled nursing facility with altered mental status with increased cough.  Patient was more fatigued and disoriented than usual. In the ED,patient was afebrile.  COVID influenza and RSV negative.  Labs showed leukocytosis at 14 K, anemia with hemoglobin of 8.2, thrombocytosis, creatinine elevated at 2.60, lactate at 1.2.  Urinalysis was negative.  INR was elevated at 1.7.  CT scan of the chest showed pneumonitis.  Mild colitis reported as well.  Small layering pleural effusions and diffuse bronchial thickening.  Patient received Rocephin and Zithromax, Ringer lactate in the ED and was admitted to the hospital for further evaluation and treatment.    Hospital  Course:   Following conditions were addressed during hospitalization as listed below,  Acute metabolic encephalopathy likely secondary to pneumonia.  On the background of dementia.Urinalysis was negative.  Patient febrile with a temperature of 100.9 F on presentation..  Improving.  Currently at baseline.   Sepsis secondary to pneumonia.  Patient received IV Rocephin and Zithromax.  Negative blood cultures in 3 days.  Negative streptococcal urinary and Legionella urinary antigen.  Co. cytosis has trended down and has normalized to 9.5.  History of asthma, Continue bronchodilators.  Closely monitor.  Compensated.   History of hypertension.  Continue losartan and hydralazine.   Mild AKI on chronic kidney disease,  Creatinine today at 2.2 from initial 2.6.  Last creatinine of 1.9, 3 months back.  Will need to monitoring as outpatient.   History of seizure disorder, Continue Keppra.   Diabetes mellitus type 2, Last hemoglobin A1c of 5.9, 1-year back.  Patient is on metformin as outpatient.  Continue on discharge.  Diabetic diet recommended.   History of fall and debility. Patient has been at the skilled nursing facility for the last 5 years.  Plan for skilled nursing facility on discharge.    Multiple episodes of diarrhea.  C. difficile was negative.  Received the loperamide with improvement.  Could consider loperamide if needed on discharge.   Obesity. Body mass index is 31.62 kg/m.  Would benefit from continued weight loss as outpatient.   Disposition.  At this time, patient is stable for disposition back to skilled nursing facility  Medical Consultants:   None.  Procedures:    None Subjective:   Today, patient was seen and examined at bedside.  Denies any nausea  vomiting fever chills or rigor.  Remains stable.  Discharge Exam:   Vitals:   11/26/22 0626 11/26/22 0759  BP: (!) 109/53 116/71  Pulse: 96 92  Resp: 17 19  Temp: 99.9 F (37.7 C) 98.9 F (37.2 C)  SpO2:  100% 100%   Vitals:   11/25/22 2020 11/25/22 2121 11/26/22 0626 11/26/22 0759  BP: 105/67  (!) 109/53 116/71  Pulse: 82  96 92  Resp: 16  17 19   Temp: 98.3 F (36.8 C)  99.9 F (37.7 C) 98.9 F (37.2 C)  TempSrc:    Oral  SpO2: 100% 99% 100% 100%  Weight:       Body mass index is 31.62 kg/m.  General: Alert awake, not in obvious distress, obese, Communicative, obese HENT: pupils equally reacting to light,  No scleral pallor or icterus noted. Oral mucosa is moist.  Chest:  Diminished breath sounds bilaterally. No crackles or wheezes.  CVS: S1 &S2 heard. No murmur.  Regular rate and rhythm. Abdomen: Soft, nontender, nondistended.  Bowel sounds are heard.   Extremities: No cyanosis, clubbing with bilateral chronic venous stasis changes. Psych: Alert, awake and Communicative, has underlying dementia CNS:  No cranial nerve deficits.  Power equal in all extremities.   Skin: Warm and dry.  No rashes noted.  The results of significant diagnostics from this hospitalization (including imaging, microbiology, ancillary and laboratory) are listed below for reference.     Diagnostic Studies:   CT CHEST ABDOMEN PELVIS WO CONTRAST  Result Date: 11/23/2022 CLINICAL DATA:  Abnormal x-ray. Lung opacities and coughing. Concern for pneumonia. Leukocytosis. EXAM: CT CHEST, ABDOMEN AND PELVIS WITHOUT CONTRAST TECHNIQUE: Multidetector CT imaging of the chest, abdomen and pelvis was performed following the standard protocol without IV contrast. RADIATION DOSE REDUCTION: This exam was performed according to the departmental dose-optimization program which includes automated exposure control, adjustment of the mA and/or kV according to patient size and/or use of iterative reconstruction technique. COMPARISON:  PA and lateral chest today and 07/27/2022, and CTA chest, abdomen and pelvis 02/06/2016 FINDINGS: CT CHEST FINDINGS Cardiovascular: The cardiac size is normal. There is a small chronic pericardial  effusion to the right anteriorly. There are no visible coronary calcifications. There is aortic atherosclerosis without aneurysm with normal great vessel branching and normal caliber pulmonary veins and arteries. Mediastinum/Nodes: There is heterogeneous 4.2 cm mass of the thyroid isthmus and left lobe which has previously been recommended for biopsy as of the last file ultrasound from 09/26/2017. There is no record of biopsy having been performed. If this was not performed elsewhere then biopsy should be considered at this time. Small hiatal hernia. There is mild thickening of the distal thoracic esophagus probably due to esophagitis and most likely reflux related. Shotty subcentimeter short axis prevascular and right paratracheal lymph nodes are unchanged. No intrathoracic or axillary adenopathy is seen. Lungs/Pleura: There are small symmetric layering pleural effusions new since the prior study. There are mild posterior atelectatic changes. There is diffuse bronchial thickening without bronchiectasis or bronchial plugging. Subpleural septal prominence is seen in the lung bases and could be due to interstitial edema or interstitial pneumonitis. The lungs are otherwise clear. Musculoskeletal: There is osteopenia, degenerative changes of the spine and slight thoracic dextroscoliosis. The ribcage is intact. CT ABDOMEN PELVIS FINDINGS Hepatobiliary: There is homogeneous noncontrast attenuation of the liver. No mass is seen without contrast. Multiple stones interval developed in the gallbladder but no wall thickening or biliary dilatation. Pancreas: Unremarkable without contrast. Spleen: Unremarkable without contrast.  Adrenals/Urinary Tract: There is no adrenal mass. No focal abnormality in the unenhanced renal cortex. There are occasional punctate nonobstructive caliceal stones in the left kidney but no nephrolithiasis seen on the right. There are no obstructing stones or hydroureteronephrosis on either side. There is  no bladder thickening. The superior bladder is indented by the enlarged lobular fibroid uterus. Stomach/Bowel: Small hiatal hernia. Otherwise unremarkable stomach and small bowel. The appendix is normal. Small bowel again noted extending out lateral to the ascending colon consistent with a nonobstructive transmesenteric internal hernia. The ascending and proximal transverse colon are unremarkable apart from retained stool. There is mild wall thickening and stranding consistent with colitis in the distal transverse colon sparing the splenic flexure. Beginning in the mid third of the descending colon and continuing through most of the sigmoid segment, there is wall thickening and and more prominent inflammatory stranding consistent with additional colitis. No pneumatosis is seen. There are diverticula over portions of the affected colon, but the length of involvement would be atypical for diverticulitis. Infectious or inflammatory colitis are more likely. Ischemic etiology is less likely given the distribution. The rectal wall is unremarkable. Vascular/Lymphatic: Aortic atherosclerosis. No enlarged abdominal or pelvic lymph nodes. Borderline prominent external iliac chain and bilateral inguinal borderline sized lymph nodes are unchanged in the interval since 2017. No other adenopathy is seen. There is mild aortoiliac calcification. No significant visceral branch vessel calcific plaques including the SMA and IMA, but no arterial patency is not established with a noncontrast exam. Reproductive: Enlarged fibroid uterus is again noted with lobulation, multiple calcified fibroids and superior mass effect on the bladder. The ovaries do not appear enlarged. Other: There is mild body wall edema in the flanks and thighs. There is no bowel pneumatosis. There is trace reactive fluid in left pericolic gutter but no other free fluid. There is no free air, abscess or free hemorrhage. There are no incarcerated hernias. Small  umbilical fat hernia. Musculoskeletal: There is ankylosis across the SI joints anteriorly with bridging osteophytes. There is pelvic enthesopathy. Osteopenia and degenerative change lumbar spine. There is advanced L4-5 facet hypertrophy with grade 1 degenerative anterolisthesis. IMPRESSION: 1. Small layering pleural effusions with basilar subpleural septal thickening which could be due to interstitial edema or interstitial pneumonitis. The cardiac size is normal. 2. Diffuse bronchial thickening without bronchiectasis or bronchial plugging. 3. Aortic atherosclerosis. 4. Small hiatal hernia with probable reflux esophagitis in the distal thoracic esophagus. 5. There is no record of thyroid biopsy having been performed. If this was not performed elsewhere then biopsy should be considered at this time for the 4.2 cm thyroid mass noted today and previously. 6. Distal transverse colitis, with separate mid/distal descending and sigmoid colitis, most likely infectious or inflammatory. Ischemic etiology is less likely given the distribution. There are diverticula over portions of the affected colon, but the length of involvement would be atypical for diverticulitis. 7. Nonobstructive left micronephrolithiasis. 8. Cholelithiasis. 9. Nonobstructive transmesenteric internal hernia, unchanged. 10. Enlarged fibroid uterus. 11. Body wall edema in the flanks and thighs. Aortic Atherosclerosis (ICD10-I70.0). Electronically Signed   By: Almira Bar M.D.   On: 11/23/2022 06:15   DG Hand Complete Left  Result Date: 11/23/2022 CLINICAL DATA:  Dementia pain EXAM: LEFT HAND - COMPLETE 3+ VIEW COMPARISON:  None Available. FINDINGS: No fracture or malalignment. Chronic appearing deformity of distal scaphoid. Mild degenerative arthritis at the first [facility] joint, second MCP joint and at the distal IP joint. IMPRESSION: No acute osseous abnormality Electronically Signed  By: Jasmine Pang M.D.   On: 11/23/2022 03:32   DG Wrist Complete  Left  Result Date: 11/23/2022 CLINICAL DATA:  Wrist pain EXAM: LEFT WRIST - COMPLETE 3+ VIEW COMPARISON:  None Available. FINDINGS: No fracture or malalignment. Degenerative changes at the first [facility] joint. Possible chronic deformity of the scaphoid. Diffuse wrist swelling. IMPRESSION: No acute osseous abnormality. Degenerative changes at the first [facility] joint. Possible chronic deformity of the scaphoid. Electronically Signed   By: Jasmine Pang M.D.   On: 11/23/2022 03:31   DG Chest 2 View  Result Date: 11/23/2022 CLINICAL DATA:  Suspected sepsis EXAM: CHEST - 2 VIEW COMPARISON:  07/27/2022 FINDINGS: Low lung volumes. Cardiomegaly with diffuse interstitial opacity most likely representing pulmonary edema. Possible small pleural effusions. Aortic atherosclerosis. No focal airspace disease or pneumothorax IMPRESSION: Cardiomegaly with diffuse interstitial opacity most likely representing pulmonary edema. Possible small pleural effusions. Electronically Signed   By: Jasmine Pang M.D.   On: 11/23/2022 03:29     Labs:   Basic Metabolic Panel: Recent Labs  Lab 11/23/22 0252 11/24/22 0757 11/25/22 0753 11/26/22 0507  NA 142 140 136 137  K 4.3 3.7 3.5 3.8  CL 111 110 109 110  CO2 21* 21* 19* 20*  GLUCOSE 79 76 80 76  BUN 36* 27* 20 22  CREATININE 2.68* 2.38* 2.27* 2.21*  CALCIUM 8.6* 8.0* 7.9* 8.1*  MG  --  1.9 1.8 2.0   GFR CrCl cannot be calculated (Unknown ideal weight.). Liver Function Tests: Recent Labs  Lab 11/23/22 0252 11/24/22 0757  AST 19 19  ALT 12 11  ALKPHOS 74 62  BILITOT 0.6 0.5  PROT 7.5 6.5  ALBUMIN 1.9* 1.5*   No results for input(s): "LIPASE", "AMYLASE" in the last 168 hours. No results for input(s): "AMMONIA" in the last 168 hours. Coagulation profile Recent Labs  Lab 11/23/22 0252  INR 1.7*    CBC: Recent Labs  Lab 11/23/22 0252 11/24/22 0757 11/25/22 0753 11/26/22 0507  WBC 14.0* 11.6* 10.2 9.5  NEUTROABS 11.7*  --   --   --   HGB 8.2* 7.2* 7.0*  7.4*  HCT 27.7* 23.6* 23.6* 23.7*  MCV 92.0 88.7 91.5 87.8  PLT 581* 520* 491* 491*   Cardiac Enzymes: No results for input(s): "CKTOTAL", "CKMB", "CKMBINDEX", "TROPONINI" in the last 168 hours. BNP: Invalid input(s): "POCBNP" CBG: Recent Labs  Lab 11/25/22 0752 11/25/22 1233 11/25/22 1609 11/25/22 1928 11/26/22 0818  GLUCAP 72 115* 80 109* 99   D-Dimer No results for input(s): "DDIMER" in the last 72 hours. Hgb A1c No results for input(s): "HGBA1C" in the last 72 hours. Lipid Profile No results for input(s): "CHOL", "HDL", "LDLCALC", "TRIG", "CHOLHDL", "LDLDIRECT" in the last 72 hours. Thyroid function studies No results for input(s): "TSH", "T4TOTAL", "T3FREE", "THYROIDAB" in the last 72 hours.  Invalid input(s): "FREET3" Anemia work up No results for input(s): "VITAMINB12", "FOLATE", "FERRITIN", "TIBC", "IRON", "RETICCTPCT" in the last 72 hours. Microbiology Recent Results (from the past 240 hour(s))  Culture, blood (Routine x 2)     Status: None (Preliminary result)   Collection Time: 11/23/22  2:50 AM   Specimen: BLOOD LEFT ARM  Result Value Ref Range Status   Specimen Description BLOOD LEFT ARM  Final   Special Requests   Final    BOTTLES DRAWN AEROBIC AND ANAEROBIC Blood Culture adequate volume   Culture   Final    NO GROWTH 2 DAYS Performed at [facility], [address]  [place], [place] [address]    Report Status PENDING  Incomplete  Culture, blood (Routine x 2)     Status: None (Preliminary result)   Collection Time: 11/23/22  2:55 AM   Specimen: BLOOD RIGHT ARM  Result Value Ref Range Status   Specimen Description BLOOD RIGHT ARM  Final   Special Requests   Final    BOTTLES DRAWN AEROBIC AND ANAEROBIC Blood Culture adequate volume   Culture   Final    NO GROWTH 2 DAYS Performed at [facility], [address], [place], [place] [address]    Report Status PENDING  Incomplete  Resp panel by RT-PCR (RSV, Flu A&B, Covid) Anterior Nasal Swab      Status: None   Collection Time: 11/23/22  3:27 AM   Specimen: Anterior Nasal Swab  Result Value Ref Range Status   SARS Coronavirus 2 by RT PCR NEGATIVE NEGATIVE Final   Influenza A by PCR NEGATIVE NEGATIVE Final   Influenza B by PCR NEGATIVE NEGATIVE Final    Comment: (NOTE) The Xpert Xpress SARS-CoV-2/FLU/RSV plus assay is intended as an aid in the diagnosis of influenza from Nasopharyngeal swab specimens and should not be used as a sole basis for treatment. Nasal washings and aspirates are unacceptable for Xpert Xpress SARS-CoV-2/FLU/RSV testing.  Fact Sheet for Patients: BloggerCourse.com  Fact Sheet for Healthcare Providers: SeriousBroker.it  This test is not yet approved or cleared by the [place] FDA and has been authorized for detection and/or diagnosis of SARS-CoV-2 by FDA under an Emergency Use Authorization (EUA). This EUA will remain in effect (meaning this test can be used) for the duration of the COVID-19 declaration under Section 564(b)(1) of the Act, 21 U.S.C. section 360bbb-3(b)(1), unless the authorization is terminated or revoked.     Resp Syncytial Virus by PCR NEGATIVE NEGATIVE Final    Comment: (NOTE) Fact Sheet for Patients: BloggerCourse.com  Fact Sheet for Healthcare Providers: SeriousBroker.it  This test is not yet approved or cleared by the [place] FDA and has been authorized for detection and/or diagnosis of SARS-CoV-2 by FDA under an Emergency Use Authorization (EUA). This EUA will remain in effect (meaning this test can be used) for the duration of the COVID-19 declaration under Section 564(b)(1) of the Act, 21 U.S.C. section 360bbb-3(b)(1), unless the authorization is terminated or revoked.  Performed at [facility], [address] [place], [place] [address]   C Difficile Quick Screen w PCR reflex     Status: None    Collection Time: 11/24/22  1:19 PM   Specimen: STOOL  Result Value Ref Range Status   C Diff antigen NEGATIVE NEGATIVE Final   C Diff toxin NEGATIVE NEGATIVE Final   C Diff interpretation No C. difficile detected.  Final    Comment: Performed at [facility], [address] [place], [place] [address]     Discharge Instructions:   Discharge Instructions     Call MD for:  difficulty breathing, headache or visual disturbances   Complete by: As directed    Call MD for:  temperature >100.4   Complete by: As directed    Diet - low sodium heart healthy   Complete by: As directed    Discharge instructions   Complete by: As directed    Follow-up with your primary care provider at the skilled nursing facility in 3 to 5 days.  Check CBC BMP at that time.  Complete the course of antibiotic.   Increase activity slowly  Complete by: As directed       Allergies as of 11/26/2022       Reactions   Penicillins Hives, Other (See Comments)   ++ got keflex in 2013 and 2019++ Has patient had a PCN reaction causing immediate rash, facial/tongue/throat swelling, SOB or lightheadedness with hypotension: No Has patient had a PCN reaction causing severe rash involving mucus membranes or skin necrosis: No PATIENT HAS HAD A PCN REACTION THAT REQUIRED HOSPITALIZATION:  #  #  YES  #  #  Has patient had a PCN reaction occurring within the last 10 years: No   Naproxen    History of GI intolerance with indomethacin.  She is on PPI for GERD.  NSAIDs should not be used long-term or as maintenance medication due to the comorbidities and age as per Beers' List   Codeine Nausea And Vomiting, Other (See Comments)   Indomethacin Diarrhea        Medication List     STOP taking these medications    cephALEXin 500 MG capsule Commonly known as: KEFLEX       TAKE these medications    acetaminophen 650 MG CR tablet Commonly known as: TYLENOL Take 650 mg by mouth every 6 (six) hours as needed for  pain.   Advair Diskus 100-50 MCG/ACT Aepb Generic drug: fluticasone-salmeterol Inhale 1 puff into the lungs 2 (two) times daily.   albuterol 108 (90 Base) MCG/ACT inhaler Commonly known as: VENTOLIN HFA Inhale 2 puffs into the lungs every 6 (six) hours as needed for wheezing or shortness of breath.   ascorbic acid 500 MG tablet Commonly known as: VITAMIN C Take 500 mg by mouth 2 (two) times daily.   azithromycin 500 MG tablet Commonly known as: Zithromax Take 1 tablet (500 mg total) by mouth daily for 3 days.   barrier cream Crea Commonly known as: non-specified Apply 1 application topically 2 (two) times daily as needed.   bisacodyl 10 MG suppository Commonly known as: DULCOLAX Constipation (2 of 4): If not relieved by MOM, give 10 mg Bisacodyl suppositiory rectally X 1 dose in 24 hours as needed (Do not use constipation standing orders for residents with renal failure/CFR less than 30. Contact MD for orders)   cefdinir 300 MG capsule Commonly known as: OMNICEF Take 1 capsule (300 mg total) by mouth daily for 3 days.   cetaphil cream Apply 1 application topically at bedtime. Apply to bilateral lower legs for dry skin.   feeding supplement (GLUCERNA SHAKE) Liqd Take 237 mLs by mouth 2 (two) times daily between meals.   ferrous sulfate 325 (65 FE) MG tablet Take 325 mg by mouth in the morning and at bedtime.   furosemide 40 MG tablet Commonly known as: LASIX TAKE ONE TABLET BY MOUTH EVERY DAY   guaiFENesin 600 MG 12 hr tablet Commonly known as: MUCINEX Take 600 mg by mouth 2 (two) times daily.   hydrALAZINE 25 MG tablet Commonly known as: APRESOLINE Take 25 mg by mouth 3 (three) times daily.   levETIRAcetam 500 MG tablet Commonly known as: KEPPRA Take 1 tablet (500 mg total) by mouth 2 (two) times daily.   loperamide 2 MG tablet Commonly known as: IMODIUM A-D Take 2 mg by mouth as needed for diarrhea or loose stools. Give after each stool prn, not to exceed 8  mg in 24 hours   losartan 100 MG tablet Commonly known as: COZAAR Take 100 mg by mouth daily.   magnesium hydroxide 400 MG/5ML suspension  Commonly known as: MILK OF MAGNESIA Constipation (1 of 4): If no BM in 3 days, give 30 cc Milk of Magnesium p.o. x 1 dose in 24 hours as needed (Do not use standing constipation orders for residents with renal failure CFR less than 30. Contact MD for orders)   metoprolol succinate 50 MG 24 hr tablet Commonly known as: TOPROL-XL Take 50 mg by mouth 2 (two) times daily.   mometasone 50 MCG/ACT nasal spray Commonly known as: NASONEX Place 1 spray into the nose as needed. What changed:  when to take this reasons to take this   multivitamin with minerals Tabs tablet Take 1 tablet by mouth daily.   NON FORMULARY DIET: REGULAR, THIN LIQUIDS   polyethylene glycol 17 g packet Commonly known as: MIRALAX / GLYCOLAX Take 17 g by mouth daily as needed for mild constipation or moderate constipation.   promethazine 25 MG tablet Commonly known as: PHENERGAN Take 25 mg by mouth every 6 (six) hours as needed for nausea or vomiting.   senna-docusate 8.6-50 MG tablet Commonly known as: Senokot-S Take 1 tablet by mouth 2 (two) times daily as needed for mild constipation.   Vitamin D3 125 MCG (5000 UT) Caps Take 1 capsule by mouth daily.        Follow-up Information     Demarchi, Dionne Ano, MD Follow up in 1 week(s).   Specialty: Family Medicine Contact information: [phone] [place] [address] [phone]                  Time coordinating discharge: 39 minutes  Signed:  Makia Bossi  [facility] 11/26/2022, 9:04 AM

## 2022-11-26 NOTE — Progress Notes (Signed)
Attempted to call report to [facility]. Phone rang, no answer. Will try to call report in 15 minutes

## 2022-11-26 NOTE — TOC Transition Note (Signed)
Transition of Care [facility]) - CM/SW Discharge Note   Patient Details  Name: Mariah [name] MRN: [id number] Date of Birth: 03-01-1951  [facility] CM/SW Contact:  Deatra Robinson, [place] Phone Number: 11/26/2022, 12:00 PM   Clinical Narrative:  Pt for dc back to [facility] where she is a LTC resident. Spoke to [place] in admissions who confirmed pt able to return. Pt's niece Rodney Booze notified of dc and reports agreeable. RN provided with number for report and [facility] arranged for transport. SW signing off at dc.   Dellie Burns, MSW, LCSW [phone] (coverage)      Final next level of care: Skilled Nursing Facility Barriers to Discharge: No Barriers Identified   Patient Goals and CMS Choice      Discharge Placement                  Patient to be transferred to facility by: PTAR Name of family member notified: Tasha/niece Patient and family notified of of transfer: 11/26/22  Discharge Plan and Services Additional resources added to the After Visit Summary for                                       Social Determinants of Health (SDOH) Interventions SDOH Screenings   Food Insecurity: No Food Insecurity (11/23/2022)  Housing: Low Risk  (11/23/2022)  Transportation Needs: No Transportation Needs (11/23/2022)  Utilities: Not At Risk (11/23/2022)  Depression (PHQ2-9): Low Risk  (09/13/2021)  Tobacco Use: Low Risk  (11/23/2022)     Readmission Risk Interventions     No data to display

## 2022-11-26 NOTE — Progress Notes (Signed)
Patient is being prepared for discharge. She tolerated all meds well. Held hydralazine due to soft blood pressure (all other vitals are WNL). No pain or distress noted or observed. Patient's blood sugar required no insulin. Tolerated breakfast well. Plan of care is ongoing.   Lawana Pai, RN

## 2022-11-26 NOTE — Progress Notes (Signed)
Attempted to call report to [facility] home again. Unable to give report.

## 2022-11-28 LAB — CULTURE, BLOOD (ROUTINE X 2): Special Requests: ADEQUATE

## 2023-05-26 DEATH — deceased
